# Patient Record
Sex: Female | Born: 1940
Health system: Southern US, Community
[De-identification: ages and names within clinical notes are randomized; demographics above are authoritative.]

## PROBLEM LIST (undated history)

## (undated) DIAGNOSIS — M7989 Other specified soft tissue disorders: Secondary | ICD-10-CM

## (undated) DIAGNOSIS — IMO0001 Reserved for inherently not codable concepts without codable children: Secondary | ICD-10-CM

## (undated) DIAGNOSIS — I10 Essential (primary) hypertension: Secondary | ICD-10-CM

## (undated) DIAGNOSIS — N289 Disorder of kidney and ureter, unspecified: Secondary | ICD-10-CM

## (undated) DIAGNOSIS — M1990 Unspecified osteoarthritis, unspecified site: Secondary | ICD-10-CM

## (undated) DIAGNOSIS — F329 Major depressive disorder, single episode, unspecified: Secondary | ICD-10-CM

## (undated) DIAGNOSIS — F32A Depression, unspecified: Secondary | ICD-10-CM

## (undated) DIAGNOSIS — K469 Unspecified abdominal hernia without obstruction or gangrene: Secondary | ICD-10-CM

## (undated) DIAGNOSIS — Z8719 Personal history of other diseases of the digestive system: Secondary | ICD-10-CM

## (undated) DIAGNOSIS — D509 Iron deficiency anemia, unspecified: Secondary | ICD-10-CM

## (undated) DIAGNOSIS — E611 Iron deficiency: Secondary | ICD-10-CM

## (undated) DIAGNOSIS — N189 Chronic kidney disease, unspecified: Secondary | ICD-10-CM

## (undated) DIAGNOSIS — E785 Hyperlipidemia, unspecified: Secondary | ICD-10-CM

## (undated) DIAGNOSIS — I739 Peripheral vascular disease, unspecified: Secondary | ICD-10-CM

## (undated) DIAGNOSIS — L659 Nonscarring hair loss, unspecified: Secondary | ICD-10-CM

## (undated) DIAGNOSIS — J189 Pneumonia, unspecified organism: Secondary | ICD-10-CM

## (undated) DIAGNOSIS — I509 Heart failure, unspecified: Secondary | ICD-10-CM

## (undated) DIAGNOSIS — D631 Anemia in chronic kidney disease: Secondary | ICD-10-CM

## (undated) HISTORY — DX: Depression, unspecified: F32.A

## (undated) HISTORY — PX: HERNIA REPAIR: SHX51

## (undated) HISTORY — DX: Personal history of other diseases of the digestive system: Z87.19

## (undated) HISTORY — DX: Other specified soft tissue disorders: M79.89

## (undated) HISTORY — DX: Major depressive disorder, single episode, unspecified: F32.9

## (undated) HISTORY — DX: Nonscarring hair loss, unspecified: L65.9

## (undated) HISTORY — PX: ABDOMINAL HYSTERECTOMY: SHX81

## (undated) HISTORY — DX: Essential (primary) hypertension: I10

## (undated) HISTORY — DX: Hyperlipidemia, unspecified: E78.5

## (undated) HISTORY — DX: Chronic kidney disease, unspecified: N18.9

## (undated) HISTORY — DX: Anemia in chronic kidney disease: D63.1

---

## 1997-08-13 ENCOUNTER — Encounter: Admission: RE | Admit: 1997-08-13 | Discharge: 1997-11-11 | Payer: Self-pay | Admitting: *Deleted

## 1997-09-04 ENCOUNTER — Encounter: Admission: RE | Admit: 1997-09-04 | Discharge: 1997-09-04 | Payer: Self-pay | Admitting: Family Medicine

## 1997-11-14 ENCOUNTER — Encounter: Admission: RE | Admit: 1997-11-14 | Discharge: 1997-11-14 | Payer: Self-pay | Admitting: Family Medicine

## 1998-03-03 ENCOUNTER — Ambulatory Visit (HOSPITAL_COMMUNITY): Admission: RE | Admit: 1998-03-03 | Discharge: 1998-03-03 | Payer: Self-pay | Admitting: *Deleted

## 1998-04-17 ENCOUNTER — Encounter: Admission: RE | Admit: 1998-04-17 | Discharge: 1998-04-17 | Payer: Self-pay | Admitting: Family Medicine

## 1998-05-18 ENCOUNTER — Emergency Department (HOSPITAL_COMMUNITY): Admission: EM | Admit: 1998-05-18 | Discharge: 1998-05-18 | Payer: Self-pay | Admitting: Emergency Medicine

## 1998-05-22 ENCOUNTER — Encounter: Admission: RE | Admit: 1998-05-22 | Discharge: 1998-05-22 | Payer: Self-pay | Admitting: Family Medicine

## 1998-05-29 ENCOUNTER — Encounter: Admission: RE | Admit: 1998-05-29 | Discharge: 1998-05-29 | Payer: Self-pay | Admitting: Family Medicine

## 1998-06-09 ENCOUNTER — Encounter: Admission: RE | Admit: 1998-06-09 | Discharge: 1998-06-09 | Payer: Self-pay | Admitting: Family Medicine

## 1998-06-10 ENCOUNTER — Encounter: Admission: RE | Admit: 1998-06-10 | Discharge: 1998-06-10 | Payer: Self-pay | Admitting: Sports Medicine

## 1998-06-12 ENCOUNTER — Ambulatory Visit (HOSPITAL_COMMUNITY): Admission: RE | Admit: 1998-06-12 | Discharge: 1998-06-12 | Payer: Self-pay | Admitting: *Deleted

## 1999-01-24 ENCOUNTER — Emergency Department (HOSPITAL_COMMUNITY): Admission: EM | Admit: 1999-01-24 | Discharge: 1999-01-24 | Payer: Self-pay | Admitting: Emergency Medicine

## 1999-01-24 ENCOUNTER — Encounter: Payer: Self-pay | Admitting: Emergency Medicine

## 1999-06-01 ENCOUNTER — Emergency Department (HOSPITAL_COMMUNITY): Admission: EM | Admit: 1999-06-01 | Discharge: 1999-06-01 | Payer: Self-pay | Admitting: Emergency Medicine

## 1999-06-01 ENCOUNTER — Encounter: Payer: Self-pay | Admitting: Emergency Medicine

## 1999-06-15 ENCOUNTER — Ambulatory Visit (HOSPITAL_COMMUNITY): Admission: RE | Admit: 1999-06-15 | Discharge: 1999-06-15 | Payer: Self-pay | Admitting: Family Medicine

## 1999-06-15 ENCOUNTER — Encounter: Payer: Self-pay | Admitting: Family Medicine

## 2000-02-10 ENCOUNTER — Encounter: Payer: Self-pay | Admitting: Urology

## 2000-02-10 ENCOUNTER — Encounter: Admission: RE | Admit: 2000-02-10 | Discharge: 2000-02-10 | Payer: Self-pay | Admitting: Urology

## 2000-02-17 ENCOUNTER — Other Ambulatory Visit: Admission: RE | Admit: 2000-02-17 | Discharge: 2000-02-17 | Payer: Self-pay | Admitting: Urology

## 2000-06-16 ENCOUNTER — Encounter: Payer: Self-pay | Admitting: Family Medicine

## 2000-06-16 ENCOUNTER — Ambulatory Visit (HOSPITAL_COMMUNITY): Admission: RE | Admit: 2000-06-16 | Discharge: 2000-06-16 | Payer: Self-pay | Admitting: Family Medicine

## 2001-03-09 ENCOUNTER — Other Ambulatory Visit: Admission: RE | Admit: 2001-03-09 | Discharge: 2001-03-09 | Payer: Self-pay | Admitting: Urology

## 2001-07-12 ENCOUNTER — Ambulatory Visit (HOSPITAL_COMMUNITY): Admission: RE | Admit: 2001-07-12 | Discharge: 2001-07-12 | Payer: Self-pay | Admitting: Family Medicine

## 2001-09-13 ENCOUNTER — Ambulatory Visit (HOSPITAL_COMMUNITY): Admission: RE | Admit: 2001-09-13 | Discharge: 2001-09-13 | Payer: Self-pay | Admitting: Internal Medicine

## 2002-03-09 ENCOUNTER — Emergency Department (HOSPITAL_COMMUNITY): Admission: EM | Admit: 2002-03-09 | Discharge: 2002-03-09 | Payer: Self-pay | Admitting: Emergency Medicine

## 2002-03-09 ENCOUNTER — Encounter: Payer: Self-pay | Admitting: Emergency Medicine

## 2002-07-16 ENCOUNTER — Ambulatory Visit (HOSPITAL_COMMUNITY): Admission: RE | Admit: 2002-07-16 | Discharge: 2002-07-16 | Payer: Self-pay | Admitting: Family Medicine

## 2002-10-18 ENCOUNTER — Ambulatory Visit (HOSPITAL_COMMUNITY): Admission: RE | Admit: 2002-10-18 | Discharge: 2002-10-18 | Payer: Self-pay | Admitting: Internal Medicine

## 2002-10-31 ENCOUNTER — Emergency Department (HOSPITAL_COMMUNITY): Admission: EM | Admit: 2002-10-31 | Discharge: 2002-10-31 | Payer: Self-pay | Admitting: Emergency Medicine

## 2002-10-31 ENCOUNTER — Encounter: Payer: Self-pay | Admitting: Emergency Medicine

## 2002-11-14 ENCOUNTER — Emergency Department (HOSPITAL_COMMUNITY): Admission: EM | Admit: 2002-11-14 | Discharge: 2002-11-15 | Payer: Self-pay

## 2003-08-26 ENCOUNTER — Ambulatory Visit (HOSPITAL_COMMUNITY): Admission: RE | Admit: 2003-08-26 | Discharge: 2003-08-26 | Payer: Self-pay | Admitting: Nurse Practitioner

## 2004-01-17 ENCOUNTER — Ambulatory Visit: Payer: Self-pay | Admitting: *Deleted

## 2004-02-20 ENCOUNTER — Ambulatory Visit: Payer: Self-pay | Admitting: Nurse Practitioner

## 2004-06-03 ENCOUNTER — Ambulatory Visit: Payer: Self-pay | Admitting: Nurse Practitioner

## 2004-07-22 ENCOUNTER — Emergency Department (HOSPITAL_COMMUNITY): Admission: EM | Admit: 2004-07-22 | Discharge: 2004-07-22 | Payer: Self-pay | Admitting: Family Medicine

## 2004-07-29 ENCOUNTER — Emergency Department (HOSPITAL_COMMUNITY): Admission: EM | Admit: 2004-07-29 | Discharge: 2004-07-29 | Payer: Self-pay | Admitting: Family Medicine

## 2004-08-16 ENCOUNTER — Emergency Department (HOSPITAL_COMMUNITY): Admission: EM | Admit: 2004-08-16 | Discharge: 2004-08-16 | Payer: Self-pay | Admitting: Family Medicine

## 2004-08-17 ENCOUNTER — Ambulatory Visit: Payer: Self-pay | Admitting: Nurse Practitioner

## 2004-08-20 ENCOUNTER — Ambulatory Visit: Payer: Self-pay | Admitting: Nurse Practitioner

## 2004-08-21 ENCOUNTER — Ambulatory Visit (HOSPITAL_COMMUNITY): Admission: RE | Admit: 2004-08-21 | Discharge: 2004-08-21 | Payer: Self-pay | Admitting: Nurse Practitioner

## 2004-08-21 ENCOUNTER — Ambulatory Visit (HOSPITAL_COMMUNITY): Admission: RE | Admit: 2004-08-21 | Discharge: 2004-08-21 | Payer: Self-pay | Admitting: Internal Medicine

## 2004-08-27 ENCOUNTER — Ambulatory Visit (HOSPITAL_COMMUNITY): Admission: RE | Admit: 2004-08-27 | Discharge: 2004-08-27 | Payer: Self-pay | Admitting: Internal Medicine

## 2004-08-28 ENCOUNTER — Ambulatory Visit: Payer: Self-pay | Admitting: Family Medicine

## 2004-09-08 ENCOUNTER — Encounter: Admission: RE | Admit: 2004-09-08 | Discharge: 2004-09-08 | Payer: Self-pay | Admitting: Family Medicine

## 2004-09-09 ENCOUNTER — Encounter: Payer: Self-pay | Admitting: Cardiology

## 2004-09-09 ENCOUNTER — Ambulatory Visit (HOSPITAL_COMMUNITY): Admission: RE | Admit: 2004-09-09 | Discharge: 2004-09-09 | Payer: Self-pay | Admitting: Sports Medicine

## 2004-09-09 ENCOUNTER — Ambulatory Visit: Payer: Self-pay | Admitting: Cardiology

## 2004-09-25 ENCOUNTER — Ambulatory Visit: Payer: Self-pay | Admitting: Family Medicine

## 2004-10-30 ENCOUNTER — Ambulatory Visit: Payer: Self-pay | Admitting: Family Medicine

## 2004-12-02 ENCOUNTER — Encounter: Admission: RE | Admit: 2004-12-02 | Discharge: 2004-12-02 | Payer: Self-pay | Admitting: Family Medicine

## 2004-12-02 ENCOUNTER — Ambulatory Visit: Payer: Self-pay | Admitting: Family Medicine

## 2004-12-02 IMAGING — CR DG KNEE 1-2V*R*
2 series · 2 of 2 positions shown · non-contrast
Comparison: none

CLINICAL DATA: Lateral knee pain. 
 RIGHT KNEE ? 2 VIEW:
 Two views of the right knee show considerable loss of joint space laterally with some loss at the patellofemoral articulation as well.  There is sclerosis and spur formation primarily laterally.  No effusion is seen.

[t knee ap right]
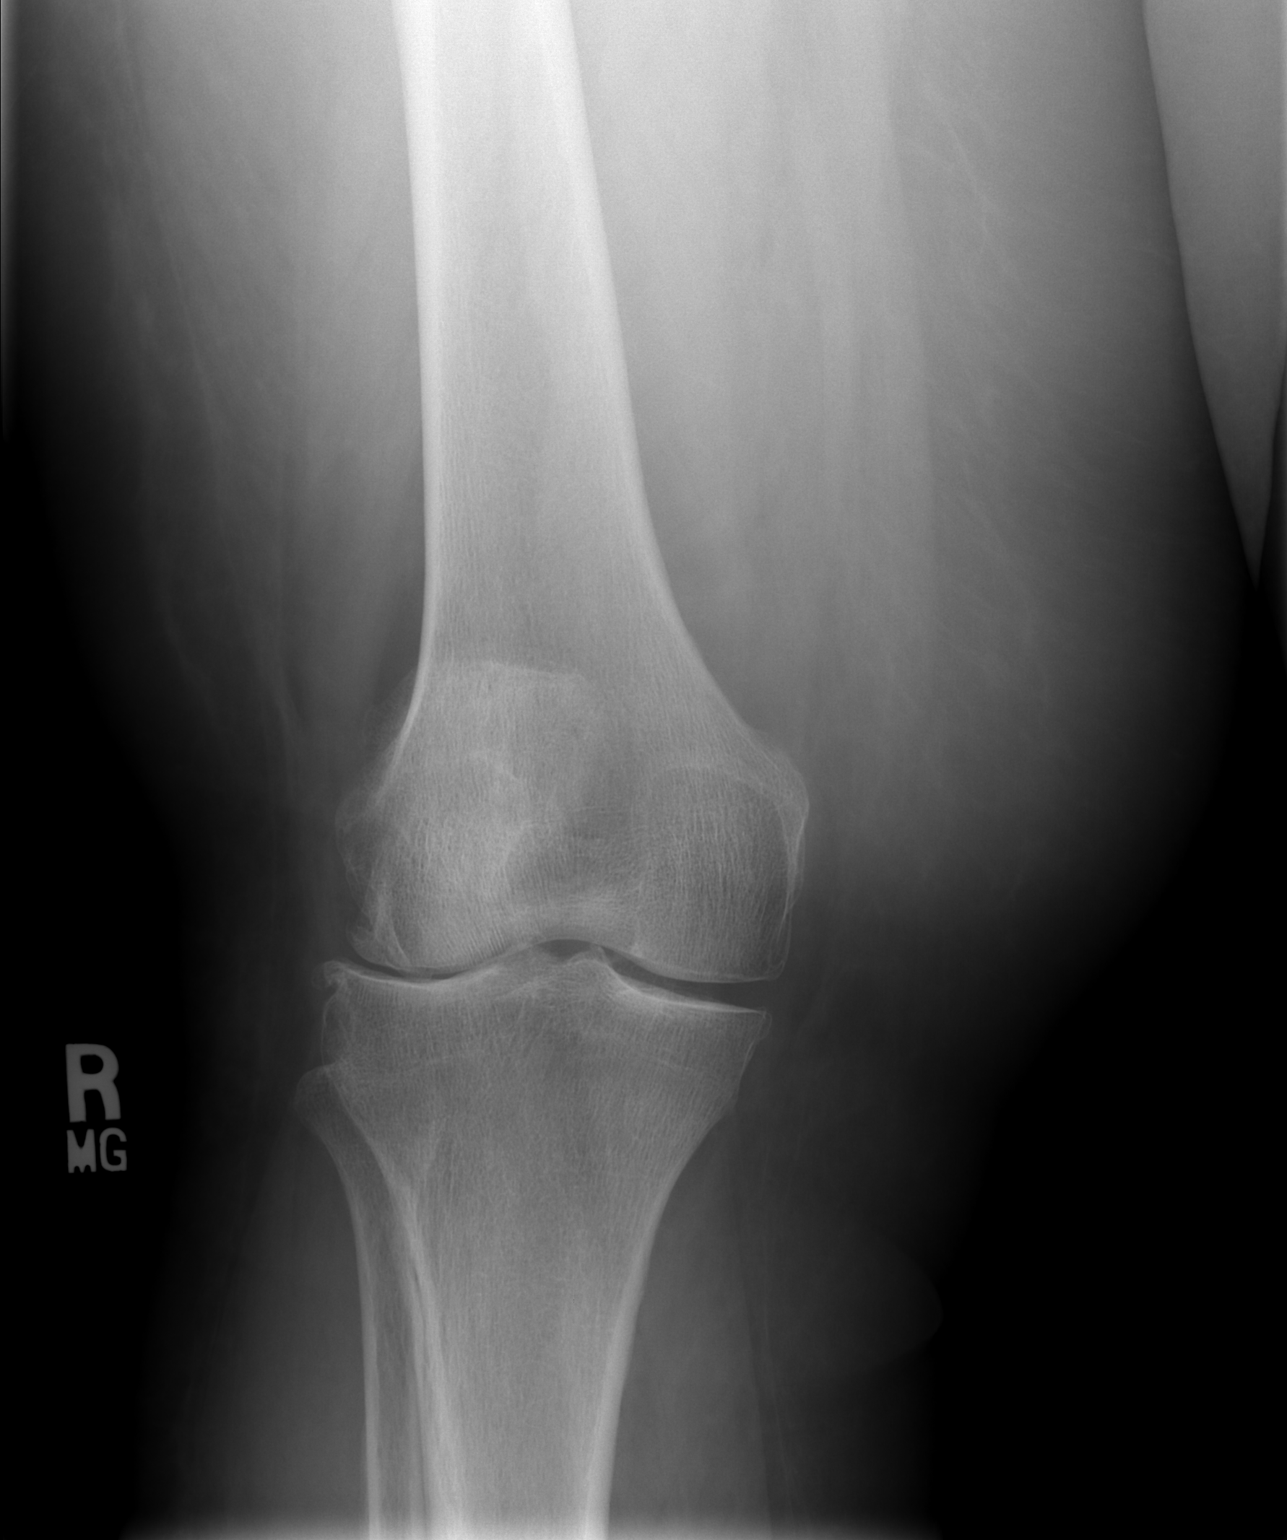

[t knee lat right]
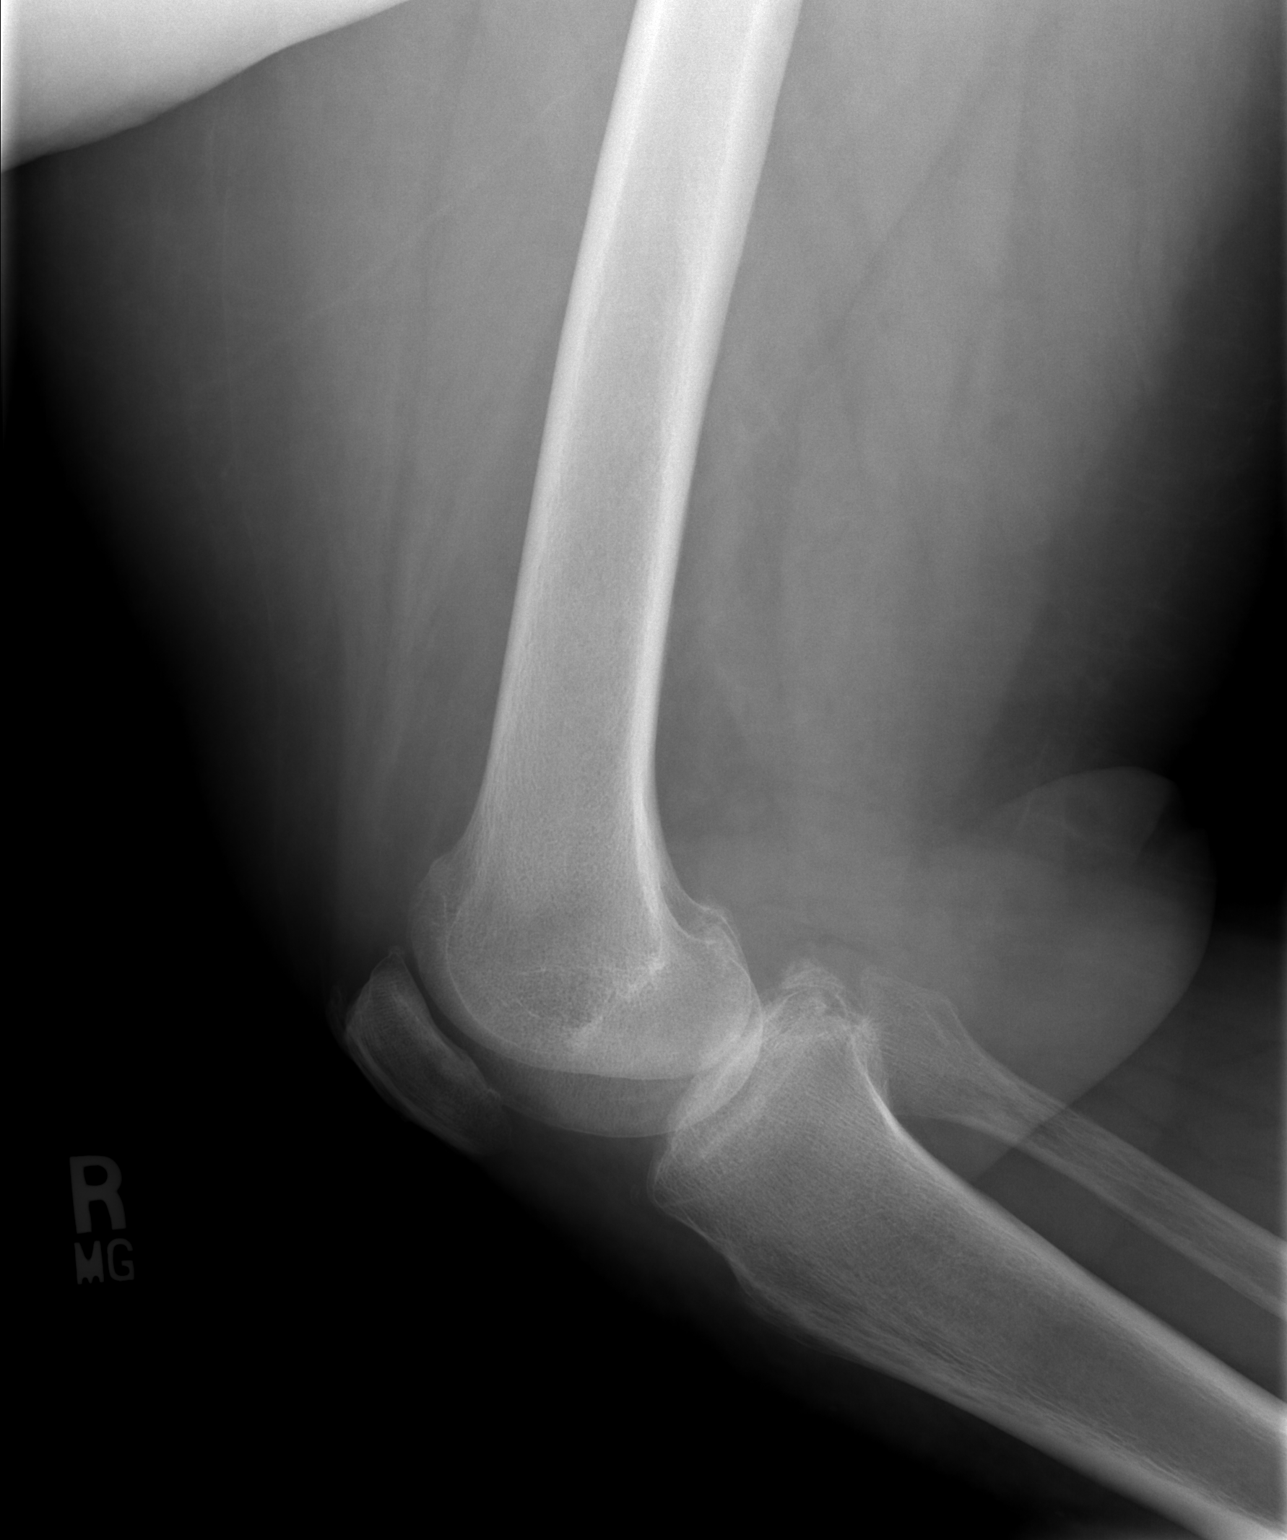

[2 of 2 positions shown; findings below may reference images not displayed]

IMPRESSION: Degenerative changes particularly laterally.  No acute abnormality.

## 2004-12-24 ENCOUNTER — Ambulatory Visit: Payer: Self-pay | Admitting: Family Medicine

## 2005-01-13 ENCOUNTER — Ambulatory Visit: Payer: Self-pay | Admitting: Sports Medicine

## 2005-01-26 ENCOUNTER — Ambulatory Visit: Payer: Self-pay | Admitting: Sports Medicine

## 2005-02-15 ENCOUNTER — Ambulatory Visit: Payer: Self-pay | Admitting: Sports Medicine

## 2005-03-17 ENCOUNTER — Ambulatory Visit: Payer: Self-pay | Admitting: Family Medicine

## 2005-04-28 ENCOUNTER — Ambulatory Visit: Payer: Self-pay | Admitting: Family Medicine

## 2005-05-12 ENCOUNTER — Ambulatory Visit: Payer: Self-pay | Admitting: Family Medicine

## 2005-05-28 ENCOUNTER — Ambulatory Visit: Payer: Self-pay | Admitting: Family Medicine

## 2005-06-23 ENCOUNTER — Ambulatory Visit: Payer: Self-pay | Admitting: Family Medicine

## 2005-07-02 ENCOUNTER — Ambulatory Visit: Payer: Self-pay | Admitting: Family Medicine

## 2005-07-07 ENCOUNTER — Ambulatory Visit: Payer: Self-pay | Admitting: Family Medicine

## 2005-07-08 ENCOUNTER — Ambulatory Visit: Payer: Self-pay | Admitting: Family Medicine

## 2005-07-26 ENCOUNTER — Ambulatory Visit: Payer: Self-pay | Admitting: Family Medicine

## 2005-08-02 ENCOUNTER — Ambulatory Visit: Payer: Self-pay | Admitting: Family Medicine

## 2005-08-13 ENCOUNTER — Ambulatory Visit: Payer: Self-pay | Admitting: Family Medicine

## 2005-09-15 ENCOUNTER — Ambulatory Visit: Payer: Self-pay | Admitting: Family Medicine

## 2005-09-23 ENCOUNTER — Emergency Department (HOSPITAL_COMMUNITY): Admission: EM | Admit: 2005-09-23 | Discharge: 2005-09-23 | Payer: Self-pay | Admitting: Emergency Medicine

## 2005-09-27 ENCOUNTER — Ambulatory Visit: Payer: Self-pay | Admitting: Sports Medicine

## 2005-09-29 ENCOUNTER — Ambulatory Visit (HOSPITAL_COMMUNITY): Admission: RE | Admit: 2005-09-29 | Discharge: 2005-09-29 | Payer: Self-pay | Admitting: Family Medicine

## 2005-09-30 ENCOUNTER — Ambulatory Visit: Payer: Self-pay | Admitting: Family Medicine

## 2005-11-10 ENCOUNTER — Ambulatory Visit: Payer: Self-pay | Admitting: Sports Medicine

## 2005-11-17 ENCOUNTER — Ambulatory Visit: Payer: Self-pay | Admitting: Family Medicine

## 2005-11-18 ENCOUNTER — Encounter: Admission: RE | Admit: 2005-11-18 | Discharge: 2005-11-18 | Payer: Self-pay | Admitting: Sports Medicine

## 2005-11-25 ENCOUNTER — Ambulatory Visit: Payer: Self-pay | Admitting: Family Medicine

## 2005-11-25 ENCOUNTER — Ambulatory Visit (HOSPITAL_COMMUNITY): Admission: RE | Admit: 2005-11-25 | Discharge: 2005-11-25 | Payer: Self-pay | Admitting: Family Medicine

## 2005-12-09 ENCOUNTER — Encounter: Admission: RE | Admit: 2005-12-09 | Discharge: 2005-12-09 | Payer: Self-pay | Admitting: Pediatrics

## 2005-12-15 ENCOUNTER — Ambulatory Visit: Payer: Self-pay | Admitting: Family Medicine

## 2006-01-06 ENCOUNTER — Encounter: Admission: RE | Admit: 2006-01-06 | Discharge: 2006-01-06 | Payer: Self-pay | Admitting: Sports Medicine

## 2006-02-11 ENCOUNTER — Ambulatory Visit: Payer: Self-pay | Admitting: Family Medicine

## 2006-03-03 ENCOUNTER — Ambulatory Visit: Payer: Self-pay | Admitting: Sports Medicine

## 2006-03-29 ENCOUNTER — Ambulatory Visit: Payer: Self-pay | Admitting: Sports Medicine

## 2006-04-13 ENCOUNTER — Ambulatory Visit: Payer: Self-pay | Admitting: Family Medicine

## 2006-04-25 ENCOUNTER — Encounter: Admission: RE | Admit: 2006-04-25 | Discharge: 2006-04-25 | Payer: Self-pay | Admitting: Nephrology

## 2006-04-28 ENCOUNTER — Ambulatory Visit: Payer: Self-pay | Admitting: Family Medicine

## 2006-05-19 ENCOUNTER — Ambulatory Visit: Payer: Self-pay | Admitting: Family Medicine

## 2006-06-09 ENCOUNTER — Ambulatory Visit: Payer: Self-pay | Admitting: Family Medicine

## 2006-06-16 ENCOUNTER — Encounter (INDEPENDENT_AMBULATORY_CARE_PROVIDER_SITE_OTHER): Payer: Self-pay | Admitting: Family Medicine

## 2006-06-16 ENCOUNTER — Ambulatory Visit: Payer: Self-pay | Admitting: Family Medicine

## 2006-06-16 DIAGNOSIS — I871 Compression of vein: Secondary | ICD-10-CM

## 2006-06-16 DIAGNOSIS — N189 Chronic kidney disease, unspecified: Secondary | ICD-10-CM

## 2006-06-16 DIAGNOSIS — D631 Anemia in chronic kidney disease: Secondary | ICD-10-CM

## 2006-06-16 DIAGNOSIS — I10 Essential (primary) hypertension: Secondary | ICD-10-CM

## 2006-06-16 DIAGNOSIS — N19 Unspecified kidney failure: Secondary | ICD-10-CM | POA: Insufficient documentation

## 2006-06-16 DIAGNOSIS — E118 Type 2 diabetes mellitus with unspecified complications: Secondary | ICD-10-CM

## 2006-06-16 DIAGNOSIS — E785 Hyperlipidemia, unspecified: Secondary | ICD-10-CM

## 2006-06-16 HISTORY — DX: Anemia in chronic kidney disease: D63.1

## 2006-06-16 HISTORY — DX: Chronic kidney disease, unspecified: N18.9

## 2006-06-16 LAB — CONVERTED CEMR LAB
HDL: 61 mg/dL (ref >39)
LDL Cholesterol: 74 mg/dL (ref 0–99)
Total CHOL/HDL Ratio: 2.5
Triglycerides: 87 mg/dL (ref <150)

## 2006-07-11 ENCOUNTER — Telehealth (INDEPENDENT_AMBULATORY_CARE_PROVIDER_SITE_OTHER): Payer: Self-pay | Admitting: Family Medicine

## 2006-07-14 ENCOUNTER — Ambulatory Visit: Payer: Self-pay | Admitting: Sports Medicine

## 2006-07-14 LAB — CONVERTED CEMR LAB: Cholesterol, target level: 200 mg/dL

## 2006-08-17 ENCOUNTER — Ambulatory Visit: Payer: Self-pay | Admitting: Family Medicine

## 2006-08-17 ENCOUNTER — Encounter (INDEPENDENT_AMBULATORY_CARE_PROVIDER_SITE_OTHER): Payer: Self-pay | Admitting: Family Medicine

## 2006-08-17 DIAGNOSIS — G5600 Carpal tunnel syndrome, unspecified upper limb: Secondary | ICD-10-CM | POA: Insufficient documentation

## 2006-08-17 DIAGNOSIS — H409 Unspecified glaucoma: Secondary | ICD-10-CM | POA: Insufficient documentation

## 2006-08-17 DIAGNOSIS — L659 Nonscarring hair loss, unspecified: Secondary | ICD-10-CM | POA: Insufficient documentation

## 2006-08-17 LAB — CONVERTED CEMR LAB
BUN: 21 mg/dL (ref 6–23)
Calcium: 9.1 mg/dL (ref 8.4–10.5)
HCT: 32.9 %
Hemoglobin: 11.1 g/dL
MCV: 89 fL
Potassium: 4.4 meq/L (ref 3.5–5.3)
Sodium: 140 meq/L (ref 135–145)
TSH: 3.066 microintl units/mL (ref 0.350–5.50)
Vitamin B-12: 418 pg/mL (ref 211–911)

## 2006-08-18 ENCOUNTER — Encounter (INDEPENDENT_AMBULATORY_CARE_PROVIDER_SITE_OTHER): Payer: Self-pay | Admitting: Family Medicine

## 2006-08-25 ENCOUNTER — Ambulatory Visit: Payer: Self-pay | Admitting: Family Medicine

## 2006-08-29 ENCOUNTER — Ambulatory Visit: Payer: Self-pay | Admitting: Family Medicine

## 2006-09-05 ENCOUNTER — Encounter (INDEPENDENT_AMBULATORY_CARE_PROVIDER_SITE_OTHER): Payer: Self-pay | Admitting: Hospitalist

## 2006-09-05 ENCOUNTER — Telehealth: Payer: Self-pay | Admitting: *Deleted

## 2006-09-16 ENCOUNTER — Telehealth (INDEPENDENT_AMBULATORY_CARE_PROVIDER_SITE_OTHER): Payer: Self-pay | Admitting: Family Medicine

## 2006-09-19 ENCOUNTER — Ambulatory Visit: Payer: Self-pay | Admitting: Family Medicine

## 2006-09-19 ENCOUNTER — Telehealth (INDEPENDENT_AMBULATORY_CARE_PROVIDER_SITE_OTHER): Payer: Self-pay | Admitting: *Deleted

## 2006-09-21 ENCOUNTER — Encounter: Admission: RE | Admit: 2006-09-21 | Discharge: 2006-09-21 | Payer: Self-pay | Admitting: Sports Medicine

## 2006-09-29 ENCOUNTER — Ambulatory Visit: Payer: Self-pay | Admitting: Sports Medicine

## 2006-10-03 ENCOUNTER — Encounter (INDEPENDENT_AMBULATORY_CARE_PROVIDER_SITE_OTHER): Payer: Self-pay | Admitting: Family Medicine

## 2006-10-05 ENCOUNTER — Encounter (INDEPENDENT_AMBULATORY_CARE_PROVIDER_SITE_OTHER): Payer: Self-pay | Admitting: Family Medicine

## 2006-10-05 ENCOUNTER — Ambulatory Visit (HOSPITAL_COMMUNITY): Admission: RE | Admit: 2006-10-05 | Discharge: 2006-10-05 | Payer: Self-pay | Admitting: Sports Medicine

## 2006-10-13 ENCOUNTER — Encounter (INDEPENDENT_AMBULATORY_CARE_PROVIDER_SITE_OTHER): Payer: Self-pay | Admitting: Family Medicine

## 2006-10-13 ENCOUNTER — Emergency Department (HOSPITAL_COMMUNITY): Admission: EM | Admit: 2006-10-13 | Discharge: 2006-10-13 | Payer: Self-pay | Admitting: Emergency Medicine

## 2006-10-14 ENCOUNTER — Telehealth (INDEPENDENT_AMBULATORY_CARE_PROVIDER_SITE_OTHER): Payer: Self-pay | Admitting: Family Medicine

## 2006-10-14 ENCOUNTER — Encounter (INDEPENDENT_AMBULATORY_CARE_PROVIDER_SITE_OTHER): Payer: Self-pay | Admitting: Family Medicine

## 2006-10-19 ENCOUNTER — Ambulatory Visit: Payer: Self-pay | Admitting: Family Medicine

## 2006-10-27 ENCOUNTER — Encounter (INDEPENDENT_AMBULATORY_CARE_PROVIDER_SITE_OTHER): Payer: Self-pay | Admitting: Family Medicine

## 2006-11-21 ENCOUNTER — Encounter (INDEPENDENT_AMBULATORY_CARE_PROVIDER_SITE_OTHER): Payer: Self-pay | Admitting: Family Medicine

## 2006-11-24 ENCOUNTER — Ambulatory Visit: Payer: Self-pay | Admitting: Family Medicine

## 2006-12-01 ENCOUNTER — Ambulatory Visit: Payer: Self-pay | Admitting: Family Medicine

## 2006-12-08 ENCOUNTER — Ambulatory Visit: Payer: Self-pay | Admitting: Family Medicine

## 2006-12-08 DIAGNOSIS — M1990 Unspecified osteoarthritis, unspecified site: Secondary | ICD-10-CM | POA: Insufficient documentation

## 2006-12-08 LAB — CONVERTED CEMR LAB: Hgb A1c MFr Bld: 7.5 %

## 2006-12-28 ENCOUNTER — Encounter (INDEPENDENT_AMBULATORY_CARE_PROVIDER_SITE_OTHER): Payer: Self-pay | Admitting: Family Medicine

## 2007-01-05 ENCOUNTER — Ambulatory Visit: Payer: Self-pay | Admitting: Family Medicine

## 2007-01-05 ENCOUNTER — Encounter (INDEPENDENT_AMBULATORY_CARE_PROVIDER_SITE_OTHER): Payer: Self-pay | Admitting: Family Medicine

## 2007-01-09 ENCOUNTER — Encounter (INDEPENDENT_AMBULATORY_CARE_PROVIDER_SITE_OTHER): Payer: Self-pay | Admitting: Family Medicine

## 2007-01-16 ENCOUNTER — Ambulatory Visit: Payer: Self-pay | Admitting: Family Medicine

## 2007-01-16 LAB — CONVERTED CEMR LAB

## 2007-01-23 ENCOUNTER — Emergency Department (HOSPITAL_COMMUNITY): Admission: EM | Admit: 2007-01-23 | Discharge: 2007-01-23 | Payer: Self-pay | Admitting: Emergency Medicine

## 2007-01-30 ENCOUNTER — Inpatient Hospital Stay (HOSPITAL_COMMUNITY): Admission: EM | Admit: 2007-01-30 | Discharge: 2007-02-13 | Payer: Self-pay | Admitting: Emergency Medicine

## 2007-01-30 ENCOUNTER — Encounter (INDEPENDENT_AMBULATORY_CARE_PROVIDER_SITE_OTHER): Payer: Self-pay | Admitting: Family Medicine

## 2007-02-14 ENCOUNTER — Emergency Department (HOSPITAL_COMMUNITY): Admission: EM | Admit: 2007-02-14 | Discharge: 2007-02-15 | Payer: Self-pay | Admitting: Emergency Medicine

## 2007-02-15 ENCOUNTER — Inpatient Hospital Stay (HOSPITAL_COMMUNITY): Admission: EM | Admit: 2007-02-15 | Discharge: 2007-02-21 | Payer: Self-pay | Admitting: Emergency Medicine

## 2007-02-15 ENCOUNTER — Encounter: Payer: Self-pay | Admitting: Family Medicine

## 2007-02-15 DIAGNOSIS — Z8719 Personal history of other diseases of the digestive system: Secondary | ICD-10-CM

## 2007-02-15 HISTORY — DX: Personal history of other diseases of the digestive system: Z87.19

## 2007-02-22 ENCOUNTER — Encounter (INDEPENDENT_AMBULATORY_CARE_PROVIDER_SITE_OTHER): Payer: Self-pay | Admitting: *Deleted

## 2007-03-01 ENCOUNTER — Encounter: Payer: Self-pay | Admitting: Family Medicine

## 2007-03-01 LAB — CONVERTED CEMR LAB
Time of Second Serial CBG: 1700
Value of Second Serial CBG: 239

## 2007-03-08 ENCOUNTER — Ambulatory Visit: Payer: Self-pay | Admitting: Family Medicine

## 2007-03-08 ENCOUNTER — Encounter: Payer: Self-pay | Admitting: Family Medicine

## 2007-03-24 ENCOUNTER — Telehealth: Payer: Self-pay | Admitting: *Deleted

## 2007-03-30 ENCOUNTER — Encounter (INDEPENDENT_AMBULATORY_CARE_PROVIDER_SITE_OTHER): Payer: Self-pay | Admitting: Family Medicine

## 2007-03-30 ENCOUNTER — Ambulatory Visit: Payer: Self-pay | Admitting: Family Medicine

## 2007-03-30 LAB — CONVERTED CEMR LAB
BUN: 32 mg/dL — ABNORMAL HIGH (ref 6–23)
CO2: 26 meq/L (ref 19–32)
Glucose, Bld: 126 mg/dL — ABNORMAL HIGH (ref 70–99)
Hemoglobin: 10.6 g/dL — ABNORMAL LOW (ref 12.0–15.0)
MCHC: 31 g/dL (ref 30.0–36.0)
Potassium: 4.7 meq/L (ref 3.5–5.3)
RBC: 3.65 M/uL — ABNORMAL LOW (ref 3.87–5.11)
Sodium: 141 meq/L (ref 135–145)
WBC: 7.1 10*3/uL (ref 4.0–10.5)

## 2007-04-05 ENCOUNTER — Encounter (INDEPENDENT_AMBULATORY_CARE_PROVIDER_SITE_OTHER): Payer: Self-pay | Admitting: Family Medicine

## 2007-05-01 ENCOUNTER — Encounter (INDEPENDENT_AMBULATORY_CARE_PROVIDER_SITE_OTHER): Payer: Self-pay | Admitting: *Deleted

## 2007-05-16 ENCOUNTER — Encounter: Admission: RE | Admit: 2007-05-16 | Discharge: 2007-05-16 | Payer: Self-pay | Admitting: Endocrinology

## 2007-06-27 ENCOUNTER — Encounter (INDEPENDENT_AMBULATORY_CARE_PROVIDER_SITE_OTHER): Payer: Self-pay | Admitting: Family Medicine

## 2007-07-10 ENCOUNTER — Ambulatory Visit: Payer: Self-pay | Admitting: Sports Medicine

## 2007-07-10 ENCOUNTER — Encounter (INDEPENDENT_AMBULATORY_CARE_PROVIDER_SITE_OTHER): Payer: Self-pay | Admitting: Family Medicine

## 2007-07-10 DIAGNOSIS — K458 Other specified abdominal hernia without obstruction or gangrene: Secondary | ICD-10-CM

## 2007-07-10 LAB — CONVERTED CEMR LAB
HCT: 33.7 % — ABNORMAL LOW (ref 36.0–46.0)
Hemoglobin: 10.9 g/dL — ABNORMAL LOW (ref 12.0–15.0)
Hgb A1c MFr Bld: 9.2 %
MCHC: 32.3 g/dL (ref 30.0–36.0)
MCV: 91.6 fL (ref 78.0–100.0)
Platelets: 280 10*3/uL (ref 150–400)
RDW: 13.8 % (ref 11.5–15.5)

## 2007-07-12 ENCOUNTER — Ambulatory Visit: Admission: RE | Admit: 2007-07-12 | Discharge: 2007-07-12 | Payer: Self-pay | Admitting: Family Medicine

## 2007-07-12 ENCOUNTER — Ambulatory Visit: Payer: Self-pay | Admitting: Vascular Surgery

## 2007-07-13 ENCOUNTER — Ambulatory Visit: Payer: Self-pay | Admitting: Family Medicine

## 2007-08-04 ENCOUNTER — Ambulatory Visit: Payer: Self-pay | Admitting: Family Medicine

## 2007-08-17 ENCOUNTER — Encounter (INDEPENDENT_AMBULATORY_CARE_PROVIDER_SITE_OTHER): Payer: Self-pay | Admitting: Family Medicine

## 2007-09-07 ENCOUNTER — Encounter: Payer: Self-pay | Admitting: Family Medicine

## 2007-09-07 ENCOUNTER — Ambulatory Visit: Payer: Self-pay | Admitting: Family Medicine

## 2007-10-06 ENCOUNTER — Ambulatory Visit: Payer: Self-pay | Admitting: Family Medicine

## 2007-10-18 ENCOUNTER — Encounter: Payer: Self-pay | Admitting: Family Medicine

## 2007-10-19 ENCOUNTER — Encounter: Payer: Self-pay | Admitting: Family Medicine

## 2007-10-19 ENCOUNTER — Ambulatory Visit (HOSPITAL_COMMUNITY): Admission: RE | Admit: 2007-10-19 | Discharge: 2007-10-19 | Payer: Self-pay | Admitting: Family Medicine

## 2007-10-24 ENCOUNTER — Ambulatory Visit: Payer: Self-pay | Admitting: Family Medicine

## 2007-12-15 ENCOUNTER — Encounter: Admission: RE | Admit: 2007-12-15 | Discharge: 2007-12-15 | Payer: Self-pay | Admitting: General Surgery

## 2007-12-15 ENCOUNTER — Encounter: Payer: Self-pay | Admitting: *Deleted

## 2008-01-23 ENCOUNTER — Encounter: Payer: Self-pay | Admitting: Family Medicine

## 2008-01-24 ENCOUNTER — Telehealth: Payer: Self-pay | Admitting: *Deleted

## 2008-01-29 ENCOUNTER — Inpatient Hospital Stay (HOSPITAL_COMMUNITY): Admission: AD | Admit: 2008-01-29 | Discharge: 2008-02-04 | Payer: Self-pay | Admitting: General Surgery

## 2008-01-29 ENCOUNTER — Encounter (HOSPITAL_BASED_OUTPATIENT_CLINIC_OR_DEPARTMENT_OTHER): Payer: Self-pay | Admitting: General Surgery

## 2008-01-30 ENCOUNTER — Encounter: Payer: Self-pay | Admitting: Family Medicine

## 2008-01-30 LAB — CONVERTED CEMR LAB: Hgb A1c MFr Bld: 9.5 %

## 2008-02-12 ENCOUNTER — Telehealth: Payer: Self-pay | Admitting: Family Medicine

## 2008-02-23 ENCOUNTER — Ambulatory Visit: Payer: Self-pay | Admitting: Family Medicine

## 2008-02-23 ENCOUNTER — Telehealth: Payer: Self-pay | Admitting: *Deleted

## 2008-03-06 ENCOUNTER — Encounter: Payer: Self-pay | Admitting: Family Medicine

## 2008-04-16 ENCOUNTER — Encounter: Payer: Self-pay | Admitting: Family Medicine

## 2008-04-22 ENCOUNTER — Encounter: Payer: Self-pay | Admitting: Family Medicine

## 2008-04-22 LAB — CONVERTED CEMR LAB
Albumin: 4 g/dL
Cholesterol: 149 mg/dL
Ferritin: 30 ng/mL
HDL: 58 mg/dL
Hgb A1c MFr Bld: 8.5 %
Iron: 56 ug/dL
PTH: 52 pg/mL
Saturation Ratios: 21 %
Triglyceride fasting, serum: 188 mg/dL

## 2008-04-30 ENCOUNTER — Telehealth: Payer: Self-pay | Admitting: *Deleted

## 2008-04-30 ENCOUNTER — Encounter: Payer: Self-pay | Admitting: Family Medicine

## 2008-05-01 ENCOUNTER — Ambulatory Visit: Payer: Self-pay | Admitting: Family Medicine

## 2008-05-10 ENCOUNTER — Encounter: Payer: Self-pay | Admitting: Family Medicine

## 2008-06-05 ENCOUNTER — Encounter: Payer: Self-pay | Admitting: Family Medicine

## 2008-06-21 ENCOUNTER — Telehealth: Payer: Self-pay | Admitting: Family Medicine

## 2008-06-21 ENCOUNTER — Telehealth: Payer: Self-pay | Admitting: *Deleted

## 2008-07-24 ENCOUNTER — Telehealth: Payer: Self-pay | Admitting: Family Medicine

## 2008-07-24 ENCOUNTER — Ambulatory Visit: Payer: Self-pay | Admitting: Family Medicine

## 2008-07-24 LAB — CONVERTED CEMR LAB: Hgb A1c MFr Bld: 11.1 %

## 2008-07-29 ENCOUNTER — Encounter: Payer: Self-pay | Admitting: Family Medicine

## 2008-08-14 ENCOUNTER — Ambulatory Visit: Payer: Self-pay | Admitting: Family Medicine

## 2008-08-26 ENCOUNTER — Encounter: Payer: Self-pay | Admitting: Family Medicine

## 2008-08-26 LAB — CONVERTED CEMR LAB
Chloride, Serum: 100 mmol/L
Creatinine, Ser: 1.51 mg/dL
Hemoglobin: 10.5 g/dL
PTH: 73 pg/mL
Phosphorus: 3.6 mg/dL
Potassium, serum: 4.5 mmol/L

## 2008-08-29 ENCOUNTER — Encounter: Payer: Self-pay | Admitting: Family Medicine

## 2008-10-04 ENCOUNTER — Telehealth: Payer: Self-pay | Admitting: *Deleted

## 2008-10-10 ENCOUNTER — Encounter: Admission: RE | Admit: 2008-10-10 | Discharge: 2008-10-10 | Payer: Self-pay | Admitting: Surgery

## 2008-10-30 ENCOUNTER — Ambulatory Visit (HOSPITAL_COMMUNITY): Admission: RE | Admit: 2008-10-30 | Discharge: 2008-10-30 | Payer: Self-pay | Admitting: Family Medicine

## 2008-10-30 ENCOUNTER — Encounter: Payer: Self-pay | Admitting: Family Medicine

## 2008-12-03 ENCOUNTER — Encounter: Payer: Self-pay | Admitting: Family Medicine

## 2008-12-03 ENCOUNTER — Ambulatory Visit: Payer: Self-pay | Admitting: Family Medicine

## 2008-12-03 DIAGNOSIS — R609 Edema, unspecified: Secondary | ICD-10-CM

## 2008-12-03 HISTORY — DX: Edema, unspecified: R60.9

## 2008-12-04 LAB — CONVERTED CEMR LAB
ALT: 11 units/L (ref 0–35)
Albumin: 3.7 g/dL (ref 3.5–5.2)
Alkaline Phosphatase: 141 units/L — ABNORMAL HIGH (ref 39–117)
CO2: 23 meq/L (ref 19–32)
MCHC: 31 g/dL (ref 30.0–36.0)
MCV: 91.6 fL (ref 78.0–100.0)
Platelets: 338 10*3/uL (ref 150–400)
Potassium: 4.6 meq/L (ref 3.5–5.3)
Sodium: 135 meq/L (ref 135–145)
Total Bilirubin: 0.3 mg/dL (ref 0.3–1.2)
Total Protein: 6.9 g/dL (ref 6.0–8.3)
WBC: 7 10*3/uL (ref 4.0–10.5)

## 2008-12-10 ENCOUNTER — Telehealth: Payer: Self-pay | Admitting: *Deleted

## 2008-12-11 ENCOUNTER — Ambulatory Visit: Payer: Self-pay | Admitting: Family Medicine

## 2008-12-11 ENCOUNTER — Encounter: Payer: Self-pay | Admitting: Family Medicine

## 2008-12-11 DIAGNOSIS — M79609 Pain in unspecified limb: Secondary | ICD-10-CM

## 2008-12-11 LAB — CONVERTED CEMR LAB
RBC / HPF: NEGATIVE
Specific Gravity, Urine: 1.025
Urobilinogen, UA: 0.2

## 2008-12-13 LAB — CONVERTED CEMR LAB
Basophils Relative: 0 % (ref 0–1)
CO2: 25 meq/L (ref 19–32)
Calcium: 9.1 mg/dL (ref 8.4–10.5)
Lymphocytes Relative: 28 % (ref 12–46)
Lymphs Abs: 2.3 10*3/uL (ref 0.7–4.0)
MCHC: 32.2 g/dL (ref 30.0–36.0)
Monocytes Relative: 9 % (ref 3–12)
Neutro Abs: 5 10*3/uL (ref 1.7–7.7)
Neutrophils Relative %: 62 % (ref 43–77)
Potassium: 4.1 meq/L (ref 3.5–5.3)
RBC: 3.79 M/uL — ABNORMAL LOW (ref 3.87–5.11)
Sodium: 137 meq/L (ref 135–145)
WBC: 8.1 10*3/uL (ref 4.0–10.5)

## 2008-12-26 ENCOUNTER — Ambulatory Visit: Payer: Self-pay | Admitting: Family Medicine

## 2008-12-26 ENCOUNTER — Encounter: Payer: Self-pay | Admitting: Family Medicine

## 2008-12-27 LAB — CONVERTED CEMR LAB
Glucose, Bld: 286 mg/dL — ABNORMAL HIGH (ref 70–99)
Potassium: 4.6 meq/L (ref 3.5–5.3)
Sodium: 139 meq/L (ref 135–145)

## 2009-01-13 ENCOUNTER — Telehealth: Payer: Self-pay | Admitting: Family Medicine

## 2009-02-24 ENCOUNTER — Encounter (INDEPENDENT_AMBULATORY_CARE_PROVIDER_SITE_OTHER): Payer: Self-pay | Admitting: Family Medicine

## 2009-04-23 ENCOUNTER — Encounter: Payer: Self-pay | Admitting: Family Medicine

## 2009-04-23 ENCOUNTER — Ambulatory Visit: Payer: Self-pay | Admitting: Family Medicine

## 2009-04-23 LAB — CONVERTED CEMR LAB
BUN: 24 mg/dL — ABNORMAL HIGH (ref 6–23)
CO2: 23 meq/L (ref 19–32)
Calcium: 7.9 mg/dL — ABNORMAL LOW (ref 8.4–10.5)
Chloride: 109 meq/L (ref 96–112)
Cholesterol: 146 mg/dL (ref 0–200)
Creatinine, Ser: 1.93 mg/dL — ABNORMAL HIGH (ref 0.40–1.20)
HCT: 31.5 % — ABNORMAL LOW (ref 36.0–46.0)
HDL: 68 mg/dL (ref >39)
Hemoglobin: 10.2 g/dL — ABNORMAL LOW (ref 12.0–15.0)
Hgb A1c MFr Bld: 9 %
RDW: 14.3 % (ref 11.5–15.5)
Total CHOL/HDL Ratio: 2.1
Triglycerides: 92 mg/dL (ref <150)
WBC: 7.2 10*3/uL (ref 4.0–10.5)

## 2009-06-14 ENCOUNTER — Emergency Department (HOSPITAL_COMMUNITY): Admission: EM | Admit: 2009-06-14 | Discharge: 2009-06-14 | Payer: Self-pay | Admitting: Family Medicine

## 2009-06-14 ENCOUNTER — Emergency Department (HOSPITAL_COMMUNITY): Admission: EM | Admit: 2009-06-14 | Discharge: 2009-06-14 | Payer: Self-pay | Admitting: Emergency Medicine

## 2009-06-23 ENCOUNTER — Encounter: Payer: Self-pay | Admitting: Family Medicine

## 2009-07-05 ENCOUNTER — Emergency Department (HOSPITAL_COMMUNITY): Admission: EM | Admit: 2009-07-05 | Discharge: 2009-07-05 | Payer: Self-pay | Admitting: Family Medicine

## 2009-08-05 ENCOUNTER — Telehealth: Payer: Self-pay | Admitting: Family Medicine

## 2009-08-05 ENCOUNTER — Emergency Department (HOSPITAL_COMMUNITY): Admission: EM | Admit: 2009-08-05 | Discharge: 2009-08-05 | Payer: Self-pay | Admitting: Family Medicine

## 2009-08-15 ENCOUNTER — Encounter: Payer: Self-pay | Admitting: Family Medicine

## 2009-08-15 ENCOUNTER — Ambulatory Visit: Payer: Self-pay | Admitting: Family Medicine

## 2009-08-17 LAB — CONVERTED CEMR LAB
CO2: 24 meq/L (ref 19–32)
Calcium: 8.4 mg/dL (ref 8.4–10.5)
Creatinine, Ser: 2.13 mg/dL — ABNORMAL HIGH (ref 0.40–1.20)
Glucose, Bld: 127 mg/dL — ABNORMAL HIGH (ref 70–99)

## 2009-08-28 ENCOUNTER — Telehealth: Payer: Self-pay | Admitting: Family Medicine

## 2009-09-04 ENCOUNTER — Telehealth: Payer: Self-pay | Admitting: Family Medicine

## 2009-09-24 ENCOUNTER — Encounter: Payer: Self-pay | Admitting: Family Medicine

## 2009-09-26 ENCOUNTER — Encounter: Payer: Self-pay | Admitting: Family Medicine

## 2009-09-26 ENCOUNTER — Ambulatory Visit: Payer: Self-pay | Admitting: Family Medicine

## 2009-09-26 LAB — CONVERTED CEMR LAB
ALT: 15 units/L (ref 0–35)
AST: 13 units/L (ref 0–37)
Alkaline Phosphatase: 110 units/L (ref 39–117)
Calcium, Total (PTH): 8.5 mg/dL (ref 8.4–10.5)
Calcium: 8.5 mg/dL (ref 8.4–10.5)
Chloride: 102 meq/L (ref 96–112)
Creatinine, Ser: 2.35 mg/dL — ABNORMAL HIGH (ref 0.40–1.20)
Platelets: 350 10*3/uL (ref 150–400)
Potassium: 4.8 meq/L (ref 3.5–5.3)
RDW: 13.3 % (ref 11.5–15.5)

## 2009-10-31 ENCOUNTER — Ambulatory Visit (HOSPITAL_COMMUNITY): Admission: RE | Admit: 2009-10-31 | Discharge: 2009-10-31 | Payer: Self-pay | Admitting: Family Medicine

## 2009-11-03 ENCOUNTER — Ambulatory Visit: Payer: Self-pay | Admitting: Family Medicine

## 2009-11-03 ENCOUNTER — Telehealth: Payer: Self-pay | Admitting: Family Medicine

## 2009-11-03 DIAGNOSIS — M109 Gout, unspecified: Secondary | ICD-10-CM

## 2009-11-19 ENCOUNTER — Telehealth: Payer: Self-pay | Admitting: Family Medicine

## 2009-12-02 ENCOUNTER — Telehealth: Payer: Self-pay | Admitting: Family Medicine

## 2009-12-10 ENCOUNTER — Encounter: Payer: Self-pay | Admitting: Family Medicine

## 2009-12-17 ENCOUNTER — Ambulatory Visit: Payer: Self-pay | Admitting: Family Medicine

## 2009-12-24 ENCOUNTER — Telehealth: Payer: Self-pay | Admitting: Family Medicine

## 2010-04-06 ENCOUNTER — Telehealth: Payer: Self-pay | Admitting: Family Medicine

## 2010-04-09 ENCOUNTER — Encounter: Payer: Self-pay | Admitting: Family Medicine

## 2010-04-17 ENCOUNTER — Telehealth: Payer: Self-pay | Admitting: Family Medicine

## 2010-04-29 ENCOUNTER — Ambulatory Visit: Admission: RE | Admit: 2010-04-29 | Discharge: 2010-04-29 | Payer: Self-pay | Source: Home / Self Care

## 2010-04-29 LAB — CONVERTED CEMR LAB: Hgb A1c MFr Bld: 8.5 %

## 2010-05-01 ENCOUNTER — Ambulatory Visit: Admission: RE | Admit: 2010-05-01 | Discharge: 2010-05-01 | Payer: Self-pay | Source: Home / Self Care

## 2010-05-01 ENCOUNTER — Encounter: Payer: Self-pay | Admitting: Family Medicine

## 2010-05-01 LAB — CONVERTED CEMR LAB
ALT: 15 units/L (ref 0–35)
AST: 11 units/L (ref 0–37)
Albumin: 4 g/dL (ref 3.5–5.2)
Calcium: 8.6 mg/dL (ref 8.4–10.5)
Chloride: 104 meq/L (ref 96–112)
Creatinine, Ser: 2.63 mg/dL — ABNORMAL HIGH (ref 0.40–1.20)
Potassium: 4.5 meq/L (ref 3.5–5.3)
Total CHOL/HDL Ratio: 2.6

## 2010-05-10 ENCOUNTER — Encounter: Payer: Self-pay | Admitting: Gastroenterology

## 2010-05-13 ENCOUNTER — Encounter (INDEPENDENT_AMBULATORY_CARE_PROVIDER_SITE_OTHER): Payer: Self-pay | Admitting: *Deleted

## 2010-05-19 NOTE — Progress Notes (Signed)
Summary: refill  Phone Note Refill Request Call back at Home Phone [phone] Message from:  Patient  Refills Requested: Medication #1:  LIPITOR 40 MG TABS by mouth at bedtime for cholesterol  Medication #2:  NORVASC 10 MG TABS 1 by mouth once daily for blood pressure Initial call taken by: Audie Clear,  Aug 28, 2009 2:47 PM    Prescriptions: NORVASC 10 MG TABS (AMLODIPINE BESYLATE) 1 by mouth once daily for blood pressure  #30 x 6   Entered and Authorized by:   Patria Mane  MD   Signed by:   Patria Mane  MD on 08/29/2009   Method used:   Electronically to        [facility] [phone]* (retail)       [place], [place]  [address]       Ph: [phone]       Fax: [phone]   RxIDEX:[id number] LIPITOR 40 MG TABS (ATORVASTATIN CALCIUM) by mouth at bedtime for cholesterol  #90 x 1   Entered and Authorized by:   Patria Mane  MD   Signed by:   Patria Mane  MD on 08/29/2009   Method used:   Electronically to        [facility] [phone]* (retail)       [address]       [place], [place]  [address]       Ph: [phone]       Fax: [phone]   RxIDYS:[id number]

## 2010-05-19 NOTE — Consult Note (Signed)
Summary: [facility]   Imported By: Audie Clear 12/19/2009 12:18:45  _____________________________________________________________________  External Attachment:    Type:   Image     Comment:   External Document  Appended Document: [facility] Reviewed

## 2010-05-19 NOTE — Progress Notes (Signed)
Summary: triage  Phone Note Call from Patient Call back at Home Phone [phone]   Caller: Patient Summary of Call: Pt has some fluid pills that she got from another doctor and since she does not have the money to get the ones Dr. Carlena Sax perscribed she was wondering if she could take the ones she has. Initial call taken by: Raymond Gurney,  Sep 04, 2009 12:02 PM  Follow-up for Phone Call        states she has lasix. wants to know if she can take that to save money. does not want to buy torosemide unless she has to. told her I will ask md & call her with response Follow-up by: Elige Radon RN,  Sep 04, 2009 12:17 PM  Additional Follow-up for Phone Call Additional follow up Details #1::        what dose of lasix was she given? likely we can do lasix but i need to know the dose.  Additional Follow-up by: Patria Mane  MD,  Sep 05, 2009 8:33 AM    Additional Follow-up for Phone Call Additional follow up Details #2::    LM Follow-up by: Elige Radon RN,  Sep 05, 2009 8:37 AM  Additional Follow-up for Phone Call Additional follow up Details #3:: Details for Additional Follow-up Action Taken: she will call us back with the dose Additional Follow-up by: Elige Radon RN,  Sep 05, 2009 2:26 PM  HER LASIX IS 80MG  PER PILL. TAKE ONE two times a day. to pcp to see if she can take this instead of buying the torosemide...Marland KitchenMarland KitchenElige Radon RN  Sep 05, 2009 2:35 PM  okay for now.  please follow up with me in 2 wks to check labs and reassess. Patria Mane  MD  Sep 05, 2009 4:05 PM  LM.Marland KitchenElige Radon RN  Sep 08, 2009 9:13 AM   gave her the above instructions & made appt 6/10 with pcp to see how she is doing with this med.Elige Radon RN  Sep 08, 2009 9:43 AM

## 2010-05-19 NOTE — Assessment & Plan Note (Signed)
Summary: f/u,df   Vital Signs:  Patient profile:   70 year old female Height:      63 inches Weight:      271.1 pounds BMI:     48.20 Temp:     97.5 degrees F oral Pulse rate:   85 / minute BP sitting:   150 / 78  (left arm) Cuff size:   large  Vitals Entered By: Levert Feinstein LPN (April 29, 624THL 624THL AM)  Primary Care Provider:  Patria Mane  MD  CC:  f/u dm, leg swelling, and HTN.  History of Present Illness: DM: reports taking her lantus 40 units at bedtime most days but recently has had low AM cbgs on most days with symptoms of hypoglycemia - namely sweating.  will eat something and it will bring sugar up.  for this reason sometimes she only takes 20 units in the evenings.  she has noticed some how her sugar changes depending on what she eats - she reports one episode when she ate fried fish that her sugars were higher.  otherwise doing okay.  reports she is checking her feet.     HTN: reports taking her meds as prescribed except for her torsemide which she reports taking as needed.  she doesn't feel it works as well as her furosemide did in the past but she again reports she hasn't been taking it.  hasn't been checking BP at home as suggested in the past.  leg swelling: still hasn't gotten the compression stockings as prescribed.  has noticed changes on her legs with the swelling that she has been "trying to scrub off"  without success.  has noticed that her legs are less swollen in the AM.    Current Medications (verified): 1)  Bayer Childrens Aspirin 81 Mg Chew (Aspirin) .... Take 1 Tablet By Mouth Once A Day 2)  Accupril 40 Mg Tabs (Quinapril Hcl) .Marland Kitchen.. 1 By Mouth Bid For Blood Pressure 3)  Lipitor 40 Mg Tabs (Atorvastatin Calcium) .... By Mouth At Bedtime For Cholesterol 4)  Lantus Solostar 100 Unit/ml  Soln (Insulin Glargine) .... As Directed  30 Units Each Evening.  Disp 90 Day Supply. 5)  Torsemide 10 Mg Tabs (Torsemide) .Marland Kitchen.. 1 By Mouth Once Daily.  Replaces Lasix  (Furosemide) 6)  Colace 100 Mg  Caps (Docusate Sodium) .Marland Kitchen.. 1 Tab By Mouth Bid 7)  Glipizide 10 Mg Tabs (Glipizide) .Marland Kitchen.. 1 By Mouth Two Times A Day 8)  Ferrous Sulfate 325 (65 Fe) Mg  Tbec (Ferrous Sulfate) .Marland Kitchen.. 1 Tab By Mouth Daily 9)  One Daily Womens  Tabs (Multiple Vitamins-Minerals) .Marland Kitchen.. 1 Daily 10)  Carvedilol 6.25 Mg Tabs (Carvedilol) .Marland Kitchen.. 1 By Mouth Two Times A Day 11)  Norvasc 10 Mg Tabs (Amlodipine Besylate) .Marland Kitchen.. 1 By Mouth Once Daily For Blood Pressure 12)  1st Choice Pen Needles 31g X 6 Mm Misc (Insulin Pen Needle) .... Disp Qs For Once Daily Insulin With Lantus Solostar Pen. 13)  Cetirizine Hcl 10 Mg Tabs (Cetirizine Hcl) .... Once Daily As Needed For Allergies  Allergies (verified): No Known Drug Allergies  Past History:  Past medical, surgical, family and social histories (including risk factors) reviewed for relevance to current acute and chronic problems.  Past Medical History: Reviewed history from 02/22/2007 and no changes required. Obesity HTN DM Carpal tunnel syndrome OA Hyperlipidemia Fe Def anemia 24 HR Urine protein 1500 mg/day baseline creat 1.9 followed [facility]  Past Surgical History: Reviewed history from 02/22/2007  and no changes required. ECHO 08/2004 EF 55-65% LV wall thickness - 02/11/2006 partial hysterectomy 1980 - 09/25/2004 SBO with ventral hernia repair, lysis of adhesions in 10/08 with subsequent dehiscince  Family History: Reviewed history from 07/24/2008 and no changes required. noncontributory  Social History: Reviewed history from 07/24/2008 and no changes required. lives at home. formerly in nursing home for some time no tobacco, no alcohol, no drugs  Review of Systems       per HPI.  still very focused on her abdominal wall hernia and the fact that no one will fix it right now.  denies abdominal pain however or problems with her bowels.   Physical Exam  General:  alert, well-developed,  obese, and well-hydrated.   vitals reviewed Lungs:  Normal respiratory effort, chest expands symmetrically. Lungs are clear to auscultation, no crackles or wheezes. Heart:  Normal rate and regular rhythm. S1 and S2 normal without gallop, murmur, click, rub or other extra sounds. Abdomen:  obese.  large left abdominal wall hernia.  soft when lying flat.  enlarges with valsalva maneuver.  nontender.  midline scar from previous surgeries is well healed.   Extremities:  bilateral tense edema.  good distal pulses and skin warmth.  pitting.  Skin:  changes consistent with chronic venous stasis  Diabetes Management Exam:    Foot Exam (with socks and/or shoes not present):       Sensory-Pinprick/Light touch:          Left medial foot (L-4): normal          Left dorsal foot (L-5): normal          Left lateral foot (S-1): normal          Right medial foot (L-4): normal          Right dorsal foot (L-5): normal          Right lateral foot (S-1): normal       Sensory-Monofilament:          Left foot: normal          Right foot: normal       Inspection:          Left foot: normal          Right foot: normal       Nails:          Left foot: thickened          Right foot: thickened    Eye Exam:       Eye Exam done elsewhere          Date: 07/18/2009          Results: unknown          Done by: [facility]   Impression & Recommendations:  Problem # 1:  DIABETES MELLITUS II, UNCOMPLICATED ([id number]) Assessment Improved  improved slightly.  given hypoglycemia will decrease her lantus in the AM.  monitor sugars.  she is to call in 6 wks to let me know how her sugars are doing since she reports she cannot afford many copayments so we can perhaps make adjustments over the phone.  she is also to watch what she eats.  she plans to see our diabetic specialist dr Valentina Lucks at next visit for adjustments.  i have asked her to bring her meter to this visit. Her updated medication list for this problem  includes:    Bayer Childrens Aspirin 81 Mg Chew (Aspirin) .Marland Kitchen... Take 1 tablet by mouth once  a day    Accupril 40 Mg Tabs (Quinapril hcl) .Marland Kitchen... 1 by mouth bid for blood pressure    Lantus Solostar 100 Unit/ml Soln (Insulin glargine) .Marland Kitchen... As directed  30 units each evening.  disp 90 day supply.    Glipizide 10 Mg Tabs (Glipizide) .Marland Kitchen... 1 by mouth two times a day  Orders: A1C-[facility] [id number]) [facility]- Est  Level 4 [id number])  Labs Reviewed: Creat: 1.93 (04/23/2009)   Microalbumin: 2+ (01/16/2007) Reviewed HgBA1c results: 8.8 (08/15/2009)  9.0 (04/23/2009)  Problem # 2:  HYPERTENSION, BENIGN SYSTEMIC (ICD-401.1) Assessment: Deteriorated  unsure why worsened today.  monitor at home.  no med changes just yet.  to call with report of home BP in 6 wks for adjustment if necessary Her updated medication list for this problem includes:    Accupril 40 Mg Tabs (Quinapril hcl) .Marland Kitchen... 1 by mouth bid for blood pressure    Torsemide 10 Mg Tabs (Torsemide) .Marland Kitchen... 1 by mouth once daily.  replaces lasix (furosemide)    Carvedilol 6.25 Mg Tabs (Carvedilol) .Marland Kitchen... 1 by mouth two times a day    Norvasc 10 Mg Tabs (Amlodipine besylate) .Marland Kitchen... 1 by mouth once daily for blood pressure  Orders: [facility]- Est  Level 4 ([address])  BP today: 150/78 Prior BP: 130/78 (04/23/2009)  Prior 10 Yr Risk Heart Disease: 13 % (07/14/2006)  Labs Reviewed: K+: 4.4 (04/23/2009) Creat: : 1.93 (04/23/2009)   Chol: 146 (04/23/2009)   HDL: 68 (04/23/2009)   LDL: 60 (04/23/2009)   TG: 92 (04/23/2009)  Problem # 3:  LEG EDEMA, BILATERAL (ICD-782.3) again reiterated to her that she needs to get compression stockings as prescribed and to take her torsemide daily as prescribed.  Her updated medication list for this problem includes:    Torsemide 10 Mg Tabs (Torsemide) .Marland Kitchen... 1 by mouth once daily.  replaces lasix (furosemide)  Orders: [facility]- Est  Level 4 [id number])  Complete Medication List: 1)  Bayer Childrens Aspirin 81 Mg Chew (Aspirin) .... Take 1  tablet by mouth once a day 2)  Accupril 40 Mg Tabs (Quinapril hcl) .Marland Kitchen.. 1 by mouth bid for blood pressure 3)  Lipitor 40 Mg Tabs (Atorvastatin calcium) .... By mouth at bedtime for cholesterol 4)  Lantus Solostar 100 Unit/ml Soln (Insulin glargine) .... As directed  30 units each evening.  disp 90 day supply. 5)  Torsemide 10 Mg Tabs (Torsemide) .Marland Kitchen.. 1 by mouth once daily.  replaces lasix (furosemide) 6)  Colace 100 Mg Caps (Docusate sodium) .Marland Kitchen.. 1 tab by mouth bid 7)  Glipizide 10 Mg Tabs (Glipizide) .Marland Kitchen.. 1 by mouth two times a day 8)  Ferrous Sulfate 325 (65 Fe) Mg Tbec (Ferrous sulfate) .Marland Kitchen.. 1 tab by mouth daily 9)  One Daily Womens Tabs (Multiple vitamins-minerals) .Marland Kitchen.. 1 daily 10)  Carvedilol 6.25 Mg Tabs (Carvedilol) .Marland Kitchen.. 1 by mouth two times a day 11)  Norvasc 10 Mg Tabs (Amlodipine besylate) .Marland Kitchen.. 1 by mouth once daily for blood pressure 12)  1st Choice Pen Needles 31g X 6 Mm Misc (Insulin pen needle) .... Disp qs for once daily insulin with lantus solostar pen. 13)  Cetirizine Hcl 10 Mg Tabs (Cetirizine hcl) .... Once daily as needed for allergies  Other Orders: [facility] [phone])  Patient Instructions: 1)  Please follow up in 3 months with Dr Valentina Lucks for f/o Diabetes for adjustments and blood pressure. 2)  Write down your blood pressures when you go to stores and call me in about 6 weeks with some of the  numbers.  Do the same with your sugars and call me to let me know those numbers as well. 3)  Decrease your lantus to 30 units each evening since you are having the low numbers in the morning 4)  Be sure to do your hemacult cards given today. 5)  Be sure to get your stockings for your legs.  Prescriptions: CETIRIZINE HCL 10 MG TABS (CETIRIZINE HCL) once daily as needed for allergies  #30 x 3   Entered and Authorized by:   Patria Mane  MD   Signed by:   Patria Mane  MD on 08/15/2009   Method used:   Electronically to        [facility] [phone]* (retail)        [place], [place]  [address]       Ph: [phone]       Fax: [phone]   RxID:   [id number] ACCUPRIL 40 MG TABS (QUINAPRIL HCL) 1 by mouth BID for blood pressure  #180 x 1   Entered and Authorized by:   Patria Mane  MD   Signed by:   Patria Mane  MD on 08/15/2009   Method used:   Electronically to        [facility] [phone]* (retail)       [place], [place]  [address]       Ph: [phone]       Fax: [phone]   RxIDGL:[id number] TORSEMIDE 10 MG TABS (TORSEMIDE) 1 by mouth once daily.  replaces lasix (furosemide)  #30 x 3   Entered and Authorized by:   Patria Mane  MD   Signed by:   Patria Mane  MD on 08/15/2009   Method used:   Electronically to        [facility] [phone]* (retail)       [place], [place]  [address]       Ph: [phone]       Fax: [phone]   RxID:   [id number] GLIPIZIDE 10 MG TABS (GLIPIZIDE) 1 by mouth two times a day  #180 x 1   Entered and Authorized by:   Patria Mane  MD   Signed by:   Patria Mane  MD on 08/15/2009   Method used:   Electronically to        [facility] [phone]* (retail)       [address]       [place], [place]  [address]       Ph: [phone]       Fax: [phone]   RxIDSV:[id number]   [phone]   Prevention & Chronic Care Immunizations   Influenza vaccine: Not documented   Influenza vaccine due: Refused  (07/24/2008)    Tetanus booster: 01/22/2005: Done.   Tetanus booster due: 01/23/2015    Pneumococcal vaccine: Not documented   Pneumococcal vaccine due: Refused  (07/24/2008)    H. zoster vaccine: Not documented  Colorectal Screening   Hemoccult: Done.  (04/25/2005)   Hemoccult due: 04/25/2006    Colonoscopy: Not documented  Other Screening   Pap smear: Not documented   Pap smear due: Not Indicated    Mammogram: Normal  (11/01/2008)   Mammogram due: 11/2009    DXA bone density scan: Not documented   Smoking  status: never  (12/26/2008)  Diabetes  Mellitus   HgbA1C: 8.8  (08/15/2009)   Hemoglobin A1C due: 10/23/2008    Eye exam: unknown  (07/18/2009)   Eye exam due: 07/2010    Foot exam: yes  (08/15/2009)   High risk foot: Not documented   Foot care education: Not documented   Foot exam due: 09/06/2008    Urine microalbumin/creatinine ratio: Not documented   Urine microalbumin/cr due: Not Indicated    Diabetes flowsheet reviewed?: Yes   Progress toward A1C goal: Improved  Lipids   Total Cholesterol: 146  (04/23/2009)   LDL: 60  (04/23/2009)   LDL Direct: Not documented   HDL: 68  (04/23/2009)   Triglycerides: 92  (04/23/2009)    SGOT (AST): 15  (04/23/2009)   SGPT (ALT): 23  (04/23/2009)   Alkaline phosphatase: 180  (04/23/2009)   Total bilirubin: 0.3  (04/23/2009)    Lipid flowsheet reviewed?: Yes   Progress toward LDL goal: At goal  Hypertension   Last Blood Pressure: 150 / 78  (08/15/2009)   Serum creatinine: 1.93  (04/23/2009)   Serum potassium 4.4  (04/23/2009)    Hypertension flowsheet reviewed?: Yes   Progress toward BP goal: Deteriorated  Self-Management Support :   Personal Goals (by the next clinic visit) :     Personal A1C goal: 8  (12/03/2008)     Personal blood pressure goal: 130/80  (12/03/2008)     Personal LDL goal: 100  (12/03/2008)    Diabetes self-management support: Written self-care plan, Education handout  (08/15/2009)   Diabetes care plan printed   Diabetes education handout printed    Diabetes self-management support not done because: Good outcomes  (04/23/2009)    Hypertension self-management support: BP self-monitoring log, Written self-care plan, Education handout  (08/15/2009)   Hypertension self-care plan printed.   Hypertension education handout printed    Hypertension self-management support not done because: Good outcomes  (04/23/2009)    Lipid self-management support: Written self-care plan  (08/15/2009)   Lipid self-care plan  printed.    Lipid self-management support not done because: Good outcomes  (04/23/2009) CC: f/u dm, leg swelling, HTN Is Patient Diabetic? Yes Pain Assessment Patient in pain? no        Laboratory Results   Blood Tests   Date/Time Received: August 15, 2009 11:36 AM  Date/Time Reported: August 15, 2009 12:00 PM   HGBA1C: 8.8%   (Normal Range: Non-Diabetic - 3-6%   Control Diabetic - 6-8%)  Comments: ...............test performed by......Marland KitchenBonnie A. Martinique, MLS (ASCP)cm

## 2010-05-19 NOTE — Progress Notes (Signed)
Summary: refill  Phone Note Refill Request Call back at Home Phone [phone] Message from:  Patient  Refills Requested: Medication #1:  ACCUPRIL 40 MG TABS 1 by mouth BID for blood pressure Initial call taken by: Audie Clear,  August 05, 2009 1:51 PM    Prescriptions: ACCUPRIL 40 MG TABS (QUINAPRIL HCL) 1 by mouth BID for blood pressure  #180 x 1   Entered and Authorized by:   Patria Mane  MD   Signed by:   Patria Mane  MD on 08/05/2009   Method used:   Electronically to        [facility] [phone]* (retail)       [address]       [place], [place]  [address]       Ph: [phone]       Fax: [phone]   RxID:   [id number]

## 2010-05-19 NOTE — Progress Notes (Signed)
Summary: Rx Req  Phone Note Refill Request Call back at Home Phone [phone] Message from:  Patient  Refills Requested: Medication #1:  CARVEDILOL 6.25 MG TABS 1 by mouth two times a day [facility] [address]  Initial call taken by: Raymond Gurney,  December 02, 2009 2:38 PM    Prescriptions: CARVEDILOL 6.25 MG TABS (CARVEDILOL) 1 by mouth two times a day  #60 x 3   Entered and Authorized by:   Mariana Arn  MD   Signed by:   Mariana Arn  MD on 12/03/2009   Method used:   Electronically to        [facility] [phone]* (retail)       [address]       [place], [place]  [address]       Ph: [phone]       Fax: [phone]   RxIDUT:[id number]

## 2010-05-19 NOTE — Letter (Signed)
Summary: Lipid Letter  [facility]  [address]   [place], [place] [address]   Phone: [phone]  Fax: [phone]    06/23/2009  [place], [place]  [address]  Dear Ms. Ronnald Ramp:  We have carefully reviewed your last lipid profile from 04/23/2009 and the results are noted below with a summary of recommendations for lipid management.    Cholesterol:       146     Goal: <200   HDL "good" Cholesterol:   68     Goal: >40   LDL "bad" Cholesterol:   60     Goal: <100   Triglycerides:       92     Goal: <150    Your lipid goals have been met.  Continue with your current TLC diet and regimen (see below)    TLC Diet (Therapeutic Lifestyle Change): Saturated Fats & Transfatty acids should be kept < 7% of total calories ***Reduce Saturated Fats Polyunstaurated Fat can be up to 10% of total calories Monounsaturated Fat Fat can be up to 20% of total calories Total Fat should be no greater than 25-35% of total calories Carbohydrates should be 50-60% of total calories Protein should be approximately 15% of total calories Fiber should be at least 20-30 grams a day ***Increased fiber may help lower LDL Total Cholesterol should be < 200mg /day Consider adding plant stanol/sterols to diet (example: Benacol spread) ***A higher intake of unsaturated fat may reduce Triglycerides and Increase HDL    Adjunctive Measures (may lower LIPIDS and reduce risk of Heart Attack) include: Aerobic Exercise (20-30 minutes 3-4 times a week) Limit Alcohol Consumption Weight Reduction Aspirin 75-81 mg a day by mouth (if not allergic or contraindicated) Dietary Fiber 20-30 grams a day by mouth     Current Medications: 1)    Bayer Childrens Aspirin 81 Mg Chew (Aspirin) .... Take 1 tablet by mouth once a day 2)    Accupril 40 Mg Tabs (Quinapril hcl) .Marland Kitchen.. 1 by mouth bid for blood pressure 3)    Simvastatin 80 Mg Tabs (Simvastatin) .Marland Kitchen.. 1 by mouth at bedtime for  cholesterol 4)    Lantus Solostar 100 Unit/ml  Soln (Insulin glargine) .... As directed  40 units each morning.  disp 90 day supply. 5)    Torsemide 10 Mg Tabs (Torsemide) .Marland Kitchen.. 1 by mouth once daily.  replaces lasix (furosemide) 6)    Colace 100 Mg  Caps (Docusate sodium) .Marland Kitchen.. 1 tab by mouth bid 7)    Glipizide 10 Mg Tabs (Glipizide) .Marland Kitchen.. 1 by mouth two times a day 8)    Ferrous Sulfate 325 (65 Fe) Mg  Tbec (Ferrous sulfate) .Marland Kitchen.. 1 tab by mouth daily 9)    One Daily Womens  Tabs (Multiple vitamins-minerals) .Marland Kitchen.. 1 daily 10)    Carvedilol 6.25 Mg Tabs (Carvedilol) .Marland Kitchen.. 1 by mouth two times a day 11)    Norvasc 10 Mg Tabs (Amlodipine besylate) .Marland Kitchen.. 1 by mouth once daily for blood pressure 12)    Clotrimazole 1 % Crea (Clotrimazole) .... Apply to rash under breast two times a day until healed.  disp 30g tube 13)    1st Choice Pen Needles 31g X 6 Mm Misc (Insulin pen needle) .... Disp qs for once daily insulin with lantus solostar pen.  If you have any questions, please call. We appreciate being able to work with you.   Sincerely,    [place]  MD  Appended Document: Lipid Letter mailed.

## 2010-05-19 NOTE — Assessment & Plan Note (Signed)
Summary: f/up,tcb   Vital Signs:  Patient profile:   70 year old female Weight:      271.9 pounds Temp:     97.5 degrees F oral Pulse rate:   72 / minute Pulse rhythm:   regular BP sitting:   130 / 78  (left arm) Cuff size:   large  Vitals Entered By: Audelia Hives CMA (April 23, 2009 9:27 AM) CC: f/u DM, legs, HTN   Primary Care Provider:  Patria Mane  MD  CC:  f/u DM, legs, and HTN.  History of Present Illness: DM: rarely checking sugars.  typically when she does check her numbers are in the 200s.  lowest she has see is 101, highest 300s.  denies any hypoglycemic episodes or feeling poorly.  reports taking her medicines and insulin as prescribed except if she doens't have an appetite she won't take her lantus.   legs: continue to swell but discomfort is gone.  least swelling in AM.  never got stockings as recommended at last visit.  script given today.  HTN: taking meds as prescribed.  needs refill on norvasc.  denies any dizziness, chest pains, SOB.   Current Medications (verified): 1)  Bayer Childrens Aspirin 81 Mg Chew (Aspirin) .... Take 1 Tablet By Mouth Once A Day 2)  Accupril 40 Mg Tabs (Quinapril Hcl) .Marland Kitchen.. 1 By Mouth Bid For Blood Pressure 3)  Simvastatin 80 Mg Tabs (Simvastatin) .Marland Kitchen.. 1 By Mouth At Bedtime For Cholesterol 4)  Lantus Solostar 100 Unit/ml  Soln (Insulin Glargine) .... As Directed  40 Units Each Morning.  Disp 90 Day Supply. 5)  Torsemide 10 Mg Tabs (Torsemide) .Marland Kitchen.. 1 By Mouth Once Daily.  Replaces Lasix (Furosemide) 6)  Colace 100 Mg  Caps (Docusate Sodium) .Marland Kitchen.. 1 Tab By Mouth Bid 7)  Glipizide 10 Mg Tabs (Glipizide) .Marland Kitchen.. 1 By Mouth Two Times A Day 8)  Ferrous Sulfate 325 (65 Fe) Mg  Tbec (Ferrous Sulfate) .Marland Kitchen.. 1 Tab By Mouth Daily 9)  One Daily Womens  Tabs (Multiple Vitamins-Minerals) .Marland Kitchen.. 1 Daily 10)  Carvedilol 6.25 Mg Tabs (Carvedilol) .Marland Kitchen.. 1 By Mouth Two Times A Day 11)  Norvasc 10 Mg Tabs (Amlodipine Besylate) .Marland Kitchen.. 1 By Mouth Once Daily For  Blood Pressure 12)  Clotrimazole 1 % Crea (Clotrimazole) .... Apply To Rash Under Breast Two Times A Day Until Healed.  Disp 30g Tube 13)  1st Choice Pen Needles 31g X 6 Mm Misc (Insulin Pen Needle) .... Disp Qs For Once Daily Insulin With Lantus Solostar Pen.  Allergies (verified): No Known Drug Allergies  Past History:  Past medical, surgical, family and social histories (including risk factors) reviewed for relevance to current acute and chronic problems.  Past Medical History: Reviewed history from 02/22/2007 and no changes required. Obesity HTN DM Carpal tunnel syndrome OA Hyperlipidemia Fe Def anemia 24 HR Urine protein 1500 mg/day baseline creat 1.9 followed [place]  Past Surgical History: Reviewed history from 02/22/2007 and no changes required. ECHO 08/2004 EF 55-65% LV wall thickness - 02/11/2006 partial hysterectomy 1980 - 09/25/2004 SBO with ventral hernia repair, lysis of adhesions in 10/08 with subsequent dehiscince  Family History: Reviewed history from 07/24/2008 and no changes required. noncontributory  Social History: Reviewed history from 07/24/2008 and no changes required. lives at home. 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  denies abdominal pain.  denies recent cold/URI symptoms  Physical Exam  General:  alert, well-developed, obese, and well-hydrated.   vitals reviewed Lungs:  Normal respiratory effort, chest expands symmetrically. Lungs are clear to auscultation, no crackles or wheezes. Heart:  Normal rate and regular rhythm. S1 and S2 normal without gallop, murmur, click, rub or other extra sounds. Extremities:  bilateral tense edema.  good distal pulses and skin warmth.  pitting.    Impression & Recommendations:  Problem # 1:  DIABETES MELLITUS II, UNCOMPLICATED ([id number]) Assessment Improved encouraged insulin use  regularly.  A1C despite being 9 is improved.  contiue to monitor.  given glucose log today.  Her updated medication list for this problem includes:    Bayer Childrens Aspirin 81 Mg Chew (Aspirin) .Marland Kitchen... Take 1 tablet by mouth once a day    Accupril 40 Mg Tabs (Quinapril hcl) .Marland Kitchen... 1 by mouth bid for blood pressure    Lantus Solostar 100 Unit/ml Soln (Insulin glargine) .Marland Kitchen... As directed  40 units each morning.  disp 90 day supply.    Glipizide 10 Mg Tabs (Glipizide) .Marland Kitchen... 1 by mouth two times a day  Orders: A1C-[facility] [id number]) [place]- Est  Level 4 [id number])  Problem # 2:  LEG EDEMA, BILATERAL (ICD-782.3) Assessment: Unchanged  continue torsemide.  given script for compression stockings.   Her updated medication list for this problem includes:    Torsemide 10 Mg Tabs (Torsemide) .Marland Kitchen... 1 by mouth once daily.  replaces lasix (furosemide)  Orders: [place]- Est  Level 4 [id number])  Problem # 3:  HYPERTENSION, BENIGN SYSTEMIC (ICD-401.1) Assessment: Unchanged no changes.  at goal.   Her updated medication list for this problem includes:    Accupril 40 Mg Tabs (Quinapril hcl) .Marland Kitchen... 1 by mouth bid for blood pressure    Torsemide 10 Mg Tabs (Torsemide) .Marland Kitchen... 1 by mouth once daily.  replaces lasix (furosemide)    Carvedilol 6.25 Mg Tabs (Carvedilol) .Marland Kitchen... 1 by mouth two times a day    Norvasc 10 Mg Tabs (Amlodipine besylate) .Marland Kitchen... 1 by mouth once daily for blood pressure  Orders: [place]- Est  Level 4 ([id number])  Problem # 4:  HYPERLIPIDEMIA (ICD-272.4) Assessment: Unchanged due for lipid check.    Her updated medication list for this problem includes:    Simvastatin 80 Mg Tabs (Simvastatin) .Marland Kitchen... 1 by mouth at bedtime for cholesterol  Orders: Comp Met-[facility] [id number]) Lipid-[facility] [id number]) [facility]- Est  Level 4 [id number])  Complete Medication List: 1)  Bayer Childrens Aspirin 81 Mg Chew (Aspirin) .... Take 1 tablet by mouth once a day 2)  Accupril 40 Mg Tabs (Quinapril hcl) .Marland Kitchen.. 1 by mouth bid for blood  pressure 3)  Simvastatin 80 Mg Tabs (Simvastatin) .Marland Kitchen.. 1 by mouth at bedtime for cholesterol 4)  Lantus Solostar 100 Unit/ml Soln (Insulin glargine) .... As directed  40 units each morning.  disp 90 day supply. 5)  Torsemide 10 Mg Tabs (Torsemide) .Marland Kitchen.. 1 by mouth once daily.  replaces lasix (furosemide) 6)  Colace 100 Mg Caps (Docusate sodium) .Marland Kitchen.. 1 tab by mouth bid 7)  Glipizide 10 Mg Tabs (Glipizide) .Marland Kitchen.. 1 by mouth two times a day 8)  Ferrous Sulfate 325 (65 Fe) Mg Tbec (Ferrous sulfate) .Marland Kitchen.. 1 tab by mouth daily 9)  One Daily Womens Tabs (Multiple vitamins-minerals) .Marland Kitchen.. 1 daily 10)  Carvedilol 6.25 Mg Tabs (Carvedilol) .Marland Kitchen.. 1 by mouth two times a day 11)  Norvasc 10 Mg Tabs (Amlodipine besylate) .Marland Kitchen.. 1 by mouth once daily for blood pressure 12)  Clotrimazole  1 % Crea (Clotrimazole) .... Apply to rash under breast two times a day until healed.  disp 30g tube 13)  1st Choice Pen Needles 31g X 6 Mm Misc (Insulin pen needle) .... Disp qs for once daily insulin with lantus solostar pen.  Other Orders: CBC-[facility] [id number])  Patient Instructions: 1)  Please follow up in 3 months or of course sooner if needed. 2)  check your sugars once daily. 3)  I will let you know about your lab results. Prescriptions: NORVASC 10 MG TABS (AMLODIPINE BESYLATE) 1 by mouth once daily for blood pressure  #30 x 6   Entered and Authorized by:   Patria Mane  MD   Signed by:   Patria Mane  MD on 04/23/2009   Method used:   Print then Give to Patient   RxID:   [id number] 1ST CHOICE PEN NEEDLES 31G X 6 MM MISC (INSULIN PEN NEEDLE) disp QS for once daily insulin with lantus solostar pen.  #31 x 6   Entered and Authorized by:   Patria Mane  MD   Signed by:   Patria Mane  MD on 04/23/2009   Method used:   Print then Give to Patient   RxID:   [id number] CLOTRIMAZOLE 1 % CREA (CLOTRIMAZOLE) apply to rash under breast two times a day until healed.  Disp 30g tube  #30 x 1   Entered and Authorized by:    Patria Mane  MD   Signed by:   Patria Mane  MD on 04/23/2009   Method used:   Print then Give to Patient   RxID:   [id number]   Laboratory Results   Blood Tests   Date/Time Received: April 23, 2009 9:57 AM  Date/Time Reported: April 23, 2009 10:31 AM   HGBA1C: 9.0%   (Normal Range: Non-Diabetic - 3-6%   Control Diabetic - 6-8%)  Comments: ...........test performed by...........Marland KitchenHedy Camara, CMA     Prevention & Chronic Care Immunizations   Influenza vaccine: Not documented   Influenza vaccine due: Refused  (07/24/2008)    Tetanus booster: 01/22/2005: Done.   Tetanus booster due: 01/23/2015    Pneumococcal vaccine: Not documented   Pneumococcal vaccine due: Refused  (07/24/2008)    H. zoster vaccine: Not documented  Colorectal Screening   Hemoccult: Done.  (04/25/2005)   Hemoccult due: 04/25/2006    Colonoscopy: Not documented  Other Screening   Pap smear: Not documented   Pap smear due: Not Indicated    Mammogram: Normal  (11/01/2008)   Mammogram due: 11/2009    DXA bone density scan: Not documented   Smoking status: never  (12/26/2008)  Diabetes Mellitus   HgbA1C: 9.0  (04/23/2009)   Hemoglobin A1C due: 10/23/2008    Eye exam: Not documented    Foot exam: yes  (09/07/2007)   High risk foot: Not documented   Foot care education: Not documented   Foot exam due: 09/06/2008    Urine microalbumin/creatinine ratio: Not documented   Urine microalbumin/cr due: Not Indicated    Diabetes flowsheet reviewed?: Yes   Progress toward A1C goal: Improved  Lipids   Total Cholesterol: 149  (04/22/2008)   LDL: 53  (04/22/2008)   LDL Direct: Not documented   HDL: 58  (04/22/2008)   Triglycerides: 87  (06/16/2006)    SGOT (AST): 8  (12/03/2008)   SGPT (ALT): 11  (12/03/2008) CMP ordered    Alkaline phosphatase: 141  (12/03/2008)   Total bilirubin: 0.3  (12/03/2008)    Lipid  flowsheet reviewed?: Yes   Progress toward LDL goal: At  goal  Hypertension   Last Blood Pressure: 130 / 78  (04/23/2009)   Serum creatinine: 1.76  (12/26/2008)   Serum potassium 4.6  (12/26/2008) CMP ordered     Hypertension flowsheet reviewed?: Yes   Progress toward BP goal: At goal  Self-Management Support :   Personal Goals (by the next clinic visit) :     Personal A1C goal: 8  (12/03/2008)     Personal blood pressure goal: 130/80  (12/03/2008)     Personal LDL goal: 100  (12/03/2008)    Diabetes self-management support: Copy of home glucose meter record, Referred for DM self-management training  (12/03/2008)    Diabetes self-management support not done because: Good outcomes  (04/23/2009)    Hypertension self-management support: BP self-monitoring log  (12/03/2008)    Hypertension self-management support not done because: Good outcomes  (04/23/2009)    Lipid self-management support: Not documented     Lipid self-management support not done because: Good outcomes  (04/23/2009)

## 2010-05-19 NOTE — Assessment & Plan Note (Signed)
Summary: check labs//   Vital Signs:  Patient profile:   70 year old female Weight:      265.7 pounds Temp:     98.4 degrees F oral Pulse rate:   78 / minute BP sitting:   149 / 79  (left arm) Cuff size:   large  Vitals Entered By: Audelia Hives CMA (September 26, 2009 11:29 AM) CC: f/u peripheral edema, kidney fxn Is Patient Diabetic? Yes Did you bring your meter with you today? No   Primary Care Provider:  Patria Mane  MD  CC:  f/u peripheral edema and kidney fxn.  History of Present Illness: edema: couldn't afford torsemide so wanted to swtich back to furosemide.  has switched back to furosemide 80 two times a day.  states it is helping her edema more and is making her urinate.  she denis dizziness. she also denies worsening of foot pain with change. she reports she has been wearing stockings regularly (except for today) as advised at previous appts.   kidney fxn: needs labs checked with change of medicines and is due for PTH and hemaglobin as well.  we will send these results to her nephrologist  Habits & Providers  Alcohol-Tobacco-Diet     Tobacco Status: never  Current Medications (verified): 1)  Bayer Childrens Aspirin 81 Mg Chew (Aspirin) .... Take 1 Tablet By Mouth Once A Day 2)  Accupril 40 Mg Tabs (Quinapril Hcl) .Marland Kitchen.. 1 By Mouth Bid For Blood Pressure 3)  Lipitor 40 Mg Tabs (Atorvastatin Calcium) .... By Mouth At Bedtime For Cholesterol 4)  Lantus Solostar 100 Unit/ml  Soln (Insulin Glargine) .... As Directed  30 Units Each Evening.  Disp 90 Day Supply. 5)  Furosemide 80 Mg Tabs (Furosemide) .Marland Kitchen.. 1 By Mouth Two Times A Day For Fluid/blood Pressure 6)  Colace 100 Mg  Caps (Docusate Sodium) .Marland Kitchen.. 1 Tab By Mouth Bid 7)  Glipizide 10 Mg Tabs (Glipizide) .Marland Kitchen.. 1 By Mouth Two Times A Day 8)  Ferrous Sulfate 325 (65 Fe) Mg  Tbec (Ferrous Sulfate) .Marland Kitchen.. 1 Tab By Mouth Daily 9)  One Daily Womens  Tabs (Multiple Vitamins-Minerals) .Marland Kitchen.. 1 Daily 10)  Carvedilol 6.25 Mg Tabs  (Carvedilol) .Marland Kitchen.. 1 By Mouth Two Times A Day 11)  Norvasc 10 Mg Tabs (Amlodipine Besylate) .Marland Kitchen.. 1 By Mouth Once Daily For Blood Pressure 12)  1st Choice Pen Needles 31g X 6 Mm Misc (Insulin Pen Needle) .... Disp Qs For Once Daily Insulin With Lantus Solostar Pen. 13)  Cetirizine Hcl 10 Mg Tabs (Cetirizine Hcl) .... Once Daily As Needed For Allergies  Allergies (verified): No Known Drug Allergies  Past History:  Past medical, surgical, family and social histories (including risk factors) reviewed for relevance to current acute and chronic problems.  Past Medical History: Obesity HTN DM Carpal tunnel syndrome OA Hyperlipidemia Fe Def anemia 24 HR Urine protein 1500 mg/day baseline creat 1.9 followed [place] ? 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  currently non operable per [facility] due to weight, comorbidities SMALL BOWEL OBSTRUCTION, HX OF (ICD-V12.79) GLAUCOMA (ICD-365.9)  Past Surgical History: Reviewed history from 02/22/2007 and no changes required. ECHO 08/2004 EF 55-65% LV wall thickness - 02/11/2006 partial hysterectomy 1980 - 09/25/2004 SBO with ventral hernia repair, lysis of adhesions in 10/08 with subsequent dehiscince  Family History: Reviewed history from 07/24/2008 and no changes required. noncontributory  Social History: Reviewed history from 07/24/2008 and no changes required. lives at home. formerly in [place] for some time no tobacco, no alcohol, no drugs  Review of Systems       per HPI  Physical Exam  General:  alert, well-developed, obese, and well-hydrated.   vitals reviewed Lungs:  Normal respiratory effort, chest expands symmetrically. Lungs are clear to auscultation, no crackles or wheezes. Heart:  Normal rate and regular rhythm. S1 and S2 normal without  gallop, murmur, click, rub or other extra sounds. Extremities:  bilateral 2+ edema.  good distal pulses and skin warmth.  pitting. skin changes from edema noted.    Impression & Recommendations:  Problem # 1:  LEG EDEMA, BILATERAL (ICD-782.3) Assessment Unchanged  okay with change to furosemide with [place] checking labs, monitoring for gout flare.   Her updated medication list for this problem includes:    Furosemide 80 Mg Tabs (Furosemide) .Marland Kitchen... 1 by mouth two times a day for fluid/blood pressure  Orders: [facility]- Est Level  3 [id number])  Problem # 2:  RENAL INSUFFICIENCY, CHRONIC (ICD-586) Assessment: Unchanged check labs as noted above.   Orders: Comp Met-[facility] [phone]) Magnesium-[facility] [phone]) Phosphorus-[facility] [phone]) CBC-[facility] [id number]) Miscellaneous Lab Charge-[facility] [phone]) [place]- Est Level  3 [id number])  Complete Medication List: 1)  Bayer Childrens Aspirin 81 Mg Chew (Aspirin) .... Take 1 tablet by mouth once a day 2)  Accupril 40 Mg Tabs (Quinapril hcl) .Marland Kitchen.. 1 by mouth bid for blood pressure 3)  Lipitor 40 Mg Tabs (Atorvastatin calcium) .... By mouth at bedtime for cholesterol 4)  Lantus Solostar 100 Unit/ml Soln (Insulin glargine) .... As directed  30 units each evening.  disp 90 day supply. 5)  Furosemide 80 Mg Tabs (Furosemide) .Marland Kitchen.. 1 by mouth two times a day for fluid/blood pressure 6)  Colace 100 Mg Caps (Docusate sodium) .Marland Kitchen.. 1 tab by mouth bid 7)  Glipizide 10 Mg Tabs (Glipizide) .Marland Kitchen.. 1 by mouth two times a day 8)  Ferrous Sulfate 325 (65 Fe) Mg Tbec (Ferrous sulfate) .Marland Kitchen.. 1 tab by mouth daily 9)  One Daily Womens Tabs (Multiple vitamins-minerals) .Marland Kitchen.. 1 daily 10)  Carvedilol 6.25 Mg Tabs (Carvedilol) .Marland Kitchen.. 1 by mouth two times a day 11)  Norvasc 10 Mg Tabs (Amlodipine besylate) .Marland Kitchen.. 1 by mouth once daily for blood pressure 12)  1st Choice Pen Needles 31g X 6 Mm Misc (Insulin pen needle) .... Disp qs for once daily insulin with lantus solostar pen. 13)  Cetirizine Hcl 10  Mg Tabs (Cetirizine hcl) .... Once daily as needed for allergies  Patient Instructions: 1)  Be sure to take your medicines every day.  2)  Wear the stocking as much as possible on your legs.  3)  Please follow up at end of July for your next appointment for your diabetes.  4)  If you start getting back foot pains please let me know.  Prescriptions: FUROSEMIDE 80 MG TABS (FUROSEMIDE) 1 by mouth two times a day for fluid/blood pressure  #60 x 3   Entered and Authorized by:   Patria Mane  MD   Signed by:   Patria Mane  MD on 09/26/2009   Method used:   Electronically to        [facility] [phone]* (retail)       [address]       [place], [place]  [address]       Ph: [phone]       Fax: [phone]  [id number]

## 2010-05-19 NOTE — Consult Note (Signed)
Summary: [facility]   Imported By: Raymond Gurney 10/01/2009 12:07:28  _____________________________________________________________________  External Attachment:    Type:   Image     Comment:   External Document

## 2010-05-19 NOTE — Progress Notes (Signed)
Summary: refill  Phone Note Refill Request Call back at Home Phone [phone] Call back at [phone] Message from:  Patient  Refills Requested: Medication #1:  LANTUS SOLOSTAR 100 UNIT/ML  SOLN as directed  30 units each evening.  Disp 90 day supply. pt is out  Initial call taken by: Audie Clear,  November 19, 2009 10:52 AM    Prescriptions: LANTUS SOLOSTAR 100 UNIT/ML  SOLN (INSULIN GLARGINE) as directed  30 units each evening.  Disp 90 day supply.  #1 x 1   Entered and Authorized by:   Mariana Arn  MD   Signed by:   Mariana Arn  MD on 11/19/2009   Method used:   Electronically to        [facility] [phone]* (retail)       [address]       [place], [place]  [address]       Ph: [phone]       Fax: [phone]   RxID:   [id number]

## 2010-05-19 NOTE — Assessment & Plan Note (Signed)
Summary: f/up,tcb   Vital Signs:  Patient profile:   70 year old female Height:      63 inches Weight:      265 pounds BMI:     47.11 Temp:     98.7 degrees F oral Pulse rate:   69 / minute BP sitting:   131 / 71  (left arm) Cuff size:   large  Vitals Entered By: Levert Feinstein LPN (August 31, 624THL 2:35 PM) CC: f/u dm Is Patient Diabetic? Yes Did you bring your meter with you today? No Pain Assessment Patient in pain? no        Primary Care Provider:  Mariana Arn  MD  CC:  f/u dm.  History of Present Illness: 1) DM2: Last A1C 8.8 in April 2011. A1C 10.2 today. Reports that she has eliminated fried foods, sweets, but review of meal history reveals otherwise. Takes 40 units of Lantus each evening, but reports that she does not take it on days that she does not eat enough. Does not check blood sugars - ran out of strips and did not call for refill. Walks 15 minues 1 x a week. Also on Glipizide as below. Checks feet every day. Last ophtho visit was this year as below. Understands symptoms of hypoglycemia.   2) HTN: Taking all antihypertensives without side effects. Does not check blood pressure or monitor salt intake   ROS: Denies chest pain, dyspnea, polyuria, polydipsia, vision change, LE edema or loss of sensation.   Habits & Providers  Alcohol-Tobacco-Diet     Tobacco Status: never  Current Medications (verified): 1)  Bayer Childrens Aspirin 81 Mg Chew (Aspirin) .... Take 1 Tablet By Mouth Once A Day 2)  Accupril 40 Mg Tabs (Quinapril Hcl) .Marland Kitchen.. 1 By Mouth Bid For Blood Pressure 3)  Lipitor 40 Mg Tabs (Atorvastatin Calcium) .... By Mouth At Bedtime For Cholesterol 4)  Lantus Solostar 100 Unit/ml  Soln (Insulin Glargine) .... As Directed  40 Units Each Evening.  Disp 90 Day Supply. 5)  Furosemide 80 Mg Tabs (Furosemide) .Marland Kitchen.. 1 By Mouth Two Times A Day For Fluid/blood Pressure 6)  Colace 100 Mg  Caps (Docusate Sodium) .Marland Kitchen.. 1 Tab By Mouth Bid 7)  Glipizide 10 Mg Tabs  (Glipizide) .Marland Kitchen.. 1 By Mouth Two Times A Day 8)  Ferrous Sulfate 325 (65 Fe) Mg  Tbec (Ferrous Sulfate) .Marland Kitchen.. 1 Tab By Mouth Daily 9)  One Daily Womens  Tabs (Multiple Vitamins-Minerals) .Marland Kitchen.. 1 Daily 10)  Carvedilol 6.25 Mg Tabs (Carvedilol) .Marland Kitchen.. 1 By Mouth Two Times A Day 11)  Norvasc 10 Mg Tabs (Amlodipine Besylate) .Marland Kitchen.. 1 By Mouth Once Daily For Blood Pressure 12)  1st Choice Pen Needles 31g X 6 Mm Misc (Insulin Pen Needle) .... Disp Qs For Once Daily Insulin With Lantus Solostar Pen. 13)  Cetirizine Hcl 10 Mg Tabs (Cetirizine Hcl) .... Once Daily As Needed For Allergies 14)  Tramadol Hcl 50 Mg Tabs (Tramadol Hcl) .Marland Kitchen.. 1-2 Every 6 Hours As Needed Pain  Allergies (verified): No Known Drug Allergies  Physical Exam  General:  obese, NAD, pleasant  Eyes:  pupils equal, round and reactive to light , extraoccular movements intact, unable to visualize fundi   Mouth:  moist membranes . Neck:  no JVD  Lungs:  Normal respiratory effort, chest expands symmetrically. Lungs are clear to auscultation, no crackles or wheezes. Heart:  Normal rate and regular rhythm. S1 and S2 normal without gallop, murmur, click, rub or other extra sounds. Abdomen:  obese.  large left abdominal wall hernia.  soft when lying flat.  enlarges with valsalva maneuver.  nontender.  midline scar from previous surgeries is well healed.   Neurologic:  alert & oriented X3.    Diabetes Management Exam:    Foot Exam (with socks and/or shoes not present):       Sensory-Pinprick/Light touch:          Left medial foot (L-4): normal          Left dorsal foot (L-5): normal          Left lateral foot (S-1): normal          Right medial foot (L-4): normal          Right dorsal foot (L-5): normal          Right lateral foot (S-1): normal       Sensory-Monofilament:          Left foot: normal          Right foot: normal       Inspection:          Left foot: normal          Right foot: normal       Nails:          Left foot:  normal          Right foot: normal   Impression & Recommendations:  Problem # 1:  HYPERTENSION, BENIGN SYSTEMIC (ICD-401.1)  Near goal. Continue medications. Reviewed DASH diet, exercise. Follow up in three months.  Her updated medication list for this problem includes:    Accupril 40 Mg Tabs (Quinapril hcl) .Marland Kitchen... 1 by mouth bid for blood pressure    Furosemide 80 Mg Tabs (Furosemide) .Marland Kitchen... 1 by mouth two times a day for fluid/blood pressure    Carvedilol 6.25 Mg Tabs (Carvedilol) .Marland Kitchen... 1 by mouth two times a day    Norvasc 10 Mg Tabs (Amlodipine besylate) .Marland Kitchen... 1 by mouth once daily for blood pressure  BP today: 131/71 Prior BP: 148/78 (11/03/2009)  Prior 10 Yr Risk Heart Disease: 13 % (07/14/2006)  Labs Reviewed: K+: 4.8 (09/26/2009) Creat: : 2.35 (09/26/2009)   Chol: 146 (04/23/2009)   HDL: 68 (04/23/2009)   LDL: 60 (04/23/2009)   TG: 92 (04/23/2009)  Orders: [place]- Est  Level 4 [id number])  Problem # 2:  DIABETES MELLITUS II, UNCOMPLICATED ([id number]) Assessment: Deteriorated Deteriorated. Secondary to poor adherence, meal portion sizes and choices. Reviewed portion control, DASH diet for HTN, dosing of insulin, hypolglycemic symptoms.  Follow up three months.   Her updated medication list for this problem includes:    Bayer Childrens Aspirin 81 Mg Chew (Aspirin) .Marland Kitchen... Take 1 tablet by mouth once a day    Accupril 40 Mg Tabs (Quinapril hcl) .Marland Kitchen... 1 by mouth bid for blood pressure    Lantus Solostar 100 Unit/ml Soln (Insulin glargine) .Marland Kitchen... As directed  40 units each evening.  disp 90 day supply.    Glipizide 10 Mg Tabs (Glipizide) .Marland Kitchen... 1 by mouth two times a day  Orders: [facility] [id number]) [facility]- Est  Level 4 [id number])  Labs Reviewed: Creat: 2.35 (09/26/2009)   Microalbumin: 2+ (01/16/2007)  Last Eye Exam: unknown (07/18/2009) Reviewed HgBA1c results: 10.2 (12/17/2009)  8.8 (08/15/2009)  Complete Medication List: 1)  Bayer Childrens Aspirin 81 Mg Chew (Aspirin) .... Take 1  tablet by mouth once a day 2)  Accupril 40 Mg Tabs (Quinapril hcl) .Marland Kitchen.. 1 by mouth bid for blood pressure  3)  Lipitor 40 Mg Tabs (Atorvastatin calcium) .... By mouth at bedtime for cholesterol 4)  Lantus Solostar 100 Unit/ml Soln (Insulin glargine) .... As directed  40 units each evening.  disp 90 day supply. 5)  Furosemide 80 Mg Tabs (Furosemide) .Marland Kitchen.. 1 by mouth two times a day for fluid/blood pressure 6)  Colace 100 Mg Caps (Docusate sodium) .Marland Kitchen.. 1 tab by mouth bid 7)  Glipizide 10 Mg Tabs (Glipizide) .Marland Kitchen.. 1 by mouth two times a day 8)  Ferrous Sulfate 325 (65 Fe) Mg Tbec (Ferrous sulfate) .Marland Kitchen.. 1 tab by mouth daily 9)  One Daily Womens Tabs (Multiple vitamins-minerals) .Marland Kitchen.. 1 daily 10)  Carvedilol 6.25 Mg Tabs (Carvedilol) .Marland Kitchen.. 1 by mouth two times a day 11)  Norvasc 10 Mg Tabs (Amlodipine besylate) .Marland Kitchen.. 1 by mouth once daily for blood pressure 12)  1st Choice Pen Needles 31g X 6 Mm Misc (Insulin pen needle) .... Disp qs for once daily insulin with lantus solostar pen. 13)  Cetirizine Hcl 10 Mg Tabs (Cetirizine hcl) .... Once daily as needed for allergies 14)  Tramadol Hcl 50 Mg Tabs (Tramadol hcl) .Marland Kitchen.. 1-2 every 6 hours as needed pain  Patient Instructions: 1)  Follow up in three months  2)  Take your Lantus as prescribed 40 units each evening 3)  Call me to let me know what meter you are using so I can refill the strips for you.  4)  Check you blood sugars every morning (before meal) and record. 5)  Walk 30-45 minutes per day 5 times a week.  6)  Follow up in three months. 7)  Use the portion control guide to figure out how muc hto eat.   Laboratory Results   Blood Tests   Date/Time Received: December 17, 2009 2:31 PM  Date/Time Reported: December 17, 2009 2:43 PM   HGBA1C: 10.2%   (Normal Range: Non-Diabetic - 3-6%   Control Diabetic - 6-8%)  Comments: ...............test performed by......Marland KitchenBonnie A. Martinique, MLS (ASCP)cm       Prevention & Chronic Care Immunizations    Influenza vaccine: Not documented   Influenza vaccine due: Refused  (07/24/2008)    Tetanus booster: 01/22/2005: Done.   Tetanus booster due: 01/23/2015    Pneumococcal vaccine: Not documented   Pneumococcal vaccine due: Refused  (07/24/2008)    H. zoster vaccine: Not documented  Colorectal Screening   Hemoccult: Done.  (04/25/2005)   Hemoccult due: 04/25/2006    Colonoscopy: Not documented  Other Screening   Pap smear: Not documented   Pap smear due: Not Indicated    Mammogram: Normal  (11/10/2009)   Mammogram due: 11/2010    DXA bone density scan: Not documented   Smoking status: never  (12/17/2009)  Diabetes Mellitus   HgbA1C: 10.2  (12/17/2009)   Hemoglobin A1C due: 10/23/2008    Eye exam: unknown  (07/18/2009)   Eye exam due: 07/2010    Foot exam: yes  (12/17/2009)   High risk foot: Not documented   Foot care education: Not documented   Foot exam due: 09/06/2008    Urine microalbumin/creatinine ratio: Not documented   Urine microalbumin/cr due: Not Indicated    Diabetes flowsheet reviewed?: Yes   Progress toward A1C goal: Deteriorated  Lipids   Total Cholesterol: 146  (04/23/2009)   LDL: 60  (04/23/2009)   LDL Direct: Not documented   HDL: 68  (04/23/2009)   Triglycerides: 92  (04/23/2009)    SGOT (AST): 13  (09/26/2009)   SGPT (ALT): 15  (  09/26/2009)   Alkaline phosphatase: 110  (09/26/2009)   Total bilirubin: 0.3  (09/26/2009)    Lipid flowsheet reviewed?: Yes   Progress toward LDL goal: At goal  Hypertension   Last Blood Pressure: 131 / 71  (12/17/2009)   Serum creatinine: 2.35  (09/26/2009)   Serum potassium 4.8  (09/26/2009)    Hypertension flowsheet reviewed?: Yes   Progress toward BP goal: Improved  Self-Management Support :   Personal Goals (by the next clinic visit) :     Personal A1C goal: 8  (12/03/2008)     Personal blood pressure goal: 130/80  (12/03/2008)     Personal LDL goal: 100  (12/03/2008)    Patient will work on the  following items until the next clinic visit to reach self-care goals:     Medications and monitoring: take my medicines every day, check my blood sugar, check my blood pressure, bring all of my medications to every visit, weigh myself weekly, examine my feet every day  (12/17/2009)     Eating: drink diet soda or water instead of juice or soda, eat more vegetables, use fresh or frozen vegetables, eat foods that are low in salt, eat baked foods instead of fried foods  (12/17/2009)     Activity: take a 30 minute walk every day  (12/17/2009)    Diabetes self-management support: CBG self-monitoring log, Written self-care plan, Pre-printed educational material  (12/17/2009)   Diabetes care plan printed    Diabetes self-management support not done because: Good outcomes  (04/23/2009)    Hypertension self-management support: BP self-monitoring log, Written self-care plan, Education handout  (08/15/2009)    Hypertension self-management support not done because: Good outcomes  (12/17/2009)    Lipid self-management support: Written self-care plan  (08/15/2009)     Lipid self-management support not done because: Good outcomes  (12/17/2009)

## 2010-05-19 NOTE — Assessment & Plan Note (Signed)
Summary: gout per pt/[place]/carew   Vital Signs:  Patient profile:   70 year old female Height:      63 inches Weight:      271 pounds BMI:     48.18 BSA:     2.20 Temp:     97.9 degrees F Pulse rate:   78 / minute BP sitting:   148 / 1  Vitals Entered By: Christen Bame CMA (November 03, 2009 3:41 PM) CC: gout left foot Is Patient Diabetic? Yes Did you bring your meter with you today? No Pain Assessment [name]     Location: left foot Intensity: 4   Primary Care Provider:  Patria Mane  MD  CC:  gout left foot.  History of Present Illness: Pain in Left Foot started 4 days ago with pain and soft tissue swelling in base of L great toe.  No trauma or fever or red streaks or other painful joints.  Similiar to gout attack last fall.  Not taking any medicines for it.  Has appointment with her nephrogist in August.  ROS - as above PMH - Medications reviewed and updated in medication list.  Smoking Status noted in VS form    Habits & Providers  Alcohol-Tobacco-Diet     Tobacco Status: never  Allergies: No Known Drug Allergies  Physical Exam  General:  Well-developed,well-nourished,in no acute distress; alert,appropriate and cooperative throughout examination Extremities:  Left foot - soft tissue swelling and redness at base of great toe.  Moderately painful but with good range of motion.  No other joints involved.  Chronic edema in both lower extremities    Impression & Recommendations:  Problem # 1:  GOUT, ACUTE (ICD-274.01)  consistent with gout attack.  Delayed presenting due to transportation issues.  Last UA was > 10.  Will use Hobbs low dose since is already improving nd patient has diabetes and renal failure.  Suggest discuss with her nephrologist starting allopurinol for prevention  Orders: [facility]- Est Level  3 [id number])  Complete Medication List: 1)  Bayer Childrens Aspirin 81 Mg Chew (Aspirin) .... Take 1 tablet by mouth once a day 2)   Accupril 40 Mg Tabs (Quinapril hcl) .Marland Kitchen.. 1 by mouth bid for blood pressure 3)  Lipitor 40 Mg Tabs (Atorvastatin calcium) .... By mouth at bedtime for cholesterol 4)  Lantus Solostar 100 Unit/ml Soln (Insulin glargine) .... As directed  30 units each evening.  disp 90 day supply. 5)  Furosemide 80 Mg Tabs (Furosemide) .Marland Kitchen.. 1 by mouth two times a day for fluid/blood pressure 6)  Colace 100 Mg Caps (Docusate sodium) .Marland Kitchen.. 1 tab by mouth bid 7)  Glipizide 10 Mg Tabs (Glipizide) .Marland Kitchen.. 1 by mouth two times a day 8)  Ferrous Sulfate 325 (65 Fe) Mg Tbec (Ferrous sulfate) .Marland Kitchen.. 1 tab by mouth daily 9)  One Daily Womens Tabs (Multiple vitamins-minerals) .Marland Kitchen.. 1 daily 10)  Carvedilol 6.25 Mg Tabs (Carvedilol) .Marland Kitchen.. 1 by mouth two times a day 11)  Norvasc 10 Mg Tabs (Amlodipine besylate) .Marland Kitchen.. 1 by mouth once daily for blood pressure 12)  1st Choice Pen Needles 31g X 6 Mm Misc (Insulin pen needle) .... Disp qs for once daily insulin with lantus solostar pen. 13)  Cetirizine Hcl 10 Mg Tabs (Cetirizine hcl) .... Once daily as needed for allergies 14)  Hobbs 20 Mg Tabs (Hobbs) .Marland Kitchen.. 1 by mouth daily for next 4 days 15)  Tramadol Hcl 50 Mg Tabs (Tramadol hcl) .Marland Kitchen.. 1-2 every 6 hours  as needed pain  Patient Instructions: 1)  See Dr Sherilyn Cooter in 2 months 2)  Rachel Hobbs to help with the swelling and take the pain medication as need 3)  Tell your kidney Dr that you have had 2 episodes of gout and that we thought you should be on Allopurinol to prevent the attacks 4)  If you get fever or other joints are swollen or if you are not better in 4 days the call us Prescriptions: TRAMADOL HCL 50 MG TABS (TRAMADOL HCL) 1-2 every 6 hours as needed pain  #20 x 1   Entered and Authorized by:   Talbert Cage MD   Signed by:   Talbert Cage MD on 11/03/2009   Method used:   Electronically to        [facility] [phone]* (retail)       [place], [place]  [address]       Ph:  [phone]       Fax: [phone]   RxID:   [id number] Hobbs 20 MG TABS (Hobbs) 1 by mouth daily for next 4 days  #4 x 0   Entered and Authorized by:   Talbert Cage MD   Signed by:   Talbert Cage MD on 11/03/2009   Method used:   Electronically to        [facility] [phone]* (retail)       [address]       [place], [place]  [address]       Ph: [phone]       Fax: [phone]   RxID:   [id number]

## 2010-05-19 NOTE — Progress Notes (Signed)
Summary: Rx Req  Phone Note Call from Patient Call back at Home Phone [phone]   Caller: Patient Summary of Call: Contour Meter is what she has.  She needs to get some strips to check her sugar.  [facility] [address] Initial call taken by: Raymond Gurney,  December 24, 2009 2:58 PM  Follow-up for Phone Call        Pt notifid rx at pharmacy. Follow-up by: Raymond Gurney,  December 26, 2009 10:40 AM    New/Updated Medications: BAYER CONTOUR TEST  STRP (GLUCOSE BLOOD) Check blood sugars first thing in the morning (fasting) and two hours after your biggest meal. Disp 1 month supply Prescriptions: BAYER CONTOUR TEST  STRP (GLUCOSE BLOOD) Check blood sugars first thing in the morning (fasting) and two hours after your biggest meal. Disp 1 month supply  #1 x 3   Entered and Authorized by:   Mariana Arn  MD   Signed by:   Mariana Arn  MD on 12/25/2009   Method used:   Electronically to        [facility] [phone]* (retail)       [address]       [place], [place]  [address]       Ph: [phone]       Fax: [phone]   RxIDDR:[id number]

## 2010-05-19 NOTE — Progress Notes (Signed)
Summary: triage  Phone Note Call from Patient Call back at Home Phone [phone]   Caller: Patient Summary of Call: Thinks she has gout in her toe. Initial call taken by: Raymond Gurney,  November 03, 2009 2:12 PM  Follow-up for Phone Call        states she has a hx of gout. has no meds & has not taken any OTC. she will be here by 3 to see a doctor Follow-up by: Elige Radon RN,  November 03, 2009 2:14 PM

## 2010-05-21 NOTE — Letter (Signed)
Summary: Generic Letter  [facility]  [address]   [place], [place] [address]   Phone: [phone]  Fax: [phone]    05/13/2010  [place], [place]  [address]  Dear Ms. Ronnald Ramp,  We are happy to let you know that since you are covered under Medicare you are able to have a FREE visit at the [facility] to discuss your HEALTH. This is a new benefit for Medicare.  There will be no co-payment.  At this visit you will meet with Lamont Dowdy an expert in wellness and the health coach at our clinic.  At this visit we will discuss ways to keep you healthy and feeling well.  This visit will not replace your regular doctor visit and we cannot refill medications.     You will need to plan to be here at least one hour to talk about your medical history, your current status, review all of your medications, and discuss your future plans for your health.  This information will be entered into your record for your doctor to have and review.  If you are interested in staying healthy, this type of visit can help.  Please call the office at: [phone], to schedule a "Medicare Wellness Visit".  The day of the visit you should bring in all of your medications, including any vitamins, herbs, over the counter products you take.  Make a list of all the other doctors that you see, so we know who they are. If you have any other health documents please bring them.  We look forward to helping you stay healthy.  Sincerely,   Suzanne Lineberry Grainger

## 2010-05-21 NOTE — Progress Notes (Signed)
Summary: refill  Phone Note Refill Request Call back at Home Phone [phone] Message from:  Patient  Refills Requested: Medication #1:  CARVEDILOL 6.25 MG TABS 1 by mouth two times a day Initial call taken by: Audie Clear,  April 06, 2010 10:10 AM    Prescriptions: CARVEDILOL 6.25 MG TABS (CARVEDILOL) 1 by mouth two times a day  #60 x 3   Entered and Authorized by:   Mariana Arn  MD   Signed by:   Mariana Arn  MD on 04/06/2010   Method used:   Electronically to        [facility] [phone]* (retail)       [address]       [place], [place]  [address]       Ph: [phone]       Fax: [phone]   RxID:   [id number]

## 2010-05-21 NOTE — Assessment & Plan Note (Signed)
Summary: f/u eo   Vital Signs:  Patient profile:   70 year old female Height:      63 inches Weight:      263.4 pounds BMI:     46.83 Temp:     98.7 degrees F oral Pulse rate:   78 / minute BP sitting:   152 / 78  (left arm) Cuff size:   large  Vitals Entered By: Levert Feinstein LPN (January 11, [id number] 11:14 AM) CC: f/u dm, bp Is Patient Diabetic? Yes Did you bring your meter with you today? No Pain Assessment Patient in pain? no        Primary Care Provider:  Mariana Arn  MD  CC:  f/u dm and bp.  History of Present Illness: 1) DM2: Last A1C 10.2 in August 2011. A1C 8.5 today. Has not been following diabetic diet over the holidays. Takes 40 units of Lantus each evening, but reports that she does not take it on days that she does not eat enough. CBGs 80-120 fasting (high of 190's, low of 60's twice - hypoglycemic symptoms "jittery" when she is in the 60's = drinks juice. Walks 15 minutes 3 times a week. Also on Glipizide as below. Checks feet every day. Last ophtho visit was 2011. Understands symptoms of hypoglycemia.   2) HTN: Not at goal today. Taking all antihypertensives without side effects. Does not check blood pressure or monitor salt intake. Has not been following DASH diet over the holidays.   ROS: Denies chest pain, dyspnea, polyuria, polydipsia, vision change, LE edema or loss of sensation.   Habits & Providers  Alcohol-Tobacco-Diet     Tobacco Status: never  Current Medications (verified): 1)  Bayer Childrens Aspirin 81 Mg Chew (Aspirin) .... Take 1 Tablet By Mouth Once A Day 2)  Accupril 40 Mg Tabs (Quinapril Hcl) .Marland Kitchen.. 1 By Mouth Bid For Blood Pressure 3)  Lipitor 40 Mg Tabs (Atorvastatin Calcium) .... By Mouth At Bedtime For Cholesterol 4)  Lantus Solostar 100 Unit/ml  Soln (Insulin Glargine) .... As Directed  40 Units Each Evening.  Disp 90 Day Supply. 5)  Furosemide 80 Mg Tabs (Furosemide) .Marland Kitchen.. 1 By Mouth Two Times A Day For Fluid/blood Pressure 6)  Colace 100  Mg  Caps (Docusate Sodium) .Marland Kitchen.. 1 Tab By Mouth Bid 7)  Glipizide 10 Mg Tabs (Glipizide) .Marland Kitchen.. 1 By Mouth Two Times A Day 8)  Ferrous Sulfate 325 (65 Fe) Mg  Tbec (Ferrous Sulfate) .Marland Kitchen.. 1 Tab By Mouth Daily 9)  One Daily Womens  Tabs (Multiple Vitamins-Minerals) .Marland Kitchen.. 1 Daily 10)  Carvedilol 6.25 Mg Tabs (Carvedilol) .Marland Kitchen.. 1 By Mouth Two Times A Day 11)  Norvasc 10 Mg Tabs (Amlodipine Besylate) .Marland Kitchen.. 1 By Mouth Once Daily For Blood Pressure 12)  1st Choice Pen Needles 31g X 6 Mm Misc (Insulin Pen Needle) .... Disp Qs For Once Daily Insulin With Lantus Solostar Pen. 13)  Cetirizine Hcl 10 Mg Tabs (Cetirizine Hcl) .... Once Daily As Needed For Allergies 14)  Tramadol Hcl 50 Mg Tabs (Tramadol Hcl) .Marland Kitchen.. 1-2 Every 6 Hours As Needed Pain 15)  Bayer Contour Test  Strp (Glucose Blood) .... Check Blood Sugars First Thing in The Morning (Fasting) and Two Hours After Your Biggest Meal. Disp 1 Month Supply  Allergies (verified): No Known Drug Allergies  Physical Exam  General:  obese, NAD, pleasant  Lungs:  Normal respiratory effort, chest expands symmetrically. Lungs are clear to auscultation, no crackles or wheezes. Heart:  Normal rate and regular  rhythm. S1 and S2 normal without gallop, murmur, click, rub or other extra sounds.   Impression & Recommendations:  Problem # 1:  DIABETES MELLITUS II, UNCOMPLICATED ([id number]) Assessment Unchanged Improved control today, though not at goal. Advised regarding adherence to medication regimen and to diabetic diet. Patient has increased her exercise since her last visit and maintained her weight in spite of dietary non-adherence - advised to try to get back on track.   Her updated medication list for this problem includes:    Bayer Childrens Aspirin 81 Mg Chew (Aspirin) .Marland Kitchen... Take 1 tablet by mouth once a day    Accupril 40 Mg Tabs (Quinapril hcl) .Marland Kitchen... 1 by mouth bid for blood pressure    Lantus Solostar 100 Unit/ml Soln (Insulin glargine) .Marland Kitchen... As directed   40 units each evening.  disp 90 day supply.    Glipizide 10 Mg Tabs (Glipizide) .Marland Kitchen... 1 by mouth two times a day  Orders: A1C-[facility] [id number]) [facility]- Est  Level 4 [id number])  Labs Reviewed: Creat: 2.35 (09/26/2009)   Microalbumin: 2+ (01/16/2007)  Last Eye Exam: unknown (07/18/2009) Reviewed HgBA1c results: 8.5 (04/29/2010)  10.2 (12/17/2009)  Problem # 2:  HYPERTENSION, BENIGN SYSTEMIC (ICD-401.1) Assessment: Deteriorated  Not at goal. Did not take medications today. Has not been adhering to dietary or exercise recommendations. Continue current medications. Reviewed DASH diet, exercise. Follow up in three months.   Her updated medication list for this problem includes:    Accupril 40 Mg Tabs (Quinapril hcl) .Marland Kitchen... 1 by mouth bid for blood pressure    Furosemide 80 Mg Tabs (Furosemide) .Marland Kitchen... 1 by mouth two times a day for fluid/blood pressure    Carvedilol 6.25 Mg Tabs (Carvedilol) .Marland Kitchen... 1 by mouth two times a day    Norvasc 10 Mg Tabs (Amlodipine besylate) .Marland Kitchen... 1 by mouth once daily for blood pressure  BP today: 131/71 Prior BP: 148/78 (11/03/2009)  Prior 10 Yr Risk Heart Disease: 13 % (07/14/2006)  Labs Reviewed: K+: 4.8 (09/26/2009) Creat: : 2.35 (09/26/2009)   Chol: 146 (04/23/2009)   HDL: 68 (04/23/2009)   LDL: 60 (04/23/2009)   TG: 92 (04/23/2009)  Orders: [place]- Est  Level 4 [id number])  Complete Medication List: 1)  Bayer Childrens Aspirin 81 Mg Chew (Aspirin) .... Take 1 tablet by mouth once a day 2)  Accupril 40 Mg Tabs (Quinapril hcl) .Marland Kitchen.. 1 by mouth bid for blood pressure 3)  Lipitor 40 Mg Tabs (Atorvastatin calcium) .... By mouth at bedtime for cholesterol 4)  Lantus Solostar 100 Unit/ml Soln (Insulin glargine) .... As directed  40 units each evening.  disp 90 day supply. 5)  Furosemide 80 Mg Tabs (Furosemide) .Marland Kitchen.. 1 by mouth two times a day for fluid/blood pressure 6)  Colace 100 Mg Caps (Docusate sodium) .Marland Kitchen.. 1 tab by mouth bid 7)  Glipizide 10 Mg Tabs (Glipizide) .Marland Kitchen.. 1 by  mouth two times a day 8)  Ferrous Sulfate 325 (65 Fe) Mg Tbec (Ferrous sulfate) .Marland Kitchen.. 1 tab by mouth daily 9)  One Daily Womens Tabs (Multiple vitamins-minerals) .Marland Kitchen.. 1 daily 10)  Carvedilol 6.25 Mg Tabs (Carvedilol) .Marland Kitchen.. 1 by mouth two times a day 11)  Norvasc 10 Mg Tabs (Amlodipine besylate) .Marland Kitchen.. 1 by mouth once daily for blood pressure 12)  1st Choice Pen Needles 31g X 6 Mm Misc (Insulin pen needle) .... Disp qs for once daily insulin with lantus solostar pen. 13)  Cetirizine Hcl 10 Mg Tabs (Cetirizine hcl) .... Once daily as needed for allergies 14)  Tramadol  Hcl 50 Mg Tabs (Tramadol hcl) .Marland Kitchen.. 1-2 every 6 hours as needed pain 15)  Bayer Contour Test Strp (Glucose blood) .... Check blood sugars first thing in the morning (fasting) and two hours after your biggest meal. disp 1 month supply   Orders Added: 1)  A1C-[facility] [83036] 2)  [facility]- Est  Level 4 [id number]    Laboratory Results   Blood Tests   Date/Time Received: April 29, 2010 11:08 AM  Date/Time Reported: April 29, 2010 11:21 AM   HGBA1C: 8.5%   (Normal Range: Non-Diabetic - 3-6%   Control Diabetic - 6-8%)  Comments: ...............test performed by.................Marland KitchenLevert Feinstein, LPN .............entered by...........Marland KitchenBonnie A. Martinique, MLS (ASCP)cm

## 2010-05-21 NOTE — Progress Notes (Signed)
Summary: Rx  Phone Note Refill Request Call back at Home Phone [phone]   Refills Requested: Medication #1:  LANTUS SOLOSTAR 100 UNIT/ML  SOLN as directed  40 units each evening.  Disp 90 day supply. Initial call taken by: Samara Snide,  April 17, 2010 12:15 PM    Prescriptions: LANTUS SOLOSTAR 100 UNIT/ML  SOLN (INSULIN GLARGINE) as directed  40 units each evening.  Disp 90 day supply.  #1 x 1   Entered and Authorized by:   Mylinda Latina MD   Signed by:   Mylinda Latina MD on 04/17/2010   Method used:   Electronically to        [facility] [phone] (retail)       [address]       [place],   [address]       Ph: [phone]       Fax: [phone]   RxID:   [id number]

## 2010-06-04 NOTE — Consult Note (Signed)
Summary: [facility]   Imported By: Audie Clear 05/22/2010 12:37:04  _____________________________________________________________________  External Attachment:    Type:   Image     Comment:   External Document

## 2010-07-10 LAB — POCT I-STAT, CHEM 8
Calcium, Ion: 0.99 mmol/L — ABNORMAL LOW (ref 1.12–1.32)
Glucose, Bld: 245 mg/dL — ABNORMAL HIGH (ref 70–99)
HCT: 33 % — ABNORMAL LOW (ref 36.0–46.0)
Hemoglobin: 11.2 g/dL — ABNORMAL LOW (ref 12.0–15.0)
TCO2: 24 mmol/L (ref 0–100)

## 2010-07-10 LAB — GLUCOSE, CAPILLARY
Glucose-Capillary: 237 mg/dL — ABNORMAL HIGH (ref 70–99)
Glucose-Capillary: 238 mg/dL — ABNORMAL HIGH (ref 70–99)

## 2010-07-10 LAB — DIFFERENTIAL
Basophils Absolute: 0 10*3/uL (ref 0.0–0.1)
Eosinophils Relative: 1 % (ref 0–5)
Lymphocytes Relative: 12 % (ref 12–46)
Lymphs Abs: 0.7 10*3/uL (ref 0.7–4.0)
Monocytes Absolute: 0.5 10*3/uL (ref 0.1–1.0)

## 2010-07-10 LAB — CBC
HCT: 34.2 % — ABNORMAL LOW (ref 36.0–46.0)
Hemoglobin: 11.3 g/dL — ABNORMAL LOW (ref 12.0–15.0)
WBC: 5.6 10*3/uL (ref 4.0–10.5)

## 2010-07-10 LAB — BRAIN NATRIURETIC PEPTIDE: Pro B Natriuretic peptide (BNP): 83 pg/mL (ref 0.0–100.0)

## 2010-07-10 LAB — POCT CARDIAC MARKERS: Troponin i, poc: <0.05 ng/mL (ref 0.00–0.09)

## 2010-08-06 ENCOUNTER — Other Ambulatory Visit: Payer: Self-pay | Admitting: Family Medicine

## 2010-08-06 NOTE — Telephone Encounter (Signed)
Refill request

## 2010-09-01 NOTE — Discharge Summary (Signed)
NAME:  Rachel Hobbs, Rachel [name]:  [id number]   MEDICAL RECORD NO.:  [id number]          PATIENT TYPE:  INP   LOCATION:  71                         FACILITY:  [facility]   PHYSICIAN:  Edythe Lynn, M.D.       DATE OF BIRTH:  1940/10/24   DATE OF ADMISSION:  01/30/2007  DATE OF DISCHARGE:                               DISCHARGE SUMMARY   PRIMARY CARE PHYSICIAN:  With the [facility].   DISCHARGE DIAGNOSES:  1. Small-bowel obstruction secondary to incarcerated hernia, status      post hernia repair and lysis of adhesions  2. Hypertension, stage III.  3. Diabetes mellitus, type 2.  4. Morbid obesity.  5. Hyperlipidemia.  6. Chronic kidney disease, stage III.  7. Anemia of chronic disease.  8. Postoperative ileus -- resolved  9. Postoperative acute renal failure -- resolved.  10.Weakness and deconditioning.   DISCHARGE MEDICATIONS:  1. Zocor 80 mg daily.  2. Lisinopril 20 mg daily.  3. Aspirin 81 mg daily.  4. Norvasc 10 mg daily.  5. Lasix 60 mg daily.  6. Coreg 25 mg twice a day.  7. Protonix 40 mg daily.  8. Lantus 15 units at bedtime.  9. NovoLog sliding scale 3 times a day with each meal.   CONDITION ON DISCHARGE:  Rachel Hobbs will be transferred to a skilled  nursing facility for short-term rehabilitation.  Please make sure the  patient has a followup basic metabolic profile.  The patient will have  to be transported for a followup appointment with Dr. Bubba Camp from  [facility].   CONSULTATION DURING THIS ADMISSION:  Dr. Bubba Camp from [facility]  Surgery.   PROCEDURE DURING THIS ADMISSION:  1. On January 31, 2007, the patient underwent computed tomography scan      of abdomen and pelvis with findings of incarcerated hernia and      small bowel obstruction.  2. On February 01, 2007, exploratory laparotomy, lysis of adhesions and      repair of the hernia by Dr. Bubba Camp.  3. PICC line placement on February 02, 2007 by  Interventional      Radiology.  4. From October 16 until February 08, 2007, the patient underwent total      parenteral nutrition.   HISTORY AND PHYSICAL:  For admission history and physical, refer to  dictated H&P done by Dr. Anastasio Champion on January 30, 2007.   HOSPITAL COURSE:  PROBLEM #1 - ABDOMINAL PAIN, NAUSEA AND VOMITING:  Ms.  Hobbs was admitted to [facility] with what appeared to be a  small-bowel obstruction.  She had a palpable ventral hernia.  The  patient was placed on intravenous fluids, antibiotics and n.p.o.  Her  status did not follow a course of improvement and for this reason, on  January 31, 2007, the patient had a CT scan of the abdomen and a  surgical consultation which was doen by Dr. Bubba Camp.  The surgical team took the patient on February 01, 2007 for an urgent  laparotomy, lysis of adhesions and hernia release.  Postoperatively, Ms.  Hobbs has had a slow recovery with the requirement for parenteral  nutrition, nasogastric suction, and opiate medications.  The patient's  diabetes was managed with doses of subcutaneous insulin and the  antihypertensives were held postoperatively.  As the patient made slow  recovery, her diet was advanced to a full liquid diet by February 08, 2007 and then eventually to a regular diet by February 10, 2007.  The  patient's TNA was discontinued and slowly but surely the  antihypertensives were resumed.  By February 11, 2007, the patient has  had a bowel movement, she is passing flatus, she is in no pain and she  has resumed all of her previous medications.  The patient has been seen  by the physical therapist and occupational therapist and it was felt  that she is weak and deconditioned and she could benefit from short-term  skilled nursing home placement.      Edythe Lynn, M.D.  Electronically Signed     SL/MEDQ  D:  02/12/2007  T:  02/13/2007  Job:  [id number]   cc:   Kathrin Penner, M.D.  [id number]  [place] [address]

## 2010-09-01 NOTE — Op Note (Signed)
NAME:  Rachel, [name]:  [id number]   MEDICAL RECORD NO.:  [id number]          PATIENT TYPE:  AMB   LOCATION:  DAY                          FACILITY:  [facility]   PHYSICIAN:  Kathrin Penner, M.D.   DATE OF BIRTH:  07-08-1940   DATE OF PROCEDURE:  01/29/2008  DATE OF DISCHARGE:                               OPERATIVE REPORT   PREOPERATIVE DIAGNOSIS:  Recurrent incarcerated ventral hernia.   POSTOPERATIVE DIAGNOSIS:  Recurrent incarcerated ventral hernia.   PROCEDURE:  Repair of recurrent incarcerated ventral hernia with MTS  mesh, extensive adhesiolysis (2 hours), small bowel resection.   SURGEON:  Kathrin Penner, M.D.   ASSISTANT:  Orson Ape. Rise Patience, M.D.   ANESTHESIA:  General.  Bowel incarceration with loss of abdominal  domain.   SPECIMENS TO LAB:  A segment of small intestine.   ESTIMATED BLOOD LOSS:  100 mL.   COMPLICATIONS:  None apparent.  The patient returned to the PACU in  excellent condition.   NOTE:  [name] is a 70 year old obese diabetic female.  She was  first operated on for an incarcerated abdominal wall hernia with micro  perforation of the intestine.  She underwent primary repair and closure  of her micro perforations.  The hernia has since recurred and the  patient comes to the operating room now for definitive repair.   The risks and potential benefits of surgery have been fully discussed  with the patient.  She understands and she gives her consent to surgery.   PROCEDURE IN DETAIL:  The patient is positioned supinely and following  the induction of satisfactory general anesthesia a Foley catheter was  placed in the urinary bladder.  The abdomen was prepped and draped to be  included in a sterile operative field.  Preoperative checkout identifies  the patient is Rachel Hobbs and the procedure to be done is repair of  recurrent incarcerated ventral hernia.  Preoperative antibiotics had  been given and the operating room  team has been identified and present.   Long midline incision with an elliptical incision removing the old scar  cicatrix is made.  The abdomen is entered and dissection carried over  towards the left portion of the abdomen where the large hernia sac is  located.  The hernia sac is opened and an extensive adhesiolysis to  remove both the transverse colon and much of the small intestine  incarcerated within the hernia down.  This adhesiolysis took  approximately 2 hours.  Towards the distal ileum portions of the hernia  sac had to be taken down with the intestine.  This portion of intestine  was resected and a GIA stapled anastomosis was carried out.  The  mesentery was secured with 3-0 silk sutures.  The resulting anastomosis  was noted to be widely patent.  The very large hernia sac was then  repaired by a 16 x 20 inch piece of MTS biologic graft sewn in with #1  Novofil sutures placed in an underlay fashion and U stitches.  The  fascial defect could not be closed above the hernia,  above the MTS mesh.  The large redundant panniculus was then resected and discarded.  Sponge,  instrument and sharp counts were verified.  The subcutaneous tissues  were closed with 2-0 running Vicryl sutures after a 19 [place] Blake  drain was placed in the large dead space for additional drainage.  The  skin incision was then closed with staples and a sterile dressing then  applied.  The anesthetic reversed and the patient removed from the  operating room to the recovery room in stable condition.  She tolerated  the procedure well.      Kathrin Penner, M.D.  Electronically Signed     PB/MEDQ  D:  01/29/2008  T:  01/29/2008  Job:  [id number]

## 2010-09-01 NOTE — H&P (Signed)
NAME:  Rachel Hobbs, Rachel [name]:  [id number]   MEDICAL RECORD NO.:  [id number]          PATIENT TYPE:  INP   LOCATION:  [id number]                         FACILITY:  [facility]   PHYSICIAN:  Kathrin Penner, M.D.   DATE OF BIRTH:  1940-10-20   DATE OF ADMISSION:  02/15/2007  DATE OF DISCHARGE:                              HISTORY & PHYSICAL   HISTORY:  [name] is a 70 year old African-American female  discharged from [facility] on Monday, February 13, 2007,  following repair of an incarcerated ventral hernia with large bowel  entrapment.  Her procedure was performed on February 01, 2007.  She was  discharged to a skilled nursing facility for reconditioning.  While  undergoing reconditioning exercises, her abdominal wound dehisced with  significant discharging fluids.  The patient was then seen in the  emergency room and was advised to follow up with the [facility]  Surgery office within 24 hours which is today.  The patient requires  admission and treatment.   The patient has no complaints of fever or rigors.  She is not having any  significant pain.  Cultures done in the ER show rare Gram-negative rods  and Gram-negative cocci.  Her white blood cell count is 11.6, hemoglobin  11.0.  Her complete metabolic panel is within normal limits except for a  glucose of 161.  Her serum creatinine is 1.44 reflective of her chronic  kidney disease.   PAST MEDICAL HISTORY:  1. Ventral hernia repair for incarcerated transverse colon.  2. Total abdominohysterectomy, remote.  3. Hypertension.  4. Type 2 diabetes mellitus.  5. Morbid obesity.  6. Hyperlipidemia.  7. Chronic kidney disease.  8. Deconditioning.   ALLERGIES:  No known drug allergies.   MEDICATIONS:  1. Zocor 80 mg daily.  2. Lisinopril 20 mg daily.  3. Aspirin 81 mg daily.  4. Norvasc 10 mg daily.  5. Lasix 60 mg daily.  6. Coreg 25 mg b.i.d.  7. Protonix 40 mg daily.  8. Lantus 15 units q.h.s.  9. NovoLog a.c. t.i.d. sliding scale.   SOCIAL HISTORY:  Patient is transferred from skilled nursing facility.  She is here with her daughter.  She has no history of tobacco or alcohol  use.   REVIEW OF SYSTEMS:  Negative except as detailed above.   FAMILY HISTORY:  Noncontributory.   PHYSICAL EXAMINATION:  GENERAL:  Alert, oriented, no acute distress.  VITAL SIGNS:  Pulse 77, blood pressure 161/76, temperature 98.8.  HEENT:  Oropharynx benign.  No thyromegaly.  No cervical nodes.  LUNGS:  Clear to auscultation.  HEART:  Regular rate and rhythm.  No murmurs.  ABDOMEN:  Open wound with no exudate and no odor, no drainage, some  small pocketing, good granulation and fascia is intact.  EXTREMITIES:  No clubbing, cyanosis or edema.  Decreased range of motion  secondary to osteoarthritis and obesity.  NEUROLOGIC EXAMINATION:  Moves all four limbs without any lateralizing  deficits.   ASSESSMENT:  1. Wound dehiscence with intact fascia.  2. Diabetes mellitus.  3. Hypertension.  4. Chronic kidney  disease.   PLAN:  Admission, IV antibiotics, probably will be placed on a wound VAC  and will start on daily dressing changes.      Kathrin Penner, M.D.  Electronically Signed     PB/MEDQ  D:  02/15/2007  T:  02/16/2007  Job:  [id number]

## 2010-09-01 NOTE — Op Note (Signed)
NAMEMarland Kitchen  [name], [name]:  [id number]   MEDICAL RECORD NO.:  [id number]          PATIENT TYPE:  INP   LOCATION:  22                         [facility]ACILITY:  [facility]   PHYSICIAN:  Kathrin Penner, M.D.   DATE OF BIRTH:  29-Sep-1940   DATE OF PROCEDURE:  02/01/2007  DATE OF DISCHARGE:                               OPERATIVE REPORT   PREOPERATIVE DIAGNOSIS:  Large bowel obstruction with incarcerated  ventral hernia.   POSTOPERATIVE DIAGNOSIS:  Large bowel obstruction with incarcerated  ventral hernia.   PROCEDURE:  Exploratory laparotomy, adhesiolysis, with reduction of  incarcerated ventral hernia and ventral hernia repair with component  separation.   SURGEON:  Kathrin Penner, M.D.   ASSISTANT:  Haywood Lasso, M.D.   ANESTHESIA:  General.   INDICATIONS FOR PROCEDURE:  The patient is a 70 year old female  presenting with nausea, vomiting, and abdominal distention with a  palpable hernia in the paraumbilical region.  KUB showed an area of  partial bowel obstruction.  CT scan confirmed that area of bowel  obstruction was at the hernia which had a portion of the transverse  colon in incarcerated within it.  The patient is brought to the  operating room now after risks and potential benefits of surgery have  been fully discussed, questions answered, and consent obtained for same.   PROCEDURE:  With the patient positioned supinely and following the  induction of satisfactory general anesthesia, a midline incision was  carried down through an old scar cicatrix from her previous total  abdominal hysterectomy, deepened through the skin and subcutaneous  tissue down to the linea alba.  The abdomen was carefully opened to  reveal a very large incarcerated ventral hernia at the paramedian area.  The hernia was opened so as to release the large intestine.  Multiple  adhesions around the hernia and down into the pelvis were released.  The  portion of incarceration also  showed an area of very large wide-mouth  colonic diverticulum which showed no evidence of ischemia or  perforation.  This was left alone.  Following adhesiolysis, the colon  was reintroduced into the abdomen.  Upon reviewing the hernia, the area  of defect was too large to be closed primarily, so we did a component  separation extending from the costal margin down to the iliac crest on  both sides.  Following this, the components of the abdominal wall could  be brought together, and this was closed with a running suture of #1  Novofil.  I opted not to place any onlay mesh on this area because of  all of the inflammation involving this large hernia.  The flaps were  then irrigated with multiple aliquots of normal saline.  All areas of  dissection were checked for hemostasis and noted to be dry.  A 19-French  Blake drain was placed within the wounds to the drain the flaps.  The  subcutaneous tissues  were then closed with a running 2-0 Vicryl suture.  The skin was closed  with staples.  A sterile dressing was applied, the anesthetic reversed,  and  the patient removed from the operating room to the recovery room in  stable condition.  She tolerated the procedure well.      Kathrin Penner, M.D.  Electronically Signed     PB/MEDQ  D:  02/01/2007  T:  02/02/2007  Job:  [id number]

## 2010-09-01 NOTE — Discharge Summary (Signed)
NAME:  Rachel Hobbs, Rachel [name]:  [id number]   MEDICAL RECORD NO.:  [id number]          PATIENT TYPE:  INP   LOCATION:  1523                         FACILITY:  [facility]   PHYSICIAN:  Kathrin Penner, M.D.   DATE OF BIRTH:  Jul 05, 1940   DATE OF ADMISSION:  02/15/2007  DATE OF DISCHARGE:  02/21/2007                               DISCHARGE SUMMARY   ADMISSION DIAGNOSES:  1. Status post ventral hernia repair for incarcerated transverse colon      with wound dehiscence.  2. Status post total abdominal hysterectomy.  3. Hypertension.  4. Type 2 diabetes.  5. Morbid obesity.  6. Hyperlipidemia.  7. Chronic kidney disease.  8. Deconditioning.   DISCHARGE DIAGNOSES:  1. Status post ventral hernia repair for incarcerated transverse colon      with wound dehiscence.  2. Status post total abdominal hysterectomy.  3. Hypertension.  4. Type 2 diabetes.  5. Morbid obesity.  6. Hyperlipidemia.  7. Chronic kidney disease.  8. Deconditioning.   PROCEDURES IN HOSPITAL:  1. Wound packing.  2. Wound vac placement.   COMPLICATIONS:  None.   CONDITION ON DISCHARGE:  Improved.   DISCHARGE MEDICATIONS:  1. Zocor 80 mg daily.  2. Lisinopril 20 mg daily.  3. Aspirin 81 mg daily.  4. Norvasc 10 mg daily.  5. Lasix 60 mg daily.  6. Coreg 25 mg b.i.d.  7. Protonix 40 mg daily.  8. Lantus 15 units at bedtime.  9. NovoLog AC t.i.d. on a sliding scale.  10.Cipro 500 mg p.o. b.i.d.   HISTORY AND HOSPITAL COURSE:  [name] is a 70 year old morbidly  obese African-American female who underwent repair of incarcerated  ventral hernia on February 01, 2007.  She did well postoperatively and  was discharged from the hospital on February 13, 2007.  She was  discharged to a nursing care facility for rehabilitation and during the  course of her rehabilitation exercises her wound dehisced and she was  then brought back to the hospital where she was admitted for care.  Cultures of the  dehisced wound showed a Citrobacter species sensitive to  ciprofloxacin.  The patient was treated initially and empirically with  Unasyn 1.5 grams q.6h.  Following return of cultures, she was switched  over to p.o. ciprofloxacin.  At the time of discharge her vac is  working, she is tolerating her usual diet.  Her vital signs show her to  be afebrile with temperature of 98.7, her pulse is 80, respirations 18,  blood pressure 141/72.  The O2 sats are 95.  Her most recent CBG is  running at 217.  This is being covered by her insulin sliding scale.   LABORATORY EVALUATION:  Shows her BMET to be normal with a sodium of  134, potassium 4.3, chloride 100, CO2 26, glucose of 224, BUN 15 and  creatinine 1.4.  CBC close to the time of discharge shows a white count  of 9.3 with a hemoglobin of 9.6 and a hematocrit of 27.9, platelet count  427,000.   The patient is being discharged to the  nursing care center where she  will continue wound changes and vac changes on an every 3 day basis.  She will be followed up in the office at [facility] in  approximately 2 weeks.      Kathrin Penner, M.D.  Electronically Signed     PB/MEDQ  D:  02/21/2007  T:  02/21/2007  Job:  [id number]

## 2010-09-01 NOTE — H&P (Signed)
NAME:  Rachel Hobbs, Rachel Hobbs                 ACCOUNT NO.:  [id number]   MEDICAL RECORD NO.:  [id number]          PATIENT TYPE:  INP   LOCATION:  1502                         FACILITY:  [facility]   PHYSICIAN:  Doree Albee, M.D.DATE OF BIRTH:  December 30, 1940   DATE OF ADMISSION:  01/30/2007  DATE OF DISCHARGE:                              HISTORY & PHYSICAL   HISTORY:  This is a 70 year old 26 lady who gives a two to  three-day history of cramping abdominal pain associated with a one-day  history of nausea and vomiting.  She says she has opened her bowels  today.  Her abdomen has become distended.   PAST MEDICAL HISTORY:  1. Type 2 insulin-dependent diabetes mellitus.  2. Hypertension.  3. Hyperlipidemia.  4. Chronic kidney disease for which she sees a nephrologist, Dr.      Louis Meckel.  Previous BUN 26, creatinine 1.61      documented earlier this year.   CURRENT MEDICATIONS:  1. Norvasc 10 mg daily.  2. Glipizide 10 mg daily.  3. Aspirin 81 mg daily.  4. Lisinopril 20 mg daily.  5. Simvastatin 80 mg daily.  6. Lasix 40 mg daily.  7. Lantus insulin 25 units daily.  8. Januvia, dose unclear.  9. Metoprolol, dose unclear.   ALLERGIES:  No known drug allergies.   FAMILY HISTORY:  Noncontributory.   PAST SURGICAL HISTORY:  1. Partial hysterectomy in the 1970's.  2. Previous repair of umbilical hernia.   PHYSICAL EXAMINATION:  VITAL SIGNS:  Temperature 98.4 degrees, blood  pressure 109/58, pulse 82, respirations 12, saturation 97%.  GENERAL:  She does not look significantly clinically dehydrated.  She  does not look toxic.  CARDIOVASCULAR:  Heart sounds present and normal.  LUNGS:  Lung fields are clear.  ABDOMEN:  Distended and there is an umbilical hernia which appears to be  reducible.  Bowel sounds are present.  There is no clear tenderness in  the whole of the abdomen.  There is no obvious hepatosplenomegaly.  NEUROLOGIC:  She is alert and oriented,  with no focal neurologic signs.   INVESTIGATIONS:  An abdominal x-ray suggestive of a small bowel  obstruction.   Hemoglobin 11.4, white blood cell count 8.2, platelets 402.  Sodium 139,  potassium 4.4, chloride 105, bicarbonate 23, glucose 184, BUN 48,  creatinine 2.6, lipase 18.   IMPRESSION:  1. Small bowel obstruction.  2. Type 2 insulin-dependent diabetes mellitus.  3. Hypertension.  4. Hyperlipidemia.  5. Chronic kidney disease.   PLAN:  1. Admit.  2. To be n.p.o., intravenous fluids, NG tube.  3. Control diabetes and hypertension.  4. Monitor renal function.  5. Consider surgical consultation if she does not improve.  6. Follow with abdominal x-rays.  7. Further recommendations will depend upon the patient's hospital      progress.      Doree Albee, M.D.  Electronically Signed     NCG/MEDQ  D:  01/30/2007  T:  01/31/2007  Job:  [id number]

## 2010-09-01 NOTE — Consult Note (Signed)
NAMEMarland Hobbs  MALI, WALP NO.:  [id number]   MEDICAL RECORD NO.:  [id number]          PATIENT TYPE:  INP   LOCATION:  1502                         FACILITY:  [facility]   PHYSICIAN:  Kathrin Penner, M.D.   DATE OF BIRTH:  [birth year]   DATE OF CONSULTATION:  DATE OF DISCHARGE:                                 CONSULTATION   Problem: small bowel obstruction.   HISTORY:  The patient is an African-American female  with a history of  abdominal pain, distention, and vomiting.  She has not had any flatus in  the past 2 days Admission abdominal film shows small bowel obstruction,  she had a small amount of gas in her colon.  The patient is status post total abdominal hysterectomy   PAST MEDICAL HISTORY:  Chronic renal insufficiency showing a creatinine  elevation above 3 which has been previously 1.6.  Her BUN is about 55  with an estimated glomerular filtration rate of 19.  Liver function  studies are all within normal limits.   Insulin-dependent diabetes mellitus and hypertension.   CURRENT MEDICATIONS:  Include Norvasc, glipizide, aspirin, lisinopril,  simvastatin, Lasix, insulin, Januvia and metoprolol.   Allergies NKDA   PHYSICAL EXAMINATION:  This is a non-toxic-appearing obese female.  She  is afebrile.  Her pulse is 80, respirations 18, blood pressure 124/69,  and her O2 saturation is 97 on room air.  HEENT:  No thyromegaly or adenopathy.  CHEST:  Clear to auscultation anteriorly.  HEART:  Regular rate and rhythm without murmurs heard.  ABDOMEN:  Soft, slightly distended.  There is a nontender ventral hernia  presenting to the right of the lower midline incision which is only  partially reducible but certainly nontender.  Her bowel sounds however  are somewhat high-pitched with tinkles.  EXTREMITY EXAM:  Shows no calf tenderness, no lymphedema.   ASSESSMENT:  Partial to high-grade small bowel obstruction.  The patient  had a CT scan today which is not currently  available through the  computer system.  It would be interesting to see what it shows.  It is  most likely that the patient will need exploratory laparotomy for relief  of her small bowel obstruction.  She is however quite reluctant to  commit to that at this time.  I will continue to follow her with you.     Kathrin Penner, M.D.  Electronically Signed    PB/MEDQ  D:  01/31/2007  T:  02/01/2007  Job:  [id number]

## 2010-09-04 NOTE — Discharge Summary (Signed)
NAMEMarland Kitchen  Rachel Hobbs, Rachel [name]:  [id number]   MEDICAL RECORD NO.:  [id number]          PATIENT TYPE:  INP   LOCATION:  1540                         FACILITY:  [facility]   PHYSICIAN:  Kathrin Penner, M.D.   DATE OF BIRTH:  Jan 17, 1941   DATE OF ADMISSION:  01/29/2008  DATE OF DISCHARGE:  02/04/2008                               DISCHARGE SUMMARY   ADMISSION DIAGNOSES:  1. Incarcerated ventral hernia.  2. Diabetes mellitus type 2.  3. Hypertension.  4. Chronic renal insufficiency.  5. Morbid obesity.   DISCHARGE MEDICATIONS:  1. Vicodin 5/500 one-two every 4 hours p.r.n.  2. The patient is instructed to continue her home prescriptions.   PROCEDURES/HOSPITAL COURSE:  On the day of admission, the patient was  taken to the operating room where she underwent exploratory laparotomy  with extensive adhesiolysis and repair of a recurrent incarcerated  ventral hernia.  This was performed with MTS mesh and component  separation. During the course of the operation, the patient underwent a  small bowel resection with primary anastomosis.  The patient's  postoperative course was  benign with resumption of bowel activity and  ambulation.  Her diabetes was brought under control by sliding-scale  insulin doses.  Her hypertension was not a problem during her  hospitalization.  She was not given any hypertensive medications while  in the hospital.   At the time of discharge, incision is healing well.  The patient has 2  Blake drains within her wound.  She is given instructions as to how to  care for these drains.  She will be followed by a visiting nurse service  for monitoring of diabetes, hypertension and wound care.  She is to  follow up with [place] in office in 1 week.      Kathrin Penner, M.D.  Electronically Signed     PB/MEDQ  D:  03/06/2008  T:  03/06/2008  Job:  [id number]   cc:   Kathrin Penner, M.D.  [id number] [place]  [place] [id number]

## 2010-09-25 ENCOUNTER — Ambulatory Visit (INDEPENDENT_AMBULATORY_CARE_PROVIDER_SITE_OTHER): Payer: Medicare Other | Admitting: Family Medicine

## 2010-09-25 VITALS — BP 148/73 | HR 61 | Temp 97.7°F | Ht 63.0 in | Wt 273.5 lb

## 2010-09-25 DIAGNOSIS — N19 Unspecified kidney failure: Secondary | ICD-10-CM

## 2010-09-25 DIAGNOSIS — E119 Type 2 diabetes mellitus without complications: Secondary | ICD-10-CM

## 2010-09-25 DIAGNOSIS — Z20828 Contact with and (suspected) exposure to other viral communicable diseases: Secondary | ICD-10-CM

## 2010-09-25 DIAGNOSIS — I10 Essential (primary) hypertension: Secondary | ICD-10-CM

## 2010-09-25 DIAGNOSIS — R609 Edema, unspecified: Secondary | ICD-10-CM

## 2010-09-25 LAB — BASIC METABOLIC PANEL
BUN: 43 mg/dL — ABNORMAL HIGH (ref 6–23)
CO2: 24 mEq/L (ref 19–32)
Chloride: 105 mEq/L (ref 96–112)
Creat: 2.8 mg/dL — ABNORMAL HIGH (ref 0.50–1.10)
Potassium: 5.3 mEq/L (ref 3.5–5.3)

## 2010-09-25 MED ORDER — QUINAPRIL HCL 40 MG PO TABS: 40.0000 mg | ORAL_TABLET | Freq: Two times a day (BID) | ORAL | Status: DC

## 2010-09-25 MED ORDER — GLIPIZIDE 10 MG PO TABS: 10.0000 mg | ORAL_TABLET | Freq: Two times a day (BID) | ORAL | Status: DC

## 2010-09-25 NOTE — Patient Instructions (Signed)
Follow up in one month. Take you fluid pill twice a day. Continue to check your blood sugars. Have a great day!  - Dr. Sherilyn Cooter

## 2010-09-26 NOTE — Progress Notes (Signed)
  Subjective:    Patient ID: Rachel Hobbs, female    DOB: [birth year], 70 y.o.   MRN: [id number]  HPI  1) DM2:  A1C 10.2 in August 2011. A1C 8.23 April 2010.  Takes 40 units of Lantus each evening, but reports that she does not take it on days that she does not eat enough. CBGs 80-120 fasting (high of 190's, low of 60's twice - hypoglycemic symptoms "jittery" when she is in the 60's = drinks juice. Walks 15 minutes 3 times a week. Also on Glipizide but is out of this medication x 1 week. Checks feet every day. Last ophtho visit was 2012 - no diabetic retinopathy (but bilateral cataracts). Understands symptoms of hypoglycemia.   2) HTN: Not at goal today. Taking all antihypertensives as below, however has only been taking her Lasix once a day due to increased urination. Does not check blood pressure or monitor salt intake. Reports worsening LE edema since she has only been taking her Lasix once. Tries to follow DASH diet. History of chronic kidney disease followed by Dr. Moshe Cipro (last Cr = 2.63 in January 2012 - has been steadily rising over past year from 1.93 in January 2011). Last seen by nephrologist in April 2012. Denies chest pain, dyspnea.   Pertinent past history reviewed   Review of Systems ROS: Denies chest pain, dyspnea, polyuria, polydipsia, vision change, LE edema or loss of sensation.     Objective:   Physical Exam  Constitutional: She is oriented to person, place, and time. She appears well-developed and well-nourished. No distress.  Neck: Normal range of motion. Neck supple. No JVD present.  Cardiovascular: Normal rate, regular rhythm, normal heart sounds and intact distal pulses.   No murmur heard. Pulmonary/Chest: Effort normal and breath sounds normal. No respiratory distress. She has no wheezes. She has no rales. She exhibits no tenderness.  Abdominal: Soft. Bowel sounds are normal.  Neurological: She is alert and oriented to person, place, and time.  Skin: Skin is warm  and dry. She is not diaphoretic.  Extremities: bilateral 2+ LE edema to knees         Assessment & Plan:

## 2010-09-26 NOTE — Assessment & Plan Note (Signed)
Not at goal. Advised restart Lasix. Follow up in three months. Will send notes to nephrologist. CMET today.

## 2010-09-26 NOTE — Assessment & Plan Note (Addendum)
Will check creatinine today. Concern that volume overload is contributing. Will restart Lasix twice daily. Will send record to nephrologist. Follow up in three months.

## 2010-09-26 NOTE — Assessment & Plan Note (Signed)
Deteriorated. Advised to restart Lasix. Follow up in three months.

## 2010-09-26 NOTE — Assessment & Plan Note (Signed)
A1C today. Advised regarding need for continued dietary modification and exercise. Follow up in three months.

## 2010-10-07 ENCOUNTER — Other Ambulatory Visit: Payer: Self-pay | Admitting: Family Medicine

## 2010-10-07 DIAGNOSIS — Z1231 Encounter for screening mammogram for malignant neoplasm of breast: Secondary | ICD-10-CM

## 2010-10-15 ENCOUNTER — Ambulatory Visit (INDEPENDENT_AMBULATORY_CARE_PROVIDER_SITE_OTHER): Payer: Medicare Other | Admitting: Home Health Services

## 2010-10-15 ENCOUNTER — Encounter: Payer: Self-pay | Admitting: Home Health Services

## 2010-10-15 VITALS — BP 167/52 | HR 83 | Temp 98.1°F | Ht 62.0 in | Wt 262.0 lb

## 2010-10-15 DIAGNOSIS — Z0000 Encounter for general adult medical examination without abnormal findings: Secondary | ICD-10-CM

## 2010-10-15 NOTE — Progress Notes (Signed)
Patient here for annual wellness visit, patient reports: Risk Factors/Conditions needing evaluation or treatment: Pt does not have any risk factors that need evaluation. Home Safety: Pt lives with daughter in 1 story home.  Pt reports having smoke detectors and does not have adaptive equipment in bathroom. Other Information: Corrective lens: Pt does not have corrective lens and visits eye doctor every 6 months for cataract monitoring. Dentures: Pt has full dentures. Memory: Pt denies memory problems.  Patient's Mini Mental Score (recorded in doc. flowsheet): 29  Balance/Gait:  Balance Abnormal Patient value  Sitting balance    Sit to stand    Attempts to arise    Immediate standing balance    Standing balance    Nudge    Eyes closed- Romberg    Tandem stance x Unable to do  Back lean    Neck Rotation    360 degree turn    Sitting down     Gait Abnormal Patient value  Initiation of gait    Step length-left    Step length-right    Step height-left    Step height-right    Step symmetry    Step continuity    Path deviation    Trunk movement x stiff  Walking stance        Annual Wellness Visit Requirements Recorded Today In  Medical, family, social history Past Medical, Family, Social History Section  Current providers Care team  Current medications Medications  Wt, BP, Ht, BMI Vital signs  Hearing assessment (welcome visit) Hearing/vision  Tobacco, alcohol, illicit drug use History  ADL Nurse Assessment  Depression Screening Nurse Assessment  Cognitive impairment Nurse Assessment  Mini Mental Status Document Flowsheet  Fall Risk Nurse Assessment  Home Safety Progress Note  End of Life Planning (welcome visit) Social Documentation  Medicare preventative services Progress Note  Risk factors/conditions needing evaluation/treatment Progress Note  Personalized health advice Patient Instructions, goals, letter  Diet & Exercise Social Documentation  Emergency Contact  Social Documentation  Seat Belts Social Documentation  Sun exposure/protection Social Documentation    Prevention Plan: Recommended pt contact pharmacy for shingles vaccine.   Recommended Medicare Prevention Screenings Women over 26 Test For Frequency Date of Last- BOLD if needed  Breast Cancer 1-2 yrs 7/11  Cervical Cancer 1-3 yrs hysterectomy  Colorectal Cancer 1-10 yrs Hemoccult card 2077  Osteoporosis once   Cholesterol 5 yrs 1/12  Diabetes yearly 6/12  HIV yearly declined  Influenza Shot yearly declined  Pneumonia Shot once declined  Zostavax Shot once recommended

## 2010-10-15 NOTE — Patient Instructions (Signed)
1. Focus on eating 3-4 vegetables a day. 2. Continue to work out at [employer] but also start going to [employer] 1 time a week. 3. Continue to take your blood sugar every morning before eating. 4. Continue working on losing weight. 5. Contact pharmacy for shingles vaccine.

## 2010-10-20 NOTE — Progress Notes (Signed)
I have reviewed this visit and discussed with Suzanne Lineberry and agree with her documentation  

## 2010-11-04 ENCOUNTER — Encounter: Payer: Self-pay | Admitting: Family Medicine

## 2010-11-04 ENCOUNTER — Ambulatory Visit (INDEPENDENT_AMBULATORY_CARE_PROVIDER_SITE_OTHER): Payer: Medicare Other | Admitting: Family Medicine

## 2010-11-04 VITALS — BP 146/74 | HR 73 | Temp 98.2°F | Wt 263.0 lb

## 2010-11-04 DIAGNOSIS — R609 Edema, unspecified: Secondary | ICD-10-CM

## 2010-11-04 DIAGNOSIS — I871 Compression of vein: Secondary | ICD-10-CM

## 2010-11-04 LAB — BASIC METABOLIC PANEL
BUN: 39 mg/dL — ABNORMAL HIGH (ref 6–23)
Creat: 2.92 mg/dL — ABNORMAL HIGH (ref 0.50–1.10)
Glucose, Bld: 72 mg/dL (ref 70–99)
Potassium: 4.9 mEq/L (ref 3.5–5.3)

## 2010-11-04 NOTE — Patient Instructions (Signed)
The main thing you can do for your leg swelling is to lose weight.  Aim for losing 2 lbs a week Cut back on fried foods  Compression stockings will help.  Put them on before you get out of bed  Elevate your legs when ever you are sitting  See Dr Clover Mealy as soon as you can - Call for an appointment TODAY  I will call you or send you a letter about your blood tests

## 2010-11-04 NOTE — Progress Notes (Signed)
  Subjective:    Patient ID: Rachel Hobbs, female    DOB: [birth year], 70 y.o.   MRN: [id number]  HPI  Edema Her legs have been swelling more over the last few weeks - months.  Better when first awake and then worsen during the day.  No skin breakdown.  No fever or shortness of breath or PND or chest pain No recent change in her medications.   She had her lasix increased by Dr Moshe Cipro a few months ago which helped at first but since has regressed.   She is supposed to see Dr Moshe Cipro but has not made an appt.  She did not take any medicines this AM  Review of Symptoms - see HPI  PMH - Smoking status noted.  She has CRF with last creatinine  Lab Results  Component Value Date   CREATININE 2.80* 09/25/2010      She has tried stockings in the past but not recently.       Review of Systems     Objective:   Physical Exam NAD Lungs:  Normal respiratory effort, chest expands symmetrically. Lungs are clear to auscultation, no crackles or wheezes. Heart - Regular rate and rhythm.  No murmurs, gallops or rubs.   Distant HS due to habitus Extremities - brawny dark firm swelling of the legs below the knees with small amount of pitting edema.  No skin breaks.  Mildly tender to palpation         Assessment & Plan:

## 2010-11-04 NOTE — Assessment & Plan Note (Signed)
Worsened.  Multifactorial - weight, CRF most likely causes.  Will check BMET and urged her to FU with Renal.   No signs of CHF or DVT or obstruction.   Will not change her medications as are mostly managed by Renal.  More lasix is not the answer.  May need to come of Amlopdipine but the most effective treatment would be weight loss

## 2010-11-05 ENCOUNTER — Telehealth: Payer: Self-pay | Admitting: Family Medicine

## 2010-11-05 NOTE — Telephone Encounter (Signed)
Spoke with her regarding labs.  Said renal function is a little worse and that she definitely needs to fu with her kidney doctor She agreed

## 2010-11-09 ENCOUNTER — Other Ambulatory Visit: Payer: Self-pay | Admitting: Family Medicine

## 2010-11-09 ENCOUNTER — Ambulatory Visit (HOSPITAL_COMMUNITY)
Admission: RE | Admit: 2010-11-09 | Discharge: 2010-11-09 | Disposition: A | Discharge status: Home / Self Care | Payer: Medicare Other | Source: Ambulatory Visit | Attending: Family Medicine | Admitting: Family Medicine

## 2010-11-09 DIAGNOSIS — E119 Type 2 diabetes mellitus without complications: Secondary | ICD-10-CM

## 2010-11-09 DIAGNOSIS — Z1231 Encounter for screening mammogram for malignant neoplasm of breast: Secondary | ICD-10-CM | POA: Insufficient documentation

## 2010-11-09 MED ORDER — GLUCOSE BLOOD VI STRP: ORAL_STRIP | Status: DC

## 2010-11-10 ENCOUNTER — Other Ambulatory Visit: Payer: Self-pay | Admitting: Family Medicine

## 2010-11-10 DIAGNOSIS — R928 Other abnormal and inconclusive findings on diagnostic imaging of breast: Secondary | ICD-10-CM

## 2010-11-10 MED ORDER — FUROSEMIDE 80 MG PO TABS: 80.0000 mg | ORAL_TABLET | Freq: Every day | ORAL | Status: DC

## 2010-11-10 NOTE — Telephone Encounter (Signed)
I don't see where furosemide was ever sent in, will forward to PCP.

## 2010-11-10 NOTE — Telephone Encounter (Signed)
Pt last seen by Dr. Erin Hearing- per his note "More lasix is not the answer."  Unsure if he meant to refill current dose or discontinue medication, will forward to him.  Either way, pt needs to see nephrology.

## 2010-11-10 NOTE — Telephone Encounter (Signed)
[facility] [address] did not receive the Rx for the fluid pill - Furosemide and test strip.  [facility] said they had not received those Rx.  Please call when this is complete.

## 2010-11-10 NOTE — Telephone Encounter (Signed)
Meant not to increase the dose.   If you refill it only give a small amount as she really needs to see renal Thanks [place]

## 2010-11-18 ENCOUNTER — Ambulatory Visit
Admission: RE | Admit: 2010-11-18 | Discharge: 2010-11-18 | Disposition: A | Discharge status: Home / Self Care | Payer: Medicare Other | Source: Ambulatory Visit | Attending: Family Medicine | Admitting: Family Medicine

## 2010-11-18 ENCOUNTER — Other Ambulatory Visit: Payer: Self-pay | Admitting: Family Medicine

## 2010-11-18 DIAGNOSIS — R928 Other abnormal and inconclusive findings on diagnostic imaging of breast: Secondary | ICD-10-CM

## 2010-11-24 ENCOUNTER — Ambulatory Visit
Admission: RE | Admit: 2010-11-24 | Discharge: 2010-11-24 | Disposition: A | Discharge status: Home / Self Care | Payer: Medicare Other | Source: Ambulatory Visit | Attending: Family Medicine | Admitting: Family Medicine

## 2010-12-24 ENCOUNTER — Encounter (HOSPITAL_COMMUNITY): Payer: Medicare Other | Attending: Nephrology

## 2010-12-24 DIAGNOSIS — D509 Iron deficiency anemia, unspecified: Secondary | ICD-10-CM | POA: Insufficient documentation

## 2010-12-30 ENCOUNTER — Other Ambulatory Visit: Payer: Self-pay | Admitting: Family Medicine

## 2010-12-30 MED ORDER — ATORVASTATIN CALCIUM 40 MG PO TABS: 40.0000 mg | ORAL_TABLET | Freq: Every day | ORAL | Status: DC

## 2010-12-31 ENCOUNTER — Encounter (HOSPITAL_COMMUNITY): Payer: Medicare Other

## 2011-01-18 LAB — GLUCOSE, CAPILLARY
Glucose-Capillary: 108 — ABNORMAL HIGH
Glucose-Capillary: 115 — ABNORMAL HIGH
Glucose-Capillary: 116 — ABNORMAL HIGH
Glucose-Capillary: 127 — ABNORMAL HIGH
Glucose-Capillary: 127 — ABNORMAL HIGH
Glucose-Capillary: 128 — ABNORMAL HIGH
Glucose-Capillary: 137 — ABNORMAL HIGH
Glucose-Capillary: 152 — ABNORMAL HIGH
Glucose-Capillary: 156 — ABNORMAL HIGH
Glucose-Capillary: 157 — ABNORMAL HIGH
Glucose-Capillary: 192 — ABNORMAL HIGH
Glucose-Capillary: 197 — ABNORMAL HIGH
Glucose-Capillary: 258 — ABNORMAL HIGH
Glucose-Capillary: 92
Glucose-Capillary: 94
Glucose-Capillary: 97

## 2011-01-18 LAB — COMPREHENSIVE METABOLIC PANEL
ALT: 15
ALT: 21
AST: 18
AST: 18
Albumin: 3.4 — ABNORMAL LOW
CO2: 28
Calcium: 8.1 — ABNORMAL LOW
Calcium: 9.3
Chloride: 100
Chloride: 102
Creatinine, Ser: 1.71 — ABNORMAL HIGH
GFR calc Af Amer: 24 — ABNORMAL LOW
GFR calc Af Amer: 36 — ABNORMAL LOW
GFR calc non Af Amer: 20 — ABNORMAL LOW
Sodium: 138
Sodium: 140
Total Bilirubin: 0.7

## 2011-01-18 LAB — DIFFERENTIAL
Basophils Relative: 0
Eosinophils Absolute: 0
Eosinophils Absolute: 0.1
Eosinophils Relative: 2
Lymphocytes Relative: 35
Lymphs Abs: 2
Monocytes Absolute: 0.6
Monocytes Absolute: 0.7
Monocytes Relative: 10
Monocytes Relative: 5

## 2011-01-18 LAB — BASIC METABOLIC PANEL
CO2: 24
CO2: 27
Calcium: 8 — ABNORMAL LOW
Chloride: 106
Chloride: 99
Creatinine, Ser: 1.55 — ABNORMAL HIGH
Creatinine, Ser: 1.8 — ABNORMAL HIGH
Creatinine, Ser: 2.35 — ABNORMAL HIGH
GFR calc Af Amer: 25 — ABNORMAL LOW
GFR calc Af Amer: 34 — ABNORMAL LOW
GFR calc Af Amer: 40 — ABNORMAL LOW
Glucose, Bld: 240 — ABNORMAL HIGH
Sodium: 139

## 2011-01-18 LAB — CBC
Hemoglobin: 12.3
MCHC: 32.8
MCHC: 33.2
MCV: 94
MCV: 95.9
Platelets: 270
Platelets: 300
RBC: 2.79 — ABNORMAL LOW
RBC: 3.47 — ABNORMAL LOW
RBC: 3.71 — ABNORMAL LOW
RBC: 3.9
WBC: 13 — ABNORMAL HIGH
WBC: 5.8
WBC: 6.9

## 2011-01-18 LAB — URINALYSIS, ROUTINE W REFLEX MICROSCOPIC
Glucose, UA: NEGATIVE
Leukocytes, UA: NEGATIVE
Protein, ur: >300 — AB
Specific Gravity, Urine: 1.019
pH: 5.5

## 2011-01-18 LAB — URINE MICROSCOPIC-ADD ON

## 2011-01-18 LAB — TYPE AND SCREEN: Antibody Screen: NEGATIVE

## 2011-01-22 ENCOUNTER — Telehealth: Payer: Self-pay | Admitting: Family Medicine

## 2011-01-22 NOTE — Telephone Encounter (Signed)
Rachel Hobbs has called to schedule an appt, but also, she says that [facility] on [address] has sent a request for her fluid pills, but they haven't heard back yet.  She would like to speak to someone about what she needs to do to get her medication.

## 2011-01-22 NOTE — Telephone Encounter (Signed)
Juliann Pulse,  Do you recall any refill request on this?  I am not sure how to find this out. Quantavia Frith, Salome Spotted

## 2011-01-24 NOTE — Telephone Encounter (Signed)
Her renal doctor should be refilling her Lasix and not [place]  (see notes from her last OV back in July).  Can you call her and tell her this?  We did get a faxed refill request but I shredded it.

## 2011-01-27 LAB — CBC
HCT: 23.4 — ABNORMAL LOW
HCT: 24.7 — ABNORMAL LOW
HCT: 25.2 — ABNORMAL LOW
HCT: 26.1 — ABNORMAL LOW
HCT: 27.9 — ABNORMAL LOW
HCT: 28.7 — ABNORMAL LOW
HCT: 29.8 — ABNORMAL LOW
HCT: 29.9 — ABNORMAL LOW
HCT: 32.5 — ABNORMAL LOW
Hemoglobin: 10.1 — ABNORMAL LOW
Hemoglobin: 11 — ABNORMAL LOW
Hemoglobin: 7.9 — CL
Hemoglobin: 8.3 — ABNORMAL LOW
Hemoglobin: 8.7 — ABNORMAL LOW
Hemoglobin: 8.8 — ABNORMAL LOW
Hemoglobin: 9.9 — ABNORMAL LOW
MCHC: 33.5
MCHC: 33.5
MCHC: 33.6
MCHC: 33.7
MCHC: 33.7
MCHC: 34
MCHC: 34.5
MCHC: 34.5
MCV: 88.3
MCV: 88.4
MCV: 88.7
MCV: 88.9
MCV: 89.2
MCV: 89.3
MCV: 89.4
MCV: 89.7
MCV: 90.1
MCV: 90.2
MCV: 90.7
MCV: 90.7
Platelets: 288
Platelets: 299
Platelets: 300
Platelets: 302
Platelets: 307
Platelets: 310
Platelets: 345
Platelets: 394
Platelets: 436 — ABNORMAL HIGH
Platelets: 533 — ABNORMAL HIGH
RBC: 2.62 — ABNORMAL LOW
RBC: 2.75 — ABNORMAL LOW
RBC: 2.79 — ABNORMAL LOW
RBC: 2.82 — ABNORMAL LOW
RBC: 3.15 — ABNORMAL LOW
RBC: 3.23 — ABNORMAL LOW
RBC: 3.24 — ABNORMAL LOW
RBC: 3.37 — ABNORMAL LOW
RBC: 3.65 — ABNORMAL LOW
RDW: 13.9
RDW: 14.2 — ABNORMAL HIGH
RDW: 14.6 — ABNORMAL HIGH
RDW: 14.6 — ABNORMAL HIGH
RDW: 14.7 — ABNORMAL HIGH
RDW: 14.9 — ABNORMAL HIGH
RDW: 15 — ABNORMAL HIGH
RDW: 15.3 — ABNORMAL HIGH
WBC: 10.6 — ABNORMAL HIGH
WBC: 11.6 — ABNORMAL HIGH
WBC: 3.5 — ABNORMAL LOW
WBC: 4.7
WBC: 5.4
WBC: 5.8
WBC: 7.6
WBC: 7.9
WBC: 9.1
WBC: 9.3

## 2011-01-27 LAB — CROSSMATCH
ABO/RH(D): O NEG
ABO/RH(D): O NEG
Antibody Screen: NEGATIVE
Antibody Screen: NEGATIVE

## 2011-01-27 LAB — COMPREHENSIVE METABOLIC PANEL
ALT: 75 — ABNORMAL HIGH
AST: 12
AST: 16
AST: 29
Albumin: 1.8 — ABNORMAL LOW
Albumin: 2 — ABNORMAL LOW
Albumin: 2.4 — ABNORMAL LOW
Albumin: 2.5 — ABNORMAL LOW
Alkaline Phosphatase: 47
BUN: 20
BUN: 24 — ABNORMAL HIGH
CO2: 22
Calcium: 7.2 — ABNORMAL LOW
Calcium: 7.7 — ABNORMAL LOW
Chloride: 110
Chloride: 114 — ABNORMAL HIGH
Chloride: 118 — ABNORMAL HIGH
Creatinine, Ser: 1.19
Creatinine, Ser: 1.31 — ABNORMAL HIGH
Creatinine, Ser: 1.89 — ABNORMAL HIGH
GFR calc Af Amer: 32 — ABNORMAL LOW
GFR calc Af Amer: 44 — ABNORMAL LOW
GFR calc Af Amer: 55 — ABNORMAL LOW
GFR calc non Af Amer: 37 — ABNORMAL LOW
GFR calc non Af Amer: 41 — ABNORMAL LOW
Glucose, Bld: 134 — ABNORMAL HIGH
Potassium: 3.5
Potassium: 3.8
Sodium: 134 — ABNORMAL LOW
Total Bilirubin: 0.4
Total Protein: 4.6 — ABNORMAL LOW
Total Protein: 5.4 — ABNORMAL LOW

## 2011-01-27 LAB — COMPREHENSIVE METABOLIC PANEL WITH GFR
ALT: 29
AST: 27
Albumin: 2 — ABNORMAL LOW
Alkaline Phosphatase: 138 — ABNORMAL HIGH
BUN: 23
CO2: 21
Calcium: 7.6 — ABNORMAL LOW
Chloride: 105
Creatinine, Ser: 1.28 — ABNORMAL HIGH
GFR calc Af Amer: 51 — ABNORMAL LOW
GFR calc non Af Amer: 42 — ABNORMAL LOW
Glucose, Bld: 162 — ABNORMAL HIGH
Potassium: 3.8
Sodium: 134 — ABNORMAL LOW
Total Bilirubin: 0.8
Total Protein: 5.3 — ABNORMAL LOW

## 2011-01-27 LAB — BASIC METABOLIC PANEL WITH GFR
BUN: 15
BUN: 22
BUN: 23
BUN: 24 — ABNORMAL HIGH
BUN: 31 — ABNORMAL HIGH
BUN: 55 — ABNORMAL HIGH
CO2: 21
CO2: 22
CO2: 23
CO2: 23
CO2: 24
CO2: 26
Calcium: 7.6 — ABNORMAL LOW
Calcium: 7.7 — ABNORMAL LOW
Calcium: 7.7 — ABNORMAL LOW
Calcium: 7.8 — ABNORMAL LOW
Calcium: 8 — ABNORMAL LOW
Calcium: 8.1 — ABNORMAL LOW
Chloride: 100
Chloride: 106
Chloride: 109
Chloride: 111
Chloride: 115 — ABNORMAL HIGH
Chloride: 119 — ABNORMAL HIGH
Creatinine, Ser: 1.13
Creatinine, Ser: 1.29 — ABNORMAL HIGH
Creatinine, Ser: 1.29 — ABNORMAL HIGH
Creatinine, Ser: 1.42 — ABNORMAL HIGH
Creatinine, Ser: 1.59 — ABNORMAL HIGH
Creatinine, Ser: 2.71 — ABNORMAL HIGH
GFR calc Af Amer: 21 — ABNORMAL LOW
GFR calc Af Amer: 39 — ABNORMAL LOW
GFR calc Af Amer: 45 — ABNORMAL LOW
GFR calc Af Amer: 50 — ABNORMAL LOW
GFR calc Af Amer: 50 — ABNORMAL LOW
GFR calc Af Amer: 58 — ABNORMAL LOW
GFR calc non Af Amer: 18 — ABNORMAL LOW
GFR calc non Af Amer: 33 — ABNORMAL LOW
GFR calc non Af Amer: 37 — ABNORMAL LOW
GFR calc non Af Amer: 41 — ABNORMAL LOW
GFR calc non Af Amer: 41 — ABNORMAL LOW
GFR calc non Af Amer: 48 — ABNORMAL LOW
Glucose, Bld: 100 — ABNORMAL HIGH
Glucose, Bld: 101 — ABNORMAL HIGH
Glucose, Bld: 113 — ABNORMAL HIGH
Glucose, Bld: 140 — ABNORMAL HIGH
Glucose, Bld: 209 — ABNORMAL HIGH
Glucose, Bld: 224 — ABNORMAL HIGH
Potassium: 3.5
Potassium: 3.5
Potassium: 3.7
Potassium: 3.9
Potassium: 4.3
Potassium: 4.4
Sodium: 133 — ABNORMAL LOW
Sodium: 135
Sodium: 135
Sodium: 140
Sodium: 144
Sodium: 147 — ABNORMAL HIGH

## 2011-01-27 LAB — WOUND CULTURE: Gram Stain: NONE SEEN

## 2011-01-27 LAB — DIFFERENTIAL
Basophils Absolute: 0
Basophils Relative: 0
Eosinophils Absolute: 0.1
Eosinophils Absolute: 0.2
Eosinophils Relative: 1
Eosinophils Relative: 2
Lymphocytes Relative: 19
Lymphs Abs: 1.5
Lymphs Abs: 1.7
Monocytes Absolute: 0.8 — ABNORMAL HIGH
Monocytes Absolute: 0.9 — ABNORMAL HIGH
Monocytes Relative: 10
Monocytes Relative: 8
Neutro Abs: 5.5
Neutrophils Relative %: 69

## 2011-01-27 LAB — VITAMIN B12: Vitamin B-12: 467 (ref 211–911)

## 2011-01-27 LAB — MAGNESIUM
Magnesium: 2
Magnesium: 2.1
Magnesium: 2.3

## 2011-01-27 LAB — IRON AND TIBC
Iron: 17 — ABNORMAL LOW
Saturation Ratios: 11 — ABNORMAL LOW
TIBC: 150 — ABNORMAL LOW
UIBC: 133

## 2011-01-27 LAB — PHOSPHORUS
Phosphorus: 1.2 — ABNORMAL LOW
Phosphorus: 2.5
Phosphorus: 2.9
Phosphorus: 2.9

## 2011-01-27 LAB — TRIGLYCERIDES: Triglycerides: 168 — ABNORMAL HIGH

## 2011-01-27 LAB — BASIC METABOLIC PANEL
Chloride: 107
GFR calc Af Amer: 44 — ABNORMAL LOW
Potassium: 4.7

## 2011-01-27 LAB — FERRITIN: Ferritin: 99 (ref 10–291)

## 2011-01-27 LAB — CHOLESTEROL, TOTAL: Cholesterol: 139

## 2011-01-27 LAB — RETICULOCYTES
RBC.: 2.79 — ABNORMAL LOW
Retic Count, Absolute: 50.2
Retic Ct Pct: 1.8

## 2011-01-27 LAB — ABO/RH: ABO/RH(D): O NEG

## 2011-01-27 LAB — FOLATE: Folate: 7.7

## 2011-01-28 LAB — CBC
Hemoglobin: 11.4 — ABNORMAL LOW
MCHC: 34.2
MCHC: 34.5
MCV: 88.8
Platelets: 365
RBC: 3.73 — ABNORMAL LOW
RDW: 14.6 — ABNORMAL HIGH
WBC: 8.2

## 2011-01-28 LAB — COMPREHENSIVE METABOLIC PANEL
ALT: 13
ALT: 16
AST: 19
AST: 19
CO2: 23
Calcium: 9.2
Calcium: 9.6
Chloride: 105
GFR calc Af Amer: 19 — ABNORMAL LOW
GFR calc Af Amer: 22 — ABNORMAL LOW
GFR calc non Af Amer: 18 — ABNORMAL LOW
Glucose, Bld: 184 — ABNORMAL HIGH
Sodium: 139
Sodium: 141
Total Bilirubin: 0.8
Total Protein: 6.9

## 2011-01-28 LAB — URINALYSIS, ROUTINE W REFLEX MICROSCOPIC
Glucose, UA: NEGATIVE
Leukocytes, UA: NEGATIVE
Specific Gravity, Urine: 1.028
pH: 5

## 2011-01-28 LAB — DIFFERENTIAL
Basophils Absolute: 0.1
Basophils Relative: 1
Eosinophils Relative: 1
Monocytes Absolute: 0.5
Neutro Abs: 6.3

## 2011-02-03 LAB — POCT CARDIAC MARKERS
CKMB, poc: 1.9
Myoglobin, poc: 147
Troponin i, poc: <0.05

## 2011-02-03 LAB — COMPREHENSIVE METABOLIC PANEL
ALT: 13
AST: 23
Alkaline Phosphatase: 68
CO2: 28
Chloride: 104
Creatinine, Ser: 1.61 — ABNORMAL HIGH
GFR calc Af Amer: 39 — ABNORMAL LOW
GFR calc non Af Amer: 32 — ABNORMAL LOW
Potassium: 4.3
Total Bilirubin: 1.2

## 2011-02-03 LAB — URINALYSIS, ROUTINE W REFLEX MICROSCOPIC
Bilirubin Urine: NEGATIVE
Glucose, UA: NEGATIVE
Ketones, ur: NEGATIVE
pH: 6

## 2011-02-03 LAB — CBC
HCT: 36.2
MCV: 87.3
RBC: 4.14
WBC: 8.1

## 2011-02-03 LAB — URINE MICROSCOPIC-ADD ON

## 2011-02-03 LAB — DIFFERENTIAL
Eosinophils Relative: 1
Lymphocytes Relative: 17
Monocytes Absolute: 0.4

## 2011-02-15 ENCOUNTER — Ambulatory Visit (INDEPENDENT_AMBULATORY_CARE_PROVIDER_SITE_OTHER): Payer: Medicare Other | Admitting: Family Medicine

## 2011-02-15 ENCOUNTER — Encounter: Payer: Self-pay | Admitting: Family Medicine

## 2011-02-15 VITALS — BP 146/77 | HR 80 | Temp 98.0°F | Ht 63.0 in | Wt 257.4 lb

## 2011-02-15 DIAGNOSIS — I10 Essential (primary) hypertension: Secondary | ICD-10-CM

## 2011-02-15 DIAGNOSIS — N19 Unspecified kidney failure: Secondary | ICD-10-CM

## 2011-02-15 DIAGNOSIS — E119 Type 2 diabetes mellitus without complications: Secondary | ICD-10-CM

## 2011-02-15 DIAGNOSIS — R609 Edema, unspecified: Secondary | ICD-10-CM

## 2011-02-15 LAB — POCT GLYCOSYLATED HEMOGLOBIN (HGB A1C): Hemoglobin A1C: 8.4

## 2011-02-15 MED ORDER — CARVEDILOL 12.5 MG PO TABS: 12.5000 mg | ORAL_TABLET | Freq: Two times a day (BID) | ORAL | Status: DC

## 2011-02-15 NOTE — Patient Instructions (Signed)
It was good to see you today.  Your Hemoglobin A1C is  Lab Results  Component Value Date   HGBA1C 8.4 02/15/2011  .  Remember your goal for A1C is less than 7.  Your goal for fasting morning blood sugar is 80-120.   I want you to try to eat three meals a day.   When you eat three meals a day, take 45 units of Lantus at bedtime.  If you do not think you have eaten very well, take 35-40 units of lantus at bedtime. If you have not eat at all, take 25 units of lantus at bedtime.  Your blood pressure today was BP: 146/77 mmHg.  Remember your goal blood pressure is about 120/80.  Please be sure to take your medication every day.   I have increased your Coreg (carvedilol) to 12.5 mg by mouth twice a day for your blood pressure.  Try wearing some TED hose on your legs for the swelling.  Remember what we talked about- Lots of veggies, and try to walk more for exercise.    Please keep your appointment with your nephrologist, and come back and see me in about 3  Months.

## 2011-02-19 NOTE — Assessment & Plan Note (Signed)
Cr worsened by last labs, now Stage III.  Patient is to see nephrology soon.  Will avoid nephrotoxic drugs.

## 2011-02-19 NOTE — Progress Notes (Signed)
  Subjective:    Patient ID: Rachel Hobbs, female    DOB: May 25, 1940, 70 y.o.   MRN: [id number]  HPI  Rachel Hobbs comes in for follow up of her chronic problems.   DM- she reports she is taking her medications but sometimes she does not eat much.  She says when she does not eat anything, she takes 20 units of Lantus ,when she only eats a little during the day, she takes 30 units.  When she eats regularly, she takes 40 units.  She reports some elevated fasting blood sugars.  She denies hypoglycemic episodes, polyuria, polydipsia.m    HTN- patient is taking her blood pressure medications without side effects.  Denies chest pain, palpitations, dyspnea, headaches or vision changes.   Patient continues to complain of her lower extremity edema.  She says Lasix used to help but does not anymore.  She says it is worse after she stands for a long time. She is not wearing any compression hose.   Patient understands her kidney function has been up.  She understands my concerns about her being on high doses of lasix.  She is seeing her nephrologist next month and agrees to discuss the lasix with them.   Review of Systems Noted in HPI.     Objective:   Physical Exam  Vitals reviewed. Constitutional: She is oriented to person, place, and time. She appears well-developed and well-nourished.  HENT:  Head: Normocephalic.  Eyes: Pupils are equal, round, and reactive to light.  Neck: Neck supple. No JVD present. No thyromegaly present.  Cardiovascular: Normal rate, regular rhythm and normal heart sounds.   Pulmonary/Chest: Effort normal and breath sounds normal.  Abdominal: Soft. Bowel sounds are normal.  Musculoskeletal: She exhibits no edema.  Neurological: She is alert and oriented to person, place, and time.  Skin: Skin is warm and dry.          Assessment & Plan:

## 2011-02-19 NOTE — Assessment & Plan Note (Signed)
Poorly controlled.  Will increase coreg.  Patient to follow up for nurse visit for BP check.

## 2011-02-19 NOTE — Assessment & Plan Note (Signed)
Advised trying compression hose, discussed dangers with Lasix.  Will consider transitioning off Norvasc when BP is better controlled.

## 2011-02-19 NOTE — Assessment & Plan Note (Signed)
Continues to be poorly controlled.  Will increase Lantus, see patient instructions.  Discussed importance of balanced eating.

## 2011-03-16 ENCOUNTER — Telehealth: Payer: Self-pay | Admitting: Family Medicine

## 2011-03-16 NOTE — Telephone Encounter (Signed)
Will forward message to Dr. Valentina Lucks .  Advised patient that I will call her back when Dr. Valentina Lucks has responded.

## 2011-03-16 NOTE — Telephone Encounter (Signed)
Pt wants to know if lipitor has been recalled? Heard something on Tv

## 2011-03-19 ENCOUNTER — Other Ambulatory Visit: Payer: Self-pay | Admitting: Family Medicine

## 2011-03-19 DIAGNOSIS — I10 Essential (primary) hypertension: Secondary | ICD-10-CM

## 2011-03-19 MED ORDER — QUINAPRIL HCL 40 MG PO TABS
40.0000 mg | ORAL_TABLET | Freq: Two times a day (BID) | ORAL | Status: DC
Start: 2011-03-19 — End: 2011-03-30

## 2011-03-19 NOTE — Telephone Encounter (Signed)
Consulted with Dr. Valentina Lucks and he states that the recall was for a specific brand of generic lipitor. Marland Kitchen  Pharmacy would have contacted her if the brand she is on was involved in recall. I left this message on voicemail .

## 2011-03-21 ENCOUNTER — Other Ambulatory Visit: Payer: Self-pay | Admitting: Family Medicine

## 2011-03-21 NOTE — Telephone Encounter (Signed)
Refill request

## 2011-03-22 ENCOUNTER — Telehealth: Payer: Self-pay | Admitting: Family Medicine

## 2011-03-22 NOTE — Telephone Encounter (Signed)
Took last dose of insulin last night and pharmacy states that they haven't heard from [place] yet.  Needs asap

## 2011-03-22 NOTE — Telephone Encounter (Signed)
Refill completed.

## 2011-03-30 ENCOUNTER — Other Ambulatory Visit: Payer: Self-pay | Admitting: Family Medicine

## 2011-03-30 DIAGNOSIS — I10 Essential (primary) hypertension: Secondary | ICD-10-CM

## 2011-03-30 MED ORDER — QUINAPRIL HCL 40 MG PO TABS: 40.0000 mg | ORAL_TABLET | Freq: Two times a day (BID) | ORAL | Status: DC

## 2011-05-11 ENCOUNTER — Encounter: Payer: Self-pay | Admitting: Family Medicine

## 2011-05-11 ENCOUNTER — Ambulatory Visit (INDEPENDENT_AMBULATORY_CARE_PROVIDER_SITE_OTHER): Payer: Medicare Other | Admitting: Family Medicine

## 2011-05-11 DIAGNOSIS — R0602 Shortness of breath: Secondary | ICD-10-CM | POA: Insufficient documentation

## 2011-05-11 DIAGNOSIS — I871 Compression of vein: Secondary | ICD-10-CM

## 2011-05-11 DIAGNOSIS — N19 Unspecified kidney failure: Secondary | ICD-10-CM

## 2011-05-11 DIAGNOSIS — R0609 Other forms of dyspnea: Secondary | ICD-10-CM

## 2011-05-11 DIAGNOSIS — R0600 Dyspnea, unspecified: Secondary | ICD-10-CM | POA: Insufficient documentation

## 2011-05-11 NOTE — Patient Instructions (Signed)
You need to call you kidney doctor tomorrow and be seen this week- I am concerned you are continuing to gain fluid and your kidneys are not able to do their job  Elevated legs when sitting  Use compression stockings to keep fluid off  Go to ER if worsening trouble breathing or unable to catch breath after resting

## 2011-05-11 NOTE — Assessment & Plan Note (Signed)
Gave rx for compression stockings.  Advised to elevate legs, watch sodium.

## 2011-05-11 NOTE — Progress Notes (Signed)
  Subjective:    Patient ID: Rachel Hobbs, female    DOB: March 06, 1941, 71 y.o.   MRN: [id number]  HPI  Here for work in appt to discuss lower extremity edema. Hx of venous stasis, CKD- approaching dialysis.  Chronic, worsening in past several weeks.  Gets SOB when walking in [employer], otherwise comfortable at rest.    No redness, or pain in legs.  No chest pain, PND.  Notes 25 pund weight gain in past 3 months.  Not wearing compression stockings.  She feels she follows a low salt diet  Diuretics managed by renal.  Is on Lasix 120 mg BID.  Also on amlodipine, carvedilol, and quinapril for blood pressure.    Review of Systems See hpi    Objective:   Physical Exam GEN: Alert & Oriented, No acute distress.  CV:  Regular Rate & Rhythm, no murmur Respiratory:  Normal work of breathing at rest,  Gets winded when walking- pulse ox normal immediately upon sitting down. CTAB, no crackles at bases Ext: marked 2+ LE edema in lower legs, no pain or erythema.  Chronic venous stasis skin thickening and hyperpigmentation noted.       Assessment & Plan:

## 2011-05-11 NOTE — Assessment & Plan Note (Signed)
Likely multifactorial, related to increased fluid, weight gain, deconditioning.  No evidence of pulmonary edema today.  Already on high doses of lasix managed by nephrologist.  Will tx LE edema with compression hose, asked her to follow-up with kidney doctor for appt.  Given red flags for signs of pulmonary edema for seeking urgent medical care

## 2011-05-11 NOTE — Assessment & Plan Note (Signed)
Likely worsening given large amount of weight gain and worsening LE edema.  Advised quick follow-up with nephrology, see plan for edema.

## 2011-06-01 ENCOUNTER — Encounter: Payer: Self-pay | Admitting: *Deleted

## 2011-06-03 ENCOUNTER — Telehealth: Payer: Self-pay | Admitting: *Deleted

## 2011-06-03 NOTE — Telephone Encounter (Signed)
Patient calling office at 4:15pm requesting appt for chills, congestion, and "ears stopped up."  No appts available and patient informed she can be evaluated at urgent care.  Patient verbalized understanding.  Nolene Ebbs, RN

## 2011-06-11 ENCOUNTER — Encounter: Payer: Self-pay | Admitting: Surgery

## 2011-06-14 ENCOUNTER — Ambulatory Visit (INDEPENDENT_AMBULATORY_CARE_PROVIDER_SITE_OTHER): Payer: Medicare Other | Admitting: Surgery

## 2011-06-14 ENCOUNTER — Encounter (INDEPENDENT_AMBULATORY_CARE_PROVIDER_SITE_OTHER): Payer: Medicare Other | Admitting: *Deleted

## 2011-06-14 ENCOUNTER — Encounter: Payer: Self-pay | Admitting: Surgery

## 2011-06-14 VITALS — BP 173/62 | HR 71 | Resp 16 | Ht 62.0 in | Wt 275.0 lb

## 2011-06-14 DIAGNOSIS — N184 Chronic kidney disease, stage 4 (severe): Secondary | ICD-10-CM

## 2011-06-14 DIAGNOSIS — Z01810 Encounter for preprocedural cardiovascular examination: Secondary | ICD-10-CM

## 2011-06-14 DIAGNOSIS — N186 End stage renal disease: Secondary | ICD-10-CM | POA: Insufficient documentation

## 2011-06-14 NOTE — Progress Notes (Signed)
Today became A. information discussion regarding dialysis. The patient was referred for access planning. She refused a ultrasound today because she wanted to speak with me prior to getting any testing. I spent an excess of 30 minutes discussing with the patient the current clinical situation she is in. We talked about the pain being need for dialysis and the indications and reasons for proceeding with access placement prior to requiring dialysis. I believe I entered the majority of her questions today. Unfortunately we do not have the ability to obtain the ultrasound at this time since she canceled her initial appointment. She is scheduled to see Dr.Goldsborough within the next month. I will plan on seeing her after that visit we will get an ultrasound at that time and discuss access management

## 2011-06-25 ENCOUNTER — Encounter (HOSPITAL_COMMUNITY): Payer: Self-pay | Admitting: *Deleted

## 2011-06-25 ENCOUNTER — Inpatient Hospital Stay (HOSPITAL_COMMUNITY)
Admission: EM | Admit: 2011-06-25 | Discharge: 2011-06-28 | DRG: 683 | Disposition: A | Discharge status: Home / Self Care | Payer: Medicare Other | Attending: Family Medicine | Admitting: Family Medicine

## 2011-06-25 ENCOUNTER — Other Ambulatory Visit: Payer: Self-pay

## 2011-06-25 ENCOUNTER — Emergency Department (HOSPITAL_COMMUNITY): Payer: Medicare Other

## 2011-06-25 DIAGNOSIS — Z8249 Family history of ischemic heart disease and other diseases of the circulatory system: Secondary | ICD-10-CM

## 2011-06-25 DIAGNOSIS — E118 Type 2 diabetes mellitus with unspecified complications: Secondary | ICD-10-CM | POA: Diagnosis present

## 2011-06-25 DIAGNOSIS — N189 Chronic kidney disease, unspecified: Secondary | ICD-10-CM | POA: Diagnosis present

## 2011-06-25 DIAGNOSIS — I10 Essential (primary) hypertension: Secondary | ICD-10-CM | POA: Diagnosis present

## 2011-06-25 DIAGNOSIS — E1142 Type 2 diabetes mellitus with diabetic polyneuropathy: Secondary | ICD-10-CM | POA: Diagnosis present

## 2011-06-25 DIAGNOSIS — R609 Edema, unspecified: Secondary | ICD-10-CM | POA: Diagnosis present

## 2011-06-25 DIAGNOSIS — R0600 Dyspnea, unspecified: Secondary | ICD-10-CM

## 2011-06-25 DIAGNOSIS — I129 Hypertensive chronic kidney disease with stage 1 through stage 4 chronic kidney disease, or unspecified chronic kidney disease: Secondary | ICD-10-CM | POA: Diagnosis present

## 2011-06-25 DIAGNOSIS — N179 Acute kidney failure, unspecified: Principal | ICD-10-CM | POA: Diagnosis present

## 2011-06-25 DIAGNOSIS — N184 Chronic kidney disease, stage 4 (severe): Secondary | ICD-10-CM | POA: Diagnosis present

## 2011-06-25 DIAGNOSIS — K458 Other specified abdominal hernia without obstruction or gangrene: Secondary | ICD-10-CM | POA: Diagnosis present

## 2011-06-25 DIAGNOSIS — D631 Anemia in chronic kidney disease: Secondary | ICD-10-CM | POA: Diagnosis present

## 2011-06-25 DIAGNOSIS — N19 Unspecified kidney failure: Secondary | ICD-10-CM | POA: Diagnosis present

## 2011-06-25 DIAGNOSIS — E669 Obesity, unspecified: Secondary | ICD-10-CM | POA: Diagnosis present

## 2011-06-25 DIAGNOSIS — D649 Anemia, unspecified: Secondary | ICD-10-CM | POA: Diagnosis present

## 2011-06-25 DIAGNOSIS — Z7982 Long term (current) use of aspirin: Secondary | ICD-10-CM

## 2011-06-25 DIAGNOSIS — I871 Compression of vein: Secondary | ICD-10-CM | POA: Insufficient documentation

## 2011-06-25 DIAGNOSIS — N186 End stage renal disease: Secondary | ICD-10-CM | POA: Diagnosis present

## 2011-06-25 DIAGNOSIS — E785 Hyperlipidemia, unspecified: Secondary | ICD-10-CM | POA: Diagnosis present

## 2011-06-25 DIAGNOSIS — E875 Hyperkalemia: Secondary | ICD-10-CM | POA: Diagnosis present

## 2011-06-25 DIAGNOSIS — I5030 Unspecified diastolic (congestive) heart failure: Secondary | ICD-10-CM | POA: Diagnosis present

## 2011-06-25 DIAGNOSIS — K439 Ventral hernia without obstruction or gangrene: Secondary | ICD-10-CM | POA: Diagnosis present

## 2011-06-25 DIAGNOSIS — D638 Anemia in other chronic diseases classified elsewhere: Secondary | ICD-10-CM | POA: Diagnosis present

## 2011-06-25 DIAGNOSIS — E119 Type 2 diabetes mellitus without complications: Secondary | ICD-10-CM

## 2011-06-25 DIAGNOSIS — E1149 Type 2 diabetes mellitus with other diabetic neurological complication: Secondary | ICD-10-CM | POA: Diagnosis present

## 2011-06-25 DIAGNOSIS — Z6841 Body Mass Index (BMI) 40.0 and over, adult: Secondary | ICD-10-CM

## 2011-06-25 LAB — DIFFERENTIAL
Basophils Absolute: 0 10*3/uL (ref 0.0–0.1)
Eosinophils Relative: 1 % (ref 0–5)
Lymphocytes Relative: 13 % (ref 12–46)
Neutro Abs: 6.7 10*3/uL (ref 1.7–7.7)
Neutrophils Relative %: 80 % — ABNORMAL HIGH (ref 43–77)

## 2011-06-25 LAB — CBC
Platelets: 272 10*3/uL (ref 150–400)
RDW: 13.4 % (ref 11.5–15.5)
WBC: 8.3 10*3/uL (ref 4.0–10.5)

## 2011-06-25 LAB — BASIC METABOLIC PANEL
CO2: 23 mEq/L (ref 19–32)
Calcium: 8.6 mg/dL (ref 8.4–10.5)
Chloride: 103 mEq/L (ref 96–112)
Sodium: 137 mEq/L (ref 135–145)

## 2011-06-25 LAB — URINE MICROSCOPIC-ADD ON

## 2011-06-25 LAB — PRO B NATRIURETIC PEPTIDE: Pro B Natriuretic peptide (BNP): 731.5 pg/mL — ABNORMAL HIGH (ref 0–125)

## 2011-06-25 LAB — URINALYSIS, ROUTINE W REFLEX MICROSCOPIC
Glucose, UA: 100 mg/dL — AB
Leukocytes, UA: NEGATIVE
Protein, ur: >300 mg/dL — AB

## 2011-06-25 LAB — GLUCOSE, CAPILLARY: Glucose-Capillary: 170 mg/dL — ABNORMAL HIGH (ref 70–99)

## 2011-06-25 MED ORDER — HYDRALAZINE HCL 50 MG PO TABS
50.0000 mg | ORAL_TABLET | Freq: Two times a day (BID) | ORAL | Status: DC
  Administered 2011-06-26 – 2011-06-28 (×5): 50 mg via ORAL
  Filled 2011-06-25 (×6): qty 1

## 2011-06-25 MED ORDER — SODIUM CHLORIDE 0.9 % IV SOLN
Freq: Once | INTRAVENOUS | Status: AC
  Administered 2011-06-25: 08:00:00 via INTRAVENOUS

## 2011-06-25 MED ORDER — LORATADINE 10 MG PO TABS
10.0000 mg | ORAL_TABLET | Freq: Every day | ORAL | Status: DC
  Administered 2011-06-26 – 2011-06-28 (×3): 10 mg via ORAL
  Filled 2011-06-25 (×3): qty 1

## 2011-06-25 MED ORDER — ACETAMINOPHEN 650 MG RE SUPP: 650.0000 mg | Freq: Four times a day (QID) | RECTAL | Status: DC | PRN

## 2011-06-25 MED ORDER — CARVEDILOL 12.5 MG PO TABS
12.5000 mg | ORAL_TABLET | Freq: Two times a day (BID) | ORAL | Status: DC
  Administered 2011-06-26 – 2011-06-28 (×5): 12.5 mg via ORAL
  Filled 2011-06-25 (×8): qty 1

## 2011-06-25 MED ORDER — DOCUSATE SODIUM 100 MG PO CAPS
100.0000 mg | ORAL_CAPSULE | Freq: Two times a day (BID) | ORAL | Status: DC
  Administered 2011-06-26 – 2011-06-28 (×5): 100 mg via ORAL
  Filled 2011-06-25 (×6): qty 1

## 2011-06-25 MED ORDER — ATORVASTATIN CALCIUM 40 MG PO TABS
40.0000 mg | ORAL_TABLET | Freq: Every day | ORAL | Status: DC
  Administered 2011-06-26 – 2011-06-27 (×2): 40 mg via ORAL
  Filled 2011-06-25 (×3): qty 1

## 2011-06-25 MED ORDER — ASPIRIN 81 MG PO CHEW
81.0000 mg | CHEWABLE_TABLET | Freq: Every day | ORAL | Status: DC
  Administered 2011-06-26 – 2011-06-28 (×3): 81 mg via ORAL
  Filled 2011-06-25 (×3): qty 1

## 2011-06-25 MED ORDER — ACETAMINOPHEN 325 MG PO TABS
650.0000 mg | ORAL_TABLET | Freq: Four times a day (QID) | ORAL | Status: DC | PRN
  Administered 2011-06-26 – 2011-06-27 (×3): 650 mg via ORAL
  Filled 2011-06-25 (×3): qty 2

## 2011-06-25 MED ORDER — SODIUM CHLORIDE 0.9 % IJ SOLN
3.0000 mL | Freq: Two times a day (BID) | INTRAMUSCULAR | Status: DC
  Administered 2011-06-26 – 2011-06-28 (×6): 3 mL via INTRAVENOUS

## 2011-06-25 MED ORDER — ALBUTEROL SULFATE (5 MG/ML) 0.5% IN NEBU: 2.5000 mg | INHALATION_SOLUTION | RESPIRATORY_TRACT | Status: AC | PRN

## 2011-06-25 MED ORDER — KETOTIFEN FUMARATE 0.025 % OP SOLN: 1.0000 [drp] | Freq: Two times a day (BID) | OPHTHALMIC | Status: DC

## 2011-06-25 MED ORDER — INSULIN GLARGINE 100 UNIT/ML ~~LOC~~ SOLN
40.0000 [IU] | Freq: Every day | SUBCUTANEOUS | Status: DC
  Administered 2011-06-26 – 2011-06-27 (×2): 40 [IU] via SUBCUTANEOUS
  Filled 2011-06-25: qty 3

## 2011-06-25 MED ORDER — ENOXAPARIN SODIUM 30 MG/0.3ML ~~LOC~~ SOLN
30.0000 mg | SUBCUTANEOUS | Status: DC
  Administered 2011-06-26 – 2011-06-28 (×3): 30 mg via SUBCUTANEOUS
  Filled 2011-06-25 (×3): qty 0.3

## 2011-06-25 MED ORDER — FUROSEMIDE 10 MG/ML IJ SOLN
40.0000 mg | Freq: Once | INTRAMUSCULAR | Status: AC
  Administered 2011-06-26: 40 mg via INTRAVENOUS
  Filled 2011-06-25 (×2): qty 4

## 2011-06-25 MED ORDER — ASPIRIN 81 MG PO TABS: 81.0000 mg | ORAL_TABLET | Freq: Every day | ORAL | Status: DC

## 2011-06-25 MED ORDER — FUROSEMIDE 10 MG/ML IJ SOLN
40.0000 mg | Freq: Once | INTRAMUSCULAR | Status: AC
  Administered 2011-06-25: 40 mg via INTRAVENOUS
  Filled 2011-06-25: qty 4

## 2011-06-25 MED ORDER — INSULIN ASPART 100 UNIT/ML ~~LOC~~ SOLN
0.0000 [IU] | Freq: Three times a day (TID) | SUBCUTANEOUS | Status: DC
  Administered 2011-06-26 (×3): 3 [IU] via SUBCUTANEOUS
  Administered 2011-06-27 (×2): 2 [IU] via SUBCUTANEOUS
  Filled 2011-06-25 (×2): qty 3

## 2011-06-25 MED ORDER — IPRATROPIUM BROMIDE 0.02 % IN SOLN: 0.5000 mg | RESPIRATORY_TRACT | Status: AC | PRN

## 2011-06-25 NOTE — ED Notes (Signed)
Patient transported to X-ray 

## 2011-06-25 NOTE — Discharge Instructions (Signed)
Please follow up with Dr. Barbra Sarks at [facility] on Monday at 9:15am.  Return to ER if your symptoms worsen or if you have other concerns.    Getting Ready For Dialysis When kidneys do not work well it is called kidney failure. This is sometimes called Chronic Kidney Disease (CKD). Kidney failure may happen gradually or suddenly. Kidneys stop working well for many different reasons. The most common causes are:  Diabetes.   High blood pressure.   Kidney infection.   Cysts in the kidneys.  TREATMENT OF KIDNEY FAILURE  Kidney transplant.   Hemodialysis.   Peritoneal dialysis.  KIDNEY TRANSPLANT: A kidney from another person is put into your body. This kidney takes the place of your kidney.  Kidneys can be transplanted from:   A family member.   A friend.   A "brain-dead" person.   There are not enough kidneys for all the people who need them. Only a few people with kidney failure will get transplants.   To find out about kidney transplant, ask your doctor. Your doctor will tell you how to apply to get a kidney.  DIALYSIS: There are 2 types of dialysis:  Hemodialysis.   Peritoneal dialysis.  You and your doctor will talk about which is best for you. Before you can start dialysis, you need surgery to make the "access" (a place on your body through which dialysis can be done). The access is meant to be permanent. About Hemodialysis: There are 2 ways to access your blood.  The best type of access is an arteriovenous fistula. This makes a good connection place for dialysis. Let this new access heal for 4-6 weeks.   If veins are small or have scars and cannot be used to make a fistula, then an arteriovenous graft can be made. It should be allowed to heal for 2-4 weeks.  Access allows your blood to flow through special tubes to a machine. There your blood is cleaned by a filter. You do not feel the blood moving. Only 1 cup of blood is out of your body at one time.    PERITONEAL DIALYSIS ACCESS: How peritoneal dialysis works: The peritoneal access is made by putting a tube into your belly. Peritoneal dialysis works inside Veterinary surgeon. It gets rid of waste, poisons and excess water. Your body's own membranes in the belly are used. This body part is like a thin, soft sheet which covers the organs inside your belly. This part works as the filter. During access surgery, the catheter is put directly through the wall of your abdomen.  BEFORE YOU HAVE ANY TYPE OF ACCESS SURGERY Things you can do to help yourself before surgery:  Be as healthy as you can before surgery. Stay healthy after surgery. Talk to your doctor about ways to:   Lose weight if you are too heavy.   Stop smoking.   Exercise and keep active.   Lessen stress.   Take your high blood pressure or diabetes medicine. Keep your blood pressure or blood sugar at a healthy level.   Find out all you can about kidney disease and the treatment and surgery for it. Understand what your lab tests mean. Ask questions until you understand.   If you will be getting a fistula or graft in your arm, exercise your arm muscles by squeezing and letting go of a rubber ball in your hand. Do this 4 times a day (breakfast, lunch, dinner, bedtime). Each time, squeeze the ball 125 times.  Most often the surgery is done in the Day Surgery Unit at the hospital.   For fistula or graft surgery, you will be given a shot in your under-arm so that you will have no feeling in the arm for awhile. You will also be given medicine to help you relax and maybe even sleep.   For all access surgeries, do not eat or drink for at least 8 hours before your surgery.   For peritoneal access surgery, it will be important to clean out your bowels very well. Ask your doctor which laxative to use.  TAKING CARE OF YOUR HEMODIALYSIS ACCESS AFTER SURGERY:  For 2 days keep your arm raised on pillows. Keep your arm above your heart when resting or  sleeping. Do not put a heating pad on your arm.   You may use an ice pack for the first 6 - 12 hours after surgery. A bag of frozen peas wrapped in a thin cloth makes a good cold pack. Put it outside the area of the gauze bandage. Put it on the sides and back of your arm. Never put the ice pack directly on top of your fistula or graft.   Every day, look closely at the surgery area. Watch for signs of infection. Some mild swelling and discomfort is normal for about 2 weeks.   You may also have some bruises around your fistula or graft. This is normal. A tiny bit of bleeding is also normal.   At least twice a day you should check your fistula or graft to see if it is "working". Do this with three fingertips laid lightly above the surgery area. If you have a fistula, you should feel a "buzz" or "thrill". This means blood is flowing well. If you have a graft, you should feel a strong pulse. If you do not feel anything, call your kidney doctor right away.   If you hurt, take the pain medicine your doctor prescribed. It is best to take pain medicine for only 1 or 2 days after surgery.   Usually, there are no stitches that need to be taken out. The tape strips may stay on for 7 - 10 days and fall off by themselves. A loose bandage may be put over your arm. This will cover the place where the surgery was done. Do not wrap or tape tight bandages all the way around your arm.   To keep the blood in your fistula or graft from clotting:   Do not sleep on your arm.   Do not keep your elbow bent for long periods of time. Never use a sling.   Do not wear tight sleeves or anything binding on your arm.   Do not let anyone except dialysis doctors and nurses stick needles into your fistula or graft.   Do not let the arm with the fistula or graft be used to check your blood pressure.   Do not carry heavy things with the fistula or graft arm.   Ask your doctor when you can get your arm wet. Dry your fistula or  graft by patting it gently with a clean towel. Do not rub it dry.   After 5 days, begin mild exercise of your arm. Squeeze and let go of a rubber ball (or a rolled-up sock) in your hand. Do this 4 times a day. Each time, squeeze 125 times.   Every time you go to the center for dialysis wash your access arm well. Wash it for  2 minutes with soap and water before your treatment. Pat it dry gently.   Remind your dialysis nurse to stick your access in a different place each time.   If you have any questions or problems, call the Vascular Access Nurse. Be sure you have check-up appointments at 1 - 2 weeks and 6 weeks after surgery.  CALL YOUR DOCTOR RIGHT AWAY IF:  You have no feeling, tingling or pain in your hand.   You are not able to move your hand.   Your arm is red, swollen or has pain.   Your surgery area keeps bleeding.   You have drainage of any kind from your wound.   You have a fever over 100 F (37.7 C).  POSSIBLE PROBLEMS WITH YOUR HEMODIALYSIS ACCESS Sometimes an access may have a problem such as:  Clotting.   Weak, balloon-like places on your fistula.   Not working well.  If a second surgery is needed to remove blood clots or to fix other problems, your access can still be used for dialysis soon afterward. If not your doctor will let you know.  If you have already had a second, corrective surgery, re-read Taking Care of Your Hemodialysis Access After Surgery above. TAKING CARE OF YOUR PERITONEAL DIALYSIS ACCESS AFTER SURGERY:  After surgery you will have some pain. This is normal. Take the pain medicine that your doctor prescribed for you.   It is best to take pain medicine for only 1 or 2 days after surgery. If you need to take it longer than this, ask your doctor about a mild laxative. Pain medicine can cause constipation.   For the next 2 weeks, do not lift anything that weighs more than 5 pounds. A half-gallon of milk weighs about 4 pounds. Do not strain or tighten  the muscles of your belly.   There are no stitches that need to be taken out. Leave the bandage on until you go to the dialysis unit for training.   If you have any questions or problems, call the Vascular Access Nurse. Be sure you have check up appointments at 1 - 2 weeks and 6 weeks after surgery.   Make sure you also have an appointment at your dialysis center to be trained to do your peritoneal dialysis. Call your dialysis center to start your training.  GET HELP RIGHT AWAY IF:   The place around your tube is red, swollen or has pain.   There are bumps.   Your skin feels hot.   Your surgery area keeps on bleeding.   You have drainage of any kind from your wound.   You have a fever over 100 F (37.7 C).  Document Released: 03/24/2009 Document Revised: 03/25/2011 Document Reviewed: 03/24/2009

## 2011-06-25 NOTE — ED Provider Notes (Signed)
History     CSN: [id number]  Arrival date & time 06/25/11  0530   First MD Initiated Contact with Patient 06/25/11 [phone]      Chief Complaint  Patient presents with  . Shortness of Breath    (Consider location/radiation/quality/duration/timing/severity/associated sxs/prior treatment) The history is provided by the patient. No language interpreter was used.    71 year old female with history of chronic kidney disease heading towards dialysis, is presenting today with chief complaints of shortness of breath. She states her chronic disease has been progressing. She had a recent conversation with her primary care doctor in regards to starting dialysis. The patient states she is not ready to start dialysis yet. However for the past several days she has been having increased shortness of breath and increased pedal edema. Last night she woke up feeling out of breath. She usually sleep with 2 pillows but last night she was sleeping on her loveseat to improve breathing.  She does not use oxygen at home.  Patient denies fever, headache, chest pain, back pain, abdominal pain, nausea, vomiting, diarrhea, dysuria, numbness or weakness. She denies any medication changes.  Past Medical History  Diagnosis Date  . Depression   . Hyperlipidemia   . Hypertension   . Diabetes mellitus     Past Surgical History  Procedure Date  . Hernia repair   . Abdominal hysterectomy     Family History  Problem Relation Age of Onset  . Kidney disease Mother   . COPD Father     smoke  . Cancer Father     Lung  . Hypertension Father   . Cancer Brother     prostate  . Kidney disease Sister     History  Substance Use Topics  . Smoking status: Never Smoker   . Smokeless tobacco: Never Used  . Alcohol Use: No    OB History    Grav Para Term Preterm Abortions TAB SAB Ect Mult Living                  Review of Systems  All other systems reviewed and are negative.    Allergies  Review of  patient's allergies indicates no known allergies.  Home Medications   Current Outpatient Rx  Name Route Sig Dispense Refill  . AMLODIPINE BESYLATE 10 MG PO TABS Oral Take 10 mg by mouth daily.      . ASPIRIN 81 MG PO TABS Oral Take 81 mg by mouth daily.      . ATORVASTATIN CALCIUM 40 MG PO TABS Oral Take 1 tablet (40 mg total) by mouth daily. For cholesterol. 90 tablet 2  . CARVEDILOL 12.5 MG PO TABS Oral Take 1 tablet (12.5 mg total) by mouth 2 (two) times daily with a meal. 60 tablet 5  . CETIRIZINE HCL 10 MG PO TABS Oral Take 10 mg by mouth daily. For allergies.     . DOCUSATE SODIUM 100 MG PO CAPS Oral Take 100 mg by mouth 2 (two) times daily.      . FUROSEMIDE 80 MG PO TABS Oral Take 1 tablet (80 mg total) by mouth daily. 30 tablet 0    Patient needs to see nephrologist before next refi ...  . GLIPIZIDE 10 MG PO TABS Oral Take 1 tablet (10 mg total) by mouth 2 (two) times daily before a meal. 60 tablet 3  . HYDRALAZINE HCL 50 MG PO TABS Oral Take 50 mg by mouth 2 (two) times daily.    Marland Kitchen  KETOTIFEN FUMARATE 0.025 % OP SOLN Both Eyes Place 1 drop into both eyes 2 (two) times daily.     Marland Kitchen LANTUS SOLOSTAR 100 UNIT/ML [place] SOLN  INJECT 40 UNITS SUBCUTANEOSLY EACH EVENING 45 mL 0  . ONE DAILY WOMENS PO TABS Oral Take by mouth daily.        BP 155/55  Pulse 80  Temp(Src) 98.3 F (36.8 C) (Oral)  Resp 20  SpO2 100%  Physical Exam  Nursing note and vitals reviewed. Constitutional: She appears well-developed and well-nourished. No distress.       Awake, alert, nontoxic appearance. Morbidly obese  HENT:  Head: Atraumatic.  Mouth/Throat: Oropharynx is clear and moist. No oropharyngeal exudate.  Eyes: Conjunctivae are normal. Right eye exhibits no discharge. Left eye exhibits no discharge.  Neck: Neck supple.  Cardiovascular: Normal rate, regular rhythm and intact distal pulses.   Pulmonary/Chest: Effort normal. No respiratory distress. She exhibits no tenderness.       Decreased  bilateral lower lung sounds with no obvious rales  Abdominal: Soft. There is no tenderness. There is no rebound.  Musculoskeletal: She exhibits no tenderness.       ROM appears intact, no obvious focal weakness. 1+ pitting edema bilaterally  Lymphadenopathy:    She has no cervical adenopathy.  Neurological:       Mental status and motor strength appears intact  Skin: No rash noted.  Psychiatric: She has a normal mood and affect.    ED Course  Procedures (including critical care time)  Labs Reviewed - No data to display No results found.   No diagnosis found.   Date: 06/25/2011  Rate: 74  Rhythm: normal sinus rhythm  QRS Axis: normal  Intervals: normal  ST/T Wave abnormalities: normal  Conduction Disutrbances:none  Narrative Interpretation:   Old EKG Reviewed: unchanged  Results for orders placed during the hospital encounter of 06/25/11  CBC      Component Value Range   WBC 8.3  4.0 - 10.5 (K/uL)   RBC 2.99 (*) 3.87 - 5.11 (MIL/uL)   Hemoglobin 9.3 (*) 12.0 - 15.0 (g/dL)   HCT 29.5 (*) 36.0 - 46.0 (%)   MCV 98.7  78.0 - 100.0 (fL)   MCH 31.1  26.0 - 34.0 (pg)   MCHC 31.5  30.0 - 36.0 (g/dL)   RDW 13.4  11.5 - 15.5 (%)   Platelets 272  150 - 400 (K/uL)  DIFFERENTIAL      Component Value Range   Neutrophils Relative 80 (*) 43 - 77 (%)   Neutro Abs 6.7  1.7 - 7.7 (K/uL)   Lymphocytes Relative 13  12 - 46 (%)   Lymphs Abs 1.0  0.7 - 4.0 (K/uL)   Monocytes Relative 6  3 - 12 (%)   Monocytes Absolute 0.5  0.1 - 1.0 (K/uL)   Eosinophils Relative 1  0 - 5 (%)   Eosinophils Absolute 0.1  0.0 - 0.7 (K/uL)   Basophils Relative 0  0 - 1 (%)   Basophils Absolute 0.0  0.0 - 0.1 (K/uL)  BASIC METABOLIC PANEL      Component Value Range   Sodium 137  135 - 145 (mEq/L)   Potassium 5.1  3.5 - 5.1 (mEq/L)   Chloride 103  96 - 112 (mEq/L)   CO2 23  19 - 32 (mEq/L)   Glucose, Bld 203 (*) 70 - 99 (mg/dL)   BUN 58 (*) 6 - 23 (mg/dL)   Creatinine, Ser 3.13 (*) 0.50 -  1.10  (mg/dL)   Calcium 8.6  8.4 - 10.5 (mg/dL)   GFR calc non Af Amer 14 (*) >90 (mL/min)   GFR calc Af Amer 16 (*) >90 (mL/min)  URINALYSIS, ROUTINE W REFLEX MICROSCOPIC      Component Value Range   Color, Urine YELLOW  YELLOW    APPearance CLEAR  CLEAR    Specific Gravity, Urine 1.015  1.005 - 1.030    pH 6.0  5.0 - 8.0    Glucose, UA 100 (*) NEGATIVE (mg/dL)   Hgb urine dipstick NEGATIVE  NEGATIVE    Bilirubin Urine NEGATIVE  NEGATIVE    Ketones, ur NEGATIVE  NEGATIVE (mg/dL)   Protein, ur >300 (*) NEGATIVE (mg/dL)   Urobilinogen, UA 0.2  0.0 - 1.0 (mg/dL)   Nitrite NEGATIVE  NEGATIVE    Leukocytes, UA NEGATIVE  NEGATIVE   PRO B NATRIURETIC PEPTIDE      Component Value Range   Pro B Natriuretic peptide (BNP) 731.5 (*) 0 - 125 (pg/mL)  GLUCOSE, CAPILLARY      Component Value Range   Glucose-Capillary 170 (*) 70 - 99 (mg/dL)  URINE MICROSCOPIC-ADD ON      Component Value Range   Squamous Epithelial / LPF FEW (*) RARE    WBC, UA 3-6  <3 (WBC/hpf)   RBC / HPF 0-2  <3 (RBC/hpf)   Bacteria, UA FEW (*) RARE   POCT I-STAT TROPONIN I      Component Value Range   Troponin i, poc 0.01  0.00 - 0.08 (ng/mL)   Comment 3            Dg Chest 2 View  06/25/2011  *RADIOLOGY REPORT*  Clinical Data: Shortness of breath.  Cough.  CHEST - 2 VIEW  Comparison: Two-view chest 06/14/2009.  Findings: Mild cardiac enlargement is present.  Mild interstitial edema is evident.  There are small bilateral pleural effusions. Minimal bibasilar airspace disease likely reflects atelectasis.  IMPRESSION:  1.  Mild cardiomegaly, edema, and small effusions are compatible with congestive heart failure.  This appears slightly worse than on the prior exam. 2.  Minimal bibasilar airspace disease likely reflects atelectasis.  Original Report Authenticated By: Resa Miner. MATTERN, M.D.      MDM  SOB in a CKD pt.  Currently in no acute respiratory distress.  CXR shows edema and small effusion compatible with CHF.  Lasix  given, pt is being weighted.  Discussed care with my attending.     12:58 PM Pt currently in NAD,  Able to diurese, satting well on 2L oxygen. Evidence of CHF, decrease renal function.  Discussed with [place] resident Jillene Bucks), who suggest pt to f/u outpatient on Monday.  Pt voice understanding and agrees with plan.  Schedule for appointment with Dr. Barbra Sarks @ 9:15am on Monday.    2:14 PM Patient desats to 89/91% on room air with ambulating. She has increased shortness of breath. We'll consult with family practice medicine again for admission.  2:31 PM [facility] agrees to admit patient.  Pt to be admit to a Telemetry bed, under the care of Dr. Mallie Mussel.  Pt is stable to be transfer.     Domenic Moras, PA-C 06/25/11 [place], PA-C 06/25/11 1432

## 2011-06-25 NOTE — ED Notes (Signed)
Onset SOB approx 03:30 this AM. Exp wheezing. Crackles in bases bilat. +3 pedal edema bilat.

## 2011-06-25 NOTE — ED Notes (Signed)
[id number] Expected date:06/25/11<BR> Expected time: 4:51 AM<BR> Means of arrival:Ambulance<BR> Comments:<BR> Difficulty breathing, extremity edema

## 2011-06-25 NOTE — ED Notes (Signed)
Pt alert and oriented x4. Respirations even and unlabored, bilateral symmetrical rise and fall of chest. Skin warm and dry. In no acute distress. Denies needs.   

## 2011-06-25 NOTE — ED Notes (Signed)
Pt requesting meal. EDMD notified and orders given.

## 2011-06-25 NOTE — ED Notes (Signed)
Daughter viola on the phone with pt. Pt gave rn verbal consent for rn to discuss pts medical treatment with daughter.

## 2011-06-25 NOTE — ED Notes (Signed)
Now that pt is in bed and without excertion, SOB had diminished somewhat.

## 2011-06-25 NOTE — ED Notes (Signed)
Before discharging pt. rn stopped oxygen and will assess pts respiratory status and o2 stats. rn will then have pt ambulate while on pulse ox.

## 2011-06-26 ENCOUNTER — Inpatient Hospital Stay (HOSPITAL_COMMUNITY): Payer: Medicare Other

## 2011-06-26 DIAGNOSIS — N186 End stage renal disease: Secondary | ICD-10-CM

## 2011-06-26 DIAGNOSIS — E118 Type 2 diabetes mellitus with unspecified complications: Secondary | ICD-10-CM

## 2011-06-26 DIAGNOSIS — R0602 Shortness of breath: Secondary | ICD-10-CM

## 2011-06-26 LAB — GLUCOSE, CAPILLARY
Glucose-Capillary: 160 mg/dL — ABNORMAL HIGH (ref 70–99)
Glucose-Capillary: 176 mg/dL — ABNORMAL HIGH (ref 70–99)

## 2011-06-26 LAB — CBC
HCT: 28 % — ABNORMAL LOW (ref 36.0–46.0)
MCHC: 32.1 g/dL (ref 30.0–36.0)
MCV: 97.9 fL (ref 78.0–100.0)
RDW: 13.4 % (ref 11.5–15.5)
WBC: 7.2 10*3/uL (ref 4.0–10.5)

## 2011-06-26 LAB — COMPREHENSIVE METABOLIC PANEL
ALT: 47 U/L — ABNORMAL HIGH (ref 0–35)
Albumin: 3 g/dL — ABNORMAL LOW (ref 3.5–5.2)
Alkaline Phosphatase: 243 U/L — ABNORMAL HIGH (ref 39–117)
Potassium: 5.2 mEq/L — ABNORMAL HIGH (ref 3.5–5.1)
Sodium: 141 mEq/L (ref 135–145)
Total Protein: 6.4 g/dL (ref 6.0–8.3)

## 2011-06-26 LAB — CARDIAC PANEL(CRET KIN+CKTOT+MB+TROPI)
Relative Index: 1.6 (ref 0.0–2.5)
Troponin I: <0.3 ng/mL (ref <0.30)
Troponin I: <0.3 ng/mL (ref <0.30)
Troponin I: <0.3 ng/mL (ref <0.30)

## 2011-06-26 LAB — PROTEIN / CREATININE RATIO, URINE: Protein Creatinine Ratio: 2.6 — ABNORMAL HIGH (ref 0.00–0.15)

## 2011-06-26 LAB — HEMOGLOBIN A1C: Mean Plasma Glucose: 169 mg/dL — ABNORMAL HIGH (ref <117)

## 2011-06-26 MED ORDER — OLOPATADINE HCL 0.1 % OP SOLN
1.0000 [drp] | Freq: Two times a day (BID) | OPHTHALMIC | Status: DC
  Administered 2011-06-26 – 2011-06-28 (×6): 1 [drp] via OPHTHALMIC
  Filled 2011-06-26: qty 5

## 2011-06-26 NOTE — Progress Notes (Signed)
  Echocardiogram 2D Echocardiogram has been performed.  Rachel Hobbs L 06/26/2011, 11:36 AM

## 2011-06-26 NOTE — Discharge Summary (Signed)
Family Medicine Teaching Service DISCHARGE SUMMARY   Patient name: Rachel Hobbs record number: [id number] Date of birth: 11/13/1940 Age: 71 y.o. Gender: female  Attending Physician: No att. providers found Primary Care Provider: Cletus Gash, MD, MD Consultants: nephrology  Dates of Hospitalization:  06/25/2011 to 07/08/2011 Length of Stay: 3 days  Admission Diagnoses:  Dyspnea  Discharge Diagnoses:  Active Problems:  DIABETES MELLITUS II, UNCOMPLICATED  HYPERLIPIDEMIA  ANEMIA, IRON DEFICIENCY, UNSPEC.  HYPERTENSION, BENIGN SYSTEMIC  EDEMA-LEGS,DUE TO VENOUS OBSTRUCT.  ABDOMINAL INCISIONAL HERNIA  RENAL INSUFFICIENCY, CHRONIC  LEG EDEMA, BILATERAL  End stage renal disease  Diastolic CHF  Patient Active Problem List  Diagnoses Date Noted  . Diastolic CHF [id number]  . End stage renal disease 06/14/2011  . Dyspnea 05/11/2011  . GOUT, ACUTE 11/03/2009  . FOOT PAIN, RIGHT 12/11/2008  . LEG EDEMA, BILATERAL 12/03/2008  . ABDOMINAL INCISIONAL HERNIA 07/10/2007  . SMALL BOWEL OBSTRUCTION, HX OF 02/15/2007  . OSTEOARTHROSIS, LOCAL NOS, OTHER [facility] SITE 12/08/2006  . Carpal tunnel syndrome 08/17/2006  . GLAUCOMA 08/17/2006  . HAIR LOSS 08/17/2006  . DIABETES MELLITUS II, UNCOMPLICATED [id number]  . HYPERLIPIDEMIA 06/16/2006  . OBESITY, NOS 06/16/2006  . ANEMIA, IRON DEFICIENCY, UNSPEC. 06/16/2006  . HYPERTENSION, BENIGN SYSTEMIC 06/16/2006  . EDEMA-LEGS,DUE TO VENOUS OBSTRUCT. 06/16/2006  . RENAL INSUFFICIENCY, CHRONIC 06/16/2006    Brief Hospital Course   Rachel Hobbs is a 72 y.o. year old female who presented with 1 to 2 weeks of worsening dyspnea and lower extremity swelling.  She had recently been evaluated for fistula placement due to her CKD and was found to have a Cr of 3.1 with a BUN/Cr ratio of 18 at the time of admission.   She was treated for cardiac versus pulmonary causes of dyspnea and had improved prior to her discharge.  The remainder of her  hospital course is as follows:  1. RESP (Dyspnea, Hypoxia)  *DuoNebs prn  - Dyspnea, slightly subjectively improved, no hypoxia.  - Will wean oxygen and obtain a ambulatory pulse ox  -    2. RENAL (Acute on Chronic Kidney Disease[diabetic nephropathy], Hyperkalemia) *Lasix 80 mg by mouth twice a day starting today  - Renal suggesting followup as outpatient, as well as followup with vascular surgery for placement of AV fistula.  - Question utility of ACE inhibitor at this time-  - AM labs pending    3. Abdominal Wall Hernia - Long standing - no plans for intervention   4. CV (HTN) -pt underwent diuresis with IV lasix -ProBNP 700, question if this is potential cardiac special in setting of profound lower extremity edema. - 2D ECHO 55-60% EF with Grade II diastolic -Amlodipine held for lower severe edema   5. HEME (Normocytic Anemia) - Renal to further work this up as outpatient, likely anemia of chronic disease   7. ENDO/MET (DM2, HLD) -Pt was continued on Lantus & SSI -Pt continued on Lipitor  Significant Diagnostics:   LABS:  Basename 07/01/11 1101 06/27/11 1000 06/26/11 0825 06/25/11 0800  WBC 7.1 6.2 7.2 8.3  HGB 9.9* 8.7* 9.0* 9.3*  HCT 31.8* 27.3* 28.0* 29.5*  PLT 303 229 258 272    Basename 07/01/11 1101 06/28/11 0950 06/27/11 1000 06/26/11 0825 06/25/11 0800  NA 143 138 138 141 137  K 4.8 4.9 5.0 5.2* 5.1  CL 103 102 104 106 103  CO2 26 24 23 24 23   BUN 63* 65* 61* 59* 58*  CREATININE 3.34* 3.24* 3.34* 3.39* 3.13*  CALCIUM  8.7 8.7 8.2* 8.5 8.6  GLUCOSE 157* 113* 141* [id number]* 123456*   Metabolic:  Basename [id number]  MG --  PHOS 5.0*    Basename 06/27/11 1000 06/26/11 0825  ALT 46* 47*  AST 24 21  ALKPHOS 249* 243*  BILITOT 0.3 0.3  BILIDIR -- --    Basename 06/28/11 0950 06/27/11 1000 06/26/11 0825  PROT -- 6.1 6.4  ALBUMIN 3.0* 2.9* 3.0*  PREALBUMIN -- -- --    Basename 06/28/11 1506 06/28/11 1157 06/28/11 0938 06/28/11 0827 06/28/11  0752 06/27/11 2251 06/27/11 1657 06/27/11 1139 06/27/11 0802 06/26/11 2205  GLUCAP 118* 75 134* 62* 48* 104* 145* 120* 122* 176*    Basename 06/26/11 0825 02/15/11 1015 09/25/10 1101  HGBA1C 7.5* 8.4 8.3    Basename 06/28/11 1558  IRON 30*  TIBC 244*  FERRITIN 175     Hematology:  Basename 06/25/11 0800  LYMPHSABS 1.0  MONOABS 0.5  EOSABS 0.1  BASOSABS 0.0    Basename 07/01/11 1101 06/27/11 1000 06/26/11 0825  RDW 13.6 13.3 13.4  MCV 99.4 98.6 97.9  MCHC 31.1 31.9 32.1  MRET -- -- --    Other Labs:  Basename 06/26/11 1312 06/26/11 0825 06/26/11 0136  CKTOTAL 254* 273* 291*  TROPONINI <0.30 <0.30 <0.30  TROPONINT -- -- --  CKMBINDEX -- -- --   MICRO: Urinalysis  Basename 06/25/11 0757  COLORURINE YELLOW  APPEARANCEUR CLEAR  LABSPEC 1.015  PHURINE 6.0  GLUCOSEU 100*  HGBUR NEGATIVE  BILIRUBINUR NEGATIVE  KETONESUR NEGATIVE  PROTEINUR >300*  UROBILINOGEN 0.2  NITRITE NEGATIVE  LEUKOCYTESUR NEGATIVE    IMAGING: CHEST - 2 VIEW 06/25/11 Findings: Mild cardiac enlargement is present. Mild interstitial  edema is evident. There are small bilateral pleural effusions.  Minimal bibasilar airspace disease likely reflects atelectasis.  IMPRESSION:  1. Mild cardiomegaly, edema, and small effusions are compatible  with congestive heart failure. This appears slightly worse than on  the prior exam.  2. Minimal bibasilar airspace disease likely reflects atelectasis.  Procedures:   2D ECHO - EF 55-60% with Grade 2 diastolic dysfunction;   Discharge Vitals and Exam:  Vitals: Patient Vitals for the past 24 hrs:  BP  Temp  Temp src  Pulse  Resp  SpO2  Weight   06/28/11 1400  161/59 mmHg  97.5 F (36.4 C)  Oral  74  20  100 %  -   06/28/11 1000  176/71 mmHg  98.7 F (37.1 C)  Oral  76  18  94 %  -   06/28/11 0605  108/80 mmHg  98.8 F (37.1 C)  Oral  72  18  99 %  -   06/27/11 2255  166/64 mmHg  98.2 F (36.8 C)  Oral  65  18  99 %  278 lb (126.1 kg)     06/27/11 1710  -  -  -  -  -  100 %  -   06/27/11 1709  146/64 mmHg  98 F (36.7 C)  Oral  88  18  100 %  -     Wt Readings from Last 3 Encounters:  07/01/11 266 lb (120.657 kg)  06/27/11 278 lb (126.1 kg)  06/14/11 275 lb (124.739 kg)   No intake or output data in the 24 hours ending 07/08/11 [id number]  PE: GENERAL: An elderly, obese, African American female, examined him and [place] [id number]. Alert and appropriate. In no acute discomfort, no respiratory distress. No conversational dyspnea  HNEENT: AT/[place], MMM,  no scleral icterus, EOMi  THORAX: T: Distant heart sounds due to body habitus. no murmur appreciated  LUNGS: HEAR CTA B, no wheezes, faint crackles in B bases; resolves with coughing.  ABDOMEN: +BS, soft, non-tender, no rigidity, no guarding, large ventral hernia noted  EXTREMITIES: 4+ bilateral pitting edema chronic venous stasis changes, one out of 4 distal pulses.    Discharge Information   Disposition: To Home Discharge Diet: Resume diet Discharge Condition:  Improved Discharge Activity: Ad lib Medication List  As of 07/08/2011  6:38 AM   TAKE these medications         amLODipine 10 MG tablet   Commonly known as: NORVASC   Take 1 tablet (10 mg total) by mouth daily.      aspirin 81 MG tablet   Take 81 mg by mouth daily.      atorvastatin 40 MG tablet   Commonly known as: LIPITOR   Take 1 tablet (40 mg total) by mouth daily. For cholesterol.      carvedilol 12.5 MG tablet   Commonly known as: COREG   Take 1 tablet (12.5 mg total) by mouth 2 (two) times daily with a meal.      cetirizine 10 MG tablet   Commonly known as: ZYRTEC   Take 10 mg by mouth daily. For allergies.      docusate sodium 100 MG capsule   Commonly known as: COLACE   Take 100 mg by mouth 2 (two) times daily.      furosemide 80 MG tablet   Commonly known as: LASIX   Take 160 mg (2 tablets) in the morning and 80 mg (1 tablet) in the afternoon.      glipiZIDE 10 MG tablet   Commonly known as:  GLUCOTROL   Take 1 tablet (10 mg total) by mouth 2 (two) times daily before a meal.      hydrALAZINE 50 MG tablet   Commonly known as: APRESOLINE   Take 50 mg by mouth 2 (two) times daily.      ketotifen 0.025 % ophthalmic solution   Commonly known as: ZADITOR   Place 1 drop into both eyes 2 (two) times daily.      LANTUS SOLOSTAR 100 UNIT/ML injection   Generic drug: insulin glargine   INJECT 40 UNITS SUBCUTANEOSLY EACH EVENING      One Daily Womens Tabs   Take by mouth daily.            Follow Up Issues and Recommendations:    Discharge Orders    Future Appointments: Provider: Department: Dept Phone: Center:   07/12/2011 3:00 PM [facility] [id number] [facility]   07/12/2011 3:45 PM [name], MD [facility] [phone] [facility]     Follow-up Information    Follow up with [facility], MD in 3 days. (Appointment at 9:15am)    Contact information:   [place] [phone]       Please follow up. (Keep appointment with Vascular Surgery on 03/25)       Follow up with Louis Meckel, MD on 07/07/2011. (@ 03:15 pm)    Contact information:   [place] [phone]         Pending Results: Fe+, TIBC, Ferritin  Issues and Medications to address: Chronic Venous Stasis & Grade II Diastolic CHF - Started on Lasix 120mg  po qam + 80mg  po   Follow up Lasix Dosing  Follow up BMET on  3/14 and trend  Anemia - Started aranesp 154mcg q 2 weeks  Follow up CBC on 3/14 and trend  Gerda Diss, DO [facility] Resident - PGY-1 07/08/2011 6:38 AM

## 2011-06-26 NOTE — H&P (Signed)
[facility] Attending Admission Note: Bralen Wiltgen MD [phone] pager office [phone] I  have seen and examined this patient, reviewed their chart. I have discussed this patient with the resident. I agree with the resident's findings, assessment and care plan. 

## 2011-06-26 NOTE — Progress Notes (Signed)
Subjective: Patient feels much better this morning. She states her breathing is closer but not quite yet to baseline. Her main questions concerning her abdominal hernia and its potential repair. She slept well last night. Eating well. No nausea or vomiting.  Objective: Vital signs in last 24 hours: Temp:  [97.5 F (36.4 C)-99.1 F (37.3 C)] 98.2 F (36.8 C) (03/09 0619) Pulse Rate:  [65-90] 65  (03/09 0619) Resp:  [15-24] 15  (03/09 0619) BP: (140-168)/(54-76) 168/70 mmHg (03/09 0619) SpO2:  [94 %-100 %] 96 % (03/09 0619) Weight:  [276 lb 14.4 oz (125.6 kg)] 276 lb 14.4 oz (125.6 kg) (03/08 0758) Weight change:  Last BM Date: 06/24/11  Intake/Output from previous day: 03/08 0701 - 03/09 0700 In: 580 [P.O.:480] Out: -  Intake/Output this shift:    General appearance: alert, cooperative, appears stated age and no distress Resp: Some mild bibasilar wheezes. Minimal crackles at bases. Distant breath sounds secondary to body habitus. Cardio: Regular rate and rhythm. Questionable S4 exam. No murmur. Distant heart sounds secondary to body habitus. GI: Soft and nondistended. Large left lateral ventral hernia noted. Tympanic and nontender. Extremities: Chronic venous stasis changes noted as well as +4 bilateral pitting edema to mid thighs. Skin:  Chronic venous stasis changes bilateral lower extremities.  Lab Results:  Basename 06/25/11 0800  WBC 8.3  HGB 9.3*  HCT 29.5*  PLT 272   BMET  Basename 06/25/11 0800  NA 137  K 5.1  CL 103  CO2 23  GLUCOSE 203*  BUN 58*  CREATININE 3.13*  CALCIUM 8.6    Studies/Results: Dg Chest 2 View  06/25/2011  *RADIOLOGY REPORT*  Clinical Data: Shortness of breath.  Cough.  CHEST - 2 VIEW  Comparison: Two-view chest 06/14/2009.  Findings: Mild cardiac enlargement is present.  Mild interstitial edema is evident.  There are small bilateral pleural effusions. Minimal bibasilar airspace disease likely reflects atelectasis.  IMPRESSION:  1.  Mild  cardiomegaly, edema, and small effusions are compatible with congestive heart failure.  This appears slightly worse than on the prior exam. 2.  Minimal bibasilar airspace disease likely reflects atelectasis.  Original Report Authenticated By: Resa Miner. MATTERN, M.D.   Abd 1 View (kub)  06/26/2011  *RADIOLOGY REPORT*  Clinical Data: Left-sided abdominal protrusion.  ABDOMEN - 1 VIEW  Comparison: 10/10/2008 CT  Findings: Left lateral abdominal wall laxity and/or hernia with bowel contents predominately located lateral to the left hemiabdomen. No evidence for bowel obstruction.  Organ outlines normal where seen.  No acute osseous abnormality.  IMPRESSION: Large left lateral abdominal wall laxity / hernia, without evidence for bowel obstruction.  Original Report Authenticated By: Suanne Marker, M.D.    Medications: I have reviewed the patient's current medications.  Assessment/Plan: 71 year old female admitted for worsening dyspnea. Questionable cardiac versus pulmonary component versus simply obesity hypoventilation: #1. Dyspnea: Subjectively improved today. She still has some mild wheezing on exam. She uses currently on nebulized treatments on when necessary basis, however she slept the night and did not require any nebulizer treatment. She does have mild crackles on exam. Her chest x-ray from yesterday did show some mild pleural effusions. Question whether this is from cardiac process. Last echo was in 2006 showed normal EF. Pro BNP was 700 which is still within normal range for her age. Other possibilities fluid overload secondary to acute on chronic kidney disease  #2. Acute on chronic kidney disease: Patient has had steadily worsening renal function the past year or so. She is  currently making urine. We have written for strict ins and outs. Her lab values have not returned back this morning. Plan is to consult renal service for further evaluation and recommendations. Her spot protein  creatinine ratio was greater than 2.  Will also need to evaluate her potassium once it returns for today's labs.  Likely worsening secondary to long-standing diabetes and hypertension.  3. Abdominal mass: KUB did indeed show likely abdominal wall laxity versus return of ventral hernia. No signs of bowel obstruction. This seems to be the patient's main concern. I discussed with her that we need to further elucidate the etiology of her acute renal failure before she would be cleared for his surgery. Patient seemed okay with this.  4. HTN: Continue home Coreg, hydralazine. Hold Amlodipine for LE . Recommend possible ACE pending further renal rec's.   5. DM2: Continue home Lasix at 40 units. Hold glipizide to prevent hypoglycemia.  6. Normocytic anemia:  Likely anemia of chronic disease in this patient with longstanding renal failure.   7. LE edema: Likely combination of CHF, renal failure, and venous stasis. She does not tolerate compression stockings well.   8. PPX: Lovenox 30 due to creatinine clearance   9. FEN/GI: CMET in AM to assess for albumin and liver function   10. Dispo: Pending further workup and evaluations by Renal Service      LOS: 1 day   Neleh Muldoon,JEFF 06/26/2011, 7:21 AM

## 2011-06-26 NOTE — H&P (Signed)
Rachel Hobbs is an 71 y.o. female.   Chief Complaint: shortness of breath and swelling in the legs HPI:  71 yo female with h/o DM, HTN, CKD stage 4 who presented with 1-2 weeks of worsening shortness of breath and lower extremity swelling. She reports shortness of breath with activity at baseline but had some worsening shortness of breath at rest over last weekend which had improved over the week but became acutely worst this morning at which point she came to the ED. She has 2 pillow orthopnea at baseline but has recently had to sleep in the reclining chair. She also mentions that her lower extremity edema has gotten mildly worst in the last couple of months. She doesn't wear support stockings due to discomfort. She denies having ever felt like this in the past. She denies any history of asthma or COPD. She has been having some chills but no fever or sick contacts.   She also mentions an enlarging area on the left side of her abdomen where she had hernia repair in the past (2010). She feels like it has become more swollen than usual in the last month or so. She denies any abdominal pain, any nausea, vomiting. She has been having small bowel movements and has been passing gas.   At the [facility] ED, she was given lasix IV 40mg  x1 and put on 2L of oxygen. She was going to be set up for outpatient follow up but was found to have oxygen desaturations around 89-90% while ambulating on room air with increased dyspnea at which point she was admitted to the [facility].   Past Medical History  Diagnosis Date  . Depression   . Hyperlipidemia   . Hypertension    Chronic Kidney Disease stage 4- seen by Dr. Moshe Cipro    Obesity   . Diabetes mellitus     Past Surgical History  Procedure Date  . Hernia repair: incarcerated ventral hernia 02/01/2007  . Abdominal hysterectomy     Family History  Problem Relation Age of Onset  . Kidney disease Mother   . COPD Father     smoke  . Cancer Father     Lung   . Hypertension Father   . Cancer Brother     prostate  . Kidney disease Sister    Social History:  reports that she has never smoked. She has never used smokeless tobacco. She reports that she does not drink alcohol or use illicit drugs.  Allergies: No Known Allergies  Medications Prior to Admission  Medication Sig Dispense Refill  . amLODipine (NORVASC) 10 MG tablet Take 10 mg by mouth daily.        Marland Kitchen aspirin 81 MG tablet Take 81 mg by mouth daily.        Marland Kitchen atorvastatin (LIPITOR) 40 MG tablet Take 1 tablet (40 mg total) by mouth daily. For cholesterol.  90 tablet  2  . carvedilol (COREG) 12.5 MG tablet Take 1 tablet (12.5 mg total) by mouth 2 (two) times daily with a meal.  60 tablet  5  . cetirizine (ZYRTEC) 10 MG tablet Take 10 mg by mouth daily. For allergies.       Marland Kitchen docusate sodium (COLACE) 100 MG capsule Take 100 mg by mouth 2 (two) times daily.        . furosemide (LASIX) 80 MG tablet Take 1 tablet (80 mg total) by mouth daily.  30 tablet  0  . glipiZIDE (GLUCOTROL) 10 MG tablet Take 1  tablet (10 mg total) by mouth 2 (two) times daily before a meal.  60 tablet  3  . ketotifen (ZADITOR) 0.025 % ophthalmic solution Place 1 drop into both eyes 2 (two) times daily.       Marland Kitchen LANTUS SOLOSTAR 100 UNIT/ML injection INJECT 40 UNITS SUBCUTANEOSLY EACH EVENING  45 mL  0  . Multiple Vitamins-Minerals (ONE DAILY WOMENS) TABS Take by mouth daily.          Results for orders placed during the hospital encounter of 06/25/11 (from the past 48 hour(s))  GLUCOSE, CAPILLARY     Status: Abnormal   Collection Time   06/25/11  7:45 AM      Component Value Range Comment   Glucose-Capillary 170 (*) 70 - 99 (mg/dL)   URINALYSIS, ROUTINE W REFLEX MICROSCOPIC     Status: Abnormal   Collection Time   06/25/11  7:57 AM      Component Value Range Comment   Color, Urine YELLOW  YELLOW     APPearance CLEAR  CLEAR     Specific Gravity, Urine 1.015  1.005 - 1.030     pH 6.0  5.0 - 8.0     Glucose, UA 100 (*)  NEGATIVE (mg/dL)    Hgb urine dipstick NEGATIVE  NEGATIVE     Bilirubin Urine NEGATIVE  NEGATIVE     Ketones, ur NEGATIVE  NEGATIVE (mg/dL)    Protein, ur >300 (*) NEGATIVE (mg/dL)    Urobilinogen, UA 0.2  0.0 - 1.0 (mg/dL)    Nitrite NEGATIVE  NEGATIVE     Leukocytes, UA NEGATIVE  NEGATIVE    URINE MICROSCOPIC-ADD ON     Status: Abnormal   Collection Time   06/25/11  7:57 AM      Component Value Range Comment   Squamous Epithelial / LPF FEW (*) RARE     WBC, UA 3-6  <3 (WBC/hpf)    RBC / HPF 0-2  <3 (RBC/hpf)    Bacteria, UA FEW (*) RARE    CBC     Status: Abnormal   Collection Time   06/25/11  8:00 AM      Component Value Range Comment   WBC 8.3  4.0 - 10.5 (K/uL)    RBC 2.99 (*) 3.87 - 5.11 (MIL/uL)    Hemoglobin 9.3 (*) 12.0 - 15.0 (g/dL)    HCT 29.5 (*) 36.0 - 46.0 (%)    MCV 98.7  78.0 - 100.0 (fL)    MCH 31.1  26.0 - 34.0 (pg)    MCHC 31.5  30.0 - 36.0 (g/dL)    RDW 13.4  11.5 - 15.5 (%)    Platelets 272  150 - 400 (K/uL)   DIFFERENTIAL     Status: Abnormal   Collection Time   06/25/11  8:00 AM      Component Value Range Comment   Neutrophils Relative 80 (*) 43 - 77 (%)    Neutro Abs 6.7  1.7 - 7.7 (K/uL)    Lymphocytes Relative 13  12 - 46 (%)    Lymphs Abs 1.0  0.7 - 4.0 (K/uL)    Monocytes Relative 6  3 - 12 (%)    Monocytes Absolute 0.5  0.1 - 1.0 (K/uL)    Eosinophils Relative 1  0 - 5 (%)    Eosinophils Absolute 0.1  0.0 - 0.7 (K/uL)    Basophils Relative 0  0 - 1 (%)    Basophils Absolute 0.0  0.0 - 0.1 (K/uL)  BASIC METABOLIC PANEL     Status: Abnormal   Collection Time   06/25/11  8:00 AM      Component Value Range Comment   Sodium 137  135 - 145 (mEq/L)    Potassium 5.1  3.5 - 5.1 (mEq/L)    Chloride 103  96 - 112 (mEq/L)    CO2 23  19 - 32 (mEq/L)    Glucose, Bld 203 (*) 70 - 99 (mg/dL)    BUN 58 (*) 6 - 23 (mg/dL)    Creatinine, Ser 3.13 (*) 0.50 - 1.10 (mg/dL)    Calcium 8.6  8.4 - 10.5 (mg/dL)    GFR calc non Af Amer 14 (*) >90 (mL/min)    GFR  calc Af Amer 16 (*) >90 (mL/min)   PRO B NATRIURETIC PEPTIDE     Status: Abnormal   Collection Time   06/25/11  8:00 AM      Component Value Range Comment   Pro B Natriuretic peptide (BNP) 731.5 (*) 0 - 125 (pg/mL)   POCT I-STAT TROPONIN I     Status: Normal   Collection Time   06/25/11  8:10 AM      Component Value Range Comment   Troponin i, poc 0.01  0.00 - 0.08 (ng/mL)    Comment 3            GLUCOSE, CAPILLARY     Status: Abnormal   Collection Time   06/25/11  8:08 PM      Component Value Range Comment   Glucose-Capillary 205 (*) 70 - 99 (mg/dL)   GLUCOSE, CAPILLARY     Status: Abnormal   Collection Time   06/25/11 10:30 PM      Component Value Range Comment   Glucose-Capillary 178 (*) 70 - 99 (mg/dL)    Dg Chest 2 View  06/25/2011  *RADIOLOGY REPORT*  Clinical Data: Shortness of breath.  Cough.  CHEST - 2 VIEW  Comparison: Two-view chest 06/14/2009.  Findings: Mild cardiac enlargement is present.  Mild interstitial edema is evident.  There are small bilateral pleural effusions. Minimal bibasilar airspace disease likely reflects atelectasis.  IMPRESSION:  1.  Mild cardiomegaly, edema, and small effusions are compatible with congestive heart failure.  This appears slightly worse than on the prior exam. 2.  Minimal bibasilar airspace disease likely reflects atelectasis.  Original Report Authenticated By: Resa Miner. MATTERN, M.D.   EKG: normal sinus rhythm HR in the 70's.   ROS Negative except per HPI Blood pressure 168/76, pulse 90, temperature 99.1 F (37.3 C), temperature source Oral, resp. rate 17, weight 276 lb 14.4 oz (125.6 kg), SpO2 97.00%. Physical Exam  Physical Examination: General appearance - obese female sitting in bed, in no acute distress, comfortable appearing with Nasal canula in place, although has some dyspnea with conversation Mental status - alert, oriented to person, place, and time Eyes - pupils equal and reactive, extraocular eye movements intact Mouth -  mucous membranes moist, pharynx normal without lesions, upper and lower dentures in place  Neck - supple, no significant adenopathy Chest - wheezing heard more so in left lung, no significant crackles heard, decreased lung sounds in lower bases Heart - s1s2, rrr, no murmurs appreciated Abdomen - present bowel sounds, enlarged left side of abdomen compared to the right, non tender, soft, with increased tympany to percussion compared to right side. Vertical scar at abdomen midline.  Extremities - 4+ pitting edema in both extremities up to above the knee, venous stasis present bilaterally, +  2 dorsalis pedis pulse b/l, no tenderness to palpation.   Assessment/Plan 71 yo female with h/o HTN, DM, CKD4 who presented with acute on chronic dyspnea that appears to be secondary to fluid overload but could also be due to some underlying asthma, given presence of wheezes on exam. Given a normal white count and no evidence of pneumonia on cxr, pneumonia is an unlikely explanation for her dyspnea. Differential for her fluid overload includes CHF vs renal failure. Her last echo was in 2006 which showed an EF of 55-65% with mild increase in left ventricular wall thickness as well as an atrial septal aneurysm. Her pro-BNP was not significantly increased for her age although CXR did show evidence of edema, cardiomegaly and small effusions compatible with heart failure. Renal failure could also be a contributing factor in her fluid status as her renal function has been progressively declining in the last year.  1. Dyspnea:  - will obtain echo to evaluate for CHF - will give lasix 40mg  IV x1 tonight and reassess fluid and respiratory status tomorrow morning - strict I's and O's and daily weights - obtain 2 sets of cardiac enzymes and repeat EKG in the morning although ACS seems unlikely.  - start duonebs q4/prn in case there is a component of asthma/copd  - oxygen for O2 sats < 90%  2. Chronic Kidney disease: patient  has been seen for placement of a fistula for eventual dialysis but is still hesitant about the prospect of dialysis. Baseline creatine in 2012 was between 2.6 and 2.9. She is now at 3.1. BUN/Cr ratio is 18, making it difficult to say if this is pre-renal. Given that patient received lasix, will not obtain FeNa at this time. Will hold in getting FeUrea for now and consult with renal. - obtain spot prot/creatinine ratio - UA obtained that showed >300 protein  3. Diabetes: last A1C: 8.4 in October 2012 - repeat A1C - continue home lantus 40u since patient is eating - will hold glypizide to avoid any hypoglycemia - start Insulin sliding scale  4. Abdominal hernia: no evidence at this time of incarceration or of bowel obstruction given that patient is eating without nausea or vomiting and is having bowel movements and passing gas.  - will obtain KUB to look for any evidence of small bowel obstruction nontheless.  - obtain abdominal CT if symptoms worsen or if KUB needs follow up imaging 5. HTN: - continue home hydralazine 50mg  bid and coreg 12.5mg  bid. Will hold amlodipine for now given lower extremity edema  6. Normocytic Anemia: last CBC showed hemoglobin of 11 in 2011 making it difficult to see a recent trend. This is most likely due to anemia of chronic disease in the setting of renal failure. There is no evidence of active bleeding. Will repeat CBC in am.  7. Hyperlipidemia: continue lipitor 40mg  daily 8. Fen/Gi: - carb modified diet - CMP and CBC in morning 9. Prophylaxis:  - lovenox 30mg  daily 10. Dispo: admit patient to the tele floor   LOSQ, STEPHANIE 06/26/2011, 12:05 AM    FPTS PGY-3 Addendum  71 yo F with PMH significant for DM 2, HTN, CKD Stage 4 who is presenting with acute on chronic dyspnea for past 1-2 weeks.  Worsened acutely today and therefore she presented to ED.  She states that she has chronic dyspnea on exertion but is generally comfortable at rest until past few  weeks.  Acutely worsened this past weekend but improved by Monday, then has worsened  since then until today.  She denies any history of asthma or having been told she has heart failure.  She has had no chest pain or palpitations during this time.  She does endorse positional orthopnea, which is usual for her for years.  She does have long-standing bilateral edema which she feels has also been worsening for past several months.  Presented to ED today and improved with Lasix IV dose and O2 via nasal cannula.  Plan was to discharge home until she had desaturation episode to 89% when walking, and therefore FPTS was called for admission.  Also of note, she has noted increasing abdominal girth for past several months.  She is moving her bowels daily (although she reports only small amount of stool without melena or hematemesis) and passing flatus.  She does endorse some chills for past several days but denies any fever, nausea, vomiting, or abdominal pain.    Also of note, she is a known CKD patient whose baseline creatinine seems to about 2.65 but has been steadily increasing for the past several months.    PE:   Gen:  Alert, cooperative patient who appears stated age in no acute distress.  Vital signs reviewed. HEENT:  [place]/AT.  EOMI, PERRL.  MMM, tonsils non-erythematous, non-edematous.  External ears WNL, Bilateral TM's normal without retraction, redness or bulging.  Neck:  No thyromegaly.   Heart:  RRR without murmur.  I believe I can appreciate quiet S4  Lungs:  Diffuse moderate wheezing throughout.  Minimal bibasilar crackles Abdomen: Soft/nontender.  Left lateral aspect of abdominal wall distended and tympanic due to what appears to be large ventral wall hernia.  Previous surgical scar noted.  No tenderness with palpation.  Bowel sounds good Ext:  Chronic venous stasis changes present BL LE's.  +4 pitting edema to mid-thighs Neuro:  Neuro:  Alert and oriented to person, place, and date.  CN II-XII intact.   Sensation intact to light touch, pain, and vibration bilateral upper and lower extremities equally.  Motor function equal and strength 5/5 bilateral upper and lower extremities.  Normal gait.  Finger to nose cerebellar testing within normal limits.   Psych:  Thought process linear, not depressed or anxious appearing   A/P:   71 yo  presenting with worsening dyspnea of 1 weeks duration, although acutely worsened today, who is presenting to care with rising creatinine and GFR of 16: 1.  Dyspnea:  Patient is currently mildy dyspneic at rest but comfortable.  Subjectively much better with O2 in place.  CXR reveals mild BL effusions and pulmonary edema, and physical exam reveals the same along with some wheezing.  She does not have diagnosis of asthma or COPD, though she does have wheezing on exam today.  I'm not sure she has enough pulmonary edema to qualify as cardiac asthma.  Of note, her last Echo was in 2006 and revealed 55 - 65% EF, atrial septal aneurysm, and no evidence of aortic stenosis.  Plan will be to attempt gentle diuresis tonight, recognizing that Lasix has decreased efficacy in face of decreased GFR, and consult renal service for further recommendations in AM.  Also plan trial of Duonebs for resolution of wheezing.  Continue supplemental O2.  Plan to cycle cardiac enzymes as well, although ACS much less likely.  Repeat Echo.   2. Acute on chronic kidney failure:  Steady decline in GFR for past year or so.   She did receive 40 mg IV Lasix in ED.  She is producing  urine.  She already has U/A which showed >300 protein, we will obtain spot protein/creatinine ratio.  She has had Lasix so hold on FENa for now.  No nephrotoxic agents per history.  BUN/Cr ration is 18, which is indeterminate for pre-renal versus intrinsic renal damage.  Hold on renal ultrasound.  Daily weights and strict I/Os.   3. Abdominal mass:  Likely recurrent abdominal ventral hernia.  Tympanic to percussion.  Nontender.  She is  passing stool and flatus and not nauseous, so much less likely SBO.  We are obtaining KUB for dilated loops of bowel, possible CT scan to further evaluate her hernia in AM depending on what her KUB shows.  Much less likely anasarca as present more on Left than right side.  4. HTN:  Continue home Coreg, hydralazine.  Hold Amlodipine for LE .  Recommend possible ACE pending further renal rec's.   5. DM2:  Continue home Lasix at 40 units.  Hold glipizide to prevent hypotension.  She is not on Metformin, unclear why.   6. Normocytic anemia:  No acute blood loss.  Likely anemia of chronic disease in this patient with longstanding renal failure. 7. LE edema:  Likely combination of CHF, renal failure, and venous stasis.  She does not tolerate compression stockings well.   8. PPX:  Lovenox 30 due to creatinine clearance 9. FEN/GI:  CMET in AM to assess for albumin and liver function 10. Dispo:  Pending further workup  Annabell Sabal

## 2011-06-26 NOTE — Consult Note (Signed)
Referring Provider: No ref. provider found Primary Care Physician:  Cletus Gash, MD, MD Primary Nephrologist:  Dr. Moshe Cipro  Reason for Consultation:  Worsening renal insufficiency HPI: CKD Stage 4 with progressive renal failure followed by Dr Moshe Cipro. Increasing lower extremity. 2 D Echo with EF of 55-65% with mild increase in left ventricular wall thickness as well as an atrial septal aneurysm.  Patient has been seen for placement of a fistula for eventual dialysis. Baseline creatine in 2012 was between 2.6 and 2.9. She is now at 3.1. Secondary to diabetic nephropathy     Past Medical History  Diagnosis Date  . Depression   . Hyperlipidemia   . Hypertension   . Diabetes mellitus     Past Surgical History  Procedure Date  . Hernia repair   . Abdominal hysterectomy     Prior to Admission medications   Medication Sig Start Date End Date Taking? Authorizing Provider  amLODipine (NORVASC) 10 MG tablet Take 10 mg by mouth daily.     Yes Historical Provider, MD  aspirin 81 MG tablet Take 81 mg by mouth daily.     Yes Historical Provider, MD  atorvastatin (LIPITOR) 40 MG tablet Take 1 tablet (40 mg total) by mouth daily. For cholesterol. 12/30/10  Yes Cletus Gash, MD  carvedilol (COREG) 12.5 MG tablet Take 1 tablet (12.5 mg total) by mouth 2 (two) times daily with a meal. 02/15/11  Yes Cletus Gash, MD  cetirizine (ZYRTEC) 10 MG tablet Take 10 mg by mouth daily. For allergies.    Yes Historical Provider, MD  docusate sodium (COLACE) 100 MG capsule Take 100 mg by mouth 2 (two) times daily.     Yes Historical Provider, MD  furosemide (LASIX) 80 MG tablet Take 1 tablet (80 mg total) by mouth daily. 11/10/10  Yes Cletus Gash, MD  glipiZIDE (GLUCOTROL) 10 MG tablet Take 1 tablet (10 mg total) by mouth 2 (two) times daily before a meal. 09/25/10  Yes Mariana Arn, MD  hydrALAZINE (APRESOLINE) 50 MG tablet Take 50 mg by mouth 2 (two) times daily.   Yes Historical  Provider, MD  ketotifen (ZADITOR) 0.025 % ophthalmic solution Place 1 drop into both eyes 2 (two) times daily.    Yes Historical Provider, MD  LANTUS SOLOSTAR 100 UNIT/ML injection INJECT [place] 03/21/11  Yes Cletus Gash, MD  Multiple Vitamins-Minerals (ONE DAILY WOMENS) TABS Take by mouth daily.     Yes Historical Provider, MD    Current Facility-Administered Medications  Medication Dose Route Frequency Provider Last Rate Last Dose  . acetaminophen (TYLENOL) tablet 650 mg  650 mg Oral Q6H PRN Kandis Nab, MD   650 mg at 06/26/11 0846   Or  . acetaminophen (TYLENOL) suppository 650 mg  650 mg Rectal Q6H PRN Kandis Nab, MD      . albuterol (PROVENTIL) (5 MG/ML) 0.5% nebulizer solution 2.5 mg  2.5 mg Nebulization Q4H PRN Kandis Nab, MD      . aspirin chewable tablet 81 mg  81 mg Oral Daily Kandis Nab, MD      . atorvastatin (LIPITOR) tablet 40 mg  40 mg Oral q1800 Kandis Nab, MD      . carvedilol (COREG) tablet 12.5 mg  12.5 mg Oral BID WC Kandis Nab, MD   12.5 mg at 06/26/11 0936  . docusate sodium (COLACE) capsule 100 mg  100 mg Oral BID Kandis Nab, MD      . enoxaparin (  LOVENOX) injection 30 mg  30 mg Subcutaneous Q24H Kandis Nab, MD      . furosemide (LASIX) injection 40 mg  40 mg Intravenous Once Kandis Nab, MD   40 mg at 06/26/11 0056  . hydrALAZINE (APRESOLINE) tablet 50 mg  50 mg Oral BID Kandis Nab, MD      . insulin aspart (novoLOG) injection 0-15 Units  0-15 Units Subcutaneous TID WC Kandis Nab, MD   3 Units at 06/26/11 0902  . insulin glargine (LANTUS) injection 40 Units  40 Units Subcutaneous QHS Kandis Nab, MD      . ipratropium (ATROVENT) nebulizer solution 0.5 mg  0.5 mg Nebulization Q4H PRN Kandis Nab, MD      . loratadine (CLARITIN) tablet 10 mg  10 mg Oral Daily Kandis Nab, MD      . olopatadine (PATANOL) 0.1 % ophthalmic solution 1 drop  1 drop Both Eyes BID  Dickie La, MD   1 drop at 06/26/11 0056  . sodium chloride 0.9 % injection 3 mL  3 mL Intravenous Q12H Kandis Nab, MD   3 mL at 06/26/11 0055  . DISCONTD: aspirin tablet 81 mg  81 mg Oral Daily Kandis Nab, MD      . DISCONTD: ketotifen (ZADITOR) 0.025 % ophthalmic solution 1 drop  1 drop Both Eyes BID Kandis Nab, MD        Allergies as of 06/25/2011  . (No Known Allergies)    Family History  Problem Relation Age of Onset  . Kidney disease Mother   . COPD Father     smoke  . Cancer Father     Lung  . Hypertension Father   . Cancer Brother     prostate  . Kidney disease Sister     History   Social History  . Marital Status: Single    Spouse Name: N/A    Number of Children: 1  . Years of Education: some colle   Occupational History  . Retired- [employer]     Social History Main Topics  . Smoking status: Never Smoker   . Smokeless tobacco: Never Used  . Alcohol Use: No  . Drug Use: No  . Sexually Active: Not on file   Other Topics Concern  . Not on file   Social History Narrative   Health Care POA: Emergency Contact: daugher, [name], [phone] of Life Plan: Who lives with you: Lives with daughter- 1 story homeAny pets: noneDiet: Pt eats a variety of food but is not currently trying to limit calories or diet. Exercise: Pt does not have any regular exercise routine. Seatbelts: Pt reports wearing seatbelt when in vehicle. Nancy Fetter Exposure/Protection: Hobbies: shopping, senior center, singing    Review of Systems: Gen: Denies any fever, chills, sweats, anorexia, fatigue, weakness, malaise, weight loss, and sleep disorder HEENT: No visual complaints, No history of Retinopathy. Normal external appearance No Epistaxis or Sore throat. No sinusitis.   CV: Denies chest pain, angina, palpitations, syncope, orthopnea, PND, peripheral edema, and claudication.4+ pitting edema in both extremities up to above the knee, venous stasis present bilaterally, Resp: Denies  dyspnea at rest, dyspnea with exercise, cough, sputum, wheezing, coughing up blood, and pleurisy. GI: Denies vomiting blood, jaundice, and fecal incontinence.   Denies dysphagia or odynophagia. GU : Denies urinary burning, blood in urine, urinary frequency, urinary hesitancy, nocturnal urination, and urinary incontinence.  No renal calculi. MS: Denies joint pain, limitation of movement, and  swelling, stiffness, low back pain, extremity pain. Denies muscle weakness, cramps, atrophy.  No use of non steroidal antiinflammatory drugs. Derm: Denies rash, itching, dry skin, hives, moles, warts, or unhealing ulcers.  Psych: Denies depression, anxiety, memory loss, suicidal ideation, hallucinations, paranoia, and confusion. Heme: Denies bruising, bleeding, and enlarged lymph nodes. Neuro: No headache.  No diplopia. No dysarthria.  No dysphasia.  No history of CVA.  No Seizures. No paresthesias.  No weakness. Endocrine No DM.  No Thyroid disease.  No Adrenal disease.  Physical Exam: Vital signs in last 24 hours: Temp:  [97.5 F (36.4 C)-99.1 F (37.3 C)] 98.3 F (36.8 C) (03/09 0953) Pulse Rate:  [65-90] 72  (03/09 0953) Resp:  [15-24] 19  (03/09 0953) BP: (140-184)/(54-76) 184/72 mmHg (03/09 0953) SpO2:  [94 %-100 %] 97 % (03/09 0953) Last BM Date: 06/25/11 General:   Alert,  Well-developed, well-nourished, pleasant and cooperative in NAD Head:  Normocephalic and atraumatic. Eyes:  Sclera clear, no icterus.   Conjunctiva pink. Ears:  Normal auditory acuity. Nose:  No deformity, discharge,  or lesions. Mouth:  No deformity or lesions, dentition normal. Neck:  Supple; no masses or thyromegaly. JVP not elevated Lungs:  Clear throughout to auscultation.   No wheezes, crackles, or rhonchi. No acute distress. Heart:  Regular rate and rhythm; no murmurs, clicks, rubs,  or gallops. Abdomen:  Soft, nontender and nondistended. No masses, hepatosplenomegaly or hernias noted. Normal bowel sounds, without  guarding, and without rebound.   Msk:  Symmetrical without gross deformities. Normal posture. Pulses:  No carotid, renal, femoral bruits. DP and PT symmetrical and equal Extremities:  Without clubbing 4 + Edema Neurologic:  Alert and  oriented x4;  grossly normal neurologically. Skin:  Venous stasis  Psych:  Alert and cooperative. Normal mood and affect.  Intake/Output from previous day: 03/08 0701 - 03/09 0700 In: 580 [P.O.:480] Out: -  Intake/Output this shift: Total I/O In: 240 [P.O.:240] Out: 600 [Urine:600]  Lab Results:  Basename 06/26/11 0825 06/25/11 0800  WBC 7.2 8.3  HGB 9.0* 9.3*  HCT 28.0* 29.5*  PLT 258 272   BMET  Basename 06/26/11 0825 06/25/11 0800  NA 141 137  K 5.2* 5.1  CL 106 103  CO2 24 23  GLUCOSE 166* 203*  BUN 59* 58*  CREATININE 3.39* 3.13*  CALCIUM 8.5 8.6  PHOS -- --   LFT  Basename 06/26/11 0825  PROT 6.4  ALBUMIN 3.0*  AST 21  ALT 47*  ALKPHOS 243*  BILITOT 0.3  BILIDIR --  IBILI --   PT/INR No results found for this basename: LABPROT:2,INR:2 in the last 72 hours Hepatitis Panel No results found for this basename: HEPBSAG,HCVAB,HEPAIGM,HEPBIGM in the last 72 hours  Studies/Results: Dg Chest 2 View  06/25/2011  *RADIOLOGY REPORT*  Clinical Data: Shortness of breath.  Cough.  CHEST - 2 VIEW  Comparison: Two-view chest 06/14/2009.  Findings: Mild cardiac enlargement is present.  Mild interstitial edema is evident.  There are small bilateral pleural effusions. Minimal bibasilar airspace disease likely reflects atelectasis.  IMPRESSION:  1.  Mild cardiomegaly, edema, and small effusions are compatible with congestive heart failure.  This appears slightly worse than on the prior exam. 2.  Minimal bibasilar airspace disease likely reflects atelectasis.  Original Report Authenticated By: Resa Miner. MATTERN, M.D.   Abd 1 View (kub)  06/26/2011  *RADIOLOGY REPORT*  Clinical Data: Left-sided abdominal protrusion.  ABDOMEN - 1 VIEW   Comparison: 10/10/2008 CT  Findings: Left lateral abdominal wall  laxity and/or hernia with bowel contents predominately located lateral to the left hemiabdomen. No evidence for bowel obstruction.  Organ outlines normal where seen.  No acute osseous abnormality.  IMPRESSION: Large left lateral abdominal wall laxity / hernia, without evidence for bowel obstruction.  Original Report Authenticated By: Suanne Marker, M.D.    Assessment/Plan: 1. Chronic Kidney disease: Patient with diabetic Nephropathy and progressive renal failure Stage 4 2.Anemia Hb 9 may need ESA as outpatient 3.HTN Would increase lasix to 80 mg BID 4.Patient will need follow up with dr Moshe Cipro as an outpatient will need to set up appointment for labs in 1-2 weeks and VVS appointment for AVF    LOS: 1 Kayah Hecker W @TODAY @9 :57 AM

## 2011-06-26 NOTE — Progress Notes (Signed)
FMTS Attending Daily Note: Dorcas Mcmurray MD [phone] pager office [phone] I  have seen and examined this patient, reviewed their chart. I have discussed this patient with the resident. I agree with the resident's findings, assessment and care plan. Agree with renal consult and appreciate hteir care.

## 2011-06-27 LAB — COMPREHENSIVE METABOLIC PANEL
BUN: 61 mg/dL — ABNORMAL HIGH (ref 6–23)
CO2: 23 mEq/L (ref 19–32)
Calcium: 8.2 mg/dL — ABNORMAL LOW (ref 8.4–10.5)
Chloride: 104 mEq/L (ref 96–112)
Creatinine, Ser: 3.34 mg/dL — ABNORMAL HIGH (ref 0.50–1.10)
GFR calc Af Amer: 15 mL/min — ABNORMAL LOW (ref >90)
GFR calc non Af Amer: 13 mL/min — ABNORMAL LOW (ref >90)
Glucose, Bld: 141 mg/dL — ABNORMAL HIGH (ref 70–99)
Total Bilirubin: 0.3 mg/dL (ref 0.3–1.2)

## 2011-06-27 LAB — CBC
Hemoglobin: 8.7 g/dL — ABNORMAL LOW (ref 12.0–15.0)
Platelets: 229 10*3/uL (ref 150–400)
RBC: 2.77 MIL/uL — ABNORMAL LOW (ref 3.87–5.11)
WBC: 6.2 10*3/uL (ref 4.0–10.5)

## 2011-06-27 LAB — GLUCOSE, CAPILLARY
Glucose-Capillary: 122 mg/dL — ABNORMAL HIGH (ref 70–99)
Glucose-Capillary: 120 mg/dL — ABNORMAL HIGH (ref 70–99)
Glucose-Capillary: 145 mg/dL — ABNORMAL HIGH (ref 70–99)

## 2011-06-27 MED ORDER — FUROSEMIDE 80 MG PO TABS
80.0000 mg | ORAL_TABLET | Freq: Two times a day (BID) | ORAL | Status: DC
  Administered 2011-06-27 – 2011-06-28 (×3): 80 mg via ORAL
  Filled 2011-06-27 (×5): qty 1

## 2011-06-27 MED ORDER — FUROSEMIDE 80 MG PO TABS
80.0000 mg | ORAL_TABLET | Freq: Two times a day (BID) | ORAL | Status: DC
  Filled 2011-06-27 (×2): qty 1

## 2011-06-27 NOTE — Progress Notes (Signed)
FMTS Attending Daily Note: Dorcas Mcmurray MD [phone] pager office [phone] I  have seen and examined this patient, reviewed their chart. I have discussed this patient with the resident. I agree with the resident's findings, assessment and care plan. Improved---still not certain why she is \ becoming so hypoxic with minimal exertion. ECHO has been done and results pending.

## 2011-06-27 NOTE — Progress Notes (Signed)
Subjective: Interval History: feels about the same. Still dyspneic  Objective: Vital signs in last 24 hours:  Temp:  [98.1 F (36.7 C)-98.5 F (36.9 C)] 98.5 F (36.9 C) (03/10 0948) Pulse Rate:  [64-101] 70  (03/10 0948) Resp:  [18-20] 19  (03/10 0948) BP: (140-162)/(50-65) 146/50 mmHg (03/10 0948) SpO2:  [93 %-100 %] 100 % (03/10 0948) Weight:  [125.8 kg (277 lb 5.4 oz)] 125.8 kg (277 lb 5.4 oz) (03/09 2208)  Weight change: 0.2 kg (7.1 oz)  Intake/Output: I/O last 3 completed shifts: In: [id number] [P.O.:1440; Other:100] Out: 1050 [Urine:1050]   Intake/Output this shift:  Total I/O In: 240 [P.O.:240] Out: -   General: Alert, Well-developed, well-nourished, pleasant and cooperative in NAD  Lungs: Clear throughout to auscultation.  Heart: Regular rate and rhythm; no murmurs  Abdomen: Soft, nontender and nondistended.obese Extremities: Without clubbing 2+ edema Neurologic: Alert and oriented x4; grossly normal neurologically.  Skin: Venous stasis  Psych: Alert and cooperative. Normal mood and affect.   Lab Results:  Basename 06/26/11 0825 06/25/11 0800  WBC 7.2 8.3  HGB 9.0* 9.3*  HCT 28.0* 29.5*  PLT 258 272   BMET  Basename 06/26/11 0825 06/25/11 0800  NA 141 137  K 5.2* 5.1  CL 106 103  CO2 24 23  GLUCOSE 166* 203*  BUN 59* 58*  CREATININE 3.39* 3.13*  CALCIUM 8.5 8.6  PHOS -- --   LFT  Basename 06/26/11 0825  PROT 6.4  ALBUMIN 3.0*  AST 21  ALT 47*  ALKPHOS 243*  BILITOT 0.3  BILIDIR --  IBILI --   PT/INR No results found for this basename: LABPROT:2,INR:2 in the last 72 hours Hepatitis Panel No results found for this basename: HEPBSAG,HCVAB,HEPAIGM,HEPBIGM in the last 72 hours  Studies/Results: Abd 1 View (kub)  06/26/2011  *RADIOLOGY REPORT*  Clinical Data: Left-sided abdominal protrusion.  ABDOMEN - 1 VIEW  Comparison: 10/10/2008 CT  Findings: Left lateral abdominal wall laxity and/or hernia with bowel contents predominately located lateral  to the left hemiabdomen. No evidence for bowel obstruction.  Organ outlines normal where seen.  No acute osseous abnormality.  IMPRESSION: Large left lateral abdominal wall laxity / hernia, without evidence for bowel obstruction.  Original Report Authenticated By: Suanne Marker, M.D.    I have reviewed the patient's current medications.  Assessment/Plan: 1. Chronic Kidney disease: Patient with diabetic Nephropathy and progressive renal failure Stage 4  2.Anemia Hb 9 may need ESA as outpatient  3.HTN Would increase lasix to 80 mg BID  4.Patient will need follow up with dr Moshe Cipro as an outpatient will need to set up appointment for labs in 1-2 weeks and [facility]VS appointment for AVF  Difficult situation as dyspnea not all attributable to pulmonary edema. Chest Xray suggests edema will need to be back on Lasix 80mg  BID   LOS: 2 Aundra Pung W @TODAY @10 :12 AM

## 2011-06-27 NOTE — Progress Notes (Signed)
Subjective: Pt reports dyspnea slightly improved.  Still present with minimal exertion.    Objective: Vital signs in last 24 hours: Temp:  [98.1 F (36.7 C)-98.5 F (36.9 C)] 98.5 F (36.9 C) (03/10 0948) Pulse Rate:  [64-101] 70  (03/10 0948) Resp:  [18-20] 19  (03/10 0948) BP: (140-162)/(50-65) 146/50 mmHg (03/10 0948) SpO2:  [93 %-100 %] 100 % (03/10 0948) Weight:  [277 lb 5.4 oz (125.8 kg)] 277 lb 5.4 oz (125.8 kg) (03/09 2208) Weight change: 7.1 oz (0.2 kg) Last BM Date: 06/26/11  Intake/Output from previous day: 03/09 0701 - 03/10 0700 In: 52 [P.O.:960] Out: 1050 [Urine:1050] Intake/Output this shift: Total I/O In: 240 [P.O.:240] Out: -   PE: GENERAL:  An elderly, obese, African American female, examined him and CH 6700.  Alert and appropriate.  In no acute discomfort, no respiratory distress.  No conversational dyspnea HNEENT: AT/, MMM, no scleral icterus, EOMi THORAX: HEART: Distant heart sounds due to body habitus. no murmur appreciated LUNGS: CTA B, no wheezes, faint crackles in B bases; resolves with coughing. ABDOMEN: +BS, soft, non-tender, no rigidity, no guarding, large ventral hernia noted EXTREMITIES: 4+ bilateral pitting edema chronic venous stasis changes, one out of 4 distal pulses.  Lab Results:  Basename 06/27/11 1000 06/26/11 0825  WBC 6.2 7.2  HGB 8.7* 9.0*  HCT 27.3* 28.0*  PLT 229 258   BMET  Basename 06/26/11 0825 06/25/11 0800  NA 141 137  K 5.2* 5.1  CL 106 103  CO2 24 23  GLUCOSE 166* 203*  BUN 59* 58*  CREATININE 3.39* 3.13*  CALCIUM 8.5 8.6    Studies/Results: Abd 1 View (kub)  06/26/2011  *RADIOLOGY REPORT*  Clinical Data: Left-sided abdominal protrusion.  ABDOMEN - 1 VIEW  Comparison: 10/10/2008 CT  Findings: Left lateral abdominal wall laxity and/or hernia with bowel contents predominately located lateral to the left hemiabdomen. No evidence for bowel obstruction.  Organ outlines normal where seen.  No acute osseous  abnormality.  IMPRESSION: Large left lateral abdominal wall laxity / hernia, without evidence for bowel obstruction.  Original Report Authenticated By: Suanne Marker, M.D.    Medications: I have reviewed the patient's current medications.  Assessment/Plan: 71 year old female admitted for worsening dyspnea. Questionable cardiac versus pulmonary component versus simply obesity hypoventilation:  1. RESP (Dyspnea, Hypoxia) *DuoNebs prn - Dyspnea, slightly subjectively improved, no hypoxia. - Will wean oxygen and obtain a ambulatory pulse ox  - ProBNP 700, question if this is potential cardiac special in setting of profound lower extremity edema.  2-D Echo has been completed however, is currently pending read; likely contribute factor from renal disease.  2. RENAL (Acute on Chronic Kidney Disease[diabetic nephropathy], Hyperkalemia) *Lasix 80 mg by mouth twice a day starting today - Renal suggesting followup as outpatient, as well as followup with vascular surgery for placement of AV fistula. - Question utility of ACE inhibitor at this time-  - AM labs pending  3. MSK/Extremities (Abdominal Wall Hernia, LE Edema) No intervention planned at this time, see above for further plan  4. CV (HTN) *Lasix as above *Carvedilol *ASA - 2-D echo pending -Amlodipine held for lower severe edema  5. HEME (Normocytic Anemia) - Renal to further work this up as outpatient, likely anemia of chronic disease - A.m. labs pending  6. IMMUNE (seasonal allergies) *Loratadine  7. ENDO/MET (DM2, HLD) *Lantus 40 units subcutaneous each bedtime *Sliding scale insulin *Lipitor 40  8. PPX: Lovenox 30 due to creatinine clearance   9. FEN/GI: CMET  pending  Dispo: If improving respiratory, status, we'll consider discharge later today.  Continue Lasix for volume overload.   LOS: 2 days   Rachel Diss, DO [facility] Resident - PGY-1 06/27/2011 11:05 AM

## 2011-06-27 NOTE — Progress Notes (Addendum)
Patient was ambulated to nursing station and back to her room. Oxygen sats were monitored and patient sats ranged from 94-100% on room air. Patient also instructed on incentive spirometer and demonstrated how to use accurately today.

## 2011-06-28 ENCOUNTER — Ambulatory Visit: Payer: Medicare Other | Admitting: Family Medicine

## 2011-06-28 ENCOUNTER — Other Ambulatory Visit: Payer: Self-pay | Admitting: Family Medicine

## 2011-06-28 ENCOUNTER — Other Ambulatory Visit: Payer: Self-pay

## 2011-06-28 DIAGNOSIS — I5030 Unspecified diastolic (congestive) heart failure: Secondary | ICD-10-CM | POA: Diagnosis present

## 2011-06-28 LAB — GLUCOSE, CAPILLARY
Glucose-Capillary: 48 mg/dL — ABNORMAL LOW (ref 70–99)
Glucose-Capillary: 134 mg/dL — ABNORMAL HIGH (ref 70–99)
Glucose-Capillary: 118 mg/dL — ABNORMAL HIGH (ref 70–99)

## 2011-06-28 LAB — RENAL FUNCTION PANEL
Albumin: 3 g/dL — ABNORMAL LOW (ref 3.5–5.2)
BUN: 65 mg/dL — ABNORMAL HIGH (ref 6–23)
CO2: 24 mEq/L (ref 19–32)
Chloride: 102 mEq/L (ref 96–112)
Creatinine, Ser: 3.24 mg/dL — ABNORMAL HIGH (ref 0.50–1.10)
GFR calc non Af Amer: 13 mL/min — ABNORMAL LOW (ref >90)
Potassium: 4.9 mEq/L (ref 3.5–5.1)

## 2011-06-28 LAB — IRON AND TIBC
Saturation Ratios: 12 % — ABNORMAL LOW (ref 20–55)
UIBC: 214 ug/dL (ref 125–400)

## 2011-06-28 MED ORDER — FUROSEMIDE 80 MG PO TABS: ORAL_TABLET | ORAL | Status: DC

## 2011-06-28 MED ORDER — AMLODIPINE BESYLATE 10 MG PO TABS: 10.0000 mg | ORAL_TABLET | Freq: Every day | ORAL | Status: DC

## 2011-06-28 MED ORDER — GLIPIZIDE 10 MG PO TABS: 10.0000 mg | ORAL_TABLET | Freq: Two times a day (BID) | ORAL | Status: DC

## 2011-06-28 MED ORDER — DARBEPOETIN ALFA-POLYSORBATE 100 MCG/0.5ML IJ SOLN
100.0000 ug | Freq: Once | INTRAMUSCULAR | Status: AC
  Administered 2011-06-28: 100 ug via SUBCUTANEOUS
  Filled 2011-06-28: qty 0.5

## 2011-06-28 NOTE — Progress Notes (Signed)
Pt converted from NSR to 1st degree heart block on telemetry. Rachel Hobbs. notified, who ordered a 12 lead EKG. EKG results will be placed in chart upon completion

## 2011-06-28 NOTE — Clinical Documentation Improvement (Signed)
CHF DOCUMENTATION CLARIFICATION QUERY  THIS DOCUMENT IS NOT A PERMANENT PART OF THE MEDICAL RECORD  TO RESPOND TO THE THIS QUERY, FOLLOW THE INSTRUCTIONS BELOW:  1. If needed, update documentation for the patient's encounter via the notes activity.  2. Access this query again and click edit on the In [employer].  3. After updating, or not, click F2 to complete all highlighted (required) fields concerning your review. Select "additional documentation in the medical record" OR "no additional documentation provided".  4. Click Sign note button.  5. The deficiency will fall out of your In Basket *Please let us know if you are not able to complete this workflow by phone or e-mail (listed below).  Please update your documentation within the medical record to reflect your response to this query.                                                                                    06/28/11  Dear Dr. Nori Riis / Associates,  In a better effort to capture your patient's severity of illness, reflect appropriate length of stay and utilization of resources, a review of the patient medical record has revealed the following indicators the diagnosis of Heart Failure.    Based on your clinical judgment, please clarify and document in a progress note and/or discharge summary the clinical condition associated with the following supporting information:  In responding to this query please exercise your independent judgment.  The fact that a query is asked, does not imply that any particular answer is desired or expected.  Possible Clinical Conditions?  Acute Systolic Congestive Heart Failure Acute Diastolic Congestive Heart Failure Acute Systolic & Diastolic Congestive Heart Failure Acute on Chronic Systolic Congestive Heart Failure Acute on Chronic Diastolic Congestive Heart Failure Acute on Chronic Systolic & Diastolic  Congestive Heart Failure Chronic Systolic Congestive Heart Failure Chronic Diastolic  Congestive Heart Failure Chronic Systolic & Diastolic Congestive Heart Failure Other Condition________________________________________ Cannot Clinically Determine   Supporting Information:   Risk Factors:(As per notes) "ckd- stage 4" Signs & Symptoms: (As per notes) "Differential for her fluid overload includes CHF vs renal failure."  "She does have mild crackles on exam"  "LE edema: Likely combination of CHF, renal failure, and venous stasis. She does not tolerate compression stockings well."  Diagnostics: LABS: 10-25-11 Pro B Natriuretic peptide (BNP)  731.5 (H)  ECHO: Study Conclusions Left ventricle: The cavity size was normal. Wall thickness was increased in a pattern of mild LVH. There was mild concentric hypertrophy. Systolic function was normal. The estimated ejection fraction was in the range of 55% to 60%. Features are consistent with a pseudonormal left ventricular filling pattern, with concomitant abnormal relaxation and increased filling pressure (grade 2 diastolic dysfunction). Doppler parameters are consistent with both elevated ventricular end-diastolic filling pressure and elevated left atrial filling pressure.    Transthoracic echocardiography.  M-mode, complete 2D, spectral Doppler, and color Doppler.  Height:  Height: 157.5cm. Height: 62in. Weight:  Weight: 125kg. Weight: 275lb.  Body mass index: BMI: 50.4kg/m^2.  Body surface area:    BSA: 2.72m^2.  Blood pressure:     184/72.  Patient status:  Inpatient. Location:  Bedside  Treatment: (  As per notes)  LASIX 80 MG PO BID  Reviewed: additional documentation in the medical record  Thank Dennis Bast  Wallie Char Delk RN,BSN  Clinical Documentation Specialist: [phone] Pager [place]

## 2011-06-28 NOTE — Progress Notes (Signed)
Discussed discharge instructions with pt. Pt showed no barriers to learning. IV and telemetry removed. Assessment unchanged from morning. Pt discharged to home with friend.

## 2011-06-28 NOTE — ED Provider Notes (Signed)
Medical screening examination/treatment/procedure(s) were performed by non-physician practitioner and as supervising physician I was immediately available for consultation/collaboration.  Jasper Riling. Alvino Chapel, MD 06/28/11 2018

## 2011-06-28 NOTE — Progress Notes (Signed)
CBG: 48  Treatment: 15 GM carbohydrate snack  Symptoms: None  Follow-up CBG: Time: 8:27 CBG Result: 62 (after drinking two apple juice drinks) Follow-up CBG x 2: Time: 9:38      CBG Result: 134 (after eating breakfast)  Possible Reasons for Event: Medication regimen: too much long lasting insulin  Comments/MD notified: Rachel Hobbs.O. notified   SCHAUER, Rachel Hobbs

## 2011-06-28 NOTE — Progress Notes (Signed)
S:breathing better  Appetite good O:BP 176/71  Pulse 76  Temp(Src) 98.7 F (37.1 C) (Oral)  Resp 18  Ht 5\' 2"  (1.575 m)  Wt 126.1 kg (278 lb)  BMI 50.85 kg/m2  SpO2 94%  Intake/Output Summary (Last 24 hours) at 06/28/11 1458 Last data filed at 06/28/11 1000  Gross per 24 hour  Intake    240 ml  Output   1750 ml  Net  -1510 ml   Weight change: 0.3 kg (10.6 oz) [id number] and alert CVS:RRR Resp:Clear Abd:+BS NTND [id number] edema NEURO:CNI OX3 no asterixis      . aspirin  81 mg Oral Daily  . atorvastatin  40 mg Oral q1800  . carvedilol  12.5 mg Oral BID WC  . docusate sodium  100 mg Oral BID  . enoxaparin  30 mg Subcutaneous Q24H  . furosemide  80 mg Oral BID  . hydrALAZINE  50 mg Oral BID  . insulin aspart  0-15 Units Subcutaneous TID WC  . insulin glargine  40 Units Subcutaneous QHS  . loratadine  10 mg Oral Daily  . olopatadine  1 drop Both Eyes BID  . sodium chloride  3 mL Intravenous Q12H   No results found. BMET    Component Value Date/Time   NA 138 06/28/2011 0950   K 4.9 06/28/2011 0950   CL 102 06/28/2011 0950   CO2 24 06/28/2011 0950   GLUCOSE 113* 06/28/2011 0950   BUN 65* 06/28/2011 0950   CREATININE 3.24* 06/28/2011 0950   CREATININE 2.92* 11/04/2010 0926   CALCIUM 8.7 06/28/2011 0950   CALCIUM 8.5 09/26/2009 2251   GFRNONAA 13* 06/28/2011 0950   GFRAA 16* 06/28/2011 0950   CBC    Component Value Date/Time   WBC 6.2 06/27/2011 1000   RBC 2.77* 06/27/2011 1000   HGB 8.7* 06/27/2011 1000   HCT 27.3* 06/27/2011 1000   PLT 229 06/27/2011 1000   MCV 98.6 06/27/2011 1000   MCH 31.4 06/27/2011 1000   MCHC 31.9 06/27/2011 1000   RDW 13.3 06/27/2011 1000   LYMPHSABS 1.0 06/25/2011 0800   MONOABS 0.5 06/25/2011 0800   EOSABS 0.1 06/25/2011 0800   BASOSABS 0.0 06/25/2011 0800     Assessment:  1. CKD 4 stable 2. SOB improved 3. Anemia    Plan: 1. Lasix 160mg  am 80mg  pm 2. Start aranesp 17mcg q 2 weeks 3. Note plans for dc.  She should fu with Dr  Hinton Dyer T

## 2011-07-01 ENCOUNTER — Ambulatory Visit: Payer: Medicare Other

## 2011-07-01 ENCOUNTER — Ambulatory Visit (INDEPENDENT_AMBULATORY_CARE_PROVIDER_SITE_OTHER): Payer: Medicare Other | Admitting: Family Medicine

## 2011-07-01 VITALS — BP 140/62 | HR 72 | Temp 98.2°F | Ht 62.0 in | Wt 266.0 lb

## 2011-07-01 DIAGNOSIS — I10 Essential (primary) hypertension: Secondary | ICD-10-CM

## 2011-07-01 DIAGNOSIS — I5030 Unspecified diastolic (congestive) heart failure: Secondary | ICD-10-CM

## 2011-07-01 DIAGNOSIS — D509 Iron deficiency anemia, unspecified: Secondary | ICD-10-CM

## 2011-07-01 DIAGNOSIS — N186 End stage renal disease: Secondary | ICD-10-CM

## 2011-07-01 LAB — CBC
HCT: 31.8 % — ABNORMAL LOW (ref 36.0–46.0)
Hemoglobin: 9.9 g/dL — ABNORMAL LOW (ref 12.0–15.0)
MCH: 30.9 pg (ref 26.0–34.0)
MCHC: 31.1 g/dL (ref 30.0–36.0)
MCV: 99.4 fL (ref 78.0–100.0)

## 2011-07-01 LAB — BASIC METABOLIC PANEL
BUN: 63 mg/dL — ABNORMAL HIGH (ref 6–23)
Calcium: 8.7 mg/dL (ref 8.4–10.5)
Glucose, Bld: 157 mg/dL — ABNORMAL HIGH (ref 70–99)
Potassium: 4.8 mEq/L (ref 3.5–5.3)

## 2011-07-01 NOTE — Assessment & Plan Note (Signed)
Pt to follow with Nephrology to further discuss HD preparation.  Ace inhibitor discontinued.  Will check BMET today as she is on high dose Lasix.

## 2011-07-01 NOTE — Assessment & Plan Note (Signed)
Improving with diuresis.  Will continue current dosage of Lasix for now as she still has some dyspnea and LE edema.  Discussed monitoring her weight, she voices understanding.

## 2011-07-01 NOTE — Progress Notes (Signed)
  Subjective:    Patient ID: Rachel Hobbs, female    DOB: 10-11-1940, 71 y.o.   MRN: [id number]  HPI  Rachel Hobbs comes in for hospital follow up.  She was hospitalized with congestive heart failure exacerbation, acute on chronic renal failure, and also has anemia of CKD.    CHF- taking Lasix 160 po in am, 80 po in pm.  She says she is feeling much better, but still feels short of breath from time to time when she is exerting herself.  She denies chest pain.  She says the swelling in her legs has gone down a lot, but now her skin is very dry and scaly, no open sores.   HTN- pt had poor control in hospital.  Acei d/c'd due to CKD IV.  Taking Norvasc and Coreg.  No dizziness, headaches, palpitations.    DM II- A1c 7.5 in hospital, fasting blood sugars 90's since discharge.   Anemia- aranesp started during hospitalization.    CKD IV- patient following with Rachel Hobbs 3/20.  She understands she will likely need dialysis eventually.   Review of Systems Pertinent items in HPI.     Objective:   Physical Exam BP 140/62  Pulse 72  Temp(Src) 98.2 F (36.8 C) (Oral)  Ht 5\' 2"  (1.575 m)  Wt 266 lb (120.657 kg)  BMI 48.65 kg/m2 General appearance: alert, cooperative and no distress Head: Normocephalic, without obvious abnormality, atraumatic Neck: no adenopathy, no carotid bruit, no JVD, supple, symmetrical, trachea midline and thyroid not enlarged, symmetric, no tenderness/mass/nodules Lungs: clear to auscultation bilaterally Heart: regular rate and rhythm, S1, S2 normal, no murmur, click, rub or gallop Abdomen: soft, non-tender; bowel sounds normal; no masses,  no organomegaly Extremities: trace LE edema, chronic venous stasis changed of LE.  Pulses: 2+ and symmetric Neurologic: Grossly normal       Assessment & Plan:

## 2011-07-01 NOTE — Assessment & Plan Note (Signed)
Will check CBC today, Aranesp per Nephrology.

## 2011-07-01 NOTE — Patient Instructions (Signed)
It was good to see you.  I am glad you are doing better.  Your weight today is 266 lbs.  Please keep a close eye on your weight- if you start gaining too much weight or your shortness of breath is getting worse, please call the office for an appointment.   Your blood pressure today was BP: 140/62 mmHg.  Remember your goal blood pressure is about 120/80.  Please be sure to take your medication every day.  Your medications for your blood pressure right now are Amlodipine, or Norvasc, and Carvedilol, or Coreg.  Please speak with Dr. Clover Mealy about your blood pressure.  I think you might benefit from a third blood pressure medication- probably HCTZ, if she is [place] with that medicine.  During your hospitalization Accupril was discontinued.    I am going to check some labs today to monitor your kidney function, potassium, and blood counts.  I will send you a letter with the results.

## 2011-07-01 NOTE — Assessment & Plan Note (Addendum)
Not at goal, Ace inhibitor d/c'd due to end stage renal disease.  Discussed with patient possibility of adding HCTZ, she would like to discuss this with Dr. Moshe Cipro before making any changes. I think this is reasonable, and I would also like to see what her potassium is on Lasix before adding HCTZ.

## 2011-07-06 ENCOUNTER — Telehealth: Payer: Self-pay | Admitting: Family Medicine

## 2011-07-06 ENCOUNTER — Encounter: Payer: Self-pay | Admitting: Family Medicine

## 2011-07-06 NOTE — Telephone Encounter (Signed)
Patient is calling for a note for [employer], she says she thinks it is next month.  Please give her a call when it is ready.

## 2011-07-06 NOTE — Telephone Encounter (Signed)
I need the dates and her juror number.  Thanks.

## 2011-07-07 NOTE — Telephone Encounter (Signed)
Date for Madaline Savage duty is - 4/23 Jury # [phone]

## 2011-07-07 NOTE — Telephone Encounter (Signed)
Letter written, Blue team notified, I will be there tomorrow if it needs to be signed.

## 2011-07-08 ENCOUNTER — Other Ambulatory Visit: Payer: Self-pay | Admitting: Family Medicine

## 2011-07-08 ENCOUNTER — Telehealth: Payer: Self-pay | Admitting: Family Medicine

## 2011-07-08 DIAGNOSIS — E119 Type 2 diabetes mellitus without complications: Secondary | ICD-10-CM

## 2011-07-08 MED ORDER — GLIPIZIDE 10 MG PO TABS: 10.0000 mg | ORAL_TABLET | Freq: Two times a day (BID) | ORAL | Status: DC

## 2011-07-08 NOTE — Telephone Encounter (Signed)
Pt advised letter ready, pt requests it to be mailed. Done.Rachel Hobbs

## 2011-07-08 NOTE — Discharge Summary (Signed)
I interviewed and examined this patient and discussed the care plan with Dr. Paulla Fore and the [facility] team and agree with assessment and plan as documented in the discharge note for today.    Butler A. Walker Kehr, [place] [facility] Attending  07/08/2011 6:32 PM

## 2011-07-09 ENCOUNTER — Encounter: Payer: Self-pay | Admitting: Surgery

## 2011-07-12 ENCOUNTER — Encounter: Payer: Self-pay | Admitting: Family Medicine

## 2011-07-12 ENCOUNTER — Encounter: Payer: Self-pay | Admitting: Surgery

## 2011-07-12 ENCOUNTER — Ambulatory Visit (INDEPENDENT_AMBULATORY_CARE_PROVIDER_SITE_OTHER): Payer: Medicare Other | Admitting: Surgery

## 2011-07-12 ENCOUNTER — Ambulatory Visit (INDEPENDENT_AMBULATORY_CARE_PROVIDER_SITE_OTHER): Payer: Medicare Other | Admitting: *Deleted

## 2011-07-12 VITALS — BP 177/62 | HR 69 | Resp 16 | Ht 62.0 in | Wt 268.0 lb

## 2011-07-12 DIAGNOSIS — N184 Chronic kidney disease, stage 4 (severe): Secondary | ICD-10-CM

## 2011-07-12 DIAGNOSIS — Z01810 Encounter for preprocedural cardiovascular examination: Secondary | ICD-10-CM

## 2011-07-12 DIAGNOSIS — N186 End stage renal disease: Secondary | ICD-10-CM

## 2011-07-12 NOTE — Progress Notes (Signed)
Vascular and Vein Specialist of [facility]   Patient name: Rachel Hobbs MRN: [id number] DOB: 03/10/1941 Sex: female     Chief Complaint  Patient presents with  . ESRD    Vein mapping and Dr. visit    HISTORY OF PRESENT ILLNESS: The patient is back today for further discussions of dialysis. The last time she was here she was ready to make a decision to proceed with access. In the interval since I last saw her she is been admitted the hospital for shortness of breath which did resolve. She is right-handed. She also got vein mapping today. She is medically managed for diabetes hypertension and hyper cholesterol. She is morbidly obese.  Past Medical History  Diagnosis Date  . Depression   . Hyperlipidemia   . Hypertension   . Diabetes mellitus     Past Surgical History  Procedure Date  . Hernia repair   . Abdominal hysterectomy     History   Social History  . Marital Status: Single    Spouse Name: N/A    Number of Children: 1  . Years of Education: some colle   Occupational History  . Retired- [employer]     Social History Main Topics  . Smoking status: Never Smoker   . Smokeless tobacco: Never Used  . Alcohol Use: No  . Drug Use: No  . Sexually Active: Not on file   Other Topics Concern  . Not on file   Social History Narrative   Health Care POA: Emergency Contact: daugher, [name], [phone] of Life Plan: Who lives with you: Lives with daughter- 1 story homeAny pets: noneDiet: Pt eats a variety of food but is not currently trying to limit calories or diet. Exercise: Pt does not have any regular exercise routine. Seatbelts: Pt reports wearing seatbelt when in vehicle. Nancy Fetter Exposure/Protection: Hobbies: shopping, senior center, singing    Family History  Problem Relation Age of Onset  . Kidney disease Mother   . COPD Father     smoke  . Cancer Father     Lung  . Hypertension Father   . Cancer Brother     prostate  . Kidney disease Sister     Allergies as of  07/12/2011  . (No Known Allergies)    Current Outpatient Prescriptions on File Prior to Visit  Medication Sig Dispense Refill  . amLODipine (NORVASC) 10 MG tablet Take 1 tablet (10 mg total) by mouth daily.  30 tablet  5  . aspirin 81 MG tablet Take 81 mg by mouth daily.        Marland Kitchen atorvastatin (LIPITOR) 40 MG tablet Take 1 tablet (40 mg total) by mouth daily. For cholesterol.  90 tablet  2  . carvedilol (COREG) 12.5 MG tablet Take 1 tablet (12.5 mg total) by mouth 2 (two) times daily with a meal.  60 tablet  5  . cetirizine (ZYRTEC) 10 MG tablet Take 10 mg by mouth daily. For allergies.       Marland Kitchen docusate sodium (COLACE) 100 MG capsule Take 100 mg by mouth 2 (two) times daily.        . furosemide (LASIX) 80 MG tablet Take 160 mg (2 tablets) in the morning and 80 mg (1 tablet) in the afternoon.  90 tablet  0  . glipiZIDE (GLUCOTROL) 10 MG tablet Take 1 tablet (10 mg total) by mouth 2 (two) times daily before a meal.  120 tablet  3  . hydrALAZINE (APRESOLINE) 50 MG tablet Take 50  mg by mouth 2 (two) times daily.      Marland Kitchen ketotifen (ZADITOR) 0.025 % ophthalmic solution Place 1 drop into both eyes 2 (two) times daily.       Marland Kitchen LANTUS SOLOSTAR 100 UNIT/ML injection INJECT 40 UNITS SUBCUTANEOSLY EACH EVENING  45 mL  0  . Multiple Vitamins-Minerals (ONE DAILY WOMENS) TABS Take by mouth daily.           REVIEW OF SYSTEMS: Review of systems all negative except O. Shortness of breath.  PHYSICAL EXAMINATION:   Vital signs are BP 177/62  Pulse 69  Resp 16  Ht 5\' 2"  (1.575 m)  Wt 268 lb (121.564 kg)  BMI 49.02 kg/m2  SpO2 100% General: The patient appears their stated age. HEENT:  No gross abnormalities Pulmonary:  Non labored breathing Musculoskeletal: There are no major deformities. Neurologic: No focal weakness or paresthesias are detected, Skin: There are no ulcer or rashes noted. Psychiatric: The patient has normal affect. Cardiovascular: Regular rate and rhythm. Palpable left brachial and  radial pulse   Diagnostic Studies Vein mapping today shows very small caliber cephalic and basilic vein bilaterally  Assessment: Chronic renal insufficiency, stage IV Plan: I feel the most appropriate access based on her vein mapping is going to be a left forearm graft. I did discuss the risks and benefits of this with the patient including the risk of infection the risk of future interventions. My plan would be to proceed with left forearm graft in left at the time of her operation ultrasound reveals her cephalic or basilic vein to be adequate. I will discuss with Dr. Hillery Hunter the timing with which we need to proceed with a graft placement. I do not know the patient's expected time to start dialysis and therefore would like to wait as long as possible before proceeding with a graft.  Eldridge Abrahams, M.D. [facility] [place] Office: [phone] Pager:  [phone]

## 2011-07-13 ENCOUNTER — Encounter: Payer: Self-pay | Admitting: Family Medicine

## 2011-07-13 ENCOUNTER — Telehealth: Payer: Self-pay | Admitting: *Deleted

## 2011-07-13 NOTE — Telephone Encounter (Signed)
Rachel Hobbs, Dr Joyce Copa RN, said it was okay to wait to do insertion of arteriovenous graft per Dr Moshe Cipro after reading Dr Stephens Shire note

## 2011-07-15 ENCOUNTER — Encounter (HOSPITAL_COMMUNITY)
Admission: RE | Admit: 2011-07-15 | Discharge: 2011-07-15 | Disposition: A | Discharge status: Home / Self Care | Payer: Medicare Other | Source: Ambulatory Visit | Attending: Nephrology | Admitting: Nephrology

## 2011-07-15 DIAGNOSIS — D509 Iron deficiency anemia, unspecified: Secondary | ICD-10-CM | POA: Insufficient documentation

## 2011-07-15 MED ORDER — SODIUM CHLORIDE 0.9 % IV SOLN
INTRAVENOUS | Status: DC
  Administered 2011-07-15: 13:00:00 via INTRAVENOUS

## 2011-07-15 MED ORDER — FERUMOXYTOL INJECTION 510 MG/17 ML: 510.0000 mg | INTRAVENOUS | Status: DC

## 2011-07-15 MED ORDER — FERUMOXYTOL INJECTION 510 MG/17 ML
INTRAVENOUS | Status: AC
  Administered 2011-07-15: 510 mg via INTRAVENOUS
  Filled 2011-07-15: qty 17

## 2011-07-22 ENCOUNTER — Encounter (HOSPITAL_COMMUNITY)
Admission: RE | Admit: 2011-07-22 | Discharge: 2011-07-22 | Disposition: A | Discharge status: Home / Self Care | Payer: Medicare Other | Source: Ambulatory Visit | Attending: Nephrology | Admitting: Nephrology

## 2011-07-22 DIAGNOSIS — D509 Iron deficiency anemia, unspecified: Secondary | ICD-10-CM | POA: Insufficient documentation

## 2011-07-22 MED ORDER — SODIUM CHLORIDE 0.9 % IV SOLN
INTRAVENOUS | Status: DC
  Administered 2011-07-22: 11:00:00 via INTRAVENOUS

## 2011-07-22 MED ORDER — FERUMOXYTOL INJECTION 510 MG/17 ML
510.0000 mg | INTRAVENOUS | Status: AC
  Administered 2011-07-22: 510 mg via INTRAVENOUS
  Filled 2011-07-22: qty 17

## 2011-07-22 NOTE — Progress Notes (Signed)
Attempted 3 PIV's by two nurses without success. Paged IV team

## 2011-07-26 NOTE — Procedures (Unsigned)
CEPHALIC VEIN MAPPING  INDICATION:  Preop vein mapping for dialysis access.  HISTORY: Chronic kidney disease stage 4.  EXAM:  The right cephalic vein is compressible.  Diameter measurements range from 0.28 to 0.14 cm.  The right basilic vein is compressible.  Diameter measurements range from 0.38 to 0.19 cm.  The left cephalic vein is compressible.  Diameter measurements range from 0.29 to 0.13 cm.  The left basilic vein is compressible.  Diameter measurements range from 0.34 to 0.2 cm.  See attached worksheet for all measurements.  IMPRESSION: 1. Patent bilateral cephalic and basilic veins with diameter     measurements as described above. 2. Of note, the right brachial artery has a high bifurcation in the     upper arm.  ___________________________________________ V. Leia Alf, MD  LT/MEDQ  D:  07/12/2011  T:  07/12/2011  Job:  [id number]

## 2011-07-29 ENCOUNTER — Other Ambulatory Visit: Payer: Self-pay | Admitting: Family Medicine

## 2011-07-29 MED ORDER — INSULIN GLARGINE 100 UNIT/ML ~~LOC~~ SOLN: SUBCUTANEOUS | Status: DC

## 2011-09-02 ENCOUNTER — Other Ambulatory Visit: Payer: Self-pay | Admitting: Family Medicine

## 2011-09-02 NOTE — Telephone Encounter (Signed)
Patient is calling for a refill on her Carvedilol to be sent to [facility] on [address].  Patient said that she called [facility] first, and that they have said that the refill request was sent.

## 2011-09-03 MED ORDER — CARVEDILOL 12.5 MG PO TABS: 12.5000 mg | ORAL_TABLET | Freq: Two times a day (BID) | ORAL | Status: DC

## 2011-09-03 NOTE — Telephone Encounter (Signed)
Patient is calling back again about her refill on her Carvedilol to go to [facility] on [address].

## 2011-09-06 ENCOUNTER — Other Ambulatory Visit: Payer: Self-pay | Admitting: Family Medicine

## 2011-09-06 MED ORDER — CARVEDILOL 12.5 MG PO TABS: 12.5000 mg | ORAL_TABLET | Freq: Two times a day (BID) | ORAL | Status: DC

## 2011-10-15 ENCOUNTER — Encounter: Payer: Self-pay | Admitting: Home Health Services

## 2011-10-19 ENCOUNTER — Other Ambulatory Visit: Payer: Self-pay | Admitting: *Deleted

## 2011-10-19 DIAGNOSIS — I10 Essential (primary) hypertension: Secondary | ICD-10-CM

## 2011-10-19 MED ORDER — AMLODIPINE BESYLATE 10 MG PO TABS: 10.0000 mg | ORAL_TABLET | Freq: Every day | ORAL | Status: DC

## 2011-11-19 ENCOUNTER — Other Ambulatory Visit: Payer: Self-pay | Admitting: Family Medicine

## 2011-11-19 DIAGNOSIS — Z1231 Encounter for screening mammogram for malignant neoplasm of breast: Secondary | ICD-10-CM

## 2011-12-10 ENCOUNTER — Encounter (HOSPITAL_COMMUNITY)
Admission: RE | Admit: 2011-12-10 | Discharge: 2011-12-10 | Disposition: A | Discharge status: Home / Self Care | Payer: Medicare Other | Source: Ambulatory Visit | Attending: Nephrology | Admitting: Nephrology

## 2011-12-10 DIAGNOSIS — N184 Chronic kidney disease, stage 4 (severe): Secondary | ICD-10-CM | POA: Insufficient documentation

## 2011-12-10 DIAGNOSIS — D638 Anemia in other chronic diseases classified elsewhere: Secondary | ICD-10-CM | POA: Insufficient documentation

## 2011-12-10 LAB — POCT HEMOGLOBIN-HEMACUE: Hemoglobin: 10 g/dL — ABNORMAL LOW (ref 12.0–15.0)

## 2011-12-10 MED ORDER — EPOETIN ALFA 20000 UNIT/ML IJ SOLN: 20000.0000 [IU] | INTRAMUSCULAR | Status: DC

## 2011-12-10 MED ORDER — EPOETIN ALFA 20000 UNIT/ML IJ SOLN
INTRAMUSCULAR | Status: AC
  Administered 2011-12-10: 20000 [IU] via SUBCUTANEOUS
  Filled 2011-12-10: qty 1

## 2011-12-13 ENCOUNTER — Ambulatory Visit (HOSPITAL_COMMUNITY)
Admission: RE | Admit: 2011-12-13 | Discharge: 2011-12-13 | Disposition: A | Discharge status: Home / Self Care | Payer: Medicare Other | Source: Ambulatory Visit | Attending: Family Medicine | Admitting: Family Medicine

## 2011-12-13 DIAGNOSIS — Z1231 Encounter for screening mammogram for malignant neoplasm of breast: Secondary | ICD-10-CM | POA: Insufficient documentation

## 2011-12-22 ENCOUNTER — Ambulatory Visit (INDEPENDENT_AMBULATORY_CARE_PROVIDER_SITE_OTHER): Payer: Medicare Other | Admitting: Family Medicine

## 2011-12-22 ENCOUNTER — Encounter: Payer: Self-pay | Admitting: Family Medicine

## 2011-12-22 VITALS — BP 176/77 | HR 76 | Temp 97.8°F | Ht 62.0 in | Wt 270.0 lb

## 2011-12-22 DIAGNOSIS — E119 Type 2 diabetes mellitus without complications: Secondary | ICD-10-CM

## 2011-12-22 DIAGNOSIS — N186 End stage renal disease: Secondary | ICD-10-CM

## 2011-12-22 DIAGNOSIS — I10 Essential (primary) hypertension: Secondary | ICD-10-CM

## 2011-12-22 DIAGNOSIS — E785 Hyperlipidemia, unspecified: Secondary | ICD-10-CM

## 2011-12-22 MED ORDER — INSULIN GLARGINE 100 UNIT/ML ~~LOC~~ SOLN: SUBCUTANEOUS | Status: DC

## 2011-12-22 MED ORDER — HYDRALAZINE HCL 50 MG PO TABS: 50.0000 mg | ORAL_TABLET | Freq: Three times a day (TID) | ORAL | Status: DC

## 2011-12-22 MED ORDER — FUROSEMIDE 80 MG PO TABS: ORAL_TABLET | ORAL | Status: DC

## 2011-12-22 MED ORDER — GLIPIZIDE 10 MG PO TABS: 10.0000 mg | ORAL_TABLET | Freq: Two times a day (BID) | ORAL | Status: DC

## 2011-12-22 MED ORDER — ATORVASTATIN CALCIUM 40 MG PO TABS: 40.0000 mg | ORAL_TABLET | Freq: Every day | ORAL | Status: DC

## 2011-12-22 MED ORDER — CARVEDILOL 12.5 MG PO TABS: 12.5000 mg | ORAL_TABLET | Freq: Two times a day (BID) | ORAL | Status: DC

## 2011-12-22 MED ORDER — AMLODIPINE BESYLATE 10 MG PO TABS: 10.0000 mg | ORAL_TABLET | Freq: Every day | ORAL | Status: DC

## 2011-12-22 NOTE — Assessment & Plan Note (Signed)
Elevated, but second reading improved.  I have increased hydralazine from BID to TID, and have asked her to keep a log of her blood pressures and call for an appointment if it continues to be elevated.

## 2011-12-22 NOTE — Assessment & Plan Note (Signed)
Will continue monitoring, she has had vein mapping done and no place for fistula was found, she will likely need graft when she gets closer to needing HD.

## 2011-12-22 NOTE — Assessment & Plan Note (Signed)
Will check lipid panel today as she has not eaten in 5 hours and sometimes does not follow through with getting labs done. F/U labs to ensure she is on the correct dose of statin.

## 2011-12-22 NOTE — Assessment & Plan Note (Signed)
A1C pending, but patient reports relatively good control at home.  Stressed importance of diabetes control for kidney disease.

## 2011-12-22 NOTE — Progress Notes (Signed)
Patient ID: Rachel Hobbs, female   DOB: 29-Jul-1940, 71 y.o.   MRN: [id number]  Subjective:   HPI:   Patient comes in for follow up of chronic medical problems  DM: Patient is taking Lantus, 45 units daily, and glucotrol.  Patient is checking blood sugars.  Fasting sugars range from 100 to 200.  No hyper or hypoglycemic episodes, no polyuria or polydypisa  HTN: Taking amlodipine, coreg, lasix, and hydralazine without difficulty.  Denies chest pain, dizziness, palpitations, LE edema.  Patient has not been checking blood pressures at home, but does have a cuff at home.   CKD Stage3-4 not yet on HD: follows with Dr. Moshe Cipro, understands she will likely need HD one day, but kidney function has stabilized, creatinine around 3.5.  She had vein mapping done of her upper extremities, but no place for a fistula was found, so they are holding off on any access procedures.    HLD- has not had lipids checked in >18 months, ate about 5 hours ago.  She is taking Lipitor as directed, no side effects.   Past Medical History  Diagnosis Date  . Depression   . Hyperlipidemia   . Hypertension   . Diabetes mellitus    Family History  Problem Relation Age of Onset  . Kidney disease Mother   . COPD Father     smoke  . Cancer Father     Lung  . Hypertension Father   . Cancer Brother     prostate  . Kidney disease Sister    History  Substance Use Topics  . Smoking status: Never Smoker   . Smokeless tobacco: Never Used  . Alcohol Use: No    ROS:  Negative except stated in HPI    Objective:  Physical Exam:  BP 176/77  Pulse 76  Temp 97.8 F (36.6 C) (Oral)  Ht 5\' 2"  (1.575 m)  Wt 270 lb (122.471 kg)  BMI 49.38 kg/m2  SpO2 95% Re Check BP: 154/70 General appearance: alert, cooperative and no distress Neck: no adenopathy, no JVD, supple, symmetrical, trachea midline and thyroid not enlarged, symmetric, no tenderness/mass/nodules Lungs: clear to auscultation bilaterally Heart: regular  rate and rhythm, S1, S2 normal, no murmur, click, rub or gallop Extremities: extremities normal, atraumatic, no cyanosis or edema Pulses: 2+ and symmetric       Assessment & Plan:

## 2011-12-22 NOTE — Patient Instructions (Signed)
Your blood pressure today was 154/77  Remember your goal blood pressure is about 130/80.  Please be sure to take your medication every day.  Please also check your blood pressure daily, and if it is consistently above 140/90, please call the office for an appointment.   I will send you a letter with your lab results.

## 2011-12-23 ENCOUNTER — Other Ambulatory Visit (HOSPITAL_COMMUNITY): Payer: Self-pay | Admitting: *Deleted

## 2011-12-23 LAB — LIPID PANEL
HDL: 57 mg/dL (ref >39)
LDL Cholesterol: 55 mg/dL (ref 0–99)

## 2011-12-24 ENCOUNTER — Encounter (HOSPITAL_COMMUNITY)
Admission: RE | Admit: 2011-12-24 | Discharge: 2011-12-24 | Disposition: A | Discharge status: Home / Self Care | Payer: Medicare Other | Source: Ambulatory Visit | Attending: Nephrology | Admitting: Nephrology

## 2011-12-24 DIAGNOSIS — N184 Chronic kidney disease, stage 4 (severe): Secondary | ICD-10-CM | POA: Insufficient documentation

## 2011-12-24 DIAGNOSIS — D638 Anemia in other chronic diseases classified elsewhere: Secondary | ICD-10-CM | POA: Insufficient documentation

## 2011-12-24 LAB — IRON AND TIBC
Iron: 55 ug/dL (ref 42–135)
TIBC: 215 ug/dL — ABNORMAL LOW (ref 250–470)
UIBC: 160 ug/dL (ref 125–400)

## 2011-12-24 LAB — FERRITIN: Ferritin: 256 ng/mL (ref 10–291)

## 2011-12-24 LAB — RENAL FUNCTION PANEL
Albumin: 3.4 g/dL — ABNORMAL LOW (ref 3.5–5.2)
BUN: 57 mg/dL — ABNORMAL HIGH (ref 6–23)
Creatinine, Ser: 3.4 mg/dL — ABNORMAL HIGH (ref 0.50–1.10)
Glucose, Bld: 336 mg/dL — ABNORMAL HIGH (ref 70–99)
Phosphorus: 4.4 mg/dL (ref 2.3–4.6)

## 2011-12-24 MED ORDER — EPOETIN ALFA 20000 UNIT/ML IJ SOLN
20000.0000 [IU] | INTRAMUSCULAR | Status: DC
  Administered 2011-12-24: 20000 [IU] via SUBCUTANEOUS
  Filled 2011-12-24: qty 1

## 2011-12-27 ENCOUNTER — Encounter (HOSPITAL_COMMUNITY): Payer: Medicare Other

## 2011-12-27 LAB — PTH, INTACT AND CALCIUM: PTH: 242.3 pg/mL — ABNORMAL HIGH (ref 14.0–72.0)

## 2011-12-29 ENCOUNTER — Encounter: Payer: Self-pay | Admitting: Family Medicine

## 2012-01-03 ENCOUNTER — Emergency Department (HOSPITAL_COMMUNITY): Payer: Medicare Other

## 2012-01-03 ENCOUNTER — Emergency Department (HOSPITAL_COMMUNITY)
Admission: EM | Admit: 2012-01-03 | Discharge: 2012-01-04 | Disposition: A | Discharge status: Home / Self Care | Payer: Medicare Other | Attending: Emergency Medicine | Admitting: Emergency Medicine

## 2012-01-03 ENCOUNTER — Encounter (HOSPITAL_COMMUNITY): Payer: Self-pay | Admitting: Emergency Medicine

## 2012-01-03 DIAGNOSIS — Z7982 Long term (current) use of aspirin: Secondary | ICD-10-CM | POA: Insufficient documentation

## 2012-01-03 DIAGNOSIS — Z794 Long term (current) use of insulin: Secondary | ICD-10-CM | POA: Insufficient documentation

## 2012-01-03 DIAGNOSIS — Z79899 Other long term (current) drug therapy: Secondary | ICD-10-CM | POA: Insufficient documentation

## 2012-01-03 DIAGNOSIS — E119 Type 2 diabetes mellitus without complications: Secondary | ICD-10-CM | POA: Insufficient documentation

## 2012-01-03 DIAGNOSIS — B349 Viral infection, unspecified: Secondary | ICD-10-CM

## 2012-01-03 DIAGNOSIS — I10 Essential (primary) hypertension: Secondary | ICD-10-CM | POA: Insufficient documentation

## 2012-01-03 DIAGNOSIS — H109 Unspecified conjunctivitis: Secondary | ICD-10-CM | POA: Insufficient documentation

## 2012-01-03 DIAGNOSIS — J069 Acute upper respiratory infection, unspecified: Secondary | ICD-10-CM | POA: Insufficient documentation

## 2012-01-03 LAB — BASIC METABOLIC PANEL
Calcium: 8.5 mg/dL (ref 8.4–10.5)
Creatinine, Ser: 3.66 mg/dL — ABNORMAL HIGH (ref 0.50–1.10)
GFR calc Af Amer: 13 mL/min — ABNORMAL LOW (ref >90)
GFR calc non Af Amer: 12 mL/min — ABNORMAL LOW (ref >90)
Sodium: 137 mEq/L (ref 135–145)

## 2012-01-03 LAB — CBC WITH DIFFERENTIAL/PLATELET
Basophils Absolute: 0 10*3/uL (ref 0.0–0.1)
Basophils Relative: 0 % (ref 0–1)
Eosinophils Absolute: 0.2 10*3/uL (ref 0.0–0.7)
Eosinophils Relative: 3 % (ref 0–5)
HCT: 31.9 % — ABNORMAL LOW (ref 36.0–46.0)
Lymphocytes Relative: 21 % (ref 12–46)
MCHC: 33.5 g/dL (ref 30.0–36.0)
MCV: 95.5 fL (ref 78.0–100.0)
Monocytes Absolute: 0.7 10*3/uL (ref 0.1–1.0)
Platelets: 264 10*3/uL (ref 150–400)
RDW: 12.6 % (ref 11.5–15.5)
WBC: 5.4 10*3/uL (ref 4.0–10.5)

## 2012-01-03 LAB — GLUCOSE, CAPILLARY: Glucose-Capillary: 212 mg/dL — ABNORMAL HIGH (ref 70–99)

## 2012-01-03 MED ORDER — FUROSEMIDE 80 MG PO TABS
80.0000 mg | ORAL_TABLET | Freq: Once | ORAL | Status: DC
  Filled 2012-01-03: qty 1

## 2012-01-03 MED ORDER — ACETAMINOPHEN 325 MG PO TABS
650.0000 mg | ORAL_TABLET | Freq: Once | ORAL | Status: AC
  Administered 2012-01-03: 650 mg via ORAL
  Filled 2012-01-03: qty 2

## 2012-01-03 MED ORDER — OXYMETAZOLINE HCL 0.05 % NA SOLN
2.0000 | Freq: Once | NASAL | Status: AC
  Administered 2012-01-03: 2 via NASAL
  Filled 2012-01-03: qty 15

## 2012-01-03 MED ORDER — POLYMYXIN B-TRIMETHOPRIM 10000-0.1 UNIT/ML-% OP SOLN
2.0000 [drp] | Freq: Four times a day (QID) | OPHTHALMIC | Status: DC
  Administered 2012-01-03: 2 [drp] via OPHTHALMIC
  Filled 2012-01-03: qty 10

## 2012-01-03 NOTE — ED Notes (Signed)
Patient is not showing any signs of distress at this time

## 2012-01-03 NOTE — ED Notes (Signed)
EMS called home.  Found patient sitting on couch.  Alert and oriented x3.  She was complaining of shortness of breath Patient was 99% ORN EMS placed patient on 3L after Shortness of breath during ambulation Patient denies any pain

## 2012-01-03 NOTE — ED Notes (Signed)
Patient still states that she is having pressure but the afrin did help some

## 2012-01-04 NOTE — ED Provider Notes (Signed)
History     CSN: [id number]  Arrival date & time 01/03/12  [id number]   First MD Initiated Contact with Patient 01/03/12 2120      Chief Complaint  Patient presents with  . Shortness of Breath  . Fatigue  . Weakness    (Consider location/radiation/quality/duration/timing/severity/associated sxs/prior treatment) HPI Patient is a 71 yo female who called EMS this evening for SOB.  Patient has been satting well since their arrival and denies any chest pain or shortness of breath here.  Instead she reports several days of cough, nasal congestion, and bilateral eye drainage.  She has not taken anything for this.  She denies sick contacts.  She denies fever, nausea, vomiting, leg swelling, palpitations, and describes 4/10 discomfort from her symptoms.  She has no radiation of pain.  Pain is not made worse by anything and patient has not tried anything to make it better.  There are no other associated or modifying factors.  Past Medical History  Diagnosis Date  . Depression   . Hyperlipidemia   . Hypertension   . Diabetes mellitus     Past Surgical History  Procedure Date  . Hernia repair   . Abdominal hysterectomy     Family History  Problem Relation Age of Onset  . Kidney disease Mother   . COPD Father     smoke  . Cancer Father     Lung  . Hypertension Father   . Cancer Brother     prostate  . Kidney disease Sister     History  Substance Use Topics  . Smoking status: Never Smoker   . Smokeless tobacco: Never Used  . Alcohol Use: No    OB History    Grav Para Term Preterm Abortions TAB SAB Ect Mult Living                  Review of Systems  Constitutional: Positive for fatigue.  HENT: Positive for congestion and rhinorrhea.   Eyes: Positive for discharge.  Respiratory: Positive for cough.   Cardiovascular: Negative.   Gastrointestinal: Negative.   Genitourinary: Negative.   Musculoskeletal: Negative.   Skin: Negative.   Neurological: Negative.     Hematological: Negative.   Psychiatric/Behavioral: Negative.   All other systems reviewed and are negative.    Allergies  Review of patient's allergies indicates no known allergies.  Home Medications   Current Outpatient Rx  Name Route Sig Dispense Refill  . AMLODIPINE BESYLATE 10 MG PO TABS Oral Take 1 tablet (10 mg total) by mouth daily. 90 tablet 3  . ASPIRIN 81 MG PO TABS Oral Take 81 mg by mouth daily.      . ATORVASTATIN CALCIUM 40 MG PO TABS Oral Take 1 tablet (40 mg total) by mouth daily. For cholesterol. 90 tablet 2  . CALCITRIOL 0.25 MCG PO CAPS Oral Take 0.25 mcg by mouth daily.     Marland Kitchen CARVEDILOL 12.5 MG PO TABS Oral Take 12.5 mg by mouth 2 (two) times daily with a meal.    . DOCUSATE SODIUM 100 MG PO CAPS Oral Take 100 mg by mouth at bedtime.     . FUROSEMIDE 80 MG PO TABS Oral Take 80 mg by mouth 2 (two) times daily.    Marland Kitchen GLIPIZIDE 10 MG PO TABS Oral Take 1 tablet (10 mg total) by mouth 2 (two) times daily before a meal. 180 tablet 3  . HYDRALAZINE HCL 50 MG PO TABS Oral Take 1 tablet (50 mg total) by  mouth 3 (three) times daily. 270 tablet 3  . INSULIN GLARGINE 100 UNIT/ML Sabin SOLN Subcutaneous Inject 35-40 Units into the skin at bedtime.     Marland Kitchen KETOTIFEN FUMARATE 0.025 % OP SOLN Both Eyes Place 1 drop into both eyes 2 (two) times daily.     . ONE DAILY WOMENS PO TABS Oral Take by mouth daily.        BP 160/55  Pulse 76  Temp 99.1 F (37.3 C)  Resp 20  SpO2 100%  Physical Exam  Nursing note and vitals reviewed. GEN: Well-developed, well-nourished female in no distress HEENT: Atraumatic, normocephalic. BL mucosal edema in the nares, TM clear BL, Oropharynx clear without erythema EYES: PERRLA BL, no scleral icterus. Injection BL, slight yellowish conjunctival d/c BL NECK: Trachea midline, no meningismus CV: regular rate and rhythm. No murmurs, rubs, or gallops PULM: No respiratory distress.  No crackles, wheezes, or rales. GI: soft, non-tender. No guarding,  rebound, or tenderness. + bowel sounds  GU: deferred Neuro: cranial nerves grossly 2-12 intact, no abnormalities of strength or sensation, A and O x 3 MSK: Patient moves all 4 extremities symmetrically, no deformity, edema, or injury noted Skin: No rashes petechiae, purpura, or jaundice Psych: no abnormality of mood   ED Course  Procedures (including critical care time)  Indication: shortness of breath Please note this EKG was reviewed extemporaneously by myself.   Date: 01/03/2012  Rate: 76  Rhythm: normal sinus rhythm  QRS Axis: normal  Intervals: normal  ST/T Wave abnormalities: nonspecific T wave changes  Conduction Disutrbances:none  Narrative Interpretation:   Old EKG Reviewed: unchanged      Labs Reviewed  CBC WITH DIFFERENTIAL - Abnormal; Notable for the following:    RBC 3.34 (*)     Hemoglobin 10.7 (*)     HCT 31.9 (*)     Monocytes Relative 13 (*)     All other components within normal limits  BASIC METABOLIC PANEL - Abnormal; Notable for the following:    Glucose, Bld 199 (*)     BUN 49 (*)     Creatinine, Ser 3.66 (*)     GFR calc non Af Amer 12 (*)     GFR calc Af Amer 13 (*)     All other components within normal limits  GLUCOSE, CAPILLARY - Abnormal; Notable for the following:    Glucose-Capillary 212 (*)     All other components within normal limits   Dg Chest 2 View  01/03/2012  *RADIOLOGY REPORT*  Clinical Data: Shortness of breath, weakness and fatigue.  CHEST - 2 VIEW  Comparison: PA and lateral chest 06/25/2011.  Findings: There is cardiomegaly without edema.  Pulmonary vascular congestion is noted.  No focal airspace disease, pneumothorax or pleural fluid.  IMPRESSION: Cardiomegaly and pulmonary vascular congestion.   Original Report Authenticated By: Arvid Right. D'ALESSIO, M.D.      1. URI (upper respiratory infection)   2. Viral syndrome   3. Conjunctivitis       MDM  Patient was evaluated by myself.  She was in no distress and denied  CP or shortness of breath to me despite that this is why she called the ambulance.  ECG was unremarkable.  The patient had baseline anemia and renal insufficiency without hyperkalemia.  Patient was given afrin, tylenol, and bactrim gtts for her eye symptoms.  Patient had unremarkable CXR and was able to ambulate throughout the department with normal O2 sat.She can continue her Afrin for 3 days  and was told to take tylenol for symptoms,  7 days s of eye drops provided.  Patient can follow-up with her PCP at [facility].        Chauncy Passy, MD 01/04/12 1242

## 2012-01-04 NOTE — ED Notes (Signed)
Patient is alert and oriented x3.  She was given DC instructions and follow up visit instructions.  Patient gave verbal understanding. She was DC via wheelchair to lobby to wait on her ride.  V/S stable.  He was not showing any signs of distress on DC

## 2012-01-07 ENCOUNTER — Encounter (HOSPITAL_COMMUNITY)
Admission: RE | Admit: 2012-01-07 | Discharge: 2012-01-07 | Disposition: A | Discharge status: Home / Self Care | Payer: Medicare Other | Source: Ambulatory Visit | Attending: Nephrology | Admitting: Nephrology

## 2012-01-07 MED ORDER — EPOETIN ALFA 20000 UNIT/ML IJ SOLN
20000.0000 [IU] | INTRAMUSCULAR | Status: DC
  Administered 2012-01-07: 20000 [IU] via SUBCUTANEOUS

## 2012-01-07 MED ORDER — EPOETIN ALFA 20000 UNIT/ML IJ SOLN
INTRAMUSCULAR | Status: AC
  Filled 2012-01-07: qty 1

## 2012-01-21 ENCOUNTER — Encounter (HOSPITAL_COMMUNITY)
Admission: RE | Admit: 2012-01-21 | Discharge: 2012-01-21 | Disposition: A | Discharge status: Home / Self Care | Payer: Medicare Other | Source: Ambulatory Visit | Attending: Nephrology | Admitting: Nephrology

## 2012-01-21 DIAGNOSIS — N184 Chronic kidney disease, stage 4 (severe): Secondary | ICD-10-CM | POA: Insufficient documentation

## 2012-01-21 DIAGNOSIS — D638 Anemia in other chronic diseases classified elsewhere: Secondary | ICD-10-CM | POA: Insufficient documentation

## 2012-01-21 LAB — RENAL FUNCTION PANEL
Albumin: 3.4 g/dL — ABNORMAL LOW (ref 3.5–5.2)
Chloride: 100 mEq/L (ref 96–112)
GFR calc non Af Amer: 11 mL/min — ABNORMAL LOW (ref >90)
Phosphorus: 4.6 mg/dL (ref 2.3–4.6)
Potassium: 4.4 mEq/L (ref 3.5–5.1)

## 2012-01-21 LAB — IRON AND TIBC
Iron: 49 ug/dL (ref 42–135)
TIBC: 216 ug/dL — ABNORMAL LOW (ref 250–470)

## 2012-01-21 LAB — POCT HEMOGLOBIN-HEMACUE: Hemoglobin: 10.2 g/dL — ABNORMAL LOW (ref 12.0–15.0)

## 2012-01-21 MED ORDER — EPOETIN ALFA 20000 UNIT/ML IJ SOLN
INTRAMUSCULAR | Status: AC
  Filled 2012-01-21: qty 1

## 2012-01-21 MED ORDER — EPOETIN ALFA 20000 UNIT/ML IJ SOLN
20000.0000 [IU] | INTRAMUSCULAR | Status: DC
  Administered 2012-01-21: 20000 [IU] via SUBCUTANEOUS

## 2012-01-24 LAB — PTH, INTACT AND CALCIUM: PTH: 430.8 pg/mL — ABNORMAL HIGH (ref 14.0–72.0)

## 2012-02-03 ENCOUNTER — Other Ambulatory Visit (HOSPITAL_COMMUNITY): Payer: Self-pay | Admitting: *Deleted

## 2012-02-04 ENCOUNTER — Encounter (HOSPITAL_COMMUNITY)
Admission: RE | Admit: 2012-02-04 | Discharge: 2012-02-04 | Disposition: A | Discharge status: Home / Self Care | Payer: Medicare Other | Source: Ambulatory Visit | Attending: Nephrology | Admitting: Nephrology

## 2012-02-04 LAB — POCT HEMOGLOBIN-HEMACUE: Hemoglobin: 10 g/dL — ABNORMAL LOW (ref 12.0–15.0)

## 2012-02-04 MED ORDER — EPOETIN ALFA 20000 UNIT/ML IJ SOLN
20000.0000 [IU] | INTRAMUSCULAR | Status: DC
  Administered 2012-02-04: 20000 [IU] via SUBCUTANEOUS

## 2012-02-04 MED ORDER — EPOETIN ALFA 20000 UNIT/ML IJ SOLN
INTRAMUSCULAR | Status: AC
  Administered 2012-02-04: 20000 [IU] via SUBCUTANEOUS
  Filled 2012-02-04: qty 1

## 2012-02-04 MED ORDER — SODIUM CHLORIDE 0.9 % IV SOLN
INTRAVENOUS | Status: DC
  Administered 2012-02-04: 15:00:00 via INTRAVENOUS

## 2012-02-04 MED ORDER — FERUMOXYTOL INJECTION 510 MG/17 ML
INTRAVENOUS | Status: AC
  Administered 2012-02-04: 510 mg via INTRAVENOUS
  Filled 2012-02-04: qty 17

## 2012-02-04 MED ORDER — FERUMOXYTOL INJECTION 510 MG/17 ML
510.0000 mg | INTRAVENOUS | Status: DC
  Administered 2012-02-04: 510 mg via INTRAVENOUS

## 2012-02-11 ENCOUNTER — Encounter (HOSPITAL_COMMUNITY)
Admission: RE | Admit: 2012-02-11 | Discharge: 2012-02-11 | Disposition: A | Discharge status: Home / Self Care | Payer: Medicare Other | Source: Ambulatory Visit | Attending: Nephrology | Admitting: Nephrology

## 2012-02-11 MED ORDER — FERUMOXYTOL INJECTION 510 MG/17 ML
510.0000 mg | INTRAVENOUS | Status: AC
  Administered 2012-02-11: 510 mg via INTRAVENOUS

## 2012-02-11 MED ORDER — SODIUM CHLORIDE 0.9 % IV SOLN
INTRAVENOUS | Status: DC
  Administered 2012-02-11: 250 mL via INTRAVENOUS

## 2012-02-11 MED ORDER — FERUMOXYTOL INJECTION 510 MG/17 ML
INTRAVENOUS | Status: AC
  Administered 2012-02-11: 510 mg via INTRAVENOUS
  Filled 2012-02-11: qty 17

## 2012-02-11 MED ORDER — FERUMOXYTOL INJECTION 510 MG/17 ML
INTRAVENOUS | Status: AC
  Filled 2012-02-11: qty 17

## 2012-02-18 ENCOUNTER — Encounter (HOSPITAL_COMMUNITY)
Admission: RE | Admit: 2012-02-18 | Discharge: 2012-02-18 | Disposition: A | Discharge status: Home / Self Care | Payer: Medicare Other | Source: Ambulatory Visit | Attending: Nephrology | Admitting: Nephrology

## 2012-02-18 DIAGNOSIS — N184 Chronic kidney disease, stage 4 (severe): Secondary | ICD-10-CM | POA: Insufficient documentation

## 2012-02-18 DIAGNOSIS — D638 Anemia in other chronic diseases classified elsewhere: Secondary | ICD-10-CM | POA: Insufficient documentation

## 2012-02-18 LAB — RENAL FUNCTION PANEL
Albumin: 3.6 g/dL (ref 3.5–5.2)
Calcium: 8.7 mg/dL (ref 8.4–10.5)
Creatinine, Ser: 3.54 mg/dL — ABNORMAL HIGH (ref 0.50–1.10)
GFR calc Af Amer: 14 mL/min — ABNORMAL LOW (ref >90)
GFR calc non Af Amer: 12 mL/min — ABNORMAL LOW (ref >90)
Sodium: 140 mEq/L (ref 135–145)

## 2012-02-18 LAB — POCT HEMOGLOBIN-HEMACUE: Hemoglobin: 11.8 g/dL — ABNORMAL LOW (ref 12.0–15.0)

## 2012-02-18 MED ORDER — EPOETIN ALFA 20000 UNIT/ML IJ SOLN
20000.0000 [IU] | INTRAMUSCULAR | Status: DC
  Administered 2012-02-18: 20000 [IU] via SUBCUTANEOUS

## 2012-02-18 MED ORDER — EPOETIN ALFA 20000 UNIT/ML IJ SOLN
INTRAMUSCULAR | Status: AC
  Administered 2012-02-18: 20000 [IU] via SUBCUTANEOUS
  Filled 2012-02-18: qty 1

## 2012-02-21 LAB — PTH, INTACT AND CALCIUM
Calcium, Total (PTH): 8.8 mg/dL (ref 8.4–10.5)
PTH: 255.8 pg/mL — ABNORMAL HIGH (ref 14.0–72.0)

## 2012-03-03 ENCOUNTER — Encounter (HOSPITAL_COMMUNITY): Payer: Medicare Other

## 2012-03-09 ENCOUNTER — Other Ambulatory Visit (HOSPITAL_COMMUNITY): Payer: Self-pay | Admitting: *Deleted

## 2012-03-10 ENCOUNTER — Encounter (HOSPITAL_COMMUNITY)
Admission: RE | Admit: 2012-03-10 | Discharge: 2012-03-10 | Disposition: A | Discharge status: Home / Self Care | Payer: Medicare Other | Source: Ambulatory Visit | Attending: Nephrology | Admitting: Nephrology

## 2012-03-10 LAB — POCT HEMOGLOBIN-HEMACUE: Hemoglobin: 11.6 g/dL — ABNORMAL LOW (ref 12.0–15.0)

## 2012-03-10 MED ORDER — EPOETIN ALFA 20000 UNIT/ML IJ SOLN
INTRAMUSCULAR | Status: AC
  Administered 2012-03-10: 20000 [IU] via SUBCUTANEOUS
  Filled 2012-03-10: qty 1

## 2012-03-10 MED ORDER — EPOETIN ALFA 20000 UNIT/ML IJ SOLN: 20000.0000 [IU] | INTRAMUSCULAR | Status: DC

## 2012-03-31 ENCOUNTER — Encounter: Payer: Self-pay | Admitting: Family Medicine

## 2012-03-31 ENCOUNTER — Ambulatory Visit (INDEPENDENT_AMBULATORY_CARE_PROVIDER_SITE_OTHER): Payer: Medicare Other | Admitting: Family Medicine

## 2012-03-31 ENCOUNTER — Encounter (HOSPITAL_COMMUNITY)
Admission: RE | Admit: 2012-03-31 | Discharge: 2012-03-31 | Disposition: A | Discharge status: Home / Self Care | Payer: Medicare Other | Source: Ambulatory Visit | Attending: Nephrology | Admitting: Nephrology

## 2012-03-31 VITALS — Temp 97.9°F | Ht 62.0 in | Wt 278.0 lb

## 2012-03-31 DIAGNOSIS — N184 Chronic kidney disease, stage 4 (severe): Secondary | ICD-10-CM | POA: Insufficient documentation

## 2012-03-31 DIAGNOSIS — D638 Anemia in other chronic diseases classified elsewhere: Secondary | ICD-10-CM | POA: Insufficient documentation

## 2012-03-31 DIAGNOSIS — M109 Gout, unspecified: Secondary | ICD-10-CM

## 2012-03-31 LAB — RENAL FUNCTION PANEL
Albumin: 3.4 g/dL — ABNORMAL LOW (ref 3.5–5.2)
Calcium: 8.7 mg/dL (ref 8.4–10.5)
GFR calc Af Amer: 15 mL/min — ABNORMAL LOW (ref >90)
GFR calc non Af Amer: 13 mL/min — ABNORMAL LOW (ref >90)
Phosphorus: 4.2 mg/dL (ref 2.3–4.6)
Sodium: 140 mEq/L (ref 135–145)

## 2012-03-31 LAB — IRON AND TIBC: UIBC: 149 ug/dL (ref 125–400)

## 2012-03-31 MED ORDER — HYDROCODONE-ACETAMINOPHEN 5-500 MG PO TABS: 1.0000 | ORAL_TABLET | Freq: Three times a day (TID) | ORAL | Status: DC | PRN

## 2012-03-31 MED ORDER — EPOETIN ALFA 20000 UNIT/ML IJ SOLN
INTRAMUSCULAR | Status: AC
  Administered 2012-03-31: 20000 [IU] via SUBCUTANEOUS
  Filled 2012-03-31: qty 1

## 2012-03-31 MED ORDER — PREDNISONE 20 MG PO TABS: 40.0000 mg | ORAL_TABLET | Freq: Every day | ORAL | Status: DC

## 2012-03-31 MED ORDER — EPOETIN ALFA 20000 UNIT/ML IJ SOLN
20000.0000 [IU] | INTRAMUSCULAR | Status: DC
  Administered 2012-03-31: 20000 [IU] via SUBCUTANEOUS

## 2012-03-31 NOTE — Progress Notes (Signed)
  Subjective:    Patient ID: Rachel Hobbs, female    DOB: 1940-10-31, 71 y.o.   MRN: [id number]  HPI Pt. Is here with acute gouty flare.  Had injection for anemia earlier today.  Has ESRD but not on dialysis.  She has had flares in the past.  Cannot tolerate NSAIDS or colchicine secondary to renal insuff.  Responded previously to steroids.  Has to be careful for Bs after steroid.  She asks for pain meds as well.  No recent red meat and no alcohol.   Review of Systems  Constitutional: Positive for fatigue. Negative for fever and chills.  Respiratory: Negative for shortness of breath.   Gastrointestinal: Negative for abdominal pain.       Objective:   Physical Exam  Vitals reviewed. Constitutional: She appears well-developed and well-nourished.  HENT:  Head: Normocephalic and atraumatic.  Eyes: No scleral icterus.  Neck: Neck supple.  Cardiovascular: Normal rate.   Pulmonary/Chest: Effort normal.  Abdominal: Soft.  Musculoskeletal: She exhibits edema and tenderness.       Erythema over MCT joint on left great toe.          Assessment & Plan:

## 2012-03-31 NOTE — Patient Instructions (Signed)
Gout Gout is an inflammatory condition (arthritis) caused by a buildup of uric acid crystals in the joints. Uric acid is a chemical that is normally present in the blood. Under some circumstances, uric acid can form into crystals in your joints. This causes joint redness, soreness, and swelling (inflammation). Repeat attacks are common. Over time, uric acid crystals can form into masses (tophi) near a joint, causing disfigurement. Gout is treatable and often preventable. CAUSES  The disease begins with elevated levels of uric acid in the blood. Uric acid is produced by your body when it breaks down a naturally found substance called purines. This also happens when you eat certain foods such as meats and fish. Causes of an elevated uric acid level include:  Being passed down from parent to child (heredity).  Diseases that cause increased uric acid production (obesity, psoriasis, some cancers).  Excessive alcohol use.  Diet, especially diets rich in meat and seafood.  Medicines, including certain cancer-fighting drugs (chemotherapy), diuretics, and aspirin.  Chronic kidney disease. The kidneys are no longer able to remove uric acid well.  Problems with metabolism. Conditions strongly associated with gout include:  Obesity.  High blood pressure.  High cholesterol.  Diabetes. Not everyone with elevated uric acid levels gets gout. It is not understood why some people get gout and others do not. Surgery, joint injury, and eating too much of certain foods are some of the factors that can lead to gout. SYMPTOMS   An attack of gout comes on quickly. It causes intense pain with redness, swelling, and warmth in a joint.  Fever can occur.  Often, only one joint is involved. Certain joints are more commonly involved:  Base of the big toe.  Knee.  Ankle.  Wrist.  Finger. Without treatment, an attack usually goes away in a few days to weeks. Between attacks, you usually will not have  symptoms, which is different from many other forms of arthritis. DIAGNOSIS  Your caregiver will suspect gout based on your symptoms and exam. Removal of fluid from the joint (arthrocentesis) is done to check for uric acid crystals. Your caregiver will give you a medicine that numbs the area (local anesthetic) and use a needle to remove joint fluid for exam. Gout is confirmed when uric acid crystals are seen in joint fluid, using a special microscope. Sometimes, blood, urine, and X-ray tests are also used. TREATMENT  There are 2 phases to gout treatment: treating the sudden onset (acute) attack and preventing attacks (prophylaxis). Treatment of an Acute Attack  Medicines are used. These include anti-inflammatory medicines or steroid medicines.  An injection of steroid medicine into the affected joint is sometimes necessary.  The painful joint is rested. Movement can worsen the arthritis.  You may use warm or cold treatments on painful joints, depending which works best for you.  Discuss the use of coffee, vitamin C, or cherries with your caregiver. These may be helpful treatment options. Treatment to Prevent Attacks After the acute attack subsides, your caregiver may advise prophylactic medicine. These medicines either help your kidneys eliminate uric acid from your body or decrease your uric acid production. You may need to stay on these medicines for a very long time. The early phase of treatment with prophylactic medicine can be associated with an increase in acute gout attacks. For this reason, during the first few months of treatment, your caregiver may also advise you to take medicines usually used for acute gout treatment. Be sure you understand your caregiver's directions.   You should also discuss dietary treatment with your caregiver. Certain foods such as meats and fish can increase uric acid levels. Other foods such as dairy can decrease levels. Your caregiver can give you a list of foods  to avoid. HOME CARE INSTRUCTIONS   Do not take aspirin to relieve pain. This raises uric acid levels.  Only take over-the-counter or prescription medicines for pain, discomfort, or fever as directed by your caregiver.  Rest the joint as much as possible. When in bed, keep sheets and blankets off painful areas.  Keep the affected joint raised (elevated).  Use crutches if the painful joint is in your leg.  Drink enough water and fluids to keep your urine clear or pale yellow. This helps your body get rid of uric acid. Do not drink alcoholic beverages. They slow the passage of uric acid.  Follow your caregiver's dietary instructions. Pay careful attention to the amount of protein you eat. Your daily diet should emphasize fruits, vegetables, whole grains, and fat-free or low-fat milk products.  Maintain a healthy body weight. SEEK MEDICAL CARE IF:   You have an oral temperature above 102 F (38.9 C).  You develop diarrhea, vomiting, or any side effects from medicines.  You do not feel better in 24 hours, or you are getting worse. SEEK IMMEDIATE MEDICAL CARE IF:   Your joint becomes suddenly more tender and you have:  Chills.  An oral temperature above 102 F (38.9 C), not controlled by medicine. MAKE SURE YOU:   Understand these instructions.  Will watch your condition.  Will get help right away if you are not doing well or get worse. Document Released: 04/02/2000 Document Revised: 06/28/2011 Document Reviewed: 07/14/2009 [employer] Patient Information 2013 [employer].    

## 2012-03-31 NOTE — Assessment & Plan Note (Signed)
Prednisone 40 mg daily x 3-4 days.  Stop after symptoms resolve.  Pain meds.

## 2012-04-03 LAB — POCT HEMOGLOBIN-HEMACUE: Hemoglobin: 11.6 g/dL — ABNORMAL LOW (ref 12.0–15.0)

## 2012-04-03 LAB — PTH, INTACT AND CALCIUM: Calcium, Total (PTH): 8.5 mg/dL (ref 8.4–10.5)

## 2012-04-21 ENCOUNTER — Inpatient Hospital Stay (HOSPITAL_COMMUNITY)
Admission: EM | Admit: 2012-04-21 | Discharge: 2012-04-25 | DRG: 292 | Disposition: A | Discharge status: Home Health | Payer: Medicare Other | Attending: Family Medicine | Admitting: Family Medicine

## 2012-04-21 ENCOUNTER — Emergency Department (HOSPITAL_COMMUNITY): Payer: Medicare Other

## 2012-04-21 ENCOUNTER — Encounter (HOSPITAL_COMMUNITY): Payer: Medicare Other

## 2012-04-21 ENCOUNTER — Encounter (HOSPITAL_COMMUNITY): Payer: Self-pay | Admitting: Emergency Medicine

## 2012-04-21 DIAGNOSIS — Z841 Family history of disorders of kidney and ureter: Secondary | ICD-10-CM

## 2012-04-21 DIAGNOSIS — N184 Chronic kidney disease, stage 4 (severe): Secondary | ICD-10-CM | POA: Diagnosis present

## 2012-04-21 DIAGNOSIS — F3289 Other specified depressive episodes: Secondary | ICD-10-CM | POA: Diagnosis present

## 2012-04-21 DIAGNOSIS — R609 Edema, unspecified: Secondary | ICD-10-CM | POA: Diagnosis present

## 2012-04-21 DIAGNOSIS — Z836 Family history of other diseases of the respiratory system: Secondary | ICD-10-CM

## 2012-04-21 DIAGNOSIS — E118 Type 2 diabetes mellitus with unspecified complications: Secondary | ICD-10-CM | POA: Diagnosis present

## 2012-04-21 DIAGNOSIS — Z801 Family history of malignant neoplasm of trachea, bronchus and lung: Secondary | ICD-10-CM

## 2012-04-21 DIAGNOSIS — E785 Hyperlipidemia, unspecified: Secondary | ICD-10-CM | POA: Diagnosis present

## 2012-04-21 DIAGNOSIS — Z8042 Family history of malignant neoplasm of prostate: Secondary | ICD-10-CM

## 2012-04-21 DIAGNOSIS — I509 Heart failure, unspecified: Secondary | ICD-10-CM | POA: Diagnosis present

## 2012-04-21 DIAGNOSIS — K458 Other specified abdominal hernia without obstruction or gangrene: Secondary | ICD-10-CM | POA: Diagnosis present

## 2012-04-21 DIAGNOSIS — N19 Unspecified kidney failure: Secondary | ICD-10-CM | POA: Diagnosis present

## 2012-04-21 DIAGNOSIS — I129 Hypertensive chronic kidney disease with stage 1 through stage 4 chronic kidney disease, or unspecified chronic kidney disease: Secondary | ICD-10-CM | POA: Diagnosis present

## 2012-04-21 DIAGNOSIS — Z91199 Patient's noncompliance with other medical treatment and regimen due to unspecified reason: Secondary | ICD-10-CM

## 2012-04-21 DIAGNOSIS — I10 Essential (primary) hypertension: Secondary | ICD-10-CM

## 2012-04-21 DIAGNOSIS — I5033 Acute on chronic diastolic (congestive) heart failure: Principal | ICD-10-CM | POA: Diagnosis present

## 2012-04-21 DIAGNOSIS — I5030 Unspecified diastolic (congestive) heart failure: Secondary | ICD-10-CM | POA: Diagnosis present

## 2012-04-21 DIAGNOSIS — Z9119 Patient's noncompliance with other medical treatment and regimen: Secondary | ICD-10-CM

## 2012-04-21 DIAGNOSIS — E119 Type 2 diabetes mellitus without complications: Secondary | ICD-10-CM | POA: Diagnosis present

## 2012-04-21 DIAGNOSIS — Z8249 Family history of ischemic heart disease and other diseases of the circulatory system: Secondary | ICD-10-CM

## 2012-04-21 DIAGNOSIS — R0602 Shortness of breath: Secondary | ICD-10-CM | POA: Diagnosis present

## 2012-04-21 DIAGNOSIS — F329 Major depressive disorder, single episode, unspecified: Secondary | ICD-10-CM | POA: Diagnosis present

## 2012-04-21 DIAGNOSIS — R0600 Dyspnea, unspecified: Secondary | ICD-10-CM | POA: Diagnosis present

## 2012-04-21 HISTORY — DX: Unspecified abdominal hernia without obstruction or gangrene: K46.9

## 2012-04-21 LAB — CBC
Hemoglobin: 10.3 g/dL — ABNORMAL LOW (ref 12.0–15.0)
MCH: 31.2 pg (ref 26.0–34.0)
MCHC: 32 g/dL (ref 30.0–36.0)
MCV: 97.6 fL (ref 78.0–100.0)

## 2012-04-21 LAB — POCT I-STAT, CHEM 8
BUN: 55 mg/dL — ABNORMAL HIGH (ref 6–23)
Chloride: 107 mEq/L (ref 96–112)
Creatinine, Ser: 3.3 mg/dL — ABNORMAL HIGH (ref 0.50–1.10)
Glucose, Bld: 232 mg/dL — ABNORMAL HIGH (ref 70–99)
Potassium: 4.9 mEq/L (ref 3.5–5.1)
Sodium: 140 mEq/L (ref 135–145)

## 2012-04-21 LAB — CREATININE, SERUM: Creatinine, Ser: 3.62 mg/dL — ABNORMAL HIGH (ref 0.50–1.10)

## 2012-04-21 LAB — CBC WITH DIFFERENTIAL/PLATELET
Hemoglobin: 10.7 g/dL — ABNORMAL LOW (ref 12.0–15.0)
Lymphocytes Relative: 13 % (ref 12–46)
Lymphs Abs: 0.8 10*3/uL (ref 0.7–4.0)
MCH: 30.4 pg (ref 26.0–34.0)
Monocytes Relative: 8 % (ref 3–12)
Neutro Abs: 4.6 10*3/uL (ref 1.7–7.7)
Neutrophils Relative %: 76 % (ref 43–77)
Platelets: 226 10*3/uL (ref 150–400)
RBC: 3.52 MIL/uL — ABNORMAL LOW (ref 3.87–5.11)
WBC: 6.1 10*3/uL (ref 4.0–10.5)

## 2012-04-21 LAB — GLUCOSE, CAPILLARY: Glucose-Capillary: 122 mg/dL — ABNORMAL HIGH (ref 70–99)

## 2012-04-21 MED ORDER — DOCUSATE SODIUM 100 MG PO CAPS
100.0000 mg | ORAL_CAPSULE | Freq: Every day | ORAL | Status: DC
  Administered 2012-04-21 – 2012-04-24 (×4): 100 mg via ORAL
  Filled 2012-04-21 (×5): qty 1

## 2012-04-21 MED ORDER — AMLODIPINE BESYLATE 10 MG PO TABS
10.0000 mg | ORAL_TABLET | Freq: Every day | ORAL | Status: DC
  Administered 2012-04-21 – 2012-04-25 (×5): 10 mg via ORAL
  Filled 2012-04-21 (×5): qty 1

## 2012-04-21 MED ORDER — SODIUM CHLORIDE 0.9 % IV SOLN
Freq: Once | INTRAVENOUS | Status: AC
  Administered 2012-04-21: 05:00:00 via INTRAVENOUS

## 2012-04-21 MED ORDER — SODIUM CHLORIDE 0.9 % IJ SOLN: 3.0000 mL | INTRAMUSCULAR | Status: DC | PRN

## 2012-04-21 MED ORDER — SODIUM CHLORIDE 0.9 % IJ SOLN
3.0000 mL | Freq: Two times a day (BID) | INTRAMUSCULAR | Status: DC
  Administered 2012-04-21 – 2012-04-22 (×2): via INTRAVENOUS
  Administered 2012-04-22 – 2012-04-25 (×6): 3 mL via INTRAVENOUS

## 2012-04-21 MED ORDER — DEXTROSE 5 % IV SOLN
100.0000 mg | Freq: Two times a day (BID) | INTRAVENOUS | Status: DC
  Filled 2012-04-21 (×2): qty 10

## 2012-04-21 MED ORDER — ASPIRIN 81 MG PO TABS: 81.0000 mg | ORAL_TABLET | Freq: Every day | ORAL | Status: DC

## 2012-04-21 MED ORDER — INSULIN ASPART 100 UNIT/ML ~~LOC~~ SOLN
0.0000 [IU] | Freq: Three times a day (TID) | SUBCUTANEOUS | Status: DC
  Administered 2012-04-22: 3 [IU] via SUBCUTANEOUS
  Administered 2012-04-22 – 2012-04-23 (×2): 2 [IU] via SUBCUTANEOUS
  Administered 2012-04-23: 3 [IU] via SUBCUTANEOUS
  Administered 2012-04-23: 07:00:00 via SUBCUTANEOUS
  Administered 2012-04-24 (×2): 2 [IU] via SUBCUTANEOUS
  Administered 2012-04-25: 3 [IU] via SUBCUTANEOUS

## 2012-04-21 MED ORDER — CARVEDILOL 12.5 MG PO TABS
12.5000 mg | ORAL_TABLET | Freq: Two times a day (BID) | ORAL | Status: DC
  Administered 2012-04-22 – 2012-04-25 (×8): 12.5 mg via ORAL
  Filled 2012-04-21 (×9): qty 1

## 2012-04-21 MED ORDER — HEPARIN SODIUM (PORCINE) 5000 UNIT/ML IJ SOLN
5000.0000 [IU] | Freq: Three times a day (TID) | INTRAMUSCULAR | Status: DC
  Administered 2012-04-21 – 2012-04-25 (×12): 5000 [IU] via SUBCUTANEOUS
  Filled 2012-04-21 (×14): qty 1

## 2012-04-21 MED ORDER — FUROSEMIDE 10 MG/ML IJ SOLN
80.0000 mg | Freq: Three times a day (TID) | INTRAMUSCULAR | Status: AC
  Administered 2012-04-21 – 2012-04-22 (×3): 80 mg via INTRAVENOUS
  Filled 2012-04-21 (×3): qty 8

## 2012-04-21 MED ORDER — HYDRALAZINE HCL 50 MG PO TABS
50.0000 mg | ORAL_TABLET | Freq: Three times a day (TID) | ORAL | Status: DC
  Administered 2012-04-21 – 2012-04-25 (×13): 50 mg via ORAL
  Filled 2012-04-21 (×14): qty 1

## 2012-04-21 MED ORDER — INSULIN GLARGINE 100 UNIT/ML ~~LOC~~ SOLN
35.0000 [IU] | Freq: Every day | SUBCUTANEOUS | Status: DC
  Administered 2012-04-21 – 2012-04-24 (×4): 35 [IU] via SUBCUTANEOUS

## 2012-04-21 MED ORDER — FUROSEMIDE 10 MG/ML IJ SOLN: 80.0000 mg | Freq: Two times a day (BID) | INTRAMUSCULAR | Status: DC

## 2012-04-21 MED ORDER — ATORVASTATIN CALCIUM 40 MG PO TABS
40.0000 mg | ORAL_TABLET | Freq: Every day | ORAL | Status: DC
  Administered 2012-04-21 – 2012-04-25 (×5): 40 mg via ORAL
  Filled 2012-04-21 (×5): qty 1

## 2012-04-21 MED ORDER — SODIUM CHLORIDE 0.9 % IV SOLN: 250.0000 mL | INTRAVENOUS | Status: DC | PRN

## 2012-04-21 MED ORDER — CALCITRIOL 0.25 MCG PO CAPS
0.2500 ug | ORAL_CAPSULE | Freq: Every day | ORAL | Status: DC
  Administered 2012-04-21 – 2012-04-25 (×5): 0.25 ug via ORAL
  Filled 2012-04-21 (×5): qty 1

## 2012-04-21 MED ORDER — ASPIRIN EC 81 MG PO TBEC
81.0000 mg | DELAYED_RELEASE_TABLET | Freq: Every day | ORAL | Status: DC
  Administered 2012-04-21 – 2012-04-25 (×5): 81 mg via ORAL
  Filled 2012-04-21 (×5): qty 1

## 2012-04-21 MED ORDER — GLIPIZIDE 10 MG PO TABS
10.0000 mg | ORAL_TABLET | Freq: Two times a day (BID) | ORAL | Status: DC
  Administered 2012-04-22 – 2012-04-25 (×8): 10 mg via ORAL
  Filled 2012-04-21 (×9): qty 1

## 2012-04-21 NOTE — H&P (Signed)
Rachel Hobbs is an 71 y.o. female.    Chief Complaint: shortness-of-breath  Assessment/Plan This is a 41 YOF with a history of hypertension, diabetes, and large abdominal wall hernia who presents with worsening shortness-of-breath over the past few days.   RESP/CV/RENAL # Dyspnea. BNP 721. CXR showing vascular congestion and mild cardiomegaly. We suspect she has CHF exacerbation, may be due to her missing her evening doses of Lasix.  # History of diastolic heart failure--ECHO 12/2011 EF 0000000, grade 2 diastolic dysfunction. She weighed 270 12/2011. Weight on admission 289.  # HTN # Stage 4 CKD not on dialysis--followed by Dr. Clover Mealy. Baseline Cr 3.4-3.8. -Continue home Coreg 12.5 bid, Norvasc 10, hydralazine tid. She is not on ACEi/ARB due to ESRD.  -Home Lasix is 160 in the AM and 80 in the PM>>>Increase to Lasix IV 100 bid -Daily weights, strict I/O (she would like to try bedpan not Foley at this time)  ENDO # History of diabetes--HgbA1c 7.5 (06/2011) -Continue home Lantus 35, ASA 81, atorvastatin 40, glipizide 10 bid -SSI  FEN/GI # Diet: carb-modified # IVF: SL  CODE: FULL  DISPO: Pending clinical improvement and ability to maintain oxygen saturations with ambulation without supplemental oxygen.  -Will check oxygen saturations in the AM with ambulation    HPI: This is a 61 YOF who presents with worsening shortness-of-breath over the past couple of days. She has a history of heart failure and is supposed to take Lasix twice a day. She has only been taking it in the morning over the past few days because she complains that it makes her urinate too frequently. She also endorses worsening leg swelling.  She denies chest pain, sore throat, muscle aches, cough. She endorses abdominal pressure without nausea, constipation, or decreased appetite. She is concerned that her breathing are due to her hernia. It has been growing for the past few years, ever since she had it repaired. She  denies significant abdominal pain. She presented to the [facility] ED yesterday afternoon around 2:30 pm. She feels better now than she did yesterday. She was transferred for admission because of her inability to maintain her oxygen saturations above 90%. She is not on oxygen at home.  She did not receive the flu shot this season.   When she is well, she is able to walk maybe a block before she gets significantly short-of-breath. Sometimes she uses a cane or walker.   Past Medical History  Diagnosis Date  . Depression   . Hyperlipidemia   . Hypertension   . Diabetes mellitus   . Hernia     Past Surgical History  Procedure Date  . Hernia repair   . Abdominal hysterectomy     Family History  Problem Relation Age of Onset  . Kidney disease Mother   . COPD Father     smoke  . Cancer Father     Lung  . Hypertension Father   . Cancer Brother     prostate  . Kidney disease Sister    Social History:  reports that she has never smoked. She has never used smokeless tobacco. She reports that she does not drink alcohol or use illicit drugs. Health Care POA:  Emergency Contact: daugher, [name], [phone] Who lives with you: Lives with daughter- 1 story home  Allergies: No Known Allergies  Medications Prior to Admission  Medication Sig Dispense Refill  . amLODipine (NORVASC) 10 MG tablet Take 1 tablet (10 mg total) by mouth daily.  90 tablet  3  . aspirin 81 MG tablet Take 81 mg by mouth daily.        Marland Kitchen atorvastatin (LIPITOR) 40 MG tablet Take 1 tablet (40 mg total) by mouth daily. For cholesterol.  90 tablet  2  . calcitRIOL (ROCALTROL) 0.25 MCG capsule Take 0.25 mcg by mouth daily.       . carvedilol (COREG) 12.5 MG tablet Take 12.5 mg by mouth 2 (two) times daily with a meal.      . docusate sodium (COLACE) 100 MG capsule Take 100 mg by mouth at bedtime.       . furosemide (LASIX) 80 MG tablet Take 80-160 mg by mouth 2 (two) times daily. 2 in the am 1 in the afternoon      . glipiZIDE  (GLUCOTROL) 10 MG tablet Take 1 tablet (10 mg total) by mouth 2 (two) times daily before a meal.  180 tablet  3  . hydrALAZINE (APRESOLINE) 50 MG tablet Take 1 tablet (50 mg total) by mouth 3 (three) times daily.  270 tablet  3  . insulin glargine (LANTUS) 100 UNIT/ML injection Inject 35-40 Units into the skin at bedtime.       . Multiple Vitamins-Minerals (ONE DAILY WOMENS) TABS Take by mouth daily.        Marland Kitchen trimethoprim-polymyxin b (POLYTRIM) ophthalmic solution Place 1 drop into both eyes every 4 (four) hours as needed. For runny eyes        Results for orders placed during the hospital encounter of 04/21/12 (from the past 48 hour(s))  CBC WITH DIFFERENTIAL     Status: Abnormal   Collection Time   04/21/12  4:15 AM      Component Value Range Comment   WBC 6.1  4.0 - 10.5 K/uL    RBC 3.52 (*) 3.87 - 5.11 MIL/uL    Hemoglobin 10.7 (*) 12.0 - 15.0 g/dL    HCT 34.3 (*) 36.0 - 46.0 %    MCV 97.4  78.0 - 100.0 fL    MCH 30.4  26.0 - 34.0 pg    MCHC 31.2  30.0 - 36.0 g/dL    RDW 15.8 (*) 11.5 - 15.5 %    Platelets 226  150 - 400 K/uL    Neutrophils Relative 76  43 - 77 %    Neutro Abs 4.6  1.7 - 7.7 K/uL    Lymphocytes Relative 13  12 - 46 %    Lymphs Abs 0.8  0.7 - 4.0 K/uL    Monocytes Relative 8  3 - 12 %    Monocytes Absolute 0.5  0.1 - 1.0 K/uL    Eosinophils Relative 2  0 - 5 %    Eosinophils Absolute 0.1  0.0 - 0.7 K/uL    Basophils Relative 0  0 - 1 %    Basophils Absolute 0.0  0.0 - 0.1 K/uL   POCT I-STAT, CHEM 8     Status: Abnormal   Collection Time   04/21/12  4:31 AM      Component Value Range Comment   Sodium 140  135 - 145 mEq/L    Potassium 4.9  3.5 - 5.1 mEq/L    Chloride 107  96 - 112 mEq/L    BUN 55 (*) 6 - 23 mg/dL    Creatinine, Ser 3.30 (*) 0.50 - 1.10 mg/dL    Glucose, Bld 232 (*) 70 - 99 mg/dL    Calcium, Ion 1.05 (*) 1.13 - 1.30 mmol/L  TCO2 24  0 - 100 mmol/L    Hemoglobin 11.2 (*) 12.0 - 15.0 g/dL    HCT 33.0 (*) 36.0 - 46.0 %   PRO B NATRIURETIC  PEPTIDE     Status: Abnormal   Collection Time   04/21/12  8:00 AM      Component Value Range Comment   Pro B Natriuretic peptide (BNP) 720.8 (*) 0 - 125 pg/mL   GLUCOSE, CAPILLARY     Status: Abnormal   Collection Time   04/21/12  2:59 PM      Component Value Range Comment   Glucose-Capillary 122 (*) 70 - 99 mg/dL    Ct Abdomen Pelvis Wo Contrast  04/21/2012  *RADIOLOGY REPORT*  Clinical Data: Left-sided abdominal swelling with known hernia.  CT ABDOMEN AND PELVIS WITHOUT CONTRAST  Technique:  Multidetector CT imaging of the abdomen and pelvis was performed following the standard protocol without intravenous contrast.  Comparison: 10/10/2008  Findings: There is a stable large left-sided abdominal wall hernia containing much of the stomach, several small bowel loops and a long segment of the transverse and descending colon.  No associated bowel obstruction, perforation or abscess is identified.  There is no free intraperitoneal fluid.  Unenhanced appearance of solid organs in the abdomen and pelvis is unremarkable.  Visualized lung bases show bibasilar atelectasis and small bilateral pleural effusions.  Some progression of thoracolumbar spondylosis is present since the prior CT in 2010.  IMPRESSION: Large left-sided abdominal wall hernia obtaining multiple bowel loops without evidence of complication.   Original Report Authenticated By: Aletta Edouard, M.D.    Dg Chest 2 View  04/21/2012  *RADIOLOGY REPORT*  Clinical Data: Shortness of breath.  CHEST - 2 VIEW  Comparison: Chest radiograph performed 01/03/2012  Findings: The lungs are well-aerated.  Vascular congestion is noted, with slightly increased interstitial markings, possibly reflecting minimal interstitial edema.  No pleural effusion or pneumothorax is seen.  The heart is mildly enlarged.  No acute osseous abnormalities are seen.  IMPRESSION: Vascular congestion and mild cardiomegaly, with slightly increased interstitial markings, possibly  reflecting minimal interstitial edema.   Original Report Authenticated By: Santa Lighter, M.D.     ROS Per HPI with inclusion of following.  Denies dysuria/urgency/frequency  Blood pressure 154/74, pulse 67, temperature 97.5 F (36.4 C), temperature source Oral, resp. rate 20, height 5\' 2"  (1.575 m), weight 289 lb 0.4 oz (131.1 kg), SpO2 100.00%. Physical Exam  Gen: NAD; well-appearing; morbidly obese PSYCH: engaged and normally conversant, appropriate to questions, alert and oriented HEENT:   Head: [place]/AT   Eyes: normal conjunctiva without injection or tearing   Nose: no rhinorrhea, normal turbinates   Mouth: MMM NECK: no LAD CV: RRR, normal S1/S2, no m/r/g PULM: NI WOB; wearing [place]; crackles heard half-way up right side of back; no crackles on left; good air movement; no wheezes or rales ABD: NABS, soft, NT; large soft hernia extending over large portion of left side of abdomen EXT: non-pitting edema; chronic venous stasis changes with thickened, darkened skin; no calf pain; 1+ pedal pulses bilaterally SKIN: warm, dry, no rash  NEURO: grossly intact, able to move all extremities   [place] PARK, Gianno Volner 04/21/2012, 4:23 PM

## 2012-04-21 NOTE — ED Notes (Signed)
[id number] Expected date:04/21/12<BR> Expected time: 2:26 AM<BR> Means of arrival:Ambulance<BR> Comments:<BR> Shortness of breath

## 2012-04-21 NOTE — ED Notes (Signed)
Brought in by EMS from home with c/o shortness of breath.  Per EMS, pt reported that she has been having SOB d/t her "hernia" pushing up her diaphragm.  Pt's O2 sat on room air was 97% upon EMS arrival at her home--- was placed on O2 at 2LPM via [place] d/t c/o SOB.

## 2012-04-21 NOTE — ED Notes (Addendum)
While ambulating pt pox 92-94 doe and sob rr 28-30 hr 70s. Pt had to take frequent breaks while walking

## 2012-04-21 NOTE — ED Provider Notes (Signed)
Report received from Rachel Neptune, NP at end of shift.  Pt presents c/o sob and leg swelling x 1 week.  Onset gradual, persistent, moderate in severity, worsening with exertion. No fever, cough, hemoptysis,cp, n/v/d, abd pain, leg pain, numbness or weakness.  Pt has left abdominal hernia for the past 3-4 years, and it has been getting progressively larger.  However she denies any new abd pain.  Her primary concern is SOB.  Does not use O2 at home.  On exam, pt is morbidly obese but appears to be in no acute resp distress, sats at 100% on 2L [place].  No JVD.  Heart S1S2, no M/R/G, lung with decreased breath sound without wheezes or rales. Abdomen with large left ventral hernia, nontender, not reducible.  Mid abdomen surgical scar.  Lower extremities with 1+ pitting edema bilaterally without palpable cords, erythema, or calves tenderness.    Patient with increased work of breath on ambulating. Her sats hovering around 91-92% on room air. She has to stop repeatedly to take extra breath.  Will obtain ECG and BNP.  Will considering having pt admitted by [facility] for further management.  Pt likely deconditioned from her recent viral infection.     8:07 AM Abd/pelvic CT shows large left-sided abdominal wall hernia without evidence of complication.  Abdomen nontender on exam.  Her ECG shows no ischemic changes.  ProBNP 720, appears unchanged.  CXR shows vascular congestion and minimal interstitial edema.  BUN 55, Cr 3.3 which are at her baseline.  Pt did have some uri sxs earlier in the week.   9:15 AM I have consulted with [facility] Dr. Jimmye Norman who agrees to admit pt to med surg, Under the care of attending Dr. Nori Riis for further management.  Will transfer pt to [place] currently stable for transfer.     Date: 04/21/2012  Rate: 77  Rhythm: normal sinus rhythm  QRS Axis: normal  Intervals: normal  ST/T Wave abnormalities: normal  Conduction Disutrbances:none  Narrative Interpretation:   Old EKG  Reviewed: unchanged  BP 152/65  Pulse 70  Temp 98.1 F (36.7 C) (Oral)  Resp 25  SpO2 98%  I have reviewed nursing notes and vital signs. I personally reviewed the imaging tests through PACS system  I reviewed available ER/hospitalization records thought the EMR  Results for orders placed during the hospital encounter of 04/21/12  CBC WITH DIFFERENTIAL      Component Value Range   WBC 6.1  4.0 - 10.5 K/uL   RBC 3.52 (*) 3.87 - 5.11 MIL/uL   Hemoglobin 10.7 (*) 12.0 - 15.0 g/dL   HCT 34.3 (*) 36.0 - 46.0 %   MCV 97.4  78.0 - 100.0 fL   MCH 30.4  26.0 - 34.0 pg   MCHC 31.2  30.0 - 36.0 g/dL   RDW 15.8 (*) 11.5 - 15.5 %   Platelets 226  150 - 400 K/uL   Neutrophils Relative 76  43 - 77 %   Neutro Abs 4.6  1.7 - 7.7 K/uL   Lymphocytes Relative 13  12 - 46 %   Lymphs Abs 0.8  0.7 - 4.0 K/uL   Monocytes Relative 8  3 - 12 %   Monocytes Absolute 0.5  0.1 - 1.0 K/uL   Eosinophils Relative 2  0 - 5 %   Eosinophils Absolute 0.1  0.0 - 0.7 K/uL   Basophils Relative 0  0 - 1 %   Basophils Absolute 0.0  0.0 - 0.1  K/uL  POCT I-STAT, CHEM 8      Component Value Range   Sodium 140  135 - 145 mEq/L   Potassium 4.9  3.5 - 5.1 mEq/L   Chloride 107  96 - 112 mEq/L   BUN 55 (*) 6 - 23 mg/dL   Creatinine, Ser 3.30 (*) 0.50 - 1.10 mg/dL   Glucose, Bld 232 (*) 70 - 99 mg/dL   Calcium, Ion 1.05 (*) 1.13 - 1.30 mmol/L   TCO2 24  0 - 100 mmol/L   Hemoglobin 11.2 (*) 12.0 - 15.0 g/dL   HCT 33.0 (*) 36.0 - 46.0 %  PRO B NATRIURETIC PEPTIDE      Component Value Range   Pro B Natriuretic peptide (BNP) 720.8 (*) 0 - 125 pg/mL   Ct Abdomen Pelvis Wo Contrast  04/21/2012  *RADIOLOGY REPORT*  Clinical Data: Left-sided abdominal swelling with known hernia.  CT ABDOMEN AND PELVIS WITHOUT CONTRAST  Technique:  Multidetector CT imaging of the abdomen and pelvis was performed following the standard protocol without intravenous contrast.  Comparison: 10/10/2008  Findings: There is a stable large left-sided  abdominal wall hernia containing much of the stomach, several small bowel loops and a long segment of the transverse and descending colon.  No associated bowel obstruction, perforation or abscess is identified.  There is no free intraperitoneal fluid.  Unenhanced appearance of solid organs in the abdomen and pelvis is unremarkable.  Visualized lung bases show bibasilar atelectasis and small bilateral pleural effusions.  Some progression of thoracolumbar spondylosis is present since the prior CT in 2010.  IMPRESSION: Large left-sided abdominal wall hernia obtaining multiple bowel loops without evidence of complication.   Original Report Authenticated By: Aletta Edouard, M.D.    Dg Chest 2 View  04/21/2012  *RADIOLOGY REPORT*  Clinical Data: Shortness of breath.  CHEST - 2 VIEW  Comparison: Chest radiograph performed 01/03/2012  Findings: The lungs are well-aerated.  Vascular congestion is noted, with slightly increased interstitial markings, possibly reflecting minimal interstitial edema.  No pleural effusion or pneumothorax is seen.  The heart is mildly enlarged.  No acute osseous abnormalities are seen.  IMPRESSION: Vascular congestion and mild cardiomegaly, with slightly increased interstitial markings, possibly reflecting minimal interstitial edema.   Original Report Authenticated By: Santa Lighter, M.D.       Domenic Moras, PA-C 04/21/12 [phone]

## 2012-04-21 NOTE — ED Provider Notes (Signed)
History     CSN: [id number]  Arrival date & time 04/21/12  [id number]   First MD Initiated Contact with Patient 04/21/12 [phone]      Chief Complaint  Patient presents with  . Shortness of Breath    (Consider location/radiation/quality/duration/timing/severity/associated sxs/prior treatment) HPI Comments: Patient with large abdominal mass L upper abdomen has had progressive SOB over the past several days also has edema in legs to knees   Patient is a 72 y.o. female presenting with shortness of breath. The history is provided by the patient.  Shortness of Breath  The current episode started 5 to 7 days ago. The problem has been gradually worsening. The problem is severe. Associated symptoms include shortness of breath. Pertinent negatives include no fever and no wheezing.    Past Medical History  Diagnosis Date  . Depression   . Hyperlipidemia   . Hypertension   . Diabetes mellitus   . Hernia     Past Surgical History  Procedure Date  . Hernia repair   . Abdominal hysterectomy     Family History  Problem Relation Age of Onset  . Kidney disease Mother   . COPD Father     smoke  . Cancer Father     Lung  . Hypertension Father   . Cancer Brother     prostate  . Kidney disease Sister     History  Substance Use Topics  . Smoking status: Never Smoker   . Smokeless tobacco: Never Used  . Alcohol Use: No    OB History    Grav Para Term Preterm Abortions TAB SAB Ect Mult Living                  Review of Systems  Constitutional: Negative for fever and chills.  Respiratory: Positive for shortness of breath. Negative for wheezing.   Cardiovascular: Positive for leg swelling.  Gastrointestinal: Positive for nausea and abdominal pain. Negative for vomiting.  Genitourinary: Negative for dysuria.  Musculoskeletal: Negative for back pain.  Skin: Negative for pallor and wound.  Neurological: Negative for weakness.    Allergies  Review of patient's allergies indicates no  known allergies.  Home Medications   No current outpatient prescriptions on file.  BP 161/68  Pulse 61  Temp 98.1 F (36.7 C) (Oral)  Resp 20  Ht 5\' 2"  (1.575 m)  Wt 289 lb 0.4 oz (131.1 kg)  BMI 52.86 kg/m2  SpO2 100%  Physical Exam  Constitutional: She is oriented to person, place, and time. She appears well-developed and well-nourished. She appears distressed.  HENT:  Head: Normocephalic.  Eyes: Pupils are equal, round, and reactive to light.  Neck: Normal range of motion.  Cardiovascular: Normal rate.   Pulmonary/Chest: Effort normal and breath sounds normal. No respiratory distress. She exhibits no tenderness.  Abdominal: She exhibits distension. Bowel sounds are decreased. There is tenderness.         mass  Musculoskeletal: She exhibits edema and tenderness.       Legs: Neurological: She is alert and oriented to person, place, and time.  Skin: Skin is warm. No rash noted. No erythema.    ED Course  Procedures (including critical care time)  Labs Reviewed  CBC WITH DIFFERENTIAL - Abnormal; Notable for the following:    RBC 3.52 (*)     Hemoglobin 10.7 (*)     HCT 34.3 (*)     RDW 15.8 (*)     All other components within normal limits  POCT I-STAT, CHEM 8 - Abnormal; Notable for the following:    BUN 55 (*)     Creatinine, Ser 3.30 (*)     Glucose, Bld 232 (*)     Calcium, Ion 1.05 (*)     Hemoglobin 11.2 (*)     HCT 33.0 (*)     All other components within normal limits  PRO B NATRIURETIC PEPTIDE - Abnormal; Notable for the following:    Pro B Natriuretic peptide (BNP) 720.8 (*)     All other components within normal limits  GLUCOSE, CAPILLARY - Abnormal; Notable for the following:    Glucose-Capillary 122 (*)     All other components within normal limits  GLUCOSE, CAPILLARY - Abnormal; Notable for the following:    Glucose-Capillary 176 (*)     All other components within normal limits  CBC - Abnormal; Notable for the following:    RBC 3.30 (*)      Hemoglobin 10.3 (*)     HCT 32.2 (*)     RDW 15.6 (*)     All other components within normal limits  CREATININE, SERUM - Abnormal; Notable for the following:    Creatinine, Ser 3.62 (*)     GFR calc non Af Amer 12 (*)     GFR calc Af Amer 14 (*)     All other components within normal limits  GLUCOSE, CAPILLARY - Abnormal; Notable for the following:    Glucose-Capillary 147 (*)     All other components within normal limits  LAB REPORT - SCANNED  BASIC METABOLIC PANEL   Ct Abdomen Pelvis Wo Contrast  04/21/2012  *RADIOLOGY REPORT*  Clinical Data: Left-sided abdominal swelling with known hernia.  CT ABDOMEN AND PELVIS WITHOUT CONTRAST  Technique:  Multidetector CT imaging of the abdomen and pelvis was performed following the standard protocol without intravenous contrast.  Comparison: 10/10/2008  Findings: There is a stable large left-sided abdominal wall hernia containing much of the stomach, several small bowel loops and a long segment of the transverse and descending colon.  No associated bowel obstruction, perforation or abscess is identified.  There is no free intraperitoneal fluid.  Unenhanced appearance of solid organs in the abdomen and pelvis is unremarkable.  Visualized lung bases show bibasilar atelectasis and small bilateral pleural effusions.  Some progression of thoracolumbar spondylosis is present since the prior CT in 2010.  IMPRESSION: Large left-sided abdominal wall hernia obtaining multiple bowel loops without evidence of complication.   Original Report Authenticated By: Aletta Edouard, M.D.    Dg Chest 2 View  04/21/2012  *RADIOLOGY REPORT*  Clinical Data: Shortness of breath.  CHEST - 2 VIEW  Comparison: Chest radiograph performed 01/03/2012  Findings: The lungs are well-aerated.  Vascular congestion is noted, with slightly increased interstitial markings, possibly reflecting minimal interstitial edema.  No pleural effusion or pneumothorax is seen.  The heart is mildly enlarged.   No acute osseous abnormalities are seen.  IMPRESSION: Vascular congestion and mild cardiomegaly, with slightly increased interstitial markings, possibly reflecting minimal interstitial edema.   Original Report Authenticated By: Santa Lighter, M.D.      1. Shortness of breath   2. Hypertension   3. Type II or unspecified type diabetes mellitus without mention of complication, not stated as uncontrolled       MDM          Garald Balding, NP 04/22/12 [phone]

## 2012-04-22 DIAGNOSIS — E118 Type 2 diabetes mellitus with unspecified complications: Secondary | ICD-10-CM

## 2012-04-22 DIAGNOSIS — I10 Essential (primary) hypertension: Secondary | ICD-10-CM

## 2012-04-22 DIAGNOSIS — I5023 Acute on chronic systolic (congestive) heart failure: Secondary | ICD-10-CM

## 2012-04-22 LAB — BASIC METABOLIC PANEL
BUN: 55 mg/dL — ABNORMAL HIGH (ref 6–23)
CO2: 25 mEq/L (ref 19–32)
Chloride: 105 mEq/L (ref 96–112)
GFR calc non Af Amer: 12 mL/min — ABNORMAL LOW (ref >90)
Glucose, Bld: 112 mg/dL — ABNORMAL HIGH (ref 70–99)
Potassium: 4.1 mEq/L (ref 3.5–5.1)

## 2012-04-22 LAB — GLUCOSE, CAPILLARY
Glucose-Capillary: 122 mg/dL — ABNORMAL HIGH (ref 70–99)
Glucose-Capillary: 108 mg/dL — ABNORMAL HIGH (ref 70–99)

## 2012-04-22 MED ORDER — DEXTROSE 50 % IV SOLN
INTRAVENOUS | Status: AC
  Administered 2012-04-22: 50 mL
  Filled 2012-04-22: qty 50

## 2012-04-22 NOTE — Progress Notes (Signed)
[facility] Progress Note  Patient name: Rachel Hobbs record number: [id number] Date of birth: May 31, 1940 Age: 72 y.o. Gender: female    LOS: 1 day   Primary Care Provider: Cletus Gash, MD  Overnight Events:  Peeing w/ lasix. Tolerating PO. No BM yet. Still SOB and feels like she still needs O2. LE swelling at baseline per pt.   Objective: Vital signs in last 24 hours: Temp:  [97.5 F (36.4 C)-98.7 F (37.1 C)] 98.7 F (37.1 C) (01/04 0607) Pulse Rate:  [61-67] 64  (01/04 0607) Resp:  [20-22] 22  (01/04 0607) BP: (130-161)/(58-74) 130/58 mmHg (01/04 0607) SpO2:  [96 %-100 %] 100 % (01/04 0607) Weight:  [289 lb 0.4 oz (131.1 kg)] 289 lb 0.4 oz (131.1 kg) (01/03 1340)  Wt Readings from Last 3 Encounters:  04/21/12 289 lb 0.4 oz (131.1 kg)  03/31/12 278 lb (126.1 kg)  12/22/11 270 lb (122.471 kg)     Current Facility-Administered Medications  Medication Dose Route Frequency Provider Last Rate Last Dose  . 0.9 %  sodium chloride infusion  250 mL Intravenous PRN Merlene Laughter Park, MD      . amLODipine (NORVASC) tablet 10 mg  10 mg Oral Daily Carolin Guernsey, MD   10 mg at 04/21/12 1903  . aspirin EC tablet 81 mg  81 mg Oral Daily Carolin Guernsey, MD   81 mg at 04/21/12 1904  . atorvastatin (LIPITOR) tablet 40 mg  40 mg Oral Daily Carolin Guernsey, MD   40 mg at 04/21/12 1903  . calcitRIOL (ROCALTROL) capsule 0.25 mcg  0.25 mcg Oral Daily [place], MD   0.25 mcg at 04/21/12 1904  . carvedilol (COREG) tablet 12.5 mg  12.5 mg Oral BID WC Carolin Guernsey, MD   12.5 mg at 04/22/12 [id number]  . docusate sodium (COLACE) capsule 100 mg  100 mg Oral QHS Merlene Laughter Park, MD   100 mg at 04/21/12 2230  . furosemide (LASIX) injection 80 mg  80 mg Intravenous [place], MD   80 mg at 04/22/12 [id number]  . glipiZIDE (GLUCOTROL) tablet 10 mg  10 mg Oral BID AC Merlene Laughter Park, MD      . heparin injection 5,000 Units  5,000 Units  Subcutaneous [place], MD   5,000 Units at 04/22/12 [id number]  . hydrALAZINE (APRESOLINE) tablet 50 mg  50 mg Oral TID Carolin Guernsey, MD   50 mg at 04/21/12 2230  . insulin aspart (novoLOG) injection 0-15 Units  0-15 Units Subcutaneous TID WC Merlene Laughter Park, MD      . insulin glargine (LANTUS) injection 35 Units  35 Units Subcutaneous QHS Carolin Guernsey, MD   35 Units at 04/21/12 2230  . sodium chloride 0.9 % injection 3 mL  3 mL Intravenous Q12H Merlene Laughter Park, MD      . sodium chloride 0.9 % injection 3 mL  3 mL Intravenous PRN Carolin Guernsey, MD         PE: BP 130/58  Pulse 64  Temp 98.7 F (37.1 C) (Oral)  Resp 22  Ht 5\' 2"  (1.575 m)  Wt 289 lb 0.4 oz (131.1 kg)  BMI 52.86 kg/m2  SpO2 100%  Gen: Mild distress, On O2 HEENT: mmm CV: RRR, no m/r/g Res: CTAB, on 2L on [place] Abd: NABS, Large soft hernia  extending over large portion of left side of abdomen Ext/Musc: 1+ pitting edema,  Neuro: CN grossly intact.   Labs/Studies:   Lab 04/22/12 0600 04/21/12 1751 04/21/12 0431  NA 142 -- 140  K 4.1 -- 4.9  CL 105 -- 107  CO2 25 -- --  GLUCOSE 112* -- 232*  BUN 55* -- 55*  CREATININE 3.61* 3.62* 3.30*  CALCIUM 8.5 -- --  MG -- -- --  PHOS -- -- --    Lab 04/21/12 1751 04/21/12 0431 04/21/12 0415  HGB 10.3* 11.2* 10.7*  HCT 32.2* 33.0* 34.3*  WBC 5.3 -- 6.1  PLT 216 -- 226    Lab 04/21/12 0800  PROBNP 720.8*      Assessment/Plan: This is a 63 YOF with a history of hypertension, diabetes, and large abdominal wall hernia who presents with worsening shortness-of-breath over the past few days.   CV: Likely w/ CHF exacerbation though mild as BNP 721. Not taking home QHS lasix on admission. CXR showing vascular congestion and mild cardiomegaly. History of diastolic heart failure--ECHO 12/2011 EF 0000000, grade 2 diastolic dysfunction. She weighed 270 12/2011. Weight on admission 289. Fluid status is up today so not acurately recording - No wt for today -  Strict I/O - Continue diuresis w/ Lasix 80 IV BID - Continue home Coreg 12.5 bid, Norvasc 10, hydralazine tid. She is not on ACEi/ARB due to ESRD.  - Consult Hrt Failure team today as pt w/ complication of Stage 4 Kidney disease - PT/OT  Res: likely fluid overloaded secondary to CHF. No home O2 requriement - Wean O2 as tolerated  Renal: Stage 4 CKD not on dialysis--followed by Dr. Clover Mealy. Baseline Cr 3.4-3.8, and at baseline on admission.  - Trend Cr - monitor UOP. - consult Renal if worsens  Endo: History of diabetes--HgbA1c 7.5 (06/2011). CBG elevated since admission -Continue home Lantus 35, ASA 81, atorvastatin 40, glipizide 10 bid  -SSI   FEN/GI: Tolerating PO - continue carb-modified diet - SLIV   CODE: FULL  DISPO: Pending clinical improvement and ability to maintain oxygen saturations with ambulation without supplemental oxygen.    LOS 1  Signed: Linna Darner, MD Family Medicine Resident PGY-2 [phone] 04/22/2012 8:02 AM

## 2012-04-22 NOTE — Evaluation (Signed)
Physical Therapy Evaluation Patient Details Name: Rachel Hobbs MRN: [id number] DOB: Jan 21, 1941 Today's Date: 04/22/2012 Time: [id number] PT Time Calculation (min): 32 min  PT Assessment / Plan / Recommendation Clinical Impression  Patient is a 72 yo female admitted with CHF exacerbation.  Patient with general weakness, DOE, and bil. LE edema impacting mobility.  Patient will benefit from acute PT to maximize independence prior to return home with daughter.    PT Assessment  Patient needs continued PT services    Follow Up Recommendations  Home health PT;Supervision - Intermittent    Does the patient have the potential to tolerate intense rehabilitation      Barriers to Discharge Decreased caregiver support      Equipment Recommendations  Rolling walker with 5" wheels    Recommendations for Other Services     Frequency Min 4X/week    Precautions / Restrictions Precautions Precautions: Fall Restrictions Weight Bearing Restrictions: No   Pertinent Vitals/Pain Dyspnea limiting mobility O2 sat at 92% with ambulation on room air.      Mobility  Bed Mobility Bed Mobility: Rolling Left;Supine to Sit;Sitting - Scoot to [employer] of Bed;Sit to Supine Rolling Left: 2: Max assist;With rail Supine to Sit: 2: Max assist;With rails;HOB elevated Sitting - Scoot to [employer] of Bed: 4: Min assist;With rail Sit to Supine: 3: Mod assist;HOB elevated Details for Bed Mobility Assistance: Verbal cues for technique.  Difficulty rolling due to size of bed and patient's size.  Unable to get to left side.  Use of rail to get to sitting.  Required assist to bring LE's onto bed to return to supine. Transfers Transfers: Sit to Stand;Stand to Sit Sit to Stand: 4: Min assist;With upper extremity assist;From bed Stand to Sit: 4: Min assist;With upper extremity assist;To bed Details for Transfer Assistance: Verbal cues for hand placement and safety. Ambulation/Gait Ambulation/Gait Assistance: 4: Min  assist Ambulation Distance (Feet): 32 Feet Assistive device: Rolling walker Ambulation/Gait Assistance Details: Verbal cues to stand upright.  Assist to maneuver RW during turns - instructed patient to take small steps during turns for safety. Gait Pattern: Step-through pattern;Decreased stride length;Trunk flexed;Shuffle Gait velocity: Slow gait speed. General Gait Details: Ambulation on room air - O2 sats at 92%.  Dyspnea 3/4           PT Diagnosis: Difficulty walking;Abnormality of gait;Generalized weakness  PT Problem List: Decreased strength;Decreased activity tolerance;Decreased balance;Decreased mobility;Decreased knowledge of use of DME;Cardiopulmonary status limiting activity;Obesity PT Treatment Interventions: DME instruction;Gait training;Stair training;Functional mobility training;Patient/family education   PT Goals Acute Rehab PT Goals PT Goal Formulation: With patient Time For Goal Achievement: 04/29/12 Potential to Achieve Goals: Good Pt will go Supine/Side to Sit: Independently PT Goal: Supine/Side to Sit - Progress: Goal set today Pt will go Sit to Supine/Side: Independently PT Goal: Sit to Supine/Side - Progress: Goal set today Pt will go Sit to Stand: with modified independence;with upper extremity assist PT Goal: Sit to Stand - Progress: Goal set today Pt will Ambulate: 51 - 150 feet;with modified independence;with rolling walker PT Goal: Ambulate - Progress: Goal set today Pt will Go Up / Down Stairs: 3-5 stairs;with min assist;with rail(s);with least restrictive assistive device PT Goal: Up/Down Stairs - Progress: Goal set today  Visit Information  Last PT Received On: 04/22/12 Assistance Needed: +1    Subjective Data  Subjective: "I just get so out of breath" Patient Stated Goal: To be able to return home   Prior Functioning  Home Living Lives With: Daughter Available Help  at Discharge: Family;Available PRN/intermittently (Daughter drives school bus  several hours 2x/day.) Type of Home: House Home Access: Stairs to enter Technical brewer of Steps: 3 Entrance Stairs-Rails: Right;Left Home Layout: One level Bathroom Shower/Tub: Chiropodist: Handicapped height Bathroom Accessibility: Yes Home Adaptive Equipment: Straight cane Prior Function Level of Independence: Independent (Occasionally uses cane) Able to Take Stairs?: Yes Driving: Yes Vocation: Retired Corporate investment banker: No difficulties    Cognition  Overall Cognitive Status: Appears within functional limits for tasks assessed/performed Arousal/Alertness: Awake/alert Orientation Level: Appears intact for tasks assessed Behavior During Session: [facility] for tasks performed    Extremity/Trunk Assessment Right Upper Extremity Assessment RUE ROM/Strength/Tone: WFL for tasks assessed RUE Sensation: WFL - Light Touch Left Upper Extremity Assessment LUE ROM/Strength/Tone: WFL for tasks assessed LUE Sensation: WFL - Light Touch Right Lower Extremity Assessment RLE ROM/Strength/Tone: Deficits RLE ROM/Strength/Tone Deficits: Significant edema throughout leg; Strength 4-/5 RLE Sensation: WFL - Light Touch Left Lower Extremity Assessment LLE ROM/Strength/Tone: Deficits LLE ROM/Strength/Tone Deficits: Significant edema throughout leg; Strength 4-/5 LLE Sensation: WFL - Light Touch   Balance    End of Session PT - End of Session Equipment Utilized During Treatment: Gait belt Activity Tolerance: Patient limited by fatigue (Dyspnea 3/4) Patient left: in bed;with call bell/phone within reach;with family/visitor present Nurse Communication: Mobility status (O2 sats remained at 92% with ambulation on room air)  GP     Despina Pole 04/22/2012, 4:10 PM Carita Pian. Sanjuana Kava, [place] Pager [phone]

## 2012-04-22 NOTE — Progress Notes (Signed)
I examined this patient and discussed the care plan with Dr Barbaraann Faster and the [facility] team and agree with assessment and plan as documented in the progress note above. She hasn't had a flu shot, so we should offer that to her

## 2012-04-22 NOTE — ED Provider Notes (Signed)
Medical screening examination/treatment/procedure(s) were performed by non-physician practitioner and as supervising physician I was immediately available for consultation/collaboration.   Wynetta Fines, MD 04/22/12 2009

## 2012-04-22 NOTE — Progress Notes (Signed)
Patient states that she does not wear CPAP at home.

## 2012-04-23 LAB — RENAL FUNCTION PANEL
Albumin: 2.9 g/dL — ABNORMAL LOW (ref 3.5–5.2)
BUN: 59 mg/dL — ABNORMAL HIGH (ref 6–23)
Chloride: 101 mEq/L (ref 96–112)
Creatinine, Ser: 3.87 mg/dL — ABNORMAL HIGH (ref 0.50–1.10)
GFR calc non Af Amer: 11 mL/min — ABNORMAL LOW (ref >90)
Phosphorus: 5.3 mg/dL — ABNORMAL HIGH (ref 2.3–4.6)

## 2012-04-23 LAB — GLUCOSE, CAPILLARY
Glucose-Capillary: 166 mg/dL — ABNORMAL HIGH (ref 70–99)
Glucose-Capillary: 120 mg/dL — ABNORMAL HIGH (ref 70–99)

## 2012-04-23 MED ORDER — FUROSEMIDE 10 MG/ML IJ SOLN
80.0000 mg | Freq: Three times a day (TID) | INTRAMUSCULAR | Status: DC
  Administered 2012-04-23: 80 mg via INTRAVENOUS
  Filled 2012-04-23: qty 8

## 2012-04-23 MED ORDER — INFLUENZA VIRUS VACC SPLIT PF IM SUSP
0.5000 mL | INTRAMUSCULAR | Status: AC
  Administered 2012-04-25: 0.5 mL via INTRAMUSCULAR
  Filled 2012-04-23: qty 0.5

## 2012-04-23 MED ORDER — FUROSEMIDE 10 MG/ML IJ SOLN: 80.0000 mg | Freq: Three times a day (TID) | INTRAMUSCULAR | Status: DC

## 2012-04-23 MED ORDER — LORATADINE 10 MG PO TABS
10.0000 mg | ORAL_TABLET | Freq: Every day | ORAL | Status: DC
  Administered 2012-04-23 – 2012-04-25 (×3): 10 mg via ORAL
  Filled 2012-04-23 (×3): qty 1

## 2012-04-23 MED ORDER — FUROSEMIDE 10 MG/ML IJ SOLN
80.0000 mg | Freq: Three times a day (TID) | INTRAMUSCULAR | Status: DC
  Administered 2012-04-23 (×2): 80 mg via INTRAVENOUS
  Filled 2012-04-23 (×5): qty 8

## 2012-04-23 NOTE — Progress Notes (Signed)
I examined this patient and discussed the care plan with Dr Venetia Maxon and the [facility] team and agree with assessment and plan as documented in the progress note above. Since she hasn't had the flu shot we should give it before she returns home.

## 2012-04-23 NOTE — Progress Notes (Signed)
[name] record number: [id number] Date of birth: 11-01-1940 Age: 72 y.o. Gender: female    LOS: 2 days   Primary Care Provider: Cletus Gash, MD  Subjective: Pt seen at bedside. States she feels "okay" this morning, still with some SOB but improved, doing okay without O2 but "feels better with it, sometimes." States her leg swelling is "a little better," as well. Otherwise few complaints. No fever, occasional chills that she gets "when she has trouble breathing."  Objective: Vital signs in last 24 hours: Temp:  [97.6 F (36.4 C)-98.1 F (36.7 C)] 98.1 F (36.7 C) (01/05 0413) Pulse Rate:  [62-69] 69  (01/05 0413) Resp:  [20] 20  (01/04 2020) BP: (154-174)/(57-66) 154/57 mmHg (01/05 0413) SpO2:  [92 %-100 %] 100 % (01/05 0413) Weight:  [289 lb (131.09 kg)] 289 lb (131.09 kg) (01/05 0413)  Wt Readings from Last 3 Encounters:  04/23/12 289 lb (131.09 kg)  03/31/12 278 lb (126.1 kg)  12/22/11 270 lb (122.471 kg)     Current Facility-Administered Medications  Medication Dose Route Frequency Provider Last Rate Last Dose  . 0.9 %  sodium chloride infusion  250 mL Intravenous PRN Merlene Laughter Park, MD      . amLODipine (NORVASC) tablet 10 mg  10 mg Oral Daily Carolin Guernsey, MD   10 mg at 04/22/12 1040  . aspirin EC tablet 81 mg  81 mg Oral Daily Carolin Guernsey, MD   81 mg at 04/22/12 1040  . atorvastatin (LIPITOR) tablet 40 mg  40 mg Oral Daily Carolin Guernsey, MD   40 mg at 04/22/12 1041  . calcitRIOL (ROCALTROL) capsule 0.25 mcg  0.25 mcg Oral Daily Merlene Laughter Park, MD   0.25 mcg at 04/22/12 1040  . carvedilol (COREG) tablet 12.5 mg  12.5 mg Oral BID WC Merlene Laughter Park, MD   12.5 mg at 04/23/12 0645  . docusate sodium (COLACE) capsule 100 mg  100 mg Oral QHS Merlene Laughter Park, MD   100 mg at 04/22/12 2218  . furosemide (LASIX) injection 80 mg  80 mg Intravenous TID [place], MD    80 mg at 04/23/12 0932  . glipiZIDE (GLUCOTROL) tablet 10 mg  10 mg Oral BID AC Merlene Laughter Park, MD   10 mg at 04/23/12 0645  . heparin injection 5,000 Units  5,000 Units Subcutaneous [place], MD   5,000 Units at 04/23/12 0645  . hydrALAZINE (APRESOLINE) tablet 50 mg  50 mg Oral TID Carolin Guernsey, MD   50 mg at 04/22/12 2218  . insulin aspart (novoLOG) injection 0-15 Units  0-15 Units Subcutaneous TID WC Merlene Laughter Park, MD      . insulin glargine (LANTUS) injection 35 Units  35 Units Subcutaneous QHS Carolin Guernsey, MD   35 Units at 04/22/12 2218  . sodium chloride 0.9 % injection 3 mL  3 mL Intravenous Q12H Merlene Laughter Park, MD      . sodium chloride 0.9 % injection 3 mL  3 mL Intravenous PRN Carolin Guernsey, MD         PE: BP 154/57  Pulse 69  Temp 98.1 F (36.7 C) (Oral)  Resp 20  Ht 5\' 2"  (1.575 m)  Wt 289 lb (131.09 kg)  BMI 52.86 kg/m2  SpO2 100% Gen: adult female, sitting  up at bedside in NAD, initially on O2 but comfortable throughout interview after removal of [place] HEENT: MMM, EOMI CV: RRR, no m/r/g Res: good air movement bilaterally with soft basilar crackles slightly worse on right than left; no wheezes Abd: BS+, soft and nontender; large soft hernia, left side of abdomen Ext/Musc: 1+ pitting edema, chronic skin changes with thickened skin, scaling bilaterally Neuro: CN grossly intact, no focal deficits  Labs/Studies:   Lab 04/23/12 0858 04/22/12 0600 04/21/12 1751 04/21/12 0431  NA 139 142 -- 140  K 4.9 4.1 -- --  CL 101 105 -- 107  CO2 27 25 -- --  GLUCOSE 160* 112* -- 232*  BUN 59* 55* -- 55*  CREATININE 3.87* 3.61* 3.62* 3.30*  CALCIUM 8.5 8.5 -- --  MG -- -- -- --  PHOS 5.3* -- -- --    Lab 04/21/12 1751 04/21/12 0431 04/21/12 0415  HGB 10.3* 11.2* 10.7*  HCT 32.2* 33.0* 34.3*  WBC 5.3 -- 6.1  PLT 216 -- 226    Lab 04/21/12 0800  PROBNP 720.8*    Assessment/Plan: This is a 72 YOF with a history of hypertension, diabetes, and  large abdominal wall hernia who presents with worsening shortness-of-breath over the past few days prior to admission. Admitted 1/3.  CV:  -CHF exacerbation though mild as BNP 721. Not taking home QHS lasix on admission. CXR showing vascular congestion and mild cardiomegaly. History of diastolic heart failure--ECHO 12/2011 EF 0000000, grade 2 diastolic dysfunction. She weighed 270 12/2011. Weight on admission 289. - Still with subjective complaints 1/5, HF complicated by CKD  -will consult HF team to see pt tomorrow (consult message left this AM, will follow up)  -will consider repeat echo and addition of Imdur, pending HF recommendations  -other management below - Latest weight still 289; continue daily weights - UOP net negative only 330 mL  -last Lasix dose 1500 1/4; UOP 900 mL last 24 hours  -clarified Lasix order: to continue 80 mg IV TID, for now   -Continue Strict I/O - Hx of HTN: Intermittent elevations.  -Continue home Coreg 12.5 bid, Norvasc 10, hydralazine TID  -Pt is not on ACEi/ARB due to ESRD.  - PT/OT recommending home health PT; will also consider home health RN for heart failure  Res: likely fluid overloaded secondary to CHF. No home O2 requriement - Wean O2 as tolerated; pt with good O2 sats even while ambulating, but "feels better" with [place] - Will offer flu shot prior to discharge  Renal: Stage 4 CKD not on dialysis--followed by Dr. Clover Mealy. Baseline Cr 3.4-3.8, and at baseline on admission.  -Cr this admission around baseline; last Cr 3.87 this morning - will check renal function daily and monitor UOP, as above - Will consider renal consult, given relatively aggressive diuresis  Endo: History of diabetes--HgbA1c 7.5 (06/2011). CBG elevated since admission -Continue home Lantus 35, ASA 81, atorvastatin 40, glipizide 10 bid  -SSI  FEN/GI: Tolerating PO - continue carb-modified diet - SLIV   CODE: FULL  DISPO: Pending clinical improvement and management above;  likely discharge home with home health PT/OT and possibly heart failure RN, possibly as early as tomorrow  LOS [place], MD PGY-1, [place] [facility] pager: [phone]

## 2012-04-24 LAB — GLUCOSE, CAPILLARY
Glucose-Capillary: 139 mg/dL — ABNORMAL HIGH (ref 70–99)
Glucose-Capillary: 80 mg/dL (ref 70–99)

## 2012-04-24 LAB — RENAL FUNCTION PANEL
BUN: 62 mg/dL — ABNORMAL HIGH (ref 6–23)
CO2: 24 mEq/L (ref 19–32)
Chloride: 95 mEq/L — ABNORMAL LOW (ref 96–112)
GFR calc Af Amer: 13 mL/min — ABNORMAL LOW (ref >90)
Glucose, Bld: 167 mg/dL — ABNORMAL HIGH (ref 70–99)
Potassium: 4.3 mEq/L (ref 3.5–5.1)
Sodium: 133 mEq/L — ABNORMAL LOW (ref 135–145)

## 2012-04-24 MED ORDER — FUROSEMIDE 80 MG PO TABS
160.0000 mg | ORAL_TABLET | Freq: Every day | ORAL | Status: DC
  Administered 2012-04-24 – 2012-04-25 (×2): 160 mg via ORAL
  Filled 2012-04-24 (×3): qty 2

## 2012-04-24 MED ORDER — ISOSORBIDE MONONITRATE ER 30 MG PO TB24
30.0000 mg | ORAL_TABLET | Freq: Every day | ORAL | Status: DC
  Administered 2012-04-24 – 2012-04-25 (×2): 30 mg via ORAL
  Filled 2012-04-24 (×2): qty 1

## 2012-04-24 MED ORDER — FUROSEMIDE 80 MG PO TABS
80.0000 mg | ORAL_TABLET | Freq: Every day | ORAL | Status: DC
  Administered 2012-04-24 – 2012-04-25 (×2): 80 mg via ORAL
  Filled 2012-04-24 (×2): qty 1

## 2012-04-24 NOTE — Progress Notes (Signed)
Occupational Therapy Evaluation Patient Details Name: Rachel Hobbs MRN: [id number] DOB: Aug 27, 1940 Today's Date: 04/24/2012 Time: 1410-1435 OT Time Calculation (min): 25 min  OT Assessment / Plan / Recommendation Clinical Impression  72yo with CHF. Completed ADL with pt with O2 SATs remianing >93 RA. Dyspnea 2/4. Pt states this is the amount of walking that she would have to do at home given her home set up. Educated pt on available AE to increase independence with LB ADL and also educated pt on E conservation techniques. Pt will benefit from skilled OT services to max independence with ADL and functional moiblity for ADL to facilitate D/C home with daughter and [place] services. Daughter can provide intermittent S, which is appropriate for D/C plan.    OT Assessment  Patient needs continued OT Services    Follow Up Recommendations  Home health OT    Barriers to Discharge None    Equipment Recommendations  Tub/shower seat    Recommendations for Other Services  none  Frequency  Min 2X/week    Precautions / Restrictions Precautions Precautions: None Restrictions Weight Bearing Restrictions: No   Pertinent Vitals/Pain No c/o pain    ADL  Eating/Feeding: Independent Where Assessed - Eating/Feeding: Chair Grooming: Modified independent Where Assessed - Grooming: Unsupported standing Upper Body Bathing: Set up Where Assessed - Upper Body Bathing: Supported sitting Lower Body Bathing: Minimal assistance Where Assessed - Lower Body Bathing: Supported sit to stand Upper Body Dressing: Set up Where Assessed - Upper Body Dressing: Unsupported sitting Lower Body Dressing: Minimal assistance Where Assessed - Lower Body Dressing: Supported sit to Lobbyist: Minimal assistance Toilet Transfer Method: Sit to Loss adjuster, chartered: Regular height toilet Toileting - Clothing Manipulation and Hygiene: Modified independent Where Assessed - Camera operator Manipulation  and Hygiene: Sit to stand from 3-in-1 or toilet Equipment Used: Gait belt;Rolling walker;Reacher;Sock aid;Long-handled sponge Transfers/Ambulation Related to ADLs: min A from lower surface ADL Comments: Educated pt on availability of AE to increase independence with LB ADL. Discussed E conservation. Handout given.    OT Diagnosis: Generalized weakness  OT Problem List: Decreased strength;Decreased activity tolerance;Decreased knowledge of use of DME or AE;Obesity;Increased edema OT Treatment Interventions: Self-care/ADL training;Therapeutic exercise;Energy conservation;DME and/or AE instruction;Therapeutic activities;Patient/family education   OT Goals Acute Rehab OT Goals OT Goal Formulation: With patient Time For Goal Achievement: 05/08/12 Potential to Achieve Goals: Good ADL Goals Pt Will Perform Lower Body Bathing: with set-up;Unsupported;with adaptive equipment;with cueing (comment type and amount) ADL Goal: Lower Body Bathing - Progress: Goal set today Pt Will Perform Lower Body Dressing: with set-up;with adaptive equipment;with cueing (comment type and amount);Unsupported ADL Goal: Lower Body Dressing - Progress: Goal set today Pt Will Transfer to Toilet: with supervision;Ambulation;with DME ADL Goal: Toilet Transfer - Progress: Goal set today Additional ADL Goal #1: Pt will verbalize 3 E conservation techniques for ADL.  ADL Goal: Additional Goal #1 - Progress: Goal set today  Visit Information  Last OT Received On: 04/25/11 Assistance Needed: +1    Subjective Data      Prior Functioning     Home Living Lives With: Daughter Available Help at Discharge: Family;Available PRN/intermittently (Daughter drives school bus several hours 2x/day.) Type of Home: House Home Access: Stairs to enter Technical brewer of Steps: 3 Entrance Stairs-Rails: Right;Left Home Layout: One level Bathroom Shower/Tub: Chiropodist: Handicapped height Bathroom  Accessibility: Yes Home Adaptive Equipment: Straight cane Prior Function Level of Independence: Independent (Occasionally uses cane) Able to Take Stairs?: Yes Driving: Yes  Vocation: Retired Corporate investment banker: No difficulties Dominant Hand: Right         Vision/Perception  WFL   Cognition  Overall Cognitive Status: Appears within functional limits for tasks assessed/performed Arousal/Alertness: Awake/alert Orientation Level: Appears intact for tasks assessed Behavior During Session: [facility] for tasks performed    Extremity/Trunk Assessment Right Upper Extremity Assessment RUE ROM/Strength/Tone: WFL for tasks assessed RUE Sensation: WFL - Light Touch;WFL - Proprioception RUE Coordination: WFL - gross/fine motor Left Upper Extremity Assessment LUE ROM/Strength/Tone: WFL for tasks assessed LUE Sensation: WFL - Light Touch;WFL - Proprioception LUE Coordination: WFL - gross/fine motor Right Lower Extremity Assessment RLE ROM/Strength/Tone: WFL for tasks assessed (edematous BLE) Left Lower Extremity Assessment LLE ROM/Strength/Tone: WFL for tasks assessed     Mobility Transfers Transfers: Sit to Stand;Stand to Sit Sit to Stand: 4: Min assist;With upper extremity assist;From chair/3-in-1 Stand to Sit: 4: Min assist;With upper extremity assist;To toilet               Balance  WFL   End of Session OT - End of Session Equipment Utilized During Treatment: Gait belt Activity Tolerance: Patient tolerated treatment well Patient left: in chair;with call bell/phone within reach Nurse Communication: Mobility status  GO     Tyishia Aune,HILLARY 04/24/2012, 2:44 PM [facility], OTR/L  [phone] 04/24/2012

## 2012-04-24 NOTE — Progress Notes (Signed)
Physical Therapy Treatment Patient Details Name: Rachel Hobbs MRN: [id number] DOB: 1941-04-10 Today's Date: 04/24/2012 Time: 0925-0950 PT Time Calculation (min): 25 min  PT Assessment / Plan / Recommendation Comments on Treatment Session  Patient may be candidate for home oxygen as she desaturates ambulating today on room air.  Demonstrated improved O2 sats during second ambulation attempt with O2 at 2 LPM.  Still needs RW for home use.    Follow Up Recommendations  Home health PT;Supervision - Intermittent                 Equipment Recommendations  Rolling walker with 5" wheels;Other (comment) (question need for home O2 (see note for O2 saturations))             Plan Discharge plan remains appropriate    Precautions / Restrictions Precautions Precautions: Fall Precaution Comments: oxygen dependent for activity (desaturated today to 86% ambulating on room air)   Pertinent Vitals/Pain Denies pain; see note for O2 sat qualifications for vitals during treatment today    Mobility  Bed Mobility Supine to Sit: 5: Supervision;HOB elevated Sitting - Scoot to Edge of Bed: 5: Supervision Transfers Sit to Stand: 4: Min assist;From chair/3-in-1;From toilet (supervision from bed, assist for low chair and toilet) Stand to Sit: 4: Min assist;To toilet;5: Supervision Ambulation/Gait Ambulation/Gait Assistance: 5: Supervision;4: Min guard Ambulation Distance (Feet): 85 Feet (second walk 70') Assistive device: Rolling walker Ambulation/Gait Assistance Details: stopped to rest briefly during first walk propping on forearms halfway through ambulation distance Gait Pattern: Shuffle;Step-through pattern;Decreased stride length    Exercises         PT Goals Acute Rehab PT Goals Pt will go Supine/Side to Sit: Independently PT Goal: Supine/Side to Sit - Progress: Progressing toward goal Pt will go Sit to Stand: with modified independence PT Goal: Sit to Stand - Progress: Progressing toward  goal Pt will Ambulate: 51 - 150 feet;with modified independence;with rolling walker PT Goal: Ambulate - Progress: Progressing toward goal  Visit Information  Last PT Received On: 04/24/12    Subjective Data  Subjective: Feel still little more short of breath than normal   Cognition  Overall Cognitive Status: Appears within functional limits for tasks assessed/performed Arousal/Alertness: Awake/alert Behavior During Session: [facility] for tasks performed         End of Session PT - End of Session Equipment Utilized During Treatment: Gait belt;Oxygen Activity Tolerance: Patient limited by fatigue Patient left: in chair;with call bell/phone within reach   GP     [facility] 04/24/2012, 10:24 AM Magda Kiel, [place] 04/24/2012

## 2012-04-24 NOTE — Progress Notes (Signed)
FMTS Attending Note Patient seen and examined by me today, discussed with Dr Venetia Maxon and rest of resident team and I agree with assessment/plan for today.  Patient endorses mild improvement from previous; plan to continue diuresis and monitoring of renal function.  CSW to address patient's home status and care, especially in light of concerns about possibility of lack of adherence to medical therapy that may have contributed to her decline and subsequent admission.  JB

## 2012-04-24 NOTE — Progress Notes (Deleted)
[facility]  Patient Status: Active (receiving services up to time of hospitalization)  [facility] is providing the following services: RN  Please note per home care nurse: Pt noncompliant with adm iv meds and removed PICC line herself without notifying md office or [facility].  Home situation questionable as pt reports her brother comes in strung out on crack and brings in strangers at all hours of the night.  If patient discharges after hours, please call [phone].   Pearletha Forge 04/24/2012, 6:11 PM

## 2012-04-24 NOTE — H&P (Signed)
FMTS Attending Admission Note: Xylina Rhoads MD [phone] pager office [phone] I  have seen and examined this patient, reviewed their chart. I have discussed this patient with the resident. I agree with the resident's findings, assessment and care plan. 

## 2012-04-24 NOTE — Progress Notes (Signed)
Physical Therapy Note  SATURATION QUALIFICATIONS: (This note is used to comply with regulatory documentation for home oxygen)  Patient Saturations on Room Air at Rest = 92%  Patient Saturations on Room Air while Ambulating = 86%  Patient Saturations on 2 Liters of oxygen while Ambulating = 95%  Please briefly explain why patient needs home oxygen: Patient with dyspnea with exertion and unable to ambulate household distances (85') on room air without hypoxemia.  [place], [place] 04/24/2012

## 2012-04-24 NOTE — Care Management Note (Addendum)
    Page 1 of 2   04/25/2012     3:02:19 PM   CARE MANAGEMENT NOTE 04/25/2012  Patient:  SORIAH, DENHOLM   Account Number:  [id number]  Date Initiated:  04/24/2012  Documentation initiated by:  Elissa Hefty  Subjective/Objective Assessment:   adm w shortness of breath     Action/Plan:   lives w da, pcp dr Cletus Gash   Anticipated DC Date:  04/24/2012   Anticipated DC Plan:  [place]  CM consult      [place]   Choice offered to / List presented to:  C-1 Patient   DME arranged  [facility]      DME agency  [place] arranged  HH-1 RN  [place].   Status of service:   Medicare Important Message given?   (If response is "NO", the following Medicare IM given date fields will be blank) Date Medicare IM given:   Date Additional Medicare IM given:    Discharge Disposition:  [place]  Per UR Regulation:    If discussed at Long Length of Stay Meetings, dates discussed:    Comments:  1/7 1500 debbie Allison Silva rn,bsn dr street had told nse veronica that he does want home o2. [facility] w justin at [facility] and he will [place] to room.  1/6 1638 debbie Maceo Hernan rn,bsn spoke w pt, went over list of hhc agencies. no pref. ref to [facility] for rn and sw.

## 2012-04-24 NOTE — Progress Notes (Signed)
[facility] Progress Note  Patient name: Rachel Hobbs record number: [id number] Date of birth: [birth year] Age: 72 y.o. Gender: female    LOS: 3 days   Primary Care Provider: Cletus Gash, MD  Subjective: Pt seen at bedside. States she feels "okay" this morning. Still with some SOB and desats while ambulating with PT. Pt states swelling her legs is "maybe a little better," but does complain of some burning pain in her legs. Otherwise feels okay and states that her breathing is "better but not normal." Denies pain, N/V, fever.  Objective: Vital signs in last 24 hours: Temp:  [98.6 F (37 C)-99.2 F (37.3 C)] 98.6 F (37 C) (01/06 0557) Pulse Rate:  [68-70] 69  (01/06 0557) Resp:  [18-20] 20  (01/06 0557) BP: (140-163)/(60-69) 140/69 mmHg (01/06 0557) SpO2:  [93 %-96 %] 96 % (01/06 0557) Weight:  [288 lb 5.8 oz (130.8 kg)] 288 lb 5.8 oz (130.8 kg) (01/06 0557)  Wt Readings from Last 3 Encounters:  04/24/12 288 lb 5.8 oz (130.8 kg)  03/31/12 278 lb (126.1 kg)  12/22/11 270 lb (122.471 kg)     Current Facility-Administered Medications  Medication Dose Route Frequency Provider Last Rate Last Dose  . 0.9 %  sodium chloride infusion  250 mL Intravenous PRN Merlene Laughter [place], MD      . amLODipine (NORVASC) tablet 10 mg  10 mg Oral Daily Carolin Guernsey, MD   10 mg at 04/23/12 1041  . aspirin EC tablet 81 mg  81 mg Oral Daily Carolin Guernsey, MD   81 mg at 04/23/12 1041  . atorvastatin (LIPITOR) tablet 40 mg  40 mg Oral Daily Carolin Guernsey, MD   40 mg at 04/23/12 1041  . calcitRIOL (ROCALTROL) capsule 0.25 mcg  0.25 mcg Oral Daily Grandfather, MD   0.25 mcg at 04/23/12 1041  . carvedilol (COREG) tablet 12.5 mg  12.5 mg Oral BID WC Carolin Guernsey, MD   12.5 mg at 04/24/12 0641  . docusate sodium (COLACE) capsule 100 mg  100 mg Oral QHS Merlene Laughter [place], MD   100 mg at 04/23/12 2144  . furosemide (LASIX) injection 80 mg  80 mg  Intravenous TID [place], MD   80 mg at 04/23/12 2028  . glipiZIDE (GLUCOTROL) tablet 10 mg  10 mg Oral BID AC Merlene Laughter [place], MD   10 mg at 04/24/12 0641  . heparin injection 5,000 Units  5,000 Units Subcutaneous Q8H Carolin Guernsey, MD   5,000 Units at 04/24/12 [phone]  . hydrALAZINE (APRESOLINE) tablet 50 mg  50 mg Oral TID Carolin Guernsey, MD   50 mg at 04/23/12 2144  . influenza  inactive virus vaccine (FLUZONE/FLUARIX) injection 0.5 mL  0.5 mL Intramuscular Tomorrow-1000 Schuyler Amor, MD      . insulin aspart (novoLOG) injection 0-15 Units  0-15 Units Subcutaneous TID WC Carolin Guernsey, MD   2 Units at 04/24/12 [phone]  . insulin glargine (LANTUS) injection 35 Units  35 Units Subcutaneous QHS Carolin Guernsey, MD   35 Units at 04/23/12 2144  . loratadine (CLARITIN) tablet 10 mg  10 mg Oral Daily Piedra Aguza, MD   10 mg at 04/23/12 1601  . sodium chloride 0.9 % injection 3 mL  3 mL Intravenous Q12H Carolin Guernsey, MD   3 mL at 04/23/12 2145  .  sodium chloride 0.9 % injection 3 mL  3 mL Intravenous PRN Carolin Guernsey, MD         PE: BP 140/69  Pulse 69  Temp 98.6 F (37 C) (Oral)  Resp 20  Ht 5\' 2"  (1.575 m)  Wt 288 lb 5.8 oz (130.8 kg)  BMI 52.74 kg/m2  SpO2 96% Gen: adult female, sitting up at bedside in NAD, initially on O2 but comfortable throughout interview after removal of [place] HEENT: MMM, EOMI CV: RRR, no murmur appreciated Res: good air movement bilaterally with bibasilar crackles, little changed Abd: BS+, soft and nontender; large soft hernia, left side of abdomen Ext/Musc: 1+ pitting edema, chronic skin changes with thickened skin, scaling bilaterally; little change from previous days Neuro: CN grossly intact, no focal deficits  Labs/Studies:   Lab 04/24/12 0426 04/23/12 0858 04/22/12 0600 04/21/12 1751 04/21/12 0431  NA 133* 139 142 -- 140  K 4.3 4.9 -- -- --  CL 95* 101 105 -- 107  CO2 24 27 25  -- --  GLUCOSE 167* 160* 112* -- 232*    BUN 62* 59* 55* -- 55*  CREATININE 3.86* 3.87* 3.61* 3.62* 3.30*  CALCIUM 8.9 8.5 8.5 -- --  MG -- -- -- -- --  PHOS 5.0* 5.3* -- -- --    Lab 04/21/12 1751 04/21/12 0431 04/21/12 0415  HGB 10.3* 11.2* 10.7*  HCT 32.2* 33.0* 34.3*  WBC 5.3 -- 6.1  PLT 216 -- 226    Lab 04/21/12 0800  PROBNP 720.8*    Assessment/Plan: This is a 72 YOF with a history of hypertension, diabetes, and large abdominal wall hernia who presents with worsening shortness-of-breath over the past few days prior to admission. Admitted 1/3.  CV:  -CHF exacerbation though mild as BNP 721. Not taking home QHS lasix on admission. CXR showing vascular congestion and mild cardiomegaly. History of diastolic heart failure--ECHO 12/2011 EF 0000000, grade 2 diastolic dysfunction. She weighed 270 12/2011. Weight on admission 289. - Still with subjective complaints 1/6 and objective desats, HF complicated by CKD  -discussed management with HF team PA [phone]); will consider formal consult   -will start Imdur 30 mg daily; may consider metolazone 2.5 mg per day for synergy with Lasix   -will consider repeat echo    -unlikely new cardiac event or worsening function given med noncompliance    -troponin negative x1  -other management below - Latest weight still 289; continue daily weights - UOP net negative only 330 mL  -last Lasix dose 1500 1/4; UOP 900 mL last 24 hours  -clarified Lasix order: to continue 80 mg IV TID, for now   -Continue Strict I/O - Hx of HTN: Intermittent elevations.  -Continue home Coreg 12.5 bid, Norvasc 10, hydralazine TID  -Pt is not on ACEi/ARB due to ESRD.  - PT/OT recommending home health PT; will also consider home health RN for heart failure  Res: likely fluid overloaded secondary to CHF. No home O2 requriement - Some desats today with ambulation; will monitor and may order HH oxygen - Wean O2 as tolerated; pt with good O2 sats even while ambulating, but "feels better" with [place] - Will  offer flu shot prior to discharge  Renal: Stage 4 CKD not on dialysis--followed by Dr. Clover Mealy. Baseline Cr 3.4-3.8, and at baseline on admission.  -Cr this admission around baseline; Cr stable around upper range of pt's baseline - will check renal function daily and monitor UOP, as above - Will consider renal consult,  given relatively aggressive diuresis, especially if pt's renal function declines  Endo: History of diabetes--HgbA1c 7.5 in March, 8.7 in September. CBG elevated since admission -under better control with current management; will recheck A1c -Continue home Lantus 35, SSI; also continue ASA 81, atorvastatin 40, glipizide 10 bid  FEN/GI: Tolerating PO - continue carb-modified diet - SLIV   CODE: FULL  DISPO: Pending clinical improvement and management above.  Home health being arranged; will consider [facility] RN for med management and/or heart failure management  Given med non-compliance contributing to this admission, as well as uncertain home situation/support system, will consult social work  Assistance with management and home needs greatly appreciated!  [place], MD PGY-1, [place] Intern pager: [phone]

## 2012-04-25 LAB — RENAL FUNCTION PANEL
Albumin: 3.1 g/dL — ABNORMAL LOW (ref 3.5–5.2)
Calcium: 9 mg/dL (ref 8.4–10.5)
Chloride: 97 mEq/L (ref 96–112)
Creatinine, Ser: 4.03 mg/dL — ABNORMAL HIGH (ref 0.50–1.10)
GFR calc non Af Amer: 10 mL/min — ABNORMAL LOW (ref >90)
Sodium: 137 mEq/L (ref 135–145)

## 2012-04-25 LAB — GLUCOSE, CAPILLARY
Glucose-Capillary: 109 mg/dL — ABNORMAL HIGH (ref 70–99)
Glucose-Capillary: 189 mg/dL — ABNORMAL HIGH (ref 70–99)

## 2012-04-25 MED ORDER — ISOSORBIDE MONONITRATE ER 30 MG PO TB24: 30.0000 mg | ORAL_TABLET | Freq: Every day | ORAL | Status: DC

## 2012-04-25 MED ORDER — FUROSEMIDE 80 MG PO TABS: 80.0000 mg | ORAL_TABLET | Freq: Two times a day (BID) | ORAL | Status: DC

## 2012-04-25 NOTE — Progress Notes (Signed)
Physical Therapy Treatment Patient Details Name: Rachel Hobbs MRN: [id number] DOB: 1941-03-29 Today's Date: 04/25/2012 Time: [id number] PT Time Calculation (min): 26 min  PT Assessment / Plan / Recommendation Comments on Treatment Session  Pt ambulated in hallway with SaO2 92% on room air.  Pt performed exercises in recliner and then ambulated once more in hallway with SaO2 96% room air upon returning to recliner.  Pt required short rest breaks due to SOB/fatigue after ambulating and between exercises.    Follow Up Recommendations  Home health PT;Supervision - Intermittent     Does the patient have the potential to tolerate intense rehabilitation     Barriers to Discharge        Equipment Recommendations  Rolling walker with 5" wheels (wide)    Recommendations for Other Services    Frequency     Plan Discharge plan remains appropriate;Frequency remains appropriate    Precautions / Restrictions Precautions Precautions: None   Pertinent Vitals/Pain No Pain, see ambulation section for SaO2     Mobility  Bed Mobility Bed Mobility: Not assessed Transfers Transfers: Stand to Sit;Sit to Stand Sit to Stand: 4: Min assist;With upper extremity assist;With armrests;From chair/3-in-1 Stand to Sit: To chair/3-in-1;4: Min guard Details for Transfer Assistance: verbal cues for safety, initially min A from recliner however min/guard from [facility] Ambulation/Gait Ambulation/Gait Assistance: 5: Supervision Ambulation Distance (Feet): 140 Feet (total) Assistive device: Rolling walker Ambulation/Gait Assistance Details: pt ambulated 60 feet with rest break halfway and SaO2 92% room air at lowest during ambulation, pt ambulated again after exercises and SaO2 96% room air upon returning to recliner Gait Pattern: Trunk flexed;Decreased stride length;Step-through pattern Gait velocity: decreased General Gait Details: dyspnea 2/4    Exercises General Exercises - Lower Extremity Ankle Circles/Pumps:  AROM;Both;15 reps;Seated Long Arc Quad: AROM;Strengthening;Both;15 reps;Seated Heel Slides: AROM;Strengthening;Both;15 reps;Supine Hip ABduction/ADduction: AROM;Strengthening;Both;10 reps;Supine Straight Leg Raises: AROM;Strengthening;Both;AAROM;10 reps;Supine;Other (comment) (L AAROM due to abdominal hernia) Hip Flexion/Marching: AROM;Strengthening;Both;15 reps;Seated   PT Diagnosis:    PT Problem List:   PT Treatment Interventions:     PT Goals Acute Rehab PT Goals PT Goal: Sit to Stand - Progress: Progressing toward goal PT Goal: Ambulate - Progress: Progressing toward goal  Visit Information  Last PT Received On: 04/25/12 Assistance Needed: +1    Subjective Data  Subjective: How does it look? (sats with ambulation)   Cognition  Overall Cognitive Status: Appears within functional limits for tasks assessed/performed Arousal/Alertness: Awake/alert Orientation Level: Appears intact for tasks assessed Behavior During Session: [facility] for tasks performed    Balance     End of Session PT - End of Session Activity Tolerance: Patient limited by fatigue Patient left: in chair;with call bell/phone within reach Nurse Communication: Other (comment) (sats with ambulation)   GP     Maecy Podgurski,KATHrine E 04/25/2012, 2:39 PM Pager: [phone]

## 2012-04-25 NOTE — Progress Notes (Signed)
[facility] Progress Note  Patient name: Rachel Hobbs record number: [id number] Date of birth: 1941/01/19 Age: 72 y.o. Gender: female    LOS: 4 days   Primary Care Provider: Cletus Gash, MD  Subjective: Pt seen at bedside. Better subjectively from a SOB and LE swelling standpoint. More comfortable with going home today. Some concerns about home health arrangements, but receptive to having home health. No chest pain, abdominal pain, N/V, or fevers.  Objective: Vital signs in last 24 hours: Temp:  [98.4 F (36.9 C)-99.1 F (37.3 C)] 98.7 F (37.1 C) (01/07 0554) Pulse Rate:  [73-75] 74  (01/07 0809) Resp:  [18-20] 18  (01/07 0554) BP: (153-166)/(46-58) 166/58 mmHg (01/07 1056) SpO2:  [96 %-100 %] 98 % (01/07 0554) Weight:  [284 lb 9.8 oz (129.1 kg)] 284 lb 9.8 oz (129.1 kg) (01/07 0622)  Wt Readings from Last 3 Encounters:  04/25/12 284 lb 9.8 oz (129.1 kg)  03/31/12 278 lb (126.1 kg)  12/22/11 270 lb (122.471 kg)     Current Facility-Administered Medications  Medication Dose Route Frequency Provider Last Rate Last Dose  . 0.9 %  sodium chloride infusion  250 mL Intravenous PRN Merlene Laughter [place], MD      . amLODipine (NORVASC) tablet 10 mg  10 mg Oral Daily Merlene Laughter [place], MD   10 mg at 04/25/12 1056  . aspirin EC tablet 81 mg  81 mg Oral Daily Carolin Guernsey, MD   81 mg at 04/25/12 1056  . atorvastatin (LIPITOR) tablet 40 mg  40 mg Oral Daily Carolin Guernsey, MD   40 mg at 04/25/12 1056  . calcitRIOL (ROCALTROL) capsule 0.25 mcg  0.25 mcg Oral Daily Merlene Laughter [place], MD   0.25 mcg at 04/25/12 1056  . carvedilol (COREG) tablet 12.5 mg  12.5 mg Oral BID WC Merlene Laughter [place], MD   12.5 mg at 04/25/12 0809  . docusate sodium (COLACE) capsule 100 mg  100 mg Oral QHS Merlene Laughter [place], MD   100 mg at 04/24/12 2232  . furosemide (LASIX) tablet 160 mg  160 mg Oral Daily [address], MD   160 mg at 04/25/12 0809  . furosemide (LASIX)  tablet 80 mg  80 mg Oral Daily [place], MD   80 mg at 04/24/12 1642  . glipiZIDE (GLUCOTROL) tablet 10 mg  10 mg Oral BID AC Merlene Laughter [place], MD   10 mg at 04/25/12 0636  . heparin injection 5,000 Units  5,000 Units Subcutaneous Q8H Carolin Guernsey, MD   5,000 Units at 04/25/12 [phone]  . hydrALAZINE (APRESOLINE) tablet 50 mg  50 mg Oral TID Carolin Guernsey, MD   50 mg at 04/25/12 1056  . insulin aspart (novoLOG) injection 0-15 Units  0-15 Units Subcutaneous TID WC Carolin Guernsey, MD   2 Units at 04/24/12 1643  . insulin glargine (LANTUS) injection 35 Units  35 Units Subcutaneous QHS Carolin Guernsey, MD   35 Units at 04/24/12 2233  . isosorbide mononitrate (IMDUR) 24 hr tablet 30 mg  30 mg Oral Daily [place], MD   30 mg at 04/25/12 1056  . loratadine (CLARITIN) tablet 10 mg  10 mg Oral Daily [place], MD   10 mg at 04/25/12 1057  . sodium chloride 0.9 % injection 3 mL  3 mL Intravenous Q12H Carolin Guernsey, MD  3 mL at 04/25/12 1056  . sodium chloride 0.9 % injection 3 mL  3 mL Intravenous PRN Carolin Guernsey, MD         PE: BP 166/58  Pulse 74  Temp 98.7 F (37.1 C) (Oral)  Resp 18  Ht 5\' 2"  (1.575 m)  Wt 284 lb 9.8 oz (129.1 kg)  BMI 52.06 kg/m2  SpO2 98% Gen: adult female, lying in bedside in NAD on room air, moves without assistance HEENT: MMM, EOMI CV: RRR, no murmur appreciated Res: good air movement bilaterally with bibasilar crackles, little changed Abd: BS+, soft and nontender; large soft hernia, left side of abdomen Ext/Musc: trace-1+ pitting edema, chronic skin changes with thickened skin, scaling bilaterally; slightly improved Neuro: CN grossly intact, no focal deficits  Labs/Studies:   Lab 04/25/12 0515 04/24/12 0426 04/23/12 0858 04/22/12 0600 04/21/12 1751 04/21/12 0431  NA 137 133* 139 142 -- 140  K 4.5 4.3 -- -- -- --  CL 97 95* 101 105 -- 107  CO2 26 24 27 25  -- --  GLUCOSE 127* 167* 160* 112* -- 232*  BUN 66* 62*  59* 55* -- 55*  CREATININE 4.03* 3.86* 3.87* 3.61* 3.62* --  CALCIUM 9.0 8.9 8.5 8.5 -- --  MG -- -- -- -- -- --  PHOS 4.6 5.0* 5.3* -- -- --    Lab 04/21/12 1751 04/21/12 0431 04/21/12 0415  HGB 10.3* 11.2* 10.7*  HCT 32.2* 33.0* 34.3*  WBC 5.3 -- 6.1  PLT 216 -- 226    Lab 04/21/12 0800  PROBNP 720.8*    Assessment/Plan: This is a 72 YOF with a history of hypertension, diabetes, and large abdominal wall hernia who presents with worsening shortness-of-breath over the past few days prior to admission. Admitted 1/3.  CV:  -CHF exacerbation though mild as BNP 721. Not taking home QHS lasix on admission. CXR showing vascular congestion and mild cardiomegaly. History of diastolic heart failure--ECHO 12/2011 EF 0000000, grade 2 diastolic dysfunction. She weighed 270 12/2011. Weight on admission 289. -Still with subjective complaints 1/6 and objective desats, improved 1/7 -HF complicated by CKD  -discussed management with HF team PA [phone]); will consider formal consult   -started Imdur 30 mg daily; may consider metolazone 2.5 mg per day for synergy with Lasix   -considered repeat echo    -unlikely new cardiac event or worsening function given med noncompliance    -troponin negative x1  -pt to be referred to HF clinic through home RN; other management below - Latest weight still 284, down 5 pounds from admission; continue daily weights - UOP net negative 2.3L  -Lasix to PO 1/6; adjusted times to 8 AM (160 mg) and 2 PM (80 mg) for better compliance   -Continue Strict I/O - Hx of HTN: Intermittent elevations.  -Continue home Coreg 12.5 bid, Norvasc 10, hydralazine TID  -Pt is not on ACEi/ARB due to ESRD.  - PT/OT recommending home health PT; also ordered for home health RN for heart failure  Res: likely fluid overloaded secondary to CHF. No home O2 requriement - Some desats 1/6 with ambulation; ordered for HH oxygen - Wean O2 as tolerated; pt with good O2 sats even while  ambulating, but "feels better" with [place] - Will offer flu shot prior to discharge  Renal: Stage 4 CKD not on dialysis--followed by Dr. Clover Mealy. Baseline Cr 3.4-3.8, and at baseline on admission.  -Cr this admission around baseline; Cr with slight increase to 4.03, likely attributable to aggressive  diuresis - Considered inpt renal consult but given pt with good response to PO diuretics and improved status, as well as known stage IV CKD, doubt any large change in management; will set up close outpt follow up with Dr. Clover Mealy  Endo: History of diabetes--HgbA1c 7.5 in March, 8.7 in September. CBG elevated since admission -under better control with current management; recheck A1c 7.8 -Continue home Lantus 35, SSI; also continue ASA 81, atorvastatin 40, glipizide 10 bid  FEN/GI: Tolerating PO - continue carb-modified diet - SLIV   CODE: FULL  DISPO: Pending clinical improvement and management above. Probable discharge home today  Home health being arranged; also ordered [facility] RN for med management and heart failure management  Social work consulted, given med non-compliance contributing to this admission, as well as uncertain home situation/support system; will touch base with them prior to discharge (inpt eval vs HH CSW)  Assistance with management and home needs greatly appreciated!  LOS [place], MD PGY-1, [place] Intern pager: [phone]

## 2012-04-25 NOTE — Discharge Summary (Signed)
Hospital Discharge Summary  Patient name: Rachel Hobbs Medical record number: [id number] Date of birth: 06-Dec-1940 Age: 72 y.o. Gender: female Date of Admission: 04/21/2012  Date of Discharge: 04/25/2012 Admitting Physician: Dickie La, MD  Primary Care Provider: Cletus Gash, MD  Indication for Hospitalization: acutely worsening dyspnea Discharge Diagnoses:  Acute on chronic diastolic heart failure Diabetes mellitus Chronic renal insufficiency Dyspnea HTN Abdominal incisional hernia Bilateral leg edema  Consultations: none (informal heart failure team consultation)  Significant Labs and Imaging:  Admit CBC: WBC 6.1, Hb 10.7, Plt 226 Pro-BNP on admission 720.8 Cr trend: 3.3 on admission > 3.62 > 3.61 > 3.87 > 3.86 > 4.03  Hemoglobin A1c, 1/6: 7.8  Imaging: CXR, 1/3 @0317  Findings: The lungs are well-aerated. Vascular congestion is  noted, with slightly increased interstitial markings, possibly  reflecting minimal interstitial edema. No pleural effusion or  pneumothorax is seen.  The heart is mildly enlarged. No acute osseous abnormalities are  seen.  IMPRESSION:  Vascular congestion and mild cardiomegaly, with slightly increased  interstitial markings, possibly reflecting minimal interstitial  edema.  CT, Abd/Pelvis 1/3 @0711  IMPRESSION:  Large left-sided abdominal wall hernia obtaining multiple bowel  loops without evidence of complication.  Procedures: none  Brief Hospital Course: This is a 3 YOF with a history of hypertension, diabetes, and large abdominal wall hernia who presents with worsening shortness-of-breath over the past few days prior to admission; exacerbation of symptoms likely related to poor compliance with Lasix. Admitted 1/3. Pt had improvement of subjective complaints with Lasix therapy and was weaned off continuous oxygen; she did have occasional desats while ambulating. See below for details by problem  list.  CARDIOVASCULAR:  -CHF exacerbation: mild as BNP only 721. Was not taking home PM Lasix on admission. CXR showing vascular congestion and mild cardiomegaly. History of diastolic heart failure--ECHO 12/2011 EF 0000000, grade 2 diastolic dysfunction. She weighed 270 12/2011. Weight on admission 289, down to 284 at time of discharge.   -Still with subjective complaints through 1/6 and objective desats, improved 1/7  -Lasix to PO 1/6; adjusted times to 8 AM (160 mg) and 2 PM (80 mg) for better compliance    -HF complicated by CKD    -discussed management with HF team PA [phone])   -started Imdur 30 mg daily; considered metolazone 2.5 mg per day for synergy with Lasix    -considered repeat echo, but pt had good improvement with Lasix    -unlikely new cardiac event or worsening function given med noncompliance     -troponin negative x1   -pt to be referred to HF clinic through home RN; other management below   - UOP net negative 2.3L    - Hx of HTN: Intermittent elevations.   - Continue home Coreg 12.5 bid, Norvasc 10, hydralazine TID   - Pt is not on ACEi/ARB due to ESRD.   - PT/OT recommended home health PT, set up prior to d/c; also ordered for home health RN for heart failure   RESPIRATORY: SOB: likely fluid overloaded secondary to CHF. No home O2 requriement   - Some desats 1/6 with ambulation; ordered for HH oxygen   - ambulated better 1/7 without desat, but still required frequent breaks  - flu shot administered at discharge  RENAL:  Stage 4 CKD not on dialysis: followed by Dr. Clover Mealy. Baseline Cr 3.4-3.8, and at baseline on admission.  -Cr this admission around baseline; Cr with slight increase to 4.03, likely attributable to aggressive diuresis -  Considered inpt renal consult but given pt with good response to PO diuretics and improved subjective status, as well as known stage IV CKD, doubt any large change in management -set up for close outpt follow up with Dr.  Moshe Cipro  Endo: History of diabetes--HgbA1c 7.5 in March, 8.7 in September. CBG elevated since admission   -under better control with current management; recheck A1c 7.8   -Continue home Lantus 35, SSI; also continue ASA 81, atorvastatin 40, glipizide 10 bid  Discharge Medications:    Medication List     As of 04/27/2012 12:50 PM    TAKE these medications         amLODipine 10 MG tablet   Commonly known as: NORVASC   Take 1 tablet (10 mg total) by mouth daily.      aspirin 81 MG tablet   Take 81 mg by mouth daily.      atorvastatin 40 MG tablet   Commonly known as: LIPITOR   Take 1 tablet (40 mg total) by mouth daily. For cholesterol.      calcitRIOL 0.25 MCG capsule   Commonly known as: ROCALTROL   Take 0.25 mcg by mouth daily.      carvedilol 12.5 MG tablet   Commonly known as: COREG   Take 12.5 mg by mouth 2 (two) times daily with a meal.      docusate sodium 100 MG capsule   Commonly known as: COLACE   Take 100 mg by mouth at bedtime.      furosemide 80 MG tablet   Commonly known as: LASIX   Take 1-2 tablets (80-160 mg total) by mouth 2 (two) times daily. Take two tablets at 8 AM and one tablet at 1-2 PM.      glipiZIDE 10 MG tablet   Commonly known as: GLUCOTROL   Take 1 tablet (10 mg total) by mouth 2 (two) times daily before a meal.      hydrALAZINE 50 MG tablet   Commonly known as: APRESOLINE   Take 1 tablet (50 mg total) by mouth 3 (three) times daily.      insulin glargine 100 UNIT/ML injection   Commonly known as: LANTUS   Inject 35-40 Units into the skin at bedtime.      isosorbide mononitrate 30 MG 24 hr tablet   Commonly known as: IMDUR   Take 1 tablet (30 mg total) by mouth daily.      One Daily Womens Tabs   Take by mouth daily.      trimethoprim-polymyxin b ophthalmic solution   Commonly known as: POLYTRIM   Place 1 drop into both eyes every 4 (four) hours as needed. For runny eyes       Disposition: discharge home  Issues for  Follow Up:  1. Heart failure - Evaluate for improvement of dyspnea. Assure continued compliance with Lasix. Follow up on any home health heart failure RN issues; clarify if there needs to be a formal referral to HF clinic. 2. Kidney function - Some increase in kidney function last day of admission, likely attributable to aggressive diuresis. Pt is set up for follow-up with Dr. Moshe Cipro. Consider recheck of BMP at follow-up. 3. Hx of abdominal hernia - Pt attributed some SOB to hernia and is interested in being evaluated by surgery for possible revision. Evaluate/refer as appropriate. 4. Home Health needs - Pt with some ambivalence about having home health PT/OT/RN (stated "it's not a great time." Address any further pt concerns.  Outstanding Results:  none  Discharge Instructions: Please refer to Patient Instructions section of EMR for full details.  Patient was counseled important signs and symptoms that should prompt return to medical care, changes in medications, dietary instructions, activity restrictions, and follow up appointments.       Follow-up Information    Follow up with [facility], MD. On 05/02/2012. (Appt time at 9:00 AM)    Contact information:   [place] [address] [phone]       Follow up with Louis Meckel, MD. On 05/12/2012. (Arrive no later than 11 AM.)    Contact information:   [place] [address] [phone] - Ext [phone]         Discharge Condition: stable  Tifini Reeder, [place], MD 04/27/2012, 12:50 PM

## 2012-04-25 NOTE — Progress Notes (Signed)
CHF teaching provided along with CHF d/c packet/heart failure zones. Patient verbalized understanding with teach back regarding daily weights, medications, and diet. Patient received home O2 and walker from [place]. Awaiting family for d/c home. Cindee Salt

## 2012-04-25 NOTE — Progress Notes (Signed)
Spoke to Dr. Venetia Maxon regarding home O2. Desat to 92% on room air while ambulating with PT. Continue with home O2 for PRN supplemental reasons. Rachel Hobbs

## 2012-04-25 NOTE — Progress Notes (Signed)
FMTS Attending Note Patient seen and examined by me today, discussed with resident team and I agree with Dr Ebony Hail assessment and plan.  Possible discharge today, with close outpatient follow up of renal function, which we believe is mildly elevated due to recent diuresis for treatment of HF.  Patient is taking adequate oral intake at this time; we are returning her to her usual diuretic regimen and will plan for close outpatient follow up in [facility] and with her nephrologist. Dalbert Mayotte, MD

## 2012-04-25 NOTE — Progress Notes (Signed)
Discharge instructions along with med list provided. Patient transported via wheelchair to lobby for d/c home. Belongings, home portable O2 and walker with patient. Cindee Salt

## 2012-04-27 DIAGNOSIS — I5033 Acute on chronic diastolic (congestive) heart failure: Principal | ICD-10-CM | POA: Diagnosis present

## 2012-04-27 NOTE — Discharge Summary (Signed)
FMTS Attending Note  Patient seen and examined by me on the day of discharge, I discussed with resident team and I agree with plan for discharge as documented by Dr Venetia Maxon. Dalbert Mayotte, MD

## 2012-05-02 ENCOUNTER — Ambulatory Visit (INDEPENDENT_AMBULATORY_CARE_PROVIDER_SITE_OTHER): Payer: Medicare Other | Admitting: Family Medicine

## 2012-05-02 ENCOUNTER — Encounter: Payer: Self-pay | Admitting: Family Medicine

## 2012-05-02 VITALS — BP 153/82 | HR 75 | Temp 97.9°F | Ht 62.0 in | Wt 274.0 lb

## 2012-05-02 DIAGNOSIS — N186 End stage renal disease: Secondary | ICD-10-CM

## 2012-05-02 DIAGNOSIS — I509 Heart failure, unspecified: Secondary | ICD-10-CM

## 2012-05-02 DIAGNOSIS — I10 Essential (primary) hypertension: Secondary | ICD-10-CM

## 2012-05-02 DIAGNOSIS — I5030 Unspecified diastolic (congestive) heart failure: Secondary | ICD-10-CM

## 2012-05-02 LAB — RENAL FUNCTION PANEL
Albumin: 3.7 g/dL (ref 3.5–5.2)
BUN: 62 mg/dL — ABNORMAL HIGH (ref 6–23)
Calcium: 8.8 mg/dL (ref 8.4–10.5)
Glucose, Bld: 122 mg/dL — ABNORMAL HIGH (ref 70–99)

## 2012-05-02 NOTE — Patient Instructions (Signed)
It was good to see you.  Please be sure to take your furosemide- two pills at 8 am, and one pill at 2pm every day.  You can take your middle dose of hydralazine at 2 pm too so it is not too hard to remember.    I will send you a letter with your lab results.   The office will call you with your appointment to see the Heart Failure Clinic.    Please come back and see me in about 3 months for a check up.

## 2012-05-02 NOTE — Progress Notes (Signed)
  Subjective:    Patient ID: Rachel Hobbs, female    DOB: 1940/11/22, 72 y.o.   MRN: [id number]  HPI  Rachel Hobbs comes in for hospital follow up. She was admitted last week with a congestive heart failure exacerbation that was complicated by her CKD (near end-stage not yet on HD).    Resp- feeling much better, did not end up needing oxygen at home.    CHF medications- taking Imdur (new), coreg.  She takes lasix 160 in the morning, but does not always take the evening dose because it makes her have to go to the bathroom so much.  Breathing and LE edema are much improved.   CKD- seeing Nephrology at the end of the month.  She has not yet had access placed, says they told her her veins were too small.   BP- taking coreg, lasix(not always second dose), amlodipine, hydralazine (sometimes misses noon dose).  Denies chest pain, palpitations, headaches, vision changes.   I have reviewed the patient's medical history in detail and updated the computerized patient record.  Review of Systems See HPI    Objective:   Physical Exam BP 153/82  Pulse 75  Temp 97.9 F (36.6 C) (Oral)  Ht 5\' 2"  (1.575 m)  Wt 274 lb (124.286 kg)  BMI 50.12 kg/m2  SpO2 97% General appearance: alert, cooperative and no distress Neck: no adenopathy, no JVD and supple, symmetrical, trachea midline Lungs: clear to auscultation bilaterally Heart: regular rate and rhythm, S1, S2 normal, no murmur, click, rub or gallop Extremities: 1+ pitting edema, chronic venous stasis changes.  Pulses: 2+ and symmetric       Assessment & Plan:

## 2012-05-02 NOTE — Assessment & Plan Note (Signed)
Acceptable control, continue medications at current dose.

## 2012-05-02 NOTE — Assessment & Plan Note (Signed)
Discussed that she should talk with nephrologist about when she needs to have access placed.  Will check renal function panel and she will see Nephrology in a few weeks.

## 2012-05-02 NOTE — Assessment & Plan Note (Signed)
Improved from exacerbation, appears stable right now.  Unclear if she still only has diastolic CHF. Due to complexity of multiple medical problems, I will refer her to see the Heart Failure Clinic to see if they can offer further management suggestions.  For now, stressed importance of taking lasix twice a day, taking coreg imdur, and all BP medications.

## 2012-05-03 ENCOUNTER — Encounter: Payer: Self-pay | Admitting: Family Medicine

## 2012-05-18 ENCOUNTER — Ambulatory Visit (HOSPITAL_COMMUNITY): Payer: Medicare Other

## 2012-05-25 ENCOUNTER — Other Ambulatory Visit (HOSPITAL_COMMUNITY): Payer: Self-pay | Admitting: *Deleted

## 2012-05-26 ENCOUNTER — Telehealth (HOSPITAL_COMMUNITY): Payer: Self-pay | Admitting: Adult Health

## 2012-05-26 ENCOUNTER — Ambulatory Visit (HOSPITAL_COMMUNITY)
Admission: RE | Admit: 2012-05-26 | Discharge: 2012-05-26 | Disposition: A | Discharge status: Home / Self Care | Payer: Medicare Other | Source: Ambulatory Visit | Attending: Internal Medicine | Admitting: Internal Medicine

## 2012-05-26 ENCOUNTER — Encounter (HOSPITAL_COMMUNITY)
Admission: RE | Admit: 2012-05-26 | Discharge: 2012-05-26 | Disposition: A | Discharge status: Home / Self Care | Payer: Medicare Other | Source: Ambulatory Visit | Attending: Nephrology | Admitting: Nephrology

## 2012-05-26 ENCOUNTER — Encounter (HOSPITAL_COMMUNITY): Payer: Self-pay

## 2012-05-26 VITALS — BP 144/70 | HR 95 | Wt 265.0 lb

## 2012-05-26 DIAGNOSIS — N184 Chronic kidney disease, stage 4 (severe): Secondary | ICD-10-CM | POA: Insufficient documentation

## 2012-05-26 DIAGNOSIS — I509 Heart failure, unspecified: Secondary | ICD-10-CM

## 2012-05-26 DIAGNOSIS — D638 Anemia in other chronic diseases classified elsewhere: Secondary | ICD-10-CM | POA: Insufficient documentation

## 2012-05-26 DIAGNOSIS — I5030 Unspecified diastolic (congestive) heart failure: Secondary | ICD-10-CM

## 2012-05-26 LAB — FERRITIN: Ferritin: 544 ng/mL — ABNORMAL HIGH (ref 10–291)

## 2012-05-26 LAB — RENAL FUNCTION PANEL
Albumin: 3.8 g/dL (ref 3.5–5.2)
BUN: 72 mg/dL — ABNORMAL HIGH (ref 6–23)
CO2: 25 mEq/L (ref 19–32)
Calcium: 9.6 mg/dL (ref 8.4–10.5)
Chloride: 96 mEq/L (ref 96–112)
Creatinine, Ser: 3.93 mg/dL — ABNORMAL HIGH (ref 0.50–1.10)
GFR calc non Af Amer: 11 mL/min — ABNORMAL LOW (ref >90)

## 2012-05-26 LAB — BASIC METABOLIC PANEL
BUN: 72 mg/dL — ABNORMAL HIGH (ref 6–23)
Creatinine, Ser: 3.96 mg/dL — ABNORMAL HIGH (ref 0.50–1.10)
GFR calc Af Amer: 12 mL/min — ABNORMAL LOW (ref >90)
GFR calc non Af Amer: 10 mL/min — ABNORMAL LOW (ref >90)

## 2012-05-26 LAB — IRON AND TIBC
Saturation Ratios: 31 % (ref 20–55)
TIBC: 243 ug/dL — ABNORMAL LOW (ref 250–470)

## 2012-05-26 MED ORDER — EPOETIN ALFA 20000 UNIT/ML IJ SOLN
INTRAMUSCULAR | Status: AC
  Filled 2012-05-26: qty 1

## 2012-05-26 MED ORDER — EPOETIN ALFA 20000 UNIT/ML IJ SOLN
20000.0000 [IU] | INTRAMUSCULAR | Status: DC
  Administered 2012-05-26: 20000 [IU] via SUBCUTANEOUS

## 2012-05-26 MED ORDER — EPOETIN ALFA 10000 UNIT/ML IJ SOLN
INTRAMUSCULAR | Status: AC
  Filled 2012-05-26: qty 2

## 2012-05-26 NOTE — Patient Instructions (Addendum)
Follow up in 3 months   Do the following things EVERYDAY: 1) Weigh yourself in the morning before breakfast. Write it down and keep it in a log. 2) Take your medicines as prescribed 3) Eat low salt foods-Limit salt (sodium) to 2000 mg per day.  4) Stay as active as you can everyday 5) Limit all fluids for the day to less than 2 liters  

## 2012-05-26 NOTE — Telephone Encounter (Signed)
Error

## 2012-05-26 NOTE — Assessment & Plan Note (Addendum)
Explained the purpose of HF clinic. Volume status appears stable. Continue current diuretic regimen. Take an extra lasix if weight is up 8-10 pounds. May consider Metolazone if weight continues to trend up.  Check BMET today. Reinforced daily weights, limiting fluid intake, and low salt food choices. Follow up in 3 months  Patient seen and examined with Darrick Grinder, NP. We discussed all aspects of the encounter. I agree with the assessment and plan as stated above.  Main issue appears to be her declining renal function and cardiorenal syndrome. In this setting our ability to manage her will be limited. I explained to her that she will likely need HD in the near future to help regulate her volume status. For now, reinforced need for daily weights and reviewed use of sliding scale diuretics. She has appt with Renal this month.

## 2012-05-26 NOTE — Progress Notes (Signed)
Patient ID: Rachel Hobbs, female   DOB: [birth year], 72 y.o.   MRN: [id number] Nephrologist: Dr Moshe Cipro PCP: [facility]  HPI: She is referred to HF clinic by Dr Beckie Salts ([place]) for HF management.   Rachel Hobbs is a 72 year old with a PMH of morbid obesity, chronic diastolic heart failure, DM, CKD stage IV (baseline 3.4-3.8), HTN, and abdominal hernia (CT abdomen 04/2011). She is not on Ace/Arb due to CKD.   ECHO 06/2011 EF [id number] Grade II diastolic heart failure. RV normal   Admitted to [facility] 04/21/2012 due to dyspnea. Pro BNP 720.8 Diuresed with IV lasix and transitioned to Lasix 160 mg in am and 80 mg at night. Discharged weight 284 pounds. D/C Creatinine 4.03  05/02/2012 Creatinine 3.66 (steadily increasing)  She endorses exertional dyspnea with just mild activity. Denies PND. + Orthopnea. Sleeps in chair. Now weighing daily. Weight at home 264-266 pounds. Does not use lasix sliding scale. Complains of lower extremity edema. Says she has cut back on fluid intake. Lives with her daughter. Compliant with medications. She has not had medications this morning.    Review of Systems:     Cardiac Review of Systems: {Y] = yes [ ]  = no  Chest Pain [    ]  Resting SOB [   ] Exertional SOB  [Y  ]  Orthopnea [ Y ]   Pedal Edema [   ]    Palpitations [  ] Syncope  [  ]   Presyncope [   ]  General Review of Systems: [Y] = yes [  ]=no Constitional: recent weight change [ Y ]; anorexia [  ]; fatigue [ Y ]; nausea [  ]; night sweats [  ]; fever [  ]; or chills [  ];                                                                                                                                          Dental: poor dentition[  ]; Last Dentist visit:   Eye : blurred vision [  ]; diplopia [   ]; vision changes [  ];  Amaurosis fugax[  ]; Resp: cough [  ];  wheezing[  ];  hemoptysis[  ]; shortness of breath[ Y ]; paroxysmal nocturnal dyspnea[  ]; dyspnea on exertion[  ]; or orthopnea[  ];  GI:   gallstones[  ], vomiting[  ];  dysphagia[  ]; melena[  ];  hematochezia [  ]; heartburn[  ];   Hx of  Colonoscopy[  ]; GU: kidney stones [  ]; hematuria[  ];   dysuria [  ];  nocturia[  ];  history of     obstruction [  ];                 Skin: rash, swelling[ Y ];, hair loss[  ];  peripheral edema[  ];  or itching[  ]; Musculosketetal: myalgias[  ];  joint swelling[  ];  joint erythema[  ];  joint pain[ Y ];  back pain[  ];  Heme/Lymph: bruising[  ];  bleeding[  ];  anemia[  ];  Neuro: TIA[  ];  headaches[  ];  stroke[  ];  vertigo[  ];  seizures[  ];   paresthesias[  ];  difficulty walking[  ];  Psych:depression[  ]; anxiety[  ];  Endocrine: diabetes[ Y ];  thyroid dysfunction[  ];  Immunizations: Flu [  ]; Pneumococcal[  ];  Other:    Past Medical History  Diagnosis Date  . Depression   . Hyperlipidemia   . Hypertension   . Diabetes mellitus   . Hernia     Current Outpatient Prescriptions  Medication Sig Dispense Refill  . amLODipine (NORVASC) 10 MG tablet Take 1 tablet (10 mg total) by mouth daily.  90 tablet  3  . aspirin 81 MG tablet Take 81 mg by mouth daily.        Marland Kitchen atorvastatin (LIPITOR) 40 MG tablet Take 1 tablet (40 mg total) by mouth daily. For cholesterol.  90 tablet  2  . calcitRIOL (ROCALTROL) 0.25 MCG capsule Take 0.25 mcg by mouth daily.       . carvedilol (COREG) 12.5 MG tablet Take 12.5 mg by mouth 2 (two) times daily with a meal.      . docusate sodium (COLACE) 100 MG capsule Take 100 mg by mouth at bedtime.       . furosemide (LASIX) 80 MG tablet Take 1-2 tablets (80-160 mg total) by mouth 2 (two) times daily. Take two tablets at 8 AM and one tablet at 1-2 PM.      . glipiZIDE (GLUCOTROL) 10 MG tablet Take 1 tablet (10 mg total) by mouth 2 (two) times daily before a meal.  180 tablet  3  . hydrALAZINE (APRESOLINE) 50 MG tablet Take 1 tablet (50 mg total) by mouth 3 (three) times daily.  270 tablet  3  . insulin glargine (LANTUS) 100 UNIT/ML injection Inject  35-40 Units into the skin at bedtime.       . isosorbide mononitrate (IMDUR) 30 MG 24 hr tablet Take 1 tablet (30 mg total) by mouth daily.  30 tablet  1  . Multiple Vitamins-Minerals (ONE DAILY WOMENS) TABS Take by mouth daily.        Marland Kitchen trimethoprim-polymyxin b (POLYTRIM) ophthalmic solution Place 1 drop into both eyes every 4 (four) hours as needed. For runny eyes         No Known Allergies  History   Social History  . Marital Status: Single    Spouse Name: N/A    Number of Children: 1  . Years of Education: some colle   Occupational History  . Retired- [employer]     Social History Main Topics  . Smoking status: Never Smoker   . Smokeless tobacco: Never Used  . Alcohol Use: No  . Drug Use: No  . Sexually Active: No   Other Topics Concern  . Not on file   Social History Narrative   Health Care POA: Emergency Contact: daugher, [name], [phone] of Life Plan: Who lives with you: Lives with daughter- 1 story homeAny pets: noneDiet: Pt eats a variety of food but is not currently trying to limit calories or diet. Exercise: Pt does not have any regular exercise routine. Seatbelts: Pt reports wearing seatbelt when in vehicle. Nancy Fetter Exposure/Protection: Hobbies: shopping, senior  center, singing    Family History  Problem Relation Age of Onset  . Kidney disease Mother   . COPD Father     smoke  . Cancer Father     Lung  . Hypertension Father   . Cancer Brother     prostate  . Kidney disease Sister     PHYSICAL EXAM: Filed Vitals:   05/26/12 0956  BP: 144/70  Pulse: 95   General:  Well appearing. No respiratory difficulty HEENT: normal Neck: supple. Difficult to tell due to body habitus but does not appear elevated Carotids 2+ bilat; no bruits. No lymphadenopathy or thryomegaly appreciated. Cor: PMI nondisplaced. Regular rate & rhythm. No rubs, gallops or murmurs. Lungs: clear Abdomen: obese, soft, nontender, nondistended. No hepatosplenomegaly. No bruits or masses.  Good bowel sounds. Hernia Extremities: no cyanosis, clubbing, rash, edema Neuro: alert & oriented x 3, cranial nerves grossly intact. moves all 4 extremities w/o difficulty. Affect pleasant.   ASSESSMENT & PLAN:

## 2012-05-29 LAB — PTH, INTACT AND CALCIUM
Calcium, Total (PTH): 9.3 mg/dL (ref 8.4–10.5)
PTH: 268.7 pg/mL — ABNORMAL HIGH (ref 14.0–72.0)

## 2012-06-16 ENCOUNTER — Encounter (HOSPITAL_COMMUNITY)
Admission: RE | Admit: 2012-06-16 | Discharge: 2012-06-16 | Disposition: A | Discharge status: Home / Self Care | Payer: Medicare Other | Source: Ambulatory Visit | Attending: Nephrology | Admitting: Nephrology

## 2012-06-16 MED ORDER — EPOETIN ALFA 20000 UNIT/ML IJ SOLN
INTRAMUSCULAR | Status: AC
  Filled 2012-06-16: qty 1

## 2012-06-16 MED ORDER — EPOETIN ALFA 20000 UNIT/ML IJ SOLN
20000.0000 [IU] | INTRAMUSCULAR | Status: DC
  Administered 2012-06-16: 20000 [IU] via SUBCUTANEOUS

## 2012-06-22 ENCOUNTER — Ambulatory Visit (INDEPENDENT_AMBULATORY_CARE_PROVIDER_SITE_OTHER): Payer: Medicare Other | Admitting: Family Medicine

## 2012-06-22 ENCOUNTER — Encounter: Payer: Self-pay | Admitting: Family Medicine

## 2012-06-22 VITALS — BP 156/84 | HR 72 | Temp 98.0°F | Ht 62.0 in | Wt 268.6 lb

## 2012-06-22 DIAGNOSIS — M7989 Other specified soft tissue disorders: Secondary | ICD-10-CM

## 2012-06-22 DIAGNOSIS — R0981 Nasal congestion: Secondary | ICD-10-CM | POA: Insufficient documentation

## 2012-06-22 HISTORY — DX: Other specified soft tissue disorders: M79.89

## 2012-06-22 MED ORDER — CETIRIZINE HCL 10 MG PO TABS: 10.0000 mg | ORAL_TABLET | Freq: Every day | ORAL | Status: DC

## 2012-06-22 NOTE — Assessment & Plan Note (Signed)
From trauma several years ago. Patient is concerned it may be enlarging. It does not appear worrisome at this time for infection or neoplasm. We will monitor for now; re-check in a month.

## 2012-06-22 NOTE — Progress Notes (Signed)
  Subjective:    Patient ID: Rachel Hobbs, female    DOB: 02/10/1941, 72 y.o.   MRN: [id number]  HPI SDA  # Knot on head She feels like this is getting better. It started a few years after she hit her head.  She is not sure why it is getting bigger. She denies new trauma, tenderness/pain, drainage.  # Eyes watering, mucous tinged with blood when she blows her nose, mild sore throat that has since resolved, occasional chills Her symptoms started several days ago. She denies fevers, difficulty breathing, nausea/vomiting, rash  Review of Systems Per HPI  Allergies, medication, past medical history reviewed.  Smoking status noted.  T2DM on insulin  HTN Renal insufficiency chronic Alopecia Diastolic heart failure      Objective:   Physical Exam GEN: NAD; obese HEENT:   Head: [place]/AT; 2.5 diameter circular nodule without drainage, non-fluctuant, non-tender   Eyes: no conjunctival erythema with scant clear drainage   Nose: congestion, scant clear nasal discharge   Mouth: MMM; no tonsillar adenopathy; no oropharyngeal erythema  NECK: no LAD CV: RRR PULM: NI WOB; CTAB without rales NEURO: alert and oriented; pleasant     Assessment & Plan:

## 2012-06-22 NOTE — Assessment & Plan Note (Signed)
Allergic rhinitis versus URI. Try anti-histamine. Follow-up if symptoms worsen or do not improve.

## 2012-06-22 NOTE — Patient Instructions (Signed)
Let's watch the knot on your head for now It does not look worrisome at this time Follow-up in 1 month to re-check or sooner if you have any changes   You may have allergies or a cold Try the allergy medication (cetirizine) one time a day for your symptoms Follow-up in 1 week if you do not feel better or start to feel worse (fevers, nausea/vomiting, worsening eye drainage)

## 2012-06-27 ENCOUNTER — Other Ambulatory Visit (HOSPITAL_COMMUNITY): Payer: Self-pay | Admitting: Family Medicine

## 2012-07-07 ENCOUNTER — Encounter (HOSPITAL_COMMUNITY)
Admission: RE | Admit: 2012-07-07 | Discharge: 2012-07-07 | Disposition: A | Discharge status: Home / Self Care | Payer: Medicare Other | Source: Ambulatory Visit | Attending: Nephrology | Admitting: Nephrology

## 2012-07-07 DIAGNOSIS — N184 Chronic kidney disease, stage 4 (severe): Secondary | ICD-10-CM | POA: Insufficient documentation

## 2012-07-07 DIAGNOSIS — D638 Anemia in other chronic diseases classified elsewhere: Secondary | ICD-10-CM | POA: Insufficient documentation

## 2012-07-07 LAB — RENAL FUNCTION PANEL
CO2: 26 mEq/L (ref 19–32)
Calcium: 9.2 mg/dL (ref 8.4–10.5)
Creatinine, Ser: 3.63 mg/dL — ABNORMAL HIGH (ref 0.50–1.10)
Glucose, Bld: 134 mg/dL — ABNORMAL HIGH (ref 70–99)
Phosphorus: 4.1 mg/dL (ref 2.3–4.6)
Sodium: 139 mEq/L (ref 135–145)

## 2012-07-07 MED ORDER — EPOETIN ALFA 20000 UNIT/ML IJ SOLN
INTRAMUSCULAR | Status: AC
  Administered 2012-07-07: 20000 [IU] via SUBCUTANEOUS
  Filled 2012-07-07: qty 1

## 2012-07-07 MED ORDER — EPOETIN ALFA 20000 UNIT/ML IJ SOLN: 20000.0000 [IU] | INTRAMUSCULAR | Status: DC

## 2012-07-08 LAB — FERRITIN: Ferritin: 464 ng/mL — ABNORMAL HIGH (ref 10–291)

## 2012-07-08 LAB — IRON AND TIBC
Saturation Ratios: 34 % (ref 20–55)
UIBC: 148 ug/dL (ref 125–400)

## 2012-07-10 LAB — PTH, INTACT AND CALCIUM
Calcium, Total (PTH): 8.8 mg/dL (ref 8.4–10.5)
PTH: 179 pg/mL — ABNORMAL HIGH (ref 14.0–72.0)

## 2012-07-28 ENCOUNTER — Encounter (HOSPITAL_COMMUNITY)
Admission: RE | Admit: 2012-07-28 | Discharge: 2012-07-28 | Disposition: A | Discharge status: Home / Self Care | Payer: Medicare Other | Source: Ambulatory Visit | Attending: Nephrology | Admitting: Nephrology

## 2012-07-28 DIAGNOSIS — D638 Anemia in other chronic diseases classified elsewhere: Secondary | ICD-10-CM | POA: Insufficient documentation

## 2012-07-28 DIAGNOSIS — N184 Chronic kidney disease, stage 4 (severe): Secondary | ICD-10-CM | POA: Insufficient documentation

## 2012-07-28 LAB — POCT HEMOGLOBIN-HEMACUE: Hemoglobin: 10.4 g/dL — ABNORMAL LOW (ref 12.0–15.0)

## 2012-07-28 MED ORDER — EPOETIN ALFA 20000 UNIT/ML IJ SOLN
INTRAMUSCULAR | Status: AC
  Administered 2012-07-28: 20000 [IU] via SUBCUTANEOUS
  Filled 2012-07-28: qty 1

## 2012-07-28 MED ORDER — EPOETIN ALFA 20000 UNIT/ML IJ SOLN: 20000.0000 [IU] | INTRAMUSCULAR | Status: DC

## 2012-08-18 ENCOUNTER — Encounter (HOSPITAL_COMMUNITY)
Admission: RE | Admit: 2012-08-18 | Discharge: 2012-08-18 | Disposition: A | Discharge status: Home / Self Care | Payer: Medicare Other | Source: Ambulatory Visit | Attending: Nephrology | Admitting: Nephrology

## 2012-08-18 DIAGNOSIS — D638 Anemia in other chronic diseases classified elsewhere: Secondary | ICD-10-CM | POA: Insufficient documentation

## 2012-08-18 DIAGNOSIS — N184 Chronic kidney disease, stage 4 (severe): Secondary | ICD-10-CM | POA: Insufficient documentation

## 2012-08-18 LAB — RENAL FUNCTION PANEL
Albumin: 3.5 g/dL (ref 3.5–5.2)
CO2: 25 mEq/L (ref 19–32)
Calcium: 8.9 mg/dL (ref 8.4–10.5)
GFR calc Af Amer: 13 mL/min — ABNORMAL LOW (ref >90)
GFR calc non Af Amer: 11 mL/min — ABNORMAL LOW (ref >90)
Phosphorus: 4.7 mg/dL — ABNORMAL HIGH (ref 2.3–4.6)
Sodium: 139 mEq/L (ref 135–145)

## 2012-08-18 LAB — POCT HEMOGLOBIN-HEMACUE: Hemoglobin: 10.5 g/dL — ABNORMAL LOW (ref 12.0–15.0)

## 2012-08-18 MED ORDER — EPOETIN ALFA 20000 UNIT/ML IJ SOLN
20000.0000 [IU] | INTRAMUSCULAR | Status: DC
  Administered 2012-08-18: 20000 [IU] via SUBCUTANEOUS

## 2012-08-18 MED ORDER — EPOETIN ALFA 20000 UNIT/ML IJ SOLN
INTRAMUSCULAR | Status: AC
  Filled 2012-08-18: qty 1

## 2012-08-21 LAB — PTH, INTACT AND CALCIUM: Calcium, Total (PTH): 8.9 mg/dL (ref 8.4–10.5)

## 2012-09-07 ENCOUNTER — Encounter (HOSPITAL_COMMUNITY): Payer: Self-pay

## 2012-09-07 ENCOUNTER — Ambulatory Visit (HOSPITAL_COMMUNITY)
Admission: RE | Admit: 2012-09-07 | Discharge: 2012-09-07 | Disposition: A | Discharge status: Home / Self Care | Payer: Medicare Other | Source: Ambulatory Visit | Attending: Internal Medicine | Admitting: Internal Medicine

## 2012-09-07 ENCOUNTER — Other Ambulatory Visit (HOSPITAL_COMMUNITY): Payer: Self-pay | Admitting: *Deleted

## 2012-09-07 VITALS — BP 148/60 | HR 69 | Wt 270.1 lb

## 2012-09-07 DIAGNOSIS — N19 Unspecified kidney failure: Secondary | ICD-10-CM

## 2012-09-07 DIAGNOSIS — N184 Chronic kidney disease, stage 4 (severe): Secondary | ICD-10-CM | POA: Insufficient documentation

## 2012-09-07 DIAGNOSIS — I509 Heart failure, unspecified: Secondary | ICD-10-CM | POA: Insufficient documentation

## 2012-09-07 DIAGNOSIS — I10 Essential (primary) hypertension: Secondary | ICD-10-CM

## 2012-09-07 DIAGNOSIS — I5032 Chronic diastolic (congestive) heart failure: Secondary | ICD-10-CM | POA: Insufficient documentation

## 2012-09-07 DIAGNOSIS — I5030 Unspecified diastolic (congestive) heart failure: Secondary | ICD-10-CM

## 2012-09-07 DIAGNOSIS — I129 Hypertensive chronic kidney disease with stage 1 through stage 4 chronic kidney disease, or unspecified chronic kidney disease: Secondary | ICD-10-CM | POA: Insufficient documentation

## 2012-09-07 MED ORDER — HYDRALAZINE HCL 50 MG PO TABS: 100.0000 mg | ORAL_TABLET | Freq: Three times a day (TID) | ORAL | Status: DC

## 2012-09-07 NOTE — Progress Notes (Signed)
Nephrologist: Dr. Moshe Cipro  HPI:  She is referred to HF clinic by Dr Beckie Salts ([place]) for HF management.   Rachel Hobbs is a 72 year old with a PMH of morbid obesity, chronic diastolic heart failure, DM, CKD stage IV (baseline 3.4-3.8), HTN, and abdominal hernia (CT abdomen 04/2011). She is not on Ace/Arb due to CKD.   ECHO 06/2011 EF [id number] Grade II diastolic heart failure. RV normal   Admitted to [facility] 04/21/2012 due to dyspnea. Pro BNP 720.8 Diuresed with IV lasix and transitioned to Lasix 160 mg in am and 80 mg at night. Discharged weight 284 pounds. D/C Creatinine 4.03   She returns for follow up today.  She feels pretty good but is fatigued during the day.  She denies increased dyspnea.  She is currently not weighing due to scale being broken.  She has not taken extra fluid pills.  She has chronic orthopnea.  No PND.  No edema.  She continues to follow up with Dr. Moshe Cipro and gets epogen injections every 3 weeks.  Cr 2 weeks ago stable at 3.82.   ROS: All systems negative except as listed in HPI, PMH and Problem List.  Past Medical History  Diagnosis Date  . Depression   . Hyperlipidemia   . Hypertension   . Diabetes mellitus   . Hernia     Current Outpatient Prescriptions  Medication Sig Dispense Refill  . amLODipine (NORVASC) 10 MG tablet Take 1 tablet (10 mg total) by mouth daily.  90 tablet  3  . aspirin 81 MG tablet Take 81 mg by mouth daily.        Marland Kitchen atorvastatin (LIPITOR) 40 MG tablet Take 1 tablet (40 mg total) by mouth daily. For cholesterol.  90 tablet  2  . calcitRIOL (ROCALTROL) 0.25 MCG capsule Take 0.25 mcg by mouth daily.       . carvedilol (COREG) 12.5 MG tablet Take 12.5 mg by mouth 2 (two) times daily with a meal.      . cetirizine (ZYRTEC) 10 MG tablet Take 1 tablet (10 mg total) by mouth daily.  30 tablet  0  . docusate sodium (COLACE) 100 MG capsule Take 100 mg by mouth at bedtime.       . furosemide (LASIX) 80 MG tablet Take 1-2 tablets (80-160 mg total)  by mouth 2 (two) times daily. Take two tablets at 8 AM and one tablet at 1-2 PM.      . glipiZIDE (GLUCOTROL) 10 MG tablet Take 1 tablet (10 mg total) by mouth 2 (two) times daily before a meal.  180 tablet  3  . hydrALAZINE (APRESOLINE) 50 MG tablet Take 1 tablet (50 mg total) by mouth 3 (three) times daily.  270 tablet  3  . insulin glargine (LANTUS) 100 UNIT/ML injection Inject 35-40 Units into the skin at bedtime.       . isosorbide mononitrate (IMDUR) 30 MG 24 hr tablet TAKE 1 TABLET BY MOUTH DAILY  30 tablet  5  . Multiple Vitamins-Minerals (ONE DAILY WOMENS) TABS Take by mouth daily.        Marland Kitchen trimethoprim-polymyxin b (POLYTRIM) ophthalmic solution Place 1 drop into both eyes every 4 (four) hours as needed. For runny eyes       No current facility-administered medications for this encounter.     PHYSICAL EXAM: Filed Vitals:   09/07/12 1230  BP: 148/60  Pulse: 69  Weight: 270 lb 1.9 oz (122.526 kg)  SpO2: 100%    General:  Well appearing. No resp difficulty HEENT: normal Neck: supple. JVP appears flat but difficult to assess due to body habitus. Carotids 2+ bilaterally; no bruits. No lymphadenopathy or thryomegaly appreciated. Cor: PMI normal. Regular rate & rhythm. No rubs, gallops or murmurs. Lungs: clear Abdomen: soft, nontender, nondistended. No hepatosplenomegaly. No bruits or masses. Good bowel sounds. Extremities: no cyanosis, clubbing, rash, tr edema Neuro: alert & orientedx3, cranial nerves grossly intact. Moves all 4 extremities w/o difficulty. Affect pleasant.    ASSESSMENT & PLAN:

## 2012-09-07 NOTE — Assessment & Plan Note (Signed)
Volume status appears stable.  Will continue lasix 160/80 mg.  Have provided her with a scale so she can start weighing daily.  Have discussed use of sliding scale.  Will need to work on better blood pressure control, increase hydralazine 100 mg TID.

## 2012-09-07 NOTE — Assessment & Plan Note (Signed)
Renal function stable.  She will most likely need HD in the future.  Continue follow up with Dr. Moshe Cipro.

## 2012-09-07 NOTE — Assessment & Plan Note (Signed)
Uncontrolled, as above will increase hydralazine 100 mg TID.

## 2012-09-07 NOTE — Patient Instructions (Addendum)
Follow up in 3 months.  Increase hydralazine 2 tabs (100 mg) three times daily.  Then when you pick up the new prescription take 1 tab three times daily.  Do the following things EVERYDAY: 1) Weigh yourself in the morning before breakfast. Write it down and keep it in a log. 2) Take your medicines as prescribed 3) Eat low salt foods-Limit salt (sodium) to 2000 mg per day.  4) Stay as active as you can everyday 5) Limit all fluids for the day to less than 2 liters

## 2012-09-08 ENCOUNTER — Encounter (HOSPITAL_COMMUNITY)
Admission: RE | Admit: 2012-09-08 | Discharge: 2012-09-08 | Disposition: A | Discharge status: Home / Self Care | Payer: Medicare Other | Source: Ambulatory Visit | Attending: Nephrology | Admitting: Nephrology

## 2012-09-08 LAB — POCT HEMOGLOBIN-HEMACUE: Hemoglobin: 10.7 g/dL — ABNORMAL LOW (ref 12.0–15.0)

## 2012-09-08 MED ORDER — EPOETIN ALFA 20000 UNIT/ML IJ SOLN: 20000.0000 [IU] | INTRAMUSCULAR | Status: DC

## 2012-09-08 MED ORDER — EPOETIN ALFA 20000 UNIT/ML IJ SOLN
INTRAMUSCULAR | Status: AC
  Administered 2012-09-08: 20000 [IU] via SUBCUTANEOUS
  Filled 2012-09-08: qty 1

## 2012-09-29 ENCOUNTER — Encounter (HOSPITAL_COMMUNITY)
Admission: RE | Admit: 2012-09-29 | Discharge: 2012-09-29 | Disposition: A | Discharge status: Home / Self Care | Payer: Medicare Other | Source: Ambulatory Visit | Attending: Nephrology | Admitting: Nephrology

## 2012-09-29 DIAGNOSIS — N184 Chronic kidney disease, stage 4 (severe): Secondary | ICD-10-CM | POA: Insufficient documentation

## 2012-09-29 DIAGNOSIS — D638 Anemia in other chronic diseases classified elsewhere: Secondary | ICD-10-CM | POA: Insufficient documentation

## 2012-09-29 LAB — IRON AND TIBC
Iron: 63 ug/dL (ref 42–135)
UIBC: 156 ug/dL (ref 125–400)

## 2012-09-29 LAB — RENAL FUNCTION PANEL
Calcium: 8.8 mg/dL (ref 8.4–10.5)
GFR calc Af Amer: 12 mL/min — ABNORMAL LOW (ref >90)
GFR calc non Af Amer: 11 mL/min — ABNORMAL LOW (ref >90)
Glucose, Bld: 193 mg/dL — ABNORMAL HIGH (ref 70–99)
Phosphorus: 4.3 mg/dL (ref 2.3–4.6)
Potassium: 4.3 mEq/L (ref 3.5–5.1)
Sodium: 139 mEq/L (ref 135–145)

## 2012-09-29 LAB — POCT HEMOGLOBIN-HEMACUE: Hemoglobin: 10.3 g/dL — ABNORMAL LOW (ref 12.0–15.0)

## 2012-09-29 MED ORDER — EPOETIN ALFA 20000 UNIT/ML IJ SOLN
20000.0000 [IU] | INTRAMUSCULAR | Status: DC
  Administered 2012-09-29: 20000 [IU] via SUBCUTANEOUS

## 2012-09-29 MED ORDER — EPOETIN ALFA 20000 UNIT/ML IJ SOLN
INTRAMUSCULAR | Status: AC
  Filled 2012-09-29: qty 1

## 2012-10-02 LAB — PTH, INTACT AND CALCIUM: Calcium, Total (PTH): 8.5 mg/dL (ref 8.4–10.5)

## 2012-10-19 ENCOUNTER — Encounter (HOSPITAL_COMMUNITY)
Admission: RE | Admit: 2012-10-19 | Discharge: 2012-10-19 | Disposition: A | Discharge status: Home / Self Care | Payer: Medicare Other | Source: Ambulatory Visit | Attending: Nephrology | Admitting: Nephrology

## 2012-10-19 DIAGNOSIS — N184 Chronic kidney disease, stage 4 (severe): Secondary | ICD-10-CM | POA: Insufficient documentation

## 2012-10-19 DIAGNOSIS — D638 Anemia in other chronic diseases classified elsewhere: Secondary | ICD-10-CM | POA: Insufficient documentation

## 2012-10-19 LAB — POCT HEMOGLOBIN-HEMACUE: Hemoglobin: 10.1 g/dL — ABNORMAL LOW (ref 12.0–15.0)

## 2012-10-19 MED ORDER — EPOETIN ALFA 20000 UNIT/ML IJ SOLN
INTRAMUSCULAR | Status: AC
  Filled 2012-10-19: qty 1

## 2012-10-19 MED ORDER — EPOETIN ALFA 20000 UNIT/ML IJ SOLN
20000.0000 [IU] | INTRAMUSCULAR | Status: DC
  Administered 2012-10-19: 20000 [IU] via SUBCUTANEOUS

## 2012-10-24 ENCOUNTER — Emergency Department (HOSPITAL_COMMUNITY): Payer: Medicare Other

## 2012-10-24 ENCOUNTER — Encounter (HOSPITAL_COMMUNITY): Payer: Self-pay

## 2012-10-24 ENCOUNTER — Emergency Department (HOSPITAL_COMMUNITY)
Admission: EM | Admit: 2012-10-24 | Discharge: 2012-10-24 | Disposition: A | Discharge status: Home / Self Care | Payer: Medicare Other | Attending: Emergency Medicine | Admitting: Emergency Medicine

## 2012-10-24 DIAGNOSIS — N189 Chronic kidney disease, unspecified: Secondary | ICD-10-CM

## 2012-10-24 DIAGNOSIS — Z79899 Other long term (current) drug therapy: Secondary | ICD-10-CM | POA: Insufficient documentation

## 2012-10-24 DIAGNOSIS — F3289 Other specified depressive episodes: Secondary | ICD-10-CM | POA: Insufficient documentation

## 2012-10-24 DIAGNOSIS — R21 Rash and other nonspecific skin eruption: Secondary | ICD-10-CM | POA: Insufficient documentation

## 2012-10-24 DIAGNOSIS — R609 Edema, unspecified: Secondary | ICD-10-CM | POA: Insufficient documentation

## 2012-10-24 DIAGNOSIS — Z7982 Long term (current) use of aspirin: Secondary | ICD-10-CM | POA: Insufficient documentation

## 2012-10-24 DIAGNOSIS — K439 Ventral hernia without obstruction or gangrene: Secondary | ICD-10-CM | POA: Insufficient documentation

## 2012-10-24 DIAGNOSIS — Z794 Long term (current) use of insulin: Secondary | ICD-10-CM | POA: Insufficient documentation

## 2012-10-24 DIAGNOSIS — E669 Obesity, unspecified: Secondary | ICD-10-CM | POA: Insufficient documentation

## 2012-10-24 DIAGNOSIS — I10 Essential (primary) hypertension: Secondary | ICD-10-CM | POA: Insufficient documentation

## 2012-10-24 DIAGNOSIS — R0602 Shortness of breath: Secondary | ICD-10-CM | POA: Insufficient documentation

## 2012-10-24 DIAGNOSIS — E119 Type 2 diabetes mellitus without complications: Secondary | ICD-10-CM | POA: Insufficient documentation

## 2012-10-24 DIAGNOSIS — E785 Hyperlipidemia, unspecified: Secondary | ICD-10-CM | POA: Insufficient documentation

## 2012-10-24 DIAGNOSIS — I5033 Acute on chronic diastolic (congestive) heart failure: Secondary | ICD-10-CM

## 2012-10-24 DIAGNOSIS — F329 Major depressive disorder, single episode, unspecified: Secondary | ICD-10-CM | POA: Insufficient documentation

## 2012-10-24 DIAGNOSIS — I129 Hypertensive chronic kidney disease with stage 1 through stage 4 chronic kidney disease, or unspecified chronic kidney disease: Secondary | ICD-10-CM | POA: Insufficient documentation

## 2012-10-24 LAB — CBC
HCT: 31.9 % — ABNORMAL LOW (ref 36.0–46.0)
Hemoglobin: 10.5 g/dL — ABNORMAL LOW (ref 12.0–15.0)
MCH: 31.2 pg (ref 26.0–34.0)
MCHC: 32.9 g/dL (ref 30.0–36.0)
MCV: 94.7 fL (ref 78.0–100.0)
Platelets: 257 10*3/uL (ref 150–400)
RBC: 3.37 MIL/uL — ABNORMAL LOW (ref 3.87–5.11)
RDW: 14.8 % (ref 11.5–15.5)
WBC: 8 10*3/uL (ref 4.0–10.5)

## 2012-10-24 LAB — BASIC METABOLIC PANEL
CO2: 24 mEq/L (ref 19–32)
Chloride: 99 mEq/L (ref 96–112)
Glucose, Bld: 139 mg/dL — ABNORMAL HIGH (ref 70–99)
Potassium: 4.7 mEq/L (ref 3.5–5.1)
Sodium: 137 mEq/L (ref 135–145)

## 2012-10-24 LAB — BASIC METABOLIC PANEL WITH GFR
BUN: 64 mg/dL — ABNORMAL HIGH (ref 6–23)
Calcium: 9.5 mg/dL (ref 8.4–10.5)
Creatinine, Ser: 4.13 mg/dL — ABNORMAL HIGH (ref 0.50–1.10)
GFR calc Af Amer: 12 mL/min — ABNORMAL LOW (ref >90)
GFR calc non Af Amer: 10 mL/min — ABNORMAL LOW (ref >90)

## 2012-10-24 LAB — PRO B NATRIURETIC PEPTIDE: Pro B Natriuretic peptide (BNP): 833.4 pg/mL — ABNORMAL HIGH (ref 0–125)

## 2012-10-24 LAB — POCT I-STAT TROPONIN I: Troponin i, poc: 0 ng/mL (ref 0.00–0.08)

## 2012-10-24 MED ORDER — NITROGLYCERIN 0.4 MG SL SUBL
0.4000 mg | SUBLINGUAL_TABLET | Freq: Once | SUBLINGUAL | Status: AC
  Administered 2012-10-24: 0.4 mg via SUBLINGUAL
  Filled 2012-10-24: qty 25

## 2012-10-24 MED ORDER — IPRATROPIUM BROMIDE 0.02 % IN SOLN
0.5000 mg | Freq: Once | RESPIRATORY_TRACT | Status: AC
  Administered 2012-10-24: 0.5 mg via RESPIRATORY_TRACT
  Filled 2012-10-24: qty 2.5

## 2012-10-24 MED ORDER — DIPHENHYDRAMINE HCL 25 MG PO CAPS
50.0000 mg | ORAL_CAPSULE | Freq: Once | ORAL | Status: AC
  Administered 2012-10-24: 50 mg via ORAL
  Filled 2012-10-24: qty 2

## 2012-10-24 MED ORDER — HYDRALAZINE HCL 50 MG PO TABS
50.0000 mg | ORAL_TABLET | ORAL | Status: AC
  Administered 2012-10-24: 50 mg via ORAL
  Filled 2012-10-24: qty 1

## 2012-10-24 MED ORDER — ALBUTEROL SULFATE (5 MG/ML) 0.5% IN NEBU
5.0000 mg | INHALATION_SOLUTION | Freq: Once | RESPIRATORY_TRACT | Status: AC
  Administered 2012-10-24: 5 mg via RESPIRATORY_TRACT
  Filled 2012-10-24: qty 1

## 2012-10-24 MED ORDER — FUROSEMIDE 10 MG/ML IJ SOLN
80.0000 mg | Freq: Once | INTRAMUSCULAR | Status: AC
  Administered 2012-10-24: 80 mg via INTRAMUSCULAR
  Filled 2012-10-24: qty 8

## 2012-10-24 NOTE — ED Provider Notes (Signed)
History    CSN: [id number] Arrival date & time 10/24/12  1438  First MD Initiated Contact with Patient 10/24/12 1614     Chief Complaint  Patient presents with  . Shortness of Breath   (Consider location/radiation/quality/duration/timing/severity/associated sxs/prior Treatment) HPI Comments: Patient is a 72 year old female with history of diastolic heart failure, CKD, diabetes, hypertension, hyperlipidemia, and abdominal hernia who presents today with shortness of breath for the past 2 hours. She also complains of her legs swelling bilaterally. She has not taken any medications for her shortness of breath. She is on 80mg  of Lasix daily which she has been compliant with. Her legs have been pruritic for the past 3 days. She has been scratching them to the point of blistering. She reports that she has not been outside, changed detergent, or used new soap. She has not tried any medications to relief the itching. No cough, fever, chills, nausea, vomiting, abdominal pain. Last BM was 3 days ago. She has a problem with constipation due to her large umbilical hernia.   The history is provided by the patient. No language interpreter was used.   Past Medical History  Diagnosis Date  . Depression   . Hyperlipidemia   . Hypertension   . Diabetes mellitus   . Hernia    Past Surgical History  Procedure Laterality Date  . Hernia repair    . Abdominal hysterectomy     Family History  Problem Relation Age of Onset  . Kidney disease Mother   . COPD Father     smoke  . Cancer Father     Lung  . Hypertension Father   . Cancer Brother     prostate  . Kidney disease Sister    History  Substance Use Topics  . Smoking status: Never Smoker   . Smokeless tobacco: Never Used  . Alcohol Use: No   OB History   Grav Para Term Preterm Abortions TAB SAB Ect Mult Living                 Review of Systems  Constitutional: Negative for fever and chills.  Respiratory: Positive for shortness of  breath.   Cardiovascular: Positive for leg swelling. Negative for chest pain.  Gastrointestinal: Negative for nausea, vomiting and abdominal pain.  Skin: Positive for rash.  All other systems reviewed and are negative.    Allergies  Review of patient's allergies indicates no known allergies.  Home Medications   Current Outpatient Rx  Name  Route  Sig  Dispense  Refill  . amLODipine (NORVASC) 10 MG tablet   Oral   Take 1 tablet (10 mg total) by mouth daily.   90 tablet   3   . aspirin 81 MG tablet   Oral   Take 81 mg by mouth daily.           Marland Kitchen atorvastatin (LIPITOR) 40 MG tablet   Oral   Take 1 tablet (40 mg total) by mouth daily. For cholesterol.   90 tablet   2   . calcitRIOL (ROCALTROL) 0.25 MCG capsule   Oral   Take 0.25 mcg by mouth daily.          . carvedilol (COREG) 12.5 MG tablet   Oral   Take 12.5 mg by mouth 2 (two) times daily with a meal.         . cetirizine (ZYRTEC) 10 MG tablet   Oral   Take 1 tablet (10 mg total) by mouth  daily.   30 tablet   0   . docusate sodium (COLACE) 100 MG capsule   Oral   Take 100 mg by mouth at bedtime.          . furosemide (LASIX) 80 MG tablet   Oral   Take 1-2 tablets (80-160 mg total) by mouth 2 (two) times daily. Take two tablets at 8 AM and one tablet at 1-2 PM.         . glipiZIDE (GLUCOTROL) 10 MG tablet   Oral   Take 1 tablet (10 mg total) by mouth 2 (two) times daily before a meal.   180 tablet   3   . hydrALAZINE (APRESOLINE) 50 MG tablet   Oral   Take 2 tablets (100 mg total) by mouth 3 (three) times daily.   270 tablet   3   . insulin glargine (LANTUS) 100 UNIT/ML injection   Subcutaneous   Inject 35-40 Units into the skin at bedtime.          . isosorbide mononitrate (IMDUR) 30 MG 24 hr tablet      TAKE 1 TABLET BY MOUTH DAILY   30 tablet   5   . Multiple Vitamins-Minerals (ONE DAILY WOMENS) TABS   Oral   Take by mouth daily.           Marland Kitchen trimethoprim-polymyxin b  (POLYTRIM) ophthalmic solution   Both Eyes   Place 1 drop into both eyes every 4 (four) hours as needed. For runny eyes          BP 151/55  Pulse 65  Temp(Src) 98.4 F (36.9 C) (Oral)  Resp 18  Ht 5\' 2"  (1.575 m)  Wt 262 lb (118.842 kg)  BMI 47.91 kg/m2  SpO2 94% Physical Exam  Nursing note and vitals reviewed. Constitutional: She is oriented to person, place, and time. She appears well-developed and well-nourished. No distress.  obese  HENT:  Head: Normocephalic and atraumatic.  Right Ear: External ear normal.  Left Ear: External ear normal.  Nose: Nose normal.  Mouth/Throat: Oropharynx is clear and moist.  Eyes: Conjunctivae are normal.  Neck: Normal range of motion.  Cardiovascular: Normal rate, regular rhythm and normal heart sounds.   Pulmonary/Chest: Effort normal and breath sounds normal. No stridor. No respiratory distress. She has no wheezes. She has no rales.  Abdominal: Soft. She exhibits no distension. There is no tenderness. A hernia is present. Hernia confirmed positive in the ventral area.  Large ventral hernia   Musculoskeletal: Normal range of motion.  Neurological: She is alert and oriented to person, place, and time. She has normal strength.  Skin: Skin is warm and dry. She is not diaphoretic. No erythema.  Thicken skin on lower legs bilaterally; blisters on left leg  Psychiatric: She has a normal mood and affect. Her behavior is normal.    ED Course  Procedures (including critical care time) Labs Reviewed  CBC - Abnormal; Notable for the following:    RBC 3.37 (*)    Hemoglobin 10.5 (*)    HCT 31.9 (*)    All other components within normal limits  BASIC METABOLIC PANEL - Abnormal; Notable for the following:    Glucose, Bld 139 (*)    BUN 64 (*)    Creatinine, Ser 4.13 (*)    GFR calc non Af Amer 10 (*)    GFR calc Af Amer 12 (*)    All other components within normal limits  PRO B NATRIURETIC PEPTIDE -  Abnormal; Notable for the following:     Pro B Natriuretic peptide (BNP) 833.4 (*)    All other components within normal limits  POCT I-STAT TROPONIN I   Dg Chest 2 View  10/24/2012   *RADIOLOGY REPORT*  Clinical Data: Short of breath, lightheaded today  CHEST - 2 VIEW  Comparison: Prior chest x-ray and CT abdomen/pelvis 04/21/2012  Findings: Stable cardiomegaly and pulmonary vascular congestion without overt edema.  There may be mild bibasilar atelectasis.  No focal airspace consolidation, pleural effusion or pneumothorax. Trace atherosclerotic calcifications in the transverse aorta.  No acute osseous abnormality.  IMPRESSION:  No acute cardiopulmonary disease.  Stable cardiomegaly and pulmonary vascular congestion without overt edema.   Original Report Authenticated By: Jacqulynn Cadet, M.D.   1. Shortness of breath   2. Diastolic heart failure, acute on chronic   3. CKD (chronic kidney disease), unspecified stage     MDM  Patient presents with shortness of breath with hx of diastolic heart failure and CKD. Suspect worsening of heart failure. Follow up at heart failure clinic tomorrow. Given additional lasix in ED. Patient signed out to Junius Creamer, NP at shift change. If feeling better after lasix d/c home. Dr. Dorna Mai evaluated patient and agrees with plan. Patient / Family / Caregiver informed of clinical course, understand medical decision-making process, and agree with plan.   Elwyn Lade, PA-C 10/25/12 2045

## 2012-10-24 NOTE — ED Notes (Signed)
Stressed the importance of weighing herself daily and recording those weights to report to the heart failure clinic if there is a 3-5lb increase over a 2-3 day period she must call her heart failure doctor. Patient verbalized understanding and stated that she would begin weighing every day

## 2012-10-24 NOTE — ED Notes (Signed)
Patient ambulated in the hall again she tolerated the walk without getting short of breath like she did previously.  SPO2 remained 94-95% while ambulating.

## 2012-10-24 NOTE — ED Notes (Signed)
Gave pt 2 liters of oxygen (nasal cannula) due to complaining of shortness of breath

## 2012-10-24 NOTE — ED Notes (Signed)
Urinary output of 225cc

## 2012-10-24 NOTE — ED Notes (Signed)
Pt c/o feeling light headed and SOB x2 hours. Pt also reports rash and itching to BLE x2 days

## 2012-10-24 NOTE — ED Notes (Signed)
Respiratory therapy at bedside.

## 2012-10-24 NOTE — ED Provider Notes (Signed)
Patient was given IV Lasix.  Since that, time.  She's been able to ambulate without having to stop to catch her breath.  Her O2 sats has stayed between 9495%.  She's been encouraged to weigh herself on a daily basis.  She does have a scale as instructed to do this before  Garald Balding, NP 10/24/12 2155

## 2012-10-24 NOTE — ED Notes (Signed)
Ambulated pt around the unit pts O2 saturations remained at 95% (room air) while ambulating on the unit reapplied Oxygen (nasal cannula) upon returning to room.

## 2012-10-24 NOTE — ED Provider Notes (Addendum)
Medical screening examination/treatment/procedure(s) were conducted as a shared visit with non-physician practitioner(s) and myself.  I personally evaluated the patient during the encounter  Pt with h/o diastolic CHF, reports worse SOB today, no sig fever, cough, CP.  She has h/o renal insuff, obesity, takes lasix 160 mg in AM, 80 mg in evening.  Has not been seen in CHF clinic in a few months according to her.  Also followed by nephrology.  Pt's troponin here is neg.  Initial BP is mildly elevated, but has gotten worse while in the ED.  CXR shows no infiltrates, however BNP is slightly more elevated, renal insuff is slightly worse.  Will diurese some in the ED with 80 mg of IV lasix, I expect some reduction in BP and diuresis.  Pt has already ambulated in the ED without drop in O2 sats, I don't feel pt requires admission and pt doesn't desire admission anyway.  Will give SL NTG and give home dose of hydralazine as well.  Anticipate discharge to home and pt instructed to make an appt and follow up with CHF clinic this week.    ECG at time 20:18 shows NSR at rate 75, borderline low QRS voltages frontal leads, no ST or T wave abns, normal intervals otherwise.  No sig change from priors.  CXR today shows no change in usual cardiomegaly.    Saddie Benders. Dorna Mai, MD 10/24/12 [place] Dorna Mai, MD 10/24/12 2024

## 2012-10-24 NOTE — ED Notes (Signed)
Patient ambulated on room air SPO2 93-96% patient became short of breath after ambulating 131ft which she recovered after resting for a few minutes and continued to ambulate until returning back to the room

## 2012-10-27 ENCOUNTER — Ambulatory Visit (HOSPITAL_COMMUNITY)
Admission: RE | Admit: 2012-10-27 | Discharge: 2012-10-27 | Disposition: A | Discharge status: Home / Self Care | Payer: Medicare Other | Source: Ambulatory Visit | Attending: Internal Medicine | Admitting: Internal Medicine

## 2012-10-27 ENCOUNTER — Encounter (HOSPITAL_COMMUNITY): Payer: Self-pay

## 2012-10-27 VITALS — BP 120/62 | HR 66 | Wt 269.8 lb

## 2012-10-27 DIAGNOSIS — I5032 Chronic diastolic (congestive) heart failure: Secondary | ICD-10-CM

## 2012-10-27 DIAGNOSIS — I129 Hypertensive chronic kidney disease with stage 1 through stage 4 chronic kidney disease, or unspecified chronic kidney disease: Secondary | ICD-10-CM | POA: Insufficient documentation

## 2012-10-27 DIAGNOSIS — E119 Type 2 diabetes mellitus without complications: Secondary | ICD-10-CM | POA: Insufficient documentation

## 2012-10-27 DIAGNOSIS — Z794 Long term (current) use of insulin: Secondary | ICD-10-CM | POA: Insufficient documentation

## 2012-10-27 DIAGNOSIS — Z79899 Other long term (current) drug therapy: Secondary | ICD-10-CM | POA: Insufficient documentation

## 2012-10-27 DIAGNOSIS — I5022 Chronic systolic (congestive) heart failure: Secondary | ICD-10-CM | POA: Insufficient documentation

## 2012-10-27 DIAGNOSIS — N184 Chronic kidney disease, stage 4 (severe): Secondary | ICD-10-CM | POA: Insufficient documentation

## 2012-10-27 MED ORDER — METOLAZONE 2.5 MG PO TABS: 2.5000 mg | ORAL_TABLET | ORAL | Status: DC

## 2012-10-27 NOTE — Addendum Note (Signed)
Encounter addended by: Scarlette Calico, RN on: 10/27/2012 12:06 PM<BR>     Documentation filed: Patient Instructions Section, Orders

## 2012-10-27 NOTE — Assessment & Plan Note (Signed)
She is doing fairly well. I think she still has some fluid on board. Will add metolazone 2.5 mg 1x/week to help with fluid removal. She can also take an extra dose occasionally if weight going up. I suspect she may be getting close to the point where she could need HD but is not there yet. She will continue to follow with Dr. Moshe Cipro. May benefit from a sleep study at some point.

## 2012-10-27 NOTE — Progress Notes (Signed)
Patient ID: Rachel Hobbs, female   DOB: 1940/05/27, 72 y.o.   MRN: [id number]  Nephrologist: Dr. Moshe Cipro PCP: Dr. Ferd Glassing ([facility])  HPI:  She is referred to HF clinic by Dr Beckie Salts ([place]) for HF management.   Rachel Hobbs is a 72 year old with a PMH of morbid obesity, chronic diastolic heart failure, DM, CKD stage IV (baseline 3.4-3.8), HTN, and abdominal hernia (CT abdomen 04/2011). She is not on Ace/Arb due to CKD.   ECHO 06/2011 EF [id number] Grade II diastolic heart failure. RV normal   Admitted to [facility] 04/21/2012 due to dyspnea. Pro BNP 720.8 Diuresed with IV lasix and transitioned to Lasix 160 mg in am and 80 mg at night. Discharged weight 284 pounds. D/C Creatinine 4.03   She returns for follow up today.  On July 8 was seen in the ER with increasing SOB and swelling. BNP slightly up from baseline (720-> 833). Was given 80mg  IV lasix and had about 500cc out. Felt some better and discharged home. Cr up to 4.1. BUN 64. No uremic sx.  She feels pretty good. If she walks too fast gets SOB and has to stop. Weighing every day. Weight stable at 261-262.  She has not taken extra fluid pills.  She has chronic orthopnea.  No PND.  No edema.  Sees Dr. Moshe Cipro next month.  Cr 2 weeks ago stable at 3.82. Denies known OSA  ROS: All systems negative except as listed in HPI, PMH and Problem List.  Past Medical History  Diagnosis Date  . Depression   . Hyperlipidemia   . Hypertension   . Diabetes mellitus   . Hernia     Current Outpatient Prescriptions  Medication Sig Dispense Refill  . acetaminophen (TYLENOL) 500 MG tablet Take 1,000 mg by mouth every 6 (six) hours as needed for pain.      Marland Kitchen amLODipine (NORVASC) 10 MG tablet Take 1 tablet (10 mg total) by mouth daily.  90 tablet  3  . atorvastatin (LIPITOR) 40 MG tablet Take 1 tablet (40 mg total) by mouth daily. For cholesterol.  90 tablet  2  . calcitRIOL (ROCALTROL) 0.25 MCG capsule Take 0.25 mcg by mouth daily.       . carvedilol  (COREG) 12.5 MG tablet Take 12.5 mg by mouth 2 (two) times daily with a meal.      . cetirizine (ZYRTEC) 10 MG tablet Take 1 tablet (10 mg total) by mouth daily.  30 tablet  0  . docusate sodium (COLACE) 100 MG capsule Take 100 mg by mouth at bedtime.       . furosemide (LASIX) 80 MG tablet Take 1-2 tablets (80-160 mg total) by mouth 2 (two) times daily. Take two tablets at 8 AM and one tablet at 1-2 PM.      . glipiZIDE (GLUCOTROL) 10 MG tablet Take 1 tablet (10 mg total) by mouth 2 (two) times daily before a meal.  180 tablet  3  . hydrALAZINE (APRESOLINE) 50 MG tablet Take 2 tablets (100 mg total) by mouth 3 (three) times daily.  270 tablet  3  . insulin glargine (LANTUS) 100 UNIT/ML injection Inject 35-40 Units into the skin at bedtime.       . isosorbide mononitrate (IMDUR) 30 MG 24 hr tablet TAKE 1 TABLET BY MOUTH DAILY  30 tablet  5  . Multiple Vitamins-Minerals (ONE DAILY WOMENS) TABS Take by mouth daily.        Marland Kitchen trimethoprim-polymyxin b (POLYTRIM) ophthalmic solution Place  1 drop into both eyes every 4 (four) hours as needed. For runny eyes      . aspirin 81 MG tablet Take 81 mg by mouth daily.         No current facility-administered medications for this encounter.     PHYSICAL EXAM: Filed Vitals:   10/27/12 1129  BP: 120/62  Pulse: 66  Weight: 269 lb 12.8 oz (122.38 kg)  SpO2: 99%    General:  Looks young her than stated age. Sitting in [place]. No resp difficulty HEENT: normal Neck: supple. Thick  JVP appears up to 9-10 but difficult to assess due to body habitus. Carotids 2+ bilaterally; no bruits. No lymphadenopathy or thryomegaly appreciated. Cor: PMI nonpalpabe Regular rate & rhythm. No rubs, gallops or murmurs. Lungs: clear Abdomen: soft, nontender, nondistended. No hepatosplenomegaly. No bruits or masses. Good bowel sounds. Extremities: no cyanosis, clubbing, rash, Chronic venous stasis changes in ankles.  Neuro: alert & orientedx3, cranial nerves grossly intact. Moves  all 4 extremities w/o difficulty. Affect pleasant.    ASSESSMENT & PLAN:

## 2012-10-27 NOTE — Patient Instructions (Addendum)
Start Metolazone 2.5 mg once a week and as needed  We will contact you in 3 months to schedule your next appointment.

## 2012-11-02 ENCOUNTER — Other Ambulatory Visit: Payer: Self-pay | Admitting: Family Medicine

## 2012-11-02 DIAGNOSIS — Z1231 Encounter for screening mammogram for malignant neoplasm of breast: Secondary | ICD-10-CM

## 2012-11-10 ENCOUNTER — Encounter (HOSPITAL_COMMUNITY)
Admission: RE | Admit: 2012-11-10 | Discharge: 2012-11-10 | Disposition: A | Discharge status: Home / Self Care | Payer: Medicare Other | Source: Ambulatory Visit | Attending: Nephrology | Admitting: Nephrology

## 2012-11-10 LAB — IRON AND TIBC
Iron: 78 ug/dL (ref 42–135)
Saturation Ratios: 36 % (ref 20–55)

## 2012-11-10 LAB — RENAL FUNCTION PANEL
CO2: 25 mEq/L (ref 19–32)
Calcium: 9.4 mg/dL (ref 8.4–10.5)
Chloride: 99 mEq/L (ref 96–112)
GFR calc Af Amer: 10 mL/min — ABNORMAL LOW (ref >90)
GFR calc non Af Amer: 8 mL/min — ABNORMAL LOW (ref >90)
Potassium: 4.3 mEq/L (ref 3.5–5.1)
Sodium: 138 mEq/L (ref 135–145)

## 2012-11-10 MED ORDER — EPOETIN ALFA 20000 UNIT/ML IJ SOLN: 20000.0000 [IU] | INTRAMUSCULAR | Status: DC

## 2012-11-10 MED ORDER — EPOETIN ALFA 20000 UNIT/ML IJ SOLN
INTRAMUSCULAR | Status: AC
  Administered 2012-11-10: 20000 [IU] via SUBCUTANEOUS
  Filled 2012-11-10: qty 1

## 2012-11-23 ENCOUNTER — Other Ambulatory Visit (HOSPITAL_COMMUNITY): Payer: Self-pay

## 2012-11-24 ENCOUNTER — Encounter (HOSPITAL_COMMUNITY)
Admission: RE | Admit: 2012-11-24 | Discharge: 2012-11-24 | Disposition: A | Discharge status: Home / Self Care | Payer: Medicare Other | Source: Ambulatory Visit | Attending: Nephrology | Admitting: Nephrology

## 2012-11-24 DIAGNOSIS — D638 Anemia in other chronic diseases classified elsewhere: Secondary | ICD-10-CM | POA: Insufficient documentation

## 2012-11-24 DIAGNOSIS — N184 Chronic kidney disease, stage 4 (severe): Secondary | ICD-10-CM | POA: Insufficient documentation

## 2012-11-24 MED ORDER — EPOETIN ALFA 20000 UNIT/ML IJ SOLN: 20000.0000 [IU] | INTRAMUSCULAR | Status: DC

## 2012-11-24 MED ORDER — EPOETIN ALFA 20000 UNIT/ML IJ SOLN
INTRAMUSCULAR | Status: AC
  Administered 2012-11-24: 20000 [IU] via SUBCUTANEOUS
  Filled 2012-11-24: qty 1

## 2012-12-01 ENCOUNTER — Encounter (HOSPITAL_COMMUNITY): Payer: Medicare Other

## 2012-12-08 ENCOUNTER — Encounter (HOSPITAL_COMMUNITY)
Admission: RE | Admit: 2012-12-08 | Discharge: 2012-12-08 | Disposition: A | Discharge status: Home / Self Care | Payer: Medicare Other | Source: Ambulatory Visit | Attending: Nephrology | Admitting: Nephrology

## 2012-12-08 LAB — IRON AND TIBC
Iron: 58 ug/dL (ref 42–135)
Saturation Ratios: 26 % (ref 20–55)
TIBC: 219 ug/dL — ABNORMAL LOW (ref 250–470)

## 2012-12-08 LAB — RENAL FUNCTION PANEL
Albumin: 3.5 g/dL (ref 3.5–5.2)
BUN: 71 mg/dL — ABNORMAL HIGH (ref 6–23)
CO2: 25 mEq/L (ref 19–32)
Chloride: 99 mEq/L (ref 96–112)
Potassium: 3.9 mEq/L (ref 3.5–5.1)

## 2012-12-08 LAB — FERRITIN: Ferritin: 269 ng/mL (ref 10–291)

## 2012-12-08 MED ORDER — EPOETIN ALFA 20000 UNIT/ML IJ SOLN
INTRAMUSCULAR | Status: AC
  Administered 2012-12-08: 20000 [IU] via SUBCUTANEOUS
  Filled 2012-12-08: qty 1

## 2012-12-08 MED ORDER — EPOETIN ALFA 20000 UNIT/ML IJ SOLN: 20000.0000 [IU] | INTRAMUSCULAR | Status: DC

## 2012-12-11 ENCOUNTER — Encounter (HOSPITAL_COMMUNITY): Payer: Medicare Other

## 2012-12-13 ENCOUNTER — Other Ambulatory Visit: Payer: Self-pay | Admitting: Family Medicine

## 2012-12-13 ENCOUNTER — Ambulatory Visit (HOSPITAL_COMMUNITY)
Admission: RE | Admit: 2012-12-13 | Discharge: 2012-12-13 | Disposition: A | Discharge status: Home / Self Care | Payer: Medicare Other | Source: Ambulatory Visit | Attending: Family Medicine | Admitting: Family Medicine

## 2012-12-13 DIAGNOSIS — Z1231 Encounter for screening mammogram for malignant neoplasm of breast: Secondary | ICD-10-CM | POA: Insufficient documentation

## 2012-12-19 ENCOUNTER — Encounter: Payer: Self-pay | Admitting: Sports Medicine

## 2012-12-19 ENCOUNTER — Ambulatory Visit (INDEPENDENT_AMBULATORY_CARE_PROVIDER_SITE_OTHER): Payer: Medicare Other | Admitting: Sports Medicine

## 2012-12-19 VITALS — BP 174/56 | HR 75 | Temp 99.3°F | Ht 62.0 in | Wt 262.0 lb

## 2012-12-19 DIAGNOSIS — I872 Venous insufficiency (chronic) (peripheral): Secondary | ICD-10-CM

## 2012-12-19 DIAGNOSIS — I10 Essential (primary) hypertension: Secondary | ICD-10-CM

## 2012-12-19 DIAGNOSIS — I5030 Unspecified diastolic (congestive) heart failure: Secondary | ICD-10-CM

## 2012-12-19 DIAGNOSIS — I509 Heart failure, unspecified: Secondary | ICD-10-CM

## 2012-12-19 NOTE — Patient Instructions (Addendum)
It was nice to meet you today.  Please go get new compression socks at [facility].  You should put these on each morning before your feet hit the floor.  I also would recommend you use a moisturize her such as Eucerin cream or Vaseline ointment.  Uses at least twice per day over your entire lower extremities.  This will help with the itching.  Please followup with Dr. Berkley Harvey as soon as possible to further discuss your ongoing medical care.  Please follow up with Dr. Lyda Kalata and Dr. Missy Sabins as scheduled.

## 2012-12-19 NOTE — Progress Notes (Signed)
  [facility]  Patient name: Rachel Hobbs MRN [id number]  Date of birth: February 07, 1941  CC & HPI:  Rachel Hobbs is a 72 y.o. female presenting to clinic.  Chief Complaint  Patient presents with  . Edema    AND RASH/BOTH LEGS  Patient reports 2 week history of significant lower extremity itching.  She reports the seems to be has begun once her lower extremity swelling improved with medication adjustments per Dr. Haroldine Laws which included adding metolazone q once per week.  Pt denies chest pain, dyspnea at rest or exertion, PND, and significantly improved larger medium.  This itching is accompanied by a darkening of her bilateral lower extremities.  She has not tried anything for it.  She denies any weeping, open wounds or significant pain. Pt denies any fevers, chills, or rigors.   ROS:  PER HPI  Pertinent History Reviewed:  Medical & Surgical Hx:  Reviewed: Significant for chronic diastolic heart failure followed by the heart failure team, CKD followed by Dr. Moshe Cipro with stable kidney function on last check. Medications: Reviewed & Updated - see associated section Social History: Reviewed -  reports that she has never smoked. She has never used smokeless tobacco.  Objective Findings:  Vitals: BP 174/56  Pulse 75  Temp(Src) 99.3 F (37.4 C) (Oral)  Ht 5\' 2"  (1.575 m)  Wt 262 lb (118.842 kg)  BMI 47.91 kg/m2 PE: GENERAL:  adult obese African American female. In no discomfort; no respiratory distress  PSYCH:  alert and appropriate, good insight   HNEENT:   no significant JVD noted.    CARDIO:  RRR, S1/S2 heard, no murmur  LUNGS:  CTA B, no wheezes, no crackles  ABDOMEN:    EXTREM:  chronic venous stasis changes at the ankles and medial distal lower extremities without evidence of weeping, open wounds, erythema or fluctuance.  Dorsalis pedis and posterior tibialis pulses are 2+/4, she has good capillary refill .  1+ low extremity edema     Assessment & Plan:   No diagnosis found. See problem associated charting

## 2012-12-21 DIAGNOSIS — I872 Venous insufficiency (chronic) (peripheral): Secondary | ICD-10-CM | POA: Insufficient documentation

## 2012-12-21 NOTE — Assessment & Plan Note (Addendum)
Continue followup with heart failure team, no evidence of volume overload today.

## 2012-12-21 NOTE — Assessment & Plan Note (Signed)
I suspect that her lower extremity itching is secondary to chronic venous stasis changes.  It appears that she has had a significant improvement in her lower extremity edema she is likely having symptoms of itching from improve peripheral blood flow due to the decreased interstitial pressure. - Start lower extremity compression socks.  I have low concerns for occlusive PAD and the patient will likely benefit from it. > Consider if not improved her itching may be secondary to her poor renal function given any focal symptoms favor this is due to venous insufficiency.

## 2012-12-22 ENCOUNTER — Encounter (HOSPITAL_COMMUNITY)
Admission: RE | Admit: 2012-12-22 | Discharge: 2012-12-22 | Disposition: A | Discharge status: Home / Self Care | Payer: Medicare Other | Source: Ambulatory Visit | Attending: Nephrology | Admitting: Nephrology

## 2012-12-22 DIAGNOSIS — D638 Anemia in other chronic diseases classified elsewhere: Secondary | ICD-10-CM | POA: Insufficient documentation

## 2012-12-22 DIAGNOSIS — N184 Chronic kidney disease, stage 4 (severe): Secondary | ICD-10-CM | POA: Insufficient documentation

## 2012-12-22 LAB — POCT HEMOGLOBIN-HEMACUE: Hemoglobin: 10.4 g/dL — ABNORMAL LOW (ref 12.0–15.0)

## 2012-12-22 MED ORDER — EPOETIN ALFA 20000 UNIT/ML IJ SOLN
INTRAMUSCULAR | Status: AC
  Administered 2012-12-22: 20000 [IU] via SUBCUTANEOUS
  Filled 2012-12-22: qty 1

## 2012-12-22 MED ORDER — EPOETIN ALFA 20000 UNIT/ML IJ SOLN: 20000.0000 [IU] | INTRAMUSCULAR | Status: DC

## 2013-01-01 ENCOUNTER — Other Ambulatory Visit: Payer: Self-pay | Admitting: Family Medicine

## 2013-01-02 ENCOUNTER — Telehealth: Payer: Self-pay | Admitting: Family Medicine

## 2013-01-02 NOTE — Telephone Encounter (Signed)
Pt called because the pharmacy is waiting for [place] to return a call so that she can get her medication. jw

## 2013-01-02 NOTE — Telephone Encounter (Signed)
Left message for pt to call back with the name of medication needing refilled.  Rachel Hobbs,CMA

## 2013-01-03 NOTE — Telephone Encounter (Signed)
Will forward to MD. Jazmin Hartsell,CMA  

## 2013-01-03 NOTE — Telephone Encounter (Signed)
Please call to advise patient to have all blood pressure and heart failure medications refilled by her cardiologist (Heart Failure Team)

## 2013-01-03 NOTE — Telephone Encounter (Signed)
She says its the medicine for her heart.

## 2013-01-04 ENCOUNTER — Telehealth: Payer: Self-pay | Admitting: Family Medicine

## 2013-01-04 NOTE — Telephone Encounter (Signed)
Pt has come in stating that the pharmacy have not received a response concerning her blood pressure and heart medicine.

## 2013-01-04 NOTE — Telephone Encounter (Signed)
Patient has been seen by Cardiology / Heart Failure team who has been adjusting her medications. She will need to contact them for refills. I'm not sure what and how much "heart and blood pressure" meds she was prescribed.  Thanks,  [employer]

## 2013-01-04 NOTE — Telephone Encounter (Signed)
Pt is unsure of the names of these meds, can the ones pertaining to this be filled?  Rachel Hobbs,CMA

## 2013-01-05 ENCOUNTER — Other Ambulatory Visit: Payer: Self-pay | Admitting: Family Medicine

## 2013-01-05 ENCOUNTER — Ambulatory Visit (INDEPENDENT_AMBULATORY_CARE_PROVIDER_SITE_OTHER): Payer: Medicare Other | Admitting: Family Medicine

## 2013-01-05 ENCOUNTER — Encounter (HOSPITAL_COMMUNITY): Payer: Self-pay

## 2013-01-05 ENCOUNTER — Emergency Department (HOSPITAL_COMMUNITY)
Admission: EM | Admit: 2013-01-05 | Discharge: 2013-01-05 | Disposition: A | Discharge status: Home / Self Care | Payer: Medicare Other | Attending: Emergency Medicine | Admitting: Emergency Medicine

## 2013-01-05 ENCOUNTER — Encounter (HOSPITAL_COMMUNITY)
Admission: RE | Admit: 2013-01-05 | Discharge: 2013-01-05 | Disposition: A | Discharge status: Home / Self Care | Payer: Medicare Other | Source: Ambulatory Visit | Attending: Nephrology | Admitting: Nephrology

## 2013-01-05 ENCOUNTER — Telehealth: Payer: Self-pay | Admitting: Family Medicine

## 2013-01-05 ENCOUNTER — Emergency Department (HOSPITAL_COMMUNITY): Payer: Medicare Other

## 2013-01-05 VITALS — BP 145/76 | HR 67 | Temp 98.6°F | Ht 62.0 in | Wt 261.2 lb

## 2013-01-05 DIAGNOSIS — Z8719 Personal history of other diseases of the digestive system: Secondary | ICD-10-CM | POA: Insufficient documentation

## 2013-01-05 DIAGNOSIS — I10 Essential (primary) hypertension: Secondary | ICD-10-CM | POA: Insufficient documentation

## 2013-01-05 DIAGNOSIS — Z79899 Other long term (current) drug therapy: Secondary | ICD-10-CM | POA: Insufficient documentation

## 2013-01-05 DIAGNOSIS — E162 Hypoglycemia, unspecified: Secondary | ICD-10-CM

## 2013-01-05 DIAGNOSIS — E119 Type 2 diabetes mellitus without complications: Secondary | ICD-10-CM

## 2013-01-05 DIAGNOSIS — F329 Major depressive disorder, single episode, unspecified: Secondary | ICD-10-CM | POA: Insufficient documentation

## 2013-01-05 DIAGNOSIS — F3289 Other specified depressive episodes: Secondary | ICD-10-CM | POA: Insufficient documentation

## 2013-01-05 DIAGNOSIS — Z7982 Long term (current) use of aspirin: Secondary | ICD-10-CM | POA: Insufficient documentation

## 2013-01-05 DIAGNOSIS — E785 Hyperlipidemia, unspecified: Secondary | ICD-10-CM | POA: Insufficient documentation

## 2013-01-05 DIAGNOSIS — E1169 Type 2 diabetes mellitus with other specified complication: Secondary | ICD-10-CM | POA: Insufficient documentation

## 2013-01-05 DIAGNOSIS — Z794 Long term (current) use of insulin: Secondary | ICD-10-CM | POA: Insufficient documentation

## 2013-01-05 LAB — POCT I-STAT, CHEM 8
BUN: 63 mg/dL — ABNORMAL HIGH (ref 6–23)
Calcium, Ion: 1.14 mmol/L (ref 1.13–1.30)
Chloride: 106 mEq/L (ref 96–112)
Creatinine, Ser: 4.7 mg/dL — ABNORMAL HIGH (ref 0.50–1.10)
Glucose, Bld: 144 mg/dL — ABNORMAL HIGH (ref 70–99)
HCT: 36 % (ref 36.0–46.0)
TCO2: 25 mmol/L (ref 0–100)

## 2013-01-05 LAB — COMPREHENSIVE METABOLIC PANEL
ALT: 16 U/L (ref 0–35)
AST: 17 U/L (ref 0–37)
Alkaline Phosphatase: 164 U/L — ABNORMAL HIGH (ref 39–117)
CO2: 27 mEq/L (ref 19–32)
Calcium: 9.5 mg/dL (ref 8.4–10.5)
Chloride: 104 mEq/L (ref 96–112)
GFR calc Af Amer: 10 mL/min — ABNORMAL LOW (ref >90)
GFR calc non Af Amer: 9 mL/min — ABNORMAL LOW (ref >90)
Glucose, Bld: 145 mg/dL — ABNORMAL HIGH (ref 70–99)
Potassium: 4 mEq/L (ref 3.5–5.1)
Sodium: 143 mEq/L (ref 135–145)
Total Bilirubin: 0.3 mg/dL (ref 0.3–1.2)

## 2013-01-05 LAB — CBC
Hemoglobin: 11.6 g/dL — ABNORMAL LOW (ref 12.0–15.0)
MCH: 31.5 pg (ref 26.0–34.0)
RBC: 3.68 MIL/uL — ABNORMAL LOW (ref 3.87–5.11)
WBC: 8.4 10*3/uL (ref 4.0–10.5)

## 2013-01-05 LAB — GLUCOSE, CAPILLARY: Glucose-Capillary: 299 mg/dL — ABNORMAL HIGH (ref 70–99)

## 2013-01-05 LAB — POCT GLYCOSYLATED HEMOGLOBIN (HGB A1C): Hemoglobin A1C: 6.3

## 2013-01-05 MED ORDER — CLONIDINE HCL 0.1 MG PO TABS: 0.1000 mg | ORAL_TABLET | Freq: Once | ORAL | Status: DC | PRN

## 2013-01-05 MED ORDER — CLONIDINE HCL 0.1 MG PO TABS
ORAL_TABLET | ORAL | Status: AC
  Filled 2013-01-05: qty 1

## 2013-01-05 MED ORDER — EPOETIN ALFA 20000 UNIT/ML IJ SOLN: 20000.0000 [IU] | INTRAMUSCULAR | Status: DC

## 2013-01-05 NOTE — ED Provider Notes (Signed)
CSN: [id number]     Arrival date & time 01/05/13  0436 History   None    Chief Complaint  Patient presents with  . Hypoglycemia   (Consider location/radiation/quality/duration/timing/severity/associated sxs/prior Treatment) HPI History provided by patient. Insulin-dependent diabetic - her blood sugar monitor ran out of batteries 2 weeks ago. Since that time she has been giving herself insulin 30 units after meals and also her nighttime Lantus. Over last 2 weeks, she has been unable to check her sugars. Tonight at home had dinner and gave herself 30 units around 9 PM and went to bed. Her daughter was unable to wake her up in the middle of night and became concerned and called EMS. Per EMS, Blood sugar was 50. Patient denies any symptoms prior to going to sleep. No chest pain or shortness of breath. No fevers or recent illness. She currently denies any complaints. She is concerned that her baseline insulin dosing is too high with relatively low blood sugars in the mornings. Past Medical History  Diagnosis Date  . Depression   . Hyperlipidemia   . Hypertension   . Diabetes mellitus   . Hernia    Past Surgical History  Procedure Laterality Date  . Hernia repair    . Abdominal hysterectomy     Family History  Problem Relation Age of Onset  . Kidney disease Mother   . COPD Father     smoke  . Cancer Father     Lung  . Hypertension Father   . Cancer Brother     prostate  . Kidney disease Sister    History  Substance Use Topics  . Smoking status: Never Smoker   . Smokeless tobacco: Never Used  . Alcohol Use: No   OB History   Grav Para Term Preterm Abortions TAB SAB Ect Mult Living                 Review of Systems  Constitutional: Negative for fever and chills.  HENT: Negative for neck pain and neck stiffness.   Eyes: Negative for pain.  Respiratory: Negative for shortness of breath.   Cardiovascular: Negative for chest pain.  Gastrointestinal: Negative for abdominal  pain.  Genitourinary: Negative for dysuria.  Musculoskeletal: Negative for back pain.  Skin: Negative for rash.  Neurological: Negative for headaches.  All other systems reviewed and are negative.    Allergies  Review of patient's allergies indicates no known allergies.  Home Medications   Current Outpatient Rx  Name  Route  Sig  Dispense  Refill  . acetaminophen (TYLENOL) 500 MG tablet   Oral   Take 1,000 mg by mouth every 6 (six) hours as needed for pain.         Marland Kitchen amLODipine (NORVASC) 10 MG tablet   Oral   Take 1 tablet (10 mg total) by mouth daily.   90 tablet   3   . aspirin 81 MG tablet   Oral   Take 81 mg by mouth daily.           Marland Kitchen atorvastatin (LIPITOR) 40 MG tablet   Oral   Take 1 tablet (40 mg total) by mouth daily. For cholesterol.   90 tablet   2   . calcitRIOL (ROCALTROL) 0.25 MCG capsule   Oral   Take 0.25 mcg by mouth daily.          . carvedilol (COREG) 12.5 MG tablet   Oral   Take 12.5 mg by mouth 2 (two) times  daily with a meal.         . cetirizine (ZYRTEC) 10 MG tablet   Oral   Take 1 tablet (10 mg total) by mouth daily.   30 tablet   0   . docusate sodium (COLACE) 100 MG capsule   Oral   Take 100 mg by mouth at bedtime.          . furosemide (LASIX) 80 MG tablet   Oral   Take 1-2 tablets (80-160 mg total) by mouth 2 (two) times daily. Take two tablets at 8 AM and one tablet at 1-2 PM.         . glipiZIDE (GLUCOTROL) 10 MG tablet   Oral   Take 1 tablet (10 mg total) by mouth 2 (two) times daily before a meal.   180 tablet   3   . hydrALAZINE (APRESOLINE) 50 MG tablet   Oral   Take 50 mg by mouth 3 (three) times daily.         . insulin glargine (LANTUS) 100 UNIT/ML injection   Subcutaneous   Inject 35-40 Units into the skin at bedtime.          . isosorbide mononitrate (IMDUR) 30 MG 24 hr tablet   Oral   Take 30 mg by mouth daily.         . metolazone (ZAROXOLYN) 2.5 MG tablet   Oral   Take 1 tablet  (2.5 mg total) by mouth once a week. And as needed   10 tablet   3   . Multiple Vitamins-Minerals (ONE DAILY WOMENS) TABS   Oral   Take by mouth daily.           Marland Kitchen oxymetazoline (AFRIN) 0.05 % nasal spray   Nasal   Place 2 sprays into the nose 2 (two) times daily.         Marland Kitchen trimethoprim-polymyxin b (POLYTRIM) ophthalmic solution   Both Eyes   Place 1 drop into both eyes every 4 (four) hours as needed. For runny eyes          BP 163/64  Resp 20  SpO2 100% Physical Exam  Constitutional: She is oriented to person, place, and time. She appears well-developed and well-nourished.  HENT:  Head: Normocephalic and atraumatic.  Eyes: EOM are normal. Pupils are equal, round, and reactive to light.  Neck: Neck supple.  Cardiovascular: Normal rate, regular rhythm and intact distal pulses.   Pulmonary/Chest: Effort normal and breath sounds normal. No respiratory distress.  Abdominal: Soft. She exhibits no distension. There is no tenderness.  Musculoskeletal: Normal range of motion. She exhibits no edema.  Neurological: She is alert and oriented to person, place, and time.  Skin: Skin is warm and dry.    ED Course  Procedures (including critical care time) Labs Review Labs Reviewed  CBC - Abnormal; Notable for the following:    RBC 3.68 (*)    Hemoglobin 11.6 (*)    HCT 34.5 (*)    All other components within normal limits  COMPREHENSIVE METABOLIC PANEL - Abnormal; Notable for the following:    Glucose, Bld 145 (*)    BUN 64 (*)    Creatinine, Ser 4.50 (*)    Alkaline Phosphatase 164 (*)    GFR calc non Af Amer 9 (*)    GFR calc Af Amer 10 (*)    All other components within normal limits  GLUCOSE, CAPILLARY - Abnormal; Notable for the following:    Glucose-Capillary 139 (*)  All other components within normal limits  GLUCOSE, CAPILLARY - Abnormal; Notable for the following:    Glucose-Capillary 299 (*)    All other components within normal limits  POCT I-STAT, CHEM 8  - Abnormal; Notable for the following:    BUN 63 (*)    Creatinine, Ser 4.70 (*)    Glucose, Bld 144 (*)    All other components within normal limits   Imaging Review Dg Chest Portable 1 View  01/05/2013   *RADIOLOGY REPORT*  Clinical Data: Hypoglycemia  PORTABLE CHEST - 1 VIEW  Comparison: Prior radiograph from 10/24/2012  Findings: Cardiomegaly is stable as compared to prior studies.  Lungs are normally inflated.  No pulmonary edema or pleural effusion.  Left basilar opacity likely reflects atelectasis.  No focal infiltrate identified.  No pneumothorax.  No acute osseous abnormality.  IMPRESSION: 1.  Stable cardiomegaly without pulmonary edema. 2.  Left basilar atelectasis.   Original Report Authenticated By: Jeannine Boga, M.D.      Date: 01/05/2013  Rate: 62  Rhythm: normal sinus rhythm  QRS Axis: normal  Intervals: normal  ST/T Wave abnormalities: nonspecific ST changes  Conduction Disutrbances:none  Narrative Interpretation:   Old EKG Reviewed: unchanged  5:47 AM d/w FP resident on call, patient can be seen today in clinic. She will call to be evaluated in a "same-day clinic"  After a meal and serial CBGs is doing well, no complaints, normal exam - PT agrees to call in am for f/u in clinic today MDM  Dx: hypoglycemia - resolved  ECG, labs, CXR Meal provided Serial evaluations  VS and nurses notes reviewed    Teressa Lower, MD 01/05/13 2306

## 2013-01-05 NOTE — ED Notes (Signed)
Pt. Gave RN phone numbers to call for her daughter.  Called [phone] and left a message.

## 2013-01-05 NOTE — Patient Instructions (Addendum)
Your A1c is 6.3. This shows that your sugar for the past 3 months has been controlled.  Since you have this episode of hypoglycemia I recommend you to decrease the dose of insulin to 15 units daily. Please check your blood sugars 4 times a day: breakfast, lunch, dinner and before bed and keep a log. If your sugars at any point are below 100 take 2 units of insulin from your next dose.  For example if you injected 15 units and  your blood sugars are below 100 your next dose will be 13 units. Make a followup appointment with your primary care doctor in one week to better adjust your diabetes regimen.

## 2013-01-05 NOTE — ED Notes (Signed)
Dinner tray ordered, Pt. Requested breakfast, Pt. Stated, "I am hungry"

## 2013-01-05 NOTE — Telephone Encounter (Signed)
Received call from ED stating that patient had hypoglycemic episode for which she was seen in ED. Now improved.  ED attending asking for same day appointment for patient to be evaluated at clinic. Could not make appointment due to over booked status on computer system, but recommended that patient call the clinic at 8:30am and ask to be seen for same day clinic today. Will forward message to admin team.   Liam Graham, PGY-3 Family Medicine Resident

## 2013-01-05 NOTE — Progress Notes (Signed)
Patient's bp is above her parameter for Procrit. Patient states that she was in ED this morning for problems with her blood sugar. She has an appt at family practice this afternoon. She reports that she did not take her Apresoline x 3 days because she was out of it.  I called [facility] and spoke with Alinda Sierras. Rescheduled patient per Alinda Sierras. Told patient to go directly to [facility], to pick up her bp meds today and take them as ordered per Alinda Sierras. Patient verbalizes understanding.

## 2013-01-05 NOTE — Telephone Encounter (Signed)
LMOVM requesting pt to call back. Brodie Correll, Salome Spotted

## 2013-01-05 NOTE — ED Notes (Signed)
Pt. Ate 100% of breakfast , no s/s of hypoglycemia.  Pt. States, "I feel good."

## 2013-01-05 NOTE — ED Notes (Signed)
Pt states that she is not able to urinate at this time but states that she will be able to soon.

## 2013-01-05 NOTE — ED Notes (Signed)
Pt from home where she lives with her daughter.  Pt states that her glucometer has had dead batteries for a couple of weeks and she has not been able to check her CBG.  Pt states she gave herself 30 units of Lantus last night around 2100.  Pt's daughter attempted to wake her up this morning and pt was difficult to arouse.  EMS found pt to have CBG of 50.  Pt ate PB and J sandwich.  CBG up to 153.

## 2013-01-05 NOTE — ED Notes (Signed)
Spoke with her daughter, she will be here to pick up pt as soon as she can

## 2013-01-05 NOTE — Assessment & Plan Note (Addendum)
Insulin dependent diabetes pt. Her regimen is reported to be 30 units of Lantus at nighttime. Has been noncompliant with her CBGs at home. Last A1c was in January 7.8 Today A1c was 6.3. Recommended to decrease dose of Lantus to 15 units a day (half or current dosis) and instructions of how to titrate insulin pending a CBG's Followup primary care doctor in one week.

## 2013-01-05 NOTE — ED Notes (Signed)
Pt. Eating breakfast

## 2013-01-05 NOTE — Progress Notes (Signed)
Family Medicine Office Visit Note   Subjective:   Patient ID: Rachel Hobbs, female  DOB: [birth year], 72 y.o.. MRN: [id number]   Pt that comes today  for same-day appointment after ED visit this morning. She was seen for an episode of hypoglycemia. Pt is insulin-dependent diabetic. She uses Lantus 30-45 units at bedtime. She has been noncompliant with the sugars checks. Denies symptoms of hyperglycemia or other episodes of hypoglycemia.  At the time of the visit patient is completely asymptomatic. Denies fever, nausea, vomiting, or diarrhea.  She reported is eating less and doing exercise intermittently.  Review of Systems:  Per HPI  Objective:   Physical Exam: Gen:  Obese, NAD HEENT: Moist mucous membranes  CV: Regular rate and rhythm, no murmurs rubs or gallops PULM: Clear to auscultation bilaterally. No wheezes/rales ABD: Soft, non tender, non distended, normal bowel sounds EXT: No edema Neuro: Alert and oriented x3. No focalization  Assessment & Plan:

## 2013-01-07 NOTE — Telephone Encounter (Signed)
Please call and ask her to request refills of all BP meds from heart failure doctor as they have been adjusting her mediations.  Thanks,  [employer]

## 2013-01-08 ENCOUNTER — Encounter (HOSPITAL_COMMUNITY)
Admission: RE | Admit: 2013-01-08 | Discharge: 2013-01-08 | Disposition: A | Discharge status: Home / Self Care | Payer: Medicare Other | Source: Ambulatory Visit | Attending: Nephrology | Admitting: Nephrology

## 2013-01-08 LAB — GLUCOSE, CAPILLARY: Glucose-Capillary: 136 mg/dL — ABNORMAL HIGH (ref 70–99)

## 2013-01-08 MED ORDER — CLONIDINE HCL 0.1 MG PO TABS
0.1000 mg | ORAL_TABLET | Freq: Once | ORAL | Status: AC | PRN
  Administered 2013-01-08: 15:00:00 0.1 mg via ORAL

## 2013-01-08 MED ORDER — CLONIDINE HCL 0.1 MG PO TABS
ORAL_TABLET | ORAL | Status: AC
  Filled 2013-01-08: qty 1

## 2013-01-08 NOTE — Progress Notes (Signed)
Called and spoke with Rachel Hobbs at [facility] and reported elevated blood pressure readings today despite giving patient clonidine. (see doc flowsheets)  Pt states that she is out of her norvasc and is waiting on family practice to get back with the pharmacy to refill it and she has been out of her norvasc since last week.  Rachel Hobbs spoke with Dr Moshe Cipro and they are calling in her norvasc to her pharmacy which was verified with the patient.  They want her to take it tonight and reschedule with [place] next week.   Also if possible they would like her to monitor her blood pressures at home.  I relayed all of these instructions to the patient and she stated she would leave here and pick up her medicine, and she would take her blood pressures at the pharmacy for the next week.

## 2013-01-15 ENCOUNTER — Other Ambulatory Visit: Payer: Self-pay | Admitting: Family Medicine

## 2013-01-16 ENCOUNTER — Other Ambulatory Visit (HOSPITAL_COMMUNITY): Payer: Self-pay | Admitting: Internal Medicine

## 2013-01-19 ENCOUNTER — Encounter (HOSPITAL_COMMUNITY)
Admission: RE | Admit: 2013-01-19 | Discharge: 2013-01-19 | Disposition: A | Discharge status: Home / Self Care | Payer: Medicare Other | Source: Ambulatory Visit | Attending: Nephrology | Admitting: Nephrology

## 2013-01-19 DIAGNOSIS — N184 Chronic kidney disease, stage 4 (severe): Secondary | ICD-10-CM | POA: Insufficient documentation

## 2013-01-19 DIAGNOSIS — D638 Anemia in other chronic diseases classified elsewhere: Secondary | ICD-10-CM | POA: Insufficient documentation

## 2013-01-19 LAB — RENAL FUNCTION PANEL
Albumin: 3.8 g/dL (ref 3.5–5.2)
CO2: 24 mEq/L (ref 19–32)
Calcium: 9.9 mg/dL (ref 8.4–10.5)
Creatinine, Ser: 5.37 mg/dL — ABNORMAL HIGH (ref 0.50–1.10)
GFR calc non Af Amer: 7 mL/min — ABNORMAL LOW (ref >90)
Phosphorus: 6.2 mg/dL — ABNORMAL HIGH (ref 2.3–4.6)

## 2013-01-19 LAB — IRON AND TIBC
Iron: 82 ug/dL (ref 42–135)
Saturation Ratios: 34 % (ref 20–55)
UIBC: 156 ug/dL (ref 125–400)

## 2013-01-19 LAB — POCT HEMOGLOBIN-HEMACUE: Hemoglobin: 11.3 g/dL — ABNORMAL LOW (ref 12.0–15.0)

## 2013-01-19 MED ORDER — EPOETIN ALFA 20000 UNIT/ML IJ SOLN: 20000.0000 [IU] | INTRAMUSCULAR | Status: DC

## 2013-01-19 MED ORDER — EPOETIN ALFA 20000 UNIT/ML IJ SOLN
INTRAMUSCULAR | Status: AC
  Administered 2013-01-19: 15:00:00 20000 [IU] via SUBCUTANEOUS
  Filled 2013-01-19: qty 1

## 2013-01-22 ENCOUNTER — Other Ambulatory Visit: Payer: Self-pay | Admitting: Family Medicine

## 2013-01-22 LAB — PTH, INTACT AND CALCIUM
Calcium, Total (PTH): 9.8 mg/dL (ref 8.4–10.5)
PTH: 82 pg/mL — ABNORMAL HIGH (ref 14.0–72.0)

## 2013-01-22 NOTE — Telephone Encounter (Signed)
Refill request for Cholesterol med, Vit D and test strips .

## 2013-01-23 ENCOUNTER — Telehealth: Payer: Self-pay | Admitting: Family Medicine

## 2013-01-23 NOTE — Telephone Encounter (Signed)
Will forward to MD. Arwen Haseley,CMA  

## 2013-01-23 NOTE — Telephone Encounter (Signed)
Pt called and would like refills on her test strips and vitamin D sent to her pharmacy. JW

## 2013-01-23 NOTE — Telephone Encounter (Signed)
Will forward to MD. Jerrica Thorman,CMA  

## 2013-01-24 NOTE — Telephone Encounter (Signed)
Unable to LMOVM, as her VM is full. Will await callback from patient.  Please ask:  1.  What is the name of her meter that she needs strips for  2.  The Vit D she is requesting.  Is it calcitRIOL (ROCALTROL) 0.25 MCG capsule ?  [place], Salome Spotted

## 2013-01-24 NOTE — Telephone Encounter (Signed)
Attempted to call the patient today and yesterday to determine which strips (type of meter) she needs, but have been unable to contact her.

## 2013-01-24 NOTE — Telephone Encounter (Signed)
Review of her medications, do not show that she is taking Vit D. Is she requesting refill for her multivitamin? Please call to verify this and find out what DM meter strips she has/needs.   Thanks,  [employer]

## 2013-01-25 ENCOUNTER — Telehealth: Payer: Self-pay | Admitting: Family Medicine

## 2013-01-25 MED ORDER — CALCITRIOL 0.25 MCG PO CAPS: 0.2500 ug | ORAL_CAPSULE | Freq: Every day | ORAL | Status: DC

## 2013-01-25 MED ORDER — GLUCOSE BLOOD VI STRP: ORAL_STRIP | Status: DC

## 2013-01-25 NOTE — Telephone Encounter (Signed)
Spoke with pt and the Vitamin D on list is the correct one she wants called in.  [facility]

## 2013-01-25 NOTE — Telephone Encounter (Signed)
Pt called back and stated that the meter she uses is [employer] contour.JW

## 2013-01-30 ENCOUNTER — Telehealth: Payer: Self-pay | Admitting: Family Medicine

## 2013-01-30 NOTE — Telephone Encounter (Signed)
Will forward to MD. Jazmin Hartsell,CMA  

## 2013-01-30 NOTE — Telephone Encounter (Signed)
Refill request for Lantus.

## 2013-01-31 MED ORDER — INSULIN GLARGINE 100 UNIT/ML ~~LOC~~ SOLN: 15.0000 [IU] | Freq: Every day | SUBCUTANEOUS | Status: DC

## 2013-01-31 NOTE — Telephone Encounter (Signed)
Please call to schedule apt with PCP prior to additional refills.  Thanks,  jimmy

## 2013-01-31 NOTE — Telephone Encounter (Signed)
Yes Please. She has complicated medical problems and was just seen by [place] to prevent Hospitalization. She has called in 3 times in last month for medication refills, and I would like to see her before refilling/managing multiple medications via phone.  Thanks,  [employer]

## 2013-01-31 NOTE — Telephone Encounter (Signed)
Pt was last seen in clinic 01-05-13 with [facility].  Do you still want her to come in and see you?  Jazmin Hartsell,CMA

## 2013-02-01 NOTE — Telephone Encounter (Signed)
Left message for pt to call back and schedule an appt. Jazmin Hartsell,CMA

## 2013-02-02 ENCOUNTER — Encounter (HOSPITAL_COMMUNITY)
Admission: RE | Admit: 2013-02-02 | Discharge: 2013-02-02 | Disposition: A | Discharge status: Home / Self Care | Payer: Medicare Other | Source: Ambulatory Visit | Attending: Nephrology | Admitting: Nephrology

## 2013-02-02 LAB — POCT HEMOGLOBIN-HEMACUE: Hemoglobin: 11 g/dL — ABNORMAL LOW (ref 12.0–15.0)

## 2013-02-02 MED ORDER — EPOETIN ALFA 20000 UNIT/ML IJ SOLN
20000.0000 [IU] | INTRAMUSCULAR | Status: DC
  Administered 2013-02-02: 20000 [IU] via SUBCUTANEOUS

## 2013-02-02 MED ORDER — EPOETIN ALFA 20000 UNIT/ML IJ SOLN
INTRAMUSCULAR | Status: AC
  Administered 2013-02-02: 20000 [IU] via SUBCUTANEOUS
  Filled 2013-02-02: qty 1

## 2013-02-14 ENCOUNTER — Other Ambulatory Visit (HOSPITAL_COMMUNITY): Payer: Self-pay | Admitting: *Deleted

## 2013-02-15 ENCOUNTER — Encounter (HOSPITAL_COMMUNITY): Payer: Medicare Other

## 2013-02-16 ENCOUNTER — Encounter (HOSPITAL_COMMUNITY)
Admission: RE | Admit: 2013-02-16 | Discharge: 2013-02-16 | Disposition: A | Discharge status: Home / Self Care | Payer: Medicare Other | Source: Ambulatory Visit | Attending: Nephrology | Admitting: Nephrology

## 2013-02-16 LAB — POCT HEMOGLOBIN-HEMACUE: Hemoglobin: 11.4 g/dL — ABNORMAL LOW (ref 12.0–15.0)

## 2013-02-16 MED ORDER — EPOETIN ALFA 20000 UNIT/ML IJ SOLN
INTRAMUSCULAR | Status: AC
  Administered 2013-02-16: 20000 [IU]
  Filled 2013-02-16: qty 1

## 2013-02-19 ENCOUNTER — Encounter (HOSPITAL_COMMUNITY): Payer: Self-pay

## 2013-02-19 ENCOUNTER — Ambulatory Visit (HOSPITAL_COMMUNITY)
Admission: RE | Admit: 2013-02-19 | Discharge: 2013-02-19 | Disposition: A | Discharge status: Home / Self Care | Payer: Medicare Other | Source: Ambulatory Visit | Attending: Internal Medicine | Admitting: Internal Medicine

## 2013-02-19 VITALS — BP 164/66 | HR 76 | Wt 258.1 lb

## 2013-02-19 DIAGNOSIS — N186 End stage renal disease: Secondary | ICD-10-CM

## 2013-02-19 DIAGNOSIS — I5032 Chronic diastolic (congestive) heart failure: Secondary | ICD-10-CM

## 2013-02-19 MED ORDER — HYDRALAZINE HCL 50 MG PO TABS: 75.0000 mg | ORAL_TABLET | Freq: Three times a day (TID) | ORAL | Status: DC

## 2013-02-19 NOTE — Progress Notes (Signed)
Patient ID: Rachel Hobbs, female   DOB: 1941/02/09, 72 y.o.   MRN: [id number]  Nephrologist: Dr. Moshe Cipro PCP: Dr. Ferd Glassing ([facility])  HPI:  She is referred to HF clinic by Dr Beckie Salts ([place]) for HF management.   Rachel Hobbs is a 72 year old with a PMH of morbid obesity, chronic diastolic heart failure, DM, CKD stage IV (baseline 3.4-3.8), HTN, and abdominal hernia (CT abdomen 04/2011). She is not on Ace/Arb due to CKD.   ECHO 06/2011 EF [id number] Grade II diastolic heart failure. RV normal   Admitted to [facility] 04/21/2012 due to dyspnea. Pro BNP 720.8 Diuresed with IV lasix and transitioned to Lasix 160 mg in am and 80 mg at night. Discharged weight 284 pounds. D/C Creatinine 4.03   On July 8 was seen in the ER with increasing SOB and swelling. BNP slightly up from baseline (720-> 833). Was given 80mg  IV lasix and had about 500cc out. Felt some better and discharged home. Cr up to 4.1. BUN 64. No uremic sx.  Recently in the hospital due to low blood sugars.   She feels pretty good. If she walks too fast gets SOB and has to stop. Weighing most days. Weight stable at 257-260.  She has not taken extra fluid pills.  She has chronic orthopnea.  No PND.  No edema.  Saw Dr. Moshe Cipro recently and Cr 5.37.  They are talking about placing access.   ROS: All systems negative except as listed in HPI, PMH and Problem List.  Past Medical History  Diagnosis Date  . Depression   . Hyperlipidemia   . Hypertension   . Diabetes mellitus   . Hernia     Current Outpatient Prescriptions  Medication Sig Dispense Refill  . acetaminophen (TYLENOL) 500 MG tablet Take 1,000 mg by mouth every 6 (six) hours as needed for pain.      Marland Kitchen amLODipine (NORVASC) 10 MG tablet Take 1 tablet (10 mg total) by mouth daily.  90 tablet  3  . aspirin 81 MG tablet Take 81 mg by mouth daily.        Marland Kitchen atorvastatin (LIPITOR) 40 MG tablet TAKE 1 TABLET BY MOUTH EVERY DAY FOR CHOLESTEROL  90 tablet  1  . calcitRIOL (ROCALTROL) 0.25  MCG capsule Take 1 capsule (0.25 mcg total) by mouth daily.  30 capsule  3  . carvedilol (COREG) 12.5 MG tablet Take 12.5 mg by mouth 2 (two) times daily with a meal.      . cetirizine (ZYRTEC) 10 MG tablet Take 1 tablet (10 mg total) by mouth daily.  30 tablet  0  . docusate sodium (COLACE) 100 MG capsule Take 100 mg by mouth at bedtime.       . furosemide (LASIX) 80 MG tablet TAKE 2 TABLETS BY MOUTH EVERY DAY IN THE MORNING AND ONE EVERY DAY IN AFTERNOON  270 tablet  1  . glipiZIDE (GLUCOTROL) 10 MG tablet Take 1 tablet (10 mg total) by mouth 2 (two) times daily before a meal.  180 tablet  3  . glucose blood (BAYER CONTOUR TEST) test strip Use as instructed  100 each  3  . hydrALAZINE (APRESOLINE) 50 MG tablet Take 50 mg by mouth 3 (three) times daily.      . insulin glargine (LANTUS) 100 UNIT/ML injection Inject 0.15 mLs (15 Units total) into the skin at bedtime.  10 mL  1  . isosorbide mononitrate (IMDUR) 30 MG 24 hr tablet Take 30 mg by mouth daily.      Marland Kitchen  metolazone (ZAROXOLYN) 2.5 MG tablet Take 1 tablet (2.5 mg total) by mouth once a week. And as needed  10 tablet  3  . Multiple Vitamins-Minerals (ONE DAILY WOMENS) TABS Take by mouth daily.         No current facility-administered medications for this encounter.     PHYSICAL EXAM: Filed Vitals:   02/19/13 1433  BP: 164/66  Pulse: 76  Weight: 258 lb 1.9 oz (117.082 kg)  SpO2: 99%    General:  Looks young her than stated age. Sitting in [place]. No resp difficulty HEENT: normal Neck: supple. Thick  JVP appears up to 8 but difficult to assess due to body habitus. Carotids 2+ bilaterally; no bruits. No lymphadenopathy or thryomegaly appreciated. Cor: PMI nonpalpabe Regular rate & rhythm. No rubs, gallops or murmurs. Lungs: clear Abdomen: soft, nontender, nondistended. No hepatosplenomegaly. No bruits or masses. Good bowel sounds. Extremities: no cyanosis, clubbing, rash, Chronic venous stasis changes in ankles.  Neuro: alert &  orientedx3, cranial nerves grossly intact. Moves all 4 extremities w/o difficulty. Affect pleasant.    ASSESSMENT & PLAN: 1. Chronic diastolic HF 2. Acute on chronic renal failure, stage V 3. Morbid obesity 4. HTN, uncontrolled  Overall volume status looks ok but renal function appears to be getting worse. I suspect she will need HD soon. We discussed this.  Reinforced need for daily weights and reviewed use of sliding scale diuretics. BR remains elevated. Will increase hydralazine 75 tid.   RTC in 3-4 months.   Rachel Chowning,MD 10:15 PM

## 2013-02-19 NOTE — Patient Instructions (Signed)
Increase Hydralazine to 75 mg (1 & 1/2 tabs) Three times a day   We will contact you in 6 months to schedule your next appointment.

## 2013-03-02 ENCOUNTER — Encounter (HOSPITAL_COMMUNITY)
Admission: RE | Admit: 2013-03-02 | Discharge: 2013-03-02 | Disposition: A | Discharge status: Home / Self Care | Payer: Medicare Other | Source: Ambulatory Visit | Attending: Nephrology | Admitting: Nephrology

## 2013-03-02 DIAGNOSIS — N184 Chronic kidney disease, stage 4 (severe): Secondary | ICD-10-CM | POA: Insufficient documentation

## 2013-03-02 DIAGNOSIS — D638 Anemia in other chronic diseases classified elsewhere: Secondary | ICD-10-CM | POA: Insufficient documentation

## 2013-03-02 LAB — IRON AND TIBC
Iron: 57 ug/dL (ref 42–135)
Saturation Ratios: 25 % (ref 20–55)
TIBC: 231 ug/dL — ABNORMAL LOW (ref 250–470)
UIBC: 174 ug/dL (ref 125–400)

## 2013-03-02 LAB — RENAL FUNCTION PANEL
Albumin: 3.7 g/dL (ref 3.5–5.2)
CO2: 26 mEq/L (ref 19–32)
Calcium: 9 mg/dL (ref 8.4–10.5)
Creatinine, Ser: 4.93 mg/dL — ABNORMAL HIGH (ref 0.50–1.10)
Glucose, Bld: 162 mg/dL — ABNORMAL HIGH (ref 70–99)
Phosphorus: 5.5 mg/dL — ABNORMAL HIGH (ref 2.3–4.6)
Potassium: 4 mEq/L (ref 3.5–5.1)

## 2013-03-02 LAB — POCT HEMOGLOBIN-HEMACUE: Hemoglobin: 11.7 g/dL — ABNORMAL LOW (ref 12.0–15.0)

## 2013-03-02 MED ORDER — EPOETIN ALFA 20000 UNIT/ML IJ SOLN
20000.0000 [IU] | INTRAMUSCULAR | Status: DC
  Administered 2013-03-02: 20000 [IU] via SUBCUTANEOUS

## 2013-03-02 MED ORDER — EPOETIN ALFA 20000 UNIT/ML IJ SOLN
INTRAMUSCULAR | Status: AC
  Administered 2013-03-02: 20000 [IU] via SUBCUTANEOUS
  Filled 2013-03-02: qty 1

## 2013-03-13 ENCOUNTER — Telehealth: Payer: Self-pay

## 2013-03-13 NOTE — Telephone Encounter (Signed)
Patient is seen by Cardiologist for heart failure and needs to call them for heart medications.  Thanks,  [employer]

## 2013-03-13 NOTE — Telephone Encounter (Signed)
Refill request for carvedilol.

## 2013-03-14 NOTE — Telephone Encounter (Signed)
LMOVM for pt to return call.  Please have her call Dr. Carter Kitten for carvedilol refill. Fleeger, Salome Spotted

## 2013-03-15 NOTE — Addendum Note (Signed)
Encounter addended by: Jolaine Artist, MD on: 03/15/2013 10:17 PM<BR>     Documentation filed: Follow-up Section, LOS Section, Visit Diagnoses, Notes Section

## 2013-03-19 ENCOUNTER — Encounter (HOSPITAL_COMMUNITY): Payer: Medicare Other

## 2013-03-23 ENCOUNTER — Ambulatory Visit (INDEPENDENT_AMBULATORY_CARE_PROVIDER_SITE_OTHER): Payer: Medicare Other | Admitting: Family Medicine

## 2013-03-23 ENCOUNTER — Encounter: Payer: Self-pay | Admitting: Family Medicine

## 2013-03-23 VITALS — BP 151/75 | HR 67 | Temp 98.4°F

## 2013-03-23 DIAGNOSIS — M109 Gout, unspecified: Secondary | ICD-10-CM

## 2013-03-23 MED ORDER — PREDNISONE 20 MG PO TABS: 40.0000 mg | ORAL_TABLET | Freq: Every day | ORAL | Status: DC

## 2013-03-23 MED ORDER — TRAMADOL HCL 50 MG PO TABS: 50.0000 mg | ORAL_TABLET | Freq: Two times a day (BID) | ORAL | Status: DC | PRN

## 2013-03-23 NOTE — Patient Instructions (Signed)
Your pain in your toe is likely gout. Take the following medications for this:  Prednisone 40mg  (2 tablets of 20mg ) a day for 4 days Tramadol 50mg  every 12 hours until the pain is gone Tylenol regular strength 650mg  for breakthrough pain in between the tramadol doses.  Come back in 2 weeks or sooner if symptoms do not get better. If you experience fevers, chills, or start to feel sick please call the clinic.

## 2013-03-23 NOTE — Progress Notes (Signed)
   Subjective:    Patient ID: Rachel Hobbs, female    DOB: [birth year], 72 y.o.   MRN: [id number]  HPI  # Left big toe pain - started yesterday, has been there constantly since it started all of a sudden.  - feels sore just on the side of her first joint of the great toe.  - feels similar to episode last year that was diagnosed as gout, no prior episodes to this. - unable to ambulate, any touch to the area is painful - no alcohol use. Recently had several meals with red meat.  Review of Systems No CP, no SOB. No fevers/chills, diarrhea/constipation, dysuria    Objective:   Physical Exam BP 151/75  Pulse 67  Temp(Src) 98.4 F (36.9 C) (Oral)  General: sitting in wheelchair, immediately says to avoid hitting her foot CV: RRR, normal heart sounds Ext: 2+ edema bilaterally, chronic venous stasis changes over shins bilaterally. Both feet are warm Left great toe/MCP joint: same temperature as rest of foot, no erythema. No sign of skin breakdown. Very tender to palpation over medial MCP joint, only mild swelling without any area of fluctuance.      Assessment & Plan:  See Problem List documentation.

## 2013-03-23 NOTE — Assessment & Plan Note (Addendum)
Similar episode as last year. Last serum uric acid in 2010 elevated to 10. Does not appear to be an infected joint, no signs of fever or systemic symptoms. Given ESRD (not on dialysis), avoid NSAIDs and caution with colchicine. Will treat with 4 day course of 40mg  prednisone, 50mg  tramadol, tylenol. If symptoms persist would try 0.6mg  colchicine once. Return precautions for fevers, worsening pain. F/u 2 weeks, consider testing serum uric acid and discussion with cardiologist about high dose of lasix possibly exacerbating this.

## 2013-03-26 ENCOUNTER — Telehealth: Payer: Self-pay | Admitting: Family Medicine

## 2013-03-26 NOTE — Telephone Encounter (Signed)
Patient states her CBGs have been elevated.  Yesterday was 500 and had Lantus 35 units last night.  CBG this morning--175.  Has one more dose of prednisone to take and will be complete.  Informed patient to continue with last dose and to call office if CBGs continue to be elevated after finishing med.  Patient agreeable and verbalized understanding.  Nolene Ebbs, RN

## 2013-03-26 NOTE — Telephone Encounter (Signed)
Pt called because she is taking prednisone and her BP is being out of control and doesn't know why and also if she should still take the prednisone.She would like someone to call her concerning this. jw

## 2013-03-27 NOTE — Telephone Encounter (Signed)
Pt called back and states that she is in the donut hole with medicare and her insurance is not covering her lantus.  Would like to know if she can take anything else for her sugar or if we have samples to give her. Please advise. Constanza Mincy,CMA

## 2013-03-29 NOTE — Telephone Encounter (Signed)
Left message for pt to call back.  Please give message when she does. Edison Wollschlager,CMA

## 2013-03-29 NOTE — Telephone Encounter (Signed)
Please call and inform her that due to her blood sugars being under good control and the face that she is stopping the prednisone she can stop taking her Lantus for the next week. For the next week she should check her blood sugar every morning before breakfast, 2 hours after her biggest meal, and at bedtime. She should record those blood sugars and call the clinic once she has 10 days of readings. We will then decided if she need insulin at that time.  Thanks,  [employer]

## 2013-03-30 ENCOUNTER — Other Ambulatory Visit (HOSPITAL_COMMUNITY): Payer: Self-pay | Admitting: *Deleted

## 2013-03-30 NOTE — Telephone Encounter (Signed)
Patient informed. 

## 2013-04-02 ENCOUNTER — Encounter (HOSPITAL_COMMUNITY)
Admission: RE | Admit: 2013-04-02 | Discharge: 2013-04-02 | Disposition: A | Discharge status: Home / Self Care | Payer: Medicare Other | Source: Ambulatory Visit | Attending: Nephrology | Admitting: Nephrology

## 2013-04-02 DIAGNOSIS — N184 Chronic kidney disease, stage 4 (severe): Secondary | ICD-10-CM | POA: Insufficient documentation

## 2013-04-02 DIAGNOSIS — D638 Anemia in other chronic diseases classified elsewhere: Secondary | ICD-10-CM | POA: Insufficient documentation

## 2013-04-02 LAB — POCT HEMOGLOBIN-HEMACUE: Hemoglobin: 10.6 g/dL — ABNORMAL LOW (ref 12.0–15.0)

## 2013-04-02 LAB — IRON AND TIBC
Iron: 72 ug/dL (ref 42–135)
Saturation Ratios: 32 % (ref 20–55)
TIBC: 225 ug/dL — ABNORMAL LOW (ref 250–470)

## 2013-04-02 LAB — RENAL FUNCTION PANEL
BUN: 102 mg/dL — ABNORMAL HIGH (ref 6–23)
CO2: 23 mEq/L (ref 19–32)
Chloride: 98 mEq/L (ref 96–112)
Creatinine, Ser: 5.26 mg/dL — ABNORMAL HIGH (ref 0.50–1.10)
GFR calc non Af Amer: 7 mL/min — ABNORMAL LOW (ref >90)
Potassium: 4 mEq/L (ref 3.5–5.1)

## 2013-04-02 LAB — FERRITIN: Ferritin: 289 ng/mL (ref 10–291)

## 2013-04-02 MED ORDER — EPOETIN ALFA 20000 UNIT/ML IJ SOLN
20000.0000 [IU] | INTRAMUSCULAR | Status: DC
  Administered 2013-04-02: 20000 [IU] via SUBCUTANEOUS

## 2013-04-02 MED ORDER — EPOETIN ALFA 20000 UNIT/ML IJ SOLN
INTRAMUSCULAR | Status: AC
  Filled 2013-04-02: qty 1

## 2013-04-03 LAB — PTH, INTACT AND CALCIUM: PTH: 289.7 pg/mL — ABNORMAL HIGH (ref 14.0–72.0)

## 2013-04-04 ENCOUNTER — Telehealth: Payer: Self-pay | Admitting: *Deleted

## 2013-04-04 NOTE — Telephone Encounter (Signed)
Patient calling concerned about blood sugar reading since being off Lantus. Last 4 morning readings have been 150, 167, 201, 193. Patient concerned and would like to restart Lantus but unable to afford right now since she is in the donut hole and wont be able to afford until January. Informed patient that I would notify PCP and if he decides to restart we could possibly provide some samples for her.  After speaking with PCP he is ok with patient getting samples of Lantus and restarting at her previous dose. Patient informed, she will be by to pick up samples later today.

## 2013-04-30 ENCOUNTER — Encounter (HOSPITAL_COMMUNITY)
Admission: RE | Admit: 2013-04-30 | Discharge: 2013-04-30 | Disposition: A | Discharge status: Home / Self Care | Payer: Medicare Other | Source: Ambulatory Visit | Attending: Nephrology | Admitting: Nephrology

## 2013-04-30 DIAGNOSIS — D638 Anemia in other chronic diseases classified elsewhere: Secondary | ICD-10-CM | POA: Insufficient documentation

## 2013-04-30 DIAGNOSIS — N184 Chronic kidney disease, stage 4 (severe): Secondary | ICD-10-CM | POA: Insufficient documentation

## 2013-04-30 LAB — IRON AND TIBC
Iron: 66 ug/dL (ref 42–135)
Saturation Ratios: 28 % (ref 20–55)
TIBC: 234 ug/dL — ABNORMAL LOW (ref 250–470)
UIBC: 168 ug/dL (ref 125–400)

## 2013-04-30 LAB — RENAL FUNCTION PANEL
ALBUMIN: 3.5 g/dL (ref 3.5–5.2)
BUN: 73 mg/dL — ABNORMAL HIGH (ref 6–23)
CO2: 26 mEq/L (ref 19–32)
CREATININE: 4.84 mg/dL — AB (ref 0.50–1.10)
Calcium: 9.1 mg/dL (ref 8.4–10.5)
Chloride: 94 mEq/L — ABNORMAL LOW (ref 96–112)
GFR calc Af Amer: 9 mL/min — ABNORMAL LOW (ref >90)
GFR calc non Af Amer: 8 mL/min — ABNORMAL LOW (ref >90)
Glucose, Bld: 255 mg/dL — ABNORMAL HIGH (ref 70–99)
Phosphorus: 5.5 mg/dL — ABNORMAL HIGH (ref 2.3–4.6)
Potassium: 4.1 mEq/L (ref 3.7–5.3)
Sodium: 138 mEq/L (ref 137–147)

## 2013-04-30 LAB — FERRITIN: Ferritin: 310 ng/mL — ABNORMAL HIGH (ref 10–291)

## 2013-04-30 LAB — POCT HEMOGLOBIN-HEMACUE: HEMOGLOBIN: 10.5 g/dL — AB (ref 12.0–15.0)

## 2013-04-30 MED ORDER — EPOETIN ALFA 20000 UNIT/ML IJ SOLN
20000.0000 [IU] | INTRAMUSCULAR | Status: DC
  Administered 2013-04-30: 20000 [IU] via SUBCUTANEOUS

## 2013-04-30 MED ORDER — EPOETIN ALFA 20000 UNIT/ML IJ SOLN
INTRAMUSCULAR | Status: DC
Start: 2013-04-30 — End: 2013-05-01
  Filled 2013-04-30: qty 1

## 2013-05-02 ENCOUNTER — Telehealth: Payer: Self-pay | Admitting: Family Medicine

## 2013-05-02 MED ORDER — INSULIN GLARGINE 100 UNIT/ML ~~LOC~~ SOLN: 15.0000 [IU] | Freq: Every day | SUBCUTANEOUS | Status: DC

## 2013-05-02 NOTE — Telephone Encounter (Signed)
Pt is out of insulin. She tried to get more from the drugstore but she has to pay a deductible before she can get it. She cannot afford it right now

## 2013-05-02 NOTE — Telephone Encounter (Signed)
Spoke with patient and her insulin is going to cost her $170 this month and she cant afford that.   Asked pt to come by tomorrow and I would give her samples.   Placed two solostar pens in refrigerator to pick up. Fleeger, Salome Spotted

## 2013-05-09 ENCOUNTER — Other Ambulatory Visit: Payer: Self-pay | Admitting: Family Medicine

## 2013-05-09 ENCOUNTER — Telehealth: Payer: Self-pay | Admitting: Family Medicine

## 2013-05-09 MED ORDER — GLUCOSE BLOOD VI STRP: ORAL_STRIP | Status: DC

## 2013-05-09 MED ORDER — BLOOD GLUCOSE METER KIT: PACK | Status: DC

## 2013-05-09 MED ORDER — ONETOUCH ULTRASOFT LANCETS MISC: Status: DC

## 2013-05-09 NOTE — Telephone Encounter (Signed)
Patient states her insurance company told her that her glucometer is "out of date" and that she will need a new RX for a new one. Please send to [facility] on [address]

## 2013-05-28 ENCOUNTER — Encounter (HOSPITAL_COMMUNITY)
Admission: RE | Admit: 2013-05-28 | Discharge: 2013-05-28 | Disposition: A | Discharge status: Home / Self Care | Payer: Medicare Other | Source: Ambulatory Visit | Attending: Nephrology | Admitting: Nephrology

## 2013-05-28 DIAGNOSIS — D638 Anemia in other chronic diseases classified elsewhere: Secondary | ICD-10-CM | POA: Insufficient documentation

## 2013-05-28 DIAGNOSIS — N184 Chronic kidney disease, stage 4 (severe): Secondary | ICD-10-CM | POA: Insufficient documentation

## 2013-05-28 LAB — RENAL FUNCTION PANEL
ALBUMIN: 3.4 g/dL — AB (ref 3.5–5.2)
BUN: 76 mg/dL — ABNORMAL HIGH (ref 6–23)
CO2: 23 mEq/L (ref 19–32)
Calcium: 8.7 mg/dL (ref 8.4–10.5)
Chloride: 99 mEq/L (ref 96–112)
Creatinine, Ser: 5.06 mg/dL — ABNORMAL HIGH (ref 0.50–1.10)
GFR calc Af Amer: 9 mL/min — ABNORMAL LOW (ref >90)
GFR calc non Af Amer: 8 mL/min — ABNORMAL LOW (ref >90)
GLUCOSE: 241 mg/dL — AB (ref 70–99)
POTASSIUM: 4.4 meq/L (ref 3.7–5.3)
Phosphorus: 4.9 mg/dL — ABNORMAL HIGH (ref 2.3–4.6)
Sodium: 140 mEq/L (ref 137–147)

## 2013-05-28 LAB — IRON AND TIBC
IRON: 81 ug/dL (ref 42–135)
Saturation Ratios: 39 % (ref 20–55)
TIBC: 210 ug/dL — AB (ref 250–470)
UIBC: 129 ug/dL (ref 125–400)

## 2013-05-28 LAB — FERRITIN: Ferritin: 324 ng/mL — ABNORMAL HIGH (ref 10–291)

## 2013-05-28 LAB — POCT HEMOGLOBIN-HEMACUE: Hemoglobin: 10.2 g/dL — ABNORMAL LOW (ref 12.0–15.0)

## 2013-05-28 MED ORDER — EPOETIN ALFA 20000 UNIT/ML IJ SOLN
20000.0000 [IU] | INTRAMUSCULAR | Status: DC
  Administered 2013-05-28: 20000 [IU] via SUBCUTANEOUS

## 2013-05-28 MED ORDER — EPOETIN ALFA 20000 UNIT/ML IJ SOLN
INTRAMUSCULAR | Status: AC
  Filled 2013-05-28: qty 1

## 2013-05-29 LAB — PTH, INTACT AND CALCIUM
Calcium, Total (PTH): 8.4 mg/dL (ref 8.4–10.5)
PTH: 268.4 pg/mL — ABNORMAL HIGH (ref 14.0–72.0)

## 2013-06-12 ENCOUNTER — Other Ambulatory Visit: Payer: Self-pay | Admitting: Family Medicine

## 2013-06-22 ENCOUNTER — Other Ambulatory Visit (HOSPITAL_COMMUNITY): Payer: Self-pay | Admitting: *Deleted

## 2013-06-25 ENCOUNTER — Encounter (HOSPITAL_COMMUNITY)
Admission: RE | Admit: 2013-06-25 | Discharge: 2013-06-25 | Disposition: A | Discharge status: Home / Self Care | Payer: Medicare Other | Source: Ambulatory Visit | Attending: Nephrology | Admitting: Nephrology

## 2013-06-25 DIAGNOSIS — D638 Anemia in other chronic diseases classified elsewhere: Secondary | ICD-10-CM | POA: Insufficient documentation

## 2013-06-25 DIAGNOSIS — N184 Chronic kidney disease, stage 4 (severe): Secondary | ICD-10-CM | POA: Insufficient documentation

## 2013-06-25 LAB — IRON AND TIBC
IRON: 66 ug/dL (ref 42–135)
SATURATION RATIOS: 30 % (ref 20–55)
TIBC: 218 ug/dL — ABNORMAL LOW (ref 250–470)
UIBC: 152 ug/dL (ref 125–400)

## 2013-06-25 LAB — RENAL FUNCTION PANEL
Albumin: 3.4 g/dL — ABNORMAL LOW (ref 3.5–5.2)
BUN: 78 mg/dL — AB (ref 6–23)
CALCIUM: 8.9 mg/dL (ref 8.4–10.5)
CO2: 25 meq/L (ref 19–32)
Chloride: 97 mEq/L (ref 96–112)
Creatinine, Ser: 4.96 mg/dL — ABNORMAL HIGH (ref 0.50–1.10)
GFR calc non Af Amer: 8 mL/min — ABNORMAL LOW (ref >90)
GFR, EST AFRICAN AMERICAN: 9 mL/min — AB (ref >90)
Glucose, Bld: 232 mg/dL — ABNORMAL HIGH (ref 70–99)
Phosphorus: 5.3 mg/dL — ABNORMAL HIGH (ref 2.3–4.6)
Potassium: 4.2 mEq/L (ref 3.7–5.3)
SODIUM: 140 meq/L (ref 137–147)

## 2013-06-25 LAB — FERRITIN: Ferritin: 239 ng/mL (ref 10–291)

## 2013-06-25 LAB — POCT HEMOGLOBIN-HEMACUE: Hemoglobin: 9.8 g/dL — ABNORMAL LOW (ref 12.0–15.0)

## 2013-06-25 MED ORDER — EPOETIN ALFA 20000 UNIT/ML IJ SOLN: 20000.0000 [IU] | INTRAMUSCULAR | Status: DC

## 2013-06-25 MED ORDER — EPOETIN ALFA 20000 UNIT/ML IJ SOLN
INTRAMUSCULAR | Status: AC
  Administered 2013-06-25: 20000 [IU] via SUBCUTANEOUS
  Filled 2013-06-25: qty 1

## 2013-06-26 LAB — PTH, INTACT AND CALCIUM
Calcium, Total (PTH): 8.5 mg/dL (ref 8.4–10.5)
PTH: 263.9 pg/mL — ABNORMAL HIGH (ref 14.0–72.0)

## 2013-06-27 ENCOUNTER — Encounter: Payer: Self-pay | Admitting: Home Health Services

## 2013-06-27 ENCOUNTER — Ambulatory Visit (INDEPENDENT_AMBULATORY_CARE_PROVIDER_SITE_OTHER): Payer: Medicare Other | Admitting: Home Health Services

## 2013-06-27 VITALS — BP 135/63 | HR 69 | Temp 97.4°F | Ht 62.0 in | Wt 251.5 lb

## 2013-06-27 DIAGNOSIS — E119 Type 2 diabetes mellitus without complications: Secondary | ICD-10-CM

## 2013-06-27 DIAGNOSIS — E785 Hyperlipidemia, unspecified: Secondary | ICD-10-CM

## 2013-06-27 DIAGNOSIS — Z0000 Encounter for general adult medical examination without abnormal findings: Secondary | ICD-10-CM

## 2013-06-27 LAB — POCT GLYCOSYLATED HEMOGLOBIN (HGB A1C): HEMOGLOBIN A1C: 7.6

## 2013-06-27 LAB — LIPID PANEL
CHOL/HDL RATIO: 2.8 ratio
Cholesterol: 119 mg/dL (ref 0–200)
HDL: 42 mg/dL (ref >39)
LDL CALC: 57 mg/dL (ref 0–99)
Triglycerides: 100 mg/dL (ref <150)
VLDL: 20 mg/dL (ref 0–40)

## 2013-06-27 NOTE — Progress Notes (Signed)
Patient here for annual wellness visit, patient reports: Risk Factors/Conditions needing evaluation or treatment: Pt does not have any new risk factors that need evaluation. Home Safety: Pt lives with daughter in 1 story home.  Pt reports having smoke detectors and does not have adaptive equipment. Other Information: Corrective lens: Pt wears corrective lens for reading.  Pt has annual eye exams. Dentures: Pt has full dentures. Memory: Pt denies memory problems.  Patient's Mini Mental Score (recorded in doc. flowsheet): 28 BMI/Exericse:  We discussed current BMI and strategies for weight loss including portion sizes and carb monitoring.  We discussed adding in regular exercise as well. Med Review: We discussed importance of taking medications as prescribed for DM, HTN, and cholesterol.  Pt reports missing medications occasionally. ADL/IADL:  Pt reports independence for all functions. Bladder control:  Pt reports no new problems with bladder control. Colonoscopy:  Pt declined.  Gave patient hemocult card with instructions. Foot exam: done and documented in short form    Balance/Gait: Pt reported no fall in the past year.  We discussed strategies for home safety and fall prevention.  Balance Abnormal Patient value  Sitting balance    Sit to stand    Attempts to arise    Immediate standing balance    Standing balance    Nudge    Eyes closed- Romberg    Tandem stance x unable  Back lean x unable  Neck Rotation    360 degree turn    Sitting down     Gait Abnormal Patient value  Initiation of gait    Step length-left    Step length-right    Step height-left    Step height-right    Step symmetry    Step continuity    Path deviation    Trunk movement x   Walking stance x       Annual Wellness Visit Requirements Recorded Today In  Medical, family, social history Past Medical, Family, Social History Section  Current providers Care team  Current medications Medications  Wt,  BP, Ht, BMI Vital signs  Tobacco, alcohol, illicit drug use History  ADL Nurse Assessment  Depression Screening Nurse Assessment  Cognitive impairment Nurse Assessment  Mini Mental Status Document Flowsheet  Fall Risk Fall/Depression  Home Safety Progress Note  End of Life Planning (welcome visit) Social Documentation  Medicare preventative services Progress Note  Risk factors/conditions needing evaluation/treatment Progress Note  Personalized health advice Patient Instructions, goals, letter  Diet & Exercise Social Documentation  Emergency Contact Social Documentation  Seat Belts Social Documentation  Sun exposure/protection Social Documentation

## 2013-07-13 ENCOUNTER — Ambulatory Visit (INDEPENDENT_AMBULATORY_CARE_PROVIDER_SITE_OTHER): Payer: Medicare Other

## 2013-07-13 VITALS — BP 105/62 | HR 83 | Resp 16

## 2013-07-13 DIAGNOSIS — E1142 Type 2 diabetes mellitus with diabetic polyneuropathy: Secondary | ICD-10-CM

## 2013-07-13 DIAGNOSIS — B351 Tinea unguium: Secondary | ICD-10-CM

## 2013-07-13 DIAGNOSIS — M79609 Pain in unspecified limb: Secondary | ICD-10-CM

## 2013-07-13 DIAGNOSIS — E1140 Type 2 diabetes mellitus with diabetic neuropathy, unspecified: Secondary | ICD-10-CM

## 2013-07-13 DIAGNOSIS — E1149 Type 2 diabetes mellitus with other diabetic neurological complication: Secondary | ICD-10-CM

## 2013-07-13 NOTE — Patient Instructions (Signed)
Diabetes and Foot Care Diabetes may cause you to have problems because of poor blood supply (circulation) to your feet and legs. This may cause the skin on your feet to become thinner, break easier, and heal more slowly. Your skin may become dry, and the skin may peel and crack. You may also have nerve damage in your legs and feet causing decreased feeling in them. You may not notice minor injuries to your feet that could lead to infections or more serious problems. Taking care of your feet is one of the most important things you can do for yourself.  HOME CARE INSTRUCTIONS  Wear shoes at all times, even in the house. Do not go barefoot. Bare feet are easily injured.  Check your feet daily for blisters, cuts, and redness. If you cannot see the bottom of your feet, use a mirror or ask someone for help.  Wash your feet with warm water (do not use hot water) and mild soap. Then pat your feet and the areas between your toes until they are completely dry. Do not soak your feet as this can dry your skin.  Apply a moisturizing lotion or petroleum jelly (that does not contain alcohol and is unscented) to the skin on your feet and to dry, brittle toenails. Do not apply lotion between your toes.  Trim your toenails straight across. Do not dig under them or around the cuticle. File the edges of your nails with an emery board or nail file.  Do not cut corns or calluses or try to remove them with medicine.  Wear clean socks or stockings every day. Make sure they are not too tight. Do not wear knee-high stockings since they may decrease blood flow to your legs.  Wear shoes that fit properly and have enough cushioning. To break in new shoes, wear them for just a few hours a day. This prevents you from injuring your feet. Always look in your shoes before you put them on to be sure there are no objects inside.  Do not cross your legs. This may decrease the blood flow to your feet.  If you find a minor scrape,  cut, or break in the skin on your feet, keep it and the skin around it clean and dry. These areas may be cleansed with mild soap and water. Do not cleanse the area with peroxide, alcohol, or iodine.  When you remove an adhesive bandage, be sure not to damage the skin around it.  If you have a wound, look at it several times a day to make sure it is healing.  Do not use heating pads or hot water bottles. They may burn your skin. If you have lost feeling in your feet or legs, you may not know it is happening until it is too late.  Make sure your health care provider performs a complete foot exam at least annually or more often if you have foot problems. Report any cuts, sores, or bruises to your health care provider immediately. SEEK MEDICAL CARE IF:   You have an injury that is not healing.  You have cuts or breaks in the skin.  You have an ingrown nail.  You notice redness on your legs or feet.  You feel burning or tingling in your legs or feet.  You have pain or cramps in your legs and feet.  Your legs or feet are numb.  Your feet always feel cold. SEEK IMMEDIATE MEDICAL CARE IF:   There is increasing redness,   swelling, or pain in or around a wound.  There is a red line that goes up your leg.  Pus is coming from a wound.  You develop a fever or as directed by your health care provider.  You notice a bad smell coming from an ulcer or wound. Document Released: 04/02/2000 Document Revised: 12/06/2012 Document Reviewed: 09/12/2012 [employer] Patient Information 2014 [employer].  

## 2013-07-13 NOTE — Progress Notes (Signed)
   Subjective:    Patient ID: Rachel Hobbs, female    DOB: July 06, 1940, 73 y.o.   MRN: [id number]  HPI Comments: "Cut my toenails"  Patient states she needs her toenails trimmed. The toenails are long, thick and discolored. She is unable to get down to trim them herself. She states that sometimes her toes in her left foot are numb.     Review of Systems  Constitutional: Positive for chills.  HENT: Positive for sinus pressure.   Eyes: Positive for visual disturbance.  Cardiovascular: Positive for leg swelling.  All other systems reviewed and are negative.       Objective:   Physical Exam Vascular status is intact as follows DP +2/4 bilateral PT one over 4 bilateral thready at best +1 edema noted bilateral neurologically epicritic and proprioceptive sensations intact although diminished on Semmes Weinstein testing to the digits and plantar forefoot arch. Patient also has some paresthesias at times. Dermatologically skin color pigment normal hair growth absent nails thick brittle criptotic incurvated and friable patient been doing her own self care if can no longer get nails there to thick and hard for her to come around the painful and symptomatic. There is normal plantar response DTRs not listed this time. Orthopedic exam reveals rectus foot type flexible digital contractures and mild HAV deformity right more so than left with some tenderness along the first MTP area wearing accommodative type shoes. Patient Myriam Jacobson last A1c was 7 previously was in the 6 range however has gone up little bit in her most recent testing otherwise indicates her health is stable there no open wounds ulcerations no secondary infection is noted on either foot does have some overall dry skin noted.       Assessment & Plan:  Assessment this time his diabetes with peripheral neuropathy and complications loss of sensation being noted this time thick brittle crumbly deformed criptotic nails 1 through 5 bilateral are  debrided and the presence of diabetes and complications return for future palliative care and as-needed basis suggest 3 month followup for nail care next  Harriet Masson DPM

## 2013-07-23 ENCOUNTER — Encounter (HOSPITAL_COMMUNITY)
Admission: RE | Admit: 2013-07-23 | Discharge: 2013-07-23 | Disposition: A | Discharge status: Home / Self Care | Payer: Medicare Other | Source: Ambulatory Visit | Attending: Nephrology | Admitting: Nephrology

## 2013-07-23 DIAGNOSIS — D638 Anemia in other chronic diseases classified elsewhere: Secondary | ICD-10-CM | POA: Insufficient documentation

## 2013-07-23 DIAGNOSIS — N184 Chronic kidney disease, stage 4 (severe): Secondary | ICD-10-CM | POA: Insufficient documentation

## 2013-07-23 LAB — RENAL FUNCTION PANEL
ALBUMIN: 3.6 g/dL (ref 3.5–5.2)
BUN: 82 mg/dL — ABNORMAL HIGH (ref 6–23)
CALCIUM: 9.6 mg/dL (ref 8.4–10.5)
CO2: 24 mEq/L (ref 19–32)
CREATININE: 5.42 mg/dL — AB (ref 0.50–1.10)
Chloride: 99 mEq/L (ref 96–112)
GFR calc Af Amer: 8 mL/min — ABNORMAL LOW (ref >90)
GFR, EST NON AFRICAN AMERICAN: 7 mL/min — AB (ref >90)
Glucose, Bld: 216 mg/dL — ABNORMAL HIGH (ref 70–99)
PHOSPHORUS: 6.6 mg/dL — AB (ref 2.3–4.6)
Potassium: 4 mEq/L (ref 3.7–5.3)
Sodium: 143 mEq/L (ref 137–147)

## 2013-07-23 LAB — IRON AND TIBC
Iron: 57 ug/dL (ref 42–135)
Saturation Ratios: 23 % (ref 20–55)
TIBC: 244 ug/dL — ABNORMAL LOW (ref 250–470)
UIBC: 187 ug/dL (ref 125–400)

## 2013-07-23 LAB — FERRITIN: Ferritin: 180 ng/mL (ref 10–291)

## 2013-07-23 MED ORDER — EPOETIN ALFA 20000 UNIT/ML IJ SOLN: 20000.0000 [IU] | INTRAMUSCULAR | Status: DC

## 2013-07-23 MED ORDER — EPOETIN ALFA 20000 UNIT/ML IJ SOLN
INTRAMUSCULAR | Status: AC
  Administered 2013-07-23: 20000 [IU] via SUBCUTANEOUS
  Filled 2013-07-23: qty 1

## 2013-07-24 LAB — POCT HEMOGLOBIN-HEMACUE: Hemoglobin: 9 g/dL — ABNORMAL LOW (ref 12.0–15.0)

## 2013-07-24 LAB — PTH, INTACT AND CALCIUM
Calcium, Total (PTH): 9.3 mg/dL (ref 8.4–10.5)
PTH: 137.7 pg/mL — ABNORMAL HIGH (ref 14.0–72.0)

## 2013-07-26 ENCOUNTER — Other Ambulatory Visit: Payer: Self-pay | Admitting: Family Medicine

## 2013-08-09 ENCOUNTER — Telehealth: Payer: Self-pay | Admitting: Family Medicine

## 2013-08-09 NOTE — Telephone Encounter (Signed)
Pt in parking lot c/o dizziness.  Need bp check

## 2013-08-09 NOTE — Telephone Encounter (Signed)
Meet pt at her car, pt stated she felt dizzy.  BP 162/70 manually.  Pt stated she only ate breakfast with a cup of coffee.  Blood sugar level was 108 per pt.  Pt advised to eat.  Pt complained of decrease appetite. Appt schedule for Monday 08/13/2013 at 4 PM per pt request.  Pt advised to call before Monday if still feeling dizzy and decrease appetite.  Pt verbalized understanding.  Derl Barrow, RN

## 2013-08-10 ENCOUNTER — Ambulatory Visit: Payer: Medicare Other | Admitting: Family Medicine

## 2013-08-13 ENCOUNTER — Encounter: Payer: Self-pay | Admitting: Family Medicine

## 2013-08-13 ENCOUNTER — Ambulatory Visit (INDEPENDENT_AMBULATORY_CARE_PROVIDER_SITE_OTHER): Payer: Medicare Other | Admitting: Family Medicine

## 2013-08-13 VITALS — BP 183/59 | HR 73 | Temp 97.8°F | Ht 62.0 in | Wt 249.0 lb

## 2013-08-13 DIAGNOSIS — K5900 Constipation, unspecified: Secondary | ICD-10-CM

## 2013-08-13 DIAGNOSIS — K5909 Other constipation: Secondary | ICD-10-CM | POA: Insufficient documentation

## 2013-08-13 MED ORDER — POLYETHYLENE GLYCOL 3350 17 GM/SCOOP PO POWD: 17.0000 g | Freq: Every day | ORAL | Status: DC

## 2013-08-13 NOTE — Assessment & Plan Note (Addendum)
No signs of ileus. Rx miralax daily prn to maintain regular BM's which should aid appetite.

## 2013-08-13 NOTE — Progress Notes (Signed)
Patient ID: Rachel Hobbs, female   DOB: 06/26/1940, 73 y.o.   MRN: [id number]   Subjective:  HPI:   Rachel Hobbs is a 73 y.o. female with a history of ESRD not on RRT here for poor appetite and constipation.  She was unable to get out of the car in the parking lot on Friday because of lightheadedness. CBG wnl at that time, but had eaten very little that day and was made an appointment for today. She reports decreased appetite and constipation. She eats 1 meal per day. In the last 24 hours she's had a hamburger patty, lettuce and tomato in a bun and 2 spoonfuls of baked beans with 1 cup of squash. She also ate half of a peanut butter sandwich. She denies nausea, vomiting, abdominal pain, polyuria, polydipsia. She has taken laxatives in the past for this and that has spurred her appetite but has not done this recently. She experiences no aversive symptoms to food but simply does not have an appetite. No weight loss, night sweats, fevers.   She complains of constipation, having BMs every 2-3 days. She uses dulcolax intermittently about once per day. No blood or pain with defecation. No recent surgeries.   She has been using the cane to ambulate, but still feels somewhat dizzy when she stands up.   Review of Systems: No CP, SOB, leg swelling.  Per HPI. All other systems reviewed and are negative.    Past Medical History: Patient Active Problem List   Diagnosis Date Noted  . Hypoglycemia 01/05/2013  . Chronic venous insufficiency 12/21/2012  . Chronic diastolic heart failure 40/98/1191  . Nodule of soft tissue 06/22/2012  . Nasal congestion 06/22/2012  . Acute on chronic diastolic heart failure 47/82/9562  . Diastolic CHF 13/11/6576  . End stage renal disease 06/14/2011  . Dyspnea 05/11/2011  . GOUT, ACUTE 11/03/2009  . FOOT PAIN, RIGHT 12/11/2008  . LEG EDEMA, BILATERAL 12/03/2008  . ABDOMINAL INCISIONAL HERNIA 07/10/2007  . SMALL BOWEL OBSTRUCTION, HX OF 02/15/2007  . OSTEOARTHROSIS,  LOCAL NOS, OTHER [facility] SITE 12/08/2006  . Carpal tunnel syndrome 08/17/2006  . GLAUCOMA 08/17/2006  . HAIR LOSS 08/17/2006  . DIABETES MELLITUS II, UNCOMPLICATED 46/96/2952  . HYPERLIPIDEMIA 06/16/2006  . Morbid obesity 06/16/2006  . ANEMIA, IRON DEFICIENCY, UNSPEC. 06/16/2006  . HYPERTENSION, BENIGN SYSTEMIC 06/16/2006  . EDEMA-LEGS,DUE TO VENOUS OBSTRUCT. 06/16/2006  . RENAL INSUFFICIENCY, CHRONIC 06/16/2006    Medications: reviewed and updated Current Outpatient Prescriptions  Medication Sig Dispense Refill  . acetaminophen (TYLENOL) 500 MG tablet Take 1,000 mg by mouth every 6 (six) hours as needed for pain.      Marland Kitchen amLODipine (NORVASC) 10 MG tablet Take 1 tablet (10 mg total) by mouth daily.  90 tablet  3  . aspirin 81 MG tablet Take 81 mg by mouth daily.        Marland Kitchen atorvastatin (LIPITOR) 40 MG tablet TAKE 1 TABLET BY MOUTH EVERY DAY FOR CHOLESTEROL  90 tablet  1  . Blood Glucose Monitoring Suppl (BLOOD GLUCOSE METER) kit Use as instructed  1 each  0  . calcitRIOL (ROCALTROL) 0.25 MCG capsule TAKE ONE CAPSULE BY MOUTH DAILY  30 capsule  0  . carvedilol (COREG) 12.5 MG tablet Take 12.5 mg by mouth 2 (two) times daily with a meal.      . cetirizine (ZYRTEC) 10 MG tablet Take 1 tablet (10 mg total) by mouth daily.  30 tablet  0  . docusate sodium (COLACE) 100 MG  capsule Take 100 mg by mouth at bedtime.       . furosemide (LASIX) 80 MG tablet TAKE 2 TABLETS BY MOUTH EVERY DAY IN THE MORNING AND ONE EVERY DAY IN AFTERNOON  270 tablet  1  . glipiZIDE (GLUCOTROL) 10 MG tablet Take 1 tablet (10 mg total) by mouth 2 (two) times daily before a meal.  180 tablet  3  . glucose blood (ONE TOUCH ULTRA TEST) test strip Once daily testing and prn for symptoms of hypoglycemia  100 each  11  . hydrALAZINE (APRESOLINE) 50 MG tablet Take 1.5 tablets (75 mg total) by mouth 3 (three) times daily.  135 tablet  6  . insulin glargine (LANTUS) 100 UNIT/ML injection Inject 0.15 mLs (15 Units total) into the skin  at bedtime.  10 mL  1  . isosorbide mononitrate (IMDUR) 30 MG 24 hr tablet Take 30 mg by mouth daily.      . Lancets (ONETOUCH ULTRASOFT) lancets Once daily testing plus prn for hypoglycemia  100 each  9  . metolazone (ZAROXOLYN) 2.5 MG tablet Take 1 tablet (2.5 mg total) by mouth once a week. And as needed  10 tablet  3  . Multiple Vitamins-Minerals (ONE DAILY WOMENS) TABS Take by mouth daily.        . predniSONE (DELTASONE) 20 MG tablet Take 2 tablets (40 mg total) by mouth daily with breakfast.  8 tablet  0  . traMADol (ULTRAM) 50 MG tablet Take 1 tablet (50 mg total) by mouth every 12 (twelve) hours as needed.  30 tablet  0   No current facility-administered medications for this visit.    Objective:  Physical Exam: BP 183/59  Pulse 73  Temp(Src) 97.8 F (36.6 C) (Oral)  Ht _0  (1.575 m)  Wt 249 lb (112.946 kg)  BMI 45.53 kg/m2  Gen: Obese, chronically ill-appearing 73 y.o. female in NAD HEENT: MMM, EOMI, PERRL, anicteric sclerae +arcus senilis CV: RRR, no MRG, JVD difficult to appreciate Resp: Non-labored, CTAB, no wheezes noted Abd: Soft, obese, nontender, nondistended, BS present, +large ventral hernia MSK: Trace LE pitting edema noted, No deformities Neuro: Alert and oriented, speech normal, shuffling gait    Assessment:     Rachel Hobbs is a 73 y.o. female here for chronic constipation and decreased appetite.     Plan:     See problem list for problem-specific plans.

## 2013-08-13 NOTE — Patient Instructions (Signed)
Thank you for coming in today!  I think if you can have a regular bowel movement you will have a better appetite and will eat better. This will help make you feel better.  - Take miralax once a day every day, and if you have not had a bowel movement in 2 days, increase it to 2 times a day for a day until you have a bowel movement.   Take care and seek immediate care sooner if you develop any concerns.  Please feel free to call with any questions or concerns at any time, at [phone]. - Dr. Bonner Puna

## 2013-08-20 ENCOUNTER — Encounter (HOSPITAL_COMMUNITY)
Admission: RE | Admit: 2013-08-20 | Discharge: 2013-08-20 | Disposition: A | Discharge status: Home / Self Care | Payer: Medicare Other | Source: Ambulatory Visit | Attending: Nephrology | Admitting: Nephrology

## 2013-08-20 DIAGNOSIS — N184 Chronic kidney disease, stage 4 (severe): Secondary | ICD-10-CM | POA: Insufficient documentation

## 2013-08-20 DIAGNOSIS — D638 Anemia in other chronic diseases classified elsewhere: Secondary | ICD-10-CM | POA: Insufficient documentation

## 2013-08-20 LAB — RENAL FUNCTION PANEL
Albumin: 3.4 g/dL — ABNORMAL LOW (ref 3.5–5.2)
BUN: 70 mg/dL — ABNORMAL HIGH (ref 6–23)
CHLORIDE: 96 meq/L (ref 96–112)
CO2: 26 mEq/L (ref 19–32)
Calcium: 10.2 mg/dL (ref 8.4–10.5)
Creatinine, Ser: 5.74 mg/dL — ABNORMAL HIGH (ref 0.50–1.10)
GFR calc non Af Amer: 7 mL/min — ABNORMAL LOW (ref >90)
GFR, EST AFRICAN AMERICAN: 8 mL/min — AB (ref >90)
GLUCOSE: 220 mg/dL — AB (ref 70–99)
Phosphorus: 5.5 mg/dL — ABNORMAL HIGH (ref 2.3–4.6)
Potassium: 4.2 mEq/L (ref 3.7–5.3)
SODIUM: 140 meq/L (ref 137–147)

## 2013-08-20 LAB — IRON AND TIBC
IRON: 56 ug/dL (ref 42–135)
SATURATION RATIOS: 24 % (ref 20–55)
TIBC: 229 ug/dL — ABNORMAL LOW (ref 250–470)
UIBC: 173 ug/dL (ref 125–400)

## 2013-08-20 LAB — POCT HEMOGLOBIN-HEMACUE: Hemoglobin: 9.2 g/dL — ABNORMAL LOW (ref 12.0–15.0)

## 2013-08-20 LAB — FERRITIN: Ferritin: 183 ng/mL (ref 10–291)

## 2013-08-20 MED ORDER — EPOETIN ALFA 20000 UNIT/ML IJ SOLN
INTRAMUSCULAR | Status: AC
  Filled 2013-08-20: qty 1

## 2013-08-20 MED ORDER — EPOETIN ALFA 20000 UNIT/ML IJ SOLN
20000.0000 [IU] | INTRAMUSCULAR | Status: DC
  Administered 2013-08-20: 20000 [IU] via SUBCUTANEOUS

## 2013-08-21 LAB — PTH, INTACT AND CALCIUM
Calcium, Total (PTH): 10.2 mg/dL (ref 8.4–10.5)
PTH: 35.8 pg/mL (ref 14.0–72.0)

## 2013-08-24 ENCOUNTER — Other Ambulatory Visit (HOSPITAL_COMMUNITY): Payer: Self-pay | Admitting: *Deleted

## 2013-08-27 ENCOUNTER — Encounter (HOSPITAL_COMMUNITY): Payer: Self-pay

## 2013-08-27 ENCOUNTER — Encounter (HOSPITAL_COMMUNITY)
Admission: RE | Admit: 2013-08-27 | Discharge: 2013-08-27 | Disposition: A | Discharge status: Home / Self Care | Payer: Medicare Other | Source: Ambulatory Visit | Attending: Nephrology | Admitting: Nephrology

## 2013-08-27 MED ORDER — FERUMOXYTOL INJECTION 510 MG/17 ML
1020.0000 mg | Freq: Once | INTRAVENOUS | Status: AC
  Administered 2013-08-27: 1020 mg via INTRAVENOUS
  Filled 2013-08-27: qty 34

## 2013-09-03 ENCOUNTER — Encounter: Payer: Self-pay | Admitting: Family Medicine

## 2013-09-14 ENCOUNTER — Other Ambulatory Visit (HOSPITAL_COMMUNITY): Payer: Self-pay | Admitting: Cardiology

## 2013-09-14 DIAGNOSIS — I509 Heart failure, unspecified: Secondary | ICD-10-CM

## 2013-09-17 ENCOUNTER — Encounter (HOSPITAL_COMMUNITY): Payer: Medicare Other

## 2013-09-19 ENCOUNTER — Encounter (HOSPITAL_COMMUNITY): Payer: Self-pay

## 2013-09-19 ENCOUNTER — Ambulatory Visit (HOSPITAL_COMMUNITY)
Admission: RE | Admit: 2013-09-19 | Discharge: 2013-09-19 | Disposition: A | Discharge status: Home / Self Care | Payer: Medicare Other | Source: Ambulatory Visit | Attending: Internal Medicine | Admitting: Internal Medicine

## 2013-09-19 ENCOUNTER — Ambulatory Visit (HOSPITAL_BASED_OUTPATIENT_CLINIC_OR_DEPARTMENT_OTHER)
Admission: RE | Admit: 2013-09-19 | Discharge: 2013-09-19 | Disposition: A | Discharge status: Home / Self Care | Payer: Medicare Other | Source: Ambulatory Visit | Attending: Nephrology | Admitting: Nephrology

## 2013-09-19 VITALS — BP 154/72 | HR 66 | Wt 253.0 lb

## 2013-09-19 DIAGNOSIS — I5032 Chronic diastolic (congestive) heart failure: Secondary | ICD-10-CM

## 2013-09-19 DIAGNOSIS — I509 Heart failure, unspecified: Secondary | ICD-10-CM

## 2013-09-19 DIAGNOSIS — N186 End stage renal disease: Secondary | ICD-10-CM

## 2013-09-19 DIAGNOSIS — I5030 Unspecified diastolic (congestive) heart failure: Secondary | ICD-10-CM

## 2013-09-19 DIAGNOSIS — I10 Essential (primary) hypertension: Secondary | ICD-10-CM

## 2013-09-19 DIAGNOSIS — I517 Cardiomegaly: Secondary | ICD-10-CM

## 2013-09-19 NOTE — Patient Instructions (Signed)
We will contact you in 4 months to schedule your next appointment.  

## 2013-09-19 NOTE — Addendum Note (Signed)
Encounter addended by: Scarlette Calico, RN on: 09/19/2013  2:44 PM<BR>     Documentation filed: Patient Instructions Section

## 2013-09-19 NOTE — Progress Notes (Signed)
*  PRELIMINARY RESULTS* Echocardiogram 2D Echocardiogram has been performed.  Elvia Collum 09/19/2013, 1:56 PM

## 2013-09-19 NOTE — Progress Notes (Signed)
Patient ID: [name], female   DOB: 1940-10-20, 73 y.o.   MRN: [id number]  Nephrologist: Dr. Moshe Cipro PCP: Dr. Ferd Glassing ([facility])  HPI:   Ms Cosper is a 73 year old with a PMH of morbid obesity, chronic diastolic heart failure, DM, CKD stage IV-V (most recent Cr 5.7/BUN 37 in 5/15), HTN, and abdominal hernia (CT abdomen 04/2011). She is not on Ace/Arb due to CKD.   ECHO 06/2011 EF 86-76% Grade II diastolic heart failure. RV normal   Ad[facility] 04/21/2012 due to dyspnea. Pro BNP 720.8 Diuresed with IV lasix and transitioned to Lasix 160 mg in am and 80 mg at night. Discharged weight 284 pounds. D/C Creatinine 4.03   She feels pretty good. If she walks too fast gets SOB and has to stop. Weighing most days. Weight down about 10 pounds. Weighted yesterday and was 247 (previously 720-947).  She has not taken extra fluid pills.  No PND.  No edema. Has not had HD access placed yet. Main complaint is left sided abdominal wall hernia. At last visit we increased hydralazine to 75 tid but BP dropped to low. Now taking 50 bid  ROS: All systems negative except as listed in HPI, PMH and Problem List.  Past Medical History  Diagnosis Date  . Depression   . Hyperlipidemia   . Hypertension   . Diabetes mellitus   . Hernia     Current Outpatient Prescriptions  Medication Sig Dispense Refill  . acetaminophen (TYLENOL) 500 MG tablet Take 1,000 mg by mouth every 6 (six) hours as needed for pain.      Marland Kitchen amLODipine (NORVASC) 10 MG tablet Take 1 tablet (10 mg total) by mouth daily.  90 tablet  3  . aspirin 81 MG tablet Take 81 mg by mouth daily.        Marland Kitchen atorvastatin (LIPITOR) 40 MG tablet TAKE 1 TABLET BY MOUTH EVERY DAY FOR CHOLESTEROL  90 tablet  1  . Blood Glucose Monitoring Suppl (BLOOD GLUCOSE METER) kit Use as instructed  1 each  0  . calcitRIOL (ROCALTROL) 0.25 MCG capsule TAKE ONE CAPSULE BY MOUTH DAILY  30 capsule  0  . carvedilol (COREG) 12.5 MG tablet Take 12.5 mg by mouth 2 (two) times  daily with a meal.      . cetirizine (ZYRTEC) 10 MG tablet Take 1 tablet (10 mg total) by mouth daily.  30 tablet  0  . docusate sodium (COLACE) 100 MG capsule Take 100 mg by mouth at bedtime.       . furosemide (LASIX) 80 MG tablet TAKE 2 TABLETS BY MOUTH EVERY DAY IN THE MORNING AND ONE EVERY DAY IN AFTERNOON  270 tablet  1  . glipiZIDE (GLUCOTROL) 10 MG tablet Take 1 tablet (10 mg total) by mouth 2 (two) times daily before a meal.  180 tablet  3  . glucose blood (ONE TOUCH ULTRA TEST) test strip Once daily testing and prn for symptoms of hypoglycemia  100 each  11  . hydrALAZINE (APRESOLINE) 50 MG tablet Take 1.5 tablets (75 mg total) by mouth 3 (three) times daily.  135 tablet  6  . insulin glargine (LANTUS) 100 UNIT/ML injection Inject 0.15 mLs (15 Units total) into the skin at bedtime.  10 mL  1  . isosorbide mononitrate (IMDUR) 30 MG 24 hr tablet Take 30 mg by mouth daily.      . Lancets (ONETOUCH ULTRASOFT) lancets Once daily testing plus prn for hypoglycemia  100 each  9  . metolazone (ZAROXOLYN) 2.5 MG tablet Take 1 tablet (2.5 mg total) by mouth once a week. And as needed  10 tablet  3  . Multiple Vitamins-Minerals (ONE DAILY WOMENS) TABS Take by mouth daily.        . polyethylene glycol powder (GLYCOLAX/MIRALAX) powder Take 17 g by mouth daily.  850 g  1  . predniSONE (DELTASONE) 20 MG tablet Take 2 tablets (40 mg total) by mouth daily with breakfast.  8 tablet  0  . traMADol (ULTRAM) 50 MG tablet Take 1 tablet (50 mg total) by mouth every 12 (twelve) hours as needed.  30 tablet  0   No current facility-administered medications for this encounter.     PHYSICAL EXAM: Filed Vitals:   09/19/13 1354  BP: 154/72  Pulse: 66  Weight: 253 lb (114.76 kg)  SpO2: 96%    General:  Looks young her than stated age. Sitting in [place]. No resp difficulty HEENT: normal Neck: supple. Thick  JVP appears up to 6 but difficult to assess due to body habitus. Carotids 2+ bilaterally; no bruits. No  lymphadenopathy or thryomegaly appreciated. Cor: PMI nonpalpable Regular rate & rhythm. No rubs, gallops or murmurs. Lungs: clear Abdomen: soft, nontender, nondistended. No hepatosplenomegaly. No bruits or masses. Good bowel sounds. Large left-sided ab wall hernia. Nontender Extremities: no cyanosis, clubbing, rash, Chronic venous stasis changes in ankles.  No edema Neuro: alert & orientedx3, cranial nerves grossly intact. Moves all 4 extremities w/o difficulty. Affect pleasant. ]  ASSESSMENT & PLAN: 1. Chronic diastolic HF 2. Acute on chronic renal failure, stage V 3. Morbid obesity 4. HTN, uncontrolled  Overall volume status looks ok but renal function appears to be getting worse. I suspect she will need HD soon or at least have access placed. We discussed this. She has appt with Dr. Moshe Cipro soon. Reinforced need for daily weights and reviewed use of sliding scale diuretics. BP back up. Will have her take hydralazine 50 tiid.   RTC in 4 months.   Shaune Pascal Bensimhon,MD 2:08 PM

## 2013-09-24 ENCOUNTER — Encounter (HOSPITAL_COMMUNITY)
Admission: RE | Admit: 2013-09-24 | Discharge: 2013-09-24 | Disposition: A | Discharge status: Home / Self Care | Payer: Medicare Other | Source: Ambulatory Visit | Attending: Nephrology | Admitting: Nephrology

## 2013-09-24 DIAGNOSIS — N184 Chronic kidney disease, stage 4 (severe): Secondary | ICD-10-CM | POA: Insufficient documentation

## 2013-09-24 DIAGNOSIS — D638 Anemia in other chronic diseases classified elsewhere: Secondary | ICD-10-CM | POA: Diagnosis not present

## 2013-09-24 LAB — FERRITIN: FERRITIN: 499 ng/mL — AB (ref 10–291)

## 2013-09-24 LAB — RENAL FUNCTION PANEL
ALBUMIN: 3.6 g/dL (ref 3.5–5.2)
BUN: 81 mg/dL — AB (ref 6–23)
CHLORIDE: 97 meq/L (ref 96–112)
CO2: 25 meq/L (ref 19–32)
CREATININE: 5.89 mg/dL — AB (ref 0.50–1.10)
Calcium: 10.8 mg/dL — ABNORMAL HIGH (ref 8.4–10.5)
GFR calc Af Amer: 7 mL/min — ABNORMAL LOW (ref >90)
GFR calc non Af Amer: 6 mL/min — ABNORMAL LOW (ref >90)
Glucose, Bld: 154 mg/dL — ABNORMAL HIGH (ref 70–99)
POTASSIUM: 4.5 meq/L (ref 3.7–5.3)
Phosphorus: 5.3 mg/dL — ABNORMAL HIGH (ref 2.3–4.6)
Sodium: 140 mEq/L (ref 137–147)

## 2013-09-24 LAB — POCT HEMOGLOBIN-HEMACUE: Hemoglobin: 9.3 g/dL — ABNORMAL LOW (ref 12.0–15.0)

## 2013-09-24 LAB — IRON AND TIBC
IRON: 75 ug/dL (ref 42–135)
SATURATION RATIOS: 35 % (ref 20–55)
TIBC: 212 ug/dL — AB (ref 250–470)
UIBC: 137 ug/dL (ref 125–400)

## 2013-09-24 MED ORDER — EPOETIN ALFA 20000 UNIT/ML IJ SOLN
INTRAMUSCULAR | Status: AC
  Administered 2013-09-24: 20000 [IU] via SUBCUTANEOUS
  Filled 2013-09-24: qty 1

## 2013-09-24 MED ORDER — EPOETIN ALFA 20000 UNIT/ML IJ SOLN: 20000.0000 [IU] | INTRAMUSCULAR | Status: DC

## 2013-09-25 ENCOUNTER — Other Ambulatory Visit (HOSPITAL_COMMUNITY): Payer: Self-pay | Admitting: Internal Medicine

## 2013-09-25 LAB — PTH, INTACT AND CALCIUM
Calcium, Total (PTH): 10.1 mg/dL (ref 8.4–10.5)
PTH: 44 pg/mL (ref 14.0–72.0)

## 2013-10-08 ENCOUNTER — Encounter: Payer: Self-pay | Admitting: Home Health Services

## 2013-10-08 NOTE — Progress Notes (Signed)
Patient ID: Rachel Hobbs, female   DOB: 10/23/1940, 73 y.o.   MRN: [id number] I have reviewed this visit and discussed with Lamont Dowdy and agree with her documentation.   Olam Idler, MD 12/06/2013, 6:06 PM PGY-2, [place]

## 2013-10-16 ENCOUNTER — Ambulatory Visit (INDEPENDENT_AMBULATORY_CARE_PROVIDER_SITE_OTHER): Payer: Medicare Other

## 2013-10-16 VITALS — BP 198/98 | HR 74 | Resp 16

## 2013-10-16 DIAGNOSIS — M79609 Pain in unspecified limb: Secondary | ICD-10-CM

## 2013-10-16 DIAGNOSIS — M19071 Primary osteoarthritis, right ankle and foot: Secondary | ICD-10-CM

## 2013-10-16 DIAGNOSIS — M79606 Pain in leg, unspecified: Secondary | ICD-10-CM

## 2013-10-16 DIAGNOSIS — E1142 Type 2 diabetes mellitus with diabetic polyneuropathy: Secondary | ICD-10-CM

## 2013-10-16 DIAGNOSIS — E1149 Type 2 diabetes mellitus with other diabetic neurological complication: Secondary | ICD-10-CM

## 2013-10-16 DIAGNOSIS — E1140 Type 2 diabetes mellitus with diabetic neuropathy, unspecified: Secondary | ICD-10-CM

## 2013-10-16 DIAGNOSIS — M19079 Primary osteoarthritis, unspecified ankle and foot: Secondary | ICD-10-CM

## 2013-10-16 DIAGNOSIS — B351 Tinea unguium: Secondary | ICD-10-CM

## 2013-10-16 NOTE — Patient Instructions (Signed)
Diabetes and Foot Care Diabetes may cause you to have problems because of poor blood supply (circulation) to your feet and legs. This may cause the skin on your feet to become thinner, break easier, and heal more slowly. Your skin may become dry, and the skin may peel and crack. You may also have nerve damage in your legs and feet causing decreased feeling in them. You may not notice minor injuries to your feet that could lead to infections or more serious problems. Taking care of your feet is one of the most important things you can do for yourself.  HOME CARE INSTRUCTIONS  Wear shoes at all times, even in the house. Do not go barefoot. Bare feet are easily injured.  Check your feet daily for blisters, cuts, and redness. If you cannot see the bottom of your feet, use a mirror or ask someone for help.  Wash your feet with warm water (do not use hot water) and mild soap. Then pat your feet and the areas between your toes until they are completely dry. Do not soak your feet as this can dry your skin.  Apply a moisturizing lotion or petroleum jelly (that does not contain alcohol and is unscented) to the skin on your feet and to dry, brittle toenails. Do not apply lotion between your toes.  Trim your toenails straight across. Do not dig under them or around the cuticle. File the edges of your nails with an emery board or nail file.  Do not cut corns or calluses or try to remove them with medicine.  Wear clean socks or stockings every day. Make sure they are not too tight. Do not wear knee-high stockings since they may decrease blood flow to your legs.  Wear shoes that fit properly and have enough cushioning. To break in new shoes, wear them for just a few hours a day. This prevents you from injuring your feet. Always look in your shoes before you put them on to be sure there are no objects inside.  Do not cross your legs. This may decrease the blood flow to your feet.  If you find a minor scrape,  cut, or break in the skin on your feet, keep it and the skin around it clean and dry. These areas may be cleansed with mild soap and water. Do not cleanse the area with peroxide, alcohol, or iodine.  When you remove an adhesive bandage, be sure not to damage the skin around it.  If you have a wound, look at it several times a day to make sure it is healing.  Do not use heating pads or hot water bottles. They may burn your skin. If you have lost feeling in your feet or legs, you may not know it is happening until it is too late.  Make sure your health care provider performs a complete foot exam at least annually or more often if you have foot problems. Report any cuts, sores, or bruises to your health care provider immediately. SEEK MEDICAL CARE IF:   You have an injury that is not healing.  You have cuts or breaks in the skin.  You have an ingrown nail.  You notice redness on your legs or feet.  You feel burning or tingling in your legs or feet.  You have pain or cramps in your legs and feet.  Your legs or feet are numb.  Your feet always feel cold. SEEK IMMEDIATE MEDICAL CARE IF:   There is increasing redness,   swelling, or pain in or around a wound.  There is a red line that goes up your leg.  Pus is coming from a wound.  You develop a fever or as directed by your health care provider.  You notice a bad smell coming from an ulcer or wound. Document Released: 04/02/2000 Document Revised: 12/06/2012 Document Reviewed: 09/12/2012 Exit[employer] Patient Information 2015 [employer]. This information is not intended to replace advice given to you by your health care provider. Make sure you discuss any questions you have with your health care provider.  

## 2013-10-16 NOTE — Progress Notes (Signed)
   Subjective:    Patient ID: Rachel Hobbs, female    DOB: 02-20-1941, 73 y.o.   MRN: [id number]  HPI Comments: "I need these toenails cut and I'm getting these nerve pains in my feet."  Patient states that her last A1C was 6 something     Review of Systems no new findings or systemic changes      Objective:   Physical Exam 73 year old [place] American female presents at this time well-developed well-nourished oriented x3 patient is here for diabetic foot and nail care and palliative debridement of nails however has a new issue indicates that her right foot in the forefoot areas been more painful tender symptomatically last couple of weeks pain on direct lateral compression the forefoot no history of injury or trauma is noted patient does have history peripheral neuropathy and diabetic neuropathy with peripheral edema plus one +2 pitting being noted next progress largely objective findings as follows vascular status is diminished DP +1 to +2/4 bilateral PT one over 4 bilateral mild to +1 edema noted bilateral epicritic sensations diminished on Semmes Weinstein testing to forefoot and plantar arch and digits neurologically skin color pigment normal hair growth absent nails brittle crumbly friable incurvated discolored and tender both on palpation and with enclosed shoe wear. The left foot is unremarkable on compression and palpation of the right foot has exquisite tenderness over the metatarsals possibly early neuroma type symptomology can't be ruled out cannot rule out possibility of a stress fracture or injury. There is digital contractures noted x-rays taken at this time reveal no open fractures or displacements mild promontory changes subluxation Lisfranc for 5 and cuboid consistent with early osteoarthropathy promontory changes of the foot patient may also have early neuroma symptomology most significant finding on radiograph of the right foot is calcified vessels in particular the dorsalis pedis  is significant calcified other smaller vessels also show calcification.       Assessment & Plan:  Assessment diabetes with peripheral neuropathy and angiopathy with calcified vessels been noted on x-ray patient does have possibly early neuroma symptomology are exacerbated neuritis with osteoarthropathy the right foot more so than left foot. Plan at this time maintain a compressive were stable and firm soled shoe no open wounds or ulcers are noted at this time however nails thick brittle crumbly friable mycotic nails 1 through 5 bilateral are debrided and the presence of pain in symptomology as well as her diabetes return for future palliative care for 3 months we'll obtain authorization for diabetic extra-depth shoes per patient request my recommendation should note patient does have digital contractures with findings of osteoarthropathy deformity of both feet M.D. abnormality would benefit from diabetic extra-depth shoes and molded insoles help stabilize the foot and prevent further deterioration in arthropathy in the midfoot we'll obtain authorization for shoes and followup appropriately once authorization obtained  Harriet Masson DPM

## 2013-10-19 ENCOUNTER — Encounter: Payer: Self-pay | Admitting: Home Health Services

## 2013-10-19 NOTE — Progress Notes (Signed)
This is the patient that epic says you have open

## 2013-10-22 ENCOUNTER — Encounter (HOSPITAL_COMMUNITY)
Admission: RE | Admit: 2013-10-22 | Discharge: 2013-10-22 | Disposition: A | Discharge status: Home / Self Care | Payer: Medicare Other | Source: Ambulatory Visit | Attending: Nephrology | Admitting: Nephrology

## 2013-10-22 DIAGNOSIS — D638 Anemia in other chronic diseases classified elsewhere: Secondary | ICD-10-CM | POA: Insufficient documentation

## 2013-10-22 DIAGNOSIS — N184 Chronic kidney disease, stage 4 (severe): Secondary | ICD-10-CM | POA: Insufficient documentation

## 2013-10-22 LAB — POCT HEMOGLOBIN-HEMACUE: Hemoglobin: 9.5 g/dL — ABNORMAL LOW (ref 12.0–15.0)

## 2013-10-22 LAB — RENAL FUNCTION PANEL
Albumin: 3.6 g/dL (ref 3.5–5.2)
Anion gap: 20 — ABNORMAL HIGH (ref 5–15)
BUN: 82 mg/dL — AB (ref 6–23)
CALCIUM: 9.6 mg/dL (ref 8.4–10.5)
CO2: 24 mEq/L (ref 19–32)
Chloride: 98 mEq/L (ref 96–112)
Creatinine, Ser: 6.12 mg/dL — ABNORMAL HIGH (ref 0.50–1.10)
GFR calc Af Amer: 7 mL/min — ABNORMAL LOW (ref >90)
GFR calc non Af Amer: 6 mL/min — ABNORMAL LOW (ref >90)
Glucose, Bld: 220 mg/dL — ABNORMAL HIGH (ref 70–99)
PHOSPHORUS: 5.9 mg/dL — AB (ref 2.3–4.6)
Potassium: 4.4 mEq/L (ref 3.7–5.3)
Sodium: 142 mEq/L (ref 137–147)

## 2013-10-22 LAB — IRON AND TIBC
Iron: 68 ug/dL (ref 42–135)
SATURATION RATIOS: 32 % (ref 20–55)
TIBC: 214 ug/dL — ABNORMAL LOW (ref 250–470)
UIBC: 146 ug/dL (ref 125–400)

## 2013-10-22 LAB — FERRITIN: Ferritin: 435 ng/mL — ABNORMAL HIGH (ref 10–291)

## 2013-10-22 MED ORDER — EPOETIN ALFA 20000 UNIT/ML IJ SOLN
20000.0000 [IU] | INTRAMUSCULAR | Status: DC
  Administered 2013-10-22: 20000 [IU] via SUBCUTANEOUS

## 2013-10-22 MED ORDER — EPOETIN ALFA 20000 UNIT/ML IJ SOLN
INTRAMUSCULAR | Status: AC
  Administered 2013-10-22: 20000 [IU] via SUBCUTANEOUS
  Filled 2013-10-22: qty 1

## 2013-10-23 LAB — PTH, INTACT AND CALCIUM
CALCIUM TOTAL (PTH): 8.9 mg/dL (ref 8.4–10.5)
PTH: 90.6 pg/mL — AB (ref 14.0–72.0)

## 2013-10-29 ENCOUNTER — Encounter: Payer: Self-pay | Admitting: Family Medicine

## 2013-10-29 ENCOUNTER — Ambulatory Visit (INDEPENDENT_AMBULATORY_CARE_PROVIDER_SITE_OTHER): Payer: Medicare Other | Admitting: Family Medicine

## 2013-10-29 VITALS — BP 163/70 | HR 71 | Temp 98.2°F | Wt 247.0 lb

## 2013-10-29 DIAGNOSIS — K5900 Constipation, unspecified: Secondary | ICD-10-CM

## 2013-10-29 DIAGNOSIS — M25551 Pain in right hip: Secondary | ICD-10-CM | POA: Insufficient documentation

## 2013-10-29 DIAGNOSIS — K5909 Other constipation: Secondary | ICD-10-CM

## 2013-10-29 DIAGNOSIS — R109 Unspecified abdominal pain: Secondary | ICD-10-CM

## 2013-10-29 MED ORDER — CYCLOBENZAPRINE HCL 5 MG PO TABS: 5.0000 mg | ORAL_TABLET | Freq: Three times a day (TID) | ORAL | Status: DC | PRN

## 2013-10-29 MED ORDER — SENNA 8.6 MG PO TABS: 1.0000 | ORAL_TABLET | Freq: Every day | ORAL | Status: DC | PRN

## 2013-10-29 MED ORDER — GLYCERIN (LAXATIVE) 2 G RE SUPP: 1.0000 | Freq: Once | RECTAL | Status: DC

## 2013-10-29 NOTE — Progress Notes (Signed)
Subjective:     Patient ID: Rachel Hobbs, female   DOB: 1940/09/21, 73 y.o.   MRN: [id number]  Patient presents for a same day appointment.  HPI  FLANK PAIN, RIGHT: - Reports "hurting on right side / hip area", stated that she woke up with it last week, denies any provoking incident, initially thought to be improving, now seems to be persistent with movements. Described as "sharp, intermittent catching, ache, 7/10" - Pain is worse with movements, trying to get up, twisting movement. Resting without moving helps. - Denies any injury, accident or fall - Hx left abdominal hernia repair, years ago unable to lay on left side of body, mostly lays on right side - Denies dysuria, hematuria, fever/chills, frequency. No abdominal pain, nausea / vomiting  CONSTIPATION: - Complains of constipation, takes Dulcolax oral. No longer taking Miralax - Last BM 2-3 days ago, large amount firm, admits to currently passing some gas - Tolerating regular diet PO - Denies any bloody stools, diarrhea  I have reviewed and updated the following as appropriate: allergies and current medications  Social Hx: Never smoker  Review of Systems  See above HPI    Objective:   Physical Exam  BP 163/70  Pulse 71  Temp(Src) 98.2 F (36.8 C) (Oral)  Wt 247 lb (112.038 kg)  Gen - obese, chronically ill, some discomfort on exam otherwise, NAD Heart - RRR, no murmurs heard Lungs - CTAB, no wheezing, crackles, or rhonchi. Normal work of breathing. Abd - obese, soft, non-tender, no rebound, no guarding, +significant left-sided firm distended ventral hernia, +active BS MSK - R-hip: +TTP above greater trochanter, R-hip FROM on passive range of motion without significant tenderness, mild discomfort with external rotation, lower back paraspinal non-tender, without CVAT  Ext - non-tender, no edema, peripheral pulses intact +2 b/l Neuro - awake, alert     Assessment:     See specific A&P problem list for details.       Plan:     See specific A&P problem list for details.

## 2013-10-29 NOTE — Patient Instructions (Addendum)
Dear Edison Nasuti, Thank you for coming in to clinic today.  Today we discussed your Right Hip Pain and Constipation. 1. For your Right Sided Pain it seems most consistent with a muscle strain / hip pain, especially with the description of "catching". 2. Sent new prescription for Flexeril, which is a muscle relaxant, it may make you sleep, so be careful when you first take it. You may take it up to 3 times a day as needed, try first dose at night to see how you tolerate it. 3. Also, I recommend taking regular Tylenol 500 to 1000mg  at a time (1 to 2 extra strength tabs) you can do this up to 3 times a day as needed. 4. Important to provide support on that side with extra cushions when you are laying down, and take it easy, try not to over do it with activity. Keep moving still. 5. For your constipation, I have sent in a new prescription for Senna, please take this starting with 1 tablet daily, and then you may go up to 2 tabs daily. Continue to take Dulcolax. If you go more than 3-4 days without a bowel movement, you can take one of the Glycerin Suppositories.  Please schedule a follow-up appointment with Dr. Berkley Harvey in 1 to 2 weeks to follow-up to see if your symptoms have improved.  If you have any other questions or concerns, please feel free to call the clinic to contact me. You may also schedule an earlier appointment if necessary.  However, if your symptoms get significantly worse, please go to the Emergency Department to seek immediate medical attention.  Nobie Putnam, [place]

## 2013-10-29 NOTE — Assessment & Plan Note (Signed)
Suspected MSK etiology, muscle strain vs possible trochanteric bursitis, in setting of obese deconditioned pt with stress on R-side body due to limited ability to lay on left side - Benign abdomen, except chronic L-abdominal incisional hernia - No evidence of urinary etiology  Plan: 1. Rx Flexeril 5mg  up to TID PRN (may try half, 2.5mg  if better tolerated) 2. Regularly scheduled Tylenol x 1-2 weeks. Unable to use NSAIDs with ESRD 3. Relative rest but remain active, cushion to avoid pressure on Right hip 4. RTC if worsening 2-4 weeks for re-eval

## 2013-10-29 NOTE — Assessment & Plan Note (Addendum)
Acute on chronic constipation - No evidence of obstruction, active BS, passing gas, recent BM  Plan: 1. Start trial on Senna 1-2 tabs daily x 1-2 weeks 2. Glycerin suppository #3 PRN if acutely worsening 3. Continue Colace 4. Recommend re-try Miralax if tolerated, inc fiber diet 5. RTC 1-2 weeks if no improvement

## 2013-11-05 ENCOUNTER — Other Ambulatory Visit: Payer: Self-pay | Admitting: Family Medicine

## 2013-11-05 DIAGNOSIS — Z1231 Encounter for screening mammogram for malignant neoplasm of breast: Secondary | ICD-10-CM

## 2013-11-06 ENCOUNTER — Encounter: Payer: Self-pay | Admitting: Family Medicine

## 2013-11-17 ENCOUNTER — Other Ambulatory Visit (HOSPITAL_COMMUNITY): Payer: Self-pay | Admitting: Internal Medicine

## 2013-11-19 ENCOUNTER — Encounter (HOSPITAL_COMMUNITY)
Admission: RE | Admit: 2013-11-19 | Discharge: 2013-11-19 | Disposition: A | Discharge status: Home / Self Care | Payer: Medicare Other | Source: Ambulatory Visit | Attending: Nephrology | Admitting: Nephrology

## 2013-11-19 DIAGNOSIS — N184 Chronic kidney disease, stage 4 (severe): Secondary | ICD-10-CM | POA: Diagnosis not present

## 2013-11-19 DIAGNOSIS — D638 Anemia in other chronic diseases classified elsewhere: Secondary | ICD-10-CM | POA: Insufficient documentation

## 2013-11-19 LAB — RENAL FUNCTION PANEL
Albumin: 3.5 g/dL (ref 3.5–5.2)
Anion gap: 18 — ABNORMAL HIGH (ref 5–15)
BUN: 75 mg/dL — AB (ref 6–23)
CO2: 27 mEq/L (ref 19–32)
CREATININE: 6.3 mg/dL — AB (ref 0.50–1.10)
Calcium: 10.3 mg/dL (ref 8.4–10.5)
Chloride: 96 mEq/L (ref 96–112)
GFR calc Af Amer: 7 mL/min — ABNORMAL LOW (ref >90)
GFR calc non Af Amer: 6 mL/min — ABNORMAL LOW (ref >90)
Glucose, Bld: 156 mg/dL — ABNORMAL HIGH (ref 70–99)
PHOSPHORUS: 6.2 mg/dL — AB (ref 2.3–4.6)
Potassium: 4.1 mEq/L (ref 3.7–5.3)
Sodium: 141 mEq/L (ref 137–147)

## 2013-11-19 LAB — IRON AND TIBC
IRON: 65 ug/dL (ref 42–135)
Saturation Ratios: 29 % (ref 20–55)
TIBC: 222 ug/dL — ABNORMAL LOW (ref 250–470)
UIBC: 157 ug/dL (ref 125–400)

## 2013-11-19 LAB — FERRITIN: FERRITIN: 435 ng/mL — AB (ref 10–291)

## 2013-11-19 LAB — POCT HEMOGLOBIN-HEMACUE: HEMOGLOBIN: 9.6 g/dL — AB (ref 12.0–15.0)

## 2013-11-19 MED ORDER — EPOETIN ALFA 20000 UNIT/ML IJ SOLN
20000.0000 [IU] | INTRAMUSCULAR | Status: DC
  Administered 2013-11-19: 20000 [IU] via SUBCUTANEOUS

## 2013-11-19 MED ORDER — EPOETIN ALFA 20000 UNIT/ML IJ SOLN
INTRAMUSCULAR | Status: AC
  Administered 2013-11-19: 20000 [IU] via SUBCUTANEOUS
  Filled 2013-11-19: qty 1

## 2013-11-20 LAB — PTH, INTACT AND CALCIUM
CALCIUM TOTAL (PTH): 10 mg/dL (ref 8.4–10.5)
PTH: 54.2 pg/mL (ref 14.0–72.0)

## 2013-11-21 ENCOUNTER — Encounter: Payer: Self-pay | Admitting: Family Medicine

## 2013-11-28 ENCOUNTER — Other Ambulatory Visit: Payer: Self-pay | Admitting: Family Medicine

## 2013-12-07 ENCOUNTER — Other Ambulatory Visit: Payer: Self-pay | Admitting: Family Medicine

## 2013-12-14 ENCOUNTER — Other Ambulatory Visit (HOSPITAL_COMMUNITY): Payer: Self-pay | Admitting: *Deleted

## 2013-12-17 ENCOUNTER — Encounter (HOSPITAL_COMMUNITY)
Admission: RE | Admit: 2013-12-17 | Discharge: 2013-12-17 | Disposition: A | Discharge status: Home / Self Care | Payer: Medicare Other | Source: Ambulatory Visit | Attending: Nephrology | Admitting: Nephrology

## 2013-12-17 DIAGNOSIS — D638 Anemia in other chronic diseases classified elsewhere: Secondary | ICD-10-CM | POA: Diagnosis not present

## 2013-12-17 LAB — IRON AND TIBC
Iron: 60 ug/dL (ref 42–135)
Saturation Ratios: 25 % (ref 20–55)
TIBC: 241 ug/dL — ABNORMAL LOW (ref 250–470)
UIBC: 181 ug/dL (ref 125–400)

## 2013-12-17 LAB — POCT HEMOGLOBIN-HEMACUE: Hemoglobin: 9.1 g/dL — ABNORMAL LOW (ref 12.0–15.0)

## 2013-12-17 LAB — RENAL FUNCTION PANEL
ANION GAP: 18 — AB (ref 5–15)
Albumin: 3.6 g/dL (ref 3.5–5.2)
BUN: 64 mg/dL — ABNORMAL HIGH (ref 6–23)
CO2: 24 mEq/L (ref 19–32)
CREATININE: 5.77 mg/dL — AB (ref 0.50–1.10)
Calcium: 10.9 mg/dL — ABNORMAL HIGH (ref 8.4–10.5)
Chloride: 97 mEq/L (ref 96–112)
GFR calc Af Amer: 8 mL/min — ABNORMAL LOW (ref >90)
GFR calc non Af Amer: 7 mL/min — ABNORMAL LOW (ref >90)
GLUCOSE: 177 mg/dL — AB (ref 70–99)
PHOSPHORUS: 5 mg/dL — AB (ref 2.3–4.6)
POTASSIUM: 4.2 meq/L (ref 3.7–5.3)
Sodium: 139 mEq/L (ref 137–147)

## 2013-12-17 LAB — FERRITIN: Ferritin: 368 ng/mL — ABNORMAL HIGH (ref 10–291)

## 2013-12-17 MED ORDER — EPOETIN ALFA 20000 UNIT/ML IJ SOLN
INTRAMUSCULAR | Status: AC
  Administered 2013-12-17: 20000 [IU] via SUBCUTANEOUS
  Filled 2013-12-17: qty 1

## 2013-12-17 MED ORDER — EPOETIN ALFA 20000 UNIT/ML IJ SOLN
20000.0000 [IU] | INTRAMUSCULAR | Status: DC
  Administered 2013-12-17: 20000 [IU] via SUBCUTANEOUS

## 2013-12-18 LAB — PTH, INTACT AND CALCIUM
Calcium, Total (PTH): 11.3 mg/dL — ABNORMAL HIGH (ref 8.4–10.5)
PTH: 22 pg/mL (ref 14–64)

## 2013-12-19 ENCOUNTER — Ambulatory Visit (HOSPITAL_COMMUNITY)
Admission: RE | Admit: 2013-12-19 | Discharge: 2013-12-19 | Disposition: A | Discharge status: Home / Self Care | Payer: Medicare Other | Source: Ambulatory Visit | Attending: Family Medicine | Admitting: Family Medicine

## 2013-12-19 DIAGNOSIS — Z1231 Encounter for screening mammogram for malignant neoplasm of breast: Secondary | ICD-10-CM | POA: Diagnosis not present

## 2013-12-25 ENCOUNTER — Encounter (HOSPITAL_COMMUNITY): Payer: Self-pay | Admitting: Emergency Medicine

## 2013-12-25 ENCOUNTER — Emergency Department (INDEPENDENT_AMBULATORY_CARE_PROVIDER_SITE_OTHER)
Admission: EM | Admit: 2013-12-25 | Discharge: 2013-12-25 | Disposition: A | Discharge status: Home / Self Care | Payer: Medicare Other | Source: Home / Self Care | Attending: Emergency Medicine | Admitting: Emergency Medicine

## 2013-12-25 DIAGNOSIS — T6391XA Toxic effect of contact with unspecified venomous animal, accidental (unintentional), initial encounter: Secondary | ICD-10-CM

## 2013-12-25 DIAGNOSIS — T63481A Toxic effect of venom of other arthropod, accidental (unintentional), initial encounter: Secondary | ICD-10-CM

## 2013-12-25 DIAGNOSIS — Y92009 Unspecified place in unspecified non-institutional (private) residence as the place of occurrence of the external cause: Secondary | ICD-10-CM

## 2013-12-25 HISTORY — DX: Disorder of kidney and ureter, unspecified: N28.9

## 2013-12-25 MED ORDER — TRIAMCINOLONE ACETONIDE 0.1 % EX CREA: 1.0000 "application " | TOPICAL_CREAM | Freq: Three times a day (TID) | CUTANEOUS | Status: DC

## 2013-12-25 MED ORDER — HYDROXYZINE HCL 25 MG PO TABS: 25.0000 mg | ORAL_TABLET | Freq: Three times a day (TID) | ORAL | Status: DC | PRN

## 2013-12-25 NOTE — ED Notes (Signed)
?   Yellow jacket sting to R ring finger while sitting on her porch 1 hr ago.  C/o pain and swelling to finger.  She removed her ring right away.  No SOB, rash or itching

## 2013-12-25 NOTE — Discharge Instructions (Signed)

## 2013-12-25 NOTE — ED Provider Notes (Signed)
  Chief Complaint    Chief Complaint  Patient presents with  . Insect Bite    History of Present Illness      Rachel Hobbs is a 73 year old female who was bitten by a yellow jacket on the right ring finger while at home this evening. Finger is swollen, somewhat painful, and mildly pruritic she able to move the finger normally and denies any numbness or tingling. She's had no difficulty breathing, wheezing, or coughing. No swelling of lips, tongue, or throat. No hives or generalized itching or rash.  Review of Systems   Other than as noted above, the patient denies any of the following symptoms: Systemic:  No fever or chills. ENT:  No nasal congestion, rhinorrhea, sore throat, swelling of lips, tongue or throat. Resp:  No cough, wheezing, or shortness of breath.  [place]    Past medical history, family history, social history, meds, and allergies were reviewed. She has no medication allergies. She's on a long list of meds including Norvasc, aspirin, Lipitor, carvedilol, Colace, Lasix, Glucotrol, a penicillin, Lantus, Imdur, Zaroxolyn, Calcitrol, Zyrtec, Flexeril, MiraLax, Senokot, and tramadol. Current medical problems include depression, hyperlipidemia, hypertension, diabetes, and chronic kidney disease.   Physical Exam     Vital signs:  BP 178/81  Pulse 71  Temp(Src) 98.1 F (36.7 C) (Oral)  Resp 20  SpO2 99% Gen:  Alert, oriented, in no distress. ENT:  Pharynx clear, no intraoral lesions, moist mucous membranes. Lungs:  Clear to auscultation. Skin:  The right ring finger is slightly swollen and minimally tender to palpation. All joints have full range of motion, sensation is intact to touch.     Assessment    The primary encounter diagnosis was Insect sting, accidental or unintentional, initial encounter. A diagnosis of Place of occurrence, home was also pertinent to this visit.  Plan     1.  Meds:  The following meds were prescribed:   New Prescriptions   HYDROXYZINE  (ATARAX/VISTARIL) 25 MG TABLET    Take 1 tablet (25 mg total) by mouth every 8 (eight) hours as needed for itching.   TRIAMCINOLONE CREAM (KENALOG) 0.1 %    Apply 1 application topically 3 (three) times daily.    2.  Patient Education/Counseling:  The patient was given appropriate handouts, self care instructions, and instructed in symptomatic relief.    3.  Follow up:  The patient was told to follow up here if no better in 3 to 4 days, or sooner if becoming worse in any way, and given some red flag symptoms such as worsening rash, fever, or difficulty breathing which would prompt immediate return.  Follow up here if necessary.      Harden Mo, MD 12/25/13 [phone]

## 2014-01-11 ENCOUNTER — Other Ambulatory Visit (HOSPITAL_COMMUNITY): Payer: Self-pay

## 2014-01-14 ENCOUNTER — Encounter (HOSPITAL_COMMUNITY)
Admission: RE | Admit: 2014-01-14 | Discharge: 2014-01-14 | Disposition: A | Discharge status: Home / Self Care | Payer: Medicare Other | Source: Ambulatory Visit | Attending: Nephrology | Admitting: Nephrology

## 2014-01-14 DIAGNOSIS — D638 Anemia in other chronic diseases classified elsewhere: Secondary | ICD-10-CM | POA: Diagnosis not present

## 2014-01-14 DIAGNOSIS — N184 Chronic kidney disease, stage 4 (severe): Secondary | ICD-10-CM | POA: Diagnosis not present

## 2014-01-14 LAB — RENAL FUNCTION PANEL
Albumin: 3.6 g/dL (ref 3.5–5.2)
Anion gap: 16 — ABNORMAL HIGH (ref 5–15)
BUN: 61 mg/dL — AB (ref 6–23)
CHLORIDE: 100 meq/L (ref 96–112)
CO2: 24 mEq/L (ref 19–32)
Calcium: 8.4 mg/dL (ref 8.4–10.5)
Creatinine, Ser: 5.51 mg/dL — ABNORMAL HIGH (ref 0.50–1.10)
GFR calc Af Amer: 8 mL/min — ABNORMAL LOW (ref >90)
GFR calc non Af Amer: 7 mL/min — ABNORMAL LOW (ref >90)
Glucose, Bld: 131 mg/dL — ABNORMAL HIGH (ref 70–99)
Phosphorus: 4.4 mg/dL (ref 2.3–4.6)
Potassium: 4.7 mEq/L (ref 3.7–5.3)
Sodium: 140 mEq/L (ref 137–147)

## 2014-01-14 LAB — IRON AND TIBC
IRON: 52 ug/dL (ref 42–135)
Saturation Ratios: 23 % (ref 20–55)
TIBC: 230 ug/dL — ABNORMAL LOW (ref 250–470)
UIBC: 178 ug/dL (ref 125–400)

## 2014-01-14 LAB — POCT HEMOGLOBIN-HEMACUE: Hemoglobin: 8.8 g/dL — ABNORMAL LOW (ref 12.0–15.0)

## 2014-01-14 LAB — FERRITIN: Ferritin: 294 ng/mL — ABNORMAL HIGH (ref 10–291)

## 2014-01-14 MED ORDER — SODIUM CHLORIDE 0.9 % IV SOLN
1020.0000 mg | Freq: Once | INTRAVENOUS | Status: AC
  Administered 2014-01-14: 1020 mg via INTRAVENOUS
  Filled 2014-01-14: qty 34

## 2014-01-14 MED ORDER — EPOETIN ALFA 20000 UNIT/ML IJ SOLN
INTRAMUSCULAR | Status: AC
  Filled 2014-01-14: qty 1

## 2014-01-14 MED ORDER — EPOETIN ALFA 20000 UNIT/ML IJ SOLN
20000.0000 [IU] | INTRAMUSCULAR | Status: DC
  Administered 2014-01-14: 20000 [IU] via SUBCUTANEOUS

## 2014-01-15 LAB — PTH, INTACT AND CALCIUM
Calcium, Total (PTH): 8.5 mg/dL (ref 8.4–10.5)
PTH: 186 pg/mL — ABNORMAL HIGH (ref 14–64)

## 2014-01-22 ENCOUNTER — Ambulatory Visit (INDEPENDENT_AMBULATORY_CARE_PROVIDER_SITE_OTHER): Payer: Medicare Other

## 2014-01-22 ENCOUNTER — Ambulatory Visit: Payer: Medicare Other

## 2014-01-22 DIAGNOSIS — M79606 Pain in leg, unspecified: Secondary | ICD-10-CM

## 2014-01-22 DIAGNOSIS — E1140 Type 2 diabetes mellitus with diabetic neuropathy, unspecified: Secondary | ICD-10-CM

## 2014-01-22 DIAGNOSIS — B351 Tinea unguium: Secondary | ICD-10-CM

## 2014-01-22 NOTE — Progress Notes (Signed)
   Subjective:    Patient ID: [name], female    DOB: 12-27-1940, 73 y.o.   MRN: [id number]  HPI  Pt presents for nail debridement  Review of Systems no new findings or systemic changes noted     Objective:   Physical Exam  Neurovascular status is intact pedal pulses are palpable DP +2/4 PT one over 4 mild plus one +2 edema noted bilateral skin temperature warm to cool turgor diminished no edema rubor pallor or varicosities-edema around the ankle but not to the toes nails thick brittle crumbly criptotic incurvated friable open wounds or ulcers no secondary infections. Mild digital contractures noted mild HAV deformity noted.      Assessment & Plan:  Assessment this time his diabetes history peripheral neuropathy and angiopathy. At this time dystrophic friable gratified thick mycotic nails 1 through 5 bilateral debrided return for future palliative care every 3 months as recommended contact us visiting changes or exacerbations in the interim  Harriet Masson DPM

## 2014-02-11 ENCOUNTER — Encounter (HOSPITAL_COMMUNITY): Payer: Medicare Other

## 2014-02-13 ENCOUNTER — Encounter (HOSPITAL_COMMUNITY)
Admission: RE | Admit: 2014-02-13 | Discharge: 2014-02-13 | Disposition: A | Discharge status: Home / Self Care | Payer: Medicare Other | Source: Ambulatory Visit | Attending: Nephrology | Admitting: Nephrology

## 2014-02-13 DIAGNOSIS — D631 Anemia in chronic kidney disease: Secondary | ICD-10-CM | POA: Diagnosis not present

## 2014-02-13 DIAGNOSIS — N184 Chronic kidney disease, stage 4 (severe): Secondary | ICD-10-CM | POA: Insufficient documentation

## 2014-02-13 LAB — IRON AND TIBC
IRON: 64 ug/dL (ref 42–135)
SATURATION RATIOS: 33 % (ref 20–55)
TIBC: 195 ug/dL — ABNORMAL LOW (ref 250–470)
UIBC: 131 ug/dL (ref 125–400)

## 2014-02-13 LAB — RENAL FUNCTION PANEL
Albumin: 3.6 g/dL (ref 3.5–5.2)
Anion gap: 18 — ABNORMAL HIGH (ref 5–15)
BUN: 66 mg/dL — AB (ref 6–23)
CO2: 24 mEq/L (ref 19–32)
CREATININE: 5.65 mg/dL — AB (ref 0.50–1.10)
Calcium: 8.8 mg/dL (ref 8.4–10.5)
Chloride: 100 mEq/L (ref 96–112)
GFR calc Af Amer: 8 mL/min — ABNORMAL LOW (ref >90)
GFR calc non Af Amer: 7 mL/min — ABNORMAL LOW (ref >90)
GLUCOSE: 169 mg/dL — AB (ref 70–99)
POTASSIUM: 4.3 meq/L (ref 3.7–5.3)
Phosphorus: 5 mg/dL — ABNORMAL HIGH (ref 2.3–4.6)
Sodium: 142 mEq/L (ref 137–147)

## 2014-02-13 LAB — FERRITIN: FERRITIN: 722 ng/mL — AB (ref 10–291)

## 2014-02-13 MED ORDER — EPOETIN ALFA 20000 UNIT/ML IJ SOLN
INTRAMUSCULAR | Status: AC
  Filled 2014-02-13: qty 1

## 2014-02-13 MED ORDER — EPOETIN ALFA 20000 UNIT/ML IJ SOLN
20000.0000 [IU] | INTRAMUSCULAR | Status: DC
  Administered 2014-02-13: 20000 [IU] via SUBCUTANEOUS

## 2014-02-14 LAB — PTH, INTACT AND CALCIUM
Calcium, Total (PTH): 9.1 mg/dL (ref 8.4–10.5)
PTH: 166 pg/mL — ABNORMAL HIGH (ref 14–64)

## 2014-02-14 LAB — POCT HEMOGLOBIN-HEMACUE: Hemoglobin: 8.9 g/dL — ABNORMAL LOW (ref 12.0–15.0)

## 2014-02-16 ENCOUNTER — Other Ambulatory Visit: Payer: Self-pay | Admitting: Family Medicine

## 2014-02-18 ENCOUNTER — Other Ambulatory Visit: Payer: Self-pay | Admitting: Family Medicine

## 2014-02-18 MED ORDER — INSULIN GLARGINE 100 UNIT/ML ~~LOC~~ SOLN: 15.0000 [IU] | Freq: Every day | SUBCUTANEOUS | Status: DC

## 2014-02-18 NOTE — Progress Notes (Signed)
Patient requested refill of lantus; however hasn't had DM visit for a two years. Unsure about current Lantus dose or compliance. Refilled Lantus at 15u qhs which is last prescribed amount and requested that she have PCP visit prior to additional refills.

## 2014-02-18 NOTE — Telephone Encounter (Signed)
Pt called to check the status if her request for her insulin has been sent to her pharmacy. jw

## 2014-02-19 NOTE — Telephone Encounter (Signed)
Appt made for 02/28/14 with pcp.Marland Kitchen jmh

## 2014-02-21 ENCOUNTER — Other Ambulatory Visit: Payer: Self-pay | Admitting: Family Medicine

## 2014-02-21 NOTE — Telephone Encounter (Signed)
appt made for 02/28/14.  Will forward to MD to see if he is willing to fill. Fleeger, Salome Spotted

## 2014-02-21 NOTE — Telephone Encounter (Signed)
Pt called because her Lantus is still not at the pharmacy. jw

## 2014-02-22 ENCOUNTER — Other Ambulatory Visit: Payer: Self-pay | Admitting: Family Medicine

## 2014-02-22 MED ORDER — INSULIN GLARGINE 100 UNIT/ML ~~LOC~~ SOLN: 30.0000 [IU] | Freq: Every day | SUBCUTANEOUS | Status: DC

## 2014-02-22 NOTE — Telephone Encounter (Signed)
Dr. Berkley Harvey called and stated he spoke with pt regarding Lantus refill.  Rx sent in to pharmacy.  Pt has an appt with Dr. Berkley Harvey on 02/28/14.  Derl Barrow, RN

## 2014-02-22 NOTE — Telephone Encounter (Signed)
Paged Provider via amion to check status of Lantus refill. Derl Barrow, RN

## 2014-02-22 NOTE — Telephone Encounter (Signed)
Pt called again and is wanting to know when the Lantus will be called in. jw

## 2014-02-28 ENCOUNTER — Encounter: Payer: Self-pay | Admitting: Family Medicine

## 2014-02-28 ENCOUNTER — Ambulatory Visit (INDEPENDENT_AMBULATORY_CARE_PROVIDER_SITE_OTHER): Payer: Medicare Other | Admitting: Family Medicine

## 2014-02-28 VITALS — BP 147/67 | HR 71 | Temp 98.6°F | Ht 62.0 in | Wt 254.0 lb

## 2014-02-28 DIAGNOSIS — K432 Incisional hernia without obstruction or gangrene: Secondary | ICD-10-CM

## 2014-02-28 DIAGNOSIS — K458 Other specified abdominal hernia without obstruction or gangrene: Secondary | ICD-10-CM

## 2014-02-28 DIAGNOSIS — E119 Type 2 diabetes mellitus without complications: Secondary | ICD-10-CM

## 2014-02-28 DIAGNOSIS — E118 Type 2 diabetes mellitus with unspecified complications: Secondary | ICD-10-CM

## 2014-02-28 DIAGNOSIS — R609 Edema, unspecified: Secondary | ICD-10-CM

## 2014-02-28 LAB — POCT GLYCOSYLATED HEMOGLOBIN (HGB A1C): Hemoglobin A1C: 7.3

## 2014-02-28 NOTE — Patient Instructions (Signed)
It was great seeing you today.   1. Start using compression stockings daily 2. Take Furosemide as directed to help with the swelling in your legs and blood pressure 3. I have referred you to surgery for your hernia 4. Continue Lantus 30units daily   Please bring all your medications to every doctors visit  Sign up for My Chart to have easy access to your labs results, and communication with your Primary care physician.  Next Appointment  Please make an appointment with Dr Berkley Harvey in 2 month   I look forward to talking with you again at our next visit. If you have any questions or concerns before then, please call the clinic at [phone].  Take Care,   Dr Phill Myron

## 2014-02-28 NOTE — Progress Notes (Signed)
Pt wants to wait till her next visit to get flu as she "is not feeling well today". Fleeger, Rachel Hobbs

## 2014-03-02 NOTE — Progress Notes (Signed)
  Patient name: LEETA BUTTERLY MRN [id number]  Date of birth: 12-18-1940  CC & HPI:  NOVALYNN OMORI is a 73 y.o. female presenting today for DM, Hernia, and LE swelling.   DIABETES   Symptoms of Hypoglycemia? no  Comorbid Symptoms: Denies Chest pain;  SOB;  Neuropathy:  Vision problems  Medication Compliance: yes   Medication Side Effects: no  Hernia - She reports having abdominal hernia for several years, and feels like it is getting bigger. She denies any pain or blood in stool. Has been seen by surgery before and would like to see them again to hear about her options  LE swelling - Hx of HF and CKD stage 5 - She reports only using compression sleeves occasionally due to being tight - she also only takes her fluid pills when she doesn't want to go out - denies any current ulcers or LE pain  - Per last cardiology note - "likely will need HD soon" has apt with [facility]  Medications & Allergies: Reviewed  Social History: Reviewed:   Objective Findings:  Vitals: BP 147/67 mmHg  Pulse 71  Temp(Src) 98.6 F (37 C) (Oral)  Ht 5\' 2"  (1.575 m)  Wt 254 lb (115.214 kg)  BMI 46.45 kg/m2  Gen: NAD; Obese CV: RRR w/o m/r/g, pulses +2 b/l Resp: CTAB w/ normal respiratory effort GI: Large left-side abdominal hernia; Nontender; BS + Lower Ext: 2+ edema to knees; Calves nontender   Assessment & Plan:   Please See Problem Focused Assessment & Plan

## 2014-03-02 NOTE — Assessment & Plan Note (Signed)
Likely due to HF and CKD stage 5 - admits to noncompliance with diuretic "when she goes out" and doesn't wear compression socks - Advised compliance and follow-up in 1 month

## 2014-03-02 NOTE — Assessment & Plan Note (Signed)
No signs of obstruction but she would like to follow-up with her Gen surgeon to discuss options - Referred to Gen surgery

## 2014-03-13 ENCOUNTER — Encounter (HOSPITAL_COMMUNITY)
Admission: RE | Admit: 2014-03-13 | Discharge: 2014-03-13 | Disposition: A | Discharge status: Home / Self Care | Payer: Medicare Other | Source: Ambulatory Visit | Attending: Nephrology | Admitting: Nephrology

## 2014-03-13 DIAGNOSIS — D631 Anemia in chronic kidney disease: Secondary | ICD-10-CM | POA: Insufficient documentation

## 2014-03-13 DIAGNOSIS — N184 Chronic kidney disease, stage 4 (severe): Secondary | ICD-10-CM | POA: Diagnosis not present

## 2014-03-13 LAB — FERRITIN: Ferritin: 403 ng/mL — ABNORMAL HIGH (ref 10–291)

## 2014-03-13 LAB — IRON AND TIBC
Iron: 58 ug/dL (ref 42–135)
SATURATION RATIOS: 28 % (ref 20–55)
TIBC: 204 ug/dL — ABNORMAL LOW (ref 250–470)
UIBC: 146 ug/dL (ref 125–400)

## 2014-03-13 LAB — RENAL FUNCTION PANEL
ALBUMIN: 3.6 g/dL (ref 3.5–5.2)
ANION GAP: 18 — AB (ref 5–15)
BUN: 73 mg/dL — AB (ref 6–23)
CO2: 23 meq/L (ref 19–32)
Calcium: 8.8 mg/dL (ref 8.4–10.5)
Chloride: 98 mEq/L (ref 96–112)
Creatinine, Ser: 5.44 mg/dL — ABNORMAL HIGH (ref 0.50–1.10)
GFR calc Af Amer: 8 mL/min — ABNORMAL LOW (ref >90)
GFR calc non Af Amer: 7 mL/min — ABNORMAL LOW (ref >90)
GLUCOSE: 157 mg/dL — AB (ref 70–99)
POTASSIUM: 4.4 meq/L (ref 3.7–5.3)
Phosphorus: 5.8 mg/dL — ABNORMAL HIGH (ref 2.3–4.6)
Sodium: 139 mEq/L (ref 137–147)

## 2014-03-13 MED ORDER — EPOETIN ALFA 20000 UNIT/ML IJ SOLN
INTRAMUSCULAR | Status: AC
  Administered 2014-03-13: 20000 [IU] via SUBCUTANEOUS
  Filled 2014-03-13: qty 1

## 2014-03-13 MED ORDER — EPOETIN ALFA 20000 UNIT/ML IJ SOLN: 20000.0000 [IU] | INTRAMUSCULAR | Status: DC

## 2014-03-16 LAB — POCT HEMOGLOBIN-HEMACUE: Hemoglobin: 8.8 g/dL — ABNORMAL LOW (ref 12.0–15.0)

## 2014-04-04 ENCOUNTER — Other Ambulatory Visit: Payer: Self-pay | Admitting: Family Medicine

## 2014-04-04 DIAGNOSIS — E118 Type 2 diabetes mellitus with unspecified complications: Secondary | ICD-10-CM

## 2014-04-04 NOTE — Telephone Encounter (Signed)
Needs needles to take her insulin Pleas advise

## 2014-04-05 MED ORDER — INSULIN GLARGINE 100 UNIT/ML ~~LOC~~ SOLN: 30.0000 [IU] | Freq: Every day | SUBCUTANEOUS | Status: DC

## 2014-04-09 ENCOUNTER — Other Ambulatory Visit (HOSPITAL_COMMUNITY): Payer: Self-pay | Admitting: *Deleted

## 2014-04-10 ENCOUNTER — Encounter (HOSPITAL_COMMUNITY)
Admission: RE | Admit: 2014-04-10 | Discharge: 2014-04-10 | Disposition: A | Discharge status: Home / Self Care | Payer: Medicare Other | Source: Ambulatory Visit | Attending: Nephrology | Admitting: Nephrology

## 2014-04-10 DIAGNOSIS — D631 Anemia in chronic kidney disease: Secondary | ICD-10-CM | POA: Diagnosis present

## 2014-04-10 DIAGNOSIS — N184 Chronic kidney disease, stage 4 (severe): Secondary | ICD-10-CM | POA: Diagnosis not present

## 2014-04-10 LAB — RENAL FUNCTION PANEL
ALBUMIN: 3.8 g/dL (ref 3.5–5.2)
Anion gap: 12 (ref 5–15)
BUN: 66 mg/dL — AB (ref 6–23)
CALCIUM: 8.9 mg/dL (ref 8.4–10.5)
CO2: 26 mmol/L (ref 19–32)
CREATININE: 5.66 mg/dL — AB (ref 0.50–1.10)
Chloride: 104 mEq/L (ref 96–112)
GFR calc Af Amer: 8 mL/min — ABNORMAL LOW (ref >90)
GFR calc non Af Amer: 7 mL/min — ABNORMAL LOW (ref >90)
Glucose, Bld: 200 mg/dL — ABNORMAL HIGH (ref 70–99)
PHOSPHORUS: 6 mg/dL — AB (ref 2.3–4.6)
Potassium: 4.4 mmol/L (ref 3.5–5.1)
Sodium: 142 mmol/L (ref 135–145)

## 2014-04-10 LAB — POCT HEMOGLOBIN-HEMACUE: HEMOGLOBIN: 9.5 g/dL — AB (ref 12.0–15.0)

## 2014-04-10 LAB — IRON AND TIBC
Iron: 63 ug/dL (ref 42–135)
Saturation Ratios: 31 % (ref 20–55)
TIBC: 206 ug/dL — ABNORMAL LOW (ref 250–470)
UIBC: 143 ug/dL (ref 125–400)

## 2014-04-10 MED ORDER — EPOETIN ALFA 20000 UNIT/ML IJ SOLN
20000.0000 [IU] | INTRAMUSCULAR | Status: DC
  Administered 2014-04-10: 20000 [IU] via SUBCUTANEOUS

## 2014-04-10 MED ORDER — EPOETIN ALFA 20000 UNIT/ML IJ SOLN
INTRAMUSCULAR | Status: AC
  Filled 2014-04-10: qty 1

## 2014-04-10 MED ORDER — SODIUM CHLORIDE 0.9 % IV SOLN
1020.0000 mg | Freq: Once | INTRAVENOUS | Status: AC
  Administered 2014-04-10: 1020 mg via INTRAVENOUS
  Filled 2014-04-10: qty 34

## 2014-04-11 LAB — PTH, INTACT AND CALCIUM
CALCIUM TOTAL (PTH): 8.7 mg/dL (ref 8.4–10.5)
PTH: 255 pg/mL — ABNORMAL HIGH (ref 14–64)

## 2014-04-11 LAB — FERRITIN: Ferritin: 490 ng/mL — ABNORMAL HIGH (ref 10–291)

## 2014-04-23 ENCOUNTER — Ambulatory Visit (INDEPENDENT_AMBULATORY_CARE_PROVIDER_SITE_OTHER): Payer: Medicare Other

## 2014-04-23 ENCOUNTER — Other Ambulatory Visit: Payer: Medicare Other

## 2014-04-23 DIAGNOSIS — B351 Tinea unguium: Secondary | ICD-10-CM

## 2014-04-23 DIAGNOSIS — M79606 Pain in leg, unspecified: Secondary | ICD-10-CM

## 2014-04-23 NOTE — Progress Notes (Signed)
   Subjective:    Patient ID: Rachel Hobbs, female    DOB: [birth year], 74 y.o.   MRN: [id number]  HPI Comments: "Cut my toenails"      Review of Systems no new findings or systemic changes noted     Objective:   Physical Exam Neurovascular status is intact pedal pulses DP +2 PT plus one over 4 Refill time 4 seconds all digits epicritic and proprioceptive sensations diminished on Semmes Weinstein the forefoot digits patient has some paresthesias having some pain lateral left midfoot does have some arthrosis and Lisfranc's and MTP joints recommended some Tylenol as needed for pain if she continues to have difficulty in swelling and pain may be candidate for x-ray evaluation I do not feel is a fracture on palpation or evaluation this time 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 Attaining good stable shoe which she has been for the most part however may have gone with soft shoes are slippers around the house take some arthropathy pain nails brittle [place] friable dystrophic are debrided 1 through 5 bilateral this time.       Assessment & Plan:  Assessment diabetes with history peripheral neuropathy and angiopathy debridement of dystrophic friable gratified mycotic nails 1 through 5 bilateral return for mycotic and diabetic foot nail care in 3 months as recommended however if she continues to have pain in the left foot may come in earlier for x-ray evaluation.  Harriet Masson DPM

## 2014-04-23 NOTE — Patient Instructions (Signed)
Diabetes and Foot Care Diabetes may cause you to have problems because of poor blood supply (circulation) to your feet and legs. This may cause the skin on your feet to become thinner, break easier, and heal more slowly. Your skin may become dry, and the skin may peel and crack. You may also have nerve damage in your legs and feet causing decreased feeling in them. You may not notice minor injuries to your feet that could lead to infections or more serious problems. Taking care of your feet is one of the most important things you can do for yourself.  HOME CARE INSTRUCTIONS  Wear shoes at all times, even in the house. Do not go barefoot. Bare feet are easily injured.  Check your feet daily for blisters, cuts, and redness. If you cannot see the bottom of your feet, use a mirror or ask someone for help.  Wash your feet with warm water (do not use hot water) and mild soap. Then pat your feet and the areas between your toes until they are completely dry. Do not soak your feet as this can dry your skin.  Apply a moisturizing lotion or petroleum jelly (that does not contain alcohol and is unscented) to the skin on your feet and to dry, brittle toenails. Do not apply lotion between your toes.  Trim your toenails straight across. Do not dig under them or around the cuticle. File the edges of your nails with an emery board or nail file.  Do not cut corns or calluses or try to remove them with medicine.  Wear clean socks or stockings every day. Make sure they are not too tight. Do not wear knee-high stockings since they may decrease blood flow to your legs.  Wear shoes that fit properly and have enough cushioning. To break in new shoes, wear them for just a few hours a day. This prevents you from injuring your feet. Always look in your shoes before you put them on to be sure there are no objects inside.  Do not cross your legs. This may decrease the blood flow to your feet.  If you find a minor scrape,  cut, or break in the skin on your feet, keep it and the skin around it clean and dry. These areas may be cleansed with mild soap and water. Do not cleanse the area with peroxide, alcohol, or iodine.  When you remove an adhesive bandage, be sure not to damage the skin around it.  If you have a wound, look at it several times a day to make sure it is healing.  Do not use heating pads or hot water bottles. They may burn your skin. If you have lost feeling in your feet or legs, you may not know it is happening until it is too late.  Make sure your health care provider performs a complete foot exam at least annually or more often if you have foot problems. Report any cuts, sores, or bruises to your health care provider immediately. SEEK MEDICAL CARE IF:   You have an injury that is not healing.  You have cuts or breaks in the skin.  You have an ingrown nail.  You notice redness on your legs or feet.  You feel burning or tingling in your legs or feet.  You have pain or cramps in your legs and feet.  Your legs or feet are numb.  Your feet always feel cold. SEEK IMMEDIATE MEDICAL CARE IF:   There is increasing redness,   swelling, or pain in or around a wound.  There is a red line that goes up your leg.  Pus is coming from a wound.  You develop a fever or as directed by your health care provider.  You notice a bad smell coming from an ulcer or wound. Document Released: 04/02/2000 Document Revised: 12/06/2012 Document Reviewed: 09/12/2012 Exit[employer] Patient Information 2015 [employer]. This information is not intended to replace advice given to you by your health care provider. Make sure you discuss any questions you have with your health care provider.  

## 2014-04-25 ENCOUNTER — Emergency Department (HOSPITAL_COMMUNITY): Payer: Medicare Other

## 2014-04-25 ENCOUNTER — Encounter (HOSPITAL_COMMUNITY): Payer: Self-pay | Admitting: *Deleted

## 2014-04-25 ENCOUNTER — Emergency Department (HOSPITAL_COMMUNITY)
Admission: EM | Admit: 2014-04-25 | Discharge: 2014-04-25 | Disposition: A | Discharge status: Home / Self Care | Payer: Medicare Other | Attending: Emergency Medicine | Admitting: Emergency Medicine

## 2014-04-25 DIAGNOSIS — Z87448 Personal history of other diseases of urinary system: Secondary | ICD-10-CM | POA: Insufficient documentation

## 2014-04-25 DIAGNOSIS — Z79899 Other long term (current) drug therapy: Secondary | ICD-10-CM | POA: Diagnosis not present

## 2014-04-25 DIAGNOSIS — M79609 Pain in unspecified limb: Secondary | ICD-10-CM

## 2014-04-25 DIAGNOSIS — F329 Major depressive disorder, single episode, unspecified: Secondary | ICD-10-CM | POA: Diagnosis not present

## 2014-04-25 DIAGNOSIS — I10 Essential (primary) hypertension: Secondary | ICD-10-CM | POA: Diagnosis not present

## 2014-04-25 DIAGNOSIS — E785 Hyperlipidemia, unspecified: Secondary | ICD-10-CM | POA: Insufficient documentation

## 2014-04-25 DIAGNOSIS — Z8719 Personal history of other diseases of the digestive system: Secondary | ICD-10-CM | POA: Diagnosis not present

## 2014-04-25 DIAGNOSIS — M79672 Pain in left foot: Secondary | ICD-10-CM | POA: Diagnosis not present

## 2014-04-25 DIAGNOSIS — Z7982 Long term (current) use of aspirin: Secondary | ICD-10-CM | POA: Insufficient documentation

## 2014-04-25 DIAGNOSIS — E119 Type 2 diabetes mellitus without complications: Secondary | ICD-10-CM | POA: Diagnosis not present

## 2014-04-25 DIAGNOSIS — Z794 Long term (current) use of insulin: Secondary | ICD-10-CM | POA: Insufficient documentation

## 2014-04-25 DIAGNOSIS — M1990 Unspecified osteoarthritis, unspecified site: Secondary | ICD-10-CM | POA: Diagnosis not present

## 2014-04-25 DIAGNOSIS — R609 Edema, unspecified: Secondary | ICD-10-CM

## 2014-04-25 MED ORDER — HYDROCODONE-ACETAMINOPHEN 5-325 MG PO TABS: 1.0000 | ORAL_TABLET | ORAL | Status: DC | PRN

## 2014-04-25 NOTE — ED Provider Notes (Signed)
CSN: [id number]     Arrival date & time 04/25/14  1630 History   First MD Initiated Contact with Patient 04/25/14 1820     This chart was scribed for non-physician practitioner, Clayton Bibles PA-C  working with Virgel Manifold, MD by Forrestine Him, ED Scribe. This patient was seen in room TR11C/TR11C and the patient's care was started at 6:31 PM.   Chief Complaint  Patient presents with  . Foot Pain   The history is provided by the patient. No language interpreter was used.    HPI Comments: Rachel Hobbs is a 74 y.o. female with a PMHx of hyperlipidemia, renal insufficiency, HTN, gout, and DM who presents to the Emergency Department complaining of constant, moderate L foot pain x couple of days after waking from sleep. Pain is described as "sore". No recent injury or trauma. Pt has tried "green alcohol" to the area without any improvement for symptoms. No recent fever, chills, nausea, vomiting, cough, sore throat, worsening SOB, leg swelling, CP, or abdominal pain. She denies any weakness, numbness, or paresthesia. No history of blood clots. No known allergies to medications. Denies recent immobilization.  Denies any injury or fall.  Past Medical History  Diagnosis Date  . Depression   . Hyperlipidemia   . Hypertension   . Diabetes mellitus   . Hernia   . Renal disorder     renal insufficiency   Past Surgical History  Procedure Laterality Date  . Abdominal hysterectomy      in the 70's  . Hernia repair      umbilical in the 39'Q   Family History  Problem Relation Age of Onset  . Kidney disease Mother   . Hypertension Mother   . COPD Father     smoke  . Cancer Father     Lung  . Hypertension Father   . Cancer Brother     prostate  . Kidney disease Sister    History  Substance Use Topics  . Smoking status: Never Smoker   . Smokeless tobacco: Never Used  . Alcohol Use: No   OB History    No data available     Review of Systems  Constitutional: Negative for fever and  chills.  HENT: Negative for congestion.   Respiratory: Negative for cough and shortness of breath.   Cardiovascular: Negative for chest pain.  Musculoskeletal: Positive for arthralgias.  Neurological: Negative for weakness and numbness.  All other systems reviewed and are negative.     Allergies  Review of patient's allergies indicates no known allergies.  Home Medications   Prior to Admission medications   Medication Sig Start Date End Date Taking? Authorizing Provider  acetaminophen (TYLENOL) 500 MG tablet Take 1,000 mg by mouth every 6 (six) hours as needed for pain.    Historical Provider, MD  amLODipine (NORVASC) 10 MG tablet Take 1 tablet (10 mg total) by mouth daily. 12/22/11   Cletus Gash, MD  aspirin 81 MG tablet Take 81 mg by mouth daily.      Historical Provider, MD  atorvastatin (LIPITOR) 40 MG tablet TAKE ONE TABLET BY MOUTH EVERY DAY FOR CHOLESTEROL 12/09/13   Olam Idler, MD  Blood Glucose Monitoring Suppl (BLOOD GLUCOSE METER) kit Use as instructed 05/09/13   Olam Idler, MD  calcitRIOL (ROCALTROL) 0.25 MCG capsule TAKE ONE CAPSULE BY MOUTH DAILY 06/12/13   Olam Idler, MD  carvedilol (COREG) 12.5 MG tablet Take 12.5 mg by mouth 2 (two) times daily with  a meal. 12/22/11   Cletus Gash, MD  cetirizine (ZYRTEC) 10 MG tablet Take 1 tablet (10 mg total) by mouth daily. 06/22/12   Carolin Guernsey, MD  cyclobenzaprine (FLEXERIL) 5 MG tablet Take 1 tablet (5 mg total) by mouth 3 (three) times daily as needed for muscle spasms. 10/29/13   Nobie Putnam, DO  docusate sodium (COLACE) 100 MG capsule Take 100 mg by mouth at bedtime.     Historical Provider, MD  furosemide (LASIX) 80 MG tablet TAKE 2 TABLETS BY MOUTH EVERY MORNING AND 1 TABLET EVERY AFTERNOON    Jolaine Artist, MD  glipiZIDE (GLUCOTROL) 10 MG tablet Take 1 tablet (10 mg total) by mouth 2 (two) times daily before a meal. 12/22/11   Cletus Gash, MD  glucose blood (ONE TOUCH ULTRA TEST)  test strip Once daily testing and prn for symptoms of hypoglycemia 05/09/13   Olam Idler, MD  glycerin adult (GLYCERIN ADULT) 2 G SUPP Place 1 suppository rectally once. 10/29/13   Nobie Putnam, DO  hydrALAZINE (APRESOLINE) 50 MG tablet Take 1.5 tablets (75 mg total) by mouth 3 (three) times daily. 02/19/13   Jolaine Artist, MD  hydrOXYzine (ATARAX/VISTARIL) 25 MG tablet Take 1 tablet (25 mg total) by mouth every 8 (eight) hours as needed for itching. 12/25/13   Harden Mo, MD  insulin glargine (LANTUS) 100 UNIT/ML injection Inject 0.3 mLs (30 Units total) into the skin at bedtime. 04/05/14   Olam Idler, MD  isosorbide mononitrate (IMDUR) 30 MG 24 hr tablet Take 30 mg by mouth daily.    Historical Provider, MD  Lancets Glory Rosebush ULTRASOFT) lancets Once daily testing plus prn for hypoglycemia 05/09/13   Olam Idler, MD  metolazone (ZAROXOLYN) 2.5 MG tablet TAKE 1 TABLET BY MOUTH ONCE A WEEK AND AS NEEDED    Jolaine Artist, MD  Multiple Vitamins-Minerals (ONE DAILY WOMENS) TABS Take by mouth daily.      Historical Provider, MD  polyethylene glycol powder (GLYCOLAX/MIRALAX) powder Take 17 g by mouth daily. 08/13/13   Patrecia Pour, MD  senna (SENOKOT) 8.6 MG TABS tablet Take 1-2 tablets (8.6-17.2 mg total) by mouth daily as needed for mild constipation. 10/29/13   Nobie Putnam, DO  traMADol (ULTRAM) 50 MG tablet Take 1 tablet (50 mg total) by mouth every 12 (twelve) hours as needed. 03/23/13   Leone Brand, MD  triamcinolone cream (KENALOG) 0.1 % Apply 1 application topically 3 (three) times daily. 12/25/13   Harden Mo, MD   Triage Vitals: BP 161/62 mmHg  Pulse 71  Temp(Src) 98.1 F (36.7 C) (Oral)  Resp 20  SpO2 99%  Physical Exam  Constitutional: She appears well-developed and well-nourished. No distress.  HENT:  Head: Normocephalic and atraumatic.  Neck: Neck supple.  Cardiovascular: Intact distal pulses.   Pulmonary/Chest: Effort normal.   Musculoskeletal: She exhibits edema (Lower extremity edema, L>R).  Diffusely tender over L foot.  She has increased swelling over her left latera ankle posteriorly.  Left calf is larger than right.  There are chronic skin changes to bilateral lower legs.  No erythema or warmth associated with any joints.  No noted wounds or breaks in the skin.  Distal sensation and pulses intact.   Neurological: She is alert.  Skin: She is not diaphoretic.  Nursing note and vitals reviewed.   ED Course  Procedures (including critical care time)  DIAGNOSTIC STUDIES: Oxygen Saturation is 99% on RA, Normal by my interpretation.  COORDINATION OF CARE: 6:31 PM-Discussed treatment plan with pt at bedside and pt agreed to plan.     Labs Review Labs Reviewed - No data to display  Imaging Review Dg Foot Complete Left  04/25/2014   CLINICAL DATA:  Left foot pain for 2 days.  No known injury.  EXAM: LEFT FOOT - COMPLETE 3+ VIEW  COMPARISON:  None.  FINDINGS: The bones are under mineralized. There is moderate hallux valgus with associated osteoarthritis of the first metatarsal phalangeal joint. Joint space narrowing and spurring in the midfoot. Plantar calcaneal spur and Achilles tendon enthesophyte. No osseous erosions. No periosteal reaction. There are dense vascular calcifications. Question mild diffuse soft tissue edema.  IMPRESSION: 1. Hallux valgus and osteoarthritis of the first metatarsal phalangeal joint. Mild midfoot osteoarthritis. 2. Plantar calcaneal spur and Achilles tendon enthesophyte. 3. No acute bony abnormality.   Electronically Signed   By: Jeb Levering M.D.   On: 04/25/2014 17:16     EKG Interpretation None       6:36 PM Discussed pt with Dr Wilson Singer who will also see the patient.   8:03 PM Pt is negative for DVT  MDM   Final diagnoses:  Left foot pain  Arthritis    Afebrile, nontoxic patient with left lateral foot and posterior heel pain without injury. There is no erythema or  warmth.  Doubt septic joint or cellulitis.  There is no injury.  Pt does have arthritis throughout foot on xray and this may have brought on her pain with the recent cold snap.  Pt also seen by Dr Wilson Singer who recommended treating her pain with PCP follow up.  DVT study is negative.   D/C home with ace wrap, norco, close PCP follow up.  Pt and family member advised to be cautious using narcotic pain medication as it may increase risk of falling.  Discussed result, findings, treatment, and follow up  with patient.  Pt given return precautions.  Pt verbalizes understanding and agrees with plan.       I personally performed the services described in this documentation, which was scribed in my presence. The recorded information has been reviewed and is accurate.    Clayton Bibles, PA-C 04/25/14 2121  Virgel Manifold, MD 04/27/14 [phone]

## 2014-04-25 NOTE — Discharge Instructions (Signed)

## 2014-04-25 NOTE — ED Notes (Signed)
Pt c/o left foot pain starting yesterday. Pt denies any recent falls or injury to left foot. Pt states she woke up with the pain this morning.

## 2014-04-25 NOTE — ED Notes (Signed)
Declined W/C at D/C and was escorted to lobby by RN. 

## 2014-04-25 NOTE — Progress Notes (Signed)
Left lower extremity venous duplex completed.  Left:  No evidence of DVT, superficial thrombosis, or Baker's cyst.  Right:  Negative for DVT in the common femoral vein.  

## 2014-05-02 ENCOUNTER — Telehealth: Payer: Self-pay | Admitting: *Deleted

## 2014-05-02 NOTE — Telephone Encounter (Signed)
Received message from [facility].  They received a Rx for Lantus vials.  The pt is requesting a Rx for Lantus SoloStar Pen.  If consider changing please send in new Rx for the SoloStar. Derl Barrow, RN

## 2014-05-03 ENCOUNTER — Telehealth: Payer: Self-pay | Admitting: Family Medicine

## 2014-05-03 DIAGNOSIS — E118 Type 2 diabetes mellitus with unspecified complications: Secondary | ICD-10-CM

## 2014-05-03 MED ORDER — "NEEDLE (DISP) 30G X 1"" MISC": 1.0000 | Freq: Every day | Status: DC

## 2014-05-03 MED ORDER — INSULIN GLARGINE 100 UNIT/ML SOLOSTAR PEN: 30.0000 [IU] | PEN_INJECTOR | SUBCUTANEOUS | Status: DC

## 2014-05-03 NOTE — Telephone Encounter (Signed)
Patient requested Lantus solostar pens. Sent in Rx for Pens and BD needles.

## 2014-05-03 NOTE — Telephone Encounter (Signed)
LM for patient "prescriptions were sent to pharmacy." [facility]

## 2014-05-09 ENCOUNTER — Encounter (HOSPITAL_COMMUNITY)
Admission: RE | Admit: 2014-05-09 | Discharge: 2014-05-09 | Disposition: A | Discharge status: Home / Self Care | Payer: Medicare Other | Source: Ambulatory Visit | Attending: Nephrology | Admitting: Nephrology

## 2014-05-09 DIAGNOSIS — D631 Anemia in chronic kidney disease: Secondary | ICD-10-CM | POA: Insufficient documentation

## 2014-05-09 DIAGNOSIS — N184 Chronic kidney disease, stage 4 (severe): Secondary | ICD-10-CM | POA: Diagnosis not present

## 2014-05-09 LAB — IRON AND TIBC
Iron: 81 ug/dL (ref 42–145)
SATURATION RATIOS: 42 % (ref 20–55)
TIBC: 194 ug/dL — ABNORMAL LOW (ref 250–470)
UIBC: 113 ug/dL — AB (ref 125–400)

## 2014-05-09 LAB — RENAL FUNCTION PANEL
Albumin: 3.5 g/dL (ref 3.5–5.2)
Anion gap: 13 (ref 5–15)
BUN: 65 mg/dL — ABNORMAL HIGH (ref 6–23)
CO2: 25 mmol/L (ref 19–32)
Calcium: 9.1 mg/dL (ref 8.4–10.5)
Chloride: 103 mEq/L (ref 96–112)
Creatinine, Ser: 5.67 mg/dL — ABNORMAL HIGH (ref 0.50–1.10)
GFR calc non Af Amer: 7 mL/min — ABNORMAL LOW (ref >90)
GFR, EST AFRICAN AMERICAN: 8 mL/min — AB (ref >90)
GLUCOSE: 153 mg/dL — AB (ref 70–99)
POTASSIUM: 4.4 mmol/L (ref 3.5–5.1)
Phosphorus: 5.5 mg/dL — ABNORMAL HIGH (ref 2.3–4.6)
Sodium: 141 mmol/L (ref 135–145)

## 2014-05-09 LAB — FERRITIN: FERRITIN: 601 ng/mL — AB (ref 10–291)

## 2014-05-09 MED ORDER — EPOETIN ALFA 20000 UNIT/ML IJ SOLN
20000.0000 [IU] | INTRAMUSCULAR | Status: DC
  Administered 2014-05-09: 20000 [IU] via SUBCUTANEOUS

## 2014-05-09 MED ORDER — EPOETIN ALFA 20000 UNIT/ML IJ SOLN
INTRAMUSCULAR | Status: AC
  Filled 2014-05-09: qty 1

## 2014-05-10 LAB — POCT HEMOGLOBIN-HEMACUE: Hemoglobin: 10.1 g/dL — ABNORMAL LOW (ref 12.0–15.0)

## 2014-05-10 LAB — PTH, INTACT AND CALCIUM
Calcium, Total (PTH): 9.1 mg/dL (ref 8.7–10.3)
PTH: 57 pg/mL (ref 15–65)

## 2014-05-22 ENCOUNTER — Encounter: Payer: Self-pay | Admitting: Family Medicine

## 2014-05-22 ENCOUNTER — Ambulatory Visit (INDEPENDENT_AMBULATORY_CARE_PROVIDER_SITE_OTHER): Payer: Medicare Other | Admitting: Family Medicine

## 2014-05-22 VITALS — BP 119/80 | HR 80 | Temp 98.1°F | Ht 62.0 in | Wt 250.8 lb

## 2014-05-22 DIAGNOSIS — E118 Type 2 diabetes mellitus with unspecified complications: Secondary | ICD-10-CM

## 2014-05-22 DIAGNOSIS — K458 Other specified abdominal hernia without obstruction or gangrene: Secondary | ICD-10-CM

## 2014-05-22 DIAGNOSIS — Z23 Encounter for immunization: Secondary | ICD-10-CM

## 2014-05-22 LAB — POCT GLYCOSYLATED HEMOGLOBIN (HGB A1C): Hemoglobin A1C: 6.5

## 2014-05-22 NOTE — Patient Instructions (Addendum)
It was great seeing you today.   1. I will check your a1c today and call you with the results. Continue taking Lantus 30units in the morning and Glipizide.  2. Call your cardiologist [phone] and schedule appointment as soon as possible.  3. Check with you kidney and heart doctors about the hernia surgery you are considering. Call you insurance company to see if they would pay for the surgery.    Please bring all your medications to every doctors visit  Sign up for My Chart to have easy access to your labs results, and communication with your Primary care physician.  Next Appointment  Please call to make an appointment with Dr Berkley Harvey in 1 month for wellness visit   I look forward to talking with you again at our next visit. If you have any questions or concerns before then, please call the clinic at [phone].  Take Care,   Dr Phill Myron

## 2014-05-23 ENCOUNTER — Encounter: Payer: Self-pay | Admitting: Family Medicine

## 2014-05-23 NOTE — Progress Notes (Signed)
  Patient name: Rachel Hobbs MRN [id number]  Date of birth: 1940/10/24  CC & HPI:  Rachel Hobbs is a 74 y.o. female presenting today for DM, CHF, and hernia.   DIABETES   Blood Sugar Ranges: 60 -200  Symptoms of Hypoglycemia? Yes 1-2 x month  Comorbid Symptoms: No Chest pain; SOB; Neuropathy; Vision problems  Medication Compliance: yes, Takes insulin every night but varies units 15-30 depending on how her BG is at that moment   Medication Side Effects: denies   Counseling  Diet pattern: Carb mod  Exercise: no  Smoking status: no    CHF - Report compliance with her Cardiac meds - has been back to cardiologist since 09/2013; note reports needing to f/u ~ 01/2014 - Denies CP or worsening SOB; Does endorse LE swelling and compliance with diuretics with good response - Not wearing compression stockings  Hernia - Met with Surgeon in [place] who told her "he couldn't do the surgery, she would have to go to [place] or [facility] [facility]" - She continue to denies abdominal pain - "It's just uncomfortable" - Denies N/V/D or blood in stool   ROS: See HPI   Medications & Allergies: Reviewed   Objective Findings:  Vitals: BP 119/80 mmHg  Pulse 80  Temp(Src) 98.1 F (36.7 C) (Oral)  Ht _0  (1.575 m)  Wt 250 lb 12.8 oz (113.762 kg)  BMI 45.86 kg/m2  Gen: NAD CV: RRR w/o m/r/g, pulses +2 b/l Resp: CTAB w/ normal respiratory effort GI: No skin changes; Large abdominal wall hernia w/o tenderness; BS + x 4 quads; No guarding or rebounding LE: 2+ edema b/l with wood legs  Assessment & Plan:   Please See Problem Focused Assessment & Plan

## 2014-05-23 NOTE — Assessment & Plan Note (Signed)
No signs of obstruction. [facility] unable/willing to operate - consider referral to [facility] or [place] - Asked her to discuss with her Nephrologist and Cardiologist before referral is made to discuss risk - Asked her to discuss with her insurance if they would cover an elective procedure

## 2014-05-23 NOTE — Assessment & Plan Note (Addendum)
A1c remains well controlled. Occasional hypoglycemia. Take Lantus 15 to 30 units at night depending on her sugars at that moment - Recommend consistent Lantus dose: 25 units; take in the morning with breakfast - Check fasting BG daily and f/u in 2-3 weeks to reassess hypoglycemia  - Continue glipizide

## 2014-05-23 NOTE — Assessment & Plan Note (Signed)
Compliant with medications but not wearing compression stockings. LE edema. No signs of acute exacerbation  - Is pass due for f/u in HF clinic; referred back to [facility] - Encourage compression stocking use and low salt diet

## 2014-06-03 ENCOUNTER — Other Ambulatory Visit: Payer: Self-pay | Admitting: Family Medicine

## 2014-06-03 DIAGNOSIS — E118 Type 2 diabetes mellitus with unspecified complications: Secondary | ICD-10-CM

## 2014-06-06 ENCOUNTER — Encounter (HOSPITAL_COMMUNITY)
Admission: RE | Admit: 2014-06-06 | Discharge: 2014-06-06 | Disposition: A | Discharge status: Home / Self Care | Payer: Medicare Other | Source: Ambulatory Visit | Attending: Nephrology | Admitting: Nephrology

## 2014-06-06 DIAGNOSIS — N184 Chronic kidney disease, stage 4 (severe): Secondary | ICD-10-CM | POA: Insufficient documentation

## 2014-06-06 DIAGNOSIS — D631 Anemia in chronic kidney disease: Secondary | ICD-10-CM | POA: Insufficient documentation

## 2014-06-06 LAB — RENAL FUNCTION PANEL
ALBUMIN: 3.5 g/dL (ref 3.5–5.2)
ANION GAP: 12 (ref 5–15)
BUN: 76 mg/dL — ABNORMAL HIGH (ref 6–23)
CO2: 24 mmol/L (ref 19–32)
Calcium: 8.9 mg/dL (ref 8.4–10.5)
Chloride: 102 mmol/L (ref 96–112)
Creatinine, Ser: 5.79 mg/dL — ABNORMAL HIGH (ref 0.50–1.10)
GFR calc non Af Amer: 7 mL/min — ABNORMAL LOW (ref >90)
GFR, EST AFRICAN AMERICAN: 8 mL/min — AB (ref >90)
Glucose, Bld: 124 mg/dL — ABNORMAL HIGH (ref 70–99)
PHOSPHORUS: 5.4 mg/dL — AB (ref 2.3–4.6)
Potassium: 4.4 mmol/L (ref 3.5–5.1)
Sodium: 138 mmol/L (ref 135–145)

## 2014-06-06 LAB — FERRITIN: FERRITIN: 695 ng/mL — AB (ref 10–291)

## 2014-06-06 LAB — IRON AND TIBC
IRON: 76 ug/dL (ref 42–145)
SATURATION RATIOS: 39 % (ref 20–55)
TIBC: 195 ug/dL — ABNORMAL LOW (ref 250–470)
UIBC: 119 ug/dL — ABNORMAL LOW (ref 125–400)

## 2014-06-06 LAB — POCT HEMOGLOBIN-HEMACUE: Hemoglobin: 10.4 g/dL — ABNORMAL LOW (ref 12.0–15.0)

## 2014-06-06 MED ORDER — EPOETIN ALFA 20000 UNIT/ML IJ SOLN
20000.0000 [IU] | INTRAMUSCULAR | Status: DC
  Administered 2014-06-06: 20000 [IU] via SUBCUTANEOUS

## 2014-06-07 LAB — PTH, INTACT AND CALCIUM
CALCIUM TOTAL (PTH): 8.9 mg/dL (ref 8.7–10.3)
PTH: 76 pg/mL — ABNORMAL HIGH (ref 15–65)

## 2014-06-07 MED ORDER — EPOETIN ALFA 20000 UNIT/ML IJ SOLN
INTRAMUSCULAR | Status: AC
  Filled 2014-06-07: qty 1

## 2014-07-01 ENCOUNTER — Encounter (HOSPITAL_COMMUNITY): Payer: Self-pay

## 2014-07-01 ENCOUNTER — Ambulatory Visit (HOSPITAL_COMMUNITY)
Admission: RE | Admit: 2014-07-01 | Discharge: 2014-07-01 | Disposition: A | Discharge status: Home / Self Care | Payer: Medicare Other | Source: Ambulatory Visit | Attending: Cardiology | Admitting: Cardiology

## 2014-07-01 VITALS — BP 152/64 | HR 73 | Wt 257.8 lb

## 2014-07-01 DIAGNOSIS — I5032 Chronic diastolic (congestive) heart failure: Secondary | ICD-10-CM | POA: Insufficient documentation

## 2014-07-01 DIAGNOSIS — Z794 Long term (current) use of insulin: Secondary | ICD-10-CM | POA: Insufficient documentation

## 2014-07-01 DIAGNOSIS — N185 Chronic kidney disease, stage 5: Secondary | ICD-10-CM | POA: Diagnosis not present

## 2014-07-01 DIAGNOSIS — Z7982 Long term (current) use of aspirin: Secondary | ICD-10-CM | POA: Insufficient documentation

## 2014-07-01 DIAGNOSIS — E785 Hyperlipidemia, unspecified: Secondary | ICD-10-CM | POA: Insufficient documentation

## 2014-07-01 DIAGNOSIS — I120 Hypertensive chronic kidney disease with stage 5 chronic kidney disease or end stage renal disease: Secondary | ICD-10-CM | POA: Insufficient documentation

## 2014-07-01 DIAGNOSIS — K469 Unspecified abdominal hernia without obstruction or gangrene: Secondary | ICD-10-CM | POA: Diagnosis not present

## 2014-07-01 DIAGNOSIS — F329 Major depressive disorder, single episode, unspecified: Secondary | ICD-10-CM | POA: Insufficient documentation

## 2014-07-01 DIAGNOSIS — I10 Essential (primary) hypertension: Secondary | ICD-10-CM | POA: Diagnosis not present

## 2014-07-01 DIAGNOSIS — E119 Type 2 diabetes mellitus without complications: Secondary | ICD-10-CM | POA: Insufficient documentation

## 2014-07-01 DIAGNOSIS — Z79899 Other long term (current) drug therapy: Secondary | ICD-10-CM | POA: Insufficient documentation

## 2014-07-01 MED ORDER — METOLAZONE 2.5 MG PO TABS: 2.5000 mg | ORAL_TABLET | ORAL | Status: DC

## 2014-07-01 NOTE — Progress Notes (Signed)
Patient ID: Rachel Hobbs, female   DOB: 27-May-1940, 74 y.o.   MRN: [id number]  Nephrologist: Dr. Moshe Cipro PCP: Rachel Hobbs  HPI:  Rachel Hobbs is a 74 year old with a PMH of morbid obesity, chronic diastolic heart failure, DM, CKD stage IV-V (most recent Cr 5.7/BUN 20 in 5/15), HTN, and abdominal hernia (CT abdomen 04/2011). She is not on Ace/Arb due to CKD.   Admitted to [facility] 04/21/2012 due to dyspnea. Pro BNP 720.8 Diuresed with IV lasix and transitioned to Lasix 160 mg in am and 80 mg at night. Discharged weight 284 pounds. D/C Creatinine 4.03   She returns for follow up. Mild dyspnea with exertion. Sleeps on 2 pillows chronically. Able to walk 100 feet. Weight at home 240s. Not weighing daily. Up 4 lbs on our scales.  She saw Dr Moshe Cipro last week with no plan for HD yet. Only had 80 mg of lasix today.   ECHO 06/2011 EF 98-92% Grade II diastolic heart failure. RV normal  Labs (2/16): K 4.4, creatinine 5.79  ROS: All systems negative except as listed in HPI, PMH and Problem List.  Past Medical History  Diagnosis Date  . Depression   . Hyperlipidemia   . Hypertension   . Diabetes mellitus   . Hernia   . Renal disorder     renal insufficiency    Current Outpatient Prescriptions  Medication Sig Dispense Refill  . acetaminophen (TYLENOL) 500 MG tablet Take 1,000 mg by mouth every 6 (six) hours as needed for pain.    Marland Kitchen amLODipine (NORVASC) 10 MG tablet Take 1 tablet (10 mg total) by mouth daily. 90 tablet 3  . aspirin 81 MG tablet Take 81 mg by mouth daily.      Marland Kitchen atorvastatin (LIPITOR) 40 MG tablet TAKE ONE TABLET BY MOUTH EVERY DAY FOR CHOLESTEROL 90 tablet 2  . Blood Glucose Monitoring Suppl (BLOOD GLUCOSE METER) kit Use as instructed 1 each 0  . calcitRIOL (ROCALTROL) 0.25 MCG capsule TAKE ONE CAPSULE BY MOUTH DAILY (Patient taking differently: one cap every Mon, WEd, Friday) 30 capsule 0  . carvedilol (COREG) 12.5 MG tablet Take 12.5 mg by mouth 2 (two) times daily with a meal.     . cetirizine (ZYRTEC) 10 MG tablet Take 1 tablet (10 mg total) by mouth daily. 30 tablet 0  . cyclobenzaprine (FLEXERIL) 5 MG tablet Take 1 tablet (5 mg total) by mouth 3 (three) times daily as needed for muscle spasms. 30 tablet 0  . docusate sodium (COLACE) 100 MG capsule Take 100 mg by mouth at bedtime.     . furosemide (LASIX) 80 MG tablet TAKE 2 TABLETS BY MOUTH EVERY MORNING AND 1 TABLET EVERY AFTERNOON 270 tablet 3  . glipiZIDE (GLUCOTROL) 10 MG tablet Take 1 tablet (10 mg total) by mouth 2 (two) times daily before a meal. 180 tablet 3  . glycerin adult (GLYCERIN ADULT) 2 G SUPP Place 1 suppository rectally once. 2 suppository 0  . hydrALAZINE (APRESOLINE) 50 MG tablet Take 1.5 tablets (75 mg total) by mouth 3 (three) times daily. 135 tablet 6  . HYDROcodone-acetaminophen (NORCO/VICODIN) 5-325 MG per tablet Take 1 tablet by mouth every 4 (four) hours as needed for moderate pain or severe pain. 15 tablet 0  . hydrOXYzine (ATARAX/VISTARIL) 25 MG tablet Take 1 tablet (25 mg total) by mouth every 8 (eight) hours as needed for itching. 15 tablet 0  . Insulin Glargine (LANTUS) 100 UNIT/ML Solostar Pen Inject 30 Units into the skin  every morning. 15 mL 2  . isosorbide mononitrate (IMDUR) 30 MG 24 hr tablet Take 30 mg by mouth daily.    . Lancets (ONETOUCH ULTRASOFT) lancets Once daily testing plus prn for hypoglycemia 100 each 9  . metolazone (ZAROXOLYN) 2.5 MG tablet TAKE 1 TABLET BY MOUTH ONCE A WEEK AND AS NEEDED 10 tablet 3  . Multiple Vitamins-Minerals (ONE DAILY WOMENS) TABS Take by mouth daily.      Marland Kitchen NEEDLE, DISP, 30 G (B-D DISP NEEDLE 30GX1") 30G X 1" MISC 1 each by Does not apply route daily. 100 each 2  . ONE TOUCH ULTRA TEST test strip TEST BLOOD SUGAR ONCE DAILY AND AS NEEDED FOR SYMPTOMS OF HYPOGLYCEMIA 100 each 11  . polyethylene glycol powder (GLYCOLAX/MIRALAX) powder Take 17 g by mouth daily. 850 g 1  . senna (SENOKOT) 8.6 MG TABS tablet Take 1-2 tablets (8.6-17.2 mg total) by  mouth daily as needed for mild constipation. 15 each 0  . traMADol (ULTRAM) 50 MG tablet Take 1 tablet (50 mg total) by mouth every 12 (twelve) hours as needed. 30 tablet 0  . triamcinolone cream (KENALOG) 0.1 % Apply 1 application topically 3 (three) times daily. 60 g 0   No current facility-administered medications for this encounter.     PHYSICAL EXAM: Filed Vitals:   07/01/14 1441  BP: 152/64  Pulse: 73  Weight: 257 lb 12.8 oz (116.937 kg)  SpO2: 98%    General:   Sitting in WC. No resp difficulty HEENT: normal Neck: supple. Thick, JVP 14.  Carotids 2+ bilaterally; no bruits. No lymphadenopathy or thryomegaly appreciated. Cor: PMI nonpalpable Regular rate & rhythm. No rubs, gallops or murmurs. Lungs: clear Abdomen: soft, nontender, nondistended. No hepatosplenomegaly. No bruits or masses. Good bowel sounds. Large left-sided ab wall hernia. Nontender Extremities: no cyanosis, clubbing, rash, compression stockings on.    R and LLE 2+ edema to knees.  Neuro: alert & orientedx3, cranial nerves grossly intact. Moves all 4 extremities w/o difficulty. Affect pleasant.   ASSESSMENT & PLAN: 1. Chronic diastolic HF. ECHO 22-44% Grade II DD  Volume status elevated but only had 80 mg lasix this am.  Instructed to take 160 mg lasix this afternoon.  Continue lasix 160 mg in am 80 mg in pm. Continue metolazone as needed.  2. CKD, stage V- Followed  By Dr Mahlon Gammon.  3. Morbid obesity- Needs to lose weight.  4. HTN, uncontrolled- Still a little high but she didn't have second dose of hydralazine. Continue current dose of carvedilol.   Follow up in clinic 6 months.   CLEGG,AMY NP-C  3:04 PM   Patient seen with NP, agree with the above note.  She is volume overloaded on exam today with CKD stage V.  Creatinine has been high but stable.   - Increase metolazone from once weekly to twice weekly (Tuesdays and Saturdays). Continue the same Lasix, make sure to take as ordered.  - BMET 2  wks.  - Followup 3 months.   Loralie Champagne 07/02/2014

## 2014-07-01 NOTE — Patient Instructions (Signed)
INCREASE metolazone take every Tuesday and Saturday.  Labs (bmet) in 2 weeks.   Follow up in 3 months.

## 2014-07-02 DIAGNOSIS — N185 Chronic kidney disease, stage 5: Secondary | ICD-10-CM | POA: Insufficient documentation

## 2014-07-04 ENCOUNTER — Encounter (HOSPITAL_COMMUNITY)
Admission: RE | Admit: 2014-07-04 | Discharge: 2014-07-04 | Disposition: A | Discharge status: Home / Self Care | Payer: Medicare Other | Source: Ambulatory Visit | Attending: Nephrology | Admitting: Nephrology

## 2014-07-04 DIAGNOSIS — Z79899 Other long term (current) drug therapy: Secondary | ICD-10-CM | POA: Insufficient documentation

## 2014-07-04 DIAGNOSIS — D631 Anemia in chronic kidney disease: Secondary | ICD-10-CM | POA: Diagnosis not present

## 2014-07-04 DIAGNOSIS — N184 Chronic kidney disease, stage 4 (severe): Secondary | ICD-10-CM | POA: Insufficient documentation

## 2014-07-04 DIAGNOSIS — Z5181 Encounter for therapeutic drug level monitoring: Secondary | ICD-10-CM | POA: Diagnosis not present

## 2014-07-04 LAB — RENAL FUNCTION PANEL
Albumin: 3.5 g/dL (ref 3.5–5.2)
Anion gap: 16 — ABNORMAL HIGH (ref 5–15)
BUN: 60 mg/dL — ABNORMAL HIGH (ref 6–23)
CALCIUM: 8.9 mg/dL (ref 8.4–10.5)
CO2: 23 mmol/L (ref 19–32)
CREATININE: 5.57 mg/dL — AB (ref 0.50–1.10)
Chloride: 99 mmol/L (ref 96–112)
GFR calc non Af Amer: 7 mL/min — ABNORMAL LOW (ref >90)
GFR, EST AFRICAN AMERICAN: 8 mL/min — AB (ref >90)
GLUCOSE: 148 mg/dL — AB (ref 70–99)
POTASSIUM: 4.3 mmol/L (ref 3.5–5.1)
Phosphorus: 5.8 mg/dL — ABNORMAL HIGH (ref 2.3–4.6)
SODIUM: 138 mmol/L (ref 135–145)

## 2014-07-04 LAB — POCT HEMOGLOBIN-HEMACUE: Hemoglobin: 10.9 g/dL — ABNORMAL LOW (ref 12.0–15.0)

## 2014-07-04 MED ORDER — EPOETIN ALFA 20000 UNIT/ML IJ SOLN
20000.0000 [IU] | INTRAMUSCULAR | Status: DC
  Administered 2014-07-04: 20000 [IU] via SUBCUTANEOUS

## 2014-07-05 LAB — PTH, INTACT AND CALCIUM
Calcium, Total (PTH): 9 mg/dL (ref 8.7–10.3)
PTH: 172 pg/mL — ABNORMAL HIGH (ref 15–65)

## 2014-07-05 LAB — FERRITIN: Ferritin: 550 ng/mL — ABNORMAL HIGH (ref 10–291)

## 2014-07-05 LAB — IRON AND TIBC
IRON: 87 ug/dL (ref 42–145)
Saturation Ratios: 43 % (ref 20–55)
TIBC: 201 ug/dL — ABNORMAL LOW (ref 250–470)
UIBC: 114 ug/dL — ABNORMAL LOW (ref 125–400)

## 2014-07-05 MED ORDER — EPOETIN ALFA 20000 UNIT/ML IJ SOLN
INTRAMUSCULAR | Status: AC
  Filled 2014-07-05: qty 1

## 2014-08-01 ENCOUNTER — Encounter (HOSPITAL_COMMUNITY)
Admission: RE | Admit: 2014-08-01 | Discharge: 2014-08-01 | Disposition: A | Discharge status: Home / Self Care | Payer: Medicare Other | Source: Ambulatory Visit | Attending: Nephrology | Admitting: Nephrology

## 2014-08-01 DIAGNOSIS — D631 Anemia in chronic kidney disease: Secondary | ICD-10-CM | POA: Diagnosis not present

## 2014-08-01 DIAGNOSIS — N184 Chronic kidney disease, stage 4 (severe): Secondary | ICD-10-CM | POA: Diagnosis not present

## 2014-08-01 LAB — RENAL FUNCTION PANEL
Albumin: 3.3 g/dL — ABNORMAL LOW (ref 3.5–5.2)
Anion gap: 14 (ref 5–15)
BUN: 66 mg/dL — ABNORMAL HIGH (ref 6–23)
CALCIUM: 8.6 mg/dL (ref 8.4–10.5)
CO2: 24 mmol/L (ref 19–32)
Chloride: 100 mmol/L (ref 96–112)
Creatinine, Ser: 5.57 mg/dL — ABNORMAL HIGH (ref 0.50–1.10)
GFR calc Af Amer: 8 mL/min — ABNORMAL LOW (ref >90)
GFR, EST NON AFRICAN AMERICAN: 7 mL/min — AB (ref >90)
Glucose, Bld: 143 mg/dL — ABNORMAL HIGH (ref 70–99)
PHOSPHORUS: 6.3 mg/dL — AB (ref 2.3–4.6)
Potassium: 4.5 mmol/L (ref 3.5–5.1)
SODIUM: 138 mmol/L (ref 135–145)

## 2014-08-01 LAB — POCT HEMOGLOBIN-HEMACUE: Hemoglobin: 10.4 g/dL — ABNORMAL LOW (ref 12.0–15.0)

## 2014-08-01 MED ORDER — EPOETIN ALFA 20000 UNIT/ML IJ SOLN
INTRAMUSCULAR | Status: AC
  Filled 2014-08-01: qty 1

## 2014-08-01 MED ORDER — EPOETIN ALFA 20000 UNIT/ML IJ SOLN
20000.0000 [IU] | INTRAMUSCULAR | Status: DC
  Administered 2014-08-01: 20000 [IU] via SUBCUTANEOUS

## 2014-08-02 LAB — PTH, INTACT AND CALCIUM
CALCIUM TOTAL (PTH): 8.5 mg/dL — AB (ref 8.7–10.3)
PTH: 215 pg/mL — AB (ref 15–65)

## 2014-08-02 LAB — IRON AND TIBC
Iron: 98 ug/dL (ref 42–145)
Saturation Ratios: 48 % (ref 20–55)
TIBC: 203 ug/dL — AB (ref 250–470)
UIBC: 105 ug/dL — ABNORMAL LOW (ref 125–400)

## 2014-08-02 LAB — FERRITIN: FERRITIN: 671 ng/mL — AB (ref 10–291)

## 2014-08-13 ENCOUNTER — Ambulatory Visit: Payer: Medicare Other

## 2014-08-15 ENCOUNTER — Ambulatory Visit (INDEPENDENT_AMBULATORY_CARE_PROVIDER_SITE_OTHER): Payer: Medicare Other

## 2014-08-15 DIAGNOSIS — B351 Tinea unguium: Secondary | ICD-10-CM | POA: Diagnosis not present

## 2014-08-15 DIAGNOSIS — M79606 Pain in leg, unspecified: Secondary | ICD-10-CM

## 2014-08-15 DIAGNOSIS — M79673 Pain in unspecified foot: Secondary | ICD-10-CM

## 2014-08-15 DIAGNOSIS — E1140 Type 2 diabetes mellitus with diabetic neuropathy, unspecified: Secondary | ICD-10-CM

## 2014-08-16 ENCOUNTER — Ambulatory Visit: Payer: Medicare Other

## 2014-08-20 NOTE — Progress Notes (Signed)
HPI Presents today chief complaint of painful elongated toenails.  Objective: Pulses are palpable bilateral nails are thick, yellow dystrophic onychomycosis and painful palpation.   Assessment: Onychomycosis with pain in limb.  Plan: Treatment of nails in thickness and length as covered service secondary to pain.  

## 2014-08-29 ENCOUNTER — Encounter (HOSPITAL_COMMUNITY)
Admission: RE | Admit: 2014-08-29 | Discharge: 2014-08-29 | Disposition: A | Discharge status: Home / Self Care | Payer: Medicare Other | Source: Ambulatory Visit | Attending: Nephrology | Admitting: Nephrology

## 2014-08-29 DIAGNOSIS — D631 Anemia in chronic kidney disease: Secondary | ICD-10-CM | POA: Insufficient documentation

## 2014-08-29 DIAGNOSIS — N184 Chronic kidney disease, stage 4 (severe): Secondary | ICD-10-CM | POA: Insufficient documentation

## 2014-08-29 LAB — RENAL FUNCTION PANEL
ALBUMIN: 3.4 g/dL — AB (ref 3.5–5.0)
Anion gap: 14 (ref 5–15)
BUN: 79 mg/dL — AB (ref 6–20)
CO2: 24 mmol/L (ref 22–32)
CREATININE: 5.86 mg/dL — AB (ref 0.44–1.00)
Calcium: 8.4 mg/dL — ABNORMAL LOW (ref 8.9–10.3)
Chloride: 102 mmol/L (ref 101–111)
GFR calc Af Amer: 7 mL/min — ABNORMAL LOW (ref >60)
GFR calc non Af Amer: 6 mL/min — ABNORMAL LOW (ref >60)
Glucose, Bld: 169 mg/dL — ABNORMAL HIGH (ref 65–99)
PHOSPHORUS: 6.1 mg/dL — AB (ref 2.5–4.6)
POTASSIUM: 4.5 mmol/L (ref 3.5–5.1)
Sodium: 140 mmol/L (ref 135–145)

## 2014-08-29 LAB — IRON AND TIBC
Iron: 76 ug/dL (ref 28–170)
Saturation Ratios: 35 % — ABNORMAL HIGH (ref 10.4–31.8)
TIBC: 216 ug/dL — AB (ref 250–450)
UIBC: 140 ug/dL

## 2014-08-29 LAB — POCT HEMOGLOBIN-HEMACUE: Hemoglobin: 9.5 g/dL — ABNORMAL LOW (ref 12.0–15.0)

## 2014-08-29 LAB — FERRITIN: Ferritin: 423 ng/mL — ABNORMAL HIGH (ref 11–307)

## 2014-08-29 MED ORDER — EPOETIN ALFA 20000 UNIT/ML IJ SOLN
INTRAMUSCULAR | Status: AC
  Administered 2014-08-29: 20000 [IU] via SUBCUTANEOUS
  Filled 2014-08-29: qty 1

## 2014-08-29 MED ORDER — EPOETIN ALFA 20000 UNIT/ML IJ SOLN
20000.0000 [IU] | INTRAMUSCULAR | Status: DC
  Administered 2014-08-29: 20000 [IU] via SUBCUTANEOUS

## 2014-08-30 LAB — PTH, INTACT AND CALCIUM
CALCIUM TOTAL (PTH): 8.2 mg/dL — AB (ref 8.7–10.3)
PTH: 240 pg/mL — AB (ref 15–65)

## 2014-09-04 ENCOUNTER — Other Ambulatory Visit (HOSPITAL_COMMUNITY): Payer: Self-pay | Admitting: *Deleted

## 2014-09-05 ENCOUNTER — Encounter (HOSPITAL_COMMUNITY): Payer: Medicare Other

## 2014-09-26 ENCOUNTER — Encounter (HOSPITAL_COMMUNITY)
Admission: RE | Admit: 2014-09-26 | Discharge: 2014-09-26 | Disposition: A | Discharge status: Home / Self Care | Payer: Medicare Other | Source: Ambulatory Visit | Attending: Nephrology | Admitting: Nephrology

## 2014-09-26 DIAGNOSIS — N184 Chronic kidney disease, stage 4 (severe): Secondary | ICD-10-CM | POA: Insufficient documentation

## 2014-09-26 DIAGNOSIS — D631 Anemia in chronic kidney disease: Secondary | ICD-10-CM | POA: Insufficient documentation

## 2014-09-26 LAB — RENAL FUNCTION PANEL
ANION GAP: 13 (ref 5–15)
Albumin: 3.5 g/dL (ref 3.5–5.0)
BUN: 66 mg/dL — AB (ref 6–20)
CO2: 25 mmol/L (ref 22–32)
Calcium: 8.2 mg/dL — ABNORMAL LOW (ref 8.9–10.3)
Chloride: 98 mmol/L — ABNORMAL LOW (ref 101–111)
Creatinine, Ser: 5.95 mg/dL — ABNORMAL HIGH (ref 0.44–1.00)
GFR calc Af Amer: 7 mL/min — ABNORMAL LOW (ref >60)
GFR calc non Af Amer: 6 mL/min — ABNORMAL LOW (ref >60)
Glucose, Bld: 219 mg/dL — ABNORMAL HIGH (ref 65–99)
PHOSPHORUS: 5.6 mg/dL — AB (ref 2.5–4.6)
Potassium: 4.7 mmol/L (ref 3.5–5.1)
Sodium: 136 mmol/L (ref 135–145)

## 2014-09-26 LAB — IRON AND TIBC
Iron: 71 ug/dL (ref 28–170)
Saturation Ratios: 33 % — ABNORMAL HIGH (ref 10.4–31.8)
TIBC: 213 ug/dL — ABNORMAL LOW (ref 250–450)
UIBC: 142 ug/dL

## 2014-09-26 LAB — FERRITIN: FERRITIN: 330 ng/mL — AB (ref 11–307)

## 2014-09-26 MED ORDER — EPOETIN ALFA 20000 UNIT/ML IJ SOLN
INTRAMUSCULAR | Status: AC
  Administered 2014-09-26: 20000 [IU] via SUBCUTANEOUS
  Filled 2014-09-26: qty 1

## 2014-09-26 MED ORDER — EPOETIN ALFA 20000 UNIT/ML IJ SOLN
20000.0000 [IU] | INTRAMUSCULAR | Status: DC
  Administered 2014-09-26: 20000 [IU] via SUBCUTANEOUS

## 2014-09-27 LAB — POCT HEMOGLOBIN-HEMACUE: HEMOGLOBIN: 9.8 g/dL — AB (ref 12.0–15.0)

## 2014-09-27 LAB — PTH, INTACT AND CALCIUM
CALCIUM TOTAL (PTH): 8.2 mg/dL — AB (ref 8.7–10.3)
PTH: 263 pg/mL — ABNORMAL HIGH (ref 15–65)

## 2014-10-14 ENCOUNTER — Emergency Department (INDEPENDENT_AMBULATORY_CARE_PROVIDER_SITE_OTHER): Payer: Medicare Other

## 2014-10-14 ENCOUNTER — Encounter (HOSPITAL_COMMUNITY): Payer: Self-pay | Admitting: Emergency Medicine

## 2014-10-14 ENCOUNTER — Emergency Department (INDEPENDENT_AMBULATORY_CARE_PROVIDER_SITE_OTHER)
Admission: EM | Admit: 2014-10-14 | Discharge: 2014-10-14 | Disposition: A | Discharge status: Home / Self Care | Payer: Medicare Other | Source: Home / Self Care | Attending: Family Medicine | Admitting: Family Medicine

## 2014-10-14 DIAGNOSIS — M539 Dorsopathy, unspecified: Secondary | ICD-10-CM | POA: Diagnosis not present

## 2014-10-14 MED ORDER — TRAMADOL HCL 50 MG PO TABS: 50.0000 mg | ORAL_TABLET | Freq: Four times a day (QID) | ORAL | Status: DC | PRN

## 2014-10-14 NOTE — Discharge Instructions (Signed)
Heat to neck, use pain medicine as needed, see your doctor if further problems.

## 2014-10-14 NOTE — ED Provider Notes (Signed)
CSN: [id number]     Arrival date & time 10/14/14  1528 History   First MD Initiated Contact with Patient 10/14/14 1628     Chief Complaint  Patient presents with  . Neck Pain  . Headache   (Consider location/radiation/quality/duration/timing/severity/associated sxs/prior Treatment) Patient is a 74 y.o. female presenting with headaches. The history is provided by the patient.  Headache Pain location:  Occipital Quality:  Dull Radiates to:  Does not radiate Onset quality:  Gradual Duration:  4 days Progression:  Worsening Chronicity:  New Similar to prior headaches: yes   Context comment:  Pain in neck cause headache issues. Relieved by:  None tried Associated symptoms: neck pain   Associated symptoms: no blurred vision, no dizziness, no fever, no loss of balance, no nausea, no neck stiffness, no numbness, no photophobia, no visual change, no vomiting and no weakness     Past Medical History  Diagnosis Date  . Depression   . Hyperlipidemia   . Hypertension   . Diabetes mellitus   . Hernia   . Renal disorder     renal insufficiency   Past Surgical History  Procedure Laterality Date  . Abdominal hysterectomy      in the 70's  . Hernia repair      umbilical in the 03'U   Family History  Problem Relation Age of Onset  . Kidney disease Mother   . Hypertension Mother   . COPD Father     smoke  . Cancer Father     Lung  . Hypertension Father   . Cancer Brother     prostate  . Kidney disease Sister    History  Substance Use Topics  . Smoking status: Never Smoker   . Smokeless tobacco: Never Used  . Alcohol Use: No   OB History    No data available     Review of Systems  Constitutional: Negative.  Negative for fever.  Eyes: Negative for blurred vision and photophobia.  Gastrointestinal: Negative for nausea and vomiting.  Musculoskeletal: Positive for neck pain. Negative for neck stiffness.  Skin: Negative.   Neurological: Positive for headaches. Negative  for dizziness, speech difficulty, weakness, light-headedness, numbness and loss of balance.    Allergies  Review of patient's allergies indicates no known allergies.  Home Medications   Prior to Admission medications   Medication Sig Start Date End Date Taking? Authorizing Provider  acetaminophen (TYLENOL) 500 MG tablet Take 1,000 mg by mouth every 6 (six) hours as needed for pain.    Historical Provider, MD  amLODipine (NORVASC) 10 MG tablet Take 1 tablet (10 mg total) by mouth daily. 12/22/11   Cletus Gash, MD  aspirin 81 MG tablet Take 81 mg by mouth daily.      Historical Provider, MD  atorvastatin (LIPITOR) 40 MG tablet TAKE ONE TABLET BY MOUTH EVERY DAY FOR CHOLESTEROL 12/09/13   Olam Idler, MD  Blood Glucose Monitoring Suppl (BLOOD GLUCOSE METER) kit Use as instructed 05/09/13   Olam Idler, MD  calcitRIOL (ROCALTROL) 0.25 MCG capsule TAKE ONE CAPSULE BY MOUTH DAILY Patient taking differently: one cap every Jory Sims, Friday 06/12/13   Olam Idler, MD  carvedilol (COREG) 12.5 MG tablet Take 12.5 mg by mouth 2 (two) times daily with a meal. 12/22/11   Cletus Gash, MD  cetirizine (ZYRTEC) 10 MG tablet Take 1 tablet (10 mg total) by mouth daily. 06/22/12   Carolin Guernsey, MD  cyclobenzaprine (FLEXERIL) 5 MG tablet Take  1 tablet (5 mg total) by mouth 3 (three) times daily as needed for muscle spasms. 10/29/13   Olin Hauser, DO  docusate sodium (COLACE) 100 MG capsule Take 100 mg by mouth at bedtime.     Historical Provider, MD  furosemide (LASIX) 80 MG tablet TAKE 2 TABLETS BY MOUTH EVERY MORNING AND 1 TABLET EVERY AFTERNOON    Jolaine Artist, MD  glipiZIDE (GLUCOTROL) 10 MG tablet Take 1 tablet (10 mg total) by mouth 2 (two) times daily before a meal. 12/22/11   Cletus Gash, MD  glycerin adult (GLYCERIN ADULT) 2 G SUPP Place 1 suppository rectally once. 10/29/13   Olin Hauser, DO  hydrALAZINE (APRESOLINE) 50 MG tablet Take 1.5 tablets (75 mg  total) by mouth 3 (three) times daily. 02/19/13   Jolaine Artist, MD  HYDROcodone-acetaminophen (NORCO/VICODIN) 5-325 MG per tablet Take 1 tablet by mouth every 4 (four) hours as needed for moderate pain or severe pain. 04/25/14   Clayton Bibles, PA-C  hydrOXYzine (ATARAX/VISTARIL) 25 MG tablet Take 1 tablet (25 mg total) by mouth every 8 (eight) hours as needed for itching. 12/25/13   Harden Mo, MD  Insulin Glargine (LANTUS) 100 UNIT/ML Solostar Pen Inject 30 Units into the skin every morning. 05/03/14   Olam Idler, MD  isosorbide mononitrate (IMDUR) 30 MG 24 hr tablet Take 30 mg by mouth daily.    Historical Provider, MD  Lancets Glory Rosebush ULTRASOFT) lancets Once daily testing plus prn for hypoglycemia 05/09/13   Olam Idler, MD  metolazone (ZAROXOLYN) 2.5 MG tablet Take 1 tablet (2.5 mg total) by mouth 2 (two) times a week. Every Tuesday and Saturday 07/01/14   Larey Dresser, MD  Multiple Vitamins-Minerals (ONE DAILY WOMENS) TABS Take by mouth daily.      Historical Provider, MD  NEEDLE, DISP, 30 G (B-D DISP NEEDLE 30GX1") 30G X 1" MISC 1 each by Does not apply route daily. 05/03/14   Olam Idler, MD  ONE TOUCH ULTRA TEST test strip TEST BLOOD SUGAR ONCE DAILY AND AS NEEDED FOR SYMPTOMS OF HYPOGLYCEMIA 06/05/14   Olam Idler, MD  polyethylene glycol powder (GLYCOLAX/MIRALAX) powder Take 17 g by mouth daily. 08/13/13   Patrecia Pour, MD  senna (SENOKOT) 8.6 MG TABS tablet Take 1-2 tablets (8.6-17.2 mg total) by mouth daily as needed for mild constipation. 10/29/13   Olin Hauser, DO  traMADol (ULTRAM) 50 MG tablet Take 1 tablet (50 mg total) by mouth every 6 (six) hours as needed. For neck pain 10/14/14   Billy Fischer, MD  triamcinolone cream (KENALOG) 0.1 % Apply 1 application topically 3 (three) times daily. 12/25/13   Harden Mo, MD   BP 161/68 mmHg  Pulse 81  Temp(Src) 99.4 F (37.4 C) (Oral)  Resp 20  SpO2 94% Physical Exam  Constitutional: She is oriented to person,  place, and time. She appears well-developed and well-nourished. No distress.  HENT:  Right Ear: Tympanic membrane, external ear and ear canal normal.  Left Ear: Tympanic membrane, external ear and ear canal normal.  Eyes: EOM are normal. Pupils are equal, round, and reactive to light.  Neck: Trachea normal and normal range of motion. Neck supple. Muscular tenderness present.  Lymphadenopathy:    She has no cervical adenopathy.  Neurological: She is alert and oriented to person, place, and time.  Skin: Skin is warm and dry.  Nursing note and vitals reviewed.   ED Course  Procedures (including critical  care time) Labs Review Labs Reviewed - No data to display  Imaging Review Dg Cervical Spine Complete  10/14/2014   CLINICAL DATA:  Initial encounter for 2-3 day history of neck pain without reported trauma.  EXAM: CERVICAL SPINE  4+ VIEWS  COMPARISON:  None.  FINDINGS: No evidence for fracture. No subluxation. Loss of disc height is seen diffusely in the cervical spine. Large anterior flowing bridging osteophytes are seen from C3-C7, consistent with DISH. Straightening of the normal cervical lordosis is associated. No worrisome lytic or sclerotic osseous abnormality.  IMPRESSION: No evidence for cervical spine fracture.  Large anterior flowing and bridging osteophytosis consistent with DISH.   Electronically Signed   By: Misty Stanley M.D.   On: 10/14/2014 17:07   X-rays reviewed and report per radiologist.   MDM   1. Multilevel degenerative disc disease        Billy Fischer, MD 10/14/14 [phone]

## 2014-10-14 NOTE — ED Notes (Signed)
Head and neck pain that started 3-4 days ago.  No known injury

## 2014-10-15 ENCOUNTER — Ambulatory Visit: Payer: Medicare Other | Admitting: Family Medicine

## 2014-10-24 ENCOUNTER — Inpatient Hospital Stay (HOSPITAL_COMMUNITY): Admission: RE | Admit: 2014-10-24 | Payer: Medicare Other | Source: Ambulatory Visit

## 2014-10-26 ENCOUNTER — Other Ambulatory Visit: Payer: Self-pay | Admitting: Family Medicine

## 2014-11-06 ENCOUNTER — Encounter (HOSPITAL_COMMUNITY)
Admission: RE | Admit: 2014-11-06 | Discharge: 2014-11-06 | Disposition: A | Discharge status: Home / Self Care | Payer: Medicare Other | Source: Ambulatory Visit | Attending: Nephrology | Admitting: Nephrology

## 2014-11-06 DIAGNOSIS — N184 Chronic kidney disease, stage 4 (severe): Secondary | ICD-10-CM | POA: Diagnosis not present

## 2014-11-06 DIAGNOSIS — D631 Anemia in chronic kidney disease: Secondary | ICD-10-CM | POA: Diagnosis present

## 2014-11-06 LAB — RENAL FUNCTION PANEL
ALBUMIN: 3.3 g/dL — AB (ref 3.5–5.0)
Anion gap: 10 (ref 5–15)
BUN: 68 mg/dL — ABNORMAL HIGH (ref 6–20)
CHLORIDE: 103 mmol/L (ref 101–111)
CO2: 26 mmol/L (ref 22–32)
Calcium: 8.9 mg/dL (ref 8.9–10.3)
Creatinine, Ser: 5.97 mg/dL — ABNORMAL HIGH (ref 0.44–1.00)
GFR calc Af Amer: 7 mL/min — ABNORMAL LOW (ref >60)
GFR, EST NON AFRICAN AMERICAN: 6 mL/min — AB (ref >60)
GLUCOSE: 136 mg/dL — AB (ref 65–99)
Phosphorus: 5.4 mg/dL — ABNORMAL HIGH (ref 2.5–4.6)
Potassium: 4.6 mmol/L (ref 3.5–5.1)
Sodium: 139 mmol/L (ref 135–145)

## 2014-11-06 LAB — IRON AND TIBC
Iron: 64 ug/dL (ref 28–170)
SATURATION RATIOS: 30 % (ref 10.4–31.8)
TIBC: 216 ug/dL — ABNORMAL LOW (ref 250–450)
UIBC: 152 ug/dL

## 2014-11-06 LAB — POCT HEMOGLOBIN-HEMACUE: HEMOGLOBIN: 9.6 g/dL — AB (ref 12.0–15.0)

## 2014-11-06 LAB — FERRITIN: Ferritin: 378 ng/mL — ABNORMAL HIGH (ref 11–307)

## 2014-11-06 MED ORDER — EPOETIN ALFA 20000 UNIT/ML IJ SOLN
20000.0000 [IU] | INTRAMUSCULAR | Status: DC
  Administered 2014-11-06: 20000 [IU] via SUBCUTANEOUS

## 2014-11-06 MED ORDER — EPOETIN ALFA 20000 UNIT/ML IJ SOLN
INTRAMUSCULAR | Status: AC
  Administered 2014-11-06: 20000 [IU] via SUBCUTANEOUS
  Filled 2014-11-06: qty 1

## 2014-11-07 LAB — PTH, INTACT AND CALCIUM
CALCIUM TOTAL (PTH): 8.7 mg/dL (ref 8.7–10.3)
PTH: 223 pg/mL — AB (ref 15–65)

## 2014-11-14 ENCOUNTER — Ambulatory Visit (INDEPENDENT_AMBULATORY_CARE_PROVIDER_SITE_OTHER): Payer: Medicare Other | Admitting: Podiatry

## 2014-11-14 ENCOUNTER — Other Ambulatory Visit: Payer: Self-pay | Admitting: Family Medicine

## 2014-11-14 ENCOUNTER — Encounter: Payer: Self-pay | Admitting: Podiatry

## 2014-11-14 DIAGNOSIS — M79673 Pain in unspecified foot: Secondary | ICD-10-CM

## 2014-11-14 DIAGNOSIS — M79609 Pain in unspecified limb: Secondary | ICD-10-CM

## 2014-11-14 DIAGNOSIS — E1140 Type 2 diabetes mellitus with diabetic neuropathy, unspecified: Secondary | ICD-10-CM

## 2014-11-14 DIAGNOSIS — E1151 Type 2 diabetes mellitus with diabetic peripheral angiopathy without gangrene: Secondary | ICD-10-CM

## 2014-11-14 DIAGNOSIS — B351 Tinea unguium: Secondary | ICD-10-CM

## 2014-11-14 NOTE — Progress Notes (Signed)
Patient ID: Rachel Hobbs, female   DOB: 19-Aug-1940, 74 y.o.   MRN: [id number] HPI  Complaint:  Visit Type: Patient returns to my office for continued preventative foot care services. Complaint: Patient states" my nails have grown long and thick and become painful to walk and wear shoes" Patient has been diagnosed with DM . This patient  presents for preventative foot care services. No changes to ROS  Podiatric Exam: Vascular: dorsalis pedis and posterior tibial pulses are non palpable due to swelling both feet.. Capillary return is slow to refill. Temperature gradient is negative. Skin turgor WNL, bilateral swelling  Sensorium: Diminished  Semmes Weinstein monofilament test. Normal tactile sensation bilaterally.  Nail Exam: Pt has thick disfigured discolored nails with subungual debris noted bilateral entire nail hallux through fifth toenails Ulcer Exam: There is no evidence of ulcer or pre-ulcerative changes or infection. Orthopedic Exam: Muscle tone and strength are WNL. No limitations in general ROM. No crepitus or effusions noted. Foot type and digits show no abnormalities. Bony prominences are unremarkable. Skin: No Porokeratosis. No infection or ulcers  Diagnosis:  Onychomycosis, Pain in right toe, pain in left toes  Treatment & Plan Procedures and Treatment: Consent by patient was obtained for treatment procedures. The patient understood the discussion of treatment and procedures well. All questions were answered thoroughly reviewed. Debridement of mycotic and hypertrophic toenails, 1 through 5 bilateral and clearing of subungual debris. No ulceration, no infection noted.  Return Visit-Office Procedure: Patient instructed to return to the office for a follow up visit 3 months for continued evaluation and treatment.

## 2014-11-22 ENCOUNTER — Encounter: Payer: Self-pay | Admitting: Family Medicine

## 2014-11-22 ENCOUNTER — Ambulatory Visit (INDEPENDENT_AMBULATORY_CARE_PROVIDER_SITE_OTHER): Payer: Medicare Other | Admitting: Family Medicine

## 2014-11-22 VITALS — BP 187/61 | HR 73 | Temp 98.2°F | Ht 62.0 in | Wt 253.5 lb

## 2014-11-22 DIAGNOSIS — E118 Type 2 diabetes mellitus with unspecified complications: Secondary | ICD-10-CM

## 2014-11-22 DIAGNOSIS — I10 Essential (primary) hypertension: Secondary | ICD-10-CM | POA: Diagnosis not present

## 2014-11-22 DIAGNOSIS — R0989 Other specified symptoms and signs involving the circulatory and respiratory systems: Secondary | ICD-10-CM

## 2014-11-22 LAB — POCT GLYCOSYLATED HEMOGLOBIN (HGB A1C): HEMOGLOBIN A1C: 6.9

## 2014-11-22 NOTE — Progress Notes (Signed)
  Patient name: [name] MRN [id number]  Date of birth: 07/19/1940  CC & HPI:  Rachel Hobbs is a 74 y.o. female presenting today for f/u on diabetes, htn and no pulses in feet.   DIABETES   Symptoms of Hypoglycemia? yes  Comorbid Symptoms: no Chest pain; no SOB; no Neuropathy: no Vision problems  Medication Compliance: yes   Medication Side Effects: low blood sugar  HTN - denies chest pain, shortness of breath, lightheadedness today - not taking hydralazine 3 times a day - has been out of metolazone for several weeks - often takes half or no furosemide due to frequent urination afterward  No pulses - advised to follow-up me by podiatrist, who was unable to get lower extremity  Pulses - Denies current foot pain or claudication No history of strokes or MI - takes aspirin and Lipitor  ROS: See HPI  Medical & Surgical Hx:  Reviewed  Medications & Allergies: Reviewed  Social History: Reviewed:   Objective Findings:  Vitals: BP 187/61 mmHg  Pulse 73  Temp(Src) 98.2 F (36.8 C) (Oral)  Ht 5\' 2"  (1.575 m)  Wt 253 lb 8 oz (114.987 kg)  BMI 46.35 kg/m2  Gen: NAD CV: RRR w/o m/r/g, pulses +2 b/l Resp: CTAB w/ normal respiratory effort Foot Exam: DP Pulses 2+; No Ulcers, bruises or cuts  Assessment & Plan:   Please See Problem Focused Assessment & Plan

## 2014-11-22 NOTE — Assessment & Plan Note (Signed)
Able to palpate DP pulses bilaterally today. Denies symptoms of claudication. Foot xrays reveal arterial calcification - Patient declines arterial duplex study at this time as she is asymptomatic

## 2014-11-22 NOTE — Assessment & Plan Note (Signed)
Endorses several episodes of hypoglycemia over the past month - Discontinue glipizide - Continue Lantus 30 units every morning - check blood sugars every morning;  Prior to medications or eating - f/u in 2-3 weeks with PCP - advised to bring BGs to that visit

## 2014-11-22 NOTE — Assessment & Plan Note (Signed)
BP elevated today, but she admits to being noncompliant with several blood pressure medications - Out of metolazone for several weeks - Often skips or takes half doses of furosemide due to urination - Takes hydralazine BID instead of TID - advised to follow-up with cardiologist for BP  - advised to take all BP meds prior to next PCP apt in 2-3 weeks

## 2014-11-22 NOTE — Patient Instructions (Signed)
It was great seeing you today.   1. Stop taking glipizide.  2. Continue taking Lantus 30 units every morning 3. Checked your blood sugar every morning before eating or taking medications 4. Bring all your medications and blood sugar readings to next doctor's appointment. 5. Call your heart doctor to discuss your blood pressure medications.   Please bring all your medications to every doctors visit  Sign up for My Chart to have easy access to your labs results, and communication with your Primary care physician.  Next Appointment  Please make an appointment with Dr Berkley Harvey in 2-3 weeks for diabetes.   I look forward to talking with you again at our next visit. If you have any questions or concerns before then, please call the clinic at [phone].  Take Care,   Dr Phill Myron

## 2014-12-05 ENCOUNTER — Encounter (HOSPITAL_COMMUNITY)
Admission: RE | Admit: 2014-12-05 | Discharge: 2014-12-05 | Disposition: A | Discharge status: Home / Self Care | Payer: Medicare Other | Source: Ambulatory Visit | Attending: Nephrology | Admitting: Nephrology

## 2014-12-05 DIAGNOSIS — D631 Anemia in chronic kidney disease: Secondary | ICD-10-CM | POA: Insufficient documentation

## 2014-12-05 DIAGNOSIS — N184 Chronic kidney disease, stage 4 (severe): Secondary | ICD-10-CM | POA: Insufficient documentation

## 2014-12-05 LAB — RENAL FUNCTION PANEL
Albumin: 3.5 g/dL (ref 3.5–5.0)
Anion gap: 13 (ref 5–15)
BUN: 72 mg/dL — AB (ref 6–20)
CALCIUM: 8.3 mg/dL — AB (ref 8.9–10.3)
CO2: 25 mmol/L (ref 22–32)
CREATININE: 6.42 mg/dL — AB (ref 0.44–1.00)
Chloride: 100 mmol/L — ABNORMAL LOW (ref 101–111)
GFR calc Af Amer: 7 mL/min — ABNORMAL LOW (ref >60)
GFR calc non Af Amer: 6 mL/min — ABNORMAL LOW (ref >60)
GLUCOSE: 206 mg/dL — AB (ref 65–99)
Phosphorus: 6.4 mg/dL — ABNORMAL HIGH (ref 2.5–4.6)
Potassium: 4.5 mmol/L (ref 3.5–5.1)
SODIUM: 138 mmol/L (ref 135–145)

## 2014-12-05 LAB — POCT HEMOGLOBIN-HEMACUE: HEMOGLOBIN: 9.6 g/dL — AB (ref 12.0–15.0)

## 2014-12-05 LAB — IRON AND TIBC
Iron: 59 ug/dL (ref 28–170)
Saturation Ratios: 26 % (ref 10.4–31.8)
TIBC: 224 ug/dL — ABNORMAL LOW (ref 250–450)
UIBC: 165 ug/dL

## 2014-12-05 LAB — FERRITIN: Ferritin: 317 ng/mL — ABNORMAL HIGH (ref 11–307)

## 2014-12-05 MED ORDER — EPOETIN ALFA 20000 UNIT/ML IJ SOLN
20000.0000 [IU] | INTRAMUSCULAR | Status: DC
  Administered 2014-12-05: 20000 [IU] via SUBCUTANEOUS

## 2014-12-05 MED ORDER — EPOETIN ALFA 20000 UNIT/ML IJ SOLN
INTRAMUSCULAR | Status: AC
  Filled 2014-12-05: qty 1

## 2014-12-06 ENCOUNTER — Ambulatory Visit (INDEPENDENT_AMBULATORY_CARE_PROVIDER_SITE_OTHER): Payer: Medicare Other | Admitting: Family Medicine

## 2014-12-06 ENCOUNTER — Encounter: Payer: Self-pay | Admitting: Family Medicine

## 2014-12-06 VITALS — BP 142/62 | HR 76 | Temp 98.3°F | Ht 62.0 in | Wt 255.4 lb

## 2014-12-06 DIAGNOSIS — I10 Essential (primary) hypertension: Secondary | ICD-10-CM | POA: Diagnosis not present

## 2014-12-06 DIAGNOSIS — E785 Hyperlipidemia, unspecified: Secondary | ICD-10-CM | POA: Diagnosis not present

## 2014-12-06 DIAGNOSIS — I5032 Chronic diastolic (congestive) heart failure: Secondary | ICD-10-CM | POA: Diagnosis not present

## 2014-12-06 DIAGNOSIS — E118 Type 2 diabetes mellitus with unspecified complications: Secondary | ICD-10-CM

## 2014-12-06 LAB — PTH, INTACT AND CALCIUM
CALCIUM TOTAL (PTH): 8.2 mg/dL — AB (ref 8.7–10.3)
PTH: 205 pg/mL — AB (ref 15–65)

## 2014-12-06 MED ORDER — INSULIN PEN NEEDLE 31G X 8 MM MISC: Status: DC

## 2014-12-06 NOTE — Assessment & Plan Note (Signed)
Poor compliance with cardiac medications.  Advised following up with cardiologist within a month

## 2014-12-06 NOTE — Patient Instructions (Signed)
It was great seeing you today.   1. Call and schedule appointment with your cardiologist next month 2. Continue to check her blood sugar every morning; call the clinic and report these numbers in 1-2 weeks 3. Continue to take Lantus 30 units daily; do not take glipizide   Please bring all your medications to every doctors visit  Sign up for My Chart to have easy access to your labs results, and communication with your Primary care physician.  Next Appointment  Please make an appointment with Dr Berkley Harvey in 2 months   I look forward to talking with you again at our next visit. If you have any questions or concerns before then, please call the clinic at [phone].  Take Care,   Dr Phill Myron

## 2014-12-06 NOTE — Assessment & Plan Note (Signed)
Tolerating Lipitor well.  Continue

## 2014-12-06 NOTE — Assessment & Plan Note (Addendum)
No more low blood sugars - continue lantus 30 units daily - not on glipizide any longer due to hypoglycemia  - Advised following up with ophthalmologist for recheck of diabetic retinopathy - Asked her to call and report blood sugar readings in 2 weeks

## 2014-12-06 NOTE — Assessment & Plan Note (Signed)
BP within normal limits on repeat; however poor compliance with blood pressure medication regimen.  Advised checking several times a week and calling if BP consistently over 150/90 - f/u with Cardiology in next month and Nephrology in Oct

## 2014-12-06 NOTE — Progress Notes (Signed)
  Patient name: Rachel Hobbs MRN [id number]  Date of birth: 01-15-1941  CC & HPI:  Rachel Hobbs is a 74 y.o. female presenting today for DM and HTN.   DIABETES  Lab Results  Component Value Date   HGBA1C 6.9 11/22/2014    Blood Sugar Ranges: 79 - 170  Symptoms of Hypoglycemia? No  Comorbid Symptoms:  Neuropathy - yes: Vision problems - Yes  Medication Side Effects: no  CHRONIC HYPERTENSION BP Readings from Last 3 Encounters:  12/06/14 183/58  12/05/14 158/52  11/22/14 187/61    Disease Monitoring  Blood pressure range outside clinc: At [place] 130-150/60-70  Chest pain: no   Dyspnea: no   Claudication: no  Medication Side Effects: No dizziness  HLD Lab Results  Component Value Date   LDLCALC 57 06/27/2013    Medication Compliance: yes  Side Effects?: no muscle pain or weakness, RUQ pain; jaundice  Medication compliance: no, often missing doses or skips doses of lasix   Preventitive Healthcare:  Smoking: never   Exercise: walks 2 days; able to walk 3-4 blocks / 15-20 minutes before back pain stops her   Salt Restriction: < 1500 mg daily  ROS: See HPI   Medical & Surgical Hx:  Reviewed  Medications & Allergies: Reviewed  Social History: Reviewed:   Objective Findings:  Vitals: BP 183/58 mmHg  Pulse 76  Temp(Src) 98.3 F (36.8 C) (Oral)  Ht 5\' 2"  (1.575 m)  Wt 255 lb 6 oz (115.837 kg)  BMI 46.70 kg/m2  Gen: NAD CV: RRR w/o m/r/g, pulses +2 b/l Resp: CTAB w/ normal respiratory effort  Assessment & Plan:   Please See Problem Focused Assessment & Plan

## 2014-12-13 ENCOUNTER — Telehealth: Payer: Self-pay | Admitting: Family Medicine

## 2014-12-13 NOTE — Telephone Encounter (Signed)
Ms. Filipiak called regarding her glucose levels for fasting   8/22- 79 8/23- 121 8/24- 174 8/25- 194 8/26-148  Want to go back on her glipizide medication.  Think that will help keep her blood sugars low.

## 2014-12-16 ENCOUNTER — Other Ambulatory Visit: Payer: Self-pay | Admitting: Family Medicine

## 2014-12-16 DIAGNOSIS — Z1231 Encounter for screening mammogram for malignant neoplasm of breast: Secondary | ICD-10-CM

## 2014-12-25 ENCOUNTER — Ambulatory Visit (HOSPITAL_COMMUNITY)
Admission: RE | Admit: 2014-12-25 | Discharge: 2014-12-25 | Disposition: A | Discharge status: Home / Self Care | Payer: Medicare Other | Source: Ambulatory Visit | Attending: Family Medicine | Admitting: Family Medicine

## 2014-12-25 ENCOUNTER — Other Ambulatory Visit (HOSPITAL_COMMUNITY): Payer: Self-pay | Admitting: *Deleted

## 2014-12-25 DIAGNOSIS — Z1231 Encounter for screening mammogram for malignant neoplasm of breast: Secondary | ICD-10-CM | POA: Insufficient documentation

## 2014-12-26 ENCOUNTER — Encounter (HOSPITAL_COMMUNITY): Payer: Medicare Other

## 2015-01-02 ENCOUNTER — Encounter (HOSPITAL_COMMUNITY)
Admission: RE | Admit: 2015-01-02 | Discharge: 2015-01-02 | Disposition: A | Discharge status: Home / Self Care | Payer: Medicare Other | Source: Ambulatory Visit | Attending: Nephrology | Admitting: Nephrology

## 2015-01-02 ENCOUNTER — Encounter (HOSPITAL_COMMUNITY): Payer: Medicare Other

## 2015-01-02 DIAGNOSIS — N184 Chronic kidney disease, stage 4 (severe): Secondary | ICD-10-CM | POA: Insufficient documentation

## 2015-01-02 DIAGNOSIS — D631 Anemia in chronic kidney disease: Secondary | ICD-10-CM | POA: Diagnosis not present

## 2015-01-02 LAB — RENAL FUNCTION PANEL
ANION GAP: 14 (ref 5–15)
Albumin: 3.6 g/dL (ref 3.5–5.0)
BUN: 74 mg/dL — ABNORMAL HIGH (ref 6–20)
CHLORIDE: 100 mmol/L — AB (ref 101–111)
CO2: 23 mmol/L (ref 22–32)
Calcium: 8.5 mg/dL — ABNORMAL LOW (ref 8.9–10.3)
Creatinine, Ser: 6.22 mg/dL — ABNORMAL HIGH (ref 0.44–1.00)
GFR calc non Af Amer: 6 mL/min — ABNORMAL LOW (ref >60)
GFR, EST AFRICAN AMERICAN: 7 mL/min — AB (ref >60)
Glucose, Bld: 196 mg/dL — ABNORMAL HIGH (ref 65–99)
Phosphorus: 5.7 mg/dL — ABNORMAL HIGH (ref 2.5–4.6)
Potassium: 4.2 mmol/L (ref 3.5–5.1)
Sodium: 137 mmol/L (ref 135–145)

## 2015-01-02 LAB — IRON AND TIBC
Iron: 244 ug/dL — ABNORMAL HIGH (ref 28–170)
SATURATION RATIOS: 110 % — AB (ref 10.4–31.8)
TIBC: 223 ug/dL — AB (ref 250–450)

## 2015-01-02 LAB — FERRITIN: FERRITIN: 276 ng/mL (ref 11–307)

## 2015-01-02 LAB — POCT HEMOGLOBIN-HEMACUE: Hemoglobin: 8.8 g/dL — ABNORMAL LOW (ref 12.0–15.0)

## 2015-01-02 MED ORDER — EPOETIN ALFA 20000 UNIT/ML IJ SOLN
INTRAMUSCULAR | Status: AC
  Administered 2015-01-02: 20000 [IU] via SUBCUTANEOUS
  Filled 2015-01-02: qty 1

## 2015-01-02 MED ORDER — SODIUM CHLORIDE 0.9 % IV SOLN
510.0000 mg | INTRAVENOUS | Status: DC
  Administered 2015-01-02: 510 mg via INTRAVENOUS
  Filled 2015-01-02: qty 17

## 2015-01-02 MED ORDER — EPOETIN ALFA 20000 UNIT/ML IJ SOLN: 20000.0000 [IU] | INTRAMUSCULAR | Status: DC

## 2015-01-03 LAB — PTH, INTACT AND CALCIUM
Calcium, Total (PTH): 8.5 mg/dL — ABNORMAL LOW (ref 8.7–10.3)
PTH: 192 pg/mL — ABNORMAL HIGH (ref 15–65)

## 2015-01-09 ENCOUNTER — Encounter (HOSPITAL_COMMUNITY)
Admission: RE | Admit: 2015-01-09 | Discharge: 2015-01-09 | Disposition: A | Discharge status: Home / Self Care | Payer: Medicare Other | Source: Ambulatory Visit | Attending: Nephrology | Admitting: Nephrology

## 2015-01-09 DIAGNOSIS — D631 Anemia in chronic kidney disease: Secondary | ICD-10-CM | POA: Diagnosis not present

## 2015-01-09 MED ORDER — SODIUM CHLORIDE 0.9 % IV SOLN
510.0000 mg | INTRAVENOUS | Status: DC
  Administered 2015-01-09: 510 mg via INTRAVENOUS
  Filled 2015-01-09: qty 17

## 2015-01-13 ENCOUNTER — Other Ambulatory Visit (HOSPITAL_COMMUNITY): Payer: Self-pay | Admitting: Cardiology

## 2015-01-13 ENCOUNTER — Other Ambulatory Visit: Payer: Self-pay | Admitting: Family Medicine

## 2015-01-13 NOTE — Telephone Encounter (Signed)
Please call and have her make Diabetes appointment for f/u in next 1-2 months and bring her meter

## 2015-01-14 ENCOUNTER — Encounter: Payer: Self-pay | Admitting: *Deleted

## 2015-01-14 NOTE — Telephone Encounter (Signed)
Tried calling patient at both numbers listed and there was no answer or voicemail available.  Will mail letter asking her to call office and schedule an appt to follow up on her diabetes. Jazmin Hartsell,CMA

## 2015-01-30 ENCOUNTER — Encounter (HOSPITAL_COMMUNITY)
Admission: RE | Admit: 2015-01-30 | Discharge: 2015-01-30 | Disposition: A | Discharge status: Home / Self Care | Payer: Medicare Other | Source: Ambulatory Visit | Attending: Nephrology | Admitting: Nephrology

## 2015-01-30 DIAGNOSIS — D631 Anemia in chronic kidney disease: Secondary | ICD-10-CM | POA: Diagnosis not present

## 2015-01-30 DIAGNOSIS — N184 Chronic kidney disease, stage 4 (severe): Secondary | ICD-10-CM | POA: Diagnosis not present

## 2015-01-30 LAB — POCT HEMOGLOBIN-HEMACUE: Hemoglobin: 9.6 g/dL — ABNORMAL LOW (ref 12.0–15.0)

## 2015-01-30 LAB — IRON AND TIBC
IRON: 74 ug/dL (ref 28–170)
SATURATION RATIOS: 34 % — AB (ref 10.4–31.8)
TIBC: 217 ug/dL — AB (ref 250–450)
UIBC: 143 ug/dL

## 2015-01-30 LAB — RENAL FUNCTION PANEL
ALBUMIN: 3.6 g/dL (ref 3.5–5.0)
ANION GAP: 15 (ref 5–15)
BUN: 84 mg/dL — ABNORMAL HIGH (ref 6–20)
CHLORIDE: 99 mmol/L — AB (ref 101–111)
CO2: 27 mmol/L (ref 22–32)
Calcium: 8.9 mg/dL (ref 8.9–10.3)
Creatinine, Ser: 6.68 mg/dL — ABNORMAL HIGH (ref 0.44–1.00)
GFR, EST AFRICAN AMERICAN: 6 mL/min — AB (ref >60)
GFR, EST NON AFRICAN AMERICAN: 5 mL/min — AB (ref >60)
Glucose, Bld: 154 mg/dL — ABNORMAL HIGH (ref 65–99)
POTASSIUM: 4.7 mmol/L (ref 3.5–5.1)
Phosphorus: 5.9 mg/dL — ABNORMAL HIGH (ref 2.5–4.6)
Sodium: 141 mmol/L (ref 135–145)

## 2015-01-30 LAB — FERRITIN: FERRITIN: 573 ng/mL — AB (ref 11–307)

## 2015-01-30 MED ORDER — EPOETIN ALFA 20000 UNIT/ML IJ SOLN
INTRAMUSCULAR | Status: DC
Start: 2015-01-30 — End: 2015-01-31
  Filled 2015-01-30: qty 1

## 2015-01-30 MED ORDER — EPOETIN ALFA 20000 UNIT/ML IJ SOLN
20000.0000 [IU] | INTRAMUSCULAR | Status: DC
  Administered 2015-01-30: 20000 [IU] via SUBCUTANEOUS

## 2015-01-31 LAB — PTH, INTACT AND CALCIUM
Calcium, Total (PTH): 8.7 mg/dL (ref 8.7–10.3)
PTH: 289 pg/mL — ABNORMAL HIGH (ref 15–65)

## 2015-02-13 ENCOUNTER — Ambulatory Visit (INDEPENDENT_AMBULATORY_CARE_PROVIDER_SITE_OTHER): Payer: Medicare Other | Admitting: Family Medicine

## 2015-02-13 ENCOUNTER — Encounter: Payer: Self-pay | Admitting: Family Medicine

## 2015-02-13 VITALS — BP 124/72 | HR 74 | Temp 98.3°F | Ht 62.0 in | Wt 253.1 lb

## 2015-02-13 DIAGNOSIS — E118 Type 2 diabetes mellitus with unspecified complications: Secondary | ICD-10-CM | POA: Diagnosis not present

## 2015-02-13 DIAGNOSIS — Z23 Encounter for immunization: Secondary | ICD-10-CM | POA: Diagnosis not present

## 2015-02-13 LAB — POCT GLYCOSYLATED HEMOGLOBIN (HGB A1C): HEMOGLOBIN A1C: 6.8

## 2015-02-13 NOTE — Assessment & Plan Note (Signed)
Advised f/u with Cardiologist; Was supposed to f/u last month

## 2015-02-13 NOTE — Patient Instructions (Signed)
It was great seeing you today.   1. Stop taking glipizide.  2. Take Lantus 30 units every morning after breakfast.  3. Check your blood sugar every morning prior to eating breakfast or taking her medications.  Bring your blood sugar meter and long of your numbers to your next appointment in 2-3 weeks 4. Call your cardiologist to get refills of your heart medications and make a follow-up appointment: Address: [address], [place], [place]; Phone: [phone] 5. Call your eye doctor to make a diabetes eye exam appointment   Please bring all your medications to every doctors visit  Sign up for My Chart to have easy access to your labs results, and communication with your Primary care physician.  Next Appointment  Please make an appointment with Dr Berkley Harvey in 2-3 weeks for diabetes   I look forward to talking with you again at our next visit. If you have any questions or concerns before then, please call the clinic at [phone].  Take Care,   Dr Phill Myron

## 2015-02-13 NOTE — Assessment & Plan Note (Signed)
Recommended stopping Glipizide - Take lantus in morning, and recommended taking every day - She will check BGs qam and bring back to clinic in 2-3 weeks.  - Advised DM eye exam again - Her Nephrologist advised having fistula placed, but she is seeking second opinion

## 2015-02-13 NOTE — Assessment & Plan Note (Signed)
-   Her Nephrologist advised having fistula placed, but she is seeking second opinion

## 2015-02-13 NOTE — Progress Notes (Signed)
  Patient name: Rachel Hobbs MRN [id number]  Date of birth: [birth year]  CC & HPI:  Rachel Hobbs is a 74 y.o. female presenting today for DM. She continues to take glipizide though advised to stop at last visit. Check BGs but sometimes after eating. Denies low BGs. Never followed up with eye exam. Sometimes skips lantus when her blood sugar is "low".   ROS: See HPI   Medical & Surgical Hx:  Reviewed  Medications & Allergies: Reviewed  Social History: Reviewed:   Objective Findings:  Vitals: BP 124/72 mmHg  Pulse 74  Temp(Src) 98.3 F (36.8 C) (Oral)  Ht 5\' 2"  (1.575 m)  Wt 253 lb 1 oz (114.788 kg)  BMI 46.27 kg/m2  Gen: NAD CV: RRR w/o m/r/g, pulses +2 b/l Resp: CTAB w/ normal respiratory effort  Assessment & Plan:   HYPERTENSION, BENIGN SYSTEMIC Advised f/u with Cardiologist; Was supposed to f/u last month  Diastolic CHF Advised f/u with Cardiologist; Was supposed to f/u last month  DM type 2, controlled, with complication Recommended stopping Glipizide - Take lantus in morning, and recommended taking every day - She will check BGs qam and bring back to clinic in 2-3 weeks.  - Advised DM eye exam again - Her Nephrologist advised having fistula placed, but she is seeking second opinion   End stage renal disease - Her Nephrologist advised having fistula placed, but she is seeking second opinion

## 2015-02-24 ENCOUNTER — Other Ambulatory Visit: Payer: Self-pay

## 2015-02-24 DIAGNOSIS — Z01810 Encounter for preprocedural cardiovascular examination: Secondary | ICD-10-CM

## 2015-02-24 DIAGNOSIS — N184 Chronic kidney disease, stage 4 (severe): Secondary | ICD-10-CM

## 2015-02-25 ENCOUNTER — Ambulatory Visit: Payer: Medicare Other | Admitting: Podiatry

## 2015-02-27 ENCOUNTER — Encounter (HOSPITAL_COMMUNITY)
Admission: RE | Admit: 2015-02-27 | Discharge: 2015-02-27 | Disposition: A | Discharge status: Home / Self Care | Payer: Medicare Other | Source: Ambulatory Visit | Attending: Nephrology | Admitting: Nephrology

## 2015-02-27 DIAGNOSIS — N184 Chronic kidney disease, stage 4 (severe): Secondary | ICD-10-CM | POA: Diagnosis not present

## 2015-02-27 DIAGNOSIS — D631 Anemia in chronic kidney disease: Secondary | ICD-10-CM | POA: Diagnosis present

## 2015-02-27 LAB — RENAL FUNCTION PANEL
Albumin: 3.7 g/dL (ref 3.5–5.0)
Anion gap: 13 (ref 5–15)
BUN: 80 mg/dL — AB (ref 6–20)
CHLORIDE: 101 mmol/L (ref 101–111)
CO2: 25 mmol/L (ref 22–32)
CREATININE: 6.4 mg/dL — AB (ref 0.44–1.00)
Calcium: 8.2 mg/dL — ABNORMAL LOW (ref 8.9–10.3)
GFR, EST AFRICAN AMERICAN: 7 mL/min — AB (ref >60)
GFR, EST NON AFRICAN AMERICAN: 6 mL/min — AB (ref >60)
GLUCOSE: 157 mg/dL — AB (ref 65–99)
PHOSPHORUS: 6.7 mg/dL — AB (ref 2.5–4.6)
POTASSIUM: 4.1 mmol/L (ref 3.5–5.1)
Sodium: 139 mmol/L (ref 135–145)

## 2015-02-27 LAB — IRON AND TIBC
IRON: 71 ug/dL (ref 28–170)
Saturation Ratios: 31 % (ref 10.4–31.8)
TIBC: 225 ug/dL — ABNORMAL LOW (ref 250–450)
UIBC: 154 ug/dL

## 2015-02-27 LAB — POCT HEMOGLOBIN-HEMACUE: Hemoglobin: 9.6 g/dL — ABNORMAL LOW (ref 12.0–15.0)

## 2015-02-27 LAB — FERRITIN: FERRITIN: 417 ng/mL — AB (ref 11–307)

## 2015-02-27 MED ORDER — EPOETIN ALFA 20000 UNIT/ML IJ SOLN
20000.0000 [IU] | INTRAMUSCULAR | Status: DC
  Administered 2015-02-27: 20000 [IU] via SUBCUTANEOUS

## 2015-02-27 MED ORDER — EPOETIN ALFA 20000 UNIT/ML IJ SOLN
INTRAMUSCULAR | Status: AC
  Administered 2015-02-27: 20000 [IU] via SUBCUTANEOUS
  Filled 2015-02-27: qty 1

## 2015-02-28 LAB — PTH, INTACT AND CALCIUM
CALCIUM TOTAL (PTH): 8.1 mg/dL — AB (ref 8.7–10.3)
PTH: 260 pg/mL — AB (ref 15–65)

## 2015-03-06 ENCOUNTER — Encounter (HOSPITAL_COMMUNITY): Payer: Self-pay | Admitting: Internal Medicine

## 2015-03-06 ENCOUNTER — Ambulatory Visit (HOSPITAL_COMMUNITY)
Admission: RE | Admit: 2015-03-06 | Discharge: 2015-03-06 | Disposition: A | Discharge status: Home / Self Care | Payer: Medicare Other | Source: Ambulatory Visit | Attending: Internal Medicine | Admitting: Internal Medicine

## 2015-03-06 VITALS — BP 134/82 | HR 67 | Wt 258.5 lb

## 2015-03-06 DIAGNOSIS — I132 Hypertensive heart and chronic kidney disease with heart failure and with stage 5 chronic kidney disease, or end stage renal disease: Secondary | ICD-10-CM | POA: Diagnosis not present

## 2015-03-06 DIAGNOSIS — N185 Chronic kidney disease, stage 5: Secondary | ICD-10-CM | POA: Insufficient documentation

## 2015-03-06 DIAGNOSIS — I5032 Chronic diastolic (congestive) heart failure: Secondary | ICD-10-CM | POA: Insufficient documentation

## 2015-03-06 NOTE — Progress Notes (Signed)
ADVANCED HF CLINIC NOTE  Patient ID: Rachel Hobbs, female   DOB: [birth year], 74 y.o.   MRN: [id number]  Nephrologist: Dr. Moshe Cipro PCP: Rachel Hobbs  HPI:  Rachel Hobbs is a 74 year old with a PMH of morbid obesity, chronic diastolic heart failure, DM, CKD stage IV-V (most recent Cr 6.4/BUN 80 in 11/16), HTN, and abdominal hernia (CT abdomen 04/2011). She is not on Ace/Arb due to CKD.   Admitted to [facility] 04/21/2012 due to dyspnea. Pro BNP 720.8 Diuresed with IV lasix and transitioned to Lasix 160 mg in am and 80 mg at night. Discharged weight 284 pounds. D/C Creatinine 4.03   She returns for follow up. Feels ok. Mild dyspnea with exertion. Weight up and down but relatively stable. No significant edema. Renal has recommended AVF placement but she is reluctant. Taking Lasix 160/80 but sometimes misses afternoon dose.   ECHO 06/2011 EF 27-25% Grade II diastolic heart failure. RV normal  Labs (2/16): K 4.4, creatinine 5.79 Labs (11/16): K 4.1, creatinine 6.4  ROS: All systems negative except as listed in HPI, PMH and Problem List.  Past Medical History  Diagnosis Date  . Depression   . Hyperlipidemia   . Hypertension   . Diabetes mellitus   . Hernia   . Renal disorder     renal insufficiency    Current Outpatient Prescriptions  Medication Sig Dispense Refill  . acetaminophen (TYLENOL) 500 MG tablet Take 1,000 mg by mouth every 6 (six) hours as needed for pain.    Marland Kitchen amLODipine (NORVASC) 10 MG tablet Take 1 tablet (10 mg total) by mouth daily. 90 tablet 3  . aspirin 81 MG tablet Take 81 mg by mouth daily.      Marland Kitchen atorvastatin (LIPITOR) 40 MG tablet TAKE ONE TABLET BY MOUTH EVERY DAY FOR CHOLESTEROL 90 tablet 3  . Blood Glucose Monitoring Suppl (BLOOD GLUCOSE METER) kit Use as instructed 1 each 0  . calcitRIOL (ROCALTROL) 0.25 MCG capsule Take 0.25 mcg by mouth 3 (three) times a week. Take on Monday, Wednesday and Friday    . carvedilol (COREG) 12.5 MG tablet Take 12.5 mg by mouth 2 (two)  times daily with a meal.    . cetirizine (ZYRTEC) 10 MG tablet Take 1 tablet (10 mg total) by mouth daily. 30 tablet 0  . cyclobenzaprine (FLEXERIL) 5 MG tablet Take 1 tablet (5 mg total) by mouth 3 (three) times daily as needed for muscle spasms. 30 tablet 0  . docusate sodium (COLACE) 100 MG capsule Take 100 mg by mouth at bedtime.     . furosemide (LASIX) 80 MG tablet TAKE 2 TABLETS BY MOUTH EVERY MORNING AND 1 TABLET EVERY AFTERNOON 270 tablet 3  . glycerin adult (GLYCERIN ADULT) 2 G SUPP Place 1 suppository rectally once. 2 suppository 0  . hydrALAZINE (APRESOLINE) 50 MG tablet Take 1.5 tablets (75 mg total) by mouth 3 (three) times daily. 135 tablet 6  . Insulin Pen Needle 31G X 8 MM MISC BD UltraFine III Pen Needles. For use with insulin pen device. Inject insulin 6 x daily 100 each 3  . isosorbide mononitrate (IMDUR) 30 MG 24 hr tablet Take 30 mg by mouth daily.    . Lancets (ONETOUCH ULTRASOFT) lancets Once daily testing plus prn for hypoglycemia 100 each 9  . LANTUS SOLOSTAR 100 UNIT/ML Solostar Pen INJECT 30 UNITS INTO THE SKIN EVERY MORNING 15 mL 0  . metolazone (ZAROXOLYN) 2.5 MG tablet TAKE 1 TABLET BY MOUTH EVERY TUESDAY AND  SATURDAY 25 tablet 0  . Multiple Vitamins-Minerals (ONE DAILY WOMENS) TABS Take by mouth daily.      Marland Kitchen NEEDLE, DISP, 30 G (B-D DISP NEEDLE 30GX1") 30G X 1" MISC 1 each by Does not apply route daily. 100 each 2  . ONE TOUCH ULTRA TEST test strip TEST BLOOD SUGAR ONCE DAILY AND AS NEEDED FOR SYMPTOMS OF HYPOGLYCEMIA 100 each 11  . polyethylene glycol powder (GLYCOLAX/MIRALAX) powder Take 17 g by mouth daily. 850 g 1  . senna (SENOKOT) 8.6 MG TABS tablet Take 1-2 tablets (8.6-17.2 mg total) by mouth daily as needed for mild constipation. 15 each 0  . triamcinolone cream (KENALOG) 0.1 % Apply 1 application topically 3 (three) times daily. 60 g 0  . HYDROcodone-acetaminophen (NORCO/VICODIN) 5-325 MG per tablet Take 1 tablet by mouth every 4 (four) hours as needed for  moderate pain or severe pain. (Patient not taking: Reported on 03/06/2015) 15 tablet 0  . hydrOXYzine (ATARAX/VISTARIL) 25 MG tablet Take 1 tablet (25 mg total) by mouth every 8 (eight) hours as needed for itching. (Patient not taking: Reported on 03/06/2015) 15 tablet 0  . traMADol (ULTRAM) 50 MG tablet Take 1 tablet (50 mg total) by mouth every 6 (six) hours as needed. For neck pain (Patient not taking: Reported on 03/06/2015) 20 tablet 0   No current facility-administered medications for this encounter.     PHYSICAL EXAM: Filed Vitals:   03/06/15 1354  BP: 134/82  Pulse: 67  Weight: 258 lb 8 oz (117.255 kg)  SpO2: 98%    General:   Sitting in WC. No resp difficulty HEENT: normal Neck: supple. Thick, JVP 8-9.  Carotids 2+ bilaterally; no bruits. No lymphadenopathy or thryomegaly appreciated. Cor: PMI nonpalpable Regular rate & rhythm. No rubs, gallops or murmurs. Lungs: clear Abdomen: soft, nontender, nondistended. No hepatosplenomegaly. No bruits or masses. Good bowel sounds. Large left-sided ab wall hernia. Nontender Extremities: no cyanosis, clubbing, rash,  R and LLE trace edema Neuro: alert & orientedx3, cranial nerves grossly intact. Moves all 4 extremities w/o difficulty. Affect pleasant.   ASSESSMENT & PLAN: 1. Chronic diastolic HF. ECHO 51-07% Grade II DD  Volume status mildly elevated but only had 80 mg lasix this am.  Instructed to take 160 mg lasix this afternoon.  Continue lasix 160 mg in am 80 mg in pm. Continue metolazone as needed.  2. CKD, stage V- Followed  By Dr Mahlon Gammon. I stressed need for AVF placement as her renal dysfunction continues to progress 3. Morbid obesity- Needs to lose weight.  4. HTN, uncontrolled- Still a little high but she didn't have second dose of hydralazine. Continue current dose of carvedilol.   Follow up in clinic 6 months.    Glori Bickers MD  2:32 PM

## 2015-03-06 NOTE — Patient Instructions (Signed)
Please remember to take your afternoon dose of Furosemide  We will contact you in 6 months to schedule your next appointment.

## 2015-03-10 ENCOUNTER — Other Ambulatory Visit: Payer: Self-pay | Admitting: Family Medicine

## 2015-03-21 ENCOUNTER — Ambulatory Visit (INDEPENDENT_AMBULATORY_CARE_PROVIDER_SITE_OTHER): Payer: Medicare Other | Admitting: Podiatry

## 2015-03-21 ENCOUNTER — Encounter: Payer: Self-pay | Admitting: Podiatry

## 2015-03-21 DIAGNOSIS — B351 Tinea unguium: Secondary | ICD-10-CM | POA: Diagnosis not present

## 2015-03-21 DIAGNOSIS — M79676 Pain in unspecified toe(s): Secondary | ICD-10-CM | POA: Diagnosis not present

## 2015-03-21 DIAGNOSIS — M79609 Pain in unspecified limb: Secondary | ICD-10-CM

## 2015-03-21 DIAGNOSIS — E1151 Type 2 diabetes mellitus with diabetic peripheral angiopathy without gangrene: Secondary | ICD-10-CM

## 2015-03-21 DIAGNOSIS — E118 Type 2 diabetes mellitus with unspecified complications: Secondary | ICD-10-CM | POA: Diagnosis not present

## 2015-03-21 DIAGNOSIS — Z794 Long term (current) use of insulin: Secondary | ICD-10-CM

## 2015-03-21 NOTE — Progress Notes (Signed)
Patient ID: Rachel Hobbs, female   DOB: 11/02/1940, 74 y.o.   MRN: [id number] HPI  Complaint:  Visit Type: Patient returns to my office for continued preventative foot care services. Complaint: Patient states" my nails have grown long and thick and become painful to walk and wear shoes" Patient has been diagnosed with DM . This patient  presents for preventative foot care services. No changes to ROS  Podiatric Exam: Vascular: dorsalis pedis and posterior tibial pulses are non palpable due to swelling both feet.. Capillary return is slow to refill. Temperature gradient is negative. Skin turgor WNL, bilateral swelling  Sensorium: Diminished  Semmes Weinstein monofilament test. Normal tactile sensation bilaterally.  Nail Exam: Pt has thick disfigured discolored nails with subungual debris noted bilateral entire nail hallux through fifth toenails Ulcer Exam: There is no evidence of ulcer or pre-ulcerative changes or infection. Orthopedic Exam: Muscle tone and strength are WNL. No limitations in general ROM. No crepitus or effusions noted. Foot type and digits show no abnormalities. Bony prominences are unremarkable. Skin: No Porokeratosis. No infection or ulcers  Diagnosis:  Onychomycosis, Pain in right toe, pain in left toes  Treatment & Plan Procedures and Treatment: Consent by patient was obtained for treatment procedures. The patient understood the discussion of treatment and procedures well. All questions were answered thoroughly reviewed. Debridement of mycotic and hypertrophic toenails, 1 through 5 bilateral and clearing of subungual debris. No ulceration, no infection noted.  Return Visit-Office Procedure: Patient instructed to return to the office for a follow up visit 3 months for continued evaluation and treatment.  Gardiner Barefoot DPM

## 2015-03-27 ENCOUNTER — Encounter (HOSPITAL_COMMUNITY)
Admission: RE | Admit: 2015-03-27 | Discharge: 2015-03-27 | Disposition: A | Discharge status: Home / Self Care | Payer: Medicare Other | Source: Ambulatory Visit | Attending: Nephrology | Admitting: Nephrology

## 2015-03-27 DIAGNOSIS — N184 Chronic kidney disease, stage 4 (severe): Secondary | ICD-10-CM | POA: Insufficient documentation

## 2015-03-27 DIAGNOSIS — D631 Anemia in chronic kidney disease: Secondary | ICD-10-CM | POA: Diagnosis not present

## 2015-03-27 LAB — FERRITIN: FERRITIN: 367 ng/mL — AB (ref 11–307)

## 2015-03-27 LAB — RENAL FUNCTION PANEL
Albumin: 3.3 g/dL — ABNORMAL LOW (ref 3.5–5.0)
Anion gap: 13 (ref 5–15)
BUN: 81 mg/dL — ABNORMAL HIGH (ref 6–20)
CHLORIDE: 104 mmol/L (ref 101–111)
CO2: 26 mmol/L (ref 22–32)
Calcium: 7.8 mg/dL — ABNORMAL LOW (ref 8.9–10.3)
Creatinine, Ser: 5.87 mg/dL — ABNORMAL HIGH (ref 0.44–1.00)
GFR, EST AFRICAN AMERICAN: 7 mL/min — AB (ref >60)
GFR, EST NON AFRICAN AMERICAN: 6 mL/min — AB (ref >60)
Glucose, Bld: 95 mg/dL (ref 65–99)
POTASSIUM: 4.4 mmol/L (ref 3.5–5.1)
Phosphorus: 6.2 mg/dL — ABNORMAL HIGH (ref 2.5–4.6)
Sodium: 143 mmol/L (ref 135–145)

## 2015-03-27 LAB — IRON AND TIBC
IRON: 59 ug/dL (ref 28–170)
SATURATION RATIOS: 28 % (ref 10.4–31.8)
TIBC: 213 ug/dL — AB (ref 250–450)
UIBC: 154 ug/dL

## 2015-03-27 LAB — POCT HEMOGLOBIN-HEMACUE: HEMOGLOBIN: 9 g/dL — AB (ref 12.0–15.0)

## 2015-03-27 MED ORDER — EPOETIN ALFA 20000 UNIT/ML IJ SOLN
20000.0000 [IU] | INTRAMUSCULAR | Status: DC
  Administered 2015-03-27: 20000 [IU] via SUBCUTANEOUS

## 2015-03-27 MED ORDER — EPOETIN ALFA 20000 UNIT/ML IJ SOLN
INTRAMUSCULAR | Status: AC
  Filled 2015-03-27: qty 1

## 2015-03-28 LAB — PTH, INTACT AND CALCIUM
Calcium, Total (PTH): 7.6 mg/dL — ABNORMAL LOW (ref 8.7–10.3)
PTH: 341 pg/mL — ABNORMAL HIGH (ref 15–65)

## 2015-04-17 ENCOUNTER — Encounter: Payer: Self-pay | Admitting: Surgery

## 2015-04-24 ENCOUNTER — Encounter (HOSPITAL_COMMUNITY)
Admission: RE | Admit: 2015-04-24 | Discharge: 2015-04-24 | Disposition: A | Discharge status: Home / Self Care | Payer: Medicare Other | Source: Ambulatory Visit | Attending: Nephrology | Admitting: Nephrology

## 2015-04-24 DIAGNOSIS — N184 Chronic kidney disease, stage 4 (severe): Secondary | ICD-10-CM | POA: Diagnosis not present

## 2015-04-24 DIAGNOSIS — D631 Anemia in chronic kidney disease: Secondary | ICD-10-CM | POA: Insufficient documentation

## 2015-04-24 LAB — RENAL FUNCTION PANEL
ANION GAP: 11 (ref 5–15)
Albumin: 3.3 g/dL — ABNORMAL LOW (ref 3.5–5.0)
BUN: 75 mg/dL — ABNORMAL HIGH (ref 6–20)
CALCIUM: 8.3 mg/dL — AB (ref 8.9–10.3)
CHLORIDE: 105 mmol/L (ref 101–111)
CO2: 25 mmol/L (ref 22–32)
Creatinine, Ser: 6.14 mg/dL — ABNORMAL HIGH (ref 0.44–1.00)
GFR calc Af Amer: 7 mL/min — ABNORMAL LOW (ref >60)
GFR calc non Af Amer: 6 mL/min — ABNORMAL LOW (ref >60)
GLUCOSE: 105 mg/dL — AB (ref 65–99)
POTASSIUM: 4.6 mmol/L (ref 3.5–5.1)
Phosphorus: 5.8 mg/dL — ABNORMAL HIGH (ref 2.5–4.6)
SODIUM: 141 mmol/L (ref 135–145)

## 2015-04-24 LAB — POCT HEMOGLOBIN-HEMACUE: HEMOGLOBIN: 9.6 g/dL — AB (ref 12.0–15.0)

## 2015-04-24 LAB — IRON AND TIBC
Iron: 60 ug/dL (ref 28–170)
SATURATION RATIOS: 28 % (ref 10.4–31.8)
TIBC: 216 ug/dL — ABNORMAL LOW (ref 250–450)
UIBC: 156 ug/dL

## 2015-04-24 LAB — FERRITIN: FERRITIN: 331 ng/mL — AB (ref 11–307)

## 2015-04-24 MED ORDER — EPOETIN ALFA 20000 UNIT/ML IJ SOLN
INTRAMUSCULAR | Status: AC
  Administered 2015-04-24: 20000 [IU] via SUBCUTANEOUS
  Filled 2015-04-24: qty 1

## 2015-04-24 MED ORDER — EPOETIN ALFA 20000 UNIT/ML IJ SOLN
20000.0000 [IU] | INTRAMUSCULAR | Status: DC
  Administered 2015-04-24: 20000 [IU] via SUBCUTANEOUS

## 2015-04-25 LAB — PTH, INTACT AND CALCIUM
Calcium, Total (PTH): 8.3 mg/dL — ABNORMAL LOW (ref 8.7–10.3)
PTH: 280 pg/mL — ABNORMAL HIGH (ref 15–65)

## 2015-04-28 ENCOUNTER — Inpatient Hospital Stay (HOSPITAL_COMMUNITY)
Admission: RE | Admit: 2015-04-28 | Discharge: 2015-04-28 | Disposition: A | Discharge status: Home / Self Care | Payer: Medicare Other | Source: Ambulatory Visit

## 2015-04-28 ENCOUNTER — Ambulatory Visit: Payer: Medicare Other | Admitting: Surgery

## 2015-04-28 DIAGNOSIS — Z01810 Encounter for preprocedural cardiovascular examination: Secondary | ICD-10-CM

## 2015-04-28 DIAGNOSIS — N184 Chronic kidney disease, stage 4 (severe): Secondary | ICD-10-CM

## 2015-05-13 ENCOUNTER — Encounter: Payer: Self-pay | Admitting: Vascular Surgery

## 2015-05-22 ENCOUNTER — Encounter (HOSPITAL_COMMUNITY): Payer: Medicare Other

## 2015-05-22 ENCOUNTER — Ambulatory Visit: Payer: Medicare Other | Admitting: Vascular Surgery

## 2015-05-22 ENCOUNTER — Inpatient Hospital Stay (HOSPITAL_COMMUNITY): Admission: RE | Admit: 2015-05-22 | Payer: Medicare Other | Source: Ambulatory Visit

## 2015-05-22 ENCOUNTER — Other Ambulatory Visit (HOSPITAL_COMMUNITY): Payer: Medicare Other

## 2015-06-12 ENCOUNTER — Encounter: Payer: Self-pay | Admitting: Vascular Surgery

## 2015-06-19 ENCOUNTER — Encounter (HOSPITAL_COMMUNITY): Payer: Medicare Other

## 2015-06-19 ENCOUNTER — Ambulatory Visit: Payer: Medicare Other | Admitting: Vascular Surgery

## 2015-06-19 ENCOUNTER — Telehealth: Payer: Self-pay | Admitting: Vascular Surgery

## 2015-06-19 ENCOUNTER — Other Ambulatory Visit (HOSPITAL_COMMUNITY): Payer: Medicare Other

## 2015-06-19 NOTE — Telephone Encounter (Signed)
LM for pt to cb and r.s her missed access eval appt, dpm

## 2015-06-20 ENCOUNTER — Ambulatory Visit: Payer: Medicare Other | Admitting: Podiatry

## 2015-06-28 ENCOUNTER — Emergency Department (HOSPITAL_COMMUNITY): Payer: Medicare Other

## 2015-06-28 ENCOUNTER — Encounter (HOSPITAL_COMMUNITY): Payer: Self-pay | Admitting: Emergency Medicine

## 2015-06-28 DIAGNOSIS — Z9114 Patient's other noncompliance with medication regimen: Secondary | ICD-10-CM | POA: Diagnosis not present

## 2015-06-28 DIAGNOSIS — I132 Hypertensive heart and chronic kidney disease with heart failure and with stage 5 chronic kidney disease, or end stage renal disease: Secondary | ICD-10-CM | POA: Diagnosis not present

## 2015-06-28 DIAGNOSIS — R0602 Shortness of breath: Secondary | ICD-10-CM | POA: Diagnosis not present

## 2015-06-28 DIAGNOSIS — N185 Chronic kidney disease, stage 5: Secondary | ICD-10-CM | POA: Diagnosis present

## 2015-06-28 DIAGNOSIS — I5033 Acute on chronic diastolic (congestive) heart failure: Secondary | ICD-10-CM | POA: Diagnosis present

## 2015-06-28 DIAGNOSIS — E1122 Type 2 diabetes mellitus with diabetic chronic kidney disease: Secondary | ICD-10-CM | POA: Diagnosis present

## 2015-06-28 DIAGNOSIS — R601 Generalized edema: Secondary | ICD-10-CM | POA: Diagnosis not present

## 2015-06-28 DIAGNOSIS — D631 Anemia in chronic kidney disease: Secondary | ICD-10-CM | POA: Diagnosis not present

## 2015-06-28 DIAGNOSIS — Z91128 Patient's intentional underdosing of medication regimen for other reason: Secondary | ICD-10-CM | POA: Diagnosis not present

## 2015-06-28 DIAGNOSIS — Z841 Family history of disorders of kidney and ureter: Secondary | ICD-10-CM

## 2015-06-28 DIAGNOSIS — Z7982 Long term (current) use of aspirin: Secondary | ICD-10-CM | POA: Diagnosis not present

## 2015-06-28 DIAGNOSIS — E785 Hyperlipidemia, unspecified: Secondary | ICD-10-CM | POA: Diagnosis not present

## 2015-06-28 DIAGNOSIS — Z6841 Body Mass Index (BMI) 40.0 and over, adult: Secondary | ICD-10-CM

## 2015-06-28 DIAGNOSIS — T501X6A Underdosing of loop [high-ceiling] diuretics, initial encounter: Secondary | ICD-10-CM | POA: Diagnosis present

## 2015-06-28 DIAGNOSIS — Z8249 Family history of ischemic heart disease and other diseases of the circulatory system: Secondary | ICD-10-CM | POA: Diagnosis not present

## 2015-06-28 DIAGNOSIS — R05 Cough: Secondary | ICD-10-CM | POA: Diagnosis not present

## 2015-06-28 DIAGNOSIS — D649 Anemia, unspecified: Secondary | ICD-10-CM | POA: Diagnosis not present

## 2015-06-28 DIAGNOSIS — Z794 Long term (current) use of insulin: Secondary | ICD-10-CM | POA: Diagnosis not present

## 2015-06-28 LAB — BASIC METABOLIC PANEL
ANION GAP: 15 (ref 5–15)
BUN: 76 mg/dL — ABNORMAL HIGH (ref 6–20)
CO2: 23 mmol/L (ref 22–32)
Calcium: 8 mg/dL — ABNORMAL LOW (ref 8.9–10.3)
Chloride: 101 mmol/L (ref 101–111)
Creatinine, Ser: 6.13 mg/dL — ABNORMAL HIGH (ref 0.44–1.00)
GFR, EST AFRICAN AMERICAN: 7 mL/min — AB (ref >60)
GFR, EST NON AFRICAN AMERICAN: 6 mL/min — AB (ref >60)
GLUCOSE: 143 mg/dL — AB (ref 65–99)
POTASSIUM: 4.4 mmol/L (ref 3.5–5.1)
Sodium: 139 mmol/L (ref 135–145)

## 2015-06-28 LAB — CBC
HEMATOCRIT: 26.3 % — AB (ref 36.0–46.0)
HEMOGLOBIN: 8.3 g/dL — AB (ref 12.0–15.0)
MCH: 30.5 pg (ref 26.0–34.0)
MCHC: 31.6 g/dL (ref 30.0–36.0)
MCV: 96.7 fL (ref 78.0–100.0)
Platelets: 185 10*3/uL (ref 150–400)
RBC: 2.72 MIL/uL — AB (ref 3.87–5.11)
RDW: 14.3 % (ref 11.5–15.5)
WBC: 3.6 10*3/uL — ABNORMAL LOW (ref 4.0–10.5)

## 2015-06-28 LAB — I-STAT TROPONIN, ED: TROPONIN I, POC: 0.02 ng/mL (ref 0.00–0.08)

## 2015-06-28 NOTE — ED Notes (Signed)
Pt c/o shortness of breath onset today increases with exertion. Pt with dry cough.

## 2015-06-29 ENCOUNTER — Encounter (HOSPITAL_COMMUNITY): Payer: Self-pay | Admitting: Emergency Medicine

## 2015-06-29 ENCOUNTER — Inpatient Hospital Stay (HOSPITAL_COMMUNITY)
Admission: EM | Admit: 2015-06-29 | Discharge: 2015-07-02 | DRG: 291 | Disposition: A | Discharge status: Home Health | Payer: Medicare Other | Attending: Family Medicine | Admitting: Family Medicine

## 2015-06-29 DIAGNOSIS — N189 Chronic kidney disease, unspecified: Secondary | ICD-10-CM | POA: Insufficient documentation

## 2015-06-29 DIAGNOSIS — D649 Anemia, unspecified: Secondary | ICD-10-CM

## 2015-06-29 DIAGNOSIS — R601 Generalized edema: Secondary | ICD-10-CM | POA: Diagnosis present

## 2015-06-29 DIAGNOSIS — I132 Hypertensive heart and chronic kidney disease with heart failure and with stage 5 chronic kidney disease, or end stage renal disease: Secondary | ICD-10-CM | POA: Diagnosis present

## 2015-06-29 DIAGNOSIS — Z8249 Family history of ischemic heart disease and other diseases of the circulatory system: Secondary | ICD-10-CM | POA: Diagnosis not present

## 2015-06-29 DIAGNOSIS — I509 Heart failure, unspecified: Secondary | ICD-10-CM | POA: Diagnosis not present

## 2015-06-29 DIAGNOSIS — Z6841 Body Mass Index (BMI) 40.0 and over, adult: Secondary | ICD-10-CM | POA: Diagnosis not present

## 2015-06-29 DIAGNOSIS — N185 Chronic kidney disease, stage 5: Secondary | ICD-10-CM

## 2015-06-29 DIAGNOSIS — E785 Hyperlipidemia, unspecified: Secondary | ICD-10-CM | POA: Diagnosis present

## 2015-06-29 DIAGNOSIS — Z91128 Patient's intentional underdosing of medication regimen for other reason: Secondary | ICD-10-CM | POA: Diagnosis not present

## 2015-06-29 DIAGNOSIS — Z794 Long term (current) use of insulin: Secondary | ICD-10-CM | POA: Diagnosis not present

## 2015-06-29 DIAGNOSIS — I5033 Acute on chronic diastolic (congestive) heart failure: Secondary | ICD-10-CM | POA: Diagnosis not present

## 2015-06-29 DIAGNOSIS — R0602 Shortness of breath: Secondary | ICD-10-CM | POA: Diagnosis present

## 2015-06-29 DIAGNOSIS — Z944 Liver transplant status: Secondary | ICD-10-CM | POA: Diagnosis not present

## 2015-06-29 DIAGNOSIS — E1122 Type 2 diabetes mellitus with diabetic chronic kidney disease: Secondary | ICD-10-CM | POA: Insufficient documentation

## 2015-06-29 DIAGNOSIS — Z7982 Long term (current) use of aspirin: Secondary | ICD-10-CM | POA: Diagnosis not present

## 2015-06-29 DIAGNOSIS — R0600 Dyspnea, unspecified: Secondary | ICD-10-CM | POA: Diagnosis not present

## 2015-06-29 DIAGNOSIS — I120 Hypertensive chronic kidney disease with stage 5 chronic kidney disease or end stage renal disease: Secondary | ICD-10-CM | POA: Diagnosis not present

## 2015-06-29 DIAGNOSIS — Z9114 Patient's other noncompliance with medication regimen: Secondary | ICD-10-CM | POA: Diagnosis not present

## 2015-06-29 DIAGNOSIS — I10 Essential (primary) hypertension: Secondary | ICD-10-CM | POA: Diagnosis not present

## 2015-06-29 DIAGNOSIS — E1129 Type 2 diabetes mellitus with other diabetic kidney complication: Secondary | ICD-10-CM | POA: Diagnosis not present

## 2015-06-29 DIAGNOSIS — T501X6A Underdosing of loop [high-ceiling] diuretics, initial encounter: Secondary | ICD-10-CM | POA: Diagnosis present

## 2015-06-29 DIAGNOSIS — Z841 Family history of disorders of kidney and ureter: Secondary | ICD-10-CM | POA: Diagnosis not present

## 2015-06-29 DIAGNOSIS — D631 Anemia in chronic kidney disease: Secondary | ICD-10-CM | POA: Diagnosis present

## 2015-06-29 LAB — RENAL FUNCTION PANEL
ALBUMIN: 3.4 g/dL — AB (ref 3.5–5.0)
Anion gap: 16 — ABNORMAL HIGH (ref 5–15)
BUN: 76 mg/dL — AB (ref 6–20)
CO2: 24 mmol/L (ref 22–32)
CREATININE: 5.95 mg/dL — AB (ref 0.44–1.00)
Calcium: 8 mg/dL — ABNORMAL LOW (ref 8.9–10.3)
Chloride: 99 mmol/L — ABNORMAL LOW (ref 101–111)
GFR calc Af Amer: 7 mL/min — ABNORMAL LOW (ref >60)
GFR, EST NON AFRICAN AMERICAN: 6 mL/min — AB (ref >60)
GLUCOSE: 197 mg/dL — AB (ref 65–99)
PHOSPHORUS: 5.4 mg/dL — AB (ref 2.5–4.6)
Potassium: 4.1 mmol/L (ref 3.5–5.1)
Sodium: 139 mmol/L (ref 135–145)

## 2015-06-29 LAB — CREATININE, SERUM
CREATININE: 6.13 mg/dL — AB (ref 0.44–1.00)
GFR, EST AFRICAN AMERICAN: 7 mL/min — AB (ref >60)
GFR, EST NON AFRICAN AMERICAN: 6 mL/min — AB (ref >60)

## 2015-06-29 LAB — CBC
HCT: 25.5 % — ABNORMAL LOW (ref 36.0–46.0)
Hemoglobin: 8 g/dL — ABNORMAL LOW (ref 12.0–15.0)
MCH: 30.4 pg (ref 26.0–34.0)
MCHC: 31.4 g/dL (ref 30.0–36.0)
MCV: 97 fL (ref 78.0–100.0)
PLATELETS: 179 10*3/uL (ref 150–400)
RBC: 2.63 MIL/uL — ABNORMAL LOW (ref 3.87–5.11)
RDW: 14.1 % (ref 11.5–15.5)
WBC: 3.9 10*3/uL — ABNORMAL LOW (ref 4.0–10.5)

## 2015-06-29 LAB — GLUCOSE, CAPILLARY
GLUCOSE-CAPILLARY: 175 mg/dL — AB (ref 65–99)
GLUCOSE-CAPILLARY: 78 mg/dL (ref 65–99)
GLUCOSE-CAPILLARY: 171 mg/dL — AB (ref 65–99)
Glucose-Capillary: 92 mg/dL (ref 65–99)

## 2015-06-29 MED ORDER — FUROSEMIDE 10 MG/ML IJ SOLN
80.0000 mg | Freq: Two times a day (BID) | INTRAMUSCULAR | Status: DC
  Administered 2015-06-29 (×2): 80 mg via INTRAVENOUS
  Filled 2015-06-29 (×2): qty 8

## 2015-06-29 MED ORDER — FUROSEMIDE 10 MG/ML IJ SOLN
40.0000 mg | Freq: Once | INTRAMUSCULAR | Status: AC
  Administered 2015-06-29: 40 mg via INTRAVENOUS
  Filled 2015-06-29: qty 4

## 2015-06-29 MED ORDER — INSULIN ASPART 100 UNIT/ML ~~LOC~~ SOLN: 0.0000 [IU] | Freq: Every day | SUBCUTANEOUS | Status: DC

## 2015-06-29 MED ORDER — ASPIRIN 81 MG PO CHEW
81.0000 mg | CHEWABLE_TABLET | Freq: Every day | ORAL | Status: DC
  Administered 2015-06-29 – 2015-07-02 (×4): 81 mg via ORAL
  Filled 2015-06-29 (×4): qty 1

## 2015-06-29 MED ORDER — ISOSORBIDE MONONITRATE ER 30 MG PO TB24
30.0000 mg | ORAL_TABLET | Freq: Every day | ORAL | Status: DC
  Administered 2015-06-29 – 2015-07-02 (×4): 30 mg via ORAL
  Filled 2015-06-29 (×4): qty 1

## 2015-06-29 MED ORDER — FUROSEMIDE 10 MG/ML IJ SOLN
100.0000 mg | Freq: Three times a day (TID) | INTRAVENOUS | Status: DC
  Administered 2015-06-29 – 2015-07-01 (×4): 100 mg via INTRAVENOUS
  Filled 2015-06-29 (×7): qty 10

## 2015-06-29 MED ORDER — INSULIN ASPART 100 UNIT/ML ~~LOC~~ SOLN
0.0000 [IU] | Freq: Three times a day (TID) | SUBCUTANEOUS | Status: DC
  Administered 2015-06-29: 3 [IU] via SUBCUTANEOUS
  Administered 2015-06-30: 2 [IU] via SUBCUTANEOUS
  Administered 2015-06-30 – 2015-07-02 (×4): 3 [IU] via SUBCUTANEOUS

## 2015-06-29 MED ORDER — METOLAZONE 2.5 MG PO TABS: 2.5000 mg | ORAL_TABLET | ORAL | Status: DC

## 2015-06-29 MED ORDER — CALCITRIOL 0.25 MCG PO CAPS
0.2500 ug | ORAL_CAPSULE | ORAL | Status: DC
  Administered 2015-06-30 – 2015-07-02 (×2): 0.25 ug via ORAL
  Filled 2015-06-29 (×2): qty 1

## 2015-06-29 MED ORDER — RENA-VITE PO TABS
1.0000 | ORAL_TABLET | Freq: Every day | ORAL | Status: DC
  Administered 2015-06-29 – 2015-07-01 (×3): 1 via ORAL
  Filled 2015-06-29 (×3): qty 1

## 2015-06-29 MED ORDER — CARVEDILOL 12.5 MG PO TABS
12.5000 mg | ORAL_TABLET | Freq: Two times a day (BID) | ORAL | Status: DC
  Administered 2015-06-29 – 2015-07-02 (×7): 12.5 mg via ORAL
  Filled 2015-06-29 (×5): qty 1
  Filled 2015-06-29: qty 2
  Filled 2015-06-29: qty 1

## 2015-06-29 MED ORDER — SODIUM CHLORIDE 0.9% FLUSH
3.0000 mL | Freq: Two times a day (BID) | INTRAVENOUS | Status: DC
  Administered 2015-06-29 – 2015-07-02 (×7): 3 mL via INTRAVENOUS

## 2015-06-29 MED ORDER — INSULIN GLARGINE 100 UNIT/ML ~~LOC~~ SOLN
20.0000 [IU] | Freq: Every day | SUBCUTANEOUS | Status: DC
  Administered 2015-06-29 – 2015-07-01 (×3): 20 [IU] via SUBCUTANEOUS
  Filled 2015-06-29 (×4): qty 0.2

## 2015-06-29 MED ORDER — SODIUM CHLORIDE 0.9 % IV SOLN: 250.0000 mL | INTRAVENOUS | Status: DC | PRN

## 2015-06-29 MED ORDER — SODIUM CHLORIDE 0.9% FLUSH: 3.0000 mL | INTRAVENOUS | Status: DC | PRN

## 2015-06-29 MED ORDER — HEPARIN SODIUM (PORCINE) 5000 UNIT/ML IJ SOLN
5000.0000 [IU] | Freq: Three times a day (TID) | INTRAMUSCULAR | Status: DC
  Administered 2015-06-29 – 2015-07-02 (×9): 5000 [IU] via SUBCUTANEOUS
  Filled 2015-06-29 (×4): qty 1

## 2015-06-29 MED ORDER — ACETAMINOPHEN 325 MG PO TABS: 650.0000 mg | ORAL_TABLET | ORAL | Status: DC | PRN

## 2015-06-29 MED ORDER — ALBUTEROL SULFATE (2.5 MG/3ML) 0.083% IN NEBU
INHALATION_SOLUTION | RESPIRATORY_TRACT | Status: AC
  Filled 2015-06-29: qty 6

## 2015-06-29 MED ORDER — ONDANSETRON HCL 4 MG/2ML IJ SOLN: 4.0000 mg | Freq: Four times a day (QID) | INTRAMUSCULAR | Status: DC | PRN

## 2015-06-29 MED ORDER — ALBUTEROL SULFATE (2.5 MG/3ML) 0.083% IN NEBU
5.0000 mg | INHALATION_SOLUTION | Freq: Once | RESPIRATORY_TRACT | Status: AC
  Administered 2015-06-29: 5 mg via RESPIRATORY_TRACT
  Filled 2015-06-29: qty 6

## 2015-06-29 MED ORDER — HYDRALAZINE HCL 50 MG PO TABS
75.0000 mg | ORAL_TABLET | Freq: Three times a day (TID) | ORAL | Status: DC
  Administered 2015-06-29 – 2015-07-02 (×9): 75 mg via ORAL
  Filled 2015-06-29 (×9): qty 1

## 2015-06-29 MED ORDER — METOLAZONE 2.5 MG PO TABS
2.5000 mg | ORAL_TABLET | ORAL | Status: DC
  Administered 2015-07-01: 2.5 mg via ORAL
  Filled 2015-06-29 (×2): qty 1

## 2015-06-29 MED ORDER — ATORVASTATIN CALCIUM 40 MG PO TABS
40.0000 mg | ORAL_TABLET | Freq: Every day | ORAL | Status: DC
Start: 2015-06-29 — End: 2015-07-02
  Administered 2015-06-29 – 2015-07-01 (×3): 40 mg via ORAL
  Filled 2015-06-29 (×3): qty 1

## 2015-06-29 MED ORDER — AMLODIPINE BESYLATE 10 MG PO TABS
10.0000 mg | ORAL_TABLET | Freq: Every day | ORAL | Status: DC
  Administered 2015-06-29 – 2015-07-02 (×4): 10 mg via ORAL
  Filled 2015-06-29 (×4): qty 1

## 2015-06-29 NOTE — H&P (Signed)
[facility] Admission History and Physical Service Pager: [phone]  Patient name: Rachel Hobbs Medical record number: [id number] Date of birth: 1940/10/18 Age: 75 y.o. Gender: female  Primary Care Provider: Phill Myron, MD Consultants: Renal  Code Status: Full per discussion on admission  Chief Complaint: DOE  Assessment and Plan: [name] is a 75 y.o. female presenting with DOE . PMH is significant for CKD stage V, chronic diastolic heart failure, Y0DX, anemia of CKD, HTN, HLD, ventral hernia.   Dyspnea: most likely secondary to non-compliance with lasix dose in addition to worsening renal function. Patient's hemoglobin is slightly down to 8.3 from 9 in the setting of missing Epogen, however I doubt that subtle change would cause her significant clinical picture. Less likely infection as no prominent infiltrates on CXR, afebrile, without a leukocytosis, and other symptoms.  Patient's weight is up to 260lbs from around 253lbs which appears to be her baseline from chart review. She was diuresed with Lasix 14m IV in the ED.  - admit to telemetry, attending Dr. FRee Kida - Will increase to lasix 855mIV BID, however I suspect the patient may require doses of 120-16078miven her renal function.  - consider renal consult in the AM  - strict I&Os and daily weights - carb modified/renal diet with fluid restriction.   CKD stage V: Presumably from diabetes. She sees Dr. GolMoshe Cipro an outpatient.  Patient is not a candidate for an AVF and will need a AVGG. She unfortunately has not followed up with [facility] concerning this placement. No emergent need for HD: K 4.4, no metabolic acidosis, no uremia. Will attempt to remove fluid with diuresis.  - consider [facility] c/s per renal recommendations  - continue home calcitriol M/W/F - start renavite - will get a renal function panel to assess phosphorus  Chronic diastolic heart failure: last EF 60-65% with grade 2 diastolic  dysfunction on 6/28/3382he sees Dr. BenDanelle Earthly the outpatient setting.  Taking Lasix 160m52mM, coreg 12.5mg 58m,  metolazone 2.5mg e78my Tuesday and Saturday, Imdur 30mg d31m.  - repeat echo given it has been >1 year since previous echo  - continue coreg, metolazone, Imdur - increase Lasix to 80mg IV89m, I suspect she will need much higher doses.   T2DM: Last A1c 6.8 10/16. On Latnus 30units qAM as an outpatient - will decrease to lantus 20units - moderate SSI  - continue to monitor CBGs  Anemia: Hgb lower than baseline (9 to 8.2), however patient missed her Epogen injection on 05/22/15.  - continue to monitor - patient may be about to get injection (if still warranted) here with renal.    HTN: BPs slightly elevated, patient has not taken any BP medications this evening. On amlodipine and hydralazine at home.  - holding amlodipine, continue hydralazine  HLD: - continue home Lipotor   Abdominal wall hernia: Large hernia without tenderness, [facility] [place], consider imaging to evaluation for incarceration.   FEN/GI:  Carb modified/renal diet, IV saline lock.  Prophylaxis: Heparin SQ  Disposition: admit to telemetry, attending Dr. Fletke  Ree Kiday of Present Illness:  Rachel Hobbs y.o. 48male presenting with DOE since the morning of 3/11. No SOB at rest She's had a difficult time breathing when she lies supine which began 1 week ago.  She denies PND. No increased  swelling.  She started having a clear productive cough over the last week.  No chest pain, dizziness, urinary symptoms, dizziness, fevers, chills. No recent travels or prolonged sitting. No hemoptysis.   She is unsure what her baseline weight is and guesses around 240lbs. She does not weigh herself regularly.  Shes only been doing Lasix 163m in the morning (is supposed to do an additional 896min  the PM).  She states she drinks 4- 16ozs of water and soda per day. Denies increased sodium intake. Notes she has not been to see the vascular surgeon about dialysis grafts. She has also not been to short stay recent to get her monthly Epogen injections.   In the ED she was HDS. Her SCr was 6.13 with a GFR or 7, BUN of 76, and K of 4.4. She was given 1 dose of Lasix 4062mV and an albuterol treatment.  Due to concerns for "anasarca," FPTS was asked that admit.    Review Of Systems: Per HPI with the following additions: Shes noted a big "tumor or something or hernia" on her left side that has been bothering her for several years.  Otherwise the remainder of the systems were negative.  Patient Active Problem List   Diagnosis Date Noted  . Decreased dorsalis pedis pulse 11/22/2014  . CKD (chronic kidney disease), stage V (HCCT[place]3/15/2016  . Right hip pain 10/29/2013  . Chronic constipation 08/13/2013  . Chronic venous insufficiency 12/21/2012  . Chronic diastolic heart failure (HCCWoods Bay7/02/2013  . Nodule of soft tissue 06/22/2012  . Nasal congestion 06/22/2012  . Diastolic CHF (HCCBaudette3/80/99/8338 End stage renal disease (HCCKotzeb[place]2/25/2013  . Dyspnea 05/11/2011  . GOUT, ACUTE 11/03/2009  . FOOT PAIN, RIGHT 12/11/2008  . LEG EDEMA, BILATERAL 12/03/2008  . Other specified abdominal hernia without obstruction or gangrene 07/10/2007  . SMALL BOWEL OBSTRUCTION, HX OF 02/15/2007  . [facility]ROS[facility], [facility] N[facility], [facility] 12/08/2006  . Carpal tunnel syndrome 08/17/2006  . GLAUCOMA 08/17/2006  . HAIR LOSS 08/17/2006  . DM type 2, controlled, with complication (HCCWarsaw2/25/08/3974 HLD (hyperlipidemia) 06/16/2006  . Morbid obesity (HCCFairforest2/28/2008  . ANEMIA, IRON DEFICIENCY, UNSPEC. 06/16/2006  . HYPERTENSION, BENIGN SYSTEMIC 06/16/2006  . EDEMA-LEGS,DUE TO VENOUS OBSTRUCT. 06/16/2006    Past Medical History: Past Medical History  Diagnosis Date  . Depression   . Hyperlipidemia   .  Hypertension   . Diabetes mellitus   . Hernia   . Renal disorder     renal insufficiency    Past Surgical History: Past Surgical History  Procedure Laterality Date  . Abdominal hysterectomy      in the 70's  . Hernia repair      umbilical in the 70'73'A Social History: Social History  Substance Use Topics  . Smoking status: Never Smoker   . Smokeless tobacco: Never Used  . Alcohol Use: No   Additional social history: no alcohol, drug, or tobacco use  Please also refer to relevant sections of EMR.  Family History: Family History  Problem Relation Age of Onset  . Kidney disease Mother   . Hypertension Mother   . COPD Father     smoke  . Cancer Father     Lung  . Hypertension Father   . Cancer Brother     prostate  . Kidney disease Sister    Allergies and Medications: No Known Allergies No current facility-administered medications on file prior to encounter.  Current Outpatient Prescriptions on File Prior to Encounter  Medication Sig Dispense Refill  . acetaminophen (TYLENOL) 500 MG tablet Take 1,000 mg by mouth every 6 (six) hours as needed for pain.    Marland Kitchen amLODipine (NORVASC) 10 MG tablet Take 1 tablet (10 mg total) by mouth daily. 90 tablet 3  . aspirin 81 MG tablet Take 81 mg by mouth daily.      Marland Kitchen atorvastatin (LIPITOR) 40 MG tablet TAKE ONE TABLET BY MOUTH EVERY DAY FOR CHOLESTEROL 90 tablet 3  . Blood Glucose Monitoring Suppl (BLOOD GLUCOSE METER) kit Use as instructed 1 each 0  . calcitRIOL (ROCALTROL) 0.25 MCG capsule Take 0.25 mcg by mouth 3 (three) times a week. Take on Monday, Wednesday and Friday    . carvedilol (COREG) 12.5 MG tablet Take 12.5 mg by mouth 2 (two) times daily with a meal.    . cetirizine (ZYRTEC) 10 MG tablet Take 1 tablet (10 mg total) by mouth daily. 30 tablet 0  . cyclobenzaprine (FLEXERIL) 5 MG tablet Take 1 tablet (5 mg total) by mouth 3 (three) times daily as needed for muscle spasms. 30 tablet 0  . docusate sodium (COLACE) 100  MG capsule Take 100 mg by mouth at bedtime.     . furosemide (LASIX) 80 MG tablet TAKE 2 TABLETS BY MOUTH EVERY MORNING AND 1 TABLET EVERY AFTERNOON 270 tablet 3  . glycerin adult (GLYCERIN ADULT) 2 G SUPP Place 1 suppository rectally once. 2 suppository 0  . hydrALAZINE (APRESOLINE) 50 MG tablet Take 1.5 tablets (75 mg total) by mouth 3 (three) times daily. 135 tablet 6  . HYDROcodone-acetaminophen (NORCO/VICODIN) 5-325 MG per tablet Take 1 tablet by mouth every 4 (four) hours as needed for moderate pain or severe pain. 15 tablet 0  . hydrOXYzine (ATARAX/VISTARIL) 25 MG tablet Take 1 tablet (25 mg total) by mouth every 8 (eight) hours as needed for itching. 15 tablet 0  . Insulin Pen Needle 31G X 8 MM MISC BD UltraFine III Pen Needles. For use with insulin pen device. Inject insulin 6 x daily 100 each 3  . isosorbide mononitrate (IMDUR) 30 MG 24 hr tablet Take 30 mg by mouth daily.    . Lancets (ONETOUCH ULTRASOFT) lancets Once daily testing plus prn for hypoglycemia 100 each 9  . LANTUS SOLOSTAR 100 UNIT/ML Solostar Pen INJECT 30 UNITS INTO THE SKIN EVERY MORNING 15 mL 2  . metolazone (ZAROXOLYN) 2.5 MG tablet TAKE 1 TABLET BY MOUTH EVERY TUESDAY AND SATURDAY 25 tablet 0  . Multiple Vitamins-Minerals (ONE DAILY WOMENS) TABS Take by mouth daily.      Marland Kitchen NEEDLE, DISP, 30 G (B-D DISP NEEDLE 30GX1") 30G X 1" MISC 1 each by Does not apply route daily. 100 each 2  . ONE TOUCH ULTRA TEST test strip TEST BLOOD SUGAR ONCE DAILY AND AS NEEDED FOR SYMPTOMS OF HYPOGLYCEMIA 100 each 11  . polyethylene glycol powder (GLYCOLAX/MIRALAX) powder Take 17 g by mouth daily. 850 g 1  . senna (SENOKOT) 8.6 MG TABS tablet Take 1-2 tablets (8.6-17.2 mg total) by mouth daily as needed for mild constipation. 15 each 0  . traMADol (ULTRAM) 50 MG tablet Take 1 tablet (50 mg total) by mouth every 6 (six) hours as needed. For neck pain 20 tablet 0  . triamcinolone cream (KENALOG) 0.1 % Apply 1 application topically 3 (three)  times daily. 60 g 0    Objective: BP 175/63 mmHg  Pulse 69  Temp(Src) 98.7 F (37.1 C) (Oral)  Resp 22  Ht _0  (1.575 m)  Wt 260 lb (117.935 kg)  BMI 47.54 kg/m2  SpO2 100% Exam: General: sitting up in bed in NAD. Non-toxic. On RA.  Eyes: Conjunctivae non-injected.  ENTM: Moist mucous membranes. Oropharynx clear. No nasal discharge.  Neck: Supple, no LAD Cardiovascular: RRR. No murmurs, rubs, or gallops noted. 1-2+ pitting edema up to knees bilaterally. No JVD noted but difficult given habitus Respiratory: No increased WOB. Faint crackles noted bilaterally, decreased air movement in the bases. On RA.  Abdomen: +BS, soft, non-distended, non-tender. She ha a large left ventral hernia that freely moves and is non-tender. MSK: Normal bulk and tone. No gross deformities noted.  Skin: lichenification of the shins bilaterally consistent with venous stasis.  Neuro: A&O x4. No gross neurologic deficits. No asterixis.  Psych:  Appropriate mood and affect.    Labs and Imaging: CBC BMET   Recent Labs Lab 06/28/15 1839  WBC 3.6*  HGB 8.3*  HCT 26.3*  PLT 185    Recent Labs Lab 06/28/15 1839  NA 139  K 4.4  CL 101  CO2 23  BUN 76*  CREATININE 6.13*  GLUCOSE 143*  CALCIUM 8.0*    itrop 0.02  EKG NSR, HR 68, QTc 462, no significant change from previous  Dg Chest 2 View  06/28/2015  CLINICAL DATA:  Shortness of breath, dry cough for 1 day EXAM: CHEST  2 VIEW COMPARISON:  01/05/2013 FINDINGS: Mild cardiomegaly. Small left pleural effusion with left base atelectasis. No confluent opacity on the right. No edema. No acute bony abnormality. IMPRESSION: Cardiomegaly. Small left pleural effusion with left base atelectasis. Electronically Signed   By: Rolm Baptise M.D.   On: 06/28/2015 19:51     Archie Patten, MD 06/29/2015, 1:05 AM PGY-2, [place] [facility] pager: [phone], text pages welcome

## 2015-06-29 NOTE — Consult Note (Addendum)
Renal Service Consult Note [facility]  Rachel Hobbs 06/29/2015 [place] D Requesting Physician:  Dr Ree Kida  Reason for Consult:  CKD pt with SOB HPI: The patient is a 75 y.o. year-old with hx of HL, HTN, DM2 and CKD presented yesterday with DOE and dry cough.  Having SOB, no fevers, cough or orthopnea.  Creat is 6.13, f/b [facility], they have talked about putting in a "fistula" but not done yet.  Denies severe fatigue, anorexia or N/V.  No CP, fevers, no abd pain, no diarrhea.  She is R handed.   In 2014 - creat 3.8- 5.0 In 2015 - creat 5.0- 6.5 In 2016 - creat 5.5- 6.5   Today - creat 6.13      Chart review: Oct 2008 - SBO/ incarc hernia, underwent LOA/ repair hernia. HTN, DM2, CKD3. Anemia, morbid obese Nov 2008 - wound dehiscence > underwent TAH and VH repair Nov 2009 - recurrent incarc hernia > underwent recur VH repair and LOA Mar 2013 - SOB/ vol overload. CHF / CKD w creat 3.1 > Rx with diuresis Jan 2014 - SOB, not taking lasix > diuresed, a/c CHF diast.  ECHO normal except G1DD.  HTN, DM. Stage IV CKD, f/b Dr Moshe Cipro, baseline creat 3.5- 4.8.     ROS  denies CP  no joint pain   no HA  no blurry vision  no rash  no diarrhea  no nausea/ vomiting  no dysuria  no difficulty voiding  no change in urine color    Past Medical History  Past Medical History  Diagnosis Date  . Depression   . Hyperlipidemia   . Hypertension   . Diabetes mellitus   . Hernia   . Renal disorder     renal insufficiency   Past Surgical History  Past Surgical History  Procedure Laterality Date  . Abdominal hysterectomy      in the 70's  . Hernia repair      umbilical in the 62'B   Family History  Family History  Problem Relation Age of Onset  . Kidney disease Mother   . Hypertension Mother   . COPD Father     smoke  . Cancer Father     Lung  . Hypertension Father   . Cancer Brother     prostate  . Kidney disease Sister    Social History  reports that  she has never smoked. She has never used smokeless tobacco. She reports that she does not drink alcohol or use illicit drugs. Allergies No Known Allergies Home medications Prior to Admission medications   Medication Sig Start Date End Date Taking? Authorizing Provider  acetaminophen (TYLENOL) 500 MG tablet Take 1,000 mg by mouth every 6 (six) hours as needed for pain.   Yes Historical Provider, MD  amLODipine (NORVASC) 10 MG tablet Take 1 tablet (10 mg total) by mouth daily. 12/22/11  Yes Cletus Gash, MD  aspirin 81 MG tablet Take 81 mg by mouth daily.     Yes Historical Provider, MD  atorvastatin (LIPITOR) 40 MG tablet TAKE ONE TABLET BY MOUTH EVERY DAY FOR CHOLESTEROL 10/28/14  Yes Olam Idler, MD  calcitRIOL (ROCALTROL) 0.25 MCG capsule Take 0.25 mcg by mouth 3 (three) times a week. Take on Monday, Wednesday and Friday   Yes Historical Provider, MD  carvedilol (COREG) 12.5 MG tablet Take 12.5 mg by mouth 2 (two) times daily with a meal. 12/22/11  Yes Cletus Gash, MD  docusate sodium (COLACE) 100 MG  capsule Take 100 mg by mouth at bedtime.    Yes Historical Provider, MD  furosemide (LASIX) 80 MG tablet TAKE 2 TABLETS BY MOUTH EVERY MORNING AND 1 TABLET EVERY AFTERNOON   Yes Shaune Pascal Bensimhon, MD  hydrALAZINE (APRESOLINE) 50 MG tablet Take 1.5 tablets (75 mg total) by mouth 3 (three) times daily. 02/19/13  Yes Jolaine Artist, MD  isosorbide mononitrate (IMDUR) 30 MG 24 hr tablet Take 30 mg by mouth daily.   Yes Historical Provider, MD  LANTUS SOLOSTAR 100 UNIT/ML Solostar Pen INJECT 30 UNITS INTO THE SKIN EVERY MORNING 03/10/15  Yes Olam Idler, MD  metolazone (ZAROXOLYN) 2.5 MG tablet TAKE 1 TABLET BY MOUTH EVERY TUESDAY AND SATURDAY 01/13/15  Yes Larey Dresser, MD  Multiple Vitamin (MULTIVITAMIN WITH MINERALS) TABS tablet Take 1 tablet by mouth daily.   Yes Historical Provider, MD  Blood Glucose Monitoring Suppl (BLOOD GLUCOSE METER) kit Use as instructed 05/09/13   Olam Idler, MD  cetirizine (ZYRTEC) 10 MG tablet Take 1 tablet (10 mg total) by mouth daily. Patient not taking: Reported on 06/29/2015 06/22/12   Carolin Guernsey, MD  cyclobenzaprine (FLEXERIL) 5 MG tablet Take 1 tablet (5 mg total) by mouth 3 (three) times daily as needed for muscle spasms. Patient not taking: Reported on 06/29/2015 10/29/13   Olin Hauser, DO  glycerin adult (GLYCERIN ADULT) 2 G SUPP Place 1 suppository rectally once. Patient not taking: Reported on 06/29/2015 10/29/13   Olin Hauser, DO  HYDROcodone-acetaminophen (NORCO/VICODIN) 5-325 MG per tablet Take 1 tablet by mouth every 4 (four) hours as needed for moderate pain or severe pain. Patient not taking: Reported on 06/29/2015 04/25/14   Clayton Bibles, PA-C  hydrOXYzine (ATARAX/VISTARIL) 25 MG tablet Take 1 tablet (25 mg total) by mouth every 8 (eight) hours as needed for itching. Patient not taking: Reported on 06/29/2015 12/25/13   Harden Mo, MD  Insulin Pen Needle 31G X 8 MM MISC BD UltraFine III Pen Needles. For use with insulin pen device. Inject insulin 6 x daily 12/06/14   Olam Idler, MD  Lancets [facility] ULTRASOFT) lancets Once daily testing plus prn for hypoglycemia 05/09/13   Olam Idler, MD  NEEDLE, DISP, 30 G (B-D DISP NEEDLE 30GX1") 30G X 1" MISC 1 each by Does not apply route daily. 05/03/14   Olam Idler, MD  ONE TOUCH ULTRA TEST test strip TEST BLOOD SUGAR ONCE DAILY AND AS NEEDED FOR SYMPTOMS OF HYPOGLYCEMIA 06/05/14   Olam Idler, MD  polyethylene glycol powder (GLYCOLAX/MIRALAX) powder Take 17 g by mouth daily. Patient not taking: Reported on 06/29/2015 08/13/13   Patrecia Pour, MD  senna (SENOKOT) 8.6 MG TABS tablet Take 1-2 tablets (8.6-17.2 mg total) by mouth daily as needed for mild constipation. Patient not taking: Reported on 06/29/2015 10/29/13   Olin Hauser, DO  traMADol (ULTRAM) 50 MG tablet Take 1 tablet (50 mg total) by mouth every 6 (six) hours as needed. For neck  pain Patient not taking: Reported on 06/29/2015 10/14/14   Billy Fischer, MD  triamcinolone cream (KENALOG) 0.1 % Apply 1 application topically 3 (three) times daily. Patient not taking: Reported on 06/29/2015 12/25/13   Harden Mo, MD   Liver Function Tests  Recent Labs Lab 06/29/15 0612  ALBUMIN 3.4*   No results for input(s): LIPASE, AMYLASE in the last 168 hours. CBC  Recent Labs Lab 06/28/15 1839 06/29/15 0612  WBC 3.6* 3.9*  HGB 8.3* 8.0*  HCT 26.3* 25.5*  MCV 96.7 97.0  PLT 185 677   Basic Metabolic Panel  Recent Labs Lab 06/28/15 1839 06/29/15 0612  NA 139 139  K 4.4 4.1  CL 101 99*  CO2 23 24  GLUCOSE 143* 197*  BUN 76* 76*  CREATININE 6.13* 6.13*  5.95*  CALCIUM 8.0* 8.0*  PHOS  --  5.4*    Filed Vitals:   06/29/15 0102 06/29/15 0102 06/29/15 0740 06/29/15 1653  BP: 175/63 175/63 168/65 161/54  Pulse: 69 69 71 65  Temp:   97.6 F (36.4 C) 97.5 F (36.4 C)  TempSrc:   Oral Oral  Resp: _0 Height:      Weight:   117.9 kg (259 lb 14.8 oz)   SpO2: 100% 100% 99% 98%   Exam BP 175/64  P69  R 22- 27  Temp 98.7  RA 100% No rash, cyanosis or gangrene Sclera anicteric, throat clear No jvd No distress, up in chair, morbid obesity Chest clear bilat RRR no MRG ABd soft obese ntnd no mass or ascites GU defer MS no joint effusion Ext trace ankle edema bilat NEuro alert ox 3, no asterixis  Na 139  K 4.1 CO2 24  BUN 76  Creat 5.95  Ca 8  Alb 3.4  Glu 197   AG 16  WBC 3.9  Hb 8.0  plt 179 EKG - NSR, no acute changes CXR - small L effusion, atx.  No pulm edema or PVC   Assessment: 1 CKD stage 4/5 - no uremic symptoms yet.  Does need perm access. Wants to do HD when the time comes.  F/B Dr Moshe [facility]ipro at [facility].  2 SOB - not grossly overloaded, no pulm edema, but agree with IV lasix as has some mild vol excess 3 DM on insulin 4 Morbid obesity 5 HTN bp's running on high side 6 Anemia Hb 8.0   Plan - increase lasix IV 100 tid.  Will call  [facility] on Monday regarding perm access placement. Vein map ordered.   Kelly Splinter MD [employer] pager [phone]    cell [phone] 06/29/2015, 6:39 PM

## 2015-06-29 NOTE — ED Provider Notes (Signed)
CSN: [id number]     Arrival date & time 06/28/15  1806 History  By signing my name below, I, Rowan Blase, attest that this documentation has been prepared under the direction and in the presence of Jaxon Mynhier, MD . Electronically Signed: Rowan Blase, Scribe. 06/29/2015. 1:05 AM.   Chief Complaint  Patient presents with  . Shortness of Breath   Patient is a 75 y.o. female presenting with shortness of breath. The history is provided by the patient. No language interpreter was used.  Shortness of Breath Severity:  Severe Onset quality:  Gradual Timing:  Constant Progression:  Worsening Chronicity:  Recurrent Context: activity   Relieved by:  Nothing Worsened by:  Nothing tried Ineffective treatments:  None tried Associated symptoms: cough   Associated symptoms: no chest pain, no diaphoresis, no fever, no rash, no syncope, no vomiting and no wheezing   Risk factors: no recent alcohol use    HPI Comments:  Rachel Hobbs is a 75 y.o. female with PMHx of HLD, HTN, DM, CHF and renal disorder who presents to the Emergency Department complaining of shortness of breath onset yesterday morning, worse with walking around. Pt reports associated productive cough and baseline leg swelling. No alleviating factors noted. She reports she is still making urine and her sugars have been fair at home. Pt states she only took Lasix once today. Pt denies use of breathing treatments at home, chest pain, fever, vomiting, diarrhea, rash, diaphoresis, or recent medication changes .  Past Medical History  Diagnosis Date  . Depression   . Hyperlipidemia   . Hypertension   . Diabetes mellitus   . Hernia   . Renal disorder     renal insufficiency   Past Surgical History  Procedure Laterality Date  . Abdominal hysterectomy      in the 70's  . Hernia repair      umbilical in the 25'K   Family History  Problem Relation Age of Onset  . Kidney disease Mother   . Hypertension Mother   . COPD  Father     smoke  . Cancer Father     Lung  . Hypertension Father   . Cancer Brother     prostate  . Kidney disease Sister    Social History  Substance Use Topics  . Smoking status: Never Smoker   . Smokeless tobacco: Never Used  . Alcohol Use: No   OB History    No data available     Review of Systems  Constitutional: Negative for fever and diaphoresis.  Respiratory: Positive for cough and shortness of breath. Negative for wheezing.   Cardiovascular: Positive for leg swelling. Negative for chest pain and syncope.  Gastrointestinal: Negative for vomiting and diarrhea.  Skin: Negative for rash.  All other systems reviewed and are negative.  Allergies  Review of patient's allergies indicates no known allergies.  Home Medications   Prior to Admission medications   Medication Sig Start Date End Date Taking? Authorizing Provider  acetaminophen (TYLENOL) 500 MG tablet Take 1,000 mg by mouth every 6 (six) hours as needed for pain.    Historical Provider, MD  amLODipine (NORVASC) 10 MG tablet Take 1 tablet (10 mg total) by mouth daily. 12/22/11   Cletus Gash, MD  aspirin 81 MG tablet Take 81 mg by mouth daily.      Historical Provider, MD  atorvastatin (LIPITOR) 40 MG tablet TAKE ONE TABLET BY MOUTH EVERY DAY FOR CHOLESTEROL 10/28/14   Olam Idler, MD  Blood Glucose Monitoring Suppl (BLOOD GLUCOSE METER) kit Use as instructed 05/09/13   James R Joyner, MD  calcitRIOL (ROCALTROL) 0.25 MCG capsule Take 0.25 mcg by mouth 3 (three) times a week. Take on Monday, Wednesday and Friday    Historical Provider, MD  carvedilol (COREG) 12.5 MG tablet Take 12.5 mg by mouth 2 (two) times daily with a meal. 12/22/11   Rachel Chamberlain, MD  cetirizine (ZYRTEC) 10 MG tablet Take 1 tablet (10 mg total) by mouth daily. 06/22/12   Angela J Oh Park, MD  cyclobenzaprine (FLEXERIL) 5 MG tablet Take 1 tablet (5 mg total) by mouth 3 (three) times daily as needed for muscle spasms. 10/29/13   Alexander J  Karamalegos, DO  docusate sodium (COLACE) 100 MG capsule Take 100 mg by mouth at bedtime.     Historical Provider, MD  furosemide (LASIX) 80 MG tablet TAKE 2 TABLETS BY MOUTH EVERY MORNING AND 1 TABLET EVERY AFTERNOON    Daniel R Bensimhon, MD  glycerin adult (GLYCERIN ADULT) 2 G SUPP Place 1 suppository rectally once. 10/29/13   Alexander J Karamalegos, DO  hydrALAZINE (APRESOLINE) 50 MG tablet Take 1.5 tablets (75 mg total) by mouth 3 (three) times daily. 02/19/13   Daniel R Bensimhon, MD  HYDROcodone-acetaminophen (NORCO/VICODIN) 5-325 MG per tablet Take 1 tablet by mouth every 4 (four) hours as needed for moderate pain or severe pain. 04/25/14   Emily West, PA-C  hydrOXYzine (ATARAX/VISTARIL) 25 MG tablet Take 1 tablet (25 mg total) by mouth every 8 (eight) hours as needed for itching. 12/25/13   David C Keller, MD  Insulin Pen Needle 31G X 8 MM MISC BD UltraFine III Pen Needles. For use with insulin pen device. Inject insulin 6 x daily 12/06/14   James R Joyner, MD  isosorbide mononitrate (IMDUR) 30 MG 24 hr tablet Take 30 mg by mouth daily.    Historical Provider, MD  Lancets (ONETOUCH ULTRASOFT) lancets Once daily testing plus prn for hypoglycemia 05/09/13   James R Joyner, MD  LANTUS SOLOSTAR 100 UNIT/ML Solostar Pen INJECT 30 UNITS INTO THE SKIN EVERY MORNING 03/10/15   James R Joyner, MD  metolazone (ZAROXOLYN) 2.5 MG tablet TAKE 1 TABLET BY MOUTH EVERY TUESDAY AND SATURDAY 01/13/15   Dalton S McLean, MD  Multiple Vitamins-Minerals (ONE DAILY WOMENS) TABS Take by mouth daily.      Historical Provider, MD  NEEDLE, DISP, 30 G (B-D DISP NEEDLE 30GX1") 30G X 1" MISC 1 each by Does not apply route daily. 05/03/14   James R Joyner, MD  ONE TOUCH ULTRA TEST test strip TEST BLOOD SUGAR ONCE DAILY AND AS NEEDED FOR SYMPTOMS OF HYPOGLYCEMIA 06/05/14   James R Joyner, MD  polyethylene glycol powder (GLYCOLAX/MIRALAX) powder Take 17 g by mouth daily. 08/13/13   Ryan B Grunz, MD  senna (SENOKOT) 8.6 MG TABS tablet  Take 1-2 tablets (8.6-17.2 mg total) by mouth daily as needed for mild constipation. 10/29/13   Alexander J Karamalegos, DO  traMADol (ULTRAM) 50 MG tablet Take 1 tablet (50 mg total) by mouth every 6 (six) hours as needed. For neck pain 10/14/14   James D Kindl, MD  triamcinolone cream (KENALOG) 0.1 % Apply 1 application topically 3 (three) times daily. 12/25/13   David C Keller, MD   BP 175/63 mmHg  Pulse 69  Temp(Src) 98.7 F (37.1 C) (Oral)  Resp 22  Ht 5' 2" (1.575 m)  Wt 260 lb (117.935 kg)  BMI 47.54 kg/m2  SpO2 100% Physical   Exam  Constitutional: She is oriented to person, place, and time. She appears well-developed and well-nourished.  HENT:  Head: Normocephalic and atraumatic.  Mouth/Throat: Oropharynx is clear and moist.  Mallampati Class 4  Eyes: EOM are normal. Pupils are equal, round, and reactive to light.  Neck: Normal range of motion. Neck supple.  Cardiovascular: Normal rate, regular rhythm and intact distal pulses.   occasional PVC  Pulmonary/Chest: She has no wheezes. She has rales (right and left but diminished at the left base).  Abdominal: Soft. Bowel sounds are normal. There is no tenderness. There is no rebound and no guarding.   large ventral hernia on left large defect; bowel freely reducable  Musculoskeletal: Normal range of motion. She exhibits edema.  Brawny edema to both knees  Neurological: She is alert and oriented to person, place, and time.  Skin: Skin is warm and dry. She is not diaphoretic.  stasis dermatitis  Psychiatric: She has a normal mood and affect.  Nursing note and vitals reviewed.  ED Course  Procedures  DIAGNOSTIC STUDIES:  Oxygen Saturation is 99% on RA, normal by my interpretation.    COORDINATION OF CARE:  12:39 AM Will order nebulizer treatment. Discussed treatment plan with pt at bedside and pt agreed to plan.  1:05 AM Dr Ree Kida admitted to inpatient medicine.  Labs Review Labs Reviewed  BASIC METABOLIC PANEL - Abnormal;  Notable for the following:    Glucose, Bld 143 (*)    BUN 76 (*)    Creatinine, Ser 6.13 (*)    Calcium 8.0 (*)    GFR calc non Af Amer 6 (*)    GFR calc Af Amer 7 (*)    All other components within normal limits  CBC - Abnormal; Notable for the following:    WBC 3.6 (*)    RBC 2.72 (*)    Hemoglobin 8.3 (*)    HCT 26.3 (*)    All other components within normal limits  I-STAT TROPOININ, ED    Imaging Review Dg Chest 2 View  06/28/2015  CLINICAL DATA:  Shortness of breath, dry cough for 1 day EXAM: CHEST  2 VIEW COMPARISON:  01/05/2013 FINDINGS: Mild cardiomegaly. Small left pleural effusion with left base atelectasis. No confluent opacity on the right. No edema. No acute bony abnormality. IMPRESSION: Cardiomegaly. Small left pleural effusion with left base atelectasis. Electronically Signed   By: Rolm Baptise M.D.   On: 06/28/2015 19:51   I have personally reviewed and evaluated these images and lab results as part of my medical decision-making.   EKG Interpretation   Date/Time:  Saturday June 28 2015 18:26:13 EST Ventricular Rate:  69 PR Interval:  170 QRS Duration: 76 QT Interval:  436 QTC Calculation: 467 R Axis:   35 Text Interpretation:  Normal sinus rhythm Low voltage QRS Abnormal ECG  Confirmed by [facility]  MD, Melisia Leming ([id number]) on 06/29/2015 12:54:26 AM       MDM   Final diagnoses:  None    Results for orders placed or performed during the hospital encounter of 97/67/34  Basic metabolic panel  Result Value Ref Range   Sodium 139 135 - 145 mmol/L   Potassium 4.4 3.5 - 5.1 mmol/L   Chloride 101 101 - 111 mmol/L   CO2 23 22 - 32 mmol/L   Glucose, Bld 143 (H) 65 - 99 mg/dL   BUN 76 (H) 6 - 20 mg/dL   Creatinine, Ser 6.13 (H) 0.44 - 1.00 mg/dL   Calcium 8.0 (  L) 8.9 - 10.3 mg/dL   GFR calc non Af Amer 6 (L) >60 mL/min   GFR calc Af Amer 7 (L) >60 mL/min   Anion gap 15 5 - 15  CBC  Result Value Ref Range   WBC 3.6 (L) 4.0 - 10.5 K/uL   RBC 2.72 (L) 3.87  - 5.11 MIL/uL   Hemoglobin 8.3 (L) 12.0 - 15.0 g/dL   HCT 26.3 (L) 36.0 - 46.0 %   MCV 96.7 78.0 - 100.0 fL   MCH 30.5 26.0 - 34.0 pg   MCHC 31.6 30.0 - 36.0 g/dL   RDW 14.3 11.5 - 15.5 %   Platelets 185 150 - 400 K/uL  I-stat troponin, ED (not at [facility])  Result Value Ref Range   Troponin i, poc 0.02 0.00 - 0.08 ng/mL   Comment 3           Dg Chest 2 View  06/28/2015  CLINICAL DATA:  Shortness of breath, dry cough for 1 day EXAM: CHEST  2 VIEW COMPARISON:  01/05/2013 FINDINGS: Mild cardiomegaly. Small left pleural effusion with left base atelectasis. No confluent opacity on the right. No edema. No acute bony abnormality. IMPRESSION: Cardiomegaly. Small left pleural effusion with left base atelectasis. Electronically Signed   By: Rolm Baptise M.D.   On: 06/28/2015 19:51    Medications  furosemide (LASIX) injection 40 mg (not administered)  albuterol (PROVENTIL) (2.5 MG/3ML) 0.083% nebulizer solution 5 mg (5 mg Nebulization Given 06/29/15 0049)     I personally performed the services described in this documentation, which was scribed in my presence. The recorded information has been reviewed and is accurate.      Veatrice Kells, MD 06/29/15 0110

## 2015-06-30 ENCOUNTER — Ambulatory Visit (HOSPITAL_COMMUNITY): Payer: Medicare Other

## 2015-06-30 DIAGNOSIS — N189 Chronic kidney disease, unspecified: Secondary | ICD-10-CM

## 2015-06-30 DIAGNOSIS — N185 Chronic kidney disease, stage 5: Secondary | ICD-10-CM

## 2015-06-30 DIAGNOSIS — R601 Generalized edema: Secondary | ICD-10-CM

## 2015-06-30 LAB — CBC
HCT: 26.2 % — ABNORMAL LOW (ref 36.0–46.0)
HEMOGLOBIN: 8.3 g/dL — AB (ref 12.0–15.0)
MCH: 30.5 pg (ref 26.0–34.0)
MCHC: 31.7 g/dL (ref 30.0–36.0)
MCV: 96.3 fL (ref 78.0–100.0)
PLATELETS: 179 10*3/uL (ref 150–400)
RBC: 2.72 MIL/uL — AB (ref 3.87–5.11)
RDW: 14 % (ref 11.5–15.5)
WBC: 3 10*3/uL — AB (ref 4.0–10.5)

## 2015-06-30 LAB — RENAL FUNCTION PANEL
ANION GAP: 13 (ref 5–15)
Albumin: 3.2 g/dL — ABNORMAL LOW (ref 3.5–5.0)
BUN: 77 mg/dL — ABNORMAL HIGH (ref 6–20)
CALCIUM: 7.9 mg/dL — AB (ref 8.9–10.3)
CHLORIDE: 101 mmol/L (ref 101–111)
CO2: 25 mmol/L (ref 22–32)
CREATININE: 6.09 mg/dL — AB (ref 0.44–1.00)
GFR, EST AFRICAN AMERICAN: 7 mL/min — AB (ref >60)
GFR, EST NON AFRICAN AMERICAN: 6 mL/min — AB (ref >60)
Glucose, Bld: 112 mg/dL — ABNORMAL HIGH (ref 65–99)
Phosphorus: 5.6 mg/dL — ABNORMAL HIGH (ref 2.5–4.6)
Potassium: 4.1 mmol/L (ref 3.5–5.1)
Sodium: 139 mmol/L (ref 135–145)

## 2015-06-30 LAB — IRON AND TIBC
Iron: 53 ug/dL (ref 28–170)
Saturation Ratios: 29 % (ref 10.4–31.8)
TIBC: 183 ug/dL — ABNORMAL LOW (ref 250–450)
UIBC: 130 ug/dL

## 2015-06-30 LAB — GLUCOSE, CAPILLARY
GLUCOSE-CAPILLARY: 107 mg/dL — AB (ref 65–99)
GLUCOSE-CAPILLARY: 170 mg/dL — AB (ref 65–99)
GLUCOSE-CAPILLARY: 181 mg/dL — AB (ref 65–99)
Glucose-Capillary: 128 mg/dL — ABNORMAL HIGH (ref 65–99)

## 2015-06-30 LAB — HEMOGLOBIN A1C
Hgb A1c MFr Bld: 6.5 % — ABNORMAL HIGH (ref 4.8–5.6)
Mean Plasma Glucose: 140 mg/dL

## 2015-06-30 LAB — FERRITIN: Ferritin: 365 ng/mL — ABNORMAL HIGH (ref 11–307)

## 2015-06-30 MED ORDER — DARBEPOETIN ALFA 100 MCG/0.5ML IJ SOSY
PREFILLED_SYRINGE | INTRAMUSCULAR | Status: AC
Start: 2015-06-30 — End: 2015-07-01
  Filled 2015-06-30: qty 0.5

## 2015-06-30 MED ORDER — DARBEPOETIN ALFA 100 MCG/0.5ML IJ SOSY
100.0000 ug | PREFILLED_SYRINGE | INTRAMUSCULAR | Status: DC
  Administered 2015-06-30: 100 ug via SUBCUTANEOUS

## 2015-06-30 NOTE — Progress Notes (Addendum)
Right  Upper Extremity Vein Map    Cephalic  Segment Diameter Depth Comment  1. Axilla 2.75mm mm   2. Mid upper arm mm mm Not seen  3. Above AC mm mm Not seen  4. In [facility] 1.56mm 3.37mm   5. Below AC mm mm Not seen  6. Mid forearm mm mm Not seen  7. Wrist mm mm    mm mm    mm mm    mm mm    Basilic  Segment Diameter Depth Comment  1. Axilla 2.37mm 6.29mm   2. Mid upper arm 2.1mm 4.33mm   3. Above AC 1.42mm 4.34mm   4. In [facility] 1.38mm 3.74mm   5. Below AC 1.13mm 2.63mm   6. Mid forearm 2.32mm 2.64mm   7. Wrist 2.67mm mm    mm mm    Left Upper Extremity Vein Map    Cephalic  Segment Diameter Depth Comment  1. Axilla 1.13mm 2.58mm   2. Mid upper arm 3.67mm 6.51mm   3. Above [facility] 3.49mm 2.30mm   4. In AC 3.45mm 3.77mm   5. Below AC 2.68mm 1.2mm   6. Mid forearm 2.16mm 2.50mm   7. Wrist 1.45mm mm    mm mm    Basilic  Segment Diameter Depth Comment  1. Axilla 3.45mm 34mm   2. Mid upper arm 2.53mm 58mm   3. Above AC 2.20mm 48mm   4. In AC 2.60mm 8.63mm   5. Below AC mm mm   6. Mid forearm 1.4mm mm   7. Wrist mm mm Not seen   mm mm    mm mm    mm mm     Myeasha Ballowe D, RVT 06/30/2015, 3:15 PM

## 2015-06-30 NOTE — Progress Notes (Signed)
Family Medicine Teaching Service Daily Progress Note Intern Pager: [phone]  Patient name: Rachel Hobbs Medical record number: [id number] Date of birth: Apr 29, 1940 Age: 75 y.o. Gender: female  Primary Care Provider: Phill Myron, MD Consultants: Nephrology  Code Status: FULL   Pt Overview and Major Events to Date:  3/12: Patient admitted for CHF exacerbation   Assessment and Plan: Rachel Hobbs is a 75 y.o. female presenting with DOE . PMH is significant for CKD stage V, chronic diastolic heart failure, 123456, anemia of CKD, HTN, HLD, ventral hernia.   Dyspnea: most likely secondary to non-compliance with lasix dose in addition to worsening renal function. Patient's hemoglobin is slightly down to 8.3 from 9 in the setting of missing Epogen, however I doubt that subtle change would cause her significant clinical picture. Less likely infection as no prominent infiltrates on CXR, afebrile, without a leukocytosis, and other symptoms. Patient's weight is up to 260lbs from around 253lbs which appears to be her baseline from chart review. She was diuresed with Lasix 40mg  IV in the ED.  - admit to telemetry, attending Dr. Ree Kida  - IV lasix increased to 100 mg TID per nephro  - consider renal consult in the AM  - strict I&Os and daily weights: 1.5 L UOP (0.5 mL/kg/hr) and weight: 259 lbs  - carb modified/renal diet with fluid restriction.   CKD stage V: Presumably from diabetes. She sees Dr. Moshe Cipro as an outpatient. Patient is not a candidate for an AVF and will need a AVGG. She unfortunately has not followed up with [facility] concerning this placement. No emergent need for HD: K 4.4, no metabolic acidosis, no uremia. Will attempt to remove fluid with diuresis.  - renal will call [facility] regarding perm access placement; vein map ordered  - continue home calcitriol M/W/F - start renavite  Chronic diastolic heart failure: last EF 60-65% with grade 2 diastolic dysfunction on Q000111Q. She sees Dr.  Danelle Earthly in the outpatient setting. Taking Lasix 160mg  AQM, coreg 12.5mg  BID, metolazone 2.5mg  every Tuesday and Saturday, Imdur 30mg  daily.  - repeat echo given it has been >1 year since previous echo  - continue coreg, metolazone, Imdur - increase Lasix IV to 100 mg TID   T2DM: Last A1c 6.8 10/16. On Latnus 30units qAM as an outpatient - will decrease to lantus 20units - moderate SSI  - continue to monitor CBGs  Anemia: Hgb lower than baseline (9 to 8.2), however patient missed her Epogen injection on 05/22/15.  - continue to monitor - patient may be about to get injection (if still warranted) here with renal.    HTN: Still slightly elevated. (0000000 systolic)  - holding amlodipine, continue hydralazine  HLD: - continue home Lipotor   Abdominal wall hernia: Large hernia without tenderness, has been evaluated by surgery in [facility] per report who noted she needed to go to [facility] or [place].  - if worsening pain, consider imaging to evaluation for incarceration.   FEN/GI: Carb modified/renal diet, IV saline lock.  Prophylaxis: Heparin SQ   Disposition: Home pending improvement in dyspnea and set up for HD outpatient   Subjective:  Feels like breathing is slightly better this AM. Has been keeping feet elevated.   Objective: Temp:  [97.5 F (36.4 C)-98.1 F (36.7 C)] 98 F (36.7 C) (03/13 0557) Pulse Rate:  [64-65] 65 (03/13 0557) Resp:  [18] 18 (03/13 0557) BP: (140-161)/(53-54) 157/53 mmHg (03/13 0557) SpO2:  [98 %-100 %] 100 % (03/13 0557) Physical Exam: General:  sitting in bedside chair with feet elevated, in NAD  Cardiovascular: RRR. No murmurs appreciated.  Respiratory: Crackles noted bilaterally. Decreased air movement at the bases. On RA. Extremities: 1-2+ pitting edema up to knees bilaterally.   Laboratory:  Recent Labs Lab 06/28/15 1839 06/29/15 0612 06/30/15 0728  WBC 3.6* 3.9* 3.0*  HGB 8.3* 8.0* 8.3*  HCT 26.3* 25.5* 26.2*   PLT 185 179 179    Recent Labs Lab 06/28/15 1839 06/29/15 0612 06/30/15 0728  NA 139 139 139  K 4.4 4.1 4.1  CL 101 99* 101  CO2 23 24 25   BUN 76* 76* 77*  CREATININE 6.13* 6.13*  5.95* 6.09*  CALCIUM 8.0* 8.0* 7.9*  GLUCOSE 143* 197* 112*   itrop 0.02  EKG NSR, HR 68, QTc 462, no significant change from previous  Imaging/Diagnostic Tests: Dg Chest 2 View  06/28/2015  CLINICAL DATA:  Shortness of breath, dry cough for 1 day EXAM: CHEST  2 VIEW COMPARISON:  01/05/2013 FINDINGS: Mild cardiomegaly. Small left pleural effusion with left base atelectasis. No confluent opacity on the right. No edema. No acute bony abnormality. IMPRESSION: Cardiomegaly. Small left pleural effusion with left base atelectasis. Electronically Signed   By: Rolm Baptise M.D.   On: 06/28/2015 19:51    Nicolette Bang, DO 06/30/2015, 9:11 AM PGY-1, [place] Intern pager: [phone], text pages welcome

## 2015-06-30 NOTE — Progress Notes (Signed)
Admit: 06/29/2015 LOS: 1  30F CKD5 nonuremic   Subjective:  Feels better this AM, less orthopnea, able to speak in full sentences Discussed proactive rationale for vascular access, pt willing to pursue  03/12 0701 - 03/13 0700 In: 360 [P.O.:360] Out: 1500 [Urine:1500]  Filed Weights   06/28/15 1827 06/29/15 0740  Weight: 117.935 kg (260 lb) 117.9 kg (259 lb 14.8 oz)    Scheduled Meds: . amLODipine  10 mg Oral Daily  . aspirin  81 mg Oral Daily  . atorvastatin  40 mg Oral q1800  . calcitRIOL  0.25 mcg Oral Q M,W,F  . carvedilol  12.5 mg Oral BID WC  . furosemide  100 mg Intravenous 3 times per day  . heparin  5,000 Units Subcutaneous 3 times per day  . hydrALAZINE  75 mg Oral 3 times per day  . insulin aspart  0-15 Units Subcutaneous TID WC  . insulin aspart  0-5 Units Subcutaneous QHS  . insulin glargine  20 Units Subcutaneous QHS  . isosorbide mononitrate  30 mg Oral Daily  . [START ON 07/01/2015] metolazone  2.5 mg Oral Once per day on Tue Sat  . multivitamin  1 tablet Oral QHS  . sodium chloride flush  3 mL Intravenous Q12H   Continuous Infusions:  PRN Meds:.sodium chloride, acetaminophen, ondansetron (ZOFRAN) IV, sodium chloride flush  Current Labs: reviewed    Physical Exam:  Blood pressure 155/52, pulse 66, temperature 98.9 F (37.2 C), temperature source Oral, resp. rate 18, height 5\' 2"  (1.575 m), weight 117.9 kg (259 lb 14.8 oz), SpO2 99 %. NAD, full sentences,nl WOB RRR Trace LEE Diminished in the bases No rashes/lesions  A 1. Nonuremic CKD5 2. A/C dCHF Exacerbation 3. Need for vascular access 4. Morbid Obesity 5. Anemia, Hb in 8s 6. DM2  P 1. Would cont diuretics at current dosing 2. Have called [facility] for eval for AVF or AVG; DOES NOT NEED TDC now; not critical to be done as inpatient either; vein mapping pending 3. Check Iron levels 4. Start ESA Aranesp 100 qMon Daily weights, Daily Renal Panel, Strict I/Os, Avoid nephrotoxins (NSAIDs, judicious  IV Contrast)   Pearson Grippe MD 06/30/2015, 12:29 PM   Recent Labs Lab 06/28/15 1839 06/29/15 0612 06/30/15 0728  NA 139 139 139  K 4.4 4.1 4.1  CL 101 99* 101  CO2 23 24 25   GLUCOSE 143* 197* 112*  BUN 76* 76* 77*  CREATININE 6.13* 6.13*  5.95* 6.09*  CALCIUM 8.0* 8.0* 7.9*  PHOS  --  5.4* 5.6*    Recent Labs Lab 06/28/15 1839 06/29/15 0612 06/30/15 0728  WBC 3.6* 3.9* 3.0*  HGB 8.3* 8.0* 8.3*  HCT 26.3* 25.5* 26.2*  MCV 96.7 97.0 96.3  PLT 185 179 179

## 2015-06-30 NOTE — Consult Note (Signed)
[facility]ist of [facility]  Patient name: Rachel Hobbs MRN: [id number] DOB: Jan 14, 1941 Sex: female  REASON FOR CONSULT: Permanent dialysis access  HPI: Rachel Hobbs is a 75 y.o. female, who  presents for permanent dialysis access. She is not yet on dialysis. The patient is right-handed. She's never had previous access procedures.  The patient presented to [facility] on 06/29/2015 with complaints of increasing shortness of breath and dry cough for 1 week. She is also complained of bilateral leg swelling. The patient denied any increased fatigue, anorexia, nausea and vomiting. She denies chest pain or chest palpitations.  She has past medical history of hypertension managed on multiple medications. She has diabetes on insulin. She has hyperlipidemia managed on a statin.  Past Medical History  Diagnosis Date  . Depression   . Hyperlipidemia   . Hypertension   . Diabetes mellitus   . Hernia   . Renal disorder     renal insufficiency    Family History  Problem Relation Age of Onset  . Kidney disease Mother   . Hypertension Mother   . COPD Father     smoke  . Cancer Father     Lung  . Hypertension Father   . Cancer Brother     prostate  . Kidney disease Sister     SOCIAL HISTORY: Social History   Social History  . Marital Status: Single    Spouse Name: N/A  . Number of Children: 1  . Years of Education: 12   Occupational History  . Retired- [employer]     Social History Main Topics  . Smoking status: Never Smoker   . Smokeless tobacco: Never Used  . Alcohol Use: No  . Drug Use: No  . Sexual Activity: No   Other Topics Concern  . Not on file   Social History Narrative   Health Care POA:    Emergency Contact: daugher, [name], [phone]   End of Life Plan:    Who lives with you: Lives with daughter- 1 story home   Any pets: none   Diet: Pt eats a variety of food but is not currently trying to limit calories or diet.    Exercise: Pt  does not have any regular exercise routine.    Seatbelts: Pt reports wearing seatbelt when in vehicle.    Hobbies: shopping, senior center, singing                      No Known Allergies  Current Facility-Administered Medications  Medication Dose Route Frequency Provider Last Rate Last Dose  . 0.9 %  sodium chloride infusion  250 mL Intravenous PRN Archie Patten, MD      . acetaminophen (TYLENOL) tablet 650 mg  650 mg Oral Q4H PRN Archie Patten, MD      . amLODipine (NORVASC) tablet 10 mg  10 mg Oral Daily Archie Patten, MD   10 mg at 06/30/15 1027  . aspirin chewable tablet 81 mg  81 mg Oral Daily Archie Patten, MD   81 mg at 06/30/15 1027  . atorvastatin (LIPITOR) tablet 40 mg  40 mg Oral q1800 Archie Patten, MD   40 mg at 06/29/15 1859  . calcitRIOL (ROCALTROL) capsule 0.25 mcg  0.25 mcg Oral Q M,W,F Archie Patten, MD   0.25 mcg at 06/30/15 1027  . carvedilol (COREG) tablet 12.5 mg  12.5 mg Oral BID WC Archie Patten, MD  12.5 mg at 06/30/15 0827  . Darbepoetin Alfa (ARANESP) injection 100 mcg  100 mcg Subcutaneous Q Mon-1800 Rexene Agent, MD      . furosemide (LASIX) 100 mg in dextrose 5 % 50 mL IVPB  100 mg Intravenous 3 times per day Roney Jaffe, MD   100 mg at 06/30/15 1500  . heparin injection 5,000 Units  5,000 Units Subcutaneous 3 times per day Archie Patten, MD   5,000 Units at 06/30/15 1500  . hydrALAZINE (APRESOLINE) tablet 75 mg  75 mg Oral 3 times per day Archie Patten, MD   75 mg at 06/30/15 1500  . insulin aspart (novoLOG) injection 0-15 Units  0-15 Units Subcutaneous TID WC Archie Patten, MD   2 Units at 06/30/15 1250  . insulin aspart (novoLOG) injection 0-5 Units  0-5 Units Subcutaneous QHS Archie Patten, MD   0 Units at 06/29/15 2258  . insulin glargine (LANTUS) injection 20 Units  20 Units Subcutaneous QHS Archie Patten, MD   20 Units at 06/29/15 2253  . isosorbide mononitrate (IMDUR) 24 hr tablet 30 mg  30 mg Oral Daily  Archie Patten, MD   30 mg at 06/30/15 1027  . [START ON 07/01/2015] metolazone (ZAROXOLYN) tablet 2.5 mg  2.5 mg Oral Once per day on Tue Sat Archie Patten, MD      . multivitamin (RENA-VIT) tablet 1 tablet  1 tablet Oral QHS Archie Patten, MD   1 tablet at 06/29/15 0100  . ondansetron (ZOFRAN) injection 4 mg  4 mg Intravenous Q6H PRN Archie Patten, MD      . sodium chloride flush (NS) 0.9 % injection 3 mL  3 mL Intravenous Q12H Archie Patten, MD   3 mL at 06/30/15 1028  . sodium chloride flush (NS) 0.9 % injection 3 mL  3 mL Intravenous PRN Archie Patten, MD        REVIEW OF SYSTEMS:  [X]  denotes positive finding, [ ]  denotes negative finding Cardiac  Comments:  Chest pain or chest pressure:    Shortness of breath upon exertion:    Short of breath when lying flat:    Irregular heart rhythm:        Vascular    Pain in calf, thigh, or hip brought on by ambulation:    Pain in feet at night that wakes you up from your sleep:     Blood clot in your veins:    Leg swelling:  x       Pulmonary    Oxygen at home:    Productive cough:     Wheezing:         Neurologic    Sudden weakness in arms or legs:     Sudden numbness in arms or legs:     Sudden onset of difficulty speaking or slurred speech:    Temporary loss of vision in one eye:     Problems with dizziness:         Gastrointestinal    Blood in stool:     Vomited blood:         Genitourinary    Burning when urinating:     Blood in urine:        Psychiatric    Major depression:         Hematologic    Bleeding problems:    Problems with blood clotting too easily:        Skin  Rashes or ulcers:        Constitutional    Fever or chills:      PHYSICAL EXAM: Filed Vitals:   06/29/15 1653 06/29/15 2300 06/30/15 0557 06/30/15 1026  BP: 161/54 140/53 157/53 155/52  Pulse: 65 64 65 66  Temp: 97.5 F (36.4 C) 98.1 F (36.7 C) 98 F (36.7 C) 98.9 F (37.2 C)  TempSrc: Oral Oral Oral Oral  Resp:  18 18 18 18   Height:      Weight:      SpO2: 98% 100% 100% 99%    GENERAL: The patient is a well-nourished, morbidly obese female, in no acute distress. The vital signs are documented above. CARDIAC: There is a regular rate and rhythm. No carotid bruits VASCULAR: 2+ radial and 2+ brachial pulses bilaterally. Feet are warm and well perfused bilaterally. PULMONARY: There is good air exchange bilaterally without wheezing or rales. MUSCULOSKELETAL: There are no major deformities or cyanosis. Bilateral lower extremity swelling. NEUROLOGIC: No focal weakness or paresthesias are detected. SKIN: There are no ulcers or rashes noted. PSYCHIATRIC: The patient has a normal affect.  DATA:  Bilateral upper extremity vein map 06/30/2015  Right upper extremity: Inadequate cephalic vein. Very small to marginal basilic vein. Left upper extremity: Adequate cephalic vein conduit. Marginal basilic vein.   MEDICAL ISSUES:  Chronic kidney disease stage V  The patient is not yet on dialysis. She is right-handed. On vein mapping, she has an adequate left  cephalic vein for fistula creation. Plan for left radial versus brachial cephalic fistula sometime this week. The patient is agreeable to proceed.  Restricted left upper extremity. Move IV over to right arm. Dr. Donnetta Hutching to evaluate patient.    Virgina Jock, PA-C [facility] [place]      I have examined the patient, reviewed and agree with above. Discussed access options. We'll plan later this week.  Curt Jews, MD 06/30/2015 6:31 PM

## 2015-06-30 NOTE — Care Management Note (Signed)
Case Management Note  Patient Details  Name: TAMURA TARNOWSKI MRN: [id number] Date of Birth: [birth year]  Subjective/Objective:               CM following for progression and d/c planning.      Action/Plan: 06/30/2015 Noted referral for [facility] services for CHF program. This CM will continue to follow and offer pt choices re [facility] CHF program as time of d/c approaches currently awaiting ongoing assessment of renal function.   Expected Discharge Date:                  Expected Discharge Plan:  [place]  In-House Referral:  NA  Discharge planning Services  CM Consult  Post Acute Care Choice:  Home Health Choice offered to:  Patient  DME Arranged:    DME Agency:     HH Arranged:  RN [place] Agency:     Status of Service:  In process, will continue to follow  Medicare Important Message Given:    Date Medicare IM Given:    Medicare IM give by:    Date Additional Medicare IM Given:    Additional Medicare Important Message give by:     If discussed at [place] of Stay Meetings, dates discussed:    Additional Comments:  Adron Bene, RN 06/30/2015, 10:29 AM

## 2015-06-30 NOTE — Progress Notes (Signed)
Peripheral IV moved to the right arm, restricted arm band placed on the left arm.

## 2015-07-01 ENCOUNTER — Inpatient Hospital Stay (HOSPITAL_COMMUNITY): Payer: Medicare Other

## 2015-07-01 DIAGNOSIS — I509 Heart failure, unspecified: Secondary | ICD-10-CM

## 2015-07-01 LAB — ECHOCARDIOGRAM COMPLETE
AV Mean grad: 9 mmHg
AV peak Index: 0.76
AVA: 1.71 cm2
AVPG: 21 mmHg
Ao pk vel: 0.56 m/s
CHL CUP AORTIC ROOT 2D: 29 mm
CHL CUP AV VALUE AREA INDEX: 0.74
CHL CUP AV VEL: 1.71
CHL CUP LA VOL 2D INDEX: 14.1 mL/m2
CHL CUP LA VOL 2D: 32.6 mL
CHL CUP LVOT SV INDEX: 38 mL/m2
CHL CUP MV DEC (S): 335 ms
DOP CAL AO MEAN VELOCITY: 137 cm/s
EWDT: 335 ms
FS: 29 % (ref 28–44)
IV/PV OW: 1.18
LA ID, A-P, ES: 36 mm
LA SIZE INDEX: 1.56 mm/m2
LAVOL: 49.3 mL
LAVOLIN: 21.4 mL/m2
LEFT ATRIUM END SYS DIAM: 36 mm
LV PW s: 17 mm
LV SIMPSON'S DISK: 71
LV dias vol: 70 mL (ref 46–106)
LV sys vol index: 9 mL/m2
LV sys vol: 70 mL — AB (ref 14–42)
LVDIAVOLIN: 30 mL/m2
LVIDD: 34 mm — AB (ref 3.5–6.0)
LVIDS: 24 mm — AB (ref 2.1–4.0)
LVOT MEAN VEL: 85.8 cm/s
LVOT SV: 88 mL
LVOT VTI: 28.1 cm
LVOT area: 3.14 cm2
LVOT peak grad rest: 7 mmHg
LVOT peak vel: 128 cm/s
LVOTD: 20 mm
LVOTVTI: 0.54 cm
MVPKEVEL: 0.9 cm/s
PW: 17 mm — AB (ref 0.6–1.1)
SV INDEX: 21.5 mL/m2
Single Plane A4C EF: 59 %
Stroke v: 50 ml
TAPSE: 32.3 mm
TDI e' lateral: 6.31 cm/s
TDI e' medial: 6.64 cm/s
TRMAXVEL: 248 cm/s
TV PEAK RV-RA GRADIENT: 25 cm/s
VTI: 51.6 cm
WEIGHTICAEL: 4032 [oz_av]

## 2015-07-01 LAB — GLUCOSE, CAPILLARY
GLUCOSE-CAPILLARY: 179 mg/dL — AB (ref 65–99)
GLUCOSE-CAPILLARY: 170 mg/dL — AB (ref 65–99)
Glucose-Capillary: 108 mg/dL — ABNORMAL HIGH (ref 65–99)
Glucose-Capillary: 155 mg/dL — ABNORMAL HIGH (ref 65–99)

## 2015-07-01 LAB — CBC
HCT: 27.3 % — ABNORMAL LOW (ref 36.0–46.0)
Hemoglobin: 8.7 g/dL — ABNORMAL LOW (ref 12.0–15.0)
MCH: 30.6 pg (ref 26.0–34.0)
MCHC: 31.9 g/dL (ref 30.0–36.0)
MCV: 96.1 fL (ref 78.0–100.0)
PLATELETS: 188 10*3/uL (ref 150–400)
RBC: 2.84 MIL/uL — ABNORMAL LOW (ref 3.87–5.11)
RDW: 14.2 % (ref 11.5–15.5)
WBC: 3.2 10*3/uL — AB (ref 4.0–10.5)

## 2015-07-01 LAB — RENAL FUNCTION PANEL
Albumin: 3.3 g/dL — ABNORMAL LOW (ref 3.5–5.0)
Anion gap: 15 (ref 5–15)
BUN: 79 mg/dL — AB (ref 6–20)
CHLORIDE: 99 mmol/L — AB (ref 101–111)
CO2: 26 mmol/L (ref 22–32)
CREATININE: 6.12 mg/dL — AB (ref 0.44–1.00)
Calcium: 8 mg/dL — ABNORMAL LOW (ref 8.9–10.3)
GFR calc Af Amer: 7 mL/min — ABNORMAL LOW (ref >60)
GFR calc non Af Amer: 6 mL/min — ABNORMAL LOW (ref >60)
Glucose, Bld: 111 mg/dL — ABNORMAL HIGH (ref 65–99)
Phosphorus: 6.1 mg/dL — ABNORMAL HIGH (ref 2.5–4.6)
Potassium: 4.2 mmol/L (ref 3.5–5.1)
Sodium: 140 mmol/L (ref 135–145)

## 2015-07-01 MED ORDER — FUROSEMIDE 80 MG PO TABS
120.0000 mg | ORAL_TABLET | Freq: Three times a day (TID) | ORAL | Status: DC
  Administered 2015-07-01 – 2015-07-02 (×4): 120 mg via ORAL
  Filled 2015-07-01 (×4): qty 1

## 2015-07-01 NOTE — Progress Notes (Signed)
  Echocardiogram 2D Echocardiogram has been performed.  Rachel Hobbs 07/01/2015, 10:16 AM

## 2015-07-01 NOTE — Progress Notes (Signed)
Admit: 06/29/2015 LOS: 2  76F CKD5 nonuremic with volume overload / CHF exacerbation  Subjective:  MOre improvement in SOB  Saw VVS, plan for AVF this week Good UOP still Labs stable  03/13 0701 - 03/14 0700 In: 360 [P.O.:360] Out: 1000 [Urine:1000]  Filed Weights   06/28/15 1827 06/29/15 0740 06/30/15 2029  Weight: 117.935 kg (260 lb) 117.9 kg (259 lb 14.8 oz) 114.306 kg (252 lb)    Scheduled Meds: . amLODipine  10 mg Oral Daily  . aspirin  81 mg Oral Daily  . atorvastatin  40 mg Oral q1800  . calcitRIOL  0.25 mcg Oral Q M,W,F  . carvedilol  12.5 mg Oral BID WC  . darbepoetin (ARANESP) injection - NON-DIALYSIS  100 mcg Subcutaneous Q Mon-1800  . furosemide  120 mg Oral TID  . heparin  5,000 Units Subcutaneous 3 times per day  . hydrALAZINE  75 mg Oral 3 times per day  . insulin aspart  0-15 Units Subcutaneous TID WC  . insulin aspart  0-5 Units Subcutaneous QHS  . insulin glargine  20 Units Subcutaneous QHS  . isosorbide mononitrate  30 mg Oral Daily  . metolazone  2.5 mg Oral Once per day on Tue Sat  . multivitamin  1 tablet Oral QHS  . sodium chloride flush  3 mL Intravenous Q12H   Continuous Infusions:  PRN Meds:.sodium chloride, acetaminophen, ondansetron (ZOFRAN) IV, sodium chloride flush  Current Labs: reviewed    Physical Exam:  Blood pressure 114/50, pulse 69, temperature 98.8 F (37.1 C), temperature source Oral, resp. rate 20, height 5\' 2"  (1.575 m), weight 114.306 kg (252 lb), SpO2 99 %. NAD, full sentences,nl WOB RRR Trace LEE Diminished in the bases No rashes/lesions  A 1. Nonuremic CKD5 2. A/C dCHF Exacerbation 3. Need for vascular access 4. Morbid Obesity 5. Anemia, Hb in 8s; Fe replete 3/14; on weekly aranesp started as inpatient 6. DM2  P 1. Change to PO Furosemide today, 120mg  TID 2. Appreciate VVS eval, AVF can be done inpt or outpt as needed this week 3. Will need close f/u with our office, sees [place] 4. Daily weights, Daily  Renal Panel, Strict I/Os, Avoid nephrotoxins (NSAIDs, judicious IV Contrast)   Pearson Grippe MD 07/01/2015, 11:10 AM   Recent Labs Lab 06/29/15 0612 06/30/15 0728 07/01/15 0558  NA 139 139 140  K 4.1 4.1 4.2  CL 99* 101 99*  CO2 24 25 26   GLUCOSE 197* 112* 111*  BUN 76* 77* 79*  CREATININE 6.13*  5.95* 6.09* 6.12*  CALCIUM 8.0* 7.9* 8.0*  PHOS 5.4* 5.6* 6.1*    Recent Labs Lab 06/29/15 0612 06/30/15 0728 07/01/15 0558  WBC 3.9* 3.0* 3.2*  HGB 8.0* 8.3* 8.7*  HCT 25.5* 26.2* 27.3*  MCV 97.0 96.3 96.1  PLT 179 179 188

## 2015-07-01 NOTE — Progress Notes (Signed)
Family Medicine Teaching Service Daily Progress Note Intern Pager: [phone]  Patient name: Rachel Hobbs Medical record number: [id number] Date of birth: 1940/05/06 Age: 75 y.o. Gender: female  Primary Care Provider: Phill Myron, MD Consultants: Nephrology  Code Status: FULL   Pt Overview and Major Events to Date:  3/12: Patient admitted for CHF exacerbation   Assessment and Plan: Rachel Hobbs is a 75 y.o. female presenting with DOE . PMH is significant for CKD stage V, chronic diastolic heart failure, 123456, anemia of CKD, HTN, HLD, ventral hernia.   Dyspnea: most likely secondary to non-compliance with lasix dose in addition to worsening renal function. Patient's hemoglobin is slightly down to 8.3 from 9 in the setting of missing Epogen, however I doubt that subtle change would cause her significant clinical picture. Less likely infection as no prominent infiltrates on CXR, afebrile, without a leukocytosis, and other symptoms. Patient's weight is up to 260lbs from around 253lbs which appears to be her baseline from chart review. She was diuresed with Lasix 40mg  IV in the ED.  - admit to telemetry, attending Dr. Ree Kida  - IV lasix decreased to 120 mg PO TID  - strict I&Os and daily weights: 1.0 L UOP (0.4 mL/kg/hr) and weight: 252 lbs (doubt 7 lb weight loss with only 1L of fluid recorded; weights were measured on different scales in the hospital)  -foley catheter placed to more strictly monitor output  - carb modified/renal diet with fluid restriction.  -PT consult   CKD stage V: Presumably from diabetes. She sees Dr. Moshe Cipro as an outpatient. Patient is not a candidate for an AVF and will need a AVGG. She unfortunately has not followed up with VVS concerning this placement. No emergent need for HD: K 4.4, no metabolic acidosis, no uremia. Will attempt to remove fluid with diuresis.  - renal will call VVS regarding perm access placement; vein map performed  -VSS plans for fistula  this week  - continue home calcitriol M/W/F - start renavite  Chronic diastolic heart failure: last EF 60-65% with grade 2 diastolic dysfunction on Q000111Q. She sees Dr. Danelle Earthly in the outpatient setting. Taking Lasix 160mg  AQM, coreg 12.5mg  BID, metolazone 2.5mg  every Tuesday and Saturday, Imdur 30mg  daily.  - repeat echo given it has been >1 year since previous echo  - continue coreg, metolazone, Imdur - lasix 120 mg PO TID   T2DM: Last A1c 6.8 10/16. On Latnus 30units qAM as an outpatient - will decrease to lantus 20units - moderate SSI  - continue to monitor CBGs  Anemia: Hgb lower than baseline (9 to 8.2), however patient missed her Epogen injection on 05/22/15.  - continue to monitor - patient may be about to get injection (if still warranted) here with renal.  -HgB 8.7 (3/14)   HTN: Still slightly elevated. (0000000 systolic)  - holding amlodipine, continue hydralazine  HLD: - continue home Lipotor   Abdominal wall hernia: Large hernia without tenderness, has been evaluated by surgery in [facility] per report who noted she needed to go to [facility] or [place].  - if worsening pain, consider imaging to evaluation for incarceration.   FEN/GI: Carb modified/renal diet, IV saline lock.  Prophylaxis: Heparin SQ   Disposition: Home pending improvement in dyspnea and set up for HD outpatient   Subjective:  Feels like breathing is slightly better this AM. Has been keeping feet elevated.   Objective: Temp:  [98 F (36.7 C)-98.9 F (37.2 C)] 98.6 F (37 C) (03/14 0556)  Pulse Rate:  [66-70] 70 (03/14 0556) Resp:  [18-23] 23 (03/14 0556) BP: (152-161)/(50-55) 161/55 mmHg (03/14 0556) SpO2:  [98 %-100 %] 99 % (03/14 0556) Weight:  [252 lb (114.306 kg)] 252 lb (114.306 kg) (03/13 2029) Physical Exam: General: sitting in bed in NAD  Cardiovascular: RRR. No murmurs appreciated.  Respiratory: Mild crackles noted bilaterally. Decreased air movement at the  bases. On RA. Extremities: 1-2+ pitting edema up to knees bilaterally.   Laboratory:  Recent Labs Lab 06/29/15 0612 06/30/15 0728 07/01/15 0558  WBC 3.9* 3.0* 3.2*  HGB 8.0* 8.3* 8.7*  HCT 25.5* 26.2* 27.3*  PLT 179 179 188    Recent Labs Lab 06/29/15 0612 06/30/15 0728 07/01/15 0558  NA 139 139 140  K 4.1 4.1 4.2  CL 99* 101 99*  CO2 24 25 26   BUN 76* 77* 79*  CREATININE 6.13*  5.95* 6.09* 6.12*  CALCIUM 8.0* 7.9* 8.0*  GLUCOSE 197* 112* 111*   itrop 0.02  EKG NSR, HR 68, QTc 462, no significant change from previous  Imaging/Diagnostic Tests: Dg Chest 2 View  06/28/2015  CLINICAL DATA:  Shortness of breath, dry cough for 1 day EXAM: CHEST  2 VIEW COMPARISON:  01/05/2013 FINDINGS: Mild cardiomegaly. Small left pleural effusion with left base atelectasis. No confluent opacity on the right. No edema. No acute bony abnormality. IMPRESSION: Cardiomegaly. Small left pleural effusion with left base atelectasis. Electronically Signed   By: Rolm Baptise M.D.   On: 06/28/2015 19:51    Nicolette Bang, DO 07/01/2015, 8:35 AM PGY-1, [place] Intern pager: [phone], text pages welcome

## 2015-07-01 NOTE — Progress Notes (Signed)
Inserted 14 Fr foley catheter per order, patient tolerated the procedure well.

## 2015-07-02 LAB — RENAL FUNCTION PANEL
ALBUMIN: 3.2 g/dL — AB (ref 3.5–5.0)
ANION GAP: 15 (ref 5–15)
BUN: 79 mg/dL — AB (ref 6–20)
CO2: 26 mmol/L (ref 22–32)
Calcium: 7.8 mg/dL — ABNORMAL LOW (ref 8.9–10.3)
Chloride: 97 mmol/L — ABNORMAL LOW (ref 101–111)
Creatinine, Ser: 6.19 mg/dL — ABNORMAL HIGH (ref 0.44–1.00)
GFR calc Af Amer: 7 mL/min — ABNORMAL LOW (ref >60)
GFR calc non Af Amer: 6 mL/min — ABNORMAL LOW (ref >60)
GLUCOSE: 137 mg/dL — AB (ref 65–99)
PHOSPHORUS: 5.8 mg/dL — AB (ref 2.5–4.6)
POTASSIUM: 3.7 mmol/L (ref 3.5–5.1)
SODIUM: 138 mmol/L (ref 135–145)

## 2015-07-02 LAB — GLUCOSE, CAPILLARY
Glucose-Capillary: 110 mg/dL — ABNORMAL HIGH (ref 65–99)
Glucose-Capillary: 173 mg/dL — ABNORMAL HIGH (ref 65–99)

## 2015-07-02 LAB — CBC
HEMATOCRIT: 26.3 % — AB (ref 36.0–46.0)
HEMOGLOBIN: 8.8 g/dL — AB (ref 12.0–15.0)
MCH: 32.1 pg (ref 26.0–34.0)
MCHC: 33.5 g/dL (ref 30.0–36.0)
MCV: 96 fL (ref 78.0–100.0)
Platelets: 174 10*3/uL (ref 150–400)
RBC: 2.74 MIL/uL — ABNORMAL LOW (ref 3.87–5.11)
RDW: 14.2 % (ref 11.5–15.5)
WBC: 4.4 10*3/uL (ref 4.0–10.5)

## 2015-07-02 MED ORDER — CALCIUM CARBONATE ANTACID 500 MG PO CHEW
1.0000 | CHEWABLE_TABLET | Freq: Three times a day (TID) | ORAL | Status: DC
  Administered 2015-07-02: 200 mg via ORAL
  Filled 2015-07-02: qty 1

## 2015-07-02 MED ORDER — CALCIUM CARBONATE ANTACID 500 MG PO CHEW: 1.0000 | CHEWABLE_TABLET | Freq: Three times a day (TID) | ORAL | Status: DC

## 2015-07-02 MED ORDER — FUROSEMIDE 40 MG PO TABS: 120.0000 mg | ORAL_TABLET | Freq: Two times a day (BID) | ORAL | Status: DC

## 2015-07-02 NOTE — Care Management Important Message (Signed)
Important Message  Patient Details  Name: Rachel Hobbs MRN: [id number] Date of Birth: 08-01-1940   Medicare Important Message Given:  Yes    Kamrynn Melott P [place] 07/02/2015, 11:09 AM

## 2015-07-02 NOTE — Progress Notes (Signed)
Discharge instructions reviewed with pt allowing time for questions; pt verbalized understanding. IV removed without issue. Foley removed and pt able to void following removal. Tele box removed and CCMD notified. Pt to leave unit via wheelchair accompanied by staff.

## 2015-07-02 NOTE — Plan of Care (Signed)
Problem: Food- and Nutrition-Related Knowledge Deficit (NB-1.1) Goal: Nutrition education Formal process to instruct or train a patient/client in a skill or to impart knowledge to help patients/clients voluntarily manage or modify food choices and eating behavior to maintain or improve health. Outcome: Completed/Met Date Met:  07/02/15 Nutrition Education Note  RD consulted for low sodium and low phosphorous diet. Provided "Eating Healthy with Kidney Disease" handout to patient. Reviewed food groups and provided written recommended serving sizes specifically determined for patient's current nutritional status.   Explained why diet restrictions are needed and provided lists of foods to limit/avoid that are high potassium, sodium, and phosphorus. Provided specific recommendations on safer alternatives of these foods. Strongly encouraged compliance of this diet. Discussed diabetic friendly drink options. Teach back method used.  Expect good compliance.  Body mass index is 46.08 kg/(m^2). Pt meets criteria for morbid obesity based on current BMI.  Current diet order is renal/carbohydrate modified, patient is consuming approximately 100% of meals at this time. Labs and medications reviewed. No further nutrition interventions warranted at this time. RD contact information provided. If additional nutrition issues arise, please re-consult RD.  Corrin Parker, MS, RD, LDN Pager # [phone] After hours/ weekend pager # [phone]

## 2015-07-02 NOTE — Progress Notes (Signed)
  [facility] Progress Note  Due to OR unavailability this week, patient is scheduled for left arm fistula on Monday 07/07/15 with Dr. Donnetta Hutching. Patient does not need to remain inpatient for this.   Virgina Jock, PA-C [facility] Office: [phone] Pager: [phone] 07/02/2015 10:23 AM

## 2015-07-02 NOTE — Discharge Summary (Signed)
[facility] Discharge Summary  Patient name: Rachel Hobbs Medical record number: [id number] Date of birth: 1941/04/03 Age: 75 y.o. Gender: female Date of Admission: 06/29/2015  Date of Discharge: 07/02/2015 Admitting Physician: Lupita Dawn, MD  Primary Care Provider: Phill Myron, MD Consultants: Nephrology, Vascular Surgery   Indication for Hospitalization: CHF Exacerbation   Discharge Diagnoses/Problem List:  Patient Active Problem List   Diagnosis Date Noted  . Anasarca 06/29/2015  . CKD (chronic kidney disease)   . Acute on chronic diastolic congestive heart failure ([place])   . Type 2 diabetes mellitus with stage 5 chronic kidney disease not on chronic dialysis, with long-term current use of insulin ([place])   . Essential hypertension   . Decreased dorsalis pedis pulse 11/22/2014  . CKD (chronic kidney disease), stage V ([place]) 07/02/2014  . Right hip pain 10/29/2013  . Chronic constipation 08/13/2013  . Chronic venous insufficiency 12/21/2012  . Chronic diastolic heart failure ([place]) 10/27/2012  . Nodule of soft tissue 06/22/2012  . Nasal congestion 06/22/2012  . Diastolic CHF ([place]) 38/25/0539  . End stage renal disease ([place]) 06/14/2011  . Dyspnea 05/11/2011  . GOUT, ACUTE 11/03/2009  . FOOT PAIN, RIGHT 12/11/2008  . LEG EDEMA, BILATERAL 12/03/2008  . Other specified abdominal hernia without obstruction or gangrene 07/10/2007  . SMALL BOWEL OBSTRUCTION, HX OF 02/15/2007  . OSTEOARTHROSIS, LOCAL NOS, OTHER [facility] SITE 12/08/2006  . Carpal tunnel syndrome 08/17/2006  . GLAUCOMA 08/17/2006  . HAIR LOSS 08/17/2006  . DM type 2, controlled, with complication ([place]) 76/73/4193  . HLD (hyperlipidemia) 06/16/2006  . Morbid obesity ([place]) 06/16/2006  . ANEMIA, IRON DEFICIENCY, UNSPEC. 06/16/2006  . HYPERTENSION, BENIGN SYSTEMIC 06/16/2006  . EDEMA-LEGS,DUE TO VENOUS OBSTRUCT. 06/16/2006    Disposition: Home   Discharge Condition: Improved   Discharge  Exam: General: sitting in bedside chair in NAD  Cardiovascular: RRR. No murmurs appreciated.  Respiratory: Occasional crackle appreciated otherwise clear. Decreased air movement at the bases likely 2/2 to body habitus. On RA.  Extremities: Trace LE edema, improved from admission.   Brief Hospital Course:  [name] is 75 y.o. female with PMH significant for CKD Stage V, Chronic Diastolic CHF, X9KW, anemia of CKD, HTN, HLD and ventral hernia who presented with 1 week history of increased dyspnea and cough.  Dyspnea: Thought to be from CHF exacerbation in the setting of lasix non-compliance. Patient was supposed to be taking 160 mg lasix in the morning and 80 mg in the evening but was skipping her evening dose. Her weight was about 7 lbs above her dry weight (dry weight=253 lbs) at admission. Not thought to be an infectious process as patient was afebrile,without a leukocytosis, and without prominent infiltrates on CXR. Aggressive diuresis with IV lasix was initiated. Patient had good UOP and her weight decreased. She was switched to PO lasix and continued to have good diuresis.   CKD Stage V: Per nephrology, there had been plans to set patient up for HD prior to this hospital admission. Nephrology was consulted during hospital course. There were no emergent needs for HD present. Vascular surgery was consulted. Vein mapping was performed and she is scheduled to have left arm fistula placed on Monday, March 20. She met with a dietician prior to discharge and nephrology's recommendations at time of discharge are below.   Issues for Follow Up:  1. Discharged with Lasix 120 mg BID. Please follow up on fluid status and adjust if needed.  2. Discuss using scale  at home to monitor for excess fluid.  3. Nephrology Recommendations: Follow low Na diet, start Tums with meals for high phosphate, continue ESA outpatient weekly.   Significant Procedures: None   Significant Labs and Imaging:   Recent  Labs Lab 06/30/15 0728 07/01/15 0558 07/02/15 0545  WBC 3.0* 3.2* 4.4  HGB 8.3* 8.7* 8.8*  HCT 26.2* 27.3* 26.3*  PLT 179 188 174    Recent Labs Lab 06/28/15 1839 06/29/15 0612 06/30/15 0728 07/01/15 0558 07/02/15 0545  NA 139 139 139 140 138  K 4.4 4.1 4.1 4.2 3.7  CL 101 99* 101 99* 97*  CO2 23 24 25 26 26   GLUCOSE 143* 197* 112* 111* 137*  BUN 76* 76* 77* 79* 79*  CREATININE 6.13* 6.13*  5.95* 6.09* 6.12* 6.19*  CALCIUM 8.0* 8.0* 7.9* 8.0* 7.8*  PHOS  --  5.4* 5.6* 6.1* 5.8*  ALBUMIN  --  3.4* 3.2* 3.3* 3.2*    Dg Chest 2 View  06/28/2015  CLINICAL DATA:  Shortness of breath, dry cough for 1 day EXAM: CHEST  2 VIEW COMPARISON:  01/05/2013 FINDINGS: Mild cardiomegaly. Small left pleural effusion with left base atelectasis. No confluent opacity on the right. No edema. No acute bony abnormality. IMPRESSION: Cardiomegaly. Small left pleural effusion with left base atelectasis. Electronically Signed   By: Rolm Baptise M.D.   On: 06/28/2015 19:51    Results/Tests Pending at Time of Discharge: None   Discharge Medications:    Medication List    STOP taking these medications        hydrOXYzine 25 MG tablet  Commonly known as:  ATARAX/VISTARIL     traMADol 50 MG tablet  Commonly known as:  ULTRAM     triamcinolone cream 0.1 %  Commonly known as:  KENALOG      TAKE these medications        acetaminophen 500 MG tablet  Commonly known as:  TYLENOL  Take 1,000 mg by mouth every 6 (six) hours as needed for pain.     amLODipine 10 MG tablet  Commonly known as:  NORVASC  Take 1 tablet (10 mg total) by mouth daily.     aspirin 81 MG tablet  Take 81 mg by mouth daily.     atorvastatin 40 MG tablet  Commonly known as:  LIPITOR  TAKE ONE TABLET BY MOUTH EVERY DAY FOR CHOLESTEROL     blood glucose meter kit and supplies  Use as instructed     calcitRIOL 0.25 MCG capsule  Commonly known as:  ROCALTROL  Take 0.25 mcg by mouth 3 (three) times a week. Take on  Monday, Wednesday and Friday     calcium carbonate 500 MG chewable tablet  Commonly known as:  TUMS - dosed in mg elemental calcium  Chew 1 tablet (200 mg of elemental calcium total) by mouth 3 (three) times daily with meals.     carvedilol 12.5 MG tablet  Commonly known as:  COREG  Take 12.5 mg by mouth 2 (two) times daily with a meal.     cetirizine 10 MG tablet  Commonly known as:  ZYRTEC  Take 1 tablet (10 mg total) by mouth daily.     cyclobenzaprine 5 MG tablet  Commonly known as:  FLEXERIL  Take 1 tablet (5 mg total) by mouth 3 (three) times daily as needed for muscle spasms.     docusate sodium 100 MG capsule  Commonly known as:  COLACE  Take 100 mg by mouth  at bedtime.     furosemide 40 MG tablet  Commonly known as:  LASIX  Take 3 tablets (120 mg total) by mouth 2 (two) times daily.     glycerin adult 2 g Supp  Commonly known as:  glycerin adult  Place 1 suppository rectally once.     hydrALAZINE 50 MG tablet  Commonly known as:  APRESOLINE  Take 1.5 tablets (75 mg total) by mouth 3 (three) times daily.     HYDROcodone-acetaminophen 5-325 MG tablet  Commonly known as:  NORCO/VICODIN  Take 1 tablet by mouth every 4 (four) hours as needed for moderate pain or severe pain.     Insulin Pen Needle 31G X 8 MM Misc  BD UltraFine III Pen Needles. For use with insulin pen device. Inject insulin 6 x daily     isosorbide mononitrate 30 MG 24 hr tablet  Commonly known as:  IMDUR  Take 30 mg by mouth daily.     LANTUS SOLOSTAR 100 UNIT/ML Solostar Pen  Generic drug:  Insulin Glargine  INJECT 30 UNITS INTO THE SKIN EVERY MORNING     metolazone 2.5 MG tablet  Commonly known as:  ZAROXOLYN  TAKE 1 TABLET BY MOUTH EVERY TUESDAY AND SATURDAY     multivitamin with minerals Tabs tablet  Take 1 tablet by mouth daily.     NEEDLE (DISP) 30 G 30G X 1" Misc  Commonly known as:  B-D DISP NEEDLE 30GX1"  1 each by Does not apply route daily.     ONE TOUCH ULTRA TEST test  strip  Generic drug:  glucose blood  TEST BLOOD SUGAR ONCE DAILY AND AS NEEDED FOR SYMPTOMS OF HYPOGLYCEMIA     onetouch ultrasoft lancets  Once daily testing plus prn for hypoglycemia     polyethylene glycol powder powder  Commonly known as:  GLYCOLAX/MIRALAX  Take 17 g by mouth daily.     senna 8.6 MG Tabs tablet  Commonly known as:  SENOKOT  Take 1-2 tablets (8.6-17.2 mg total) by mouth daily as needed for mild constipation.        Discharge Instructions: Please refer to Patient Instructions section of EMR for full details.  Patient was counseled important signs and symptoms that should prompt return to medical care, changes in medications, dietary instructions, activity restrictions, and follow up appointments.   Follow-Up Appointments:     Follow-up Information    Follow up with Louis Meckel, MD On 07/16/2015.   Specialty:  Nephrology   Why:  At 12:45pm   Contact information:   [place] [address] [phone]       Follow up with [facility]. Go on 07/04/2015.   Specialty:  Family Medicine   Why:  For Hospital Followup with Dr. Brett Albino at 10:30 am    Contact information:   [address] [id number] [place] [address] [place], DO 07/02/2015, 12:22 PM PGY-1, [place]

## 2015-07-02 NOTE — Discharge Instructions (Signed)
You were hospitalized for a heart failure exacerbation. Please take Lasix 120 mg twice a day until your follow up at the [facility] on Friday.   You are scheduled to have a left arm fistula (dialysis access) on Monday 07/07/15 with Dr. Donnetta Hutching.

## 2015-07-02 NOTE — Progress Notes (Signed)
Admit: 06/29/2015 LOS: 3  61F CKD5 nonuremic with volume overload / CHF exacerbation  Subjective:  Feels well, some ambulation SCr stable Weight down, Responsive to PO lasix For AVF 3/20  03/14 0701 - 03/15 0700 In: 780 [P.O.:780] Out: 1300 [Urine:1300]  Filed Weights   06/29/15 0740 06/30/15 2029 07/02/15 0100  Weight: 117.9 kg (259 lb 14.8 oz) 114.306 kg (252 lb) 114.306 kg (252 lb)    Scheduled Meds: . amLODipine  10 mg Oral Daily  . aspirin  81 mg Oral Daily  . atorvastatin  40 mg Oral q1800  . calcitRIOL  0.25 mcg Oral Q M,W,F  . carvedilol  12.5 mg Oral BID WC  . darbepoetin (ARANESP) injection - NON-DIALYSIS  100 mcg Subcutaneous Q Mon-1800  . furosemide  120 mg Oral TID  . heparin  5,000 Units Subcutaneous 3 times per day  . hydrALAZINE  75 mg Oral 3 times per day  . insulin aspart  0-15 Units Subcutaneous TID WC  . insulin aspart  0-5 Units Subcutaneous QHS  . insulin glargine  20 Units Subcutaneous QHS  . isosorbide mononitrate  30 mg Oral Daily  . metolazone  2.5 mg Oral Once per day on Tue Sat  . multivitamin  1 tablet Oral QHS  . sodium chloride flush  3 mL Intravenous Q12H   Continuous Infusions:  PRN Meds:.sodium chloride, acetaminophen, ondansetron (ZOFRAN) IV, sodium chloride flush  Current Labs: reviewed    Physical Exam:  Blood pressure 143/49, pulse 70, temperature 98.4 F (36.9 C), temperature source Oral, resp. rate 18, height 5\' 2"  (1.575 m), weight 114.306 kg (252 lb), SpO2 98 %. NAD, full sentences,nl WOB RRR Improved LEE Diminished in the bases No rashes/lesions  A 1. Nonuremic CKD5 2. A/C dCHF Exacerbation; improving 3. Need for vascular access 4. Morbid Obesity 5. Anemia, Hb in 8s; Fe replete 3/14; on weekly aranesp started as inpatient 6. DM2 7. Hyperphosphatemia  P 1. For AVF 07/07/15 [facility] Early 2. Tolerating / Effective PO diuretics, labs stable 3. Clinically improved, no O2 needs, ambulating, less edema 4. Has f/u 07/16/15  with [facility] 5. Will arrange labs next week at our office 6. Need to follow low Na diet -- have dietician come by to discuss Na and Phos 7. Start Tums with each meal for high phos 8. Will cont to manage ESA as outpt 9. Daily weights, Daily Renal Panel, Strict I/Os, Avoid nephrotoxins (NSAIDs, judicious IV Contrast)   Will sign off for now.  Please call with any questions or concerns.   Pearson Grippe MD 07/02/2015, 10:40 AM   Recent Labs Lab 06/30/15 0728 07/01/15 0558 07/02/15 0545  NA 139 140 138  K 4.1 4.2 3.7  CL 101 99* 97*  CO2 25 26 26   GLUCOSE 112* 111* 137*  BUN 77* 79* 79*  CREATININE 6.09* 6.12* 6.19*  CALCIUM 7.9* 8.0* 7.8*  PHOS 5.6* 6.1* 5.8*    Recent Labs Lab 06/30/15 0728 07/01/15 0558 07/02/15 0545  WBC 3.0* 3.2* 4.4  HGB 8.3* 8.7* 8.8*  HCT 26.2* 27.3* 26.3*  MCV 96.3 96.1 96.0  PLT 179 188 174

## 2015-07-02 NOTE — Evaluation (Signed)
Physical Therapy Evaluation Patient Details Name: Rachel Hobbs MRN: [id number] DOB: 01/30/1941 Today's Date: 07/02/2015   History of Present Illness  Rachel Hobbs is a 75 y.o. female presenting with DOE . PMH is significant for CKD stage V, chronic diastolic heart failure, 123456, anemia of CKD, HTN, HLD, ventral hernia.  Clinical Impression  Pt admitted with above diagnosis. Pt currently with functional limitations due to the deficits listed below (see PT Problem List).  Pt will benefit from skilled PT to increase their independence and safety with mobility to allow discharge to the venue listed below.       Follow Up Recommendations Home health PT [facility] for chronic disease management)    Equipment Recommendations   (will consider RW)    Recommendations for Other Services       Precautions / Restrictions        Mobility  Bed Mobility                  Transfers Overall transfer level: Needs assistance Equipment used: Straight cane Transfers: Sit to/from Stand Sit to Stand: Min assist         General transfer comment: Min assist to steady  Ambulation/Gait Ambulation/Gait assistance: Min assist;Min guard Ambulation Distance (Feet): 45 Feet Assistive device: Straight cane Gait Pattern/deviations: Decreased step length - right;Decreased step length - left (Incr lateral translation of trunk)   Gait velocity interpretation: Below normal speed for age/gender General Gait Details: Walked with single point cane with noted inefficiient gait pattern, slow pace; Pt declines RW, but it will likely help her efficiency of walking  Stairs            Wheelchair Mobility    Modified Rankin (Stroke Patients Only)       Balance                                             Pertinent Vitals/Pain Pain Assessment: No/denies pain    Home Living Family/patient expects to be discharged to:: Private residence Living Arrangements: Children Available  Help at Discharge: Family;Available PRN/intermittently Type of Home: House Home Access: Stairs to enter Entrance Stairs-Rails: Psychiatric nurse of Steps: 3 (needs clarification) Home Layout: One level Home Equipment: Cane - single point      Prior Function Level of Independence: Independent with assistive device(s)               Hand Dominance        Extremity/Trunk Assessment   Upper Extremity Assessment: Overall WFL for tasks assessed           Lower Extremity Assessment: Generalized weakness         Communication   Communication: No difficulties  Cognition Arousal/Alertness: Awake/alert Behavior During Therapy: WFL for tasks assessed/performed Overall Cognitive Status: Within Functional Limits for tasks assessed                      General Comments      Exercises        Assessment/Plan    PT Assessment Patient needs continued PT services  PT Diagnosis Generalized weakness   PT Problem List Decreased activity tolerance;Decreased balance;Decreased mobility;Decreased knowledge of use of DME;Cardiopulmonary status limiting activity  PT Treatment Interventions DME instruction;Gait training;Stair training;Functional mobility training;Therapeutic activities;Therapeutic exercise;Balance training;Patient/family education   PT Goals (Current goals can be found in the  Care Plan section) Acute Rehab PT Goals Patient Stated Goal: hopes to get home soon PT Goal Formulation: With patient Time For Goal Achievement: 07/09/15 Potential to Achieve Goals: Good    Frequency Min 3X/week   Barriers to discharge        Co-evaluation               End of Session Equipment Utilized During Treatment: Gait belt Activity Tolerance: Patient tolerated treatment well Patient left: in chair;with call bell/phone within reach Nurse Communication: Mobility status         Time: [id number] PT Time Calculation (min) (ACUTE ONLY): 22  min   Charges:   PT Evaluation $PT Eval Low Complexity: 1 Procedure     PT G CodesQuin Hoop 07/02/2015, 4:22 PM  Roney Marion, PT  Acute Rehabilitation Services Pager [phone] Office [phone]

## 2015-07-04 ENCOUNTER — Ambulatory Visit (INDEPENDENT_AMBULATORY_CARE_PROVIDER_SITE_OTHER): Payer: Medicare Other | Admitting: Internal Medicine

## 2015-07-04 ENCOUNTER — Encounter: Payer: Self-pay | Admitting: Internal Medicine

## 2015-07-04 ENCOUNTER — Other Ambulatory Visit: Payer: Self-pay

## 2015-07-04 VITALS — BP 140/56 | HR 66 | Temp 98.2°F | Wt 245.3 lb

## 2015-07-04 DIAGNOSIS — J069 Acute upper respiratory infection, unspecified: Secondary | ICD-10-CM | POA: Diagnosis not present

## 2015-07-04 DIAGNOSIS — N185 Chronic kidney disease, stage 5: Secondary | ICD-10-CM

## 2015-07-04 DIAGNOSIS — I5032 Chronic diastolic (congestive) heart failure: Secondary | ICD-10-CM

## 2015-07-04 MED ORDER — FUROSEMIDE 40 MG PO TABS: ORAL_TABLET | ORAL | Status: DC

## 2015-07-04 MED ORDER — GUAIFENESIN ER 600 MG PO TB12: 600.0000 mg | ORAL_TABLET | Freq: Two times a day (BID) | ORAL | Status: DC

## 2015-07-04 NOTE — Assessment & Plan Note (Signed)
Pt with cough, rhinorrhea, congestion for 1 week - Prescribed Mucinex 600mg  bid

## 2015-07-04 NOTE — Assessment & Plan Note (Signed)
Pt to have fistula placed on 07/07/15.  - Per Nephro recommendations, is planning to start a low salt diet and is taking TUMS tid with meals - Follow-up with PCP in 1-3 months

## 2015-07-04 NOTE — Progress Notes (Signed)
[facility] Phone: [phone]  Subjective:  Hospital f/u for CHF exacerbation: Pt hospitalized for SOB thought to be secondary to CHF exacerbation due to not taking her Lasix as prescribed. She is supposed to be taking Lasix 140m bid. She states this is too high of a dose because she has been peeing a lot. She has been peeing every 2-3 hours. She is having some worsening of her urinary incontinence. She can't make it to the bathroom in time and urinates on herself. She is having incontinence about 50% of the time. No leakage of urine with coughing, sneezing, or laughing. She feels urine leak when she goes from sitting to standing. No dysuria, no fevers, no chills. Her shortness of breath is much improved. She denies dizziness. The swelling in her legs have gotten better.  Weight on admission to hospital was 258lb, weight on discharge was 252lb, weight today was 245lb.   CKD Stage V: She has not started a low salt diet because she has not had time to go to the grocery store. She has been taking Tums with meals. She has not had any problems with this. She will be getting a fistula placed on 3/20 in preparation for dialysis.  URI: Pt having cough, congestion, and rhinorrhea for the last week. No fever.  ROS: See HPI for pertinent positives and negatives Past Medical History- significant for chronic diastolic HF (last ECHO showing G1DD), CKD stage V Reviewed problem list.  Medications- reviewed and updated Current Outpatient Prescriptions  Medication Sig Dispense Refill  . acetaminophen (TYLENOL) 500 MG tablet Take 1,000 mg by mouth every 6 (six) hours as needed for pain.    .Marland KitchenamLODipine (NORVASC) 10 MG tablet Take 1 tablet (10 mg total) by mouth daily. 90 tablet 3  . aspirin 81 MG tablet Take 81 mg by mouth daily.      .Marland Kitchenatorvastatin (LIPITOR) 40 MG tablet TAKE ONE TABLET BY MOUTH EVERY DAY FOR CHOLESTEROL 90 tablet 3  . Blood Glucose Monitoring Suppl (BLOOD GLUCOSE  METER) kit Use as instructed 1 each 0  . calcitRIOL (ROCALTROL) 0.25 MCG capsule Take 0.25 mcg by mouth 3 (three) times a week. Take on Monday, Wednesday and Friday    . calcium carbonate (TUMS - DOSED IN MG ELEMENTAL CALCIUM) 500 MG chewable tablet Chew 1 tablet (200 mg of elemental calcium total) by mouth 3 (three) times daily with meals. 90 tablet 1  . carvedilol (COREG) 12.5 MG tablet Take 12.5 mg by mouth 2 (two) times daily with a meal.    . docusate sodium (COLACE) 100 MG capsule Take 100 mg by mouth at bedtime.     . furosemide (LASIX) 40 MG tablet Take 3 tablets (120 mg total) by mouth 2 (two) times daily. 180 tablet 0  . hydrALAZINE (APRESOLINE) 50 MG tablet Take 1.5 tablets (75 mg total) by mouth 3 (three) times daily. 135 tablet 6  . Insulin Pen Needle 31G X 8 MM MISC BD UltraFine III Pen Needles. For use with insulin pen device. Inject insulin 6 x daily 100 each 3  . isosorbide mononitrate (IMDUR) 30 MG 24 hr tablet Take 30 mg by mouth daily.    . Lancets (ONETOUCH ULTRASOFT) lancets Once daily testing plus prn for hypoglycemia 100 each 9  . LANTUS SOLOSTAR 100 UNIT/ML Solostar Pen INJECT 30 UNITS INTO THE SKIN EVERY MORNING 15 mL 2  . metolazone (ZAROXOLYN) 2.5 MG tablet TAKE 1 TABLET BY MOUTH EVERY TUESDAY AND SATURDAY  25 tablet 0  . Multiple Vitamin (MULTIVITAMIN WITH MINERALS) TABS tablet Take 1 tablet by mouth daily.    Marland Kitchen NEEDLE, DISP, 30 G (B-D DISP NEEDLE 30GX1") 30G X 1" MISC 1 each by Does not apply route daily. 100 each 2  . ONE TOUCH ULTRA TEST test strip TEST BLOOD SUGAR ONCE DAILY AND AS NEEDED FOR SYMPTOMS OF HYPOGLYCEMIA 100 each 11   No current facility-administered medications for this visit.   Chief complaint-noted Family history reviewed for today's visit. No changes. Social history- patient is a never smoker  Objective: BP 140/56 mmHg  Pulse 66  Temp(Src) 98.2 F (36.8 C) (Oral)  Wt 245 lb 4.8 oz (111.267 kg)  SpO2 99% Gen: NAD, alert, cooperative with  exam, walks slowly with a cane HEENT: NCAT, EOMI, MMM Neck: FROM, supple CV: RRR, no murmur Resp: CTABL, no wheezes, normal work of breathing Msk: Moves UE/LE spontaneously, 1+ pitting edema in lower extremities bilaterally Neuro: Alert and oriented, no gross deficits Skin: No rashes, no lesions Psych: Appropriate behavior  Assessment/Plan: Chronic diastolic CHF: Seen today for hospital f/u visit after acute exacerbation. Discharged on Lasix 167m bid. Has been compliant. On exam, lungs are clear with 1+ pitting edema in LE bilaterally. Pt states LE edema and SOB have greatly improved. She has been urinating a lot and would like to decrease her Lasix dose. She has had urinary incontinence. No orthostatic hypotension, BP 140/56 today. Previous dry weight = 253 lb, weight on admission to hospital = 258 lb, weight on discharge = 252 lb, weight in clinic today = 245 lb. - Will decrease Lasix dose to 1266min the morning and 8067mn the evening, as she has continued to lose weight since being discharged in the hospital and has had increased urinary incontinence - Advised Pt to call her heart failure doctor to make a follow-up appointment ASAP - Will get BMET today to check on Cr and K - Follow-up with PCP in 1-3 months.  CKD Stage V: Pt to have fistula placed on 07/07/15.  - Per Nephro recommendations, is planning to start a low salt diet and is taking TUMS tid with meals - Follow-up with PCP in 1-3 months  URI: - Prescribed Mucinex 600m21md  KatyHyman Bible PGY-1

## 2015-07-04 NOTE — Patient Instructions (Addendum)
It was so nice to meet you today!  We are changing your Lasix regimen to 120mg  (3 tablets) in the morning and 80mg  (2 tablets) at night. Please give your heart failure doctor a call to schedule an appointment ASAP.   Please continue to try to eat a low salt diet and take TUMS three times a day with meals.   I have sent in a prescription for a cold medicine called Mucinex into your pharmacy.  I will send you the results of your lab work today.  -Dr. Brett Albino

## 2015-07-04 NOTE — Assessment & Plan Note (Signed)
Seen today for hospital f/u visit after acute exacerbation. Discharged on Lasix 120mg  bid. Has been compliant. On exam, lungs are clear with 1+ pitting edema in LE bilaterally. Pt states LE edema and SOB have greatly improved. She has been urinating a lot and would like to decrease her Lasix dose. She has had urinary incontinence. No orthostatic hypotension, BP 140/56 today. Previous dry weight = 253 lb, weight on admission to hospital = 258 lb, weight on discharge = 252 lb, weight in clinic today = 245 lb. - Will decrease Lasix dose to 120mg  in the morning and 80mg  in the evening, as she has continued to lose weight since being discharged in the hospital and has had increased urinary incontinence - Advised Pt to call her heart failure doctor to make a follow-up appointment ASAP - Will get BMET today to check on Cr and K - Follow-up with PCP in 1-3 months.

## 2015-07-05 LAB — BASIC METABOLIC PANEL WITH GFR
BUN: 83 mg/dL — ABNORMAL HIGH (ref 7–25)
CHLORIDE: 96 mmol/L — AB (ref 98–110)
CO2: 26 mmol/L (ref 20–31)
Calcium: 8.3 mg/dL — ABNORMAL LOW (ref 8.6–10.4)
Creat: 6.15 mg/dL — ABNORMAL HIGH (ref 0.60–0.93)
GFR, EST NON AFRICAN AMERICAN: 6 mL/min — AB (ref >=60)
GFR, Est African American: 7 mL/min — ABNORMAL LOW (ref >=60)
Glucose, Bld: 134 mg/dL — ABNORMAL HIGH (ref 65–99)
POTASSIUM: 4 mmol/L (ref 3.5–5.3)
SODIUM: 138 mmol/L (ref 135–146)

## 2015-07-07 ENCOUNTER — Encounter: Payer: Self-pay | Admitting: Internal Medicine

## 2015-07-07 ENCOUNTER — Encounter (HOSPITAL_COMMUNITY): Admission: AD | Payer: Self-pay | Source: Ambulatory Visit

## 2015-07-07 ENCOUNTER — Ambulatory Visit (HOSPITAL_COMMUNITY): Admission: AD | Admit: 2015-07-07 | Payer: Medicare Other | Source: Ambulatory Visit | Admitting: Vascular Surgery

## 2015-07-07 SURGERY — ARTERIOVENOUS (AV) FISTULA CREATION
Anesthesia: Monitor Anesthesia Care | Laterality: Left

## 2015-07-07 MED ORDER — DEXTROSE 5 % IV SOLN
1.5000 g | INTRAVENOUS | Status: DC
  Filled 2015-07-07: qty 1.5

## 2015-07-07 MED ORDER — SODIUM CHLORIDE 0.9 % IV SOLN: INTRAVENOUS | Status: DC

## 2015-07-08 ENCOUNTER — Telehealth: Payer: Self-pay

## 2015-07-08 NOTE — Telephone Encounter (Signed)
Patient was scheduled for Left arm arteriovenous fistula creation with Dr. Donnetta Hutching on 07/07/15. Patient did not show up for this surgery; left a message, requesting that staff call her back. This nurse returned patient's phone call. Ms. Lemert states she does not want to proceed with fistula creation at this time. Instructed patient to call the office if she changes her mind and wants to proceed with surgery or if she has any other questions/concerns. Patient verbalized understanding. Relayed information to Dr. Donnetta Hutching, who is in the office today.

## 2015-07-16 DIAGNOSIS — D649 Anemia, unspecified: Secondary | ICD-10-CM | POA: Diagnosis not present

## 2015-07-16 DIAGNOSIS — N184 Chronic kidney disease, stage 4 (severe): Secondary | ICD-10-CM | POA: Diagnosis not present

## 2015-07-16 DIAGNOSIS — I129 Hypertensive chronic kidney disease with stage 1 through stage 4 chronic kidney disease, or unspecified chronic kidney disease: Secondary | ICD-10-CM | POA: Diagnosis not present

## 2015-07-16 DIAGNOSIS — N2581 Secondary hyperparathyroidism of renal origin: Secondary | ICD-10-CM | POA: Diagnosis not present

## 2015-07-16 DIAGNOSIS — E119 Type 2 diabetes mellitus without complications: Secondary | ICD-10-CM | POA: Diagnosis not present

## 2015-07-23 ENCOUNTER — Encounter (HOSPITAL_COMMUNITY)
Admission: RE | Admit: 2015-07-23 | Discharge: 2015-07-23 | Disposition: A | Discharge status: Home / Self Care | Payer: Medicare Other | Source: Ambulatory Visit | Attending: Nephrology | Admitting: Nephrology

## 2015-07-23 DIAGNOSIS — N184 Chronic kidney disease, stage 4 (severe): Secondary | ICD-10-CM | POA: Diagnosis not present

## 2015-07-23 DIAGNOSIS — D631 Anemia in chronic kidney disease: Secondary | ICD-10-CM | POA: Insufficient documentation

## 2015-07-23 LAB — RENAL FUNCTION PANEL
ALBUMIN: 3.7 g/dL (ref 3.5–5.0)
Anion gap: 17 — ABNORMAL HIGH (ref 5–15)
BUN: 95 mg/dL — AB (ref 6–20)
CALCIUM: 8.1 mg/dL — AB (ref 8.9–10.3)
CO2: 22 mmol/L (ref 22–32)
CREATININE: 6.61 mg/dL — AB (ref 0.44–1.00)
Chloride: 101 mmol/L (ref 101–111)
GFR, EST AFRICAN AMERICAN: 6 mL/min — AB (ref >60)
GFR, EST NON AFRICAN AMERICAN: 6 mL/min — AB (ref >60)
Glucose, Bld: 118 mg/dL — ABNORMAL HIGH (ref 65–99)
PHOSPHORUS: 5.9 mg/dL — AB (ref 2.5–4.6)
Potassium: 4.2 mmol/L (ref 3.5–5.1)
SODIUM: 140 mmol/L (ref 135–145)

## 2015-07-23 LAB — POCT HEMOGLOBIN-HEMACUE: Hemoglobin: 9.8 g/dL — ABNORMAL LOW (ref 12.0–15.0)

## 2015-07-23 LAB — IRON AND TIBC
Iron: 59 ug/dL (ref 28–170)
Saturation Ratios: 26 % (ref 10.4–31.8)
TIBC: 230 ug/dL — AB (ref 250–450)
UIBC: 171 ug/dL

## 2015-07-23 LAB — FERRITIN: Ferritin: 321 ng/mL — ABNORMAL HIGH (ref 11–307)

## 2015-07-23 MED ORDER — EPOETIN ALFA 20000 UNIT/ML IJ SOLN
INTRAMUSCULAR | Status: AC
  Filled 2015-07-23: qty 1

## 2015-07-23 MED ORDER — EPOETIN ALFA 20000 UNIT/ML IJ SOLN
20000.0000 [IU] | INTRAMUSCULAR | Status: DC
  Administered 2015-07-23: 20000 [IU] via SUBCUTANEOUS

## 2015-07-24 LAB — PTH, INTACT AND CALCIUM
Calcium, Total (PTH): 7.8 mg/dL — ABNORMAL LOW (ref 8.7–10.3)
PTH: 265 pg/mL — ABNORMAL HIGH (ref 15–65)

## 2015-07-25 ENCOUNTER — Other Ambulatory Visit: Payer: Self-pay | Admitting: Family Medicine

## 2015-07-28 ENCOUNTER — Other Ambulatory Visit (HOSPITAL_COMMUNITY): Payer: Self-pay | Admitting: *Deleted

## 2015-07-29 ENCOUNTER — Ambulatory Visit (HOSPITAL_COMMUNITY)
Admission: RE | Admit: 2015-07-29 | Discharge: 2015-07-29 | Disposition: A | Discharge status: Home / Self Care | Payer: Medicare Other | Source: Ambulatory Visit | Attending: Nephrology | Admitting: Nephrology

## 2015-07-29 DIAGNOSIS — D509 Iron deficiency anemia, unspecified: Secondary | ICD-10-CM | POA: Diagnosis not present

## 2015-07-29 MED ORDER — SODIUM CHLORIDE 0.9 % IV SOLN
510.0000 mg | INTRAVENOUS | Status: DC
  Administered 2015-07-29: 510 mg via INTRAVENOUS
  Filled 2015-07-29: qty 17

## 2015-08-04 ENCOUNTER — Encounter: Payer: Self-pay | Admitting: Family Medicine

## 2015-08-04 ENCOUNTER — Ambulatory Visit (INDEPENDENT_AMBULATORY_CARE_PROVIDER_SITE_OTHER): Payer: Medicare Other | Admitting: Family Medicine

## 2015-08-04 VITALS — BP 130/70 | HR 70 | Temp 98.6°F

## 2015-08-04 DIAGNOSIS — M109 Gout, unspecified: Secondary | ICD-10-CM | POA: Diagnosis not present

## 2015-08-04 MED ORDER — TRAMADOL HCL 50 MG PO TABS: 50.0000 mg | ORAL_TABLET | Freq: Four times a day (QID) | ORAL | Status: DC | PRN

## 2015-08-04 MED ORDER — PREDNISONE 20 MG PO TABS: 20.0000 mg | ORAL_TABLET | Freq: Every day | ORAL | Status: DC

## 2015-08-04 NOTE — Patient Instructions (Signed)
Thank you for coming in today!  - Take prednisone for the gout. Take 2 tablets every morning while your pain is severe. Once it resolves, just take 1 tablet in the morning for 3 days to taper off. This might make your blood pressure and blood sugar go up.  - You can also take tramadol AS NEEDED for severe pain.   Our clinic's number is [phone]. Feel free to call any time with questions or concerns. We will answer any questions after hours with our 24-hour emergency line at that number as well.   - Dr. Bonner Puna

## 2015-08-04 NOTE — Progress Notes (Signed)
Subjective: Rachel Hobbs is a 75 y.o. female with a history of gout, stage 5 CKD not on dialysis, and CHF presenting for painful great toe.   She notes 5 days of worsening, now severe, constant pain localized to the joint of the left big toe, worse with weight-bearing and any contact - to the point she's not wearing shoes and using a wheelchair for mobility (usually uses a cane). She denies trauma. No medications tried. Similar to previous gout-type pain  - ROS: No fevers, chills, red streaking, or other arthralgias/myalgias.   - Non-smoker  Objective: BP 130/70 mmHg  Pulse 70  Temp(Src) 98.6 F (37 C) (Oral)  Wt   SpO2 98% Gen: Pleasant 75 y.o. female in no distress Left foot: Induration from stasis dermatitis and 1+ pitting edema of LLE, symmetric with RLE. Left great toe exquisitely tender to palpation, mildly erythematous and swollen. Onychomycosis also noted with scaling of feet but no ulcers or skin breakdown. PT and DP pulse 2+  Assessment/Plan: Rachel Hobbs is a 75 y.o. female here for acute gout flare.  GOUT, ACUTE Has documented urate of 10 in 2010. No systemic symptoms to suggest septic joint. Advanced kidney disease precludes colchicine and NSAIDs, so will Rx prednisone burst, tramadol and tylenol prn pain. Urged to return if systemic symptoms develop.

## 2015-08-04 NOTE — Assessment & Plan Note (Signed)
Has documented urate of 10 in 2010. No systemic symptoms to suggest septic joint. Advanced kidney disease precludes colchicine and NSAIDs, so will Rx prednisone burst, tramadol and tylenol prn pain. Urged to return if systemic symptoms develop.

## 2015-08-05 ENCOUNTER — Other Ambulatory Visit (HOSPITAL_COMMUNITY): Payer: Self-pay | Admitting: *Deleted

## 2015-08-05 ENCOUNTER — Encounter (HOSPITAL_COMMUNITY): Payer: Medicare Other

## 2015-08-06 ENCOUNTER — Ambulatory Visit (HOSPITAL_COMMUNITY)
Admission: RE | Admit: 2015-08-06 | Discharge: 2015-08-06 | Disposition: A | Discharge status: Home / Self Care | Payer: Medicare Other | Source: Ambulatory Visit | Attending: Nephrology | Admitting: Nephrology

## 2015-08-06 DIAGNOSIS — D631 Anemia in chronic kidney disease: Secondary | ICD-10-CM | POA: Diagnosis not present

## 2015-08-06 DIAGNOSIS — Z79899 Other long term (current) drug therapy: Secondary | ICD-10-CM | POA: Diagnosis not present

## 2015-08-06 DIAGNOSIS — D509 Iron deficiency anemia, unspecified: Secondary | ICD-10-CM | POA: Diagnosis not present

## 2015-08-06 DIAGNOSIS — Z5181 Encounter for therapeutic drug level monitoring: Secondary | ICD-10-CM | POA: Insufficient documentation

## 2015-08-06 DIAGNOSIS — N184 Chronic kidney disease, stage 4 (severe): Secondary | ICD-10-CM | POA: Diagnosis not present

## 2015-08-06 LAB — POCT HEMOGLOBIN-HEMACUE: HEMOGLOBIN: 9.9 g/dL — AB (ref 12.0–15.0)

## 2015-08-06 MED ORDER — SODIUM CHLORIDE 0.9 % IV SOLN
510.0000 mg | Freq: Once | INTRAVENOUS | Status: AC
  Administered 2015-08-06: 510 mg via INTRAVENOUS
  Filled 2015-08-06: qty 17

## 2015-08-06 MED ORDER — EPOETIN ALFA 20000 UNIT/ML IJ SOLN
INTRAMUSCULAR | Status: AC
  Filled 2015-08-06: qty 1

## 2015-08-06 MED ORDER — EPOETIN ALFA 20000 UNIT/ML IJ SOLN
20000.0000 [IU] | INTRAMUSCULAR | Status: DC
  Administered 2015-08-06: 20000 [IU] via SUBCUTANEOUS

## 2015-08-11 ENCOUNTER — Ambulatory Visit: Payer: Medicare Other | Admitting: Sports Medicine

## 2015-08-12 ENCOUNTER — Encounter: Payer: Self-pay | Admitting: Vascular Surgery

## 2015-08-14 ENCOUNTER — Ambulatory Visit: Payer: Medicare Other | Admitting: Vascular Surgery

## 2015-08-15 ENCOUNTER — Other Ambulatory Visit: Payer: Self-pay | Admitting: Family Medicine

## 2015-08-20 ENCOUNTER — Encounter (HOSPITAL_COMMUNITY)
Admission: RE | Admit: 2015-08-20 | Discharge: 2015-08-20 | Disposition: A | Discharge status: Home / Self Care | Payer: Medicare Other | Source: Ambulatory Visit | Attending: Nephrology | Admitting: Nephrology

## 2015-08-20 DIAGNOSIS — D631 Anemia in chronic kidney disease: Secondary | ICD-10-CM | POA: Insufficient documentation

## 2015-08-20 DIAGNOSIS — N184 Chronic kidney disease, stage 4 (severe): Secondary | ICD-10-CM | POA: Diagnosis not present

## 2015-08-20 LAB — RENAL FUNCTION PANEL
Albumin: 3.6 g/dL (ref 3.5–5.0)
Anion gap: 17 — ABNORMAL HIGH (ref 5–15)
BUN: 103 mg/dL — ABNORMAL HIGH (ref 6–20)
CHLORIDE: 102 mmol/L (ref 101–111)
CO2: 22 mmol/L (ref 22–32)
CREATININE: 6.39 mg/dL — AB (ref 0.44–1.00)
Calcium: 7.5 mg/dL — ABNORMAL LOW (ref 8.9–10.3)
GFR, EST AFRICAN AMERICAN: 7 mL/min — AB (ref >60)
GFR, EST NON AFRICAN AMERICAN: 6 mL/min — AB (ref >60)
Glucose, Bld: 113 mg/dL — ABNORMAL HIGH (ref 65–99)
Phosphorus: 6.6 mg/dL — ABNORMAL HIGH (ref 2.5–4.6)
Potassium: 4.2 mmol/L (ref 3.5–5.1)
Sodium: 141 mmol/L (ref 135–145)

## 2015-08-20 LAB — IRON AND TIBC
IRON: 55 ug/dL (ref 28–170)
Saturation Ratios: 27 % (ref 10.4–31.8)
TIBC: 206 ug/dL — AB (ref 250–450)
UIBC: 151 ug/dL

## 2015-08-20 LAB — POCT HEMOGLOBIN-HEMACUE: Hemoglobin: 9.4 g/dL — ABNORMAL LOW (ref 12.0–15.0)

## 2015-08-20 LAB — FERRITIN: FERRITIN: 448 ng/mL — AB (ref 11–307)

## 2015-08-20 MED ORDER — EPOETIN ALFA 20000 UNIT/ML IJ SOLN
20000.0000 [IU] | INTRAMUSCULAR | Status: DC
  Administered 2015-08-20: 20000 [IU] via SUBCUTANEOUS

## 2015-08-20 MED ORDER — EPOETIN ALFA 20000 UNIT/ML IJ SOLN
INTRAMUSCULAR | Status: AC
  Filled 2015-08-20: qty 1

## 2015-08-21 LAB — PTH, INTACT AND CALCIUM
CALCIUM TOTAL (PTH): 7.5 mg/dL — AB (ref 8.7–10.3)
PTH: 238 pg/mL — AB (ref 15–65)

## 2015-08-26 ENCOUNTER — Encounter: Payer: Self-pay | Admitting: Sports Medicine

## 2015-08-26 ENCOUNTER — Ambulatory Visit (INDEPENDENT_AMBULATORY_CARE_PROVIDER_SITE_OTHER): Payer: Medicare Other | Admitting: Sports Medicine

## 2015-08-26 DIAGNOSIS — E1140 Type 2 diabetes mellitus with diabetic neuropathy, unspecified: Secondary | ICD-10-CM

## 2015-08-26 DIAGNOSIS — B351 Tinea unguium: Secondary | ICD-10-CM | POA: Diagnosis not present

## 2015-08-26 DIAGNOSIS — M79673 Pain in unspecified foot: Secondary | ICD-10-CM

## 2015-08-26 DIAGNOSIS — M79609 Pain in unspecified limb: Principal | ICD-10-CM

## 2015-08-26 DIAGNOSIS — Z794 Long term (current) use of insulin: Secondary | ICD-10-CM

## 2015-08-26 DIAGNOSIS — E1151 Type 2 diabetes mellitus with diabetic peripheral angiopathy without gangrene: Secondary | ICD-10-CM

## 2015-08-26 NOTE — Progress Notes (Signed)
Patient ID: Rachel Hobbs, female   DOB: 01-29-1941, 75 y.o.   MRN: [id number] Subjective: Rachel Hobbs is a 75 y.o. female patient with history of diabetes who presents to office today complaining of long, painful nails  while ambulating in shoes; unable to trim. Patient states that the glucose reading this morning was 200 mg/dl. Patient denies any new changes in medication or new problems. Patient denies any new cramping, numbness, burning or tingling in the legs.  Patient Active Problem List   Diagnosis Date Noted  . URI (upper respiratory infection) 07/04/2015  . Anasarca 06/29/2015  . Acute on chronic diastolic congestive heart failure ([place])   . Type 2 diabetes mellitus with stage 5 chronic kidney disease not on chronic dialysis, with long-term current use of insulin ([place])   . Essential hypertension   . Decreased dorsalis pedis pulse 11/22/2014  . Right hip pain 10/29/2013  . Chronic constipation 08/13/2013  . Chronic venous insufficiency 12/21/2012  . Chronic diastolic heart failure ([place]) 10/27/2012  . Nodule of soft tissue 06/22/2012  . Nasal congestion 06/22/2012  . Diastolic CHF ([place]) 01/60/1093  . End stage renal disease ([place]) 06/14/2011  . Dyspnea 05/11/2011  . GOUT, ACUTE 11/03/2009  . FOOT PAIN, RIGHT 12/11/2008  . LEG EDEMA, BILATERAL 12/03/2008  . Other specified abdominal hernia without obstruction or gangrene 07/10/2007  . SMALL BOWEL OBSTRUCTION, HX OF 02/15/2007  . OSTEOARTHROSIS, LOCAL NOS, OTHER [facility] SITE 12/08/2006  . Carpal tunnel syndrome 08/17/2006  . GLAUCOMA 08/17/2006  . HAIR LOSS 08/17/2006  . DM type 2, controlled, with complication ([place]) 23/55/7322  . HLD (hyperlipidemia) 06/16/2006  . Morbid obesity ([place]) 06/16/2006  . ANEMIA, IRON DEFICIENCY, UNSPEC. 06/16/2006  . HYPERTENSION, BENIGN SYSTEMIC 06/16/2006  . EDEMA-LEGS,DUE TO VENOUS OBSTRUCT. 06/16/2006   Current Outpatient Prescriptions on File Prior to Visit  Medication Sig Dispense Refill  .  acetaminophen (TYLENOL) 500 MG tablet Take 1,000 mg by mouth every 6 (six) hours as needed for pain.    Marland Kitchen amLODipine (NORVASC) 10 MG tablet Take 1 tablet (10 mg total) by mouth daily. 90 tablet 3  . aspirin 81 MG tablet Take 81 mg by mouth daily.      Marland Kitchen atorvastatin (LIPITOR) 40 MG tablet TAKE ONE TABLET BY MOUTH EVERY DAY FOR CHOLESTEROL 90 tablet 3  . Blood Glucose Monitoring Suppl (BLOOD GLUCOSE METER) kit Use as instructed 1 each 0  . calcitRIOL (ROCALTROL) 0.25 MCG capsule Take 0.25 mcg by mouth 3 (three) times a week. Take on Monday, Wednesday and Friday    . calcium carbonate (TUMS - DOSED IN MG ELEMENTAL CALCIUM) 500 MG chewable tablet Chew 1 tablet (200 mg of elemental calcium total) by mouth 3 (three) times daily with meals. 90 tablet 1  . carvedilol (COREG) 12.5 MG tablet Take 12.5 mg by mouth 2 (two) times daily with a meal.    . docusate sodium (COLACE) 100 MG capsule Take 100 mg by mouth at bedtime.     . furosemide (LASIX) 40 MG tablet Take 3 tablets ('120mg'$ ) in the morning and 2 tablets ('80mg'$ ) in the evening. 180 tablet 0  . guaiFENesin (MUCINEX) 600 MG 12 hr tablet Take 1 tablet (600 mg total) by mouth 2 (two) times daily. 10 tablet 0  . hydrALAZINE (APRESOLINE) 50 MG tablet Take 1.5 tablets (75 mg total) by mouth 3 (three) times daily. 135 tablet 6  . Insulin Pen Needle 31G X 8 MM MISC BD UltraFine III Pen Needles. For use with  insulin pen device. Inject insulin 6 x daily 100 each 3  . isosorbide mononitrate (IMDUR) 30 MG 24 hr tablet Take 30 mg by mouth daily.    . Lancets (ONETOUCH ULTRASOFT) lancets Once daily testing plus prn for hypoglycemia 100 each 9  . LANTUS SOLOSTAR 100 UNIT/ML Solostar Pen INJECT 30 UNITS INTO THE SKIN EVERY MORNING 15 mL 0  . Multiple Vitamin (MULTIVITAMIN WITH MINERALS) TABS tablet Take 1 tablet by mouth daily.    Marland Kitchen NEEDLE, DISP, 30 G (B-D DISP NEEDLE 30GX1") 30G X 1" MISC 1 each by Does not apply route daily. 100 each 2  . ONE TOUCH ULTRA TEST test  strip TEST BLOOD SUGAR EVERY DAY AND AS NEEDED FOR SYMPTOMS OF HYPOGLYCEMIA 100 each 3  . traMADol (ULTRAM) 50 MG tablet Take 1 tablet (50 mg total) by mouth every 6 (six) hours as needed (breakthrough pain). 30 tablet 0   No current facility-administered medications on file prior to visit.   No Known Allergies  Recent Results (from the past 2160 hour(s))  Basic metabolic panel     Status: Abnormal   Collection Time: 06/28/15  6:39 PM  Result Value Ref Range   Sodium 139 135 - 145 mmol/L   Potassium 4.4 3.5 - 5.1 mmol/L   Chloride 101 101 - 111 mmol/L   CO2 23 22 - 32 mmol/L   Glucose, Bld 143 (H) 65 - 99 mg/dL   BUN 76 (H) 6 - 20 mg/dL   Creatinine, Ser 6.13 (H) 0.44 - 1.00 mg/dL   Calcium 8.0 (L) 8.9 - 10.3 mg/dL   GFR calc non Af Amer 6 (L) >60 mL/min   GFR calc Af Amer 7 (L) >60 mL/min    Comment: (NOTE) The eGFR has been calculated using the CKD EPI equation. This calculation has not been validated in all clinical situations. eGFR's persistently <60 mL/min signify possible Chronic Kidney Disease.    Anion gap 15 5 - 15  CBC     Status: Abnormal   Collection Time: 06/28/15  6:39 PM  Result Value Ref Range   WBC 3.6 (L) 4.0 - 10.5 K/uL   RBC 2.72 (L) 3.87 - 5.11 MIL/uL   Hemoglobin 8.3 (L) 12.0 - 15.0 g/dL   HCT 26.3 (L) 36.0 - 46.0 %   MCV 96.7 78.0 - 100.0 fL   MCH 30.5 26.0 - 34.0 pg   MCHC 31.6 30.0 - 36.0 g/dL   RDW 14.3 11.5 - 15.5 %   Platelets 185 150 - 400 K/uL  I-stat troponin, ED (not at [facility], [facility])     Status: None   Collection Time: 06/28/15  7:05 PM  Result Value Ref Range   Troponin i, poc 0.02 0.00 - 0.08 ng/mL   Comment 3            Comment: Due to the release kinetics of cTnI, a negative result within the first hours of the onset of symptoms does not rule out myocardial infarction with certainty. If myocardial infarction is still suspected, repeat the test at appropriate intervals.   CBC     Status: Abnormal   Collection Time: 06/29/15  6:12 AM   Result Value Ref Range   WBC 3.9 (L) 4.0 - 10.5 K/uL   RBC 2.63 (L) 3.87 - 5.11 MIL/uL   Hemoglobin 8.0 (L) 12.0 - 15.0 g/dL   HCT 25.5 (L) 36.0 - 46.0 %   MCV 97.0 78.0 - 100.0 fL   MCH 30.4 26.0 - 34.0  pg   MCHC 31.4 30.0 - 36.0 g/dL   RDW 14.1 11.5 - 15.5 %   Platelets 179 150 - 400 K/uL  Renal function panel     Status: Abnormal   Collection Time: 06/29/15  6:12 AM  Result Value Ref Range   Sodium 139 135 - 145 mmol/L   Potassium 4.1 3.5 - 5.1 mmol/L   Chloride 99 (L) 101 - 111 mmol/L   CO2 24 22 - 32 mmol/L   Glucose, Bld 197 (H) 65 - 99 mg/dL   BUN 76 (H) 6 - 20 mg/dL   Creatinine, Ser 5.95 (H) 0.44 - 1.00 mg/dL   Calcium 8.0 (L) 8.9 - 10.3 mg/dL   Phosphorus 5.4 (H) 2.5 - 4.6 mg/dL   Albumin 3.4 (L) 3.5 - 5.0 g/dL   GFR calc non Af Amer 6 (L) >60 mL/min   GFR calc Af Amer 7 (L) >60 mL/min    Comment: (NOTE) The eGFR has been calculated using the CKD EPI equation. This calculation has not been validated in all clinical situations. eGFR's persistently <60 mL/min signify possible Chronic Kidney Disease.    Anion gap 16 (H) 5 - 15  Creatinine, serum     Status: Abnormal   Collection Time: 06/29/15  6:12 AM  Result Value Ref Range   Creatinine, Ser 6.13 (H) 0.44 - 1.00 mg/dL   GFR calc non Af Amer 6 (L) >60 mL/min   GFR calc Af Amer 7 (L) >60 mL/min    Comment: (NOTE) The eGFR has been calculated using the CKD EPI equation. This calculation has not been validated in all clinical situations. eGFR's persistently <60 mL/min signify possible Chronic Kidney Disease.   Glucose, capillary     Status: Abnormal   Collection Time: 06/29/15  7:52 AM  Result Value Ref Range   Glucose-Capillary 175 (H) 65 - 99 mg/dL  Hemoglobin A1c     Status: Abnormal   Collection Time: 06/29/15 11:29 AM  Result Value Ref Range   Hgb A1c MFr Bld 6.5 (H) 4.8 - 5.6 %    Comment: (NOTE)         Pre-diabetes: 5.7 - 6.4         Diabetes: >6.4         Glycemic control for adults with  diabetes: <7.0    Mean Plasma Glucose 140 mg/dL    Comment: (NOTE) Performed At: [facility] [place], [place] [id number] Lindon Romp MD [id number]   Glucose, capillary     Status: None   Collection Time: 06/29/15 11:52 AM  Result Value Ref Range   Glucose-Capillary 78 65 - 99 mg/dL  Glucose, capillary     Status: None   Collection Time: 06/29/15  4:50 PM  Result Value Ref Range   Glucose-Capillary 92 65 - 99 mg/dL  Glucose, capillary     Status: Abnormal   Collection Time: 06/29/15 10:11 PM  Result Value Ref Range   Glucose-Capillary 171 (H) 65 - 99 mg/dL  CBC     Status: Abnormal   Collection Time: 06/30/15  7:28 AM  Result Value Ref Range   WBC 3.0 (L) 4.0 - 10.5 K/uL   RBC 2.72 (L) 3.87 - 5.11 MIL/uL   Hemoglobin 8.3 (L) 12.0 - 15.0 g/dL   HCT 26.2 (L) 36.0 - 46.0 %   MCV 96.3 78.0 - 100.0 fL   MCH 30.5 26.0 - 34.0 pg   MCHC 31.7 30.0 - 36.0 g/dL   RDW  14.0 11.5 - 15.5 %   Platelets 179 150 - 400 K/uL  Renal function panel     Status: Abnormal   Collection Time: 06/30/15  7:28 AM  Result Value Ref Range   Sodium 139 135 - 145 mmol/L   Potassium 4.1 3.5 - 5.1 mmol/L   Chloride 101 101 - 111 mmol/L   CO2 25 22 - 32 mmol/L   Glucose, Bld 112 (H) 65 - 99 mg/dL   BUN 77 (H) 6 - 20 mg/dL   Creatinine, Ser 6.09 (H) 0.44 - 1.00 mg/dL   Calcium 7.9 (L) 8.9 - 10.3 mg/dL   Phosphorus 5.6 (H) 2.5 - 4.6 mg/dL   Albumin 3.2 (L) 3.5 - 5.0 g/dL   GFR calc non Af Amer 6 (L) >60 mL/min   GFR calc Af Amer 7 (L) >60 mL/min    Comment: (NOTE) The eGFR has been calculated using the CKD EPI equation. This calculation has not been validated in all clinical situations. eGFR's persistently <60 mL/min signify possible Chronic Kidney Disease.    Anion gap 13 5 - 15  Glucose, capillary     Status: Abnormal   Collection Time: 06/30/15  8:19 AM  Result Value Ref Range   Glucose-Capillary 107 (H) 65 - 99 mg/dL  Glucose, capillary     Status: Abnormal    Collection Time: 06/30/15 12:03 PM  Result Value Ref Range   Glucose-Capillary 128 (H) 65 - 99 mg/dL  Ferritin     Status: Abnormal   Collection Time: 06/30/15 12:57 PM  Result Value Ref Range   Ferritin 365 (H) 11 - 307 ng/mL  Iron and TIBC     Status: Abnormal   Collection Time: 06/30/15 12:57 PM  Result Value Ref Range   Iron 53 28 - 170 ug/dL   TIBC 183 (L) 250 - 450 ug/dL   Saturation Ratios 29 10.4 - 31.8 %   UIBC 130 ug/dL  Glucose, capillary     Status: Abnormal   Collection Time: 06/30/15  5:37 PM  Result Value Ref Range   Glucose-Capillary 170 (H) 65 - 99 mg/dL  Glucose, capillary     Status: Abnormal   Collection Time: 06/30/15  8:41 PM  Result Value Ref Range   Glucose-Capillary 181 (H) 65 - 99 mg/dL  CBC     Status: Abnormal   Collection Time: 07/01/15  5:58 AM  Result Value Ref Range   WBC 3.2 (L) 4.0 - 10.5 K/uL   RBC 2.84 (L) 3.87 - 5.11 MIL/uL   Hemoglobin 8.7 (L) 12.0 - 15.0 g/dL   HCT 27.3 (L) 36.0 - 46.0 %   MCV 96.1 78.0 - 100.0 fL   MCH 30.6 26.0 - 34.0 pg   MCHC 31.9 30.0 - 36.0 g/dL   RDW 14.2 11.5 - 15.5 %   Platelets 188 150 - 400 K/uL  Renal function panel     Status: Abnormal   Collection Time: 07/01/15  5:58 AM  Result Value Ref Range   Sodium 140 135 - 145 mmol/L   Potassium 4.2 3.5 - 5.1 mmol/L   Chloride 99 (L) 101 - 111 mmol/L   CO2 26 22 - 32 mmol/L   Glucose, Bld 111 (H) 65 - 99 mg/dL   BUN 79 (H) 6 - 20 mg/dL   Creatinine, Ser 6.12 (H) 0.44 - 1.00 mg/dL   Calcium 8.0 (L) 8.9 - 10.3 mg/dL   Phosphorus 6.1 (H) 2.5 - 4.6 mg/dL   Albumin 3.3 (L) 3.5 -  5.0 g/dL   GFR calc non Af Amer 6 (L) >60 mL/min   GFR calc Af Amer 7 (L) >60 mL/min    Comment: (NOTE) The eGFR has been calculated using the CKD EPI equation. This calculation has not been validated in all clinical situations. eGFR's persistently <60 mL/min signify possible Chronic Kidney Disease.    Anion gap 15 5 - 15  Glucose, capillary     Status: Abnormal   Collection Time:  07/01/15  7:48 AM  Result Value Ref Range   Glucose-Capillary 108 (H) 65 - 99 mg/dL  Echocardiogram     Status: Abnormal   Collection Time: 07/01/15 10:15 AM  Result Value Ref Range   Weight 4032 oz   BP 161/55 mmHg   AV vel 1.71    LVOT SV INDEX 38 mL/m2   LA VOL 2D 32.6 mL   LV Single Plane A4C EF 59 %   LVIDD 34 (A) 3.5 - 6.0 mm   LV PW d 17 (A) 0.6 - 1.1 mm   LVIDS 24 (A) 2.1 - 4.0 mm   FS 29 28 - 44 %   LA vol 49.3 mL   LA size 36 mm   POSTERIOR WALL SYSTOLE 17 mm   IV/PVOW 1.18    Stroke Volume 50 ml   LVOT VTI 28.1 cm   Mean grad 9 mmHg   Valve area 1.71 cm2   LVOT peak grad rest 7 mmHg   E decel time 335 msec   LVOT diameter 20 mm   LVOT area 3.14 cm2   LVOT peak vel 128 cm/s   LVOT peak VTI .54 cm   Ao pk vel .56 m/s   VTI 51.6 cm   LVOT SV 88 mL   Peak grad 21 mmHg   AO mean calculated velocity dopler 137 cm/s   MV pk E vel .9 cm/s   TR max vel 248 cm/s   LV sys vol 70 (A) 14 - 42 mL   LV sys vol index 9 mL/m2   LV dias vol 70 46 - 106 mL   LV dias vol index 30 mL/m2   LA vol index 21.4 mL/m2   Valve area index .74    AV peak Index .76    MV Dec 335 ms   SV INDEX 21.5 mL/m2   LVOT MEAN VEL 85.8 cm/s   LA SIZE INDEX 1.56 mm/m2   LA VOL 2D INDEX 14.1 mL/m2   TV PEAK RV-RA GRADIENT 25 cm/s   AORTIC ROOT 2D 29 mm   LA diam end sys 36 mm   Simpson's disk 71    TDI e' medial 6.64 cm/s   TDI e' lateral 6.31 cm/s   TAPSE 32.3 mm  Glucose, capillary     Status: Abnormal   Collection Time: 07/01/15 12:17 PM  Result Value Ref Range   Glucose-Capillary 155 (H) 65 - 99 mg/dL   Comment 1 Notify RN    Comment 2 Document in Chart   Glucose, capillary     Status: Abnormal   Collection Time: 07/01/15  5:10 PM  Result Value Ref Range   Glucose-Capillary 179 (H) 65 - 99 mg/dL  Glucose, capillary     Status: Abnormal   Collection Time: 07/01/15  8:42 PM  Result Value Ref Range   Glucose-Capillary 170 (H) 65 - 99 mg/dL  CBC     Status: Abnormal   Collection  Time: 07/02/15  5:45 AM  Result Value Ref Range  WBC 4.4 4.0 - 10.5 K/uL   RBC 2.74 (L) 3.87 - 5.11 MIL/uL   Hemoglobin 8.8 (L) 12.0 - 15.0 g/dL   HCT 26.3 (L) 36.0 - 46.0 %   MCV 96.0 78.0 - 100.0 fL   MCH 32.1 26.0 - 34.0 pg   MCHC 33.5 30.0 - 36.0 g/dL   RDW 14.2 11.5 - 15.5 %   Platelets 174 150 - 400 K/uL  Renal function panel     Status: Abnormal   Collection Time: 07/02/15  5:45 AM  Result Value Ref Range   Sodium 138 135 - 145 mmol/L   Potassium 3.7 3.5 - 5.1 mmol/L   Chloride 97 (L) 101 - 111 mmol/L   CO2 26 22 - 32 mmol/L   Glucose, Bld 137 (H) 65 - 99 mg/dL   BUN 79 (H) 6 - 20 mg/dL   Creatinine, Ser 6.19 (H) 0.44 - 1.00 mg/dL   Calcium 7.8 (L) 8.9 - 10.3 mg/dL   Phosphorus 5.8 (H) 2.5 - 4.6 mg/dL   Albumin 3.2 (L) 3.5 - 5.0 g/dL   GFR calc non Af Amer 6 (L) >60 mL/min   GFR calc Af Amer 7 (L) >60 mL/min    Comment: (NOTE) The eGFR has been calculated using the CKD EPI equation. This calculation has not been validated in all clinical situations. eGFR's persistently <60 mL/min signify possible Chronic Kidney Disease.    Anion gap 15 5 - 15  Glucose, capillary     Status: Abnormal   Collection Time: 07/02/15  7:25 AM  Result Value Ref Range   Glucose-Capillary 110 (H) 65 - 99 mg/dL  Glucose, capillary     Status: Abnormal   Collection Time: 07/02/15 11:34 AM  Result Value Ref Range   Glucose-Capillary 173 (H) 65 - 99 mg/dL   Comment 1 Notify RN    Comment 2 Document in Chart   BASIC METABOLIC PANEL WITH GFR     Status: Abnormal   Collection Time: 07/04/15 11:54 AM  Result Value Ref Range   Sodium 138 135 - 146 mmol/L   Potassium 4.0 3.5 - 5.3 mmol/L   Chloride 96 (L) 98 - 110 mmol/L   CO2 26 20 - 31 mmol/L   Glucose, Bld 134 (H) 65 - 99 mg/dL   BUN 83 (H) 7 - 25 mg/dL   Creat 6.15 (H) 0.60 - 0.93 mg/dL   Calcium 8.3 (L) 8.6 - 10.4 mg/dL   GFR, Est African American 7 (L) >=60 mL/min   GFR, Est Non African American 6 (L) >=60 mL/min    Comment:   The  estimated GFR is a calculation valid for adults (>=18 years old) that uses the CKD-EPI algorithm to adjust for age and sex. It is   not to be used for children, pregnant women, hospitalized patients,    patients on dialysis, or with rapidly changing kidney function. According to the NKDEP, eGFR >89 is normal, 60-89 shows mild impairment, 30-59 shows moderate impairment, 15-29 shows severe impairment and <15 is ESRD.     Renal function panel     Status: Abnormal   Collection Time: 07/23/15  1:53 PM  Result Value Ref Range   Sodium 140 135 - 145 mmol/L   Potassium 4.2 3.5 - 5.1 mmol/L   Chloride 101 101 - 111 mmol/L   CO2 22 22 - 32 mmol/L   Glucose, Bld 118 (H) 65 - 99 mg/dL   BUN 95 (H) 6 - 20 mg/dL   Creatinine,  Ser 6.61 (H) 0.44 - 1.00 mg/dL   Calcium 8.1 (L) 8.9 - 10.3 mg/dL   Phosphorus 5.9 (H) 2.5 - 4.6 mg/dL   Albumin 3.7 3.5 - 5.0 g/dL   GFR calc non Af Amer 6 (L) >60 mL/min   GFR calc Af Amer 6 (L) >60 mL/min    Comment: (NOTE) The eGFR has been calculated using the CKD EPI equation. This calculation has not been validated in all clinical situations. eGFR's persistently <60 mL/min signify possible Chronic Kidney Disease.    Anion gap 17 (H) 5 - 15  Iron and TIBC     Status: Abnormal   Collection Time: 07/23/15  1:53 PM  Result Value Ref Range   Iron 59 28 - 170 ug/dL   TIBC 230 (L) 250 - 450 ug/dL   Saturation Ratios 26 10.4 - 31.8 %   UIBC 171 ug/dL  Ferritin     Status: Abnormal   Collection Time: 07/23/15  1:53 PM  Result Value Ref Range   Ferritin 321 (H) 11 - 307 ng/mL  PTH, intact and calcium     Status: Abnormal   Collection Time: 07/23/15  1:53 PM  Result Value Ref Range   PTH 265 (H) 15 - 65 pg/mL   Calcium, Total (PTH) 7.8 (L) 8.7 - 10.3 mg/dL   PTH Comment     Comment: (NOTE) Interpretation                 Intact PTH    Calcium                                (pg/mL)      (mg/dL) Normal                          15 - 65     8.6 - 10.2 Primary  Hyperparathyroidism         >65          >10.2 Secondary Hyperparathyroidism       >65          <10.2 Non-Parathyroid Hypercalcemia       <65          >10.2 Hypoparathyroidism                  <15          < 8.6 Non-Parathyroid Hypocalcemia    15 - 65          < 8.6 Performed At: [facility] [place], [place] [id number] Lindon Romp MD [id number]   Hemoglobin-hemacue, POC     Status: Abnormal   Collection Time: 07/23/15  1:53 PM  Result Value Ref Range   Hemoglobin 9.8 (L) 12.0 - 15.0 g/dL  Hemoglobin-hemacue, POC     Status: Abnormal   Collection Time: 08/06/15  1:49 PM  Result Value Ref Range   Hemoglobin 9.9 (L) 12.0 - 15.0 g/dL  Renal function panel     Status: Abnormal   Collection Time: 08/20/15  2:10 PM  Result Value Ref Range   Sodium 141 135 - 145 mmol/L   Potassium 4.2 3.5 - 5.1 mmol/L   Chloride 102 101 - 111 mmol/L   CO2 22 22 - 32 mmol/L   Glucose, Bld 113 (H) 65 - 99 mg/dL   BUN 103 (H) 6 - 20 mg/dL   Creatinine, Ser 6.39 (  H) 0.44 - 1.00 mg/dL   Calcium 7.5 (L) 8.9 - 10.3 mg/dL   Phosphorus 6.6 (H) 2.5 - 4.6 mg/dL   Albumin 3.6 3.5 - 5.0 g/dL   GFR calc non Af Amer 6 (L) >60 mL/min   GFR calc Af Amer 7 (L) >60 mL/min    Comment: (NOTE) The eGFR has been calculated using the CKD EPI equation. This calculation has not been validated in all clinical situations. eGFR's persistently <60 mL/min signify possible Chronic Kidney Disease.    Anion gap 17 (H) 5 - 15  Iron and TIBC     Status: Abnormal   Collection Time: 08/20/15  2:10 PM  Result Value Ref Range   Iron 55 28 - 170 ug/dL   TIBC 206 (L) 250 - 450 ug/dL   Saturation Ratios 27 10.4 - 31.8 %   UIBC 151 ug/dL  Ferritin     Status: Abnormal   Collection Time: 08/20/15  2:10 PM  Result Value Ref Range   Ferritin 448 (H) 11 - 307 ng/mL  PTH, intact and calcium     Status: Abnormal   Collection Time: 08/20/15  2:10 PM  Result Value Ref Range   PTH 238 (H) 15 - 65 pg/mL    Calcium, Total (PTH) 7.5 (L) 8.7 - 10.3 mg/dL   PTH Comment     Comment: (NOTE) Interpretation                 Intact PTH    Calcium                                (pg/mL)      (mg/dL) Normal                          15 - 65     8.6 - 10.2 Primary Hyperparathyroidism         >65          >10.2 Secondary Hyperparathyroidism       >65          <10.2 Non-Parathyroid Hypercalcemia       <65          >10.2 Hypoparathyroidism                  <15          < 8.6 Non-Parathyroid Hypocalcemia    15 - 65          < 8.6 Performed At: [facility] [address] [place], [place] [id number] Lindon Romp MD [id number]   Hemoglobin-hemacue, POC     Status: Abnormal   Collection Time: 08/20/15  2:11 PM  Result Value Ref Range   Hemoglobin 9.4 (L) 12.0 - 15.0 g/dL   Objective: General: Patient is awake, alert, and oriented x 3 and in no acute distress.  Integument: Skin is warm, dry and supple bilateral. Nails are tender, long, thickened and  dystrophic with subungual debris, consistent with onychomycosis, 1-5 bilateral. No signs of infection. No open lesions or preulcerative lesions present bilateral. Remaining integument unremarkable.  Vasculature:  Dorsalis Pedis pulse 1/4 bilateral. Posterior Tibial pulse  0/4 bilateral.  Capillary fill time <5 sec 1-5 bilateral. No hair growth to the level of the digits. Temperature gradient within normal limits. No varicosities present bilateral. Mild edema present to ankles bilateral.   Neurology: The patient has diminished sensation measured  with a 5.07/10g Semmes Weinstein Monofilament at all pedal sites bilateral . Vibratory sensation diminished bilateral with tuning fork. No Babinski sign present bilateral.   Musculoskeletal: No symptomatic pedal deformities noted bilateral. Muscular strength 5/5 in all lower extremity muscular groups bilateral without pain on range of motion . No tenderness with calf compression bilateral.  Assessment and  Plan: Problem List Items Addressed This Visit    None    Visit Diagnoses    Pain due to onychomycosis of nail    -  Primary    Type 2 diabetes, controlled, with neuropathy ([place])        Type 2 diabetes mellitus with diabetic peripheral angiopathy without gangrene, with long-term current use of insulin ([place])           -Examined patient. -Discussed and educated patient on diabetic foot care, especially with  regards to the vascular, neurological and musculoskeletal systems.  -Stressed the importance of good glycemic control and the detriment of not  controlling glucose levels in relation to the foot. -Mechanically debrided all nails 1-5 bilateral using sterile nail nipper and filed with dremel without incident  -Encouraged patient to use compression stockings of which she owns to assist with edema control -Answered all patient questions -Patient to return  in 3 months for at risk foot care -Patient advised to call the office if any problems or questions arise in the meantime.  Landis Martins, DPM

## 2015-09-03 ENCOUNTER — Inpatient Hospital Stay (HOSPITAL_COMMUNITY): Admission: RE | Admit: 2015-09-03 | Payer: Medicare Other | Source: Ambulatory Visit

## 2015-09-11 ENCOUNTER — Encounter (HOSPITAL_COMMUNITY)
Admission: RE | Admit: 2015-09-11 | Discharge: 2015-09-11 | Disposition: A | Discharge status: Home / Self Care | Payer: Medicare Other | Source: Ambulatory Visit | Attending: Nephrology | Admitting: Nephrology

## 2015-09-11 DIAGNOSIS — D631 Anemia in chronic kidney disease: Secondary | ICD-10-CM | POA: Diagnosis not present

## 2015-09-11 DIAGNOSIS — N184 Chronic kidney disease, stage 4 (severe): Secondary | ICD-10-CM | POA: Diagnosis not present

## 2015-09-11 LAB — POCT HEMOGLOBIN-HEMACUE: HEMOGLOBIN: 10.1 g/dL — AB (ref 12.0–15.0)

## 2015-09-11 MED ORDER — EPOETIN ALFA 20000 UNIT/ML IJ SOLN
INTRAMUSCULAR | Status: AC
  Filled 2015-09-11: qty 1

## 2015-09-11 MED ORDER — EPOETIN ALFA 20000 UNIT/ML IJ SOLN
20000.0000 [IU] | INTRAMUSCULAR | Status: DC
  Administered 2015-09-11: 20000 [IU] via SUBCUTANEOUS

## 2015-09-18 DIAGNOSIS — E119 Type 2 diabetes mellitus without complications: Secondary | ICD-10-CM | POA: Diagnosis not present

## 2015-09-18 DIAGNOSIS — N2581 Secondary hyperparathyroidism of renal origin: Secondary | ICD-10-CM | POA: Diagnosis not present

## 2015-09-18 DIAGNOSIS — N184 Chronic kidney disease, stage 4 (severe): Secondary | ICD-10-CM | POA: Diagnosis not present

## 2015-09-18 DIAGNOSIS — D649 Anemia, unspecified: Secondary | ICD-10-CM | POA: Diagnosis not present

## 2015-09-18 DIAGNOSIS — I129 Hypertensive chronic kidney disease with stage 1 through stage 4 chronic kidney disease, or unspecified chronic kidney disease: Secondary | ICD-10-CM | POA: Diagnosis not present

## 2015-09-25 ENCOUNTER — Encounter (HOSPITAL_COMMUNITY)
Admission: RE | Admit: 2015-09-25 | Discharge: 2015-09-25 | Disposition: A | Discharge status: Home / Self Care | Payer: Medicare Other | Source: Ambulatory Visit | Attending: Nephrology | Admitting: Nephrology

## 2015-09-25 DIAGNOSIS — D631 Anemia in chronic kidney disease: Secondary | ICD-10-CM | POA: Insufficient documentation

## 2015-09-25 DIAGNOSIS — N184 Chronic kidney disease, stage 4 (severe): Secondary | ICD-10-CM | POA: Diagnosis not present

## 2015-09-25 LAB — RENAL FUNCTION PANEL
ALBUMIN: 3.6 g/dL (ref 3.5–5.0)
ANION GAP: 14 (ref 5–15)
BUN: 87 mg/dL — ABNORMAL HIGH (ref 6–20)
CALCIUM: 7.8 mg/dL — AB (ref 8.9–10.3)
CO2: 25 mmol/L (ref 22–32)
Chloride: 101 mmol/L (ref 101–111)
Creatinine, Ser: 5.96 mg/dL — ABNORMAL HIGH (ref 0.44–1.00)
GFR calc Af Amer: 7 mL/min — ABNORMAL LOW (ref >60)
GFR, EST NON AFRICAN AMERICAN: 6 mL/min — AB (ref >60)
GLUCOSE: 159 mg/dL — AB (ref 65–99)
PHOSPHORUS: 6.9 mg/dL — AB (ref 2.5–4.6)
Potassium: 4.7 mmol/L (ref 3.5–5.1)
SODIUM: 140 mmol/L (ref 135–145)

## 2015-09-25 LAB — IRON AND TIBC
Iron: 51 ug/dL (ref 28–170)
SATURATION RATIOS: 23 % (ref 10.4–31.8)
TIBC: 218 ug/dL — ABNORMAL LOW (ref 250–450)
UIBC: 167 ug/dL

## 2015-09-25 LAB — POCT HEMOGLOBIN-HEMACUE: Hemoglobin: 9.4 g/dL — ABNORMAL LOW (ref 12.0–15.0)

## 2015-09-25 LAB — FERRITIN: Ferritin: 383 ng/mL — ABNORMAL HIGH (ref 11–307)

## 2015-09-25 MED ORDER — EPOETIN ALFA 20000 UNIT/ML IJ SOLN
20000.0000 [IU] | INTRAMUSCULAR | Status: DC
  Administered 2015-09-25: 20000 [IU] via SUBCUTANEOUS

## 2015-09-25 MED ORDER — EPOETIN ALFA 20000 UNIT/ML IJ SOLN
INTRAMUSCULAR | Status: AC
  Filled 2015-09-25: qty 1

## 2015-09-26 LAB — PTH, INTACT AND CALCIUM
Calcium, Total (PTH): 7.9 mg/dL — ABNORMAL LOW (ref 8.7–10.3)
PTH: 328 pg/mL — AB (ref 15–65)

## 2015-10-09 ENCOUNTER — Encounter (HOSPITAL_COMMUNITY)
Admission: RE | Admit: 2015-10-09 | Discharge: 2015-10-09 | Disposition: A | Discharge status: Home / Self Care | Payer: Medicare Other | Source: Ambulatory Visit | Attending: Nephrology | Admitting: Nephrology

## 2015-10-09 DIAGNOSIS — D631 Anemia in chronic kidney disease: Secondary | ICD-10-CM | POA: Diagnosis not present

## 2015-10-09 DIAGNOSIS — N184 Chronic kidney disease, stage 4 (severe): Secondary | ICD-10-CM | POA: Diagnosis not present

## 2015-10-09 LAB — POCT HEMOGLOBIN-HEMACUE: Hemoglobin: 9.3 g/dL — ABNORMAL LOW (ref 12.0–15.0)

## 2015-10-09 MED ORDER — EPOETIN ALFA 20000 UNIT/ML IJ SOLN
20000.0000 [IU] | INTRAMUSCULAR | Status: DC
  Administered 2015-10-09: 20000 [IU] via SUBCUTANEOUS

## 2015-10-09 MED ORDER — EPOETIN ALFA 20000 UNIT/ML IJ SOLN
INTRAMUSCULAR | Status: AC
  Filled 2015-10-09: qty 1

## 2015-10-16 DIAGNOSIS — N185 Chronic kidney disease, stage 5: Secondary | ICD-10-CM | POA: Diagnosis not present

## 2015-10-16 DIAGNOSIS — D649 Anemia, unspecified: Secondary | ICD-10-CM | POA: Diagnosis not present

## 2015-10-16 DIAGNOSIS — E119 Type 2 diabetes mellitus without complications: Secondary | ICD-10-CM | POA: Diagnosis not present

## 2015-10-16 DIAGNOSIS — I120 Hypertensive chronic kidney disease with stage 5 chronic kidney disease or end stage renal disease: Secondary | ICD-10-CM | POA: Diagnosis not present

## 2015-10-16 DIAGNOSIS — N2581 Secondary hyperparathyroidism of renal origin: Secondary | ICD-10-CM | POA: Diagnosis not present

## 2015-10-17 ENCOUNTER — Other Ambulatory Visit: Payer: Self-pay | Admitting: Family Medicine

## 2015-10-20 ENCOUNTER — Other Ambulatory Visit: Payer: Self-pay | Admitting: Family Medicine

## 2015-10-20 NOTE — Telephone Encounter (Signed)
Rx filled.  [name]. Jerline Pain, [place] Resident PGY-3 10/20/2015 5:02 PM

## 2015-10-20 NOTE — Telephone Encounter (Signed)
LM for pt to call back to schedule an apt with Dr. Jerline Pain. Page, cma.

## 2015-10-20 NOTE — Telephone Encounter (Signed)
Rx filled.  [name]. Jerline Pain, [place] Resident PGY-3 10/20/2015 9:09 AM

## 2015-10-23 ENCOUNTER — Encounter (HOSPITAL_COMMUNITY)
Admission: RE | Admit: 2015-10-23 | Discharge: 2015-10-23 | Disposition: A | Discharge status: Home / Self Care | Payer: Medicare Other | Source: Ambulatory Visit | Attending: Nephrology | Admitting: Nephrology

## 2015-10-23 DIAGNOSIS — D631 Anemia in chronic kidney disease: Secondary | ICD-10-CM | POA: Diagnosis not present

## 2015-10-23 DIAGNOSIS — N184 Chronic kidney disease, stage 4 (severe): Secondary | ICD-10-CM | POA: Insufficient documentation

## 2015-10-23 LAB — RENAL FUNCTION PANEL
ANION GAP: 10 (ref 5–15)
Albumin: 3.2 g/dL — ABNORMAL LOW (ref 3.5–5.0)
BUN: 77 mg/dL — ABNORMAL HIGH (ref 6–20)
CHLORIDE: 107 mmol/L (ref 101–111)
CO2: 23 mmol/L (ref 22–32)
CREATININE: 5.42 mg/dL — AB (ref 0.44–1.00)
Calcium: 7.9 mg/dL — ABNORMAL LOW (ref 8.9–10.3)
GFR, EST AFRICAN AMERICAN: 8 mL/min — AB (ref >60)
GFR, EST NON AFRICAN AMERICAN: 7 mL/min — AB (ref >60)
Glucose, Bld: 148 mg/dL — ABNORMAL HIGH (ref 65–99)
POTASSIUM: 4.3 mmol/L (ref 3.5–5.1)
Phosphorus: 5.4 mg/dL — ABNORMAL HIGH (ref 2.5–4.6)
Sodium: 140 mmol/L (ref 135–145)

## 2015-10-23 LAB — IRON AND TIBC
IRON: 46 ug/dL (ref 28–170)
SATURATION RATIOS: 23 % (ref 10.4–31.8)
TIBC: 202 ug/dL — AB (ref 250–450)
UIBC: 156 ug/dL

## 2015-10-23 LAB — POCT HEMOGLOBIN-HEMACUE: HEMOGLOBIN: 9.1 g/dL — AB (ref 12.0–15.0)

## 2015-10-23 LAB — FERRITIN: FERRITIN: 324 ng/mL — AB (ref 11–307)

## 2015-10-23 MED ORDER — EPOETIN ALFA 20000 UNIT/ML IJ SOLN
INTRAMUSCULAR | Status: AC
  Filled 2015-10-23: qty 1

## 2015-10-23 MED ORDER — EPOETIN ALFA 20000 UNIT/ML IJ SOLN
20000.0000 [IU] | INTRAMUSCULAR | Status: DC
  Administered 2015-10-23: 20000 [IU] via SUBCUTANEOUS

## 2015-10-24 LAB — PTH, INTACT AND CALCIUM
Calcium, Total (PTH): 7.7 mg/dL — ABNORMAL LOW (ref 8.7–10.3)
PTH: 295 pg/mL — AB (ref 15–65)

## 2015-10-27 ENCOUNTER — Other Ambulatory Visit: Payer: Self-pay | Admitting: Internal Medicine

## 2015-10-29 NOTE — Telephone Encounter (Signed)
Rx filled.  [name]. Jerline Pain, [place] [facility] Resident PGY-3 10/29/2015 7:20 AM

## 2015-11-05 ENCOUNTER — Other Ambulatory Visit (HOSPITAL_COMMUNITY): Payer: Self-pay | Admitting: *Deleted

## 2015-11-06 ENCOUNTER — Ambulatory Visit (HOSPITAL_COMMUNITY)
Admission: RE | Admit: 2015-11-06 | Discharge: 2015-11-06 | Disposition: A | Discharge status: Home / Self Care | Payer: Medicare Other | Source: Ambulatory Visit | Attending: Nephrology | Admitting: Nephrology

## 2015-11-06 DIAGNOSIS — N184 Chronic kidney disease, stage 4 (severe): Secondary | ICD-10-CM | POA: Insufficient documentation

## 2015-11-06 DIAGNOSIS — D638 Anemia in other chronic diseases classified elsewhere: Secondary | ICD-10-CM | POA: Diagnosis not present

## 2015-11-06 LAB — POCT HEMOGLOBIN-HEMACUE: Hemoglobin: 9.3 g/dL — ABNORMAL LOW (ref 12.0–15.0)

## 2015-11-06 MED ORDER — EPOETIN ALFA 20000 UNIT/ML IJ SOLN
20000.0000 [IU] | INTRAMUSCULAR | Status: DC
  Administered 2015-11-06: 20000 [IU] via SUBCUTANEOUS

## 2015-11-06 MED ORDER — SODIUM CHLORIDE 0.9 % IV SOLN
510.0000 mg | INTRAVENOUS | Status: DC
  Administered 2015-11-06: 510 mg via INTRAVENOUS
  Filled 2015-11-06: qty 17

## 2015-11-06 MED ORDER — EPOETIN ALFA 20000 UNIT/ML IJ SOLN
INTRAMUSCULAR | Status: AC
  Filled 2015-11-06: qty 1

## 2015-11-11 DIAGNOSIS — K430 Incisional hernia with obstruction, without gangrene: Secondary | ICD-10-CM | POA: Diagnosis not present

## 2015-11-13 ENCOUNTER — Ambulatory Visit (HOSPITAL_COMMUNITY)
Admission: RE | Admit: 2015-11-13 | Discharge: 2015-11-13 | Disposition: A | Discharge status: Home / Self Care | Payer: Medicare Other | Source: Ambulatory Visit | Attending: Nephrology | Admitting: Nephrology

## 2015-11-13 DIAGNOSIS — D509 Iron deficiency anemia, unspecified: Secondary | ICD-10-CM | POA: Insufficient documentation

## 2015-11-13 MED ORDER — SODIUM CHLORIDE 0.9 % IV SOLN
510.0000 mg | INTRAVENOUS | Status: AC
  Administered 2015-11-13: 510 mg via INTRAVENOUS
  Filled 2015-11-13: qty 17

## 2015-11-19 ENCOUNTER — Other Ambulatory Visit (HOSPITAL_COMMUNITY): Payer: Self-pay | Admitting: *Deleted

## 2015-11-20 ENCOUNTER — Ambulatory Visit (HOSPITAL_COMMUNITY)
Admission: RE | Admit: 2015-11-20 | Discharge: 2015-11-20 | Disposition: A | Discharge status: Home / Self Care | Payer: Medicare Other | Source: Ambulatory Visit | Attending: Nephrology | Admitting: Nephrology

## 2015-11-20 DIAGNOSIS — Z79899 Other long term (current) drug therapy: Secondary | ICD-10-CM | POA: Diagnosis not present

## 2015-11-20 DIAGNOSIS — D631 Anemia in chronic kidney disease: Secondary | ICD-10-CM | POA: Diagnosis not present

## 2015-11-20 DIAGNOSIS — E1122 Type 2 diabetes mellitus with diabetic chronic kidney disease: Secondary | ICD-10-CM

## 2015-11-20 DIAGNOSIS — Z5181 Encounter for therapeutic drug level monitoring: Secondary | ICD-10-CM | POA: Diagnosis not present

## 2015-11-20 DIAGNOSIS — Z794 Long term (current) use of insulin: Secondary | ICD-10-CM

## 2015-11-20 DIAGNOSIS — N184 Chronic kidney disease, stage 4 (severe): Secondary | ICD-10-CM | POA: Diagnosis not present

## 2015-11-20 DIAGNOSIS — N185 Chronic kidney disease, stage 5: Secondary | ICD-10-CM

## 2015-11-20 LAB — RENAL FUNCTION PANEL
ANION GAP: 11 (ref 5–15)
Albumin: 3.5 g/dL (ref 3.5–5.0)
BUN: 79 mg/dL — ABNORMAL HIGH (ref 6–20)
CHLORIDE: 103 mmol/L (ref 101–111)
CO2: 25 mmol/L (ref 22–32)
CREATININE: 6.16 mg/dL — AB (ref 0.44–1.00)
Calcium: 8.4 mg/dL — ABNORMAL LOW (ref 8.9–10.3)
GFR, EST AFRICAN AMERICAN: 7 mL/min — AB (ref >60)
GFR, EST NON AFRICAN AMERICAN: 6 mL/min — AB (ref >60)
Glucose, Bld: 119 mg/dL — ABNORMAL HIGH (ref 65–99)
POTASSIUM: 4.8 mmol/L (ref 3.5–5.1)
Phosphorus: 5.3 mg/dL — ABNORMAL HIGH (ref 2.5–4.6)
Sodium: 139 mmol/L (ref 135–145)

## 2015-11-20 LAB — IRON AND TIBC
IRON: 83 ug/dL (ref 28–170)
SATURATION RATIOS: 41 % — AB (ref 10.4–31.8)
TIBC: 204 ug/dL — AB (ref 250–450)
UIBC: 121 ug/dL

## 2015-11-20 LAB — FERRITIN: FERRITIN: 499 ng/mL — AB (ref 11–307)

## 2015-11-20 LAB — POCT HEMOGLOBIN-HEMACUE: Hemoglobin: 9.5 g/dL — ABNORMAL LOW (ref 12.0–15.0)

## 2015-11-20 MED ORDER — EPOETIN ALFA 20000 UNIT/ML IJ SOLN
INTRAMUSCULAR | Status: AC
  Filled 2015-11-20: qty 1

## 2015-11-20 MED ORDER — SODIUM CHLORIDE 0.9 % IV SOLN
510.0000 mg | INTRAVENOUS | Status: DC
  Filled 2015-11-20: qty 17

## 2015-11-20 MED ORDER — EPOETIN ALFA 20000 UNIT/ML IJ SOLN
20000.0000 [IU] | INTRAMUSCULAR | Status: DC
  Administered 2015-11-20: 20000 [IU] via SUBCUTANEOUS

## 2015-11-21 LAB — PTH, INTACT AND CALCIUM: Calcium, Total (PTH): 8.4 mg/dL — ABNORMAL LOW (ref 8.7–10.3)

## 2015-11-24 DIAGNOSIS — N2581 Secondary hyperparathyroidism of renal origin: Secondary | ICD-10-CM | POA: Diagnosis not present

## 2015-11-24 DIAGNOSIS — D631 Anemia in chronic kidney disease: Secondary | ICD-10-CM | POA: Diagnosis not present

## 2015-11-24 DIAGNOSIS — N185 Chronic kidney disease, stage 5: Secondary | ICD-10-CM | POA: Diagnosis not present

## 2015-11-24 DIAGNOSIS — E119 Type 2 diabetes mellitus without complications: Secondary | ICD-10-CM | POA: Diagnosis not present

## 2015-11-24 DIAGNOSIS — N184 Chronic kidney disease, stage 4 (severe): Secondary | ICD-10-CM | POA: Diagnosis not present

## 2015-11-25 ENCOUNTER — Other Ambulatory Visit: Payer: Self-pay

## 2015-11-28 ENCOUNTER — Telehealth: Payer: Self-pay | Admitting: Vascular Surgery

## 2015-11-28 NOTE — Telephone Encounter (Signed)
Per Dr.Fields note on 10/23/15 and Carol's instructions, I reached out to patient to schedule her an appt her for evaluation of access placement. I scheduled her for an appt on 01/01/16 at [facility] with vein mapping prior and to see CEF. She later called back to cancel these appts stating she did not need this appt. awt

## 2015-12-02 ENCOUNTER — Ambulatory Visit: Payer: Medicare Other | Admitting: Podiatry

## 2015-12-04 ENCOUNTER — Encounter (HOSPITAL_COMMUNITY)
Admission: RE | Admit: 2015-12-04 | Discharge: 2015-12-04 | Disposition: A | Discharge status: Home / Self Care | Payer: Medicare Other | Source: Ambulatory Visit | Attending: Nephrology | Admitting: Nephrology

## 2015-12-04 ENCOUNTER — Encounter (HOSPITAL_COMMUNITY): Payer: Self-pay | Admitting: *Deleted

## 2015-12-04 DIAGNOSIS — N184 Chronic kidney disease, stage 4 (severe): Secondary | ICD-10-CM | POA: Diagnosis not present

## 2015-12-04 DIAGNOSIS — E1122 Type 2 diabetes mellitus with diabetic chronic kidney disease: Secondary | ICD-10-CM

## 2015-12-04 DIAGNOSIS — D631 Anemia in chronic kidney disease: Secondary | ICD-10-CM | POA: Diagnosis not present

## 2015-12-04 DIAGNOSIS — N185 Chronic kidney disease, stage 5: Secondary | ICD-10-CM

## 2015-12-04 DIAGNOSIS — Z794 Long term (current) use of insulin: Secondary | ICD-10-CM

## 2015-12-04 LAB — POCT HEMOGLOBIN-HEMACUE: Hemoglobin: 10.2 g/dL — ABNORMAL LOW (ref 12.0–15.0)

## 2015-12-04 MED ORDER — EPOETIN ALFA 20000 UNIT/ML IJ SOLN
INTRAMUSCULAR | Status: AC
  Administered 2015-12-04: 20000 [IU] via SUBCUTANEOUS
  Filled 2015-12-04: qty 1

## 2015-12-04 MED ORDER — EPOETIN ALFA 20000 UNIT/ML IJ SOLN
20000.0000 [IU] | INTRAMUSCULAR | Status: DC
  Administered 2015-12-04: 20000 [IU] via SUBCUTANEOUS

## 2015-12-04 NOTE — Progress Notes (Signed)
Pt denies cardiac history or chest pain. She states she does have sob with exertion at times. Pt is diabetic, last A1C was 6.5 on 06/29/15. Pt states when she checks her fasting blood sugar it's usually between 80-90. Instructed pt to take 1/2 of her regular dose of Lantus tonight (should take 15 units) and to check her blood sugar in the AM.  If blood sugar is 70 or below, treat with 1/2 cup of clear juice (apple or cranberry) and recheck blood sugar 15 minutes after drinking juice. If blood sugar continues to be 70 or below, call the Short Stay department and ask to speak to a nurse. She voiced understanding.  Pt had a lot of questions about exactly what was going to be done tomorrow for her surgery. She expressed concern about having a fistula created and having dialysis catheter inserted was too much. I encouraged her to call Dr. Luther Parody office and ask to speak to one of the nurses. She stated she would just ask Dr. Donnetta Hutching in the morning.

## 2015-12-05 ENCOUNTER — Encounter (HOSPITAL_COMMUNITY)
Admission: RE | Disposition: A | Discharge status: Home / Self Care | Payer: Self-pay | Source: Ambulatory Visit | Attending: Vascular Surgery

## 2015-12-05 ENCOUNTER — Ambulatory Visit (HOSPITAL_COMMUNITY)
Admission: RE | Admit: 2015-12-05 | Discharge: 2015-12-05 | Disposition: A | Discharge status: Home / Self Care | Payer: Medicare Other | Source: Ambulatory Visit | Attending: Vascular Surgery | Admitting: Vascular Surgery

## 2015-12-05 ENCOUNTER — Encounter (HOSPITAL_COMMUNITY): Payer: Self-pay | Admitting: *Deleted

## 2015-12-05 ENCOUNTER — Other Ambulatory Visit: Payer: Self-pay | Admitting: *Deleted

## 2015-12-05 ENCOUNTER — Ambulatory Visit: Payer: Medicare Other | Admitting: Family Medicine

## 2015-12-05 ENCOUNTER — Ambulatory Visit (HOSPITAL_COMMUNITY): Payer: Medicare Other

## 2015-12-05 ENCOUNTER — Ambulatory Visit (HOSPITAL_COMMUNITY): Payer: Medicare Other | Admitting: Certified Registered"

## 2015-12-05 ENCOUNTER — Telehealth: Payer: Self-pay | Admitting: Vascular Surgery

## 2015-12-05 DIAGNOSIS — Z7982 Long term (current) use of aspirin: Secondary | ICD-10-CM | POA: Insufficient documentation

## 2015-12-05 DIAGNOSIS — Z6841 Body Mass Index (BMI) 40.0 and over, adult: Secondary | ICD-10-CM | POA: Insufficient documentation

## 2015-12-05 DIAGNOSIS — Z794 Long term (current) use of insulin: Secondary | ICD-10-CM | POA: Insufficient documentation

## 2015-12-05 DIAGNOSIS — Z452 Encounter for adjustment and management of vascular access device: Secondary | ICD-10-CM | POA: Diagnosis not present

## 2015-12-05 DIAGNOSIS — Z4931 Encounter for adequacy testing for hemodialysis: Secondary | ICD-10-CM

## 2015-12-05 DIAGNOSIS — I132 Hypertensive heart and chronic kidney disease with heart failure and with stage 5 chronic kidney disease, or end stage renal disease: Secondary | ICD-10-CM | POA: Diagnosis not present

## 2015-12-05 DIAGNOSIS — I739 Peripheral vascular disease, unspecified: Secondary | ICD-10-CM | POA: Diagnosis not present

## 2015-12-05 DIAGNOSIS — E1122 Type 2 diabetes mellitus with diabetic chronic kidney disease: Secondary | ICD-10-CM | POA: Insufficient documentation

## 2015-12-05 DIAGNOSIS — N186 End stage renal disease: Secondary | ICD-10-CM | POA: Insufficient documentation

## 2015-12-05 DIAGNOSIS — Z419 Encounter for procedure for purposes other than remedying health state, unspecified: Secondary | ICD-10-CM

## 2015-12-05 DIAGNOSIS — E1151 Type 2 diabetes mellitus with diabetic peripheral angiopathy without gangrene: Secondary | ICD-10-CM | POA: Insufficient documentation

## 2015-12-05 DIAGNOSIS — Z992 Dependence on renal dialysis: Secondary | ICD-10-CM

## 2015-12-05 DIAGNOSIS — I509 Heart failure, unspecified: Secondary | ICD-10-CM | POA: Insufficient documentation

## 2015-12-05 DIAGNOSIS — Z95828 Presence of other vascular implants and grafts: Secondary | ICD-10-CM

## 2015-12-05 DIAGNOSIS — Z79899 Other long term (current) drug therapy: Secondary | ICD-10-CM | POA: Insufficient documentation

## 2015-12-05 DIAGNOSIS — E785 Hyperlipidemia, unspecified: Secondary | ICD-10-CM | POA: Diagnosis not present

## 2015-12-05 DIAGNOSIS — N185 Chronic kidney disease, stage 5: Secondary | ICD-10-CM | POA: Diagnosis not present

## 2015-12-05 HISTORY — DX: Peripheral vascular disease, unspecified: I73.9

## 2015-12-05 HISTORY — DX: Heart failure, unspecified: I50.9

## 2015-12-05 HISTORY — DX: Reserved for inherently not codable concepts without codable children: IMO0001

## 2015-12-05 HISTORY — PX: INSERTION OF DIALYSIS CATHETER: SHX1324

## 2015-12-05 HISTORY — DX: Iron deficiency: E61.1

## 2015-12-05 HISTORY — DX: Unspecified osteoarthritis, unspecified site: M19.90

## 2015-12-05 HISTORY — PX: AV FISTULA PLACEMENT: SHX1204

## 2015-12-05 LAB — POCT I-STAT 4, (NA,K, GLUC, HGB,HCT)
GLUCOSE: 120 mg/dL — AB (ref 65–99)
HCT: 30 % — ABNORMAL LOW (ref 36.0–46.0)
Hemoglobin: 10.2 g/dL — ABNORMAL LOW (ref 12.0–15.0)
POTASSIUM: 4.3 mmol/L (ref 3.5–5.1)
SODIUM: 141 mmol/L (ref 135–145)

## 2015-12-05 LAB — GLUCOSE, CAPILLARY: Glucose-Capillary: 115 mg/dL — ABNORMAL HIGH (ref 65–99)

## 2015-12-05 SURGERY — ARTERIOVENOUS (AV) FISTULA CREATION
Anesthesia: Monitor Anesthesia Care | Site: Neck | Laterality: Right

## 2015-12-05 MED ORDER — LIDOCAINE HCL (PF) 1 % IJ SOLN
INTRAMUSCULAR | Status: AC
  Filled 2015-12-05: qty 30

## 2015-12-05 MED ORDER — LIDOCAINE HCL 1 % IJ SOLN
INTRAMUSCULAR | Status: DC | PRN
  Administered 2015-12-05: 27 mL

## 2015-12-05 MED ORDER — PROMETHAZINE HCL 25 MG/ML IJ SOLN: 6.2500 mg | INTRAMUSCULAR | Status: DC | PRN

## 2015-12-05 MED ORDER — DEXTROSE 5 % IV SOLN
INTRAVENOUS | Status: AC
  Filled 2015-12-05: qty 1.5

## 2015-12-05 MED ORDER — FENTANYL CITRATE (PF) 100 MCG/2ML IJ SOLN: 25.0000 ug | INTRAMUSCULAR | Status: DC | PRN

## 2015-12-05 MED ORDER — MEPERIDINE HCL 25 MG/ML IJ SOLN: 6.2500 mg | INTRAMUSCULAR | Status: DC | PRN

## 2015-12-05 MED ORDER — HEPARIN SODIUM (PORCINE) 1000 UNIT/ML IJ SOLN
INTRAMUSCULAR | Status: DC | PRN
  Administered 2015-12-05: 3.4 mL

## 2015-12-05 MED ORDER — PROTAMINE SULFATE 10 MG/ML IV SOLN
INTRAVENOUS | Status: DC | PRN
  Administered 2015-12-05: 50 mg via INTRAVENOUS

## 2015-12-05 MED ORDER — PROPOFOL 500 MG/50ML IV EMUL
INTRAVENOUS | Status: DC | PRN
  Administered 2015-12-05: 50 ug/kg/min via INTRAVENOUS

## 2015-12-05 MED ORDER — PROTAMINE SULFATE 10 MG/ML IV SOLN
INTRAVENOUS | Status: AC
  Filled 2015-12-05: qty 10

## 2015-12-05 MED ORDER — SODIUM CHLORIDE 0.9 % IV SOLN
INTRAVENOUS | Status: DC
  Administered 2015-12-05 (×2): via INTRAVENOUS

## 2015-12-05 MED ORDER — LIDOCAINE HCL (CARDIAC) 20 MG/ML IV SOLN
INTRAVENOUS | Status: DC | PRN
  Administered 2015-12-05: 50 mg via INTRATRACHEAL

## 2015-12-05 MED ORDER — MIDAZOLAM HCL 2 MG/2ML IJ SOLN
INTRAMUSCULAR | Status: AC
  Filled 2015-12-05: qty 2

## 2015-12-05 MED ORDER — FENTANYL CITRATE (PF) 100 MCG/2ML IJ SOLN
INTRAMUSCULAR | Status: DC | PRN
  Administered 2015-12-05: 50 ug via INTRAVENOUS

## 2015-12-05 MED ORDER — OXYCODONE-ACETAMINOPHEN 5-325 MG PO TABS: 1.0000 | ORAL_TABLET | Freq: Four times a day (QID) | ORAL | 0 refills | Status: DC | PRN

## 2015-12-05 MED ORDER — IOPAMIDOL (ISOVUE-300) INJECTION 61%
INTRAVENOUS | Status: AC
  Filled 2015-12-05: qty 50

## 2015-12-05 MED ORDER — CHLORHEXIDINE GLUCONATE CLOTH 2 % EX PADS: 6.0000 | MEDICATED_PAD | Freq: Once | CUTANEOUS | Status: DC

## 2015-12-05 MED ORDER — 0.9 % SODIUM CHLORIDE (POUR BTL) OPTIME
TOPICAL | Status: DC | PRN
  Administered 2015-12-05: 1000 mL

## 2015-12-05 MED ORDER — HEPARIN SODIUM (PORCINE) 1000 UNIT/ML IJ SOLN
INTRAMUSCULAR | Status: AC
  Filled 2015-12-05: qty 1

## 2015-12-05 MED ORDER — SODIUM CHLORIDE 0.9 % IV SOLN
INTRAVENOUS | Status: DC | PRN
  Administered 2015-12-05: 500 mL

## 2015-12-05 MED ORDER — DEXTROSE 5 % IV SOLN
1.5000 g | INTRAVENOUS | Status: AC
  Administered 2015-12-05: 1.5 g via INTRAVENOUS

## 2015-12-05 MED ORDER — HEPARIN SODIUM (PORCINE) 1000 UNIT/ML IJ SOLN
INTRAMUSCULAR | Status: DC | PRN
  Administered 2015-12-05: 5000 [IU] via INTRAVENOUS

## 2015-12-05 MED ORDER — MIDAZOLAM HCL 2 MG/2ML IJ SOLN: 0.5000 mg | Freq: Once | INTRAMUSCULAR | Status: DC | PRN

## 2015-12-05 MED ORDER — MIDAZOLAM HCL 2 MG/2ML IJ SOLN
INTRAMUSCULAR | Status: DC | PRN
  Administered 2015-12-05 (×2): 1 mg via INTRAVENOUS

## 2015-12-05 MED ORDER — FENTANYL CITRATE (PF) 100 MCG/2ML IJ SOLN
INTRAMUSCULAR | Status: AC
  Filled 2015-12-05: qty 2

## 2015-12-05 SURGICAL SUPPLY — 55 items
ARMBAND PINK RESTRICT EXTREMIT (MISCELLANEOUS) ×6 IMPLANT
BAG DECANTER FOR FLEXI CONT (MISCELLANEOUS) ×3 IMPLANT
BENZOIN TINCTURE PRP APPL 2/3 (GAUZE/BANDAGES/DRESSINGS) IMPLANT
BIOPATCH RED 1 DISK 7.0 (GAUZE/BANDAGES/DRESSINGS) ×3 IMPLANT
CANISTER SUCTION 2500CC (MISCELLANEOUS) ×3 IMPLANT
CANNULA VESSEL 3MM 2 BLNT TIP (CANNULA) ×3 IMPLANT
CATH PALINDROME RT-P 15FX19CM (CATHETERS) IMPLANT
CATH PALINDROME RT-P 15FX23CM (CATHETERS) ×3 IMPLANT
CATH PALINDROME RT-P 15FX28CM (CATHETERS) IMPLANT
CATH PALINDROME RT-P 15FX55CM (CATHETERS) IMPLANT
CLIP LIGATING EXTRA MED SLVR (CLIP) ×3 IMPLANT
CLIP LIGATING EXTRA SM BLUE (MISCELLANEOUS) ×3 IMPLANT
CLSR STERI-STRIP ANTIMIC 1/2X4 (GAUZE/BANDAGES/DRESSINGS) ×3 IMPLANT
COVER PROBE W GEL 5X96 (DRAPES) ×3 IMPLANT
DECANTER SPIKE VIAL GLASS SM (MISCELLANEOUS) ×3 IMPLANT
DRAPE C-ARM 42X72 X-RAY (DRAPES) ×3 IMPLANT
DRAPE CHEST BREAST 15X10 FENES (DRAPES) ×3 IMPLANT
ELECT REM PT RETURN 9FT ADLT (ELECTROSURGICAL) ×3
ELECTRODE REM PT RTRN 9FT ADLT (ELECTROSURGICAL) ×2 IMPLANT
GAUZE SPONGE 2X2 8PLY STRL LF (GAUZE/BANDAGES/DRESSINGS) ×2 IMPLANT
GAUZE SPONGE 4X4 12PLY STRL (GAUZE/BANDAGES/DRESSINGS) ×3 IMPLANT
GAUZE SPONGE 4X4 16PLY XRAY LF (GAUZE/BANDAGES/DRESSINGS) ×3 IMPLANT
GEL ULTRASOUND 20GR AQUASONIC (MISCELLANEOUS) IMPLANT
GLOVE BIO SURGEON STRL SZ 6.5 (GLOVE) ×3 IMPLANT
GLOVE SS BIOGEL STRL SZ 7.5 (GLOVE) ×2 IMPLANT
GLOVE SUPERSENSE BIOGEL SZ 7.5 (GLOVE) ×1
GOWN STRL REUS W/ TWL LRG LVL3 (GOWN DISPOSABLE) ×6 IMPLANT
GOWN STRL REUS W/TWL LRG LVL3 (GOWN DISPOSABLE) ×3
KIT BASIN OR (CUSTOM PROCEDURE TRAY) ×3 IMPLANT
KIT ROOM TURNOVER OR (KITS) ×3 IMPLANT
LIQUID BAND (GAUZE/BANDAGES/DRESSINGS) ×6 IMPLANT
NEEDLE 18GX1X1/2 (RX/OR ONLY) (NEEDLE) ×3 IMPLANT
NEEDLE 22X1 1/2 (OR ONLY) (NEEDLE) ×3 IMPLANT
NEEDLE HYPO 25GX1X1/2 BEV (NEEDLE) ×3 IMPLANT
NS IRRIG 1000ML POUR BTL (IV SOLUTION) ×3 IMPLANT
PACK CV ACCESS (CUSTOM PROCEDURE TRAY) ×3 IMPLANT
PACK SURGICAL SETUP 50X90 (CUSTOM PROCEDURE TRAY) ×3 IMPLANT
PAD ARMBOARD 7.5X6 YLW CONV (MISCELLANEOUS) ×6 IMPLANT
SOAP 2 % CHG 4 OZ (WOUND CARE) IMPLANT
SPONGE GAUZE 2X2 STER 10/PKG (GAUZE/BANDAGES/DRESSINGS) ×1
SPONGE GAUZE 4X4 12PLY STER LF (GAUZE/BANDAGES/DRESSINGS) ×3 IMPLANT
STRIP CLOSURE SKIN 1/2X4 (GAUZE/BANDAGES/DRESSINGS) ×3 IMPLANT
SUT ETHILON 3 0 PS 1 (SUTURE) ×3 IMPLANT
SUT PROLENE 6 0 CC (SUTURE) ×3 IMPLANT
SUT PROLENE 7 0 BV 1 (SUTURE) ×6 IMPLANT
SUT VIC AB 3-0 SH 27 (SUTURE) ×3
SUT VIC AB 3-0 SH 27X BRD (SUTURE) ×6 IMPLANT
SUT VICRYL 4-0 PS2 18IN ABS (SUTURE) ×6 IMPLANT
SYR 20CC LL (SYRINGE) ×6 IMPLANT
SYR 5ML LL (SYRINGE) ×3 IMPLANT
SYR CONTROL 10ML LL (SYRINGE) ×3 IMPLANT
SYRINGE 10CC LL (SYRINGE) ×3 IMPLANT
TAPE CLOTH SURG 4X10 WHT LF (GAUZE/BANDAGES/DRESSINGS) ×3 IMPLANT
UNDERPAD 30X30 INCONTINENT (UNDERPADS AND DIAPERS) ×3 IMPLANT
WATER STERILE IRR 1000ML POUR (IV SOLUTION) ×3 IMPLANT

## 2015-12-05 NOTE — Anesthesia Postprocedure Evaluation (Signed)
Anesthesia Post Note  Patient: ADDLEY JAMA  Procedure(s) Performed: Procedure(s) (LRB): RADIOCEPHALIC VERSUS BRACHIOCEPHALIC ARTERIOVENOUS (AV) FISTULA CREATION (Left) INSERTION OF DIALYSIS CATHETER (Right)  Patient location during evaluation: PACU Anesthesia Type: MAC Level of consciousness: awake and alert, patient cooperative and oriented Pain management: pain level controlled Vital Signs Assessment: post-procedure vital signs reviewed and stable Respiratory status: nonlabored ventilation, spontaneous breathing and respiratory function stable Cardiovascular status: blood pressure returned to baseline Postop Assessment: no signs of nausea or vomiting Anesthetic complications: no    Last Vitals:  Vitals:   12/05/15 1318 12/05/15 1333  BP: (!) 162/67 (!) 178/59  Pulse: 62 (!) 59  Resp: 20 19  Temp:  36.4 C    Last Pain:  Vitals:   12/05/15 0959  TempSrc: Oral                 Lakresha Stifter,E. Drucella Karbowski

## 2015-12-05 NOTE — Anesthesia Preprocedure Evaluation (Signed)
Anesthesia Evaluation  Patient identified by MRN, date of birth, ID band Patient awake    Reviewed: Allergy & Precautions, NPO status , Patient's Chart, lab work & pertinent test results  History of Anesthesia Complications Negative for: history of anesthetic complications  Airway Mallampati: II  TM Distance: >3 FB Neck ROM: Full    Dental  (+) Edentulous Upper, Edentulous Lower   Pulmonary shortness of breath,    breath sounds clear to auscultation       Cardiovascular hypertension, Pt. on medications and Pt. on home beta blockers +CHF   Rhythm:Regular Rate:Normal     Neuro/Psych Anxiety negative neurological ROS     GI/Hepatic negative GI ROS, Neg liver ROS,   Endo/Other  diabetes (glu 120), Insulin DependentMorbid obesity  Renal/GU ESRFRenal disease (K+ 4.3, no dialysis yet)     Musculoskeletal   Abdominal (+) + obese,   Peds  Hematology  (+) Blood dyscrasia (Hb 10.0), anemia ,   Anesthesia Other Findings   Reproductive/Obstetrics                             Anesthesia Physical Anesthesia Plan  ASA: III  Anesthesia Plan: MAC   Post-op Pain Management:    Induction:   Airway Management Planned: Natural Airway and Simple Face Mask  Additional Equipment:   Intra-op Plan:   Post-operative Plan:   Informed Consent: I have reviewed the patients History and Physical, chart, labs and discussed the procedure including the risks, benefits and alternatives for the proposed anesthesia with the patient or authorized representative who has indicated his/her understanding and acceptance.     Plan Discussed with: CRNA and Surgeon  Anesthesia Plan Comments: (Plan routine monitors, MAC)        Anesthesia Quick Evaluation

## 2015-12-05 NOTE — Transfer of Care (Signed)
Immediate Anesthesia Transfer of Care Note  Patient: Rachel Hobbs  Procedure(s) Performed: Procedure(s): RADIOCEPHALIC VERSUS BRACHIOCEPHALIC ARTERIOVENOUS (AV) FISTULA CREATION (Left) INSERTION OF DIALYSIS CATHETER (Right)  Patient Location: PACU  Anesthesia Type:MAC  Level of Consciousness: awake, alert , oriented and patient cooperative  Airway & Oxygen Therapy: Patient Spontanous Breathing and Patient connected to face mask oxygen  Post-op Assessment: Report given to RN and Post -op Vital signs reviewed and stable  Post vital signs: Reviewed and stable  Last Vitals:  Vitals:   12/05/15 0959  Pulse: 74  Resp: 18  Temp: 37.1 C    Last Pain:  Vitals:   12/05/15 0959  TempSrc: Oral      Patients Stated Pain Goal: 6 (0000000 [id number])  Complications: No apparent anesthesia complications

## 2015-12-05 NOTE — Telephone Encounter (Signed)
-----   Message from Mena Goes, RN sent at 12/05/2015 12:40 PM EDT ----- Regarding: 6 weeks w/ duplex    ----- Message ----- From: Alvia Grove, PA-C Sent: 12/05/2015  12:24 PM To: [facility] Charge Pool  S/p left brachial-cephalic AVF [id number]  F/u in 6 weeks with CEF and duplex  Thanks Maudie Mercury

## 2015-12-05 NOTE — H&P (Signed)
[facility] HISTORY AND PHYSICAL   History of Present Illness:  Patient is a 75 y.o. year old female who presents for placement of a permanent hemodialysis access. The patient is right handed .  The patient is not currently on hemodialysis.  Other chronic medical problems include CHF, DM, hyperlipid hypertension which are stable.  Past Medical History:  Diagnosis Date  . Arthritis   . CHF (congestive heart failure) ([place])   . Depression   . Diabetes mellitus    type 2  . Hernia   . Hyperlipidemia   . Hypertension   . Low iron   . Peripheral vascular disease (HCC)    in legs  . Renal disorder    renal insufficiency  . Shortness of breath dyspnea    with exertion    Past Surgical History:  Procedure Laterality Date  . ABDOMINAL HYSTERECTOMY     in the 70's  . HERNIA REPAIR     umbilical in the 28'J     Social History Social History  Substance Use Topics  . Smoking status: Never Smoker  . Smokeless tobacco: Never Used  . Alcohol use No    Family History Family History  Problem Relation Age of Onset  . Kidney disease Mother   . Hypertension Mother   . COPD Father     smoke  . Cancer Father     Lung  . Hypertension Father   . Cancer Brother     prostate  . Kidney disease Sister     Allergies  Allergies  Allergen Reactions  . No Known Allergies    No current facility-administered medications on file prior to encounter.    Current Outpatient Prescriptions on File Prior to Encounter  Medication Sig Dispense Refill  . acetaminophen (TYLENOL) 500 MG tablet Take 1,000 mg by mouth every 6 (six) hours as needed (pain).     Marland Kitchen amLODipine (NORVASC) 10 MG tablet Take 1 tablet (10 mg total) by mouth daily. (Patient taking differently: Take 10 mg by mouth at bedtime. ) 90 tablet 3  . aspirin 81 MG tablet Take 81 mg by mouth daily.      Marland Kitchen atorvastatin (LIPITOR) 40 MG tablet TAKE 1 TABLET BY MOUTH EVERY DAY FOR CHOLESTEROL (Patient taking  differently: TAKE 1 TABLET BY MOUTH EVERY NIGHT AT BEDTIME FOR CHOLESTEROL) 90 tablet 1  . calcitRIOL (ROCALTROL) 0.25 MCG capsule Take 0.25 mcg by mouth daily. Take on Monday, Wednesday and Friday     . calcium carbonate (TUMS - DOSED IN MG ELEMENTAL CALCIUM) 500 MG chewable tablet Chew 1 tablet (200 mg of elemental calcium total) by mouth 3 (three) times daily with meals. (Patient taking differently: Chew 1 tablet by mouth 3 (three) times daily as needed for indigestion or heartburn. ) 90 tablet 1  . carvedilol (COREG) 12.5 MG tablet Take 12.5 mg by mouth 2 (two) times daily with a meal.    . furosemide (LASIX) 40 MG tablet TAKE 3 TABLETS BY MOUTH IN THE MORNING AND 2 TABLETS IN THE EVENING (Patient taking differently: TAKE 2 TABLETS BY MOUTH IN THE MORNING AND 2 TABLETS IN THE EVENING) 180 tablet 0  . hydrALAZINE (APRESOLINE) 50 MG tablet Take 1.5 tablets (75 mg total) by mouth 3 (three) times daily. (Patient taking differently: Take 50 mg by mouth 3 (three) times daily. ) 135 tablet 6  . isosorbide mononitrate (IMDUR) 30 MG 24 hr tablet Take 30 mg by mouth daily.    Marland Kitchen  LANTUS SOLOSTAR 100 UNIT/ML Solostar Pen INJECT 30 UNITS INTO THE SKIN EVERY MORNING (Patient taking differently: INJECT 30 UNITS INTO THE SKIN EVERY NIGHT AFTER SUPPER.) 15 mL 5  . Multiple Vitamin (MULTIVITAMIN WITH MINERALS) TABS tablet Take 1 tablet by mouth daily.    . Blood Glucose Monitoring Suppl (BLOOD GLUCOSE METER) kit Use as instructed 1 each 0  . Insulin Pen Needle 31G X 8 MM MISC BD UltraFine III Pen Needles. For use with insulin pen device. Inject insulin 6 x daily 100 each 3  . Lancets (ONETOUCH ULTRASOFT) lancets Once daily testing plus prn for hypoglycemia 100 each 9  . NEEDLE, DISP, 30 G (B-D DISP NEEDLE 30GX1") 30G X 1" MISC 1 each by Does not apply route daily. 100 each 2  . ONE TOUCH ULTRA TEST test strip TEST BLOOD SUGAR EVERY DAY AND AS NEEDED FOR SYMPTOMS OF HYPOGLYCEMIA 100 each 3    ROS:    Cardiac: No  recent episodes of chest pain/pressure, no shortness of breath at rest.  + shortness of breath with exertion.  Denies history of atrial fibrillation or irregular heartbeat  Musculoskeletal:  [x ] Arthritis, [ ]  Low back pain,  [ ]  Joint pain  Hematologic:No history of hypercoagulable state.  No history of easy bleeding.   Urinary: [ x] chronic Kidney disease, [ ]  on HD - [ ]  MWF or [ ]  TTHS, [ ]  Burning with urination, [ ]  Frequent urination, [ ]  Difficulty urinating;   Skin: No rashes  Psychological: No history of anxiety,  No history of depression   Physical Examination  General:  Alert and oriented, no acute distress HEENT: Normal Pulmonary: Clear to auscultation bilaterally Cardiac: Regular Rate and Rhythm  Skin: No rash Extremity Pulses:  2+ radial, brachial pulses bilaterally Musculoskeletal: No deformity or edema  Neurologic: Upper and lower extremity motor 5/5 and symmetric  DATA: vein map from 3/17 reviewed has marginal left cephalic at wrist has 3 mm cephalic upper arm  ASSESSMENT: Needs long term access.  Pt discussed with Dr Moshe Cipro and pt to begin HD next week so will also place catheter   PLAN:  Risks benefits procedure details discussed including but not limited to bleeding infection non maturation steal.  She wishes to proceed.  Ruta Hinds, MD [facility] Office: [phone] Pager: [phone]

## 2015-12-05 NOTE — Op Note (Signed)
Procedure: Ultrasound-guided insertion of Diatek catheter, right internal jugular vein, left brachial cephalic AV fistula  Preoperative diagnosis: End-stage renal disease  Postoperative diagnosis: Same  Anesthesia: Local with IV sedation  Operative findings: 23 cm Palindrome catheter right internal jugular vein. 0000000 mm cephalic vein  Operative details: After obtaining informed consent, the patient was taken to the operating room. The patient was placed in supine position on the operating room table. After adequate sedation the patient's entire neck and chest were prepped and draped in usual sterile fashion. The patient was placed in Trendelenburg position. Ultrasound was used to identify the patient's right internal jugular vein. This had normal compressibility and respiratory variation. Local anesthesia was infiltrated over the right jugular vein.  Using ultrasound guidance, the right internal jugular vein was successfully cannulated.  A 0.035 J-tipped guidewire was threaded into the right internal jugular vein and into the superior vena cava followed by the inferior vena cava under fluoroscopic guidance.   Next sequential 12 and 14 dilators were placed over the guidewire into the right atrium.  A 16 French dilator with a peel-away sheath was then placed over the guidewire into the right atrium.   The guidewire and dilator were removed. A 23 cm Palindrome catheter was then placed through the peel away sheath into the right atrium.  The catheter was then tunneled subcutaneously, cut to length, and the hub attached. The catheter was noted to flush and draw easily. The catheter was inspected under fluoroscopy and found with its tip to be in the right atrium without any kinks throughout its course. The catheter was sutured to the skin with nylon sutures. The neck insertion site was closed with Vicryl stitch. The catheter was then loaded with concentrated Heparin solution. A dry sterile dressing was  applied.  Next, the left upper extremity was prepped and draped in usual sterile fashion.  Local anesthesia was infiltrated near the antecubital crease.  A transverse incision was then made near the antecubital crease the left arm. The incision was carried into the subcutaneous tissues down to level of the cephalic vein. The cephalic vein was approximately 2.5-3 mm in diameter. It was of good quality. This was dissected free circumferentially and small side branches ligated and divided between silk ties or clips. Next the brachial artery was dissected free in the medial portion of the incision. The artery was  3-4 mm in diameter and calcified. The vessel loops were placed proximal and distal to the planned site of arteriotomy. The patient was given 5000 units of intravenous heparin. After appropriate circulation time, the vessel loops were used to control the artery. A longitudinal opening was made in the brachial artery.  The vein was ligated distally with a 2-0 silk tie. The vein was controlled proximally with a fine bulldog clamp. The vein was then swung over to the artery and sewn end of vein to side of artery using a running 7-0 Prolene suture. Just prior to completion of the anastomosis, everything was forebled backbled and thoroughly flushed. The anastomosis was secured, vessel loops released, and there was a palpable thrill in the fistula immediately. After hemostasis was obtained, the subcutaneous tissues were reapproximated using a running 3-0 Vicryl suture. The skin was then closed with a 4 0 Vicryl subcuticular stitch. Dermabond was applied to the skin incision.  The patient had a palpable radial pulse at the end of the case.  Ruta Hinds, MD [facility] Office: [phone] Pager: [phone]  The patient tolerated  procedure well and there were no complications. Instrument sponge and needle counts correct in the case. The patient was taken to the recovery  room in stable condition. Chest x-ray will be obtained in the recovery room.  Ruta Hinds, MD [facility] [place] Office: [phone] Pager: [phone]

## 2015-12-05 NOTE — Telephone Encounter (Signed)
Sched appt 10/5; lab at 2:00 and MD at 2:45. Spoke to pt's daughter to inform pt of appt.

## 2015-12-08 ENCOUNTER — Encounter (HOSPITAL_COMMUNITY): Payer: Self-pay | Admitting: Vascular Surgery

## 2015-12-16 DIAGNOSIS — D688 Other specified coagulation defects: Secondary | ICD-10-CM | POA: Insufficient documentation

## 2015-12-16 DIAGNOSIS — R197 Diarrhea, unspecified: Secondary | ICD-10-CM | POA: Insufficient documentation

## 2015-12-16 DIAGNOSIS — E1151 Type 2 diabetes mellitus with diabetic peripheral angiopathy without gangrene: Secondary | ICD-10-CM

## 2015-12-16 DIAGNOSIS — D631 Anemia in chronic kidney disease: Secondary | ICD-10-CM | POA: Insufficient documentation

## 2015-12-16 DIAGNOSIS — R509 Fever, unspecified: Secondary | ICD-10-CM | POA: Insufficient documentation

## 2015-12-16 DIAGNOSIS — N2581 Secondary hyperparathyroidism of renal origin: Secondary | ICD-10-CM | POA: Insufficient documentation

## 2015-12-16 DIAGNOSIS — T8249XA Other complication of vascular dialysis catheter, initial encounter: Secondary | ICD-10-CM | POA: Diagnosis not present

## 2015-12-16 DIAGNOSIS — L299 Pruritus, unspecified: Secondary | ICD-10-CM | POA: Insufficient documentation

## 2015-12-16 DIAGNOSIS — E1129 Type 2 diabetes mellitus with other diabetic kidney complication: Secondary | ICD-10-CM | POA: Diagnosis not present

## 2015-12-16 DIAGNOSIS — N186 End stage renal disease: Secondary | ICD-10-CM | POA: Diagnosis not present

## 2015-12-16 DIAGNOSIS — D509 Iron deficiency anemia, unspecified: Secondary | ICD-10-CM | POA: Insufficient documentation

## 2015-12-16 HISTORY — DX: Type 2 diabetes mellitus with diabetic peripheral angiopathy without gangrene: E11.51

## 2015-12-17 DIAGNOSIS — R739 Hyperglycemia, unspecified: Secondary | ICD-10-CM | POA: Insufficient documentation

## 2015-12-18 ENCOUNTER — Inpatient Hospital Stay (HOSPITAL_COMMUNITY): Admission: RE | Admit: 2015-12-18 | Payer: Medicare Other | Source: Ambulatory Visit

## 2015-12-18 DIAGNOSIS — N186 End stage renal disease: Secondary | ICD-10-CM | POA: Diagnosis not present

## 2015-12-18 DIAGNOSIS — E119 Type 2 diabetes mellitus without complications: Secondary | ICD-10-CM | POA: Diagnosis not present

## 2015-12-18 DIAGNOSIS — E1129 Type 2 diabetes mellitus with other diabetic kidney complication: Secondary | ICD-10-CM | POA: Diagnosis not present

## 2015-12-18 DIAGNOSIS — T8249XA Other complication of vascular dialysis catheter, initial encounter: Secondary | ICD-10-CM | POA: Diagnosis not present

## 2015-12-20 DIAGNOSIS — D509 Iron deficiency anemia, unspecified: Secondary | ICD-10-CM | POA: Diagnosis not present

## 2015-12-20 DIAGNOSIS — T8249XA Other complication of vascular dialysis catheter, initial encounter: Secondary | ICD-10-CM | POA: Diagnosis not present

## 2015-12-20 DIAGNOSIS — Z23 Encounter for immunization: Secondary | ICD-10-CM | POA: Diagnosis not present

## 2015-12-20 DIAGNOSIS — D631 Anemia in chronic kidney disease: Secondary | ICD-10-CM | POA: Diagnosis not present

## 2015-12-20 DIAGNOSIS — N186 End stage renal disease: Secondary | ICD-10-CM | POA: Diagnosis not present

## 2015-12-20 DIAGNOSIS — D688 Other specified coagulation defects: Secondary | ICD-10-CM | POA: Diagnosis not present

## 2015-12-20 DIAGNOSIS — E1129 Type 2 diabetes mellitus with other diabetic kidney complication: Secondary | ICD-10-CM | POA: Diagnosis not present

## 2015-12-23 DIAGNOSIS — D509 Iron deficiency anemia, unspecified: Secondary | ICD-10-CM | POA: Diagnosis not present

## 2015-12-23 DIAGNOSIS — N186 End stage renal disease: Secondary | ICD-10-CM | POA: Diagnosis not present

## 2015-12-23 DIAGNOSIS — D631 Anemia in chronic kidney disease: Secondary | ICD-10-CM | POA: Diagnosis not present

## 2015-12-23 DIAGNOSIS — E1129 Type 2 diabetes mellitus with other diabetic kidney complication: Secondary | ICD-10-CM | POA: Diagnosis not present

## 2015-12-23 DIAGNOSIS — T8249XA Other complication of vascular dialysis catheter, initial encounter: Secondary | ICD-10-CM | POA: Diagnosis not present

## 2015-12-23 DIAGNOSIS — Z23 Encounter for immunization: Secondary | ICD-10-CM | POA: Diagnosis not present

## 2015-12-23 DIAGNOSIS — D688 Other specified coagulation defects: Secondary | ICD-10-CM | POA: Diagnosis not present

## 2015-12-25 DIAGNOSIS — E1129 Type 2 diabetes mellitus with other diabetic kidney complication: Secondary | ICD-10-CM | POA: Diagnosis not present

## 2015-12-25 DIAGNOSIS — D631 Anemia in chronic kidney disease: Secondary | ICD-10-CM | POA: Diagnosis not present

## 2015-12-25 DIAGNOSIS — D509 Iron deficiency anemia, unspecified: Secondary | ICD-10-CM | POA: Diagnosis not present

## 2015-12-25 DIAGNOSIS — N186 End stage renal disease: Secondary | ICD-10-CM | POA: Diagnosis not present

## 2015-12-25 DIAGNOSIS — D688 Other specified coagulation defects: Secondary | ICD-10-CM | POA: Diagnosis not present

## 2015-12-25 DIAGNOSIS — T8249XA Other complication of vascular dialysis catheter, initial encounter: Secondary | ICD-10-CM | POA: Diagnosis not present

## 2015-12-25 DIAGNOSIS — Z23 Encounter for immunization: Secondary | ICD-10-CM | POA: Diagnosis not present

## 2015-12-27 DIAGNOSIS — T8249XA Other complication of vascular dialysis catheter, initial encounter: Secondary | ICD-10-CM | POA: Diagnosis not present

## 2015-12-27 DIAGNOSIS — E1129 Type 2 diabetes mellitus with other diabetic kidney complication: Secondary | ICD-10-CM | POA: Diagnosis not present

## 2015-12-27 DIAGNOSIS — Z23 Encounter for immunization: Secondary | ICD-10-CM | POA: Diagnosis not present

## 2015-12-27 DIAGNOSIS — D509 Iron deficiency anemia, unspecified: Secondary | ICD-10-CM | POA: Diagnosis not present

## 2015-12-27 DIAGNOSIS — D631 Anemia in chronic kidney disease: Secondary | ICD-10-CM | POA: Diagnosis not present

## 2015-12-27 DIAGNOSIS — N186 End stage renal disease: Secondary | ICD-10-CM | POA: Diagnosis not present

## 2015-12-27 DIAGNOSIS — D688 Other specified coagulation defects: Secondary | ICD-10-CM | POA: Diagnosis not present

## 2015-12-30 DIAGNOSIS — D631 Anemia in chronic kidney disease: Secondary | ICD-10-CM | POA: Diagnosis not present

## 2015-12-30 DIAGNOSIS — T8249XA Other complication of vascular dialysis catheter, initial encounter: Secondary | ICD-10-CM | POA: Diagnosis not present

## 2015-12-30 DIAGNOSIS — D509 Iron deficiency anemia, unspecified: Secondary | ICD-10-CM | POA: Diagnosis not present

## 2015-12-30 DIAGNOSIS — N186 End stage renal disease: Secondary | ICD-10-CM | POA: Diagnosis not present

## 2015-12-30 DIAGNOSIS — E1129 Type 2 diabetes mellitus with other diabetic kidney complication: Secondary | ICD-10-CM | POA: Diagnosis not present

## 2015-12-30 DIAGNOSIS — D688 Other specified coagulation defects: Secondary | ICD-10-CM | POA: Diagnosis not present

## 2015-12-30 DIAGNOSIS — Z23 Encounter for immunization: Secondary | ICD-10-CM | POA: Diagnosis not present

## 2016-01-01 ENCOUNTER — Ambulatory Visit: Payer: Medicare Other | Admitting: Vascular Surgery

## 2016-01-01 ENCOUNTER — Other Ambulatory Visit (HOSPITAL_COMMUNITY): Payer: Medicare Other

## 2016-01-01 ENCOUNTER — Encounter (HOSPITAL_COMMUNITY): Payer: Medicare Other

## 2016-01-01 DIAGNOSIS — D631 Anemia in chronic kidney disease: Secondary | ICD-10-CM | POA: Diagnosis not present

## 2016-01-01 DIAGNOSIS — D688 Other specified coagulation defects: Secondary | ICD-10-CM | POA: Diagnosis not present

## 2016-01-01 DIAGNOSIS — T8249XA Other complication of vascular dialysis catheter, initial encounter: Secondary | ICD-10-CM | POA: Diagnosis not present

## 2016-01-01 DIAGNOSIS — D509 Iron deficiency anemia, unspecified: Secondary | ICD-10-CM | POA: Diagnosis not present

## 2016-01-01 DIAGNOSIS — E1129 Type 2 diabetes mellitus with other diabetic kidney complication: Secondary | ICD-10-CM | POA: Diagnosis not present

## 2016-01-01 DIAGNOSIS — N186 End stage renal disease: Secondary | ICD-10-CM | POA: Diagnosis not present

## 2016-01-01 DIAGNOSIS — Z23 Encounter for immunization: Secondary | ICD-10-CM | POA: Diagnosis not present

## 2016-01-03 DIAGNOSIS — Z23 Encounter for immunization: Secondary | ICD-10-CM | POA: Diagnosis not present

## 2016-01-03 DIAGNOSIS — D688 Other specified coagulation defects: Secondary | ICD-10-CM | POA: Diagnosis not present

## 2016-01-03 DIAGNOSIS — E1129 Type 2 diabetes mellitus with other diabetic kidney complication: Secondary | ICD-10-CM | POA: Diagnosis not present

## 2016-01-03 DIAGNOSIS — T8249XA Other complication of vascular dialysis catheter, initial encounter: Secondary | ICD-10-CM | POA: Diagnosis not present

## 2016-01-03 DIAGNOSIS — N186 End stage renal disease: Secondary | ICD-10-CM | POA: Diagnosis not present

## 2016-01-03 DIAGNOSIS — D509 Iron deficiency anemia, unspecified: Secondary | ICD-10-CM | POA: Diagnosis not present

## 2016-01-03 DIAGNOSIS — D631 Anemia in chronic kidney disease: Secondary | ICD-10-CM | POA: Diagnosis not present

## 2016-01-06 DIAGNOSIS — D688 Other specified coagulation defects: Secondary | ICD-10-CM | POA: Diagnosis not present

## 2016-01-06 DIAGNOSIS — N186 End stage renal disease: Secondary | ICD-10-CM | POA: Diagnosis not present

## 2016-01-06 DIAGNOSIS — Z23 Encounter for immunization: Secondary | ICD-10-CM | POA: Diagnosis not present

## 2016-01-06 DIAGNOSIS — D631 Anemia in chronic kidney disease: Secondary | ICD-10-CM | POA: Diagnosis not present

## 2016-01-06 DIAGNOSIS — E1129 Type 2 diabetes mellitus with other diabetic kidney complication: Secondary | ICD-10-CM | POA: Diagnosis not present

## 2016-01-06 DIAGNOSIS — D509 Iron deficiency anemia, unspecified: Secondary | ICD-10-CM | POA: Diagnosis not present

## 2016-01-06 DIAGNOSIS — T8249XA Other complication of vascular dialysis catheter, initial encounter: Secondary | ICD-10-CM | POA: Diagnosis not present

## 2016-01-08 DIAGNOSIS — T8249XA Other complication of vascular dialysis catheter, initial encounter: Secondary | ICD-10-CM | POA: Diagnosis not present

## 2016-01-08 DIAGNOSIS — D688 Other specified coagulation defects: Secondary | ICD-10-CM | POA: Diagnosis not present

## 2016-01-08 DIAGNOSIS — D509 Iron deficiency anemia, unspecified: Secondary | ICD-10-CM | POA: Diagnosis not present

## 2016-01-08 DIAGNOSIS — N186 End stage renal disease: Secondary | ICD-10-CM | POA: Diagnosis not present

## 2016-01-08 DIAGNOSIS — E1129 Type 2 diabetes mellitus with other diabetic kidney complication: Secondary | ICD-10-CM | POA: Diagnosis not present

## 2016-01-08 DIAGNOSIS — Z23 Encounter for immunization: Secondary | ICD-10-CM | POA: Diagnosis not present

## 2016-01-08 DIAGNOSIS — D631 Anemia in chronic kidney disease: Secondary | ICD-10-CM | POA: Diagnosis not present

## 2016-01-10 DIAGNOSIS — T8249XA Other complication of vascular dialysis catheter, initial encounter: Secondary | ICD-10-CM | POA: Diagnosis not present

## 2016-01-10 DIAGNOSIS — D688 Other specified coagulation defects: Secondary | ICD-10-CM | POA: Diagnosis not present

## 2016-01-10 DIAGNOSIS — D631 Anemia in chronic kidney disease: Secondary | ICD-10-CM | POA: Diagnosis not present

## 2016-01-10 DIAGNOSIS — N186 End stage renal disease: Secondary | ICD-10-CM | POA: Diagnosis not present

## 2016-01-10 DIAGNOSIS — D509 Iron deficiency anemia, unspecified: Secondary | ICD-10-CM | POA: Diagnosis not present

## 2016-01-10 DIAGNOSIS — Z23 Encounter for immunization: Secondary | ICD-10-CM | POA: Diagnosis not present

## 2016-01-10 DIAGNOSIS — E1129 Type 2 diabetes mellitus with other diabetic kidney complication: Secondary | ICD-10-CM | POA: Diagnosis not present

## 2016-01-13 DIAGNOSIS — N186 End stage renal disease: Secondary | ICD-10-CM | POA: Diagnosis not present

## 2016-01-13 DIAGNOSIS — D631 Anemia in chronic kidney disease: Secondary | ICD-10-CM | POA: Diagnosis not present

## 2016-01-13 DIAGNOSIS — T8249XA Other complication of vascular dialysis catheter, initial encounter: Secondary | ICD-10-CM | POA: Diagnosis not present

## 2016-01-13 DIAGNOSIS — Z23 Encounter for immunization: Secondary | ICD-10-CM | POA: Diagnosis not present

## 2016-01-13 DIAGNOSIS — E1129 Type 2 diabetes mellitus with other diabetic kidney complication: Secondary | ICD-10-CM | POA: Diagnosis not present

## 2016-01-13 DIAGNOSIS — D509 Iron deficiency anemia, unspecified: Secondary | ICD-10-CM | POA: Diagnosis not present

## 2016-01-13 DIAGNOSIS — D688 Other specified coagulation defects: Secondary | ICD-10-CM | POA: Diagnosis not present

## 2016-01-15 DIAGNOSIS — T8249XA Other complication of vascular dialysis catheter, initial encounter: Secondary | ICD-10-CM | POA: Diagnosis not present

## 2016-01-15 DIAGNOSIS — D509 Iron deficiency anemia, unspecified: Secondary | ICD-10-CM | POA: Diagnosis not present

## 2016-01-15 DIAGNOSIS — D631 Anemia in chronic kidney disease: Secondary | ICD-10-CM | POA: Diagnosis not present

## 2016-01-15 DIAGNOSIS — D688 Other specified coagulation defects: Secondary | ICD-10-CM | POA: Diagnosis not present

## 2016-01-15 DIAGNOSIS — Z23 Encounter for immunization: Secondary | ICD-10-CM | POA: Diagnosis not present

## 2016-01-15 DIAGNOSIS — E1129 Type 2 diabetes mellitus with other diabetic kidney complication: Secondary | ICD-10-CM | POA: Diagnosis not present

## 2016-01-15 DIAGNOSIS — N186 End stage renal disease: Secondary | ICD-10-CM | POA: Diagnosis not present

## 2016-01-16 ENCOUNTER — Ambulatory Visit: Payer: Medicare Other | Admitting: Podiatry

## 2016-01-17 DIAGNOSIS — D631 Anemia in chronic kidney disease: Secondary | ICD-10-CM | POA: Diagnosis not present

## 2016-01-17 DIAGNOSIS — Z23 Encounter for immunization: Secondary | ICD-10-CM | POA: Diagnosis not present

## 2016-01-17 DIAGNOSIS — Z992 Dependence on renal dialysis: Secondary | ICD-10-CM | POA: Diagnosis not present

## 2016-01-17 DIAGNOSIS — N186 End stage renal disease: Secondary | ICD-10-CM | POA: Diagnosis not present

## 2016-01-17 DIAGNOSIS — T8249XA Other complication of vascular dialysis catheter, initial encounter: Secondary | ICD-10-CM | POA: Diagnosis not present

## 2016-01-17 DIAGNOSIS — D509 Iron deficiency anemia, unspecified: Secondary | ICD-10-CM | POA: Diagnosis not present

## 2016-01-17 DIAGNOSIS — E1129 Type 2 diabetes mellitus with other diabetic kidney complication: Secondary | ICD-10-CM | POA: Diagnosis not present

## 2016-01-17 DIAGNOSIS — D688 Other specified coagulation defects: Secondary | ICD-10-CM | POA: Diagnosis not present

## 2016-01-20 DIAGNOSIS — D631 Anemia in chronic kidney disease: Secondary | ICD-10-CM | POA: Diagnosis not present

## 2016-01-20 DIAGNOSIS — D688 Other specified coagulation defects: Secondary | ICD-10-CM | POA: Diagnosis not present

## 2016-01-20 DIAGNOSIS — E1129 Type 2 diabetes mellitus with other diabetic kidney complication: Secondary | ICD-10-CM | POA: Diagnosis not present

## 2016-01-20 DIAGNOSIS — N186 End stage renal disease: Secondary | ICD-10-CM | POA: Diagnosis not present

## 2016-01-20 DIAGNOSIS — D509 Iron deficiency anemia, unspecified: Secondary | ICD-10-CM | POA: Diagnosis not present

## 2016-01-20 DIAGNOSIS — T8249XA Other complication of vascular dialysis catheter, initial encounter: Secondary | ICD-10-CM | POA: Diagnosis not present

## 2016-01-21 DIAGNOSIS — H2513 Age-related nuclear cataract, bilateral: Secondary | ICD-10-CM | POA: Diagnosis not present

## 2016-01-21 DIAGNOSIS — H524 Presbyopia: Secondary | ICD-10-CM | POA: Diagnosis not present

## 2016-01-21 DIAGNOSIS — H25013 Cortical age-related cataract, bilateral: Secondary | ICD-10-CM | POA: Diagnosis not present

## 2016-01-21 LAB — HM DIABETES EYE EXAM

## 2016-01-22 ENCOUNTER — Encounter (HOSPITAL_COMMUNITY): Payer: Medicare Other

## 2016-01-22 ENCOUNTER — Encounter: Payer: Medicare Other | Admitting: Vascular Surgery

## 2016-01-22 DIAGNOSIS — T8249XA Other complication of vascular dialysis catheter, initial encounter: Secondary | ICD-10-CM | POA: Diagnosis not present

## 2016-01-22 DIAGNOSIS — D631 Anemia in chronic kidney disease: Secondary | ICD-10-CM | POA: Diagnosis not present

## 2016-01-22 DIAGNOSIS — N186 End stage renal disease: Secondary | ICD-10-CM | POA: Diagnosis not present

## 2016-01-22 DIAGNOSIS — E1129 Type 2 diabetes mellitus with other diabetic kidney complication: Secondary | ICD-10-CM | POA: Diagnosis not present

## 2016-01-22 DIAGNOSIS — D688 Other specified coagulation defects: Secondary | ICD-10-CM | POA: Diagnosis not present

## 2016-01-22 DIAGNOSIS — D509 Iron deficiency anemia, unspecified: Secondary | ICD-10-CM | POA: Diagnosis not present

## 2016-01-23 ENCOUNTER — Encounter: Payer: Self-pay | Admitting: Podiatry

## 2016-01-23 ENCOUNTER — Ambulatory Visit (INDEPENDENT_AMBULATORY_CARE_PROVIDER_SITE_OTHER): Payer: Medicare Other | Admitting: Podiatry

## 2016-01-23 VITALS — BP 187/63 | HR 74 | Resp 16

## 2016-01-23 DIAGNOSIS — E1140 Type 2 diabetes mellitus with diabetic neuropathy, unspecified: Secondary | ICD-10-CM

## 2016-01-23 DIAGNOSIS — M79609 Pain in unspecified limb: Secondary | ICD-10-CM | POA: Diagnosis not present

## 2016-01-23 DIAGNOSIS — B351 Tinea unguium: Secondary | ICD-10-CM | POA: Diagnosis not present

## 2016-01-23 NOTE — Progress Notes (Signed)
Patient ID: [name], female   DOB: 05/24/1940, 75 y.o.   MRN: [id number] HPI  Complaint:  Visit Type: Patient returns to my office for continued preventative foot care services. Complaint: Patient states" my nails have grown long and thick and become painful to walk and wear shoes" Patient has been diagnosed with DM . This patient  presents for preventative foot care services. No changes to ROS  Podiatric Exam: Vascular: dorsalis pedis and posterior tibial pulses are non palpable due to swelling both feet.. Capillary return is slow to refill. Temperature gradient is negative. Skin turgor WNL, bilateral swelling  Sensorium: Diminished  Semmes Weinstein monofilament test. Normal tactile sensation bilaterally.  Nail Exam: Pt has thick disfigured discolored nails with subungual debris noted bilateral entire nail hallux through fifth toenails Ulcer Exam: There is no evidence of ulcer or pre-ulcerative changes or infection. Orthopedic Exam: Muscle tone and strength are WNL. No limitations in general ROM. No crepitus or effusions noted. Foot type and digits show no abnormalities.  HAV  B/L Skin: No Porokeratosis. No infection or ulcers  Diagnosis:  Onychomycosis, Pain in right toe, pain in left toes  Treatment & Plan Procedures and Treatment: Consent by patient was obtained for treatment procedures. The patient understood the discussion of treatment and procedures well. All questions were answered thoroughly reviewed. Debridement of mycotic and hypertrophic toenails, 1 through 5 bilateral and clearing of subungual debris. No ulceration, no infection noted.  Return Visit-Office Procedure: Patient instructed to return to the office for a follow up visit 3 months for continued evaluation and treatment.

## 2016-01-24 DIAGNOSIS — E1129 Type 2 diabetes mellitus with other diabetic kidney complication: Secondary | ICD-10-CM | POA: Diagnosis not present

## 2016-01-24 DIAGNOSIS — D688 Other specified coagulation defects: Secondary | ICD-10-CM | POA: Diagnosis not present

## 2016-01-24 DIAGNOSIS — T8249XA Other complication of vascular dialysis catheter, initial encounter: Secondary | ICD-10-CM | POA: Diagnosis not present

## 2016-01-24 DIAGNOSIS — D631 Anemia in chronic kidney disease: Secondary | ICD-10-CM | POA: Diagnosis not present

## 2016-01-24 DIAGNOSIS — N186 End stage renal disease: Secondary | ICD-10-CM | POA: Diagnosis not present

## 2016-01-24 DIAGNOSIS — D509 Iron deficiency anemia, unspecified: Secondary | ICD-10-CM | POA: Diagnosis not present

## 2016-01-26 ENCOUNTER — Telehealth: Payer: Self-pay | Admitting: Family Medicine

## 2016-01-26 DIAGNOSIS — I10 Essential (primary) hypertension: Secondary | ICD-10-CM

## 2016-01-26 NOTE — Telephone Encounter (Signed)
Needs refill on amlodapine . [facility] on [address]

## 2016-01-27 DIAGNOSIS — D688 Other specified coagulation defects: Secondary | ICD-10-CM | POA: Diagnosis not present

## 2016-01-27 DIAGNOSIS — E1129 Type 2 diabetes mellitus with other diabetic kidney complication: Secondary | ICD-10-CM | POA: Diagnosis not present

## 2016-01-27 DIAGNOSIS — N186 End stage renal disease: Secondary | ICD-10-CM | POA: Diagnosis not present

## 2016-01-27 DIAGNOSIS — T8249XA Other complication of vascular dialysis catheter, initial encounter: Secondary | ICD-10-CM | POA: Diagnosis not present

## 2016-01-27 DIAGNOSIS — D509 Iron deficiency anemia, unspecified: Secondary | ICD-10-CM | POA: Diagnosis not present

## 2016-01-27 DIAGNOSIS — D631 Anemia in chronic kidney disease: Secondary | ICD-10-CM | POA: Diagnosis not present

## 2016-01-27 MED ORDER — AMLODIPINE BESYLATE 10 MG PO TABS: 10.0000 mg | ORAL_TABLET | Freq: Every day | ORAL | 3 refills | Status: DC

## 2016-01-27 NOTE — Telephone Encounter (Signed)
LM for patient to call back.  Please assist her in scheduling an appt with pcp. Jazmin Hartsell,CMA

## 2016-01-27 NOTE — Telephone Encounter (Signed)
Rx filled.  [name]. Jerline Pain, [facility] Resident PGY-3 01/27/2016 12:20 PM

## 2016-01-28 NOTE — Telephone Encounter (Signed)
LM for patient to call back and schedule an appt with her pcp.  She will be following up on bp. Hessie Varone,CMA

## 2016-01-29 DIAGNOSIS — N186 End stage renal disease: Secondary | ICD-10-CM | POA: Diagnosis not present

## 2016-01-29 DIAGNOSIS — T8249XA Other complication of vascular dialysis catheter, initial encounter: Secondary | ICD-10-CM | POA: Diagnosis not present

## 2016-01-29 DIAGNOSIS — D509 Iron deficiency anemia, unspecified: Secondary | ICD-10-CM | POA: Diagnosis not present

## 2016-01-29 DIAGNOSIS — E1129 Type 2 diabetes mellitus with other diabetic kidney complication: Secondary | ICD-10-CM | POA: Diagnosis not present

## 2016-01-29 DIAGNOSIS — D631 Anemia in chronic kidney disease: Secondary | ICD-10-CM | POA: Diagnosis not present

## 2016-01-29 DIAGNOSIS — D688 Other specified coagulation defects: Secondary | ICD-10-CM | POA: Diagnosis not present

## 2016-01-31 DIAGNOSIS — N186 End stage renal disease: Secondary | ICD-10-CM | POA: Diagnosis not present

## 2016-01-31 DIAGNOSIS — E1129 Type 2 diabetes mellitus with other diabetic kidney complication: Secondary | ICD-10-CM | POA: Diagnosis not present

## 2016-01-31 DIAGNOSIS — D688 Other specified coagulation defects: Secondary | ICD-10-CM | POA: Diagnosis not present

## 2016-01-31 DIAGNOSIS — D509 Iron deficiency anemia, unspecified: Secondary | ICD-10-CM | POA: Diagnosis not present

## 2016-01-31 DIAGNOSIS — T8249XA Other complication of vascular dialysis catheter, initial encounter: Secondary | ICD-10-CM | POA: Diagnosis not present

## 2016-01-31 DIAGNOSIS — D631 Anemia in chronic kidney disease: Secondary | ICD-10-CM | POA: Diagnosis not present

## 2016-02-03 DIAGNOSIS — D688 Other specified coagulation defects: Secondary | ICD-10-CM | POA: Diagnosis not present

## 2016-02-03 DIAGNOSIS — E1129 Type 2 diabetes mellitus with other diabetic kidney complication: Secondary | ICD-10-CM | POA: Diagnosis not present

## 2016-02-03 DIAGNOSIS — D509 Iron deficiency anemia, unspecified: Secondary | ICD-10-CM | POA: Diagnosis not present

## 2016-02-03 DIAGNOSIS — T8249XA Other complication of vascular dialysis catheter, initial encounter: Secondary | ICD-10-CM | POA: Diagnosis not present

## 2016-02-03 DIAGNOSIS — D631 Anemia in chronic kidney disease: Secondary | ICD-10-CM | POA: Diagnosis not present

## 2016-02-03 DIAGNOSIS — N186 End stage renal disease: Secondary | ICD-10-CM | POA: Diagnosis not present

## 2016-02-05 DIAGNOSIS — E1129 Type 2 diabetes mellitus with other diabetic kidney complication: Secondary | ICD-10-CM | POA: Diagnosis not present

## 2016-02-05 DIAGNOSIS — D509 Iron deficiency anemia, unspecified: Secondary | ICD-10-CM | POA: Diagnosis not present

## 2016-02-05 DIAGNOSIS — D631 Anemia in chronic kidney disease: Secondary | ICD-10-CM | POA: Diagnosis not present

## 2016-02-05 DIAGNOSIS — D688 Other specified coagulation defects: Secondary | ICD-10-CM | POA: Diagnosis not present

## 2016-02-05 DIAGNOSIS — T8249XA Other complication of vascular dialysis catheter, initial encounter: Secondary | ICD-10-CM | POA: Diagnosis not present

## 2016-02-05 DIAGNOSIS — N186 End stage renal disease: Secondary | ICD-10-CM | POA: Diagnosis not present

## 2016-02-07 DIAGNOSIS — D509 Iron deficiency anemia, unspecified: Secondary | ICD-10-CM | POA: Diagnosis not present

## 2016-02-07 DIAGNOSIS — D688 Other specified coagulation defects: Secondary | ICD-10-CM | POA: Diagnosis not present

## 2016-02-07 DIAGNOSIS — T8249XA Other complication of vascular dialysis catheter, initial encounter: Secondary | ICD-10-CM | POA: Diagnosis not present

## 2016-02-07 DIAGNOSIS — E1129 Type 2 diabetes mellitus with other diabetic kidney complication: Secondary | ICD-10-CM | POA: Diagnosis not present

## 2016-02-07 DIAGNOSIS — D631 Anemia in chronic kidney disease: Secondary | ICD-10-CM | POA: Diagnosis not present

## 2016-02-07 DIAGNOSIS — N186 End stage renal disease: Secondary | ICD-10-CM | POA: Diagnosis not present

## 2016-02-09 ENCOUNTER — Encounter: Payer: Self-pay | Admitting: Vascular Surgery

## 2016-02-10 DIAGNOSIS — D688 Other specified coagulation defects: Secondary | ICD-10-CM | POA: Diagnosis not present

## 2016-02-10 DIAGNOSIS — N186 End stage renal disease: Secondary | ICD-10-CM | POA: Diagnosis not present

## 2016-02-10 DIAGNOSIS — D631 Anemia in chronic kidney disease: Secondary | ICD-10-CM | POA: Diagnosis not present

## 2016-02-10 DIAGNOSIS — T8249XA Other complication of vascular dialysis catheter, initial encounter: Secondary | ICD-10-CM | POA: Diagnosis not present

## 2016-02-10 DIAGNOSIS — D509 Iron deficiency anemia, unspecified: Secondary | ICD-10-CM | POA: Diagnosis not present

## 2016-02-10 DIAGNOSIS — E1129 Type 2 diabetes mellitus with other diabetic kidney complication: Secondary | ICD-10-CM | POA: Diagnosis not present

## 2016-02-11 ENCOUNTER — Ambulatory Visit (INDEPENDENT_AMBULATORY_CARE_PROVIDER_SITE_OTHER): Payer: Medicare Other | Admitting: Vascular Surgery

## 2016-02-11 ENCOUNTER — Ambulatory Visit (HOSPITAL_COMMUNITY)
Admission: RE | Admit: 2016-02-11 | Discharge: 2016-02-11 | Disposition: A | Discharge status: Home / Self Care | Payer: Medicare Other | Source: Ambulatory Visit | Attending: Vascular Surgery | Admitting: Vascular Surgery

## 2016-02-11 ENCOUNTER — Encounter: Payer: Self-pay | Admitting: Vascular Surgery

## 2016-02-11 VITALS — BP 190/78 | HR 65 | Temp 98.5°F | Resp 18 | Ht 62.0 in | Wt 239.2 lb

## 2016-02-11 DIAGNOSIS — Z4931 Encounter for adequacy testing for hemodialysis: Secondary | ICD-10-CM | POA: Insufficient documentation

## 2016-02-11 DIAGNOSIS — Z992 Dependence on renal dialysis: Secondary | ICD-10-CM

## 2016-02-11 DIAGNOSIS — N186 End stage renal disease: Secondary | ICD-10-CM | POA: Insufficient documentation

## 2016-02-11 NOTE — Progress Notes (Signed)
Patient is a 75 year old female returns for follow-up today after placement of a left brachiocephalic AV fistula is 4451. She denies any numbness or tingling in her hand. He currently is dialyzing via a right-sided catheter  Physical exam:  Vitals:   02/11/16 1546 02/11/16 1547  BP: (!) 190/71 (!) 190/78  Pulse: 65   Resp: 18   Temp: 98.5 F (36.9 C)   TempSrc: Oral   SpO2: 98%   Weight: 239 lb 3.2 oz (108.5 kg)   Height: 5\' 2"  (1.575 m)     Left upper extremity: Well-healed left antecubital incision palpable thrill in fistula; fistula is palpable into the mid upper arm.  Data: Patient of duplex ultrasound of AV fistula today. Fistula diameter was 6-7 mm less than 4 mm from the skin surface  Assessment: Maturing AV fistula left arm  Plan: Fistula should be ready for use around 03/06/2016. The patient will follow-up with me on an as-needed basis.  Ruta Hinds, MD [facility] Office: [phone] Pager: [phone]

## 2016-02-12 ENCOUNTER — Encounter: Payer: Self-pay | Admitting: Family Medicine

## 2016-02-12 DIAGNOSIS — D509 Iron deficiency anemia, unspecified: Secondary | ICD-10-CM | POA: Diagnosis not present

## 2016-02-12 DIAGNOSIS — T8249XA Other complication of vascular dialysis catheter, initial encounter: Secondary | ICD-10-CM | POA: Diagnosis not present

## 2016-02-12 DIAGNOSIS — D688 Other specified coagulation defects: Secondary | ICD-10-CM | POA: Diagnosis not present

## 2016-02-12 DIAGNOSIS — N186 End stage renal disease: Secondary | ICD-10-CM | POA: Diagnosis not present

## 2016-02-12 DIAGNOSIS — D631 Anemia in chronic kidney disease: Secondary | ICD-10-CM | POA: Diagnosis not present

## 2016-02-12 DIAGNOSIS — E1129 Type 2 diabetes mellitus with other diabetic kidney complication: Secondary | ICD-10-CM | POA: Diagnosis not present

## 2016-02-14 DIAGNOSIS — D509 Iron deficiency anemia, unspecified: Secondary | ICD-10-CM | POA: Diagnosis not present

## 2016-02-14 DIAGNOSIS — D631 Anemia in chronic kidney disease: Secondary | ICD-10-CM | POA: Diagnosis not present

## 2016-02-14 DIAGNOSIS — E1129 Type 2 diabetes mellitus with other diabetic kidney complication: Secondary | ICD-10-CM | POA: Diagnosis not present

## 2016-02-14 DIAGNOSIS — T8249XA Other complication of vascular dialysis catheter, initial encounter: Secondary | ICD-10-CM | POA: Diagnosis not present

## 2016-02-14 DIAGNOSIS — N186 End stage renal disease: Secondary | ICD-10-CM | POA: Diagnosis not present

## 2016-02-14 DIAGNOSIS — D688 Other specified coagulation defects: Secondary | ICD-10-CM | POA: Diagnosis not present

## 2016-02-17 DIAGNOSIS — Z992 Dependence on renal dialysis: Secondary | ICD-10-CM | POA: Diagnosis not present

## 2016-02-17 DIAGNOSIS — E1129 Type 2 diabetes mellitus with other diabetic kidney complication: Secondary | ICD-10-CM | POA: Diagnosis not present

## 2016-02-17 DIAGNOSIS — D631 Anemia in chronic kidney disease: Secondary | ICD-10-CM | POA: Diagnosis not present

## 2016-02-17 DIAGNOSIS — T8249XA Other complication of vascular dialysis catheter, initial encounter: Secondary | ICD-10-CM | POA: Diagnosis not present

## 2016-02-17 DIAGNOSIS — N186 End stage renal disease: Secondary | ICD-10-CM | POA: Diagnosis not present

## 2016-02-17 DIAGNOSIS — D688 Other specified coagulation defects: Secondary | ICD-10-CM | POA: Diagnosis not present

## 2016-02-17 DIAGNOSIS — D509 Iron deficiency anemia, unspecified: Secondary | ICD-10-CM | POA: Diagnosis not present

## 2016-02-19 DIAGNOSIS — D631 Anemia in chronic kidney disease: Secondary | ICD-10-CM | POA: Diagnosis not present

## 2016-02-19 DIAGNOSIS — N186 End stage renal disease: Secondary | ICD-10-CM | POA: Diagnosis not present

## 2016-02-19 DIAGNOSIS — E1129 Type 2 diabetes mellitus with other diabetic kidney complication: Secondary | ICD-10-CM | POA: Diagnosis not present

## 2016-02-19 DIAGNOSIS — N2581 Secondary hyperparathyroidism of renal origin: Secondary | ICD-10-CM | POA: Diagnosis not present

## 2016-02-19 DIAGNOSIS — D509 Iron deficiency anemia, unspecified: Secondary | ICD-10-CM | POA: Diagnosis not present

## 2016-02-19 DIAGNOSIS — D688 Other specified coagulation defects: Secondary | ICD-10-CM | POA: Diagnosis not present

## 2016-02-19 DIAGNOSIS — T8249XA Other complication of vascular dialysis catheter, initial encounter: Secondary | ICD-10-CM | POA: Diagnosis not present

## 2016-02-21 DIAGNOSIS — N2581 Secondary hyperparathyroidism of renal origin: Secondary | ICD-10-CM | POA: Diagnosis not present

## 2016-02-21 DIAGNOSIS — D509 Iron deficiency anemia, unspecified: Secondary | ICD-10-CM | POA: Diagnosis not present

## 2016-02-21 DIAGNOSIS — T8249XA Other complication of vascular dialysis catheter, initial encounter: Secondary | ICD-10-CM | POA: Diagnosis not present

## 2016-02-21 DIAGNOSIS — D631 Anemia in chronic kidney disease: Secondary | ICD-10-CM | POA: Diagnosis not present

## 2016-02-21 DIAGNOSIS — D688 Other specified coagulation defects: Secondary | ICD-10-CM | POA: Diagnosis not present

## 2016-02-21 DIAGNOSIS — E1129 Type 2 diabetes mellitus with other diabetic kidney complication: Secondary | ICD-10-CM | POA: Diagnosis not present

## 2016-02-21 DIAGNOSIS — N186 End stage renal disease: Secondary | ICD-10-CM | POA: Diagnosis not present

## 2016-02-24 DIAGNOSIS — D631 Anemia in chronic kidney disease: Secondary | ICD-10-CM | POA: Diagnosis not present

## 2016-02-24 DIAGNOSIS — T8249XA Other complication of vascular dialysis catheter, initial encounter: Secondary | ICD-10-CM | POA: Diagnosis not present

## 2016-02-24 DIAGNOSIS — E1129 Type 2 diabetes mellitus with other diabetic kidney complication: Secondary | ICD-10-CM | POA: Diagnosis not present

## 2016-02-24 DIAGNOSIS — N186 End stage renal disease: Secondary | ICD-10-CM | POA: Diagnosis not present

## 2016-02-24 DIAGNOSIS — N2581 Secondary hyperparathyroidism of renal origin: Secondary | ICD-10-CM | POA: Diagnosis not present

## 2016-02-24 DIAGNOSIS — D688 Other specified coagulation defects: Secondary | ICD-10-CM | POA: Diagnosis not present

## 2016-02-24 DIAGNOSIS — D509 Iron deficiency anemia, unspecified: Secondary | ICD-10-CM | POA: Diagnosis not present

## 2016-02-26 DIAGNOSIS — T8249XA Other complication of vascular dialysis catheter, initial encounter: Secondary | ICD-10-CM | POA: Diagnosis not present

## 2016-02-26 DIAGNOSIS — N186 End stage renal disease: Secondary | ICD-10-CM | POA: Diagnosis not present

## 2016-02-26 DIAGNOSIS — D631 Anemia in chronic kidney disease: Secondary | ICD-10-CM | POA: Diagnosis not present

## 2016-02-26 DIAGNOSIS — E1129 Type 2 diabetes mellitus with other diabetic kidney complication: Secondary | ICD-10-CM | POA: Diagnosis not present

## 2016-02-26 DIAGNOSIS — D688 Other specified coagulation defects: Secondary | ICD-10-CM | POA: Diagnosis not present

## 2016-02-26 DIAGNOSIS — N2581 Secondary hyperparathyroidism of renal origin: Secondary | ICD-10-CM | POA: Diagnosis not present

## 2016-02-26 DIAGNOSIS — D509 Iron deficiency anemia, unspecified: Secondary | ICD-10-CM | POA: Diagnosis not present

## 2016-02-28 DIAGNOSIS — N186 End stage renal disease: Secondary | ICD-10-CM | POA: Diagnosis not present

## 2016-02-28 DIAGNOSIS — D509 Iron deficiency anemia, unspecified: Secondary | ICD-10-CM | POA: Diagnosis not present

## 2016-02-28 DIAGNOSIS — D688 Other specified coagulation defects: Secondary | ICD-10-CM | POA: Diagnosis not present

## 2016-02-28 DIAGNOSIS — E1129 Type 2 diabetes mellitus with other diabetic kidney complication: Secondary | ICD-10-CM | POA: Diagnosis not present

## 2016-02-28 DIAGNOSIS — N2581 Secondary hyperparathyroidism of renal origin: Secondary | ICD-10-CM | POA: Diagnosis not present

## 2016-02-28 DIAGNOSIS — D631 Anemia in chronic kidney disease: Secondary | ICD-10-CM | POA: Diagnosis not present

## 2016-02-28 DIAGNOSIS — T8249XA Other complication of vascular dialysis catheter, initial encounter: Secondary | ICD-10-CM | POA: Diagnosis not present

## 2016-03-02 DIAGNOSIS — D631 Anemia in chronic kidney disease: Secondary | ICD-10-CM | POA: Diagnosis not present

## 2016-03-02 DIAGNOSIS — N2581 Secondary hyperparathyroidism of renal origin: Secondary | ICD-10-CM | POA: Diagnosis not present

## 2016-03-02 DIAGNOSIS — N186 End stage renal disease: Secondary | ICD-10-CM | POA: Diagnosis not present

## 2016-03-02 DIAGNOSIS — D509 Iron deficiency anemia, unspecified: Secondary | ICD-10-CM | POA: Diagnosis not present

## 2016-03-02 DIAGNOSIS — D688 Other specified coagulation defects: Secondary | ICD-10-CM | POA: Diagnosis not present

## 2016-03-02 DIAGNOSIS — E1129 Type 2 diabetes mellitus with other diabetic kidney complication: Secondary | ICD-10-CM | POA: Diagnosis not present

## 2016-03-02 DIAGNOSIS — T8249XA Other complication of vascular dialysis catheter, initial encounter: Secondary | ICD-10-CM | POA: Diagnosis not present

## 2016-03-04 DIAGNOSIS — N2581 Secondary hyperparathyroidism of renal origin: Secondary | ICD-10-CM | POA: Diagnosis not present

## 2016-03-04 DIAGNOSIS — E1129 Type 2 diabetes mellitus with other diabetic kidney complication: Secondary | ICD-10-CM | POA: Diagnosis not present

## 2016-03-04 DIAGNOSIS — N186 End stage renal disease: Secondary | ICD-10-CM | POA: Diagnosis not present

## 2016-03-04 DIAGNOSIS — D688 Other specified coagulation defects: Secondary | ICD-10-CM | POA: Diagnosis not present

## 2016-03-04 DIAGNOSIS — D631 Anemia in chronic kidney disease: Secondary | ICD-10-CM | POA: Diagnosis not present

## 2016-03-04 DIAGNOSIS — T8249XA Other complication of vascular dialysis catheter, initial encounter: Secondary | ICD-10-CM | POA: Diagnosis not present

## 2016-03-04 DIAGNOSIS — D509 Iron deficiency anemia, unspecified: Secondary | ICD-10-CM | POA: Diagnosis not present

## 2016-03-06 DIAGNOSIS — D509 Iron deficiency anemia, unspecified: Secondary | ICD-10-CM | POA: Diagnosis not present

## 2016-03-06 DIAGNOSIS — D631 Anemia in chronic kidney disease: Secondary | ICD-10-CM | POA: Diagnosis not present

## 2016-03-06 DIAGNOSIS — E1129 Type 2 diabetes mellitus with other diabetic kidney complication: Secondary | ICD-10-CM | POA: Diagnosis not present

## 2016-03-06 DIAGNOSIS — T8249XA Other complication of vascular dialysis catheter, initial encounter: Secondary | ICD-10-CM | POA: Diagnosis not present

## 2016-03-06 DIAGNOSIS — N186 End stage renal disease: Secondary | ICD-10-CM | POA: Diagnosis not present

## 2016-03-06 DIAGNOSIS — N2581 Secondary hyperparathyroidism of renal origin: Secondary | ICD-10-CM | POA: Diagnosis not present

## 2016-03-06 DIAGNOSIS — D688 Other specified coagulation defects: Secondary | ICD-10-CM | POA: Diagnosis not present

## 2016-03-08 DIAGNOSIS — D631 Anemia in chronic kidney disease: Secondary | ICD-10-CM | POA: Diagnosis not present

## 2016-03-08 DIAGNOSIS — E1129 Type 2 diabetes mellitus with other diabetic kidney complication: Secondary | ICD-10-CM | POA: Diagnosis not present

## 2016-03-08 DIAGNOSIS — D688 Other specified coagulation defects: Secondary | ICD-10-CM | POA: Diagnosis not present

## 2016-03-08 DIAGNOSIS — N2581 Secondary hyperparathyroidism of renal origin: Secondary | ICD-10-CM | POA: Diagnosis not present

## 2016-03-08 DIAGNOSIS — N186 End stage renal disease: Secondary | ICD-10-CM | POA: Diagnosis not present

## 2016-03-08 DIAGNOSIS — D509 Iron deficiency anemia, unspecified: Secondary | ICD-10-CM | POA: Diagnosis not present

## 2016-03-08 DIAGNOSIS — T8249XA Other complication of vascular dialysis catheter, initial encounter: Secondary | ICD-10-CM | POA: Diagnosis not present

## 2016-03-10 DIAGNOSIS — T8249XA Other complication of vascular dialysis catheter, initial encounter: Secondary | ICD-10-CM | POA: Diagnosis not present

## 2016-03-10 DIAGNOSIS — D688 Other specified coagulation defects: Secondary | ICD-10-CM | POA: Diagnosis not present

## 2016-03-10 DIAGNOSIS — N2581 Secondary hyperparathyroidism of renal origin: Secondary | ICD-10-CM | POA: Diagnosis not present

## 2016-03-10 DIAGNOSIS — N186 End stage renal disease: Secondary | ICD-10-CM | POA: Diagnosis not present

## 2016-03-10 DIAGNOSIS — E1129 Type 2 diabetes mellitus with other diabetic kidney complication: Secondary | ICD-10-CM | POA: Diagnosis not present

## 2016-03-10 DIAGNOSIS — D509 Iron deficiency anemia, unspecified: Secondary | ICD-10-CM | POA: Diagnosis not present

## 2016-03-10 DIAGNOSIS — D631 Anemia in chronic kidney disease: Secondary | ICD-10-CM | POA: Diagnosis not present

## 2016-03-13 DIAGNOSIS — E1129 Type 2 diabetes mellitus with other diabetic kidney complication: Secondary | ICD-10-CM | POA: Diagnosis not present

## 2016-03-13 DIAGNOSIS — D631 Anemia in chronic kidney disease: Secondary | ICD-10-CM | POA: Diagnosis not present

## 2016-03-13 DIAGNOSIS — T8249XA Other complication of vascular dialysis catheter, initial encounter: Secondary | ICD-10-CM | POA: Diagnosis not present

## 2016-03-13 DIAGNOSIS — D509 Iron deficiency anemia, unspecified: Secondary | ICD-10-CM | POA: Diagnosis not present

## 2016-03-13 DIAGNOSIS — D688 Other specified coagulation defects: Secondary | ICD-10-CM | POA: Diagnosis not present

## 2016-03-13 DIAGNOSIS — N2581 Secondary hyperparathyroidism of renal origin: Secondary | ICD-10-CM | POA: Diagnosis not present

## 2016-03-13 DIAGNOSIS — N186 End stage renal disease: Secondary | ICD-10-CM | POA: Diagnosis not present

## 2016-03-16 DIAGNOSIS — E1129 Type 2 diabetes mellitus with other diabetic kidney complication: Secondary | ICD-10-CM | POA: Diagnosis not present

## 2016-03-16 DIAGNOSIS — N2581 Secondary hyperparathyroidism of renal origin: Secondary | ICD-10-CM | POA: Diagnosis not present

## 2016-03-16 DIAGNOSIS — T8249XA Other complication of vascular dialysis catheter, initial encounter: Secondary | ICD-10-CM | POA: Diagnosis not present

## 2016-03-16 DIAGNOSIS — N186 End stage renal disease: Secondary | ICD-10-CM | POA: Diagnosis not present

## 2016-03-16 DIAGNOSIS — D509 Iron deficiency anemia, unspecified: Secondary | ICD-10-CM | POA: Diagnosis not present

## 2016-03-16 DIAGNOSIS — D688 Other specified coagulation defects: Secondary | ICD-10-CM | POA: Diagnosis not present

## 2016-03-16 DIAGNOSIS — D631 Anemia in chronic kidney disease: Secondary | ICD-10-CM | POA: Diagnosis not present

## 2016-03-18 DIAGNOSIS — D509 Iron deficiency anemia, unspecified: Secondary | ICD-10-CM | POA: Diagnosis not present

## 2016-03-18 DIAGNOSIS — D631 Anemia in chronic kidney disease: Secondary | ICD-10-CM | POA: Diagnosis not present

## 2016-03-18 DIAGNOSIS — N2581 Secondary hyperparathyroidism of renal origin: Secondary | ICD-10-CM | POA: Diagnosis not present

## 2016-03-18 DIAGNOSIS — D688 Other specified coagulation defects: Secondary | ICD-10-CM | POA: Diagnosis not present

## 2016-03-18 DIAGNOSIS — E1129 Type 2 diabetes mellitus with other diabetic kidney complication: Secondary | ICD-10-CM | POA: Diagnosis not present

## 2016-03-18 DIAGNOSIS — Z992 Dependence on renal dialysis: Secondary | ICD-10-CM | POA: Diagnosis not present

## 2016-03-18 DIAGNOSIS — N186 End stage renal disease: Secondary | ICD-10-CM | POA: Diagnosis not present

## 2016-03-18 DIAGNOSIS — T8249XA Other complication of vascular dialysis catheter, initial encounter: Secondary | ICD-10-CM | POA: Diagnosis not present

## 2016-03-20 DIAGNOSIS — D688 Other specified coagulation defects: Secondary | ICD-10-CM | POA: Diagnosis not present

## 2016-03-20 DIAGNOSIS — N2581 Secondary hyperparathyroidism of renal origin: Secondary | ICD-10-CM | POA: Diagnosis not present

## 2016-03-20 DIAGNOSIS — T8249XA Other complication of vascular dialysis catheter, initial encounter: Secondary | ICD-10-CM | POA: Diagnosis not present

## 2016-03-20 DIAGNOSIS — D509 Iron deficiency anemia, unspecified: Secondary | ICD-10-CM | POA: Diagnosis not present

## 2016-03-20 DIAGNOSIS — N186 End stage renal disease: Secondary | ICD-10-CM | POA: Diagnosis not present

## 2016-03-20 DIAGNOSIS — E1129 Type 2 diabetes mellitus with other diabetic kidney complication: Secondary | ICD-10-CM | POA: Diagnosis not present

## 2016-03-23 DIAGNOSIS — E1129 Type 2 diabetes mellitus with other diabetic kidney complication: Secondary | ICD-10-CM | POA: Diagnosis not present

## 2016-03-23 DIAGNOSIS — N186 End stage renal disease: Secondary | ICD-10-CM | POA: Diagnosis not present

## 2016-03-23 DIAGNOSIS — N2581 Secondary hyperparathyroidism of renal origin: Secondary | ICD-10-CM | POA: Diagnosis not present

## 2016-03-23 DIAGNOSIS — D688 Other specified coagulation defects: Secondary | ICD-10-CM | POA: Diagnosis not present

## 2016-03-23 DIAGNOSIS — T8249XA Other complication of vascular dialysis catheter, initial encounter: Secondary | ICD-10-CM | POA: Diagnosis not present

## 2016-03-23 DIAGNOSIS — D509 Iron deficiency anemia, unspecified: Secondary | ICD-10-CM | POA: Diagnosis not present

## 2016-03-25 DIAGNOSIS — N186 End stage renal disease: Secondary | ICD-10-CM | POA: Diagnosis not present

## 2016-03-25 DIAGNOSIS — N2581 Secondary hyperparathyroidism of renal origin: Secondary | ICD-10-CM | POA: Diagnosis not present

## 2016-03-25 DIAGNOSIS — E1129 Type 2 diabetes mellitus with other diabetic kidney complication: Secondary | ICD-10-CM | POA: Diagnosis not present

## 2016-03-25 DIAGNOSIS — D688 Other specified coagulation defects: Secondary | ICD-10-CM | POA: Diagnosis not present

## 2016-03-25 DIAGNOSIS — D509 Iron deficiency anemia, unspecified: Secondary | ICD-10-CM | POA: Diagnosis not present

## 2016-03-25 DIAGNOSIS — T8249XA Other complication of vascular dialysis catheter, initial encounter: Secondary | ICD-10-CM | POA: Diagnosis not present

## 2016-03-27 DIAGNOSIS — D509 Iron deficiency anemia, unspecified: Secondary | ICD-10-CM | POA: Diagnosis not present

## 2016-03-27 DIAGNOSIS — T8249XA Other complication of vascular dialysis catheter, initial encounter: Secondary | ICD-10-CM | POA: Diagnosis not present

## 2016-03-27 DIAGNOSIS — E1129 Type 2 diabetes mellitus with other diabetic kidney complication: Secondary | ICD-10-CM | POA: Diagnosis not present

## 2016-03-27 DIAGNOSIS — N186 End stage renal disease: Secondary | ICD-10-CM | POA: Diagnosis not present

## 2016-03-27 DIAGNOSIS — N2581 Secondary hyperparathyroidism of renal origin: Secondary | ICD-10-CM | POA: Diagnosis not present

## 2016-03-27 DIAGNOSIS — D688 Other specified coagulation defects: Secondary | ICD-10-CM | POA: Diagnosis not present

## 2016-03-30 DIAGNOSIS — T8249XA Other complication of vascular dialysis catheter, initial encounter: Secondary | ICD-10-CM | POA: Diagnosis not present

## 2016-03-30 DIAGNOSIS — E1129 Type 2 diabetes mellitus with other diabetic kidney complication: Secondary | ICD-10-CM | POA: Diagnosis not present

## 2016-03-30 DIAGNOSIS — N2581 Secondary hyperparathyroidism of renal origin: Secondary | ICD-10-CM | POA: Diagnosis not present

## 2016-03-30 DIAGNOSIS — N186 End stage renal disease: Secondary | ICD-10-CM | POA: Diagnosis not present

## 2016-03-30 DIAGNOSIS — D688 Other specified coagulation defects: Secondary | ICD-10-CM | POA: Diagnosis not present

## 2016-03-30 DIAGNOSIS — D509 Iron deficiency anemia, unspecified: Secondary | ICD-10-CM | POA: Diagnosis not present

## 2016-04-01 DIAGNOSIS — N2581 Secondary hyperparathyroidism of renal origin: Secondary | ICD-10-CM | POA: Diagnosis not present

## 2016-04-01 DIAGNOSIS — D688 Other specified coagulation defects: Secondary | ICD-10-CM | POA: Diagnosis not present

## 2016-04-01 DIAGNOSIS — N186 End stage renal disease: Secondary | ICD-10-CM | POA: Diagnosis not present

## 2016-04-01 DIAGNOSIS — E1129 Type 2 diabetes mellitus with other diabetic kidney complication: Secondary | ICD-10-CM | POA: Diagnosis not present

## 2016-04-01 DIAGNOSIS — D509 Iron deficiency anemia, unspecified: Secondary | ICD-10-CM | POA: Diagnosis not present

## 2016-04-01 DIAGNOSIS — T8249XA Other complication of vascular dialysis catheter, initial encounter: Secondary | ICD-10-CM | POA: Diagnosis not present

## 2016-04-03 DIAGNOSIS — N186 End stage renal disease: Secondary | ICD-10-CM | POA: Diagnosis not present

## 2016-04-03 DIAGNOSIS — D509 Iron deficiency anemia, unspecified: Secondary | ICD-10-CM | POA: Diagnosis not present

## 2016-04-03 DIAGNOSIS — D688 Other specified coagulation defects: Secondary | ICD-10-CM | POA: Diagnosis not present

## 2016-04-03 DIAGNOSIS — N2581 Secondary hyperparathyroidism of renal origin: Secondary | ICD-10-CM | POA: Diagnosis not present

## 2016-04-03 DIAGNOSIS — T8249XA Other complication of vascular dialysis catheter, initial encounter: Secondary | ICD-10-CM | POA: Diagnosis not present

## 2016-04-03 DIAGNOSIS — E1129 Type 2 diabetes mellitus with other diabetic kidney complication: Secondary | ICD-10-CM | POA: Diagnosis not present

## 2016-04-06 DIAGNOSIS — T8249XA Other complication of vascular dialysis catheter, initial encounter: Secondary | ICD-10-CM | POA: Diagnosis not present

## 2016-04-06 DIAGNOSIS — E1129 Type 2 diabetes mellitus with other diabetic kidney complication: Secondary | ICD-10-CM | POA: Diagnosis not present

## 2016-04-06 DIAGNOSIS — D688 Other specified coagulation defects: Secondary | ICD-10-CM | POA: Diagnosis not present

## 2016-04-06 DIAGNOSIS — N2581 Secondary hyperparathyroidism of renal origin: Secondary | ICD-10-CM | POA: Diagnosis not present

## 2016-04-06 DIAGNOSIS — N186 End stage renal disease: Secondary | ICD-10-CM | POA: Diagnosis not present

## 2016-04-06 DIAGNOSIS — D509 Iron deficiency anemia, unspecified: Secondary | ICD-10-CM | POA: Diagnosis not present

## 2016-04-08 DIAGNOSIS — E1129 Type 2 diabetes mellitus with other diabetic kidney complication: Secondary | ICD-10-CM | POA: Diagnosis not present

## 2016-04-08 DIAGNOSIS — T8249XA Other complication of vascular dialysis catheter, initial encounter: Secondary | ICD-10-CM | POA: Diagnosis not present

## 2016-04-08 DIAGNOSIS — D688 Other specified coagulation defects: Secondary | ICD-10-CM | POA: Diagnosis not present

## 2016-04-08 DIAGNOSIS — N2581 Secondary hyperparathyroidism of renal origin: Secondary | ICD-10-CM | POA: Diagnosis not present

## 2016-04-08 DIAGNOSIS — N186 End stage renal disease: Secondary | ICD-10-CM | POA: Diagnosis not present

## 2016-04-08 DIAGNOSIS — D509 Iron deficiency anemia, unspecified: Secondary | ICD-10-CM | POA: Diagnosis not present

## 2016-04-10 DIAGNOSIS — T8249XA Other complication of vascular dialysis catheter, initial encounter: Secondary | ICD-10-CM | POA: Diagnosis not present

## 2016-04-10 DIAGNOSIS — N186 End stage renal disease: Secondary | ICD-10-CM | POA: Diagnosis not present

## 2016-04-10 DIAGNOSIS — N2581 Secondary hyperparathyroidism of renal origin: Secondary | ICD-10-CM | POA: Diagnosis not present

## 2016-04-10 DIAGNOSIS — D688 Other specified coagulation defects: Secondary | ICD-10-CM | POA: Diagnosis not present

## 2016-04-10 DIAGNOSIS — D509 Iron deficiency anemia, unspecified: Secondary | ICD-10-CM | POA: Diagnosis not present

## 2016-04-10 DIAGNOSIS — E1129 Type 2 diabetes mellitus with other diabetic kidney complication: Secondary | ICD-10-CM | POA: Diagnosis not present

## 2016-04-13 DIAGNOSIS — E1129 Type 2 diabetes mellitus with other diabetic kidney complication: Secondary | ICD-10-CM | POA: Diagnosis not present

## 2016-04-13 DIAGNOSIS — N2581 Secondary hyperparathyroidism of renal origin: Secondary | ICD-10-CM | POA: Diagnosis not present

## 2016-04-13 DIAGNOSIS — T8249XA Other complication of vascular dialysis catheter, initial encounter: Secondary | ICD-10-CM | POA: Diagnosis not present

## 2016-04-13 DIAGNOSIS — D509 Iron deficiency anemia, unspecified: Secondary | ICD-10-CM | POA: Diagnosis not present

## 2016-04-13 DIAGNOSIS — N186 End stage renal disease: Secondary | ICD-10-CM | POA: Diagnosis not present

## 2016-04-13 DIAGNOSIS — D688 Other specified coagulation defects: Secondary | ICD-10-CM | POA: Diagnosis not present

## 2016-04-15 DIAGNOSIS — N2581 Secondary hyperparathyroidism of renal origin: Secondary | ICD-10-CM | POA: Diagnosis not present

## 2016-04-15 DIAGNOSIS — D509 Iron deficiency anemia, unspecified: Secondary | ICD-10-CM | POA: Diagnosis not present

## 2016-04-15 DIAGNOSIS — T8249XA Other complication of vascular dialysis catheter, initial encounter: Secondary | ICD-10-CM | POA: Diagnosis not present

## 2016-04-15 DIAGNOSIS — E1129 Type 2 diabetes mellitus with other diabetic kidney complication: Secondary | ICD-10-CM | POA: Diagnosis not present

## 2016-04-15 DIAGNOSIS — D688 Other specified coagulation defects: Secondary | ICD-10-CM | POA: Diagnosis not present

## 2016-04-15 DIAGNOSIS — N186 End stage renal disease: Secondary | ICD-10-CM | POA: Diagnosis not present

## 2016-04-17 DIAGNOSIS — D688 Other specified coagulation defects: Secondary | ICD-10-CM | POA: Diagnosis not present

## 2016-04-17 DIAGNOSIS — N186 End stage renal disease: Secondary | ICD-10-CM | POA: Diagnosis not present

## 2016-04-17 DIAGNOSIS — N2581 Secondary hyperparathyroidism of renal origin: Secondary | ICD-10-CM | POA: Diagnosis not present

## 2016-04-17 DIAGNOSIS — E1129 Type 2 diabetes mellitus with other diabetic kidney complication: Secondary | ICD-10-CM | POA: Diagnosis not present

## 2016-04-17 DIAGNOSIS — D509 Iron deficiency anemia, unspecified: Secondary | ICD-10-CM | POA: Diagnosis not present

## 2016-04-17 DIAGNOSIS — T8249XA Other complication of vascular dialysis catheter, initial encounter: Secondary | ICD-10-CM | POA: Diagnosis not present

## 2016-04-18 DIAGNOSIS — N186 End stage renal disease: Secondary | ICD-10-CM | POA: Diagnosis not present

## 2016-04-18 DIAGNOSIS — E1129 Type 2 diabetes mellitus with other diabetic kidney complication: Secondary | ICD-10-CM | POA: Diagnosis not present

## 2016-04-18 DIAGNOSIS — Z992 Dependence on renal dialysis: Secondary | ICD-10-CM | POA: Diagnosis not present

## 2016-04-20 DIAGNOSIS — D696 Thrombocytopenia, unspecified: Secondary | ICD-10-CM | POA: Diagnosis not present

## 2016-04-20 DIAGNOSIS — N2581 Secondary hyperparathyroidism of renal origin: Secondary | ICD-10-CM | POA: Diagnosis not present

## 2016-04-20 DIAGNOSIS — D509 Iron deficiency anemia, unspecified: Secondary | ICD-10-CM | POA: Diagnosis not present

## 2016-04-20 DIAGNOSIS — T8249XA Other complication of vascular dialysis catheter, initial encounter: Secondary | ICD-10-CM | POA: Diagnosis not present

## 2016-04-20 DIAGNOSIS — E1129 Type 2 diabetes mellitus with other diabetic kidney complication: Secondary | ICD-10-CM | POA: Diagnosis not present

## 2016-04-20 DIAGNOSIS — N186 End stage renal disease: Secondary | ICD-10-CM | POA: Diagnosis not present

## 2016-04-20 DIAGNOSIS — D688 Other specified coagulation defects: Secondary | ICD-10-CM | POA: Diagnosis not present

## 2016-04-22 DIAGNOSIS — N2581 Secondary hyperparathyroidism of renal origin: Secondary | ICD-10-CM | POA: Diagnosis not present

## 2016-04-22 DIAGNOSIS — E1129 Type 2 diabetes mellitus with other diabetic kidney complication: Secondary | ICD-10-CM | POA: Diagnosis not present

## 2016-04-22 DIAGNOSIS — D509 Iron deficiency anemia, unspecified: Secondary | ICD-10-CM | POA: Diagnosis not present

## 2016-04-22 DIAGNOSIS — D688 Other specified coagulation defects: Secondary | ICD-10-CM | POA: Diagnosis not present

## 2016-04-22 DIAGNOSIS — D696 Thrombocytopenia, unspecified: Secondary | ICD-10-CM | POA: Diagnosis not present

## 2016-04-22 DIAGNOSIS — T8249XA Other complication of vascular dialysis catheter, initial encounter: Secondary | ICD-10-CM | POA: Diagnosis not present

## 2016-04-22 DIAGNOSIS — N186 End stage renal disease: Secondary | ICD-10-CM | POA: Diagnosis not present

## 2016-04-23 ENCOUNTER — Ambulatory Visit: Payer: Medicare Other | Admitting: Podiatry

## 2016-04-24 DIAGNOSIS — N2581 Secondary hyperparathyroidism of renal origin: Secondary | ICD-10-CM | POA: Diagnosis not present

## 2016-04-24 DIAGNOSIS — E1129 Type 2 diabetes mellitus with other diabetic kidney complication: Secondary | ICD-10-CM | POA: Diagnosis not present

## 2016-04-24 DIAGNOSIS — N186 End stage renal disease: Secondary | ICD-10-CM | POA: Diagnosis not present

## 2016-04-24 DIAGNOSIS — T8249XA Other complication of vascular dialysis catheter, initial encounter: Secondary | ICD-10-CM | POA: Diagnosis not present

## 2016-04-24 DIAGNOSIS — D696 Thrombocytopenia, unspecified: Secondary | ICD-10-CM | POA: Diagnosis not present

## 2016-04-24 DIAGNOSIS — D509 Iron deficiency anemia, unspecified: Secondary | ICD-10-CM | POA: Diagnosis not present

## 2016-04-24 DIAGNOSIS — D688 Other specified coagulation defects: Secondary | ICD-10-CM | POA: Diagnosis not present

## 2016-04-25 ENCOUNTER — Other Ambulatory Visit: Payer: Self-pay | Admitting: Family Medicine

## 2016-04-26 NOTE — Telephone Encounter (Signed)
Rx filled. Patient needs appointment for further refills.  [name]. Jerline Pain, [facility] Resident PGY-3 04/26/2016 1:40 PM

## 2016-04-27 DIAGNOSIS — T8249XA Other complication of vascular dialysis catheter, initial encounter: Secondary | ICD-10-CM | POA: Diagnosis not present

## 2016-04-27 DIAGNOSIS — N186 End stage renal disease: Secondary | ICD-10-CM | POA: Diagnosis not present

## 2016-04-27 DIAGNOSIS — D688 Other specified coagulation defects: Secondary | ICD-10-CM | POA: Diagnosis not present

## 2016-04-27 DIAGNOSIS — D696 Thrombocytopenia, unspecified: Secondary | ICD-10-CM | POA: Diagnosis not present

## 2016-04-27 DIAGNOSIS — D509 Iron deficiency anemia, unspecified: Secondary | ICD-10-CM | POA: Diagnosis not present

## 2016-04-27 DIAGNOSIS — N2581 Secondary hyperparathyroidism of renal origin: Secondary | ICD-10-CM | POA: Diagnosis not present

## 2016-04-27 DIAGNOSIS — E1129 Type 2 diabetes mellitus with other diabetic kidney complication: Secondary | ICD-10-CM | POA: Diagnosis not present

## 2016-04-29 DIAGNOSIS — E1129 Type 2 diabetes mellitus with other diabetic kidney complication: Secondary | ICD-10-CM | POA: Diagnosis not present

## 2016-04-29 DIAGNOSIS — T8249XA Other complication of vascular dialysis catheter, initial encounter: Secondary | ICD-10-CM | POA: Diagnosis not present

## 2016-04-29 DIAGNOSIS — D509 Iron deficiency anemia, unspecified: Secondary | ICD-10-CM | POA: Diagnosis not present

## 2016-04-29 DIAGNOSIS — D688 Other specified coagulation defects: Secondary | ICD-10-CM | POA: Diagnosis not present

## 2016-04-29 DIAGNOSIS — D696 Thrombocytopenia, unspecified: Secondary | ICD-10-CM | POA: Diagnosis not present

## 2016-04-29 DIAGNOSIS — N186 End stage renal disease: Secondary | ICD-10-CM | POA: Diagnosis not present

## 2016-04-29 DIAGNOSIS — N2581 Secondary hyperparathyroidism of renal origin: Secondary | ICD-10-CM | POA: Diagnosis not present

## 2016-05-01 DIAGNOSIS — E1129 Type 2 diabetes mellitus with other diabetic kidney complication: Secondary | ICD-10-CM | POA: Diagnosis not present

## 2016-05-01 DIAGNOSIS — N186 End stage renal disease: Secondary | ICD-10-CM | POA: Diagnosis not present

## 2016-05-01 DIAGNOSIS — D688 Other specified coagulation defects: Secondary | ICD-10-CM | POA: Diagnosis not present

## 2016-05-01 DIAGNOSIS — N2581 Secondary hyperparathyroidism of renal origin: Secondary | ICD-10-CM | POA: Diagnosis not present

## 2016-05-01 DIAGNOSIS — D696 Thrombocytopenia, unspecified: Secondary | ICD-10-CM | POA: Diagnosis not present

## 2016-05-01 DIAGNOSIS — D509 Iron deficiency anemia, unspecified: Secondary | ICD-10-CM | POA: Diagnosis not present

## 2016-05-01 DIAGNOSIS — T8249XA Other complication of vascular dialysis catheter, initial encounter: Secondary | ICD-10-CM | POA: Diagnosis not present

## 2016-05-04 DIAGNOSIS — N186 End stage renal disease: Secondary | ICD-10-CM | POA: Diagnosis not present

## 2016-05-04 DIAGNOSIS — E1129 Type 2 diabetes mellitus with other diabetic kidney complication: Secondary | ICD-10-CM | POA: Diagnosis not present

## 2016-05-04 DIAGNOSIS — D509 Iron deficiency anemia, unspecified: Secondary | ICD-10-CM | POA: Diagnosis not present

## 2016-05-04 DIAGNOSIS — N2581 Secondary hyperparathyroidism of renal origin: Secondary | ICD-10-CM | POA: Diagnosis not present

## 2016-05-04 DIAGNOSIS — D688 Other specified coagulation defects: Secondary | ICD-10-CM | POA: Diagnosis not present

## 2016-05-04 DIAGNOSIS — D696 Thrombocytopenia, unspecified: Secondary | ICD-10-CM | POA: Diagnosis not present

## 2016-05-04 DIAGNOSIS — T8249XA Other complication of vascular dialysis catheter, initial encounter: Secondary | ICD-10-CM | POA: Diagnosis not present

## 2016-05-06 DIAGNOSIS — T8249XA Other complication of vascular dialysis catheter, initial encounter: Secondary | ICD-10-CM | POA: Diagnosis not present

## 2016-05-06 DIAGNOSIS — N2581 Secondary hyperparathyroidism of renal origin: Secondary | ICD-10-CM | POA: Diagnosis not present

## 2016-05-06 DIAGNOSIS — D509 Iron deficiency anemia, unspecified: Secondary | ICD-10-CM | POA: Diagnosis not present

## 2016-05-06 DIAGNOSIS — D696 Thrombocytopenia, unspecified: Secondary | ICD-10-CM | POA: Diagnosis not present

## 2016-05-06 DIAGNOSIS — E1129 Type 2 diabetes mellitus with other diabetic kidney complication: Secondary | ICD-10-CM | POA: Diagnosis not present

## 2016-05-06 DIAGNOSIS — D688 Other specified coagulation defects: Secondary | ICD-10-CM | POA: Diagnosis not present

## 2016-05-06 DIAGNOSIS — N186 End stage renal disease: Secondary | ICD-10-CM | POA: Diagnosis not present

## 2016-05-08 DIAGNOSIS — D509 Iron deficiency anemia, unspecified: Secondary | ICD-10-CM | POA: Diagnosis not present

## 2016-05-08 DIAGNOSIS — D696 Thrombocytopenia, unspecified: Secondary | ICD-10-CM | POA: Diagnosis not present

## 2016-05-08 DIAGNOSIS — D688 Other specified coagulation defects: Secondary | ICD-10-CM | POA: Diagnosis not present

## 2016-05-08 DIAGNOSIS — E1129 Type 2 diabetes mellitus with other diabetic kidney complication: Secondary | ICD-10-CM | POA: Diagnosis not present

## 2016-05-08 DIAGNOSIS — T8249XA Other complication of vascular dialysis catheter, initial encounter: Secondary | ICD-10-CM | POA: Diagnosis not present

## 2016-05-08 DIAGNOSIS — N2581 Secondary hyperparathyroidism of renal origin: Secondary | ICD-10-CM | POA: Diagnosis not present

## 2016-05-08 DIAGNOSIS — N186 End stage renal disease: Secondary | ICD-10-CM | POA: Diagnosis not present

## 2016-05-10 DIAGNOSIS — N186 End stage renal disease: Secondary | ICD-10-CM | POA: Diagnosis not present

## 2016-05-10 DIAGNOSIS — Z992 Dependence on renal dialysis: Secondary | ICD-10-CM | POA: Diagnosis not present

## 2016-05-10 DIAGNOSIS — T82858A Stenosis of vascular prosthetic devices, implants and grafts, initial encounter: Secondary | ICD-10-CM | POA: Diagnosis not present

## 2016-05-10 DIAGNOSIS — D696 Thrombocytopenia, unspecified: Secondary | ICD-10-CM | POA: Insufficient documentation

## 2016-05-10 DIAGNOSIS — I871 Compression of vein: Secondary | ICD-10-CM | POA: Diagnosis not present

## 2016-05-11 DIAGNOSIS — N2581 Secondary hyperparathyroidism of renal origin: Secondary | ICD-10-CM | POA: Diagnosis not present

## 2016-05-11 DIAGNOSIS — E1129 Type 2 diabetes mellitus with other diabetic kidney complication: Secondary | ICD-10-CM | POA: Diagnosis not present

## 2016-05-11 DIAGNOSIS — D696 Thrombocytopenia, unspecified: Secondary | ICD-10-CM | POA: Diagnosis not present

## 2016-05-11 DIAGNOSIS — D509 Iron deficiency anemia, unspecified: Secondary | ICD-10-CM | POA: Diagnosis not present

## 2016-05-11 DIAGNOSIS — N186 End stage renal disease: Secondary | ICD-10-CM | POA: Diagnosis not present

## 2016-05-11 DIAGNOSIS — T8249XA Other complication of vascular dialysis catheter, initial encounter: Secondary | ICD-10-CM | POA: Diagnosis not present

## 2016-05-11 DIAGNOSIS — D688 Other specified coagulation defects: Secondary | ICD-10-CM | POA: Diagnosis not present

## 2016-05-13 DIAGNOSIS — N2581 Secondary hyperparathyroidism of renal origin: Secondary | ICD-10-CM | POA: Diagnosis not present

## 2016-05-13 DIAGNOSIS — D688 Other specified coagulation defects: Secondary | ICD-10-CM | POA: Diagnosis not present

## 2016-05-13 DIAGNOSIS — D509 Iron deficiency anemia, unspecified: Secondary | ICD-10-CM | POA: Diagnosis not present

## 2016-05-13 DIAGNOSIS — N186 End stage renal disease: Secondary | ICD-10-CM | POA: Diagnosis not present

## 2016-05-13 DIAGNOSIS — E1129 Type 2 diabetes mellitus with other diabetic kidney complication: Secondary | ICD-10-CM | POA: Diagnosis not present

## 2016-05-13 DIAGNOSIS — D696 Thrombocytopenia, unspecified: Secondary | ICD-10-CM | POA: Diagnosis not present

## 2016-05-13 DIAGNOSIS — T8249XA Other complication of vascular dialysis catheter, initial encounter: Secondary | ICD-10-CM | POA: Diagnosis not present

## 2016-05-14 ENCOUNTER — Encounter: Payer: Self-pay | Admitting: Podiatry

## 2016-05-14 ENCOUNTER — Ambulatory Visit (INDEPENDENT_AMBULATORY_CARE_PROVIDER_SITE_OTHER): Payer: Medicare Other | Admitting: Podiatry

## 2016-05-14 VITALS — Ht 62.0 in | Wt 239.0 lb

## 2016-05-14 DIAGNOSIS — M79609 Pain in unspecified limb: Secondary | ICD-10-CM

## 2016-05-14 DIAGNOSIS — B351 Tinea unguium: Secondary | ICD-10-CM

## 2016-05-14 DIAGNOSIS — E1140 Type 2 diabetes mellitus with diabetic neuropathy, unspecified: Secondary | ICD-10-CM | POA: Diagnosis not present

## 2016-05-14 NOTE — Progress Notes (Signed)
Patient ID: Rachel Hobbs, female   DOB: 04-15-1941, 76 y.o.   MRN: [id number] HPI  Complaint:  Visit Type: Patient returns to my office for continued preventative foot care services. Complaint: Patient states" my nails have grown long and thick and become painful to walk and wear shoes" Patient has been diagnosed with DM . This patient  presents for preventative foot care services. No changes to ROS  Podiatric Exam: Vascular: dorsalis pedis and posterior tibial pulses are non palpable due to swelling both feet.. Capillary return is slow to refill. Temperature gradient is negative. Skin turgor WNL, bilateral swelling  Sensorium: Diminished  Semmes Weinstein monofilament test. Normal tactile sensation bilaterally.  Nail Exam: Pt has thick disfigured discolored nails with subungual debris noted bilateral entire nail hallux through fifth toenails Ulcer Exam: There is no evidence of ulcer or pre-ulcerative changes or infection. Orthopedic Exam: Muscle tone and strength are WNL. No limitations in general ROM. No crepitus or effusions noted. Foot type and digits show no abnormalities.  HAV  B/L Skin: No Porokeratosis. No infection or ulcers  Diagnosis:  Onychomycosis, Pain in right toe, pain in left toes  Treatment & Plan Procedures and Treatment: Consent by patient was obtained for treatment procedures. The patient understood the discussion of treatment and procedures well. All questions were answered thoroughly reviewed. Debridement of mycotic and hypertrophic toenails, 1 through 5 bilateral and clearing of subungual debris. No ulceration, no infection noted.  Return Visit-Office Procedure: Patient instructed to return to the office for a follow up visit 3 months for continued evaluation and treatment.

## 2016-05-15 DIAGNOSIS — N186 End stage renal disease: Secondary | ICD-10-CM | POA: Diagnosis not present

## 2016-05-15 DIAGNOSIS — E1129 Type 2 diabetes mellitus with other diabetic kidney complication: Secondary | ICD-10-CM | POA: Diagnosis not present

## 2016-05-15 DIAGNOSIS — D696 Thrombocytopenia, unspecified: Secondary | ICD-10-CM | POA: Diagnosis not present

## 2016-05-15 DIAGNOSIS — D688 Other specified coagulation defects: Secondary | ICD-10-CM | POA: Diagnosis not present

## 2016-05-15 DIAGNOSIS — N2581 Secondary hyperparathyroidism of renal origin: Secondary | ICD-10-CM | POA: Diagnosis not present

## 2016-05-15 DIAGNOSIS — D509 Iron deficiency anemia, unspecified: Secondary | ICD-10-CM | POA: Diagnosis not present

## 2016-05-15 DIAGNOSIS — T8249XA Other complication of vascular dialysis catheter, initial encounter: Secondary | ICD-10-CM | POA: Diagnosis not present

## 2016-05-18 DIAGNOSIS — D688 Other specified coagulation defects: Secondary | ICD-10-CM | POA: Diagnosis not present

## 2016-05-18 DIAGNOSIS — D696 Thrombocytopenia, unspecified: Secondary | ICD-10-CM | POA: Diagnosis not present

## 2016-05-18 DIAGNOSIS — N186 End stage renal disease: Secondary | ICD-10-CM | POA: Diagnosis not present

## 2016-05-18 DIAGNOSIS — D509 Iron deficiency anemia, unspecified: Secondary | ICD-10-CM | POA: Diagnosis not present

## 2016-05-18 DIAGNOSIS — T8249XA Other complication of vascular dialysis catheter, initial encounter: Secondary | ICD-10-CM | POA: Diagnosis not present

## 2016-05-18 DIAGNOSIS — E1129 Type 2 diabetes mellitus with other diabetic kidney complication: Secondary | ICD-10-CM | POA: Diagnosis not present

## 2016-05-18 DIAGNOSIS — N2581 Secondary hyperparathyroidism of renal origin: Secondary | ICD-10-CM | POA: Diagnosis not present

## 2016-05-19 DIAGNOSIS — E1129 Type 2 diabetes mellitus with other diabetic kidney complication: Secondary | ICD-10-CM | POA: Diagnosis not present

## 2016-05-19 DIAGNOSIS — Z992 Dependence on renal dialysis: Secondary | ICD-10-CM | POA: Diagnosis not present

## 2016-05-19 DIAGNOSIS — N186 End stage renal disease: Secondary | ICD-10-CM | POA: Diagnosis not present

## 2016-05-20 DIAGNOSIS — D509 Iron deficiency anemia, unspecified: Secondary | ICD-10-CM | POA: Diagnosis not present

## 2016-05-20 DIAGNOSIS — D631 Anemia in chronic kidney disease: Secondary | ICD-10-CM | POA: Diagnosis not present

## 2016-05-20 DIAGNOSIS — N186 End stage renal disease: Secondary | ICD-10-CM | POA: Diagnosis not present

## 2016-05-20 DIAGNOSIS — N2581 Secondary hyperparathyroidism of renal origin: Secondary | ICD-10-CM | POA: Diagnosis not present

## 2016-05-20 DIAGNOSIS — Z23 Encounter for immunization: Secondary | ICD-10-CM | POA: Diagnosis not present

## 2016-05-20 DIAGNOSIS — E1129 Type 2 diabetes mellitus with other diabetic kidney complication: Secondary | ICD-10-CM | POA: Diagnosis not present

## 2016-05-20 DIAGNOSIS — D688 Other specified coagulation defects: Secondary | ICD-10-CM | POA: Diagnosis not present

## 2016-05-20 DIAGNOSIS — T8249XA Other complication of vascular dialysis catheter, initial encounter: Secondary | ICD-10-CM | POA: Diagnosis not present

## 2016-05-22 DIAGNOSIS — D688 Other specified coagulation defects: Secondary | ICD-10-CM | POA: Diagnosis not present

## 2016-05-22 DIAGNOSIS — N186 End stage renal disease: Secondary | ICD-10-CM | POA: Diagnosis not present

## 2016-05-22 DIAGNOSIS — E1129 Type 2 diabetes mellitus with other diabetic kidney complication: Secondary | ICD-10-CM | POA: Diagnosis not present

## 2016-05-22 DIAGNOSIS — D509 Iron deficiency anemia, unspecified: Secondary | ICD-10-CM | POA: Diagnosis not present

## 2016-05-22 DIAGNOSIS — Z23 Encounter for immunization: Secondary | ICD-10-CM | POA: Diagnosis not present

## 2016-05-22 DIAGNOSIS — N2581 Secondary hyperparathyroidism of renal origin: Secondary | ICD-10-CM | POA: Diagnosis not present

## 2016-05-22 DIAGNOSIS — D631 Anemia in chronic kidney disease: Secondary | ICD-10-CM | POA: Diagnosis not present

## 2016-05-22 DIAGNOSIS — T8249XA Other complication of vascular dialysis catheter, initial encounter: Secondary | ICD-10-CM | POA: Diagnosis not present

## 2016-05-24 ENCOUNTER — Other Ambulatory Visit: Payer: Self-pay | Admitting: Vascular Surgery

## 2016-05-24 DIAGNOSIS — N186 End stage renal disease: Secondary | ICD-10-CM

## 2016-05-25 DIAGNOSIS — D631 Anemia in chronic kidney disease: Secondary | ICD-10-CM | POA: Diagnosis not present

## 2016-05-25 DIAGNOSIS — E1129 Type 2 diabetes mellitus with other diabetic kidney complication: Secondary | ICD-10-CM | POA: Diagnosis not present

## 2016-05-25 DIAGNOSIS — T8249XA Other complication of vascular dialysis catheter, initial encounter: Secondary | ICD-10-CM | POA: Diagnosis not present

## 2016-05-25 DIAGNOSIS — Z23 Encounter for immunization: Secondary | ICD-10-CM | POA: Diagnosis not present

## 2016-05-25 DIAGNOSIS — D509 Iron deficiency anemia, unspecified: Secondary | ICD-10-CM | POA: Diagnosis not present

## 2016-05-25 DIAGNOSIS — N2581 Secondary hyperparathyroidism of renal origin: Secondary | ICD-10-CM | POA: Diagnosis not present

## 2016-05-25 DIAGNOSIS — N186 End stage renal disease: Secondary | ICD-10-CM | POA: Diagnosis not present

## 2016-05-25 DIAGNOSIS — D688 Other specified coagulation defects: Secondary | ICD-10-CM | POA: Diagnosis not present

## 2016-05-27 DIAGNOSIS — N2581 Secondary hyperparathyroidism of renal origin: Secondary | ICD-10-CM | POA: Diagnosis not present

## 2016-05-27 DIAGNOSIS — E1129 Type 2 diabetes mellitus with other diabetic kidney complication: Secondary | ICD-10-CM | POA: Diagnosis not present

## 2016-05-27 DIAGNOSIS — D631 Anemia in chronic kidney disease: Secondary | ICD-10-CM | POA: Diagnosis not present

## 2016-05-27 DIAGNOSIS — Z23 Encounter for immunization: Secondary | ICD-10-CM | POA: Diagnosis not present

## 2016-05-27 DIAGNOSIS — D509 Iron deficiency anemia, unspecified: Secondary | ICD-10-CM | POA: Diagnosis not present

## 2016-05-27 DIAGNOSIS — N186 End stage renal disease: Secondary | ICD-10-CM | POA: Diagnosis not present

## 2016-05-27 DIAGNOSIS — T8249XA Other complication of vascular dialysis catheter, initial encounter: Secondary | ICD-10-CM | POA: Diagnosis not present

## 2016-05-27 DIAGNOSIS — D688 Other specified coagulation defects: Secondary | ICD-10-CM | POA: Diagnosis not present

## 2016-05-29 DIAGNOSIS — E1129 Type 2 diabetes mellitus with other diabetic kidney complication: Secondary | ICD-10-CM | POA: Diagnosis not present

## 2016-05-29 DIAGNOSIS — D688 Other specified coagulation defects: Secondary | ICD-10-CM | POA: Diagnosis not present

## 2016-05-29 DIAGNOSIS — Z23 Encounter for immunization: Secondary | ICD-10-CM | POA: Diagnosis not present

## 2016-05-29 DIAGNOSIS — D631 Anemia in chronic kidney disease: Secondary | ICD-10-CM | POA: Diagnosis not present

## 2016-05-29 DIAGNOSIS — N186 End stage renal disease: Secondary | ICD-10-CM | POA: Diagnosis not present

## 2016-05-29 DIAGNOSIS — D509 Iron deficiency anemia, unspecified: Secondary | ICD-10-CM | POA: Diagnosis not present

## 2016-05-29 DIAGNOSIS — N2581 Secondary hyperparathyroidism of renal origin: Secondary | ICD-10-CM | POA: Diagnosis not present

## 2016-05-29 DIAGNOSIS — T8249XA Other complication of vascular dialysis catheter, initial encounter: Secondary | ICD-10-CM | POA: Diagnosis not present

## 2016-05-31 ENCOUNTER — Ambulatory Visit (INDEPENDENT_AMBULATORY_CARE_PROVIDER_SITE_OTHER): Payer: Medicare Other | Admitting: Surgery

## 2016-05-31 ENCOUNTER — Ambulatory Visit (HOSPITAL_COMMUNITY)
Admission: RE | Admit: 2016-05-31 | Discharge: 2016-05-31 | Disposition: A | Discharge status: Home / Self Care | Payer: Medicare Other | Source: Ambulatory Visit | Attending: Surgery | Admitting: Surgery

## 2016-05-31 ENCOUNTER — Ambulatory Visit (INDEPENDENT_AMBULATORY_CARE_PROVIDER_SITE_OTHER)
Admission: RE | Admit: 2016-05-31 | Discharge: 2016-05-31 | Disposition: A | Discharge status: Home / Self Care | Payer: Medicare Other | Source: Ambulatory Visit | Attending: Vascular Surgery | Admitting: Vascular Surgery

## 2016-05-31 ENCOUNTER — Encounter: Payer: Self-pay | Admitting: Surgery

## 2016-05-31 ENCOUNTER — Other Ambulatory Visit: Payer: Self-pay

## 2016-05-31 VITALS — BP 159/72 | HR 65 | Temp 97.9°F | Resp 18 | Ht 62.0 in | Wt 224.0 lb

## 2016-05-31 DIAGNOSIS — Z992 Dependence on renal dialysis: Secondary | ICD-10-CM

## 2016-05-31 DIAGNOSIS — N186 End stage renal disease: Secondary | ICD-10-CM

## 2016-05-31 NOTE — Progress Notes (Signed)
Vascular and Vein Specialist of [facility]  Patient name: Rachel Hobbs MRN: [id number] DOB: 04/27/1940 Sex: female   REASON FOR VISIT:    F/u ESRD  HISOTRY OF PRESENT ILLNESS:    Rachel Hobbs is a 76 y.o. female status post left brachiocephalic 6 creation by Dr. Oneida Alar on 12/05/2015.  She recently underwent a fistulogram where the arterial venous anastomosis was dilated as well as a part of the cephalic vein in the upper arm.  She did not have any central venous stenosis.  The fistula has occluded.  She is now on dialysis via a catheter on Tuesday Thursday Saturday.  She is right-handed.   PAST MEDICAL HISTORY:   Past Medical History:  Diagnosis Date  . Arthritis   . CHF (congestive heart failure) ([address])   . Depression   . Diabetes mellitus    type 2  . Hernia   . Hyperlipidemia   . Hypertension   . Low iron   . Peripheral vascular disease (HCC)    in legs  . Renal disorder    renal insufficiency  . Shortness of breath dyspnea    with exertion     FAMILY HISTORY:   Family History  Problem Relation Age of Onset  . Kidney disease Mother   . Hypertension Mother   . COPD Father     smoke  . Cancer Father     Lung  . Hypertension Father   . Cancer Brother     prostate  . Kidney disease Sister     SOCIAL HISTORY:   Social History  Substance Use Topics  . Smoking status: Never Smoker  . Smokeless tobacco: Never Used  . Alcohol use No     ALLERGIES:   Allergies  Allergen Reactions  . No Known Allergies      CURRENT MEDICATIONS:   Current Outpatient Prescriptions  Medication Sig Dispense Refill  . acetaminophen (TYLENOL) 500 MG tablet Take 1,000 mg by mouth every 6 (six) hours as needed (pain).     Marland Kitchen amLODipine (NORVASC) 10 MG tablet Take 1 tablet (10 mg total) by mouth daily. 90 tablet 3  . aspirin 81 MG tablet Take 81 mg by mouth daily.      Marland Kitchen atorvastatin (LIPITOR) 40 MG tablet TAKE 1 TABLET BY MOUTH EVERY  DAY FOR CHOLESTEROL 90 tablet 0  . Blood Glucose Monitoring Suppl (BLOOD GLUCOSE METER) kit Use as instructed 1 each 0  . calcitRIOL (ROCALTROL) 0.25 MCG capsule Take 0.25 mcg by mouth daily. Take on Monday, Wednesday and Friday     . calcium carbonate (TUMS - DOSED IN MG ELEMENTAL CALCIUM) 500 MG chewable tablet Chew 1 tablet (200 mg of elemental calcium total) by mouth 3 (three) times daily with meals. (Patient taking differently: Chew 1 tablet by mouth 3 (three) times daily as needed for indigestion or heartburn. ) 90 tablet 1  . carvedilol (COREG) 12.5 MG tablet Take 12.5 mg by mouth 2 (two) times daily with a meal.    . furosemide (LASIX) 40 MG tablet TAKE 3 TABLETS BY MOUTH IN THE MORNING AND 2 TABLETS IN THE EVENING (Patient taking differently: TAKE 2 TABLETS BY MOUTH IN THE MORNING AND 2 TABLETS IN THE EVENING) 180 tablet 0  . hydrALAZINE (APRESOLINE) 50 MG tablet Take 1.5 tablets (75 mg total) by mouth 3 (three) times daily. (Patient taking differently: Take 50 mg by mouth 3 (three) times daily. ) 135 tablet 6  . Insulin Pen Needle 31G X  8 MM MISC BD UltraFine III Pen Needles. For use with insulin pen device. Inject insulin 6 x daily 100 each 3  . isosorbide mononitrate (IMDUR) 30 MG 24 hr tablet Take 30 mg by mouth daily.    . Lancets (ONETOUCH ULTRASOFT) lancets Once daily testing plus prn for hypoglycemia 100 each 9  . LANTUS SOLOSTAR 100 UNIT/ML Solostar Pen INJECT 30 UNITS INTO THE SKIN EVERY MORNING (Patient taking differently: INJECT 30 UNITS INTO THE SKIN EVERY NIGHT AFTER SUPPER.) 15 mL 5  . metolazone (ZAROXOLYN) 2.5 MG tablet Take 2.5 mg by mouth every Monday, Wednesday, and Friday.  1  . Multiple Vitamin (MULTIVITAMIN WITH MINERALS) TABS tablet Take 1 tablet by mouth daily.    Marland Kitchen NEEDLE, DISP, 30 G (B-D DISP NEEDLE 30GX1") 30G X 1" MISC 1 each by Does not apply route daily. 100 each 2  . ONE TOUCH ULTRA TEST test strip TEST BLOOD SUGAR EVERY DAY AND AS NEEDED FOR SYMPTOMS OF  HYPOGLYCEMIA 100 each 3  . oxyCODONE-acetaminophen (ROXICET) 5-325 MG tablet Take 1 tablet by mouth every 6 (six) hours as needed. 6 tablet 0   No current facility-administered medications for this visit.     REVIEW OF SYSTEMS:   _0  denotes positive finding, _1  denotes negative finding Cardiac  Comments:  Chest pain or chest pressure:    Shortness of breath upon exertion:    Short of breath when lying flat:    Irregular heart rhythm:        Vascular    Pain in calf, thigh, or hip brought on by ambulation:    Pain in feet at night that wakes you up from your sleep:     Blood clot in your veins:    Leg swelling:         Pulmonary    Oxygen at home:    Productive cough:     Wheezing:         Neurologic    Sudden weakness in arms or legs:     Sudden numbness in arms or legs:     Sudden onset of difficulty speaking or slurred speech:    Temporary loss of vision in one eye:     Problems with dizziness:         Gastrointestinal    Blood in stool:     Vomited blood:         Genitourinary    Burning when urinating:     Blood in urine:        Psychiatric    Major depression:         Hematologic    Bleeding problems:    Problems with blood clotting too easily:        Skin    Rashes or ulcers:        Constitutional    Fever or chills:      PHYSICAL EXAM:   Vitals:   05/31/16 0849 05/31/16 0850  BP: (!) 156/70 (!) 159/72  Pulse: 65   Resp: 18   Temp: 97.9 F (36.6 C)   TempSrc: Oral   SpO2: 99%   Weight: 224 lb (101.6 kg)   Height: _2  (1.575 m)     GENERAL: The patient is a well-nourished female, in no acute distress. The vital signs are documented above. CARDIAC: There is a regular rate and rhythm.  VASCULAR: Occluded left brachiocephalic fistula.  Palpable left radial pulse and brachial pulse PULMONARY: Non-labored respirations MUSCULOSKELETAL: There are no  major deformities or cyanosis. NEUROLOGIC: No focal weakness or paresthesias are  detected. SKIN: There are no ulcers or rashes noted. PSYCHIATRIC: The patient has a normal affect.  STUDIES:   Vascular lab studies were ordered and reviewed.  She has adequate left basilic vein with diameter measurements between 0.34-0.40.  I have also reviewed the images for her most recent fistulogram.  There is no central venous stenosis  MEDICAL ISSUES:   I discussed proceeding with a stage left basilic vein procedure.  I will try to coordinate her operation for this week depending on scheduling.  She would like for either myself or Dr. early to do her operation.  I discussed that she will likely need a second stage procedure to elevate the fistula.  She understands the risk of non-maturity and steal.  We will contact her once we determine a date for her operation.    Annamarie Major, MD [facility] Specialists of [facility] [phone] Pager [phone]

## 2016-06-01 DIAGNOSIS — T8249XA Other complication of vascular dialysis catheter, initial encounter: Secondary | ICD-10-CM | POA: Diagnosis not present

## 2016-06-01 DIAGNOSIS — D688 Other specified coagulation defects: Secondary | ICD-10-CM | POA: Diagnosis not present

## 2016-06-01 DIAGNOSIS — D631 Anemia in chronic kidney disease: Secondary | ICD-10-CM | POA: Diagnosis not present

## 2016-06-01 DIAGNOSIS — E1129 Type 2 diabetes mellitus with other diabetic kidney complication: Secondary | ICD-10-CM | POA: Diagnosis not present

## 2016-06-01 DIAGNOSIS — N186 End stage renal disease: Secondary | ICD-10-CM | POA: Diagnosis not present

## 2016-06-01 DIAGNOSIS — Z23 Encounter for immunization: Secondary | ICD-10-CM | POA: Diagnosis not present

## 2016-06-01 DIAGNOSIS — N2581 Secondary hyperparathyroidism of renal origin: Secondary | ICD-10-CM | POA: Diagnosis not present

## 2016-06-01 DIAGNOSIS — D509 Iron deficiency anemia, unspecified: Secondary | ICD-10-CM | POA: Diagnosis not present

## 2016-06-02 ENCOUNTER — Encounter: Payer: Self-pay | Admitting: Nephrology

## 2016-06-02 ENCOUNTER — Encounter (HOSPITAL_COMMUNITY): Payer: Self-pay | Admitting: *Deleted

## 2016-06-02 NOTE — Progress Notes (Signed)
Spoke with pt for pre-op call. Pt denies cardiac history, chest pain or sob. Pt is type 2 diabetic. Last A1C was 6.something per pt. She thinks it was done in the last 3 months. States she doesn't check a fasting blood sugar at home. Instructed pt to take 1/2 of her regular dose of Lantus Insulin Thursday PM (will take 15 units). Instructed pt to check her blood sugar Friday AM when she gets up and every 2 hours until she leaves for the hospital. If blood sugar is 70 or below, treat with 1/2 cup of clear juice (apple or cranberry) and recheck blood sugar 15 minutes after drinking juice. If blood sugar continues to be 70 or below, call the Short Stay department and ask to speak to a nurse. Pt voiced understanding.

## 2016-06-03 DIAGNOSIS — T8249XA Other complication of vascular dialysis catheter, initial encounter: Secondary | ICD-10-CM | POA: Diagnosis not present

## 2016-06-03 DIAGNOSIS — Z23 Encounter for immunization: Secondary | ICD-10-CM | POA: Diagnosis not present

## 2016-06-03 DIAGNOSIS — D688 Other specified coagulation defects: Secondary | ICD-10-CM | POA: Diagnosis not present

## 2016-06-03 DIAGNOSIS — D631 Anemia in chronic kidney disease: Secondary | ICD-10-CM | POA: Diagnosis not present

## 2016-06-03 DIAGNOSIS — N2581 Secondary hyperparathyroidism of renal origin: Secondary | ICD-10-CM | POA: Diagnosis not present

## 2016-06-03 DIAGNOSIS — N186 End stage renal disease: Secondary | ICD-10-CM | POA: Diagnosis not present

## 2016-06-03 DIAGNOSIS — D509 Iron deficiency anemia, unspecified: Secondary | ICD-10-CM | POA: Diagnosis not present

## 2016-06-03 DIAGNOSIS — E1129 Type 2 diabetes mellitus with other diabetic kidney complication: Secondary | ICD-10-CM | POA: Diagnosis not present

## 2016-06-04 ENCOUNTER — Ambulatory Visit (HOSPITAL_COMMUNITY): Payer: Medicare Other | Admitting: Anesthesiology

## 2016-06-04 ENCOUNTER — Encounter (HOSPITAL_COMMUNITY)
Admission: RE | Disposition: A | Discharge status: Home / Self Care | Payer: Self-pay | Source: Ambulatory Visit | Attending: Surgery

## 2016-06-04 ENCOUNTER — Encounter (HOSPITAL_COMMUNITY): Payer: Self-pay | Admitting: *Deleted

## 2016-06-04 ENCOUNTER — Ambulatory Visit (HOSPITAL_COMMUNITY)
Admission: RE | Admit: 2016-06-04 | Discharge: 2016-06-04 | Disposition: A | Discharge status: Home / Self Care | Payer: Medicare Other | Source: Ambulatory Visit | Attending: Surgery | Admitting: Surgery

## 2016-06-04 DIAGNOSIS — E119 Type 2 diabetes mellitus without complications: Secondary | ICD-10-CM | POA: Diagnosis not present

## 2016-06-04 DIAGNOSIS — Z794 Long term (current) use of insulin: Secondary | ICD-10-CM | POA: Diagnosis not present

## 2016-06-04 DIAGNOSIS — Z7982 Long term (current) use of aspirin: Secondary | ICD-10-CM | POA: Diagnosis not present

## 2016-06-04 DIAGNOSIS — Z6841 Body Mass Index (BMI) 40.0 and over, adult: Secondary | ICD-10-CM | POA: Diagnosis not present

## 2016-06-04 DIAGNOSIS — I132 Hypertensive heart and chronic kidney disease with heart failure and with stage 5 chronic kidney disease, or end stage renal disease: Secondary | ICD-10-CM | POA: Diagnosis not present

## 2016-06-04 DIAGNOSIS — N186 End stage renal disease: Secondary | ICD-10-CM | POA: Insufficient documentation

## 2016-06-04 DIAGNOSIS — E785 Hyperlipidemia, unspecified: Secondary | ICD-10-CM | POA: Diagnosis not present

## 2016-06-04 DIAGNOSIS — E1122 Type 2 diabetes mellitus with diabetic chronic kidney disease: Secondary | ICD-10-CM | POA: Insufficient documentation

## 2016-06-04 DIAGNOSIS — I509 Heart failure, unspecified: Secondary | ICD-10-CM | POA: Insufficient documentation

## 2016-06-04 DIAGNOSIS — Z992 Dependence on renal dialysis: Secondary | ICD-10-CM | POA: Diagnosis not present

## 2016-06-04 DIAGNOSIS — E1151 Type 2 diabetes mellitus with diabetic peripheral angiopathy without gangrene: Secondary | ICD-10-CM | POA: Insufficient documentation

## 2016-06-04 DIAGNOSIS — Z79899 Other long term (current) drug therapy: Secondary | ICD-10-CM | POA: Insufficient documentation

## 2016-06-04 DIAGNOSIS — I120 Hypertensive chronic kidney disease with stage 5 chronic kidney disease or end stage renal disease: Secondary | ICD-10-CM | POA: Diagnosis not present

## 2016-06-04 DIAGNOSIS — R0602 Shortness of breath: Secondary | ICD-10-CM | POA: Diagnosis not present

## 2016-06-04 DIAGNOSIS — N185 Chronic kidney disease, stage 5: Secondary | ICD-10-CM | POA: Diagnosis not present

## 2016-06-04 HISTORY — PX: BASCILIC VEIN TRANSPOSITION: SHX5742

## 2016-06-04 HISTORY — DX: Iron deficiency anemia, unspecified: D50.9

## 2016-06-04 LAB — POCT I-STAT 4, (NA,K, GLUC, HGB,HCT)
Glucose, Bld: 95 mg/dL (ref 65–99)
HCT: 33 % — ABNORMAL LOW (ref 36.0–46.0)
Hemoglobin: 11.2 g/dL — ABNORMAL LOW (ref 12.0–15.0)
Potassium: 3.8 mmol/L (ref 3.5–5.1)
Sodium: 139 mmol/L (ref 135–145)

## 2016-06-04 LAB — GLUCOSE, CAPILLARY
GLUCOSE-CAPILLARY: 97 mg/dL (ref 65–99)
Glucose-Capillary: 75 mg/dL (ref 65–99)

## 2016-06-04 SURGERY — TRANSPOSITION, VEIN, BASILIC
Anesthesia: Monitor Anesthesia Care | Site: Arm Upper | Laterality: Left

## 2016-06-04 MED ORDER — OXYCODONE-ACETAMINOPHEN 5-325 MG PO TABS: 1.0000 | ORAL_TABLET | Freq: Four times a day (QID) | ORAL | 0 refills | Status: DC | PRN

## 2016-06-04 MED ORDER — LIDOCAINE-EPINEPHRINE (PF) 1 %-1:200000 IJ SOLN
INTRAMUSCULAR | Status: DC | PRN
  Administered 2016-06-04: 8 mL

## 2016-06-04 MED ORDER — PROTAMINE SULFATE 10 MG/ML IV SOLN
INTRAVENOUS | Status: AC
  Filled 2016-06-04: qty 5

## 2016-06-04 MED ORDER — PROTAMINE SULFATE 10 MG/ML IV SOLN
INTRAVENOUS | Status: DC | PRN
  Administered 2016-06-04: 25 mg via INTRAVENOUS

## 2016-06-04 MED ORDER — DEXTROSE 5 % IV SOLN
1.5000 g | INTRAVENOUS | Status: AC
  Administered 2016-06-04: 1.5 g via INTRAVENOUS
  Filled 2016-06-04: qty 1.5

## 2016-06-04 MED ORDER — CHLORHEXIDINE GLUCONATE CLOTH 2 % EX PADS: 6.0000 | MEDICATED_PAD | Freq: Once | CUTANEOUS | Status: DC

## 2016-06-04 MED ORDER — SODIUM CHLORIDE 0.9 % IV SOLN
INTRAVENOUS | Status: DC
  Administered 2016-06-04 (×3): via INTRAVENOUS

## 2016-06-04 MED ORDER — PROPOFOL 10 MG/ML IV BOLUS
INTRAVENOUS | Status: AC
  Filled 2016-06-04: qty 20

## 2016-06-04 MED ORDER — SODIUM CHLORIDE 0.9 % IV SOLN
INTRAVENOUS | Status: DC | PRN
  Administered 2016-06-04: 500 mL

## 2016-06-04 MED ORDER — FENTANYL CITRATE (PF) 250 MCG/5ML IJ SOLN
INTRAMUSCULAR | Status: AC
  Filled 2016-06-04: qty 5

## 2016-06-04 MED ORDER — LIDOCAINE-EPINEPHRINE (PF) 1 %-1:200000 IJ SOLN
INTRAMUSCULAR | Status: AC
  Filled 2016-06-04: qty 30

## 2016-06-04 MED ORDER — ONDANSETRON HCL 4 MG/2ML IJ SOLN
INTRAMUSCULAR | Status: AC
  Filled 2016-06-04: qty 2

## 2016-06-04 MED ORDER — INSULIN GLARGINE 100 UNIT/ML SOLOSTAR PEN: PEN_INJECTOR | SUBCUTANEOUS | Status: DC

## 2016-06-04 MED ORDER — HEMOSTATIC AGENTS (NO CHARGE) OPTIME
TOPICAL | Status: DC | PRN
  Administered 2016-06-04: 1 via TOPICAL

## 2016-06-04 MED ORDER — ONDANSETRON HCL 4 MG/2ML IJ SOLN
INTRAMUSCULAR | Status: DC | PRN
  Administered 2016-06-04: 4 mg via INTRAVENOUS

## 2016-06-04 MED ORDER — HEPARIN SODIUM (PORCINE) 1000 UNIT/ML IJ SOLN
INTRAMUSCULAR | Status: DC | PRN
  Administered 2016-06-04: 3000 [IU] via INTRAVENOUS

## 2016-06-04 MED ORDER — PROPOFOL 500 MG/50ML IV EMUL
INTRAVENOUS | Status: DC | PRN
  Administered 2016-06-04: 50 ug/kg/min via INTRAVENOUS

## 2016-06-04 MED ORDER — 0.9 % SODIUM CHLORIDE (POUR BTL) OPTIME
TOPICAL | Status: DC | PRN
  Administered 2016-06-04: 1000 mL

## 2016-06-04 MED ORDER — HEPARIN SODIUM (PORCINE) 1000 UNIT/ML IJ SOLN
INTRAMUSCULAR | Status: AC
  Filled 2016-06-04: qty 1

## 2016-06-04 SURGICAL SUPPLY — 40 items
ARMBAND PINK RESTRICT EXTREMIT (MISCELLANEOUS) ×3 IMPLANT
CANISTER SUCTION 2500CC (MISCELLANEOUS) ×3 IMPLANT
CLIP TI MEDIUM 24 (CLIP) IMPLANT
CLIP TI MEDIUM 6 (CLIP) IMPLANT
CLIP TI WIDE RED SMALL 24 (CLIP) IMPLANT
CLIP TI WIDE RED SMALL 6 (CLIP) IMPLANT
COVER PROBE W GEL 5X96 (DRAPES) ×3 IMPLANT
DERMABOND ADVANCED (GAUZE/BANDAGES/DRESSINGS) ×2
DERMABOND ADVANCED .7 DNX12 (GAUZE/BANDAGES/DRESSINGS) ×1 IMPLANT
ELECT REM PT RETURN 9FT ADLT (ELECTROSURGICAL) ×3
ELECTRODE REM PT RTRN 9FT ADLT (ELECTROSURGICAL) ×1 IMPLANT
GLOVE BIOGEL PI IND STRL 6.5 (GLOVE) ×2 IMPLANT
GLOVE BIOGEL PI IND STRL 7.0 (GLOVE) ×2 IMPLANT
GLOVE BIOGEL PI IND STRL 7.5 (GLOVE) ×1 IMPLANT
GLOVE BIOGEL PI INDICATOR 6.5 (GLOVE) ×4
GLOVE BIOGEL PI INDICATOR 7.0 (GLOVE) ×4
GLOVE BIOGEL PI INDICATOR 7.5 (GLOVE) ×2
GLOVE ECLIPSE 6.5 STRL STRAW (GLOVE) ×3 IMPLANT
GLOVE SURG SS PI 6.0 STRL IVOR (GLOVE) ×3 IMPLANT
GLOVE SURG SS PI 6.5 STRL IVOR (GLOVE) ×6 IMPLANT
GLOVE SURG SS PI 7.5 STRL IVOR (GLOVE) ×3 IMPLANT
GOWN STRL REUS W/ TWL LRG LVL3 (GOWN DISPOSABLE) ×2 IMPLANT
GOWN STRL REUS W/ TWL XL LVL3 (GOWN DISPOSABLE) ×1 IMPLANT
GOWN STRL REUS W/TWL LRG LVL3 (GOWN DISPOSABLE) ×4
GOWN STRL REUS W/TWL XL LVL3 (GOWN DISPOSABLE) ×2
HEMOSTAT SNOW SURGICEL 2X4 (HEMOSTASIS) ×3 IMPLANT
KIT BASIN OR (CUSTOM PROCEDURE TRAY) ×3 IMPLANT
KIT ROOM TURNOVER OR (KITS) ×3 IMPLANT
NS IRRIG 1000ML POUR BTL (IV SOLUTION) ×3 IMPLANT
PACK CV ACCESS (CUSTOM PROCEDURE TRAY) ×3 IMPLANT
PAD ARMBOARD 7.5X6 YLW CONV (MISCELLANEOUS) ×6 IMPLANT
SPONGE LAP 18X18 X RAY DECT (DISPOSABLE) ×3 IMPLANT
SUT PROLENE 6 0 BV (SUTURE) ×3 IMPLANT
SUT PROLENE 6 0 CC (SUTURE) ×3 IMPLANT
SUT SILK 2 0 SH (SUTURE) IMPLANT
SUT VIC AB 3-0 SH 27 (SUTURE) ×2
SUT VIC AB 3-0 SH 27X BRD (SUTURE) ×1 IMPLANT
SUT VICRYL 4-0 PS2 18IN ABS (SUTURE) ×3 IMPLANT
UNDERPAD 30X30 (UNDERPADS AND DIAPERS) ×3 IMPLANT
WATER STERILE IRR 1000ML POUR (IV SOLUTION) ×3 IMPLANT

## 2016-06-04 NOTE — Discharge Instructions (Signed)
° ° °  06/04/2016 SABENA WINNER [id number] 10-Mar-1941  Surgeon(s): [name], MD  Procedure(s): FIRST STAGE BASILIC VEIN TRANSPOSITION  x Do not stick fistula for 12 weeks

## 2016-06-04 NOTE — H&P (View-Only) (Signed)
Vascular and Vein Specialist of [facility]  Patient name: Rachel Hobbs MRN: [id number] DOB: 04/27/1940 Sex: female   REASON FOR VISIT:    F/u ESRD  HISOTRY OF PRESENT ILLNESS:    Rachel Hobbs is a 76 y.o. female status post left brachiocephalic 6 creation by Dr. Oneida Alar on 12/05/2015.  She recently underwent a fistulogram where the arterial venous anastomosis was dilated as well as a part of the cephalic vein in the upper arm.  She did not have any central venous stenosis.  The fistula has occluded.  She is now on dialysis via a catheter on Tuesday Thursday Saturday.  She is right-handed.   PAST MEDICAL HISTORY:   Past Medical History:  Diagnosis Date  . Arthritis   . CHF (congestive heart failure) ([address])   . Depression   . Diabetes mellitus    type 2  . Hernia   . Hyperlipidemia   . Hypertension   . Low iron   . Peripheral vascular disease (HCC)    in legs  . Renal disorder    renal insufficiency  . Shortness of breath dyspnea    with exertion     FAMILY HISTORY:   Family History  Problem Relation Age of Onset  . Kidney disease Mother   . Hypertension Mother   . COPD Father     smoke  . Cancer Father     Lung  . Hypertension Father   . Cancer Brother     prostate  . Kidney disease Sister     SOCIAL HISTORY:   Social History  Substance Use Topics  . Smoking status: Never Smoker  . Smokeless tobacco: Never Used  . Alcohol use No     ALLERGIES:   Allergies  Allergen Reactions  . No Known Allergies      CURRENT MEDICATIONS:   Current Outpatient Prescriptions  Medication Sig Dispense Refill  . acetaminophen (TYLENOL) 500 MG tablet Take 1,000 mg by mouth every 6 (six) hours as needed (pain).     Marland Kitchen amLODipine (NORVASC) 10 MG tablet Take 1 tablet (10 mg total) by mouth daily. 90 tablet 3  . aspirin 81 MG tablet Take 81 mg by mouth daily.      Marland Kitchen atorvastatin (LIPITOR) 40 MG tablet TAKE 1 TABLET BY MOUTH EVERY  DAY FOR CHOLESTEROL 90 tablet 0  . Blood Glucose Monitoring Suppl (BLOOD GLUCOSE METER) kit Use as instructed 1 each 0  . calcitRIOL (ROCALTROL) 0.25 MCG capsule Take 0.25 mcg by mouth daily. Take on Monday, Wednesday and Friday     . calcium carbonate (TUMS - DOSED IN MG ELEMENTAL CALCIUM) 500 MG chewable tablet Chew 1 tablet (200 mg of elemental calcium total) by mouth 3 (three) times daily with meals. (Patient taking differently: Chew 1 tablet by mouth 3 (three) times daily as needed for indigestion or heartburn. ) 90 tablet 1  . carvedilol (COREG) 12.5 MG tablet Take 12.5 mg by mouth 2 (two) times daily with a meal.    . furosemide (LASIX) 40 MG tablet TAKE 3 TABLETS BY MOUTH IN THE MORNING AND 2 TABLETS IN THE EVENING (Patient taking differently: TAKE 2 TABLETS BY MOUTH IN THE MORNING AND 2 TABLETS IN THE EVENING) 180 tablet 0  . hydrALAZINE (APRESOLINE) 50 MG tablet Take 1.5 tablets (75 mg total) by mouth 3 (three) times daily. (Patient taking differently: Take 50 mg by mouth 3 (three) times daily. ) 135 tablet 6  . Insulin Pen Needle 31G X  8 MM MISC BD UltraFine III Pen Needles. For use with insulin pen device. Inject insulin 6 x daily 100 each 3  . isosorbide mononitrate (IMDUR) 30 MG 24 hr tablet Take 30 mg by mouth daily.    . Lancets (ONETOUCH ULTRASOFT) lancets Once daily testing plus prn for hypoglycemia 100 each 9  . LANTUS SOLOSTAR 100 UNIT/ML Solostar Pen INJECT 30 UNITS INTO THE SKIN EVERY MORNING (Patient taking differently: INJECT 30 UNITS INTO THE SKIN EVERY NIGHT AFTER SUPPER.) 15 mL 5  . metolazone (ZAROXOLYN) 2.5 MG tablet Take 2.5 mg by mouth every Monday, Wednesday, and Friday.  1  . Multiple Vitamin (MULTIVITAMIN WITH MINERALS) TABS tablet Take 1 tablet by mouth daily.    Marland Kitchen NEEDLE, DISP, 30 G (B-D DISP NEEDLE 30GX1") 30G X 1" MISC 1 each by Does not apply route daily. 100 each 2  . ONE TOUCH ULTRA TEST test strip TEST BLOOD SUGAR EVERY DAY AND AS NEEDED FOR SYMPTOMS OF  HYPOGLYCEMIA 100 each 3  . oxyCODONE-acetaminophen (ROXICET) 5-325 MG tablet Take 1 tablet by mouth every 6 (six) hours as needed. 6 tablet 0   No current facility-administered medications for this visit.     REVIEW OF SYSTEMS:   _0  denotes positive finding, _1  denotes negative finding Cardiac  Comments:  Chest pain or chest pressure:    Shortness of breath upon exertion:    Short of breath when lying flat:    Irregular heart rhythm:        Vascular    Pain in calf, thigh, or hip brought on by ambulation:    Pain in feet at night that wakes you up from your sleep:     Blood clot in your veins:    Leg swelling:         Pulmonary    Oxygen at home:    Productive cough:     Wheezing:         Neurologic    Sudden weakness in arms or legs:     Sudden numbness in arms or legs:     Sudden onset of difficulty speaking or slurred speech:    Temporary loss of vision in one eye:     Problems with dizziness:         Gastrointestinal    Blood in stool:     Vomited blood:         Genitourinary    Burning when urinating:     Blood in urine:        Psychiatric    Major depression:         Hematologic    Bleeding problems:    Problems with blood clotting too easily:        Skin    Rashes or ulcers:        Constitutional    Fever or chills:      PHYSICAL EXAM:   Vitals:   05/31/16 0849 05/31/16 0850  BP: (!) 156/70 (!) 159/72  Pulse: 65   Resp: 18   Temp: 97.9 F (36.6 C)   TempSrc: Oral   SpO2: 99%   Weight: 224 lb (101.6 kg)   Height: _2  (1.575 m)     GENERAL: The patient is a well-nourished female, in no acute distress. The vital signs are documented above. CARDIAC: There is a regular rate and rhythm.  VASCULAR: Occluded left brachiocephalic fistula.  Palpable left radial pulse and brachial pulse PULMONARY: Non-labored respirations MUSCULOSKELETAL: There are no  major deformities or cyanosis. NEUROLOGIC: No focal weakness or paresthesias are  detected. SKIN: There are no ulcers or rashes noted. PSYCHIATRIC: The patient has a normal affect.  STUDIES:   Vascular lab studies were ordered and reviewed.  She has adequate left basilic vein with diameter measurements between 0.34-0.40.  I have also reviewed the images for her most recent fistulogram.  There is no central venous stenosis  MEDICAL ISSUES:   I discussed proceeding with a stage left basilic vein procedure.  I will try to coordinate her operation for this week depending on scheduling.  She would like for either myself or Dr. early to do her operation.  I discussed that she will likely need a second stage procedure to elevate the fistula.  She understands the risk of non-maturity and steal.  We will contact her once we determine a date for her operation.    Annamarie Major, MD [facility] Specialists of [facility] [phone] Pager [phone]

## 2016-06-04 NOTE — Interval H&P Note (Signed)
History and Physical Interval Note:  06/04/2016 1:49 PM  Rachel Hobbs  has presented today for surgery, with the diagnosis of End Stage Renal Disease N18.6  The various methods of treatment have been discussed with the patient and family. After consideration of risks, benefits and other options for treatment, the patient has consented to  Procedure(s): FIRST STAGE BASILIC VEIN TRANSPOSITION (Left) as a surgical intervention .  The patient's history has been reviewed, patient examined, no change in status, stable for surgery.  I have reviewed the patient's chart and labs.  Questions were answered to the patient's satisfaction.     Annamarie Major

## 2016-06-04 NOTE — Anesthesia Postprocedure Evaluation (Addendum)
Anesthesia Post Note  Patient: Rachel Hobbs  Procedure(s) Performed: Procedure(s) (LRB): FIRST STAGE BASILIC VEIN TRANSPOSITION (Left)  Patient location during evaluation: PACU Anesthesia Type: MAC Level of consciousness: awake and alert Pain management: pain level controlled Vital Signs Assessment: post-procedure vital signs reviewed and stable Respiratory status: spontaneous breathing, nonlabored ventilation, respiratory function stable and patient connected to nasal cannula oxygen Cardiovascular status: stable and blood pressure returned to baseline Anesthetic complications: no       Last Vitals:  Vitals:   06/04/16 1645 06/04/16 1658  BP: (!) 134/51 (!) 133/56  Pulse: 62   Resp: 11   Temp:  36.4 C    Last Pain:  Vitals:   06/04/16 1045  TempSrc: Oral                 Effie Berkshire

## 2016-06-04 NOTE — Op Note (Signed)
    Patient name: Rachel Hobbs MRN: [id number] DOB: Apr 24, 1940 Sex: female  06/04/2016 Pre-operative Diagnosis: ESRD Post-operative diagnosis:  Same Surgeon:  Annamarie Major Assistants:  S. Rhyne Procedure:   Left 1st stage basilic vein fistula Anesthesia:  MAC Blood Loss:  See anesthesia record Specimens:  none  Findings:  Healthy appearing 28m basilic vein  Indications:  She has a failed left BCF.  Mapping suggested an adequate basilic vein  Procedure:  The patient was identified in the holding area and taken to [id number]  The patient was then placed supine on the table. MAC anesthesia was administered.  The patient was prepped and draped in the usual sterile fashion.  A time out was called and antibiotics were administered.  U/S was used to evaluate the basilic vein which was 385mand compressible.  I made an oblique incision above the AC crease.  I initially dissected out the brachial artery which was 64m40mith mild plaque.  Once this was isolated, I dissected out the basilic vein an mobilized it within the incision.  Side branches were divided.  The vein was then marked.  3000 units of heparin was given.  The vein was divided distally.  It distended nicely with heparinized saline.  The artery was occluded, and a longitudinal arteriotomy was made with a #11 blade which was extended with Potts scissors.  The vein was cut to the appropriate length and a running anastamosis was created with 6-0 Prolene.   After the appropriate flushing maneuvers were done, the anastamosis was competed.  There was an excellent thrill in the vein and a brisk redial artery signal.  25 mg of protamine was given.  I inspected the course of the vein to make sure there were no kinks.  Once hemostasis was adequate, the incision was closed with 2 layers of 3-0 vicryl, followed by dermabond   Disposition:  To PACU in stable condition.   V. Theotis Burrow.D. [facility]:  [phone]:  [phone]

## 2016-06-04 NOTE — Anesthesia Preprocedure Evaluation (Addendum)
Anesthesia Evaluation  Patient identified by MRN, date of birth, ID band Patient awake    Reviewed: Allergy & Precautions, NPO status , Patient's Chart, lab work & pertinent test results  Airway Mallampati: II  TM Distance: >3 FB Neck ROM: Full    Dental  (+) Teeth Intact, Dental Advisory Given   Pulmonary shortness of breath,    breath sounds clear to auscultation       Cardiovascular hypertension, + Peripheral Vascular Disease and +CHF   Rhythm:Regular Rate:Normal     Neuro/Psych    GI/Hepatic   Endo/Other  diabetesMorbid obesity  Renal/GU ESRF and DialysisRenal disease     Musculoskeletal  (+) Arthritis ,   Abdominal   Peds  Hematology   Anesthesia Other Findings   Reproductive/Obstetrics                             Anesthesia Physical Anesthesia Plan  ASA: III  Anesthesia Plan: MAC   Post-op Pain Management:    Induction: Intravenous  Airway Management Planned: Natural Airway and Simple Face Mask  Additional Equipment:   Intra-op Plan:   Post-operative Plan:   Informed Consent: I have reviewed the patients History and Physical, chart, labs and discussed the procedure including the risks, benefits and alternatives for the proposed anesthesia with the patient or authorized representative who has indicated his/her understanding and acceptance.   Dental advisory given  Plan Discussed with:   Anesthesia Plan Comments:         Anesthesia Quick Evaluation

## 2016-06-04 NOTE — Transfer of Care (Signed)
Immediate Anesthesia Transfer of Care Note  Patient: Rachel Hobbs  Procedure(s) Performed: Procedure(s): FIRST STAGE BASILIC VEIN TRANSPOSITION (Left)  Patient Location: PACU  Anesthesia Type:MAC  Level of Consciousness: awake, alert  and oriented  Airway & Oxygen Therapy: Patient Spontanous Breathing  Post-op Assessment: Report given to RN, Post -op Vital signs reviewed and stable and Patient moving all extremities X 4  Post vital signs: Reviewed and stable  Last Vitals:  Vitals:   06/04/16 1045  BP: (!) 166/64  Pulse: 67  Resp: 18  Temp: 37 C    Last Pain:  Vitals:   06/04/16 1045  TempSrc: Oral      Patients Stated Pain Goal: 5 (28/41/32 4401)  Complications: No apparent anesthesia complications

## 2016-06-05 ENCOUNTER — Encounter (HOSPITAL_COMMUNITY): Payer: Self-pay | Admitting: Surgery

## 2016-06-05 DIAGNOSIS — D509 Iron deficiency anemia, unspecified: Secondary | ICD-10-CM | POA: Diagnosis not present

## 2016-06-05 DIAGNOSIS — N186 End stage renal disease: Secondary | ICD-10-CM | POA: Diagnosis not present

## 2016-06-05 DIAGNOSIS — Z23 Encounter for immunization: Secondary | ICD-10-CM | POA: Diagnosis not present

## 2016-06-05 DIAGNOSIS — D688 Other specified coagulation defects: Secondary | ICD-10-CM | POA: Diagnosis not present

## 2016-06-05 DIAGNOSIS — N2581 Secondary hyperparathyroidism of renal origin: Secondary | ICD-10-CM | POA: Diagnosis not present

## 2016-06-05 DIAGNOSIS — E1129 Type 2 diabetes mellitus with other diabetic kidney complication: Secondary | ICD-10-CM | POA: Diagnosis not present

## 2016-06-05 DIAGNOSIS — D631 Anemia in chronic kidney disease: Secondary | ICD-10-CM | POA: Diagnosis not present

## 2016-06-05 DIAGNOSIS — T8249XA Other complication of vascular dialysis catheter, initial encounter: Secondary | ICD-10-CM | POA: Diagnosis not present

## 2016-06-07 ENCOUNTER — Telehealth: Payer: Self-pay | Admitting: Surgery

## 2016-06-07 NOTE — Telephone Encounter (Signed)
Scheduled duplex on 3/19 and post op on 3/21. Mailed reminder to patient.

## 2016-06-07 NOTE — Telephone Encounter (Signed)
-----   Message from Mena Goes, RN sent at 06/05/2016 12:39 PM EST ----- Regarding: 6 weeks w/ duplex   ----- Message ----- From: Gabriel Earing, PA-C Sent: 06/04/2016   4:03 PM To: Vvs Charge Pool  S/p left 1st stage BVT.  F/u in 6 weeks with duplex with Dr. Trula Slade for eval for 2nd stage BVT.  Thanks, Aldona Bar

## 2016-06-08 DIAGNOSIS — T8249XA Other complication of vascular dialysis catheter, initial encounter: Secondary | ICD-10-CM | POA: Diagnosis not present

## 2016-06-08 DIAGNOSIS — E1129 Type 2 diabetes mellitus with other diabetic kidney complication: Secondary | ICD-10-CM | POA: Diagnosis not present

## 2016-06-08 DIAGNOSIS — D509 Iron deficiency anemia, unspecified: Secondary | ICD-10-CM | POA: Diagnosis not present

## 2016-06-08 DIAGNOSIS — N2581 Secondary hyperparathyroidism of renal origin: Secondary | ICD-10-CM | POA: Diagnosis not present

## 2016-06-08 DIAGNOSIS — D631 Anemia in chronic kidney disease: Secondary | ICD-10-CM | POA: Diagnosis not present

## 2016-06-08 DIAGNOSIS — D688 Other specified coagulation defects: Secondary | ICD-10-CM | POA: Diagnosis not present

## 2016-06-08 DIAGNOSIS — Z23 Encounter for immunization: Secondary | ICD-10-CM | POA: Diagnosis not present

## 2016-06-08 DIAGNOSIS — N186 End stage renal disease: Secondary | ICD-10-CM | POA: Diagnosis not present

## 2016-06-10 DIAGNOSIS — D631 Anemia in chronic kidney disease: Secondary | ICD-10-CM | POA: Diagnosis not present

## 2016-06-10 DIAGNOSIS — E1129 Type 2 diabetes mellitus with other diabetic kidney complication: Secondary | ICD-10-CM | POA: Diagnosis not present

## 2016-06-10 DIAGNOSIS — N186 End stage renal disease: Secondary | ICD-10-CM | POA: Diagnosis not present

## 2016-06-10 DIAGNOSIS — D509 Iron deficiency anemia, unspecified: Secondary | ICD-10-CM | POA: Diagnosis not present

## 2016-06-10 DIAGNOSIS — D688 Other specified coagulation defects: Secondary | ICD-10-CM | POA: Diagnosis not present

## 2016-06-10 DIAGNOSIS — N2581 Secondary hyperparathyroidism of renal origin: Secondary | ICD-10-CM | POA: Diagnosis not present

## 2016-06-10 DIAGNOSIS — Z23 Encounter for immunization: Secondary | ICD-10-CM | POA: Diagnosis not present

## 2016-06-10 DIAGNOSIS — T8249XA Other complication of vascular dialysis catheter, initial encounter: Secondary | ICD-10-CM | POA: Diagnosis not present

## 2016-06-10 NOTE — Addendum Note (Signed)
Addended by: Lianne Cure A on: 06/10/2016 04:53 PM   Modules accepted: Orders

## 2016-06-12 DIAGNOSIS — D688 Other specified coagulation defects: Secondary | ICD-10-CM | POA: Diagnosis not present

## 2016-06-12 DIAGNOSIS — N186 End stage renal disease: Secondary | ICD-10-CM | POA: Diagnosis not present

## 2016-06-12 DIAGNOSIS — N2581 Secondary hyperparathyroidism of renal origin: Secondary | ICD-10-CM | POA: Diagnosis not present

## 2016-06-12 DIAGNOSIS — D509 Iron deficiency anemia, unspecified: Secondary | ICD-10-CM | POA: Diagnosis not present

## 2016-06-12 DIAGNOSIS — T8249XA Other complication of vascular dialysis catheter, initial encounter: Secondary | ICD-10-CM | POA: Diagnosis not present

## 2016-06-12 DIAGNOSIS — D631 Anemia in chronic kidney disease: Secondary | ICD-10-CM | POA: Diagnosis not present

## 2016-06-12 DIAGNOSIS — Z23 Encounter for immunization: Secondary | ICD-10-CM | POA: Diagnosis not present

## 2016-06-12 DIAGNOSIS — E1129 Type 2 diabetes mellitus with other diabetic kidney complication: Secondary | ICD-10-CM | POA: Diagnosis not present

## 2016-06-15 DIAGNOSIS — D631 Anemia in chronic kidney disease: Secondary | ICD-10-CM | POA: Diagnosis not present

## 2016-06-15 DIAGNOSIS — Z23 Encounter for immunization: Secondary | ICD-10-CM | POA: Diagnosis not present

## 2016-06-15 DIAGNOSIS — E1129 Type 2 diabetes mellitus with other diabetic kidney complication: Secondary | ICD-10-CM | POA: Diagnosis not present

## 2016-06-15 DIAGNOSIS — N186 End stage renal disease: Secondary | ICD-10-CM | POA: Diagnosis not present

## 2016-06-15 DIAGNOSIS — D688 Other specified coagulation defects: Secondary | ICD-10-CM | POA: Diagnosis not present

## 2016-06-15 DIAGNOSIS — T8249XA Other complication of vascular dialysis catheter, initial encounter: Secondary | ICD-10-CM | POA: Diagnosis not present

## 2016-06-15 DIAGNOSIS — D509 Iron deficiency anemia, unspecified: Secondary | ICD-10-CM | POA: Diagnosis not present

## 2016-06-15 DIAGNOSIS — N2581 Secondary hyperparathyroidism of renal origin: Secondary | ICD-10-CM | POA: Diagnosis not present

## 2016-06-16 DIAGNOSIS — E1129 Type 2 diabetes mellitus with other diabetic kidney complication: Secondary | ICD-10-CM | POA: Diagnosis not present

## 2016-06-16 DIAGNOSIS — Z992 Dependence on renal dialysis: Secondary | ICD-10-CM | POA: Diagnosis not present

## 2016-06-16 DIAGNOSIS — N186 End stage renal disease: Secondary | ICD-10-CM | POA: Diagnosis not present

## 2016-06-17 DIAGNOSIS — E1129 Type 2 diabetes mellitus with other diabetic kidney complication: Secondary | ICD-10-CM | POA: Diagnosis not present

## 2016-06-17 DIAGNOSIS — D688 Other specified coagulation defects: Secondary | ICD-10-CM | POA: Diagnosis not present

## 2016-06-17 DIAGNOSIS — N186 End stage renal disease: Secondary | ICD-10-CM | POA: Diagnosis not present

## 2016-06-17 DIAGNOSIS — D509 Iron deficiency anemia, unspecified: Secondary | ICD-10-CM | POA: Diagnosis not present

## 2016-06-17 DIAGNOSIS — N2581 Secondary hyperparathyroidism of renal origin: Secondary | ICD-10-CM | POA: Diagnosis not present

## 2016-06-17 DIAGNOSIS — T8249XA Other complication of vascular dialysis catheter, initial encounter: Secondary | ICD-10-CM | POA: Diagnosis not present

## 2016-06-17 DIAGNOSIS — D631 Anemia in chronic kidney disease: Secondary | ICD-10-CM | POA: Diagnosis not present

## 2016-06-17 DIAGNOSIS — Z23 Encounter for immunization: Secondary | ICD-10-CM | POA: Diagnosis not present

## 2016-06-19 DIAGNOSIS — D631 Anemia in chronic kidney disease: Secondary | ICD-10-CM | POA: Diagnosis not present

## 2016-06-19 DIAGNOSIS — E1129 Type 2 diabetes mellitus with other diabetic kidney complication: Secondary | ICD-10-CM | POA: Diagnosis not present

## 2016-06-19 DIAGNOSIS — D509 Iron deficiency anemia, unspecified: Secondary | ICD-10-CM | POA: Diagnosis not present

## 2016-06-19 DIAGNOSIS — Z23 Encounter for immunization: Secondary | ICD-10-CM | POA: Diagnosis not present

## 2016-06-19 DIAGNOSIS — N186 End stage renal disease: Secondary | ICD-10-CM | POA: Diagnosis not present

## 2016-06-19 DIAGNOSIS — D688 Other specified coagulation defects: Secondary | ICD-10-CM | POA: Diagnosis not present

## 2016-06-19 DIAGNOSIS — N2581 Secondary hyperparathyroidism of renal origin: Secondary | ICD-10-CM | POA: Diagnosis not present

## 2016-06-19 DIAGNOSIS — T8249XA Other complication of vascular dialysis catheter, initial encounter: Secondary | ICD-10-CM | POA: Diagnosis not present

## 2016-06-22 DIAGNOSIS — Z23 Encounter for immunization: Secondary | ICD-10-CM | POA: Diagnosis not present

## 2016-06-22 DIAGNOSIS — E1129 Type 2 diabetes mellitus with other diabetic kidney complication: Secondary | ICD-10-CM | POA: Diagnosis not present

## 2016-06-22 DIAGNOSIS — T8249XA Other complication of vascular dialysis catheter, initial encounter: Secondary | ICD-10-CM | POA: Diagnosis not present

## 2016-06-22 DIAGNOSIS — N2581 Secondary hyperparathyroidism of renal origin: Secondary | ICD-10-CM | POA: Diagnosis not present

## 2016-06-22 DIAGNOSIS — D509 Iron deficiency anemia, unspecified: Secondary | ICD-10-CM | POA: Diagnosis not present

## 2016-06-22 DIAGNOSIS — D688 Other specified coagulation defects: Secondary | ICD-10-CM | POA: Diagnosis not present

## 2016-06-22 DIAGNOSIS — N186 End stage renal disease: Secondary | ICD-10-CM | POA: Diagnosis not present

## 2016-06-22 DIAGNOSIS — D631 Anemia in chronic kidney disease: Secondary | ICD-10-CM | POA: Diagnosis not present

## 2016-06-24 DIAGNOSIS — D509 Iron deficiency anemia, unspecified: Secondary | ICD-10-CM | POA: Diagnosis not present

## 2016-06-24 DIAGNOSIS — T8249XA Other complication of vascular dialysis catheter, initial encounter: Secondary | ICD-10-CM | POA: Diagnosis not present

## 2016-06-24 DIAGNOSIS — E1129 Type 2 diabetes mellitus with other diabetic kidney complication: Secondary | ICD-10-CM | POA: Diagnosis not present

## 2016-06-24 DIAGNOSIS — D631 Anemia in chronic kidney disease: Secondary | ICD-10-CM | POA: Diagnosis not present

## 2016-06-24 DIAGNOSIS — N2581 Secondary hyperparathyroidism of renal origin: Secondary | ICD-10-CM | POA: Diagnosis not present

## 2016-06-24 DIAGNOSIS — Z23 Encounter for immunization: Secondary | ICD-10-CM | POA: Diagnosis not present

## 2016-06-24 DIAGNOSIS — N186 End stage renal disease: Secondary | ICD-10-CM | POA: Diagnosis not present

## 2016-06-24 DIAGNOSIS — D688 Other specified coagulation defects: Secondary | ICD-10-CM | POA: Diagnosis not present

## 2016-06-25 ENCOUNTER — Encounter: Payer: Self-pay | Admitting: Surgery

## 2016-06-26 DIAGNOSIS — D688 Other specified coagulation defects: Secondary | ICD-10-CM | POA: Diagnosis not present

## 2016-06-26 DIAGNOSIS — N186 End stage renal disease: Secondary | ICD-10-CM | POA: Diagnosis not present

## 2016-06-26 DIAGNOSIS — Z23 Encounter for immunization: Secondary | ICD-10-CM | POA: Diagnosis not present

## 2016-06-26 DIAGNOSIS — N2581 Secondary hyperparathyroidism of renal origin: Secondary | ICD-10-CM | POA: Diagnosis not present

## 2016-06-26 DIAGNOSIS — D509 Iron deficiency anemia, unspecified: Secondary | ICD-10-CM | POA: Diagnosis not present

## 2016-06-26 DIAGNOSIS — D631 Anemia in chronic kidney disease: Secondary | ICD-10-CM | POA: Diagnosis not present

## 2016-06-26 DIAGNOSIS — E1129 Type 2 diabetes mellitus with other diabetic kidney complication: Secondary | ICD-10-CM | POA: Diagnosis not present

## 2016-06-26 DIAGNOSIS — T8249XA Other complication of vascular dialysis catheter, initial encounter: Secondary | ICD-10-CM | POA: Diagnosis not present

## 2016-06-29 DIAGNOSIS — N2581 Secondary hyperparathyroidism of renal origin: Secondary | ICD-10-CM | POA: Diagnosis not present

## 2016-06-29 DIAGNOSIS — N186 End stage renal disease: Secondary | ICD-10-CM | POA: Diagnosis not present

## 2016-06-29 DIAGNOSIS — D631 Anemia in chronic kidney disease: Secondary | ICD-10-CM | POA: Diagnosis not present

## 2016-06-29 DIAGNOSIS — T8249XA Other complication of vascular dialysis catheter, initial encounter: Secondary | ICD-10-CM | POA: Diagnosis not present

## 2016-06-29 DIAGNOSIS — D688 Other specified coagulation defects: Secondary | ICD-10-CM | POA: Diagnosis not present

## 2016-06-29 DIAGNOSIS — Z23 Encounter for immunization: Secondary | ICD-10-CM | POA: Diagnosis not present

## 2016-06-29 DIAGNOSIS — D509 Iron deficiency anemia, unspecified: Secondary | ICD-10-CM | POA: Diagnosis not present

## 2016-06-29 DIAGNOSIS — E1129 Type 2 diabetes mellitus with other diabetic kidney complication: Secondary | ICD-10-CM | POA: Diagnosis not present

## 2016-06-30 ENCOUNTER — Encounter (HOSPITAL_COMMUNITY): Payer: Medicare Other

## 2016-06-30 ENCOUNTER — Other Ambulatory Visit (HOSPITAL_COMMUNITY): Payer: Medicare Other

## 2016-06-30 ENCOUNTER — Ambulatory Visit: Payer: Medicare Other | Admitting: Vascular Surgery

## 2016-07-01 DIAGNOSIS — D509 Iron deficiency anemia, unspecified: Secondary | ICD-10-CM | POA: Diagnosis not present

## 2016-07-01 DIAGNOSIS — T8249XA Other complication of vascular dialysis catheter, initial encounter: Secondary | ICD-10-CM | POA: Diagnosis not present

## 2016-07-01 DIAGNOSIS — D631 Anemia in chronic kidney disease: Secondary | ICD-10-CM | POA: Diagnosis not present

## 2016-07-01 DIAGNOSIS — Z23 Encounter for immunization: Secondary | ICD-10-CM | POA: Diagnosis not present

## 2016-07-01 DIAGNOSIS — N186 End stage renal disease: Secondary | ICD-10-CM | POA: Diagnosis not present

## 2016-07-01 DIAGNOSIS — E1129 Type 2 diabetes mellitus with other diabetic kidney complication: Secondary | ICD-10-CM | POA: Diagnosis not present

## 2016-07-01 DIAGNOSIS — D688 Other specified coagulation defects: Secondary | ICD-10-CM | POA: Diagnosis not present

## 2016-07-01 DIAGNOSIS — N2581 Secondary hyperparathyroidism of renal origin: Secondary | ICD-10-CM | POA: Diagnosis not present

## 2016-07-03 DIAGNOSIS — N2581 Secondary hyperparathyroidism of renal origin: Secondary | ICD-10-CM | POA: Diagnosis not present

## 2016-07-03 DIAGNOSIS — D631 Anemia in chronic kidney disease: Secondary | ICD-10-CM | POA: Diagnosis not present

## 2016-07-03 DIAGNOSIS — D688 Other specified coagulation defects: Secondary | ICD-10-CM | POA: Diagnosis not present

## 2016-07-03 DIAGNOSIS — E1129 Type 2 diabetes mellitus with other diabetic kidney complication: Secondary | ICD-10-CM | POA: Diagnosis not present

## 2016-07-03 DIAGNOSIS — N186 End stage renal disease: Secondary | ICD-10-CM | POA: Diagnosis not present

## 2016-07-03 DIAGNOSIS — T8249XA Other complication of vascular dialysis catheter, initial encounter: Secondary | ICD-10-CM | POA: Diagnosis not present

## 2016-07-03 DIAGNOSIS — D509 Iron deficiency anemia, unspecified: Secondary | ICD-10-CM | POA: Diagnosis not present

## 2016-07-03 DIAGNOSIS — Z23 Encounter for immunization: Secondary | ICD-10-CM | POA: Diagnosis not present

## 2016-07-05 ENCOUNTER — Ambulatory Visit (INDEPENDENT_AMBULATORY_CARE_PROVIDER_SITE_OTHER): Payer: Medicare Other

## 2016-07-05 ENCOUNTER — Ambulatory Visit (INDEPENDENT_AMBULATORY_CARE_PROVIDER_SITE_OTHER): Payer: Medicare Other | Admitting: Podiatry

## 2016-07-05 ENCOUNTER — Ambulatory Visit (HOSPITAL_COMMUNITY)
Admission: RE | Admit: 2016-07-05 | Discharge: 2016-07-05 | Disposition: A | Discharge status: Home / Self Care | Payer: Medicare Other | Source: Ambulatory Visit | Attending: Surgery | Admitting: Surgery

## 2016-07-05 DIAGNOSIS — N186 End stage renal disease: Secondary | ICD-10-CM | POA: Diagnosis not present

## 2016-07-05 DIAGNOSIS — M7751 Other enthesopathy of right foot: Secondary | ICD-10-CM

## 2016-07-05 DIAGNOSIS — R52 Pain, unspecified: Secondary | ICD-10-CM

## 2016-07-05 DIAGNOSIS — M79671 Pain in right foot: Secondary | ICD-10-CM | POA: Diagnosis not present

## 2016-07-05 DIAGNOSIS — Z992 Dependence on renal dialysis: Secondary | ICD-10-CM

## 2016-07-05 MED ORDER — BETAMETHASONE SOD PHOS & ACET 6 (3-3) MG/ML IJ SUSP: 3.0000 mg | Freq: Once | INTRAMUSCULAR | Status: DC

## 2016-07-05 NOTE — Progress Notes (Signed)
   Subjective:  Patient with a history of diabetes mellitus and is currently on dialysis presents today for a new complaint of trauma to the fifth digit of the right foot. Patient states that she stubbed her toe proximally week ago and now it is hurting all the way back to her heel. Patient states is painful during ambulation. Patient presents today for further treatment and evaluation.    Objective/Physical Exam General: The patient is alert and oriented x3 in no acute distress.  Dermatology: Skin is warm, dry and supple bilateral lower extremities. Negative for open lesions or macerations.  Vascular: Palpable pedal pulses bilaterally. No edema or erythema noted. Capillary refill within normal limits.  Neurological: Epicritic and protective threshold grossly intact bilaterally.   Musculoskeletal Exam: Pain on palpation and range of motion of the fifth MPJ right foot. Pain on palpation also noted to the lateral aspect of the posterior heel right foot.   Radiographic Exam:  Normal osseous mineralization. Joint spaces preserved. No fracture/dislocation/boney destruction.    Assessment: #1 insertional Achilles tendinitis right - lateral band #2 capsulitis fifth MPJ right foot secondary to trauma   Plan of Care:  #1 Patient was evaluated. X-rays were reviewed negative for fracture. #2 injection of 0.5 mL Celestone Soluspan injected into the lateral band of the right posterior heel #3 postoperative shoe dispensed #4 compression anklet dispensed #5 return to clinic in 4 weeks   Edrick Kins, DPM [facility]  Dr. Edrick Kins, [address]                                        [place], [place] [address]                Office [phone]  Fax [phone]

## 2016-07-06 DIAGNOSIS — D688 Other specified coagulation defects: Secondary | ICD-10-CM | POA: Diagnosis not present

## 2016-07-06 DIAGNOSIS — N186 End stage renal disease: Secondary | ICD-10-CM | POA: Diagnosis not present

## 2016-07-06 DIAGNOSIS — N2581 Secondary hyperparathyroidism of renal origin: Secondary | ICD-10-CM | POA: Diagnosis not present

## 2016-07-06 DIAGNOSIS — T8249XA Other complication of vascular dialysis catheter, initial encounter: Secondary | ICD-10-CM | POA: Diagnosis not present

## 2016-07-06 DIAGNOSIS — D631 Anemia in chronic kidney disease: Secondary | ICD-10-CM | POA: Diagnosis not present

## 2016-07-06 DIAGNOSIS — E1129 Type 2 diabetes mellitus with other diabetic kidney complication: Secondary | ICD-10-CM | POA: Diagnosis not present

## 2016-07-06 DIAGNOSIS — Z23 Encounter for immunization: Secondary | ICD-10-CM | POA: Diagnosis not present

## 2016-07-06 DIAGNOSIS — D509 Iron deficiency anemia, unspecified: Secondary | ICD-10-CM | POA: Diagnosis not present

## 2016-07-07 ENCOUNTER — Encounter: Payer: Self-pay | Admitting: Surgery

## 2016-07-07 ENCOUNTER — Ambulatory Visit (INDEPENDENT_AMBULATORY_CARE_PROVIDER_SITE_OTHER): Payer: Self-pay | Admitting: Surgery

## 2016-07-07 VITALS — BP 130/63 | HR 69 | Temp 98.8°F | Resp 16 | Ht 62.0 in | Wt 225.0 lb

## 2016-07-07 DIAGNOSIS — N186 End stage renal disease: Secondary | ICD-10-CM

## 2016-07-07 DIAGNOSIS — Z992 Dependence on renal dialysis: Secondary | ICD-10-CM

## 2016-07-07 NOTE — Progress Notes (Signed)
Patient name: Rachel Hobbs MRN: [id number] DOB: March 29, 1941 Sex: female  REASON FOR VISIT:     post op fistula  HISTORY OF PRESENT ILLNESS:   Rachel Hobbs is a 76 y.o. female who is status post first stage left basilic vein transposition on 06/04/2016.  She has a history of a failed left brachiocephalic fistula on 71/21/9758.  She has mild complaints of coolness in her left hand  CURRENT MEDICATIONS:    Current Outpatient Prescriptions  Medication Sig Dispense Refill  . acetaminophen (TYLENOL) 500 MG tablet Take 1,000 mg by mouth every 6 (six) hours as needed (pain).     Marland Kitchen amLODipine (NORVASC) 10 MG tablet Take 1 tablet (10 mg total) by mouth daily. 90 tablet 3  . aspirin 81 MG tablet Take 81 mg by mouth daily.      Marland Kitchen atorvastatin (LIPITOR) 40 MG tablet TAKE 1 TABLET BY MOUTH EVERY DAY FOR CHOLESTEROL 90 tablet 0  . Blood Glucose Monitoring Suppl (BLOOD GLUCOSE METER) kit Use as instructed 1 each 0  . calcitRIOL (ROCALTROL) 0.25 MCG capsule Take 0.25 mcg by mouth daily. Take on Monday, Wednesday and Friday     . calcium acetate (PHOSLO) 667 MG capsule Take 1 capsule by mouth See admin instructions. Pt may take 1 capsule with largest meal of the day    . calcium carbonate (TUMS - DOSED IN MG ELEMENTAL CALCIUM) 500 MG chewable tablet Chew 1 tablet (200 mg of elemental calcium total) by mouth 3 (three) times daily with meals. (Patient taking differently: Chew 1 tablet by mouth 3 (three) times daily as needed for indigestion or heartburn. ) 90 tablet 1  . carvedilol (COREG) 12.5 MG tablet Take 6.25 mg by mouth 2 (two) times daily with a meal.     . ethyl chloride spray PLACE 1 SPRAY ONTO THE SKIN 3 TIMES A WEEK UTD  5  . Insulin Glargine (LANTUS SOLOSTAR) 100 UNIT/ML Solostar Pen INJECT 30 UNITS INTO THE SKIN EVERY NIGHT AFTER SUPPER.    . Insulin Pen Needle 31G X 8 MM MISC BD UltraFine III Pen Needles. For use with insulin pen device. Inject insulin 6 x  daily 100 each 3  . isosorbide mononitrate (IMDUR) 30 MG 24 hr tablet Take 30 mg by mouth daily.    . Lancets (ONETOUCH ULTRASOFT) lancets Once daily testing plus prn for hypoglycemia 100 each 9  . Multiple Vitamin (MULTIVITAMIN WITH MINERALS) TABS tablet Take 1 tablet by mouth daily.    Marland Kitchen NEEDLE, DISP, 30 G (B-D DISP NEEDLE 30GX1") 30G X 1" MISC 1 each by Does not apply route daily. 100 each 2  . ONE TOUCH ULTRA TEST test strip TEST BLOOD SUGAR EVERY DAY AND AS NEEDED FOR SYMPTOMS OF HYPOGLYCEMIA 100 each 3  . senna (SENOKOT) 8.6 MG tablet Take 1 tablet by mouth daily as needed for constipation.    . triamcinolone cream (KENALOG) 0.1 % Apply 1 application topically 2 (two) times daily as needed.    Marland Kitchen oxyCODONE-acetaminophen (ROXICET) 5-325 MG tablet Take 1 tablet by mouth every 6 (six) hours as needed for severe pain. (Patient not taking: Reported on 07/07/2016) 8 tablet 0   Current Facility-Administered Medications  Medication Dose Route Frequency Provider Last Rate Last Dose  . betamethasone acetate-betamethasone sodium phosphate (CELESTONE) injection 3 mg  3 mg Intramuscular Once Edrick Kins, DPM        REVIEW OF SYSTEMS:   [X]  denotes positive finding, [ ]  denotes negative finding Cardiac  Comments:  Chest pain or chest pressure:    Shortness of breath upon exertion:    Short of breath when lying flat:    Irregular heart rhythm:    Constitutional    Fever or chills:      PHYSICAL EXAM:   Vitals:   07/07/16 1534 07/07/16 1536  BP: (!) 149/60 130/63  Pulse: 69 69  Resp: 16   Temp: 98.8 F (37.1 C)   SpO2: 100%   Weight: 225 lb (102.1 kg)   Height: 5' 2"  (1.575 m)     GENERAL: The patient is a well-nourished female, in no acute distress. The vital signs are documented above. CARDIOVASCULAR: There is a regular rate and rhythm. PULMONARY: Non-labored respirations Fistula can be palpated in the upper arm.  I do not palpate a radial pulse on the left  STUDIES:   I have  reviewed her duplex.  Diameter ranges from 0.36-0.58.   MEDICAL ISSUES:   Status post first stage left basilic vein procedure: I studied her fistula with the sono site in the exam room.  The vein does appear to be adequate with the exception of a portion in the distal brachium.  I think I need to proceed with a second stage procedure.  I will evaluate the area near the distal brachium once in the operating room.  I may need to use a angioplasty balloon to dilate this area or potentially resect it with an interposition graft.  The patient will schedule her procedure for a nondialysis day.  Annamarie Major, MD [facility] [phone] Pager [phone]

## 2016-07-08 DIAGNOSIS — N186 End stage renal disease: Secondary | ICD-10-CM | POA: Diagnosis not present

## 2016-07-08 DIAGNOSIS — D688 Other specified coagulation defects: Secondary | ICD-10-CM | POA: Diagnosis not present

## 2016-07-08 DIAGNOSIS — E1129 Type 2 diabetes mellitus with other diabetic kidney complication: Secondary | ICD-10-CM | POA: Diagnosis not present

## 2016-07-08 DIAGNOSIS — T8249XA Other complication of vascular dialysis catheter, initial encounter: Secondary | ICD-10-CM | POA: Diagnosis not present

## 2016-07-08 DIAGNOSIS — Z23 Encounter for immunization: Secondary | ICD-10-CM | POA: Diagnosis not present

## 2016-07-08 DIAGNOSIS — N2581 Secondary hyperparathyroidism of renal origin: Secondary | ICD-10-CM | POA: Diagnosis not present

## 2016-07-08 DIAGNOSIS — D509 Iron deficiency anemia, unspecified: Secondary | ICD-10-CM | POA: Diagnosis not present

## 2016-07-08 DIAGNOSIS — D631 Anemia in chronic kidney disease: Secondary | ICD-10-CM | POA: Diagnosis not present

## 2016-07-10 DIAGNOSIS — Z23 Encounter for immunization: Secondary | ICD-10-CM | POA: Diagnosis not present

## 2016-07-10 DIAGNOSIS — N2581 Secondary hyperparathyroidism of renal origin: Secondary | ICD-10-CM | POA: Diagnosis not present

## 2016-07-10 DIAGNOSIS — E1129 Type 2 diabetes mellitus with other diabetic kidney complication: Secondary | ICD-10-CM | POA: Diagnosis not present

## 2016-07-10 DIAGNOSIS — N186 End stage renal disease: Secondary | ICD-10-CM | POA: Diagnosis not present

## 2016-07-10 DIAGNOSIS — D631 Anemia in chronic kidney disease: Secondary | ICD-10-CM | POA: Diagnosis not present

## 2016-07-10 DIAGNOSIS — T8249XA Other complication of vascular dialysis catheter, initial encounter: Secondary | ICD-10-CM | POA: Diagnosis not present

## 2016-07-10 DIAGNOSIS — D509 Iron deficiency anemia, unspecified: Secondary | ICD-10-CM | POA: Diagnosis not present

## 2016-07-10 DIAGNOSIS — D688 Other specified coagulation defects: Secondary | ICD-10-CM | POA: Diagnosis not present

## 2016-07-12 ENCOUNTER — Encounter (HOSPITAL_COMMUNITY): Payer: Medicare Other

## 2016-07-13 DIAGNOSIS — N2581 Secondary hyperparathyroidism of renal origin: Secondary | ICD-10-CM | POA: Diagnosis not present

## 2016-07-13 DIAGNOSIS — N186 End stage renal disease: Secondary | ICD-10-CM | POA: Diagnosis not present

## 2016-07-13 DIAGNOSIS — Z23 Encounter for immunization: Secondary | ICD-10-CM | POA: Diagnosis not present

## 2016-07-13 DIAGNOSIS — D631 Anemia in chronic kidney disease: Secondary | ICD-10-CM | POA: Diagnosis not present

## 2016-07-13 DIAGNOSIS — T8249XA Other complication of vascular dialysis catheter, initial encounter: Secondary | ICD-10-CM | POA: Diagnosis not present

## 2016-07-13 DIAGNOSIS — D688 Other specified coagulation defects: Secondary | ICD-10-CM | POA: Diagnosis not present

## 2016-07-13 DIAGNOSIS — E1129 Type 2 diabetes mellitus with other diabetic kidney complication: Secondary | ICD-10-CM | POA: Diagnosis not present

## 2016-07-13 DIAGNOSIS — D509 Iron deficiency anemia, unspecified: Secondary | ICD-10-CM | POA: Diagnosis not present

## 2016-07-15 DIAGNOSIS — D688 Other specified coagulation defects: Secondary | ICD-10-CM | POA: Diagnosis not present

## 2016-07-15 DIAGNOSIS — D631 Anemia in chronic kidney disease: Secondary | ICD-10-CM | POA: Diagnosis not present

## 2016-07-15 DIAGNOSIS — E1129 Type 2 diabetes mellitus with other diabetic kidney complication: Secondary | ICD-10-CM | POA: Diagnosis not present

## 2016-07-15 DIAGNOSIS — N2581 Secondary hyperparathyroidism of renal origin: Secondary | ICD-10-CM | POA: Diagnosis not present

## 2016-07-15 DIAGNOSIS — T8249XA Other complication of vascular dialysis catheter, initial encounter: Secondary | ICD-10-CM | POA: Diagnosis not present

## 2016-07-15 DIAGNOSIS — D509 Iron deficiency anemia, unspecified: Secondary | ICD-10-CM | POA: Diagnosis not present

## 2016-07-15 DIAGNOSIS — Z23 Encounter for immunization: Secondary | ICD-10-CM | POA: Diagnosis not present

## 2016-07-15 DIAGNOSIS — N186 End stage renal disease: Secondary | ICD-10-CM | POA: Diagnosis not present

## 2016-07-17 DIAGNOSIS — N186 End stage renal disease: Secondary | ICD-10-CM | POA: Diagnosis not present

## 2016-07-17 DIAGNOSIS — D688 Other specified coagulation defects: Secondary | ICD-10-CM | POA: Diagnosis not present

## 2016-07-17 DIAGNOSIS — Z23 Encounter for immunization: Secondary | ICD-10-CM | POA: Diagnosis not present

## 2016-07-17 DIAGNOSIS — Z992 Dependence on renal dialysis: Secondary | ICD-10-CM | POA: Diagnosis not present

## 2016-07-17 DIAGNOSIS — D509 Iron deficiency anemia, unspecified: Secondary | ICD-10-CM | POA: Diagnosis not present

## 2016-07-17 DIAGNOSIS — D631 Anemia in chronic kidney disease: Secondary | ICD-10-CM | POA: Diagnosis not present

## 2016-07-17 DIAGNOSIS — E1129 Type 2 diabetes mellitus with other diabetic kidney complication: Secondary | ICD-10-CM | POA: Diagnosis not present

## 2016-07-17 DIAGNOSIS — N2581 Secondary hyperparathyroidism of renal origin: Secondary | ICD-10-CM | POA: Diagnosis not present

## 2016-07-17 DIAGNOSIS — T8249XA Other complication of vascular dialysis catheter, initial encounter: Secondary | ICD-10-CM | POA: Diagnosis not present

## 2016-07-20 ENCOUNTER — Other Ambulatory Visit: Payer: Self-pay

## 2016-07-20 DIAGNOSIS — Z23 Encounter for immunization: Secondary | ICD-10-CM | POA: Diagnosis not present

## 2016-07-20 DIAGNOSIS — D688 Other specified coagulation defects: Secondary | ICD-10-CM | POA: Diagnosis not present

## 2016-07-20 DIAGNOSIS — N186 End stage renal disease: Secondary | ICD-10-CM | POA: Diagnosis not present

## 2016-07-20 DIAGNOSIS — T8249XA Other complication of vascular dialysis catheter, initial encounter: Secondary | ICD-10-CM | POA: Diagnosis not present

## 2016-07-20 DIAGNOSIS — N2581 Secondary hyperparathyroidism of renal origin: Secondary | ICD-10-CM | POA: Diagnosis not present

## 2016-07-20 DIAGNOSIS — D509 Iron deficiency anemia, unspecified: Secondary | ICD-10-CM | POA: Diagnosis not present

## 2016-07-20 DIAGNOSIS — E1129 Type 2 diabetes mellitus with other diabetic kidney complication: Secondary | ICD-10-CM | POA: Diagnosis not present

## 2016-07-22 DIAGNOSIS — D688 Other specified coagulation defects: Secondary | ICD-10-CM | POA: Diagnosis not present

## 2016-07-22 DIAGNOSIS — N186 End stage renal disease: Secondary | ICD-10-CM | POA: Diagnosis not present

## 2016-07-22 DIAGNOSIS — T8249XA Other complication of vascular dialysis catheter, initial encounter: Secondary | ICD-10-CM | POA: Diagnosis not present

## 2016-07-22 DIAGNOSIS — E1129 Type 2 diabetes mellitus with other diabetic kidney complication: Secondary | ICD-10-CM | POA: Diagnosis not present

## 2016-07-22 DIAGNOSIS — N2581 Secondary hyperparathyroidism of renal origin: Secondary | ICD-10-CM | POA: Diagnosis not present

## 2016-07-22 DIAGNOSIS — Z23 Encounter for immunization: Secondary | ICD-10-CM | POA: Diagnosis not present

## 2016-07-22 DIAGNOSIS — D509 Iron deficiency anemia, unspecified: Secondary | ICD-10-CM | POA: Diagnosis not present

## 2016-07-24 DIAGNOSIS — N2581 Secondary hyperparathyroidism of renal origin: Secondary | ICD-10-CM | POA: Diagnosis not present

## 2016-07-24 DIAGNOSIS — D688 Other specified coagulation defects: Secondary | ICD-10-CM | POA: Diagnosis not present

## 2016-07-24 DIAGNOSIS — D509 Iron deficiency anemia, unspecified: Secondary | ICD-10-CM | POA: Diagnosis not present

## 2016-07-24 DIAGNOSIS — E1129 Type 2 diabetes mellitus with other diabetic kidney complication: Secondary | ICD-10-CM | POA: Diagnosis not present

## 2016-07-24 DIAGNOSIS — T8249XA Other complication of vascular dialysis catheter, initial encounter: Secondary | ICD-10-CM | POA: Diagnosis not present

## 2016-07-24 DIAGNOSIS — N186 End stage renal disease: Secondary | ICD-10-CM | POA: Diagnosis not present

## 2016-07-24 DIAGNOSIS — Z23 Encounter for immunization: Secondary | ICD-10-CM | POA: Diagnosis not present

## 2016-07-26 ENCOUNTER — Ambulatory Visit (INDEPENDENT_AMBULATORY_CARE_PROVIDER_SITE_OTHER): Payer: Medicare Other | Admitting: Family Medicine

## 2016-07-26 ENCOUNTER — Encounter: Payer: Self-pay | Admitting: Family Medicine

## 2016-07-26 VITALS — BP 132/74 | HR 74 | Temp 98.2°F | Ht 62.0 in | Wt 226.0 lb

## 2016-07-26 DIAGNOSIS — E118 Type 2 diabetes mellitus with unspecified complications: Secondary | ICD-10-CM | POA: Diagnosis not present

## 2016-07-26 DIAGNOSIS — Z794 Long term (current) use of insulin: Secondary | ICD-10-CM

## 2016-07-26 DIAGNOSIS — E1122 Type 2 diabetes mellitus with diabetic chronic kidney disease: Secondary | ICD-10-CM | POA: Diagnosis not present

## 2016-07-26 DIAGNOSIS — R21 Rash and other nonspecific skin eruption: Secondary | ICD-10-CM

## 2016-07-26 DIAGNOSIS — N185 Chronic kidney disease, stage 5: Secondary | ICD-10-CM | POA: Diagnosis not present

## 2016-07-26 LAB — POCT GLYCOSYLATED HEMOGLOBIN (HGB A1C): HEMOGLOBIN A1C: 7.7

## 2016-07-26 MED ORDER — TRIAMCINOLONE ACETONIDE 0.1 % EX CREA: 1.0000 "application " | TOPICAL_CREAM | Freq: Two times a day (BID) | CUTANEOUS | 2 refills | Status: DC | PRN

## 2016-07-26 NOTE — Progress Notes (Signed)
   Subjective:    Patient ID: ENIOLA CERULLO is a 76 y.o. female presenting with Rash  on 07/26/2016  HPI: 7-10 day h/o itchy spot on right butt cheek. Has used Hydrocortisone x 1 wk with no resolution. It has not gotten worse or better. No history of this in the past. Does not own cats. No shot lately. On Dialysis Tu, Thurs Sat. Recent shot for Hepatitis B about 1 month ago.  Review of Systems  Constitutional: Negative for chills and fever.  Respiratory: Negative for shortness of breath.   Cardiovascular: Negative for chest pain.  Gastrointestinal: Negative for abdominal pain, nausea and vomiting.  Genitourinary: Negative for dysuria.  Skin: Negative for rash.      Objective:    BP 132/74   Pulse 74   Temp 98.2 F (36.8 C) (Oral)   Ht 5\' 2"  (1.575 m)   Wt 226 lb (102.5 kg)   SpO2 99%   BMI 41.34 kg/m  Physical Exam  Constitutional: She is oriented to person, place, and time. She appears well-developed and well-nourished. No distress.  HENT:  Head: Normocephalic and atraumatic.  Eyes: No scleral icterus.  Neck: Neck supple.  Cardiovascular: Normal rate.   Pulmonary/Chest: Effort normal.  Abdominal: Soft.  Neurological: She is alert and oriented to person, place, and time.  Skin: Skin is warm and dry.  3 x 4 cm darkened area on right buttock consistent with eczematous rash  Psychiatric: She has a normal mood and affect.        Assessment & Plan:   DM type 2, controlled, with complication ([place]) - continue Lantus - Plan: HgB A1c  Rash and nonspecific skin eruption - refilled Kenalog as OTC is not working. - Plan: triamcinolone cream (KENALOG) 0.1 %  Type 2 diabetes mellitus with stage 5 chronic kidney disease not on chronic dialysis, with long-term current use of insulin ([place]) - continue dialysis   Total face-to-face time with patient: 15 minutes. Over 50% of encounter was spent on counseling and coordination of care. No Follow-up on file.  Donnamae Jude 07/26/2016 3:35 PM

## 2016-07-26 NOTE — Patient Instructions (Signed)

## 2016-07-27 DIAGNOSIS — N186 End stage renal disease: Secondary | ICD-10-CM | POA: Diagnosis not present

## 2016-07-27 DIAGNOSIS — D509 Iron deficiency anemia, unspecified: Secondary | ICD-10-CM | POA: Diagnosis not present

## 2016-07-27 DIAGNOSIS — E1129 Type 2 diabetes mellitus with other diabetic kidney complication: Secondary | ICD-10-CM | POA: Diagnosis not present

## 2016-07-27 DIAGNOSIS — D688 Other specified coagulation defects: Secondary | ICD-10-CM | POA: Diagnosis not present

## 2016-07-27 DIAGNOSIS — T8249XA Other complication of vascular dialysis catheter, initial encounter: Secondary | ICD-10-CM | POA: Diagnosis not present

## 2016-07-27 DIAGNOSIS — Z23 Encounter for immunization: Secondary | ICD-10-CM | POA: Diagnosis not present

## 2016-07-27 DIAGNOSIS — N2581 Secondary hyperparathyroidism of renal origin: Secondary | ICD-10-CM | POA: Diagnosis not present

## 2016-07-29 DIAGNOSIS — E1129 Type 2 diabetes mellitus with other diabetic kidney complication: Secondary | ICD-10-CM | POA: Diagnosis not present

## 2016-07-29 DIAGNOSIS — T8249XA Other complication of vascular dialysis catheter, initial encounter: Secondary | ICD-10-CM | POA: Diagnosis not present

## 2016-07-29 DIAGNOSIS — Z23 Encounter for immunization: Secondary | ICD-10-CM | POA: Diagnosis not present

## 2016-07-29 DIAGNOSIS — D688 Other specified coagulation defects: Secondary | ICD-10-CM | POA: Diagnosis not present

## 2016-07-29 DIAGNOSIS — D509 Iron deficiency anemia, unspecified: Secondary | ICD-10-CM | POA: Diagnosis not present

## 2016-07-29 DIAGNOSIS — N2581 Secondary hyperparathyroidism of renal origin: Secondary | ICD-10-CM | POA: Diagnosis not present

## 2016-07-29 DIAGNOSIS — N186 End stage renal disease: Secondary | ICD-10-CM | POA: Diagnosis not present

## 2016-07-31 DIAGNOSIS — E1129 Type 2 diabetes mellitus with other diabetic kidney complication: Secondary | ICD-10-CM | POA: Diagnosis not present

## 2016-07-31 DIAGNOSIS — N186 End stage renal disease: Secondary | ICD-10-CM | POA: Diagnosis not present

## 2016-07-31 DIAGNOSIS — D688 Other specified coagulation defects: Secondary | ICD-10-CM | POA: Diagnosis not present

## 2016-07-31 DIAGNOSIS — T8249XA Other complication of vascular dialysis catheter, initial encounter: Secondary | ICD-10-CM | POA: Diagnosis not present

## 2016-07-31 DIAGNOSIS — D509 Iron deficiency anemia, unspecified: Secondary | ICD-10-CM | POA: Diagnosis not present

## 2016-07-31 DIAGNOSIS — N2581 Secondary hyperparathyroidism of renal origin: Secondary | ICD-10-CM | POA: Diagnosis not present

## 2016-07-31 DIAGNOSIS — Z23 Encounter for immunization: Secondary | ICD-10-CM | POA: Diagnosis not present

## 2016-08-03 DIAGNOSIS — N186 End stage renal disease: Secondary | ICD-10-CM | POA: Diagnosis not present

## 2016-08-03 DIAGNOSIS — E1129 Type 2 diabetes mellitus with other diabetic kidney complication: Secondary | ICD-10-CM | POA: Diagnosis not present

## 2016-08-03 DIAGNOSIS — T8249XA Other complication of vascular dialysis catheter, initial encounter: Secondary | ICD-10-CM | POA: Diagnosis not present

## 2016-08-03 DIAGNOSIS — Z23 Encounter for immunization: Secondary | ICD-10-CM | POA: Diagnosis not present

## 2016-08-03 DIAGNOSIS — N2581 Secondary hyperparathyroidism of renal origin: Secondary | ICD-10-CM | POA: Diagnosis not present

## 2016-08-03 DIAGNOSIS — D688 Other specified coagulation defects: Secondary | ICD-10-CM | POA: Diagnosis not present

## 2016-08-03 DIAGNOSIS — D509 Iron deficiency anemia, unspecified: Secondary | ICD-10-CM | POA: Diagnosis not present

## 2016-08-03 NOTE — Progress Notes (Signed)
I instructed patient to take 15 units Lantus tonight.  I instructed patient to check CBG to check CBG and if it is less than 70 to treat it with  1/2 cup of clear juice like apple juice or cranberry juice. I instructed patient to recheck CBG in 15 minutes and if CBG is not greater than 70, to  Call [phone] (pre- op). If it is before pre-op opens to retreat as before and recheck CBG in 15 minutes. I told patient to make note of time that liquid is taken and amount, that surgical time may have to be adjusted.

## 2016-08-04 ENCOUNTER — Ambulatory Visit (HOSPITAL_COMMUNITY)
Admission: RE | Admit: 2016-08-04 | Discharge: 2016-08-04 | Disposition: A | Discharge status: Home / Self Care | Payer: Medicare Other | Source: Ambulatory Visit | Attending: Surgery | Admitting: Surgery

## 2016-08-04 ENCOUNTER — Ambulatory Visit (HOSPITAL_COMMUNITY): Payer: Medicare Other | Admitting: Certified Registered Nurse Anesthetist

## 2016-08-04 ENCOUNTER — Encounter (HOSPITAL_COMMUNITY)
Admission: RE | Disposition: A | Discharge status: Home / Self Care | Payer: Self-pay | Source: Ambulatory Visit | Attending: Surgery

## 2016-08-04 ENCOUNTER — Encounter (HOSPITAL_COMMUNITY): Payer: Self-pay | Admitting: *Deleted

## 2016-08-04 ENCOUNTER — Telehealth: Payer: Self-pay | Admitting: Surgery

## 2016-08-04 DIAGNOSIS — N186 End stage renal disease: Secondary | ICD-10-CM | POA: Insufficient documentation

## 2016-08-04 DIAGNOSIS — Z794 Long term (current) use of insulin: Secondary | ICD-10-CM | POA: Diagnosis not present

## 2016-08-04 DIAGNOSIS — E785 Hyperlipidemia, unspecified: Secondary | ICD-10-CM | POA: Diagnosis not present

## 2016-08-04 DIAGNOSIS — I120 Hypertensive chronic kidney disease with stage 5 chronic kidney disease or end stage renal disease: Secondary | ICD-10-CM | POA: Diagnosis not present

## 2016-08-04 DIAGNOSIS — Z6841 Body Mass Index (BMI) 40.0 and over, adult: Secondary | ICD-10-CM | POA: Diagnosis not present

## 2016-08-04 DIAGNOSIS — Z79899 Other long term (current) drug therapy: Secondary | ICD-10-CM | POA: Insufficient documentation

## 2016-08-04 DIAGNOSIS — I10 Essential (primary) hypertension: Secondary | ICD-10-CM

## 2016-08-04 DIAGNOSIS — I509 Heart failure, unspecified: Secondary | ICD-10-CM | POA: Insufficient documentation

## 2016-08-04 DIAGNOSIS — Z7982 Long term (current) use of aspirin: Secondary | ICD-10-CM | POA: Insufficient documentation

## 2016-08-04 DIAGNOSIS — E1122 Type 2 diabetes mellitus with diabetic chronic kidney disease: Secondary | ICD-10-CM | POA: Diagnosis not present

## 2016-08-04 DIAGNOSIS — I132 Hypertensive heart and chronic kidney disease with heart failure and with stage 5 chronic kidney disease, or end stage renal disease: Secondary | ICD-10-CM | POA: Diagnosis not present

## 2016-08-04 DIAGNOSIS — E1151 Type 2 diabetes mellitus with diabetic peripheral angiopathy without gangrene: Secondary | ICD-10-CM | POA: Diagnosis not present

## 2016-08-04 HISTORY — PX: BASCILIC VEIN TRANSPOSITION: SHX5742

## 2016-08-04 LAB — POCT I-STAT 4, (NA,K, GLUC, HGB,HCT)
Glucose, Bld: 112 mg/dL — ABNORMAL HIGH (ref 65–99)
HEMATOCRIT: 42 % (ref 36.0–46.0)
Hemoglobin: 14.3 g/dL (ref 12.0–15.0)
Potassium: 4.2 mmol/L (ref 3.5–5.1)
Sodium: 137 mmol/L (ref 135–145)

## 2016-08-04 LAB — GLUCOSE, CAPILLARY: GLUCOSE-CAPILLARY: 106 mg/dL — AB (ref 65–99)

## 2016-08-04 SURGERY — TRANSPOSITION, VEIN, BASILIC
Anesthesia: Monitor Anesthesia Care | Site: Arm Upper | Laterality: Left

## 2016-08-04 MED ORDER — LIDOCAINE-EPINEPHRINE 1 %-1:100000 IJ SOLN
INTRAMUSCULAR | Status: AC
  Filled 2016-08-04: qty 1

## 2016-08-04 MED ORDER — LIDOCAINE HCL 1 % IJ SOLN
INTRAMUSCULAR | Status: AC
  Filled 2016-08-04: qty 20

## 2016-08-04 MED ORDER — SODIUM CHLORIDE 0.9 % IV SOLN
INTRAVENOUS | Status: DC
  Administered 2016-08-04: 11:00:00 via INTRAVENOUS

## 2016-08-04 MED ORDER — ATORVASTATIN CALCIUM 40 MG PO TABS: ORAL_TABLET | ORAL | 0 refills | Status: DC

## 2016-08-04 MED ORDER — LIDOCAINE-EPINEPHRINE (PF) 1 %-1:200000 IJ SOLN
INTRAMUSCULAR | Status: AC
  Filled 2016-08-04: qty 30

## 2016-08-04 MED ORDER — PHENYLEPHRINE HCL 10 MG/ML IJ SOLN
INTRAVENOUS | Status: DC | PRN
  Administered 2016-08-04: 25 ug/min via INTRAVENOUS

## 2016-08-04 MED ORDER — AMLODIPINE BESYLATE 10 MG PO TABS: 5.0000 mg | ORAL_TABLET | Freq: Every evening | ORAL | Status: DC

## 2016-08-04 MED ORDER — 0.9 % SODIUM CHLORIDE (POUR BTL) OPTIME
TOPICAL | Status: DC | PRN
  Administered 2016-08-04: 1000 mL

## 2016-08-04 MED ORDER — ROCURONIUM BROMIDE 50 MG/5ML IV SOSY
PREFILLED_SYRINGE | INTRAVENOUS | Status: AC
  Filled 2016-08-04: qty 5

## 2016-08-04 MED ORDER — ONDANSETRON HCL 4 MG/2ML IJ SOLN
INTRAMUSCULAR | Status: AC
  Filled 2016-08-04: qty 2

## 2016-08-04 MED ORDER — CHLORHEXIDINE GLUCONATE CLOTH 2 % EX PADS: 6.0000 | MEDICATED_PAD | Freq: Once | CUTANEOUS | Status: DC

## 2016-08-04 MED ORDER — LIDOCAINE 2% (20 MG/ML) 5 ML SYRINGE
INTRAMUSCULAR | Status: AC
  Filled 2016-08-04: qty 5

## 2016-08-04 MED ORDER — FENTANYL CITRATE (PF) 100 MCG/2ML IJ SOLN
INTRAMUSCULAR | Status: DC | PRN
  Administered 2016-08-04 (×3): 25 ug via INTRAVENOUS

## 2016-08-04 MED ORDER — FENTANYL CITRATE (PF) 250 MCG/5ML IJ SOLN
INTRAMUSCULAR | Status: AC
  Filled 2016-08-04: qty 5

## 2016-08-04 MED ORDER — OXYCODONE-ACETAMINOPHEN 5-325 MG PO TABS: 1.0000 | ORAL_TABLET | Freq: Four times a day (QID) | ORAL | 0 refills | Status: DC | PRN

## 2016-08-04 MED ORDER — PROPOFOL 500 MG/50ML IV EMUL
INTRAVENOUS | Status: DC | PRN
  Administered 2016-08-04: 50 ug/kg/min via INTRAVENOUS

## 2016-08-04 MED ORDER — DEXTROSE 5 % IV SOLN
1.5000 g | INTRAVENOUS | Status: AC
  Administered 2016-08-04: 1.5 g via INTRAVENOUS
  Filled 2016-08-04: qty 1.5

## 2016-08-04 MED ORDER — LIDOCAINE-EPINEPHRINE 1 %-1:100000 IJ SOLN: INTRAMUSCULAR | Status: DC | PRN

## 2016-08-04 MED ORDER — PROPOFOL 10 MG/ML IV BOLUS
INTRAVENOUS | Status: AC
  Filled 2016-08-04: qty 20

## 2016-08-04 MED ORDER — SODIUM CHLORIDE 0.9 % IV SOLN
INTRAVENOUS | Status: DC | PRN
  Administered 2016-08-04: 500 mL

## 2016-08-04 MED ORDER — LIDOCAINE-EPINEPHRINE (PF) 1 %-1:200000 IJ SOLN
INTRAMUSCULAR | Status: DC | PRN
  Administered 2016-08-04: 30 mL

## 2016-08-04 MED ORDER — MIDAZOLAM HCL 2 MG/2ML IJ SOLN
INTRAMUSCULAR | Status: AC
  Filled 2016-08-04: qty 2

## 2016-08-04 SURGICAL SUPPLY — 46 items
ARMBAND PINK RESTRICT EXTREMIT (MISCELLANEOUS) ×3 IMPLANT
CANISTER SUCT 3000ML PPV (MISCELLANEOUS) ×3 IMPLANT
CLIP TI MEDIUM 24 (CLIP) IMPLANT
CLIP TI MEDIUM 6 (CLIP) ×3 IMPLANT
CLIP TI WIDE RED SMALL 24 (CLIP) IMPLANT
CLIP TI WIDE RED SMALL 6 (CLIP) ×6 IMPLANT
COVER PROBE W GEL 5X96 (DRAPES) ×3 IMPLANT
DERMABOND ADHESIVE PROPEN (GAUZE/BANDAGES/DRESSINGS) ×2
DERMABOND ADVANCED (GAUZE/BANDAGES/DRESSINGS) ×2
DERMABOND ADVANCED .7 DNX12 (GAUZE/BANDAGES/DRESSINGS) ×1 IMPLANT
DERMABOND ADVANCED .7 DNX6 (GAUZE/BANDAGES/DRESSINGS) ×1 IMPLANT
ELECT REM PT RETURN 9FT ADLT (ELECTROSURGICAL) ×3
ELECTRODE REM PT RTRN 9FT ADLT (ELECTROSURGICAL) ×1 IMPLANT
GLOVE BIO SURGEON STRL SZ 6.5 (GLOVE) ×2 IMPLANT
GLOVE BIO SURGEONS STRL SZ 6.5 (GLOVE) ×1
GLOVE BIOGEL PI IND STRL 6.5 (GLOVE) ×1 IMPLANT
GLOVE BIOGEL PI IND STRL 7.0 (GLOVE) ×1 IMPLANT
GLOVE BIOGEL PI IND STRL 7.5 (GLOVE) ×1 IMPLANT
GLOVE BIOGEL PI INDICATOR 6.5 (GLOVE) ×2
GLOVE BIOGEL PI INDICATOR 7.0 (GLOVE) ×2
GLOVE BIOGEL PI INDICATOR 7.5 (GLOVE) ×2
GLOVE SURG SS PI 6.0 STRL IVOR (GLOVE) ×3 IMPLANT
GLOVE SURG SS PI 7.5 STRL IVOR (GLOVE) ×3 IMPLANT
GOWN STRL REUS W/ TWL LRG LVL3 (GOWN DISPOSABLE) ×2 IMPLANT
GOWN STRL REUS W/ TWL XL LVL3 (GOWN DISPOSABLE) ×1 IMPLANT
GOWN STRL REUS W/TWL LRG LVL3 (GOWN DISPOSABLE) ×4
GOWN STRL REUS W/TWL XL LVL3 (GOWN DISPOSABLE) ×2
HEMOSTAT SNOW SURGICEL 2X4 (HEMOSTASIS) IMPLANT
KIT BASIN OR (CUSTOM PROCEDURE TRAY) ×3 IMPLANT
KIT ROOM TURNOVER OR (KITS) ×3 IMPLANT
NS IRRIG 1000ML POUR BTL (IV SOLUTION) ×3 IMPLANT
PACK CV ACCESS (CUSTOM PROCEDURE TRAY) ×3 IMPLANT
PAD ARMBOARD 7.5X6 YLW CONV (MISCELLANEOUS) ×6 IMPLANT
SUT PROLENE 6 0 CC (SUTURE) ×3 IMPLANT
SUT SILK 2 0 FS (SUTURE) ×3 IMPLANT
SUT SILK 2 0 SH (SUTURE) IMPLANT
SUT SILK 3 0 (SUTURE) ×2
SUT SILK 3-0 18XBRD TIE 12 (SUTURE) ×1 IMPLANT
SUT VIC AB 2-0 CT1 27 (SUTURE) ×4
SUT VIC AB 2-0 CT1 TAPERPNT 27 (SUTURE) ×2 IMPLANT
SUT VIC AB 3-0 SH 27 (SUTURE) ×2
SUT VIC AB 3-0 SH 27X BRD (SUTURE) ×1 IMPLANT
SUT VIC AB 4-0 PS2 27 (SUTURE) ×3 IMPLANT
SUT VICRYL 4-0 PS2 18IN ABS (SUTURE) ×3 IMPLANT
UNDERPAD 30X30 (UNDERPADS AND DIAPERS) ×3 IMPLANT
WATER STERILE IRR 1000ML POUR (IV SOLUTION) ×3 IMPLANT

## 2016-08-04 NOTE — Telephone Encounter (Signed)
LVM on home #, mailed lttr for PO on 5/21

## 2016-08-04 NOTE — Op Note (Signed)
    Patient name: Rachel Hobbs MRN: [id number] DOB: 11/24/1940 Sex: female  08/04/2016 Pre-operative Diagnosis: ESRD Post-operative diagnosis:  Same Surgeon:  Annamarie Major Assistants:  Silva Bandy Procedure:   2nd stage left basilic vein fistula Anesthesia:  MAC Blood Loss:  See anesthesia record Specimens:  none  Findings:  Adequate 5 mm vein  Indications:  The patient is here today for her 2nd stage proceedure  Procedure:  The patient was identified in the holding area and taken to [place] 16  The patient was then placed supine on the table. MAC anesthesia was administered.  The patient was prepped and draped in the usual sterile fashion.  A time out was called and antibiotics were administered.  U/S was used to localize the vein.  1% lidocaine was used for local anesthesia.  @ incisions in the upper arm were made, through which the vein was exposed and circumferentially isolated.  Multiple side branches were ligated between silk ties.  The nerve was identified and protected.  Once the vein was isolated from the [facility] to the axilla, it was marked for orientation.  It was then transected near the [facility].  A curved tunneller was used to create a superficial anterior tunnel.  The vein was brought through the tunnel.  The ends of the fistula were spatulated and a end to end anastamosis was created with 6-0 prolene.  I was able to pass a 5 dilator throughout the vein.  Once completed, there was a excellent palpable thrill within the vein.  Hemostasis was obtained and the incision was closed with 2-0 and 4-0 vicryl followed by dermabond.   Disposition:  To PACU stable   V. Annamarie Major, M.D. [facility] [place] Office: [phone] Pager:  [phone]

## 2016-08-04 NOTE — Transfer of Care (Signed)
Immediate Anesthesia Transfer of Care Note  Patient: Rachel Hobbs  Procedure(s) Performed: Procedure(s): LEFT UPPER ARM BASCILIC VEIN TRANSPOSITION, SECOND STAGE (Left)  Patient Location: PACU  Anesthesia Type:MAC  Level of Consciousness: awake, alert , oriented and patient cooperative  Airway & Oxygen Therapy: Patient Spontanous Breathing and Patient connected to nasal cannula oxygen  Post-op Assessment: Report given to RN and Post -op Vital signs reviewed and stable  Post vital signs: Reviewed and stable  Last Vitals:  Vitals:   08/04/16 1035  BP: (!) 153/58  Pulse: 69  Resp: 18  Temp: 36.5 C    Last Pain:  Vitals:   08/04/16 1035  TempSrc: Oral  PainSc: 0-No pain      Patients Stated Pain Goal: 1 (88/50/27 7412)  Complications: No apparent anesthesia complications

## 2016-08-04 NOTE — Anesthesia Procedure Notes (Signed)
Procedure Name: MAC Date/Time: 08/04/2016 11:15 AM Performed by: Everlean Cherry A Pre-anesthesia Checklist: Patient identified, Suction available, Emergency Drugs available and Patient being monitored Patient Re-evaluated:Patient Re-evaluated prior to inductionOxygen Delivery Method: Nasal cannula

## 2016-08-04 NOTE — H&P (Signed)
Patient name: Rachel Hobbs  MRN: [id number]        DOB: March 27, 1941            Sex: female  REASON FOR VISIT:     post op fistula  HISTORY OF PRESENT ILLNESS:   CORRETTA Hobbs is a 76 y.o. female who is status post first stage left basilic vein transposition on 06/04/2016.  She has a history of a failed left brachiocephalic fistula on 24/46/2863.  She has mild complaints of coolness in her left hand  CURRENT MEDICATIONS:          Current Outpatient Prescriptions  Medication Sig Dispense Refill  . acetaminophen (TYLENOL) 500 MG tablet Take 1,000 mg by mouth every 6 (six) hours as needed (pain).     Marland Kitchen amLODipine (NORVASC) 10 MG tablet Take 1 tablet (10 mg total) by mouth daily. 90 tablet 3  . aspirin 81 MG tablet Take 81 mg by mouth daily.      Marland Kitchen atorvastatin (LIPITOR) 40 MG tablet TAKE 1 TABLET BY MOUTH EVERY DAY FOR CHOLESTEROL 90 tablet 0  . Blood Glucose Monitoring Suppl (BLOOD GLUCOSE METER) kit Use as instructed 1 each 0  . calcitRIOL (ROCALTROL) 0.25 MCG capsule Take 0.25 mcg by mouth daily. Take on Monday, Wednesday and Friday     . calcium acetate (PHOSLO) 667 MG capsule Take 1 capsule by mouth See admin instructions. Pt may take 1 capsule with largest meal of the day    . calcium carbonate (TUMS - DOSED IN MG ELEMENTAL CALCIUM) 500 MG chewable tablet Chew 1 tablet (200 mg of elemental calcium total) by mouth 3 (three) times daily with meals. (Patient taking differently: Chew 1 tablet by mouth 3 (three) times daily as needed for indigestion or heartburn. ) 90 tablet 1  . carvedilol (COREG) 12.5 MG tablet Take 6.25 mg by mouth 2 (two) times daily with a meal.     . ethyl chloride spray PLACE 1 SPRAY ONTO THE SKIN 3 TIMES A WEEK UTD  5  . Insulin Glargine (LANTUS SOLOSTAR) 100 UNIT/ML Solostar Pen INJECT 30 UNITS INTO THE SKIN EVERY NIGHT AFTER SUPPER.    . Insulin Pen Needle 31G X 8 MM MISC BD UltraFine III Pen Needles. For use with insulin pen  device. Inject insulin 6 x daily 100 each 3  . isosorbide mononitrate (IMDUR) 30 MG 24 hr tablet Take 30 mg by mouth daily.    . Lancets (ONETOUCH ULTRASOFT) lancets Once daily testing plus prn for hypoglycemia 100 each 9  . Multiple Vitamin (MULTIVITAMIN WITH MINERALS) TABS tablet Take 1 tablet by mouth daily.    Marland Kitchen NEEDLE, DISP, 30 G (B-D DISP NEEDLE 30GX1") 30G X 1" MISC 1 each by Does not apply route daily. 100 each 2  . ONE TOUCH ULTRA TEST test strip TEST BLOOD SUGAR EVERY DAY AND AS NEEDED FOR SYMPTOMS OF HYPOGLYCEMIA 100 each 3  . senna (SENOKOT) 8.6 MG tablet Take 1 tablet by mouth daily as needed for constipation.    . triamcinolone cream (KENALOG) 0.1 % Apply 1 application topically 2 (two) times daily as needed.    Marland Kitchen oxyCODONE-acetaminophen (ROXICET) 5-325 MG tablet Take 1 tablet by mouth every 6 (six) hours as needed for severe pain. (Patient not taking: Reported on 07/07/2016) 8 tablet 0            Current Facility-Administered Medications  Medication Dose Route Frequency Provider Last Rate Last Dose  . betamethasone acetate-betamethasone sodium phosphate (CELESTONE)  injection 3 mg  3 mg Intramuscular Once Edrick Kins, DPM        REVIEW OF SYSTEMS:   [X]  denotes positive finding, [ ]  denotes negative finding Cardiac  Comments:  Chest pain or chest pressure:    Shortness of breath upon exertion:    Short of breath when lying flat:    Irregular heart rhythm:    Constitutional    Fever or chills:      PHYSICAL EXAM:       Vitals:   07/07/16 1534 07/07/16 1536  BP: (!) 149/60 130/63  Pulse: 69 69  Resp: 16   Temp: 98.8 F (37.1 C)   SpO2: 100%   Weight: 225 lb (102.1 kg)   Height: 5' 2"  (1.575 m)     GENERAL: The patient is a well-nourished female, in no acute distress. The vital signs are documented above. CARDIOVASCULAR: There is a regular rate and rhythm. PULMONARY: Non-labored respirations Fistula can be palpated in the  upper arm.  I do not palpate a radial pulse on the left  STUDIES:   I have reviewed her duplex.  Diameter ranges from 0.36-0.58.   MEDICAL ISSUES:   Status post first stage left basilic vein procedure: I studied her fistula with the sono site in the exam room.  The vein does appear to be adequate with the exception of a portion in the distal brachium.  I think I need to proceed with a second stage procedure.  I will evaluate the area near the distal brachium once in the operating room.  I may need to use a angioplasty balloon to dilate this area or potentially resect it with an interposition graft.  The patient will schedule her procedure for a nondialysis day.  Annamarie Major, MD [facility] [phone] Pager [phone]    No interval changes.  Plan for 2nd stage BVT.  Risk and benefits again discussed with patient who wishes to proceed  Annamarie Major

## 2016-08-04 NOTE — Telephone Encounter (Signed)
-----   Message from Mena Goes, RN sent at 08/04/2016  2:40 PM EDT ----- Regarding: 4-6 weeks   ----- Message ----- From: Alvia Grove, PA-C Sent: 08/04/2016   1:11 PM To: Vvs Charge Pool  s/p 2nd stage left basilic vein transposition 08/04/16  f/u with Dr. Trula Slade or PA in 4-6 weeks. No duplex.   Thanks Maudie Mercury

## 2016-08-05 ENCOUNTER — Encounter (HOSPITAL_COMMUNITY): Payer: Self-pay | Admitting: Surgery

## 2016-08-05 DIAGNOSIS — T8249XA Other complication of vascular dialysis catheter, initial encounter: Secondary | ICD-10-CM | POA: Diagnosis not present

## 2016-08-05 DIAGNOSIS — N186 End stage renal disease: Secondary | ICD-10-CM | POA: Diagnosis not present

## 2016-08-05 DIAGNOSIS — Z23 Encounter for immunization: Secondary | ICD-10-CM | POA: Diagnosis not present

## 2016-08-05 DIAGNOSIS — D509 Iron deficiency anemia, unspecified: Secondary | ICD-10-CM | POA: Diagnosis not present

## 2016-08-05 DIAGNOSIS — E1129 Type 2 diabetes mellitus with other diabetic kidney complication: Secondary | ICD-10-CM | POA: Diagnosis not present

## 2016-08-05 DIAGNOSIS — N2581 Secondary hyperparathyroidism of renal origin: Secondary | ICD-10-CM | POA: Diagnosis not present

## 2016-08-05 DIAGNOSIS — D688 Other specified coagulation defects: Secondary | ICD-10-CM | POA: Diagnosis not present

## 2016-08-05 NOTE — Anesthesia Preprocedure Evaluation (Signed)
Anesthesia Evaluation  Patient identified by MRN, date of birth, ID band Patient awake    Reviewed: Allergy & Precautions, NPO status , Patient's Chart, lab work & pertinent test results  Airway Mallampati: II  TM Distance: >3 FB Neck ROM: Full    Dental  (+) Teeth Intact, Dental Advisory Given   Pulmonary shortness of breath,    breath sounds clear to auscultation       Cardiovascular hypertension, + Peripheral Vascular Disease and +CHF   Rhythm:Regular Rate:Normal     Neuro/Psych    GI/Hepatic   Endo/Other  diabetesMorbid obesity  Renal/GU ESRF and DialysisRenal disease     Musculoskeletal  (+) Arthritis ,   Abdominal   Peds  Hematology   Anesthesia Other Findings   Reproductive/Obstetrics                             Anesthesia Physical Anesthesia Plan  ASA: III  Anesthesia Plan: MAC   Post-op Pain Management:    Induction: Intravenous  Airway Management Planned: Natural Airway, Nasal Cannula and Simple Face Mask  Additional Equipment: None  Intra-op Plan:   Post-operative Plan:   Informed Consent: I have reviewed the patients History and Physical, chart, labs and discussed the procedure including the risks, benefits and alternatives for the proposed anesthesia with the patient or authorized representative who has indicated his/her understanding and acceptance.   Dental advisory given  Plan Discussed with: CRNA and Surgeon  Anesthesia Plan Comments:         Anesthesia Quick Evaluation

## 2016-08-05 NOTE — Anesthesia Postprocedure Evaluation (Addendum)
Anesthesia Post Note  Patient: Rachel Hobbs  Procedure(s) Performed: Procedure(s) (LRB): LEFT UPPER ARM BASCILIC VEIN TRANSPOSITION, SECOND STAGE (Left)  Patient location during evaluation: PACU Anesthesia Type: MAC Level of consciousness: awake and alert Pain management: pain level controlled Vital Signs Assessment: post-procedure vital signs reviewed and stable Respiratory status: spontaneous breathing, nonlabored ventilation, respiratory function stable and patient connected to nasal cannula oxygen Cardiovascular status: stable and blood pressure returned to baseline Anesthetic complications: no       Last Vitals:  Vitals:   08/04/16 1355 08/04/16 1406  BP: (!) 148/59 (!) 156/66  Pulse: 62 67  Resp: 15 16  Temp:  36.6 C    Last Pain:  Vitals:   08/04/16 1406  TempSrc:   PainSc: 0-No pain                 Iretha Kirley

## 2016-08-07 DIAGNOSIS — D688 Other specified coagulation defects: Secondary | ICD-10-CM | POA: Diagnosis not present

## 2016-08-07 DIAGNOSIS — E1129 Type 2 diabetes mellitus with other diabetic kidney complication: Secondary | ICD-10-CM | POA: Diagnosis not present

## 2016-08-07 DIAGNOSIS — N2581 Secondary hyperparathyroidism of renal origin: Secondary | ICD-10-CM | POA: Diagnosis not present

## 2016-08-07 DIAGNOSIS — Z23 Encounter for immunization: Secondary | ICD-10-CM | POA: Diagnosis not present

## 2016-08-07 DIAGNOSIS — T8249XA Other complication of vascular dialysis catheter, initial encounter: Secondary | ICD-10-CM | POA: Diagnosis not present

## 2016-08-07 DIAGNOSIS — N186 End stage renal disease: Secondary | ICD-10-CM | POA: Diagnosis not present

## 2016-08-07 DIAGNOSIS — D509 Iron deficiency anemia, unspecified: Secondary | ICD-10-CM | POA: Diagnosis not present

## 2016-08-10 DIAGNOSIS — N2581 Secondary hyperparathyroidism of renal origin: Secondary | ICD-10-CM | POA: Diagnosis not present

## 2016-08-10 DIAGNOSIS — T8249XA Other complication of vascular dialysis catheter, initial encounter: Secondary | ICD-10-CM | POA: Diagnosis not present

## 2016-08-10 DIAGNOSIS — D688 Other specified coagulation defects: Secondary | ICD-10-CM | POA: Diagnosis not present

## 2016-08-10 DIAGNOSIS — Z23 Encounter for immunization: Secondary | ICD-10-CM | POA: Diagnosis not present

## 2016-08-10 DIAGNOSIS — N186 End stage renal disease: Secondary | ICD-10-CM | POA: Diagnosis not present

## 2016-08-10 DIAGNOSIS — D509 Iron deficiency anemia, unspecified: Secondary | ICD-10-CM | POA: Diagnosis not present

## 2016-08-10 DIAGNOSIS — E1129 Type 2 diabetes mellitus with other diabetic kidney complication: Secondary | ICD-10-CM | POA: Diagnosis not present

## 2016-08-11 ENCOUNTER — Other Ambulatory Visit: Payer: Self-pay | Admitting: Family Medicine

## 2016-08-12 ENCOUNTER — Ambulatory Visit: Payer: Medicare Other | Admitting: Family Medicine

## 2016-08-12 ENCOUNTER — Telehealth: Payer: Self-pay | Admitting: *Deleted

## 2016-08-12 DIAGNOSIS — N2581 Secondary hyperparathyroidism of renal origin: Secondary | ICD-10-CM | POA: Diagnosis not present

## 2016-08-12 DIAGNOSIS — D688 Other specified coagulation defects: Secondary | ICD-10-CM | POA: Diagnosis not present

## 2016-08-12 DIAGNOSIS — E1129 Type 2 diabetes mellitus with other diabetic kidney complication: Secondary | ICD-10-CM | POA: Diagnosis not present

## 2016-08-12 DIAGNOSIS — T8249XA Other complication of vascular dialysis catheter, initial encounter: Secondary | ICD-10-CM | POA: Diagnosis not present

## 2016-08-12 DIAGNOSIS — N186 End stage renal disease: Secondary | ICD-10-CM | POA: Diagnosis not present

## 2016-08-12 DIAGNOSIS — Z23 Encounter for immunization: Secondary | ICD-10-CM | POA: Diagnosis not present

## 2016-08-12 DIAGNOSIS — D509 Iron deficiency anemia, unspecified: Secondary | ICD-10-CM | POA: Diagnosis not present

## 2016-08-12 NOTE — Telephone Encounter (Signed)
Pt states she didn't know she had an appt 08/13/2016. I spoke with pt and she states her toenails had not grown a lot and would like to reschedule her appt. I attempted to transfer pt but phone stated due technical difficulty, unable to transfer. I told pt I would message scheduler to cancel tomorrow appt and call her to reschedule. Pt states understanding.

## 2016-08-13 ENCOUNTER — Ambulatory Visit: Payer: Medicare Other | Admitting: Podiatry

## 2016-08-14 DIAGNOSIS — D509 Iron deficiency anemia, unspecified: Secondary | ICD-10-CM | POA: Diagnosis not present

## 2016-08-14 DIAGNOSIS — T8249XA Other complication of vascular dialysis catheter, initial encounter: Secondary | ICD-10-CM | POA: Diagnosis not present

## 2016-08-14 DIAGNOSIS — Z23 Encounter for immunization: Secondary | ICD-10-CM | POA: Diagnosis not present

## 2016-08-14 DIAGNOSIS — N2581 Secondary hyperparathyroidism of renal origin: Secondary | ICD-10-CM | POA: Diagnosis not present

## 2016-08-14 DIAGNOSIS — D688 Other specified coagulation defects: Secondary | ICD-10-CM | POA: Diagnosis not present

## 2016-08-14 DIAGNOSIS — E1129 Type 2 diabetes mellitus with other diabetic kidney complication: Secondary | ICD-10-CM | POA: Diagnosis not present

## 2016-08-14 DIAGNOSIS — N186 End stage renal disease: Secondary | ICD-10-CM | POA: Diagnosis not present

## 2016-08-16 DIAGNOSIS — Z992 Dependence on renal dialysis: Secondary | ICD-10-CM | POA: Diagnosis not present

## 2016-08-16 DIAGNOSIS — E1129 Type 2 diabetes mellitus with other diabetic kidney complication: Secondary | ICD-10-CM | POA: Diagnosis not present

## 2016-08-16 DIAGNOSIS — N186 End stage renal disease: Secondary | ICD-10-CM | POA: Diagnosis not present

## 2016-08-17 DIAGNOSIS — N2581 Secondary hyperparathyroidism of renal origin: Secondary | ICD-10-CM | POA: Diagnosis not present

## 2016-08-17 DIAGNOSIS — D688 Other specified coagulation defects: Secondary | ICD-10-CM | POA: Diagnosis not present

## 2016-08-17 DIAGNOSIS — T8249XA Other complication of vascular dialysis catheter, initial encounter: Secondary | ICD-10-CM | POA: Diagnosis not present

## 2016-08-17 DIAGNOSIS — N186 End stage renal disease: Secondary | ICD-10-CM | POA: Diagnosis not present

## 2016-08-17 DIAGNOSIS — D509 Iron deficiency anemia, unspecified: Secondary | ICD-10-CM | POA: Diagnosis not present

## 2016-08-17 DIAGNOSIS — R51 Headache: Secondary | ICD-10-CM | POA: Diagnosis not present

## 2016-08-17 DIAGNOSIS — E1129 Type 2 diabetes mellitus with other diabetic kidney complication: Secondary | ICD-10-CM | POA: Diagnosis not present

## 2016-08-19 DIAGNOSIS — T8249XA Other complication of vascular dialysis catheter, initial encounter: Secondary | ICD-10-CM | POA: Diagnosis not present

## 2016-08-19 DIAGNOSIS — N186 End stage renal disease: Secondary | ICD-10-CM | POA: Diagnosis not present

## 2016-08-19 DIAGNOSIS — E1129 Type 2 diabetes mellitus with other diabetic kidney complication: Secondary | ICD-10-CM | POA: Diagnosis not present

## 2016-08-19 DIAGNOSIS — D688 Other specified coagulation defects: Secondary | ICD-10-CM | POA: Diagnosis not present

## 2016-08-19 DIAGNOSIS — D509 Iron deficiency anemia, unspecified: Secondary | ICD-10-CM | POA: Diagnosis not present

## 2016-08-19 DIAGNOSIS — R51 Headache: Secondary | ICD-10-CM | POA: Diagnosis not present

## 2016-08-19 DIAGNOSIS — N2581 Secondary hyperparathyroidism of renal origin: Secondary | ICD-10-CM | POA: Diagnosis not present

## 2016-08-21 DIAGNOSIS — R51 Headache: Secondary | ICD-10-CM | POA: Diagnosis not present

## 2016-08-21 DIAGNOSIS — T8249XA Other complication of vascular dialysis catheter, initial encounter: Secondary | ICD-10-CM | POA: Diagnosis not present

## 2016-08-21 DIAGNOSIS — N2581 Secondary hyperparathyroidism of renal origin: Secondary | ICD-10-CM | POA: Diagnosis not present

## 2016-08-21 DIAGNOSIS — E1129 Type 2 diabetes mellitus with other diabetic kidney complication: Secondary | ICD-10-CM | POA: Diagnosis not present

## 2016-08-21 DIAGNOSIS — D688 Other specified coagulation defects: Secondary | ICD-10-CM | POA: Diagnosis not present

## 2016-08-21 DIAGNOSIS — N186 End stage renal disease: Secondary | ICD-10-CM | POA: Diagnosis not present

## 2016-08-21 DIAGNOSIS — D509 Iron deficiency anemia, unspecified: Secondary | ICD-10-CM | POA: Diagnosis not present

## 2016-08-23 ENCOUNTER — Ambulatory Visit (INDEPENDENT_AMBULATORY_CARE_PROVIDER_SITE_OTHER): Payer: Medicare Other | Admitting: Family Medicine

## 2016-08-23 ENCOUNTER — Encounter: Payer: Self-pay | Admitting: Family Medicine

## 2016-08-23 VITALS — BP 125/65 | HR 87 | Temp 97.7°F | Ht 62.0 in | Wt 225.8 lb

## 2016-08-23 DIAGNOSIS — Z0000 Encounter for general adult medical examination without abnormal findings: Secondary | ICD-10-CM | POA: Insufficient documentation

## 2016-08-23 DIAGNOSIS — E785 Hyperlipidemia, unspecified: Secondary | ICD-10-CM | POA: Diagnosis not present

## 2016-08-23 DIAGNOSIS — E118 Type 2 diabetes mellitus with unspecified complications: Secondary | ICD-10-CM

## 2016-08-23 DIAGNOSIS — Z794 Long term (current) use of insulin: Secondary | ICD-10-CM | POA: Diagnosis not present

## 2016-08-23 DIAGNOSIS — I10 Essential (primary) hypertension: Secondary | ICD-10-CM | POA: Diagnosis not present

## 2016-08-23 NOTE — Assessment & Plan Note (Signed)
Continue atorvastatin. Check lipid panel today.

## 2016-08-23 NOTE — Assessment & Plan Note (Signed)
A1c 7.7 last month. Patient deferred increasing dose of lantus this morning. Asked patient to check several fasting sugars over the next few weeks. Follow up in 2 months. Consider addition of novlog to current regimen if fasting sugars well controlled and continues to have elevated A1c.

## 2016-08-23 NOTE — Progress Notes (Signed)
    Subjective:  Rachel Hobbs is a 76 y.o. female who presents to the [facility] today with a chief complaint of T2DM.   HPI:  T2DM Currently on lantus 30 units nightly. Tolerating this well without side effects. Fasting sugars usually run in the 90s to 100s. No hypoglycemic symptoms (palpitations, shakiness, etc). No extreme highs or lows.   Hypertension BP Readings from Last 3 Encounters:  08/23/16 125/65  08/04/16 (!) 156/66  07/26/16 132/74   Home BP monitoring-Yes, 130s/60s Compliant with medications-yes, without side effects ROS-Denies any CP, HA, SOB, blurry vision, LE edema, transient weakness, orthopnea, PND.   HLD Currently on atorvastatin 40mg  daily. Tolerating this well without side effects.   HCM Due for pneumonia and DEXA scan.   ROS: Per HPI  PMH: Smoking history reviewed.    Objective:  Physical Exam: BP 125/65 (BP Location: Right Arm, Patient Position: Sitting, Cuff Size: Large)   Pulse 87   Temp 97.7 F (36.5 C) (Oral)   Ht 5\' 2"  (1.575 m)   Wt 225 lb 12.8 oz (102.4 kg)   SpO2 96%   BMI 41.30 kg/m   Gen: NAD, resting comfortably CV: RRR with no murmurs appreciated Pulm: NWOB, CTAB with no crackles, wheezes, or rhonchi GI: Normal bowel sounds present. Soft, Nontender, Nondistended. MSK: Venous stasis dermatitis changes noted in LE bilaterally.  Skin: warm, dry Neuro: grossly normal, moves all extremities Psych: Normal affect and thought content  Assessment/Plan:  DM type 2, controlled, with complication (H[facility]) Z7Q 7.7 last month. Patient deferred increasing dose of lantus this morning. Asked patient to check several fasting sugars over the next few weeks. Follow up in 2 months. Consider addition of novlog to current regimen if fasting sugars well controlled and continues to have elevated A1c.   HLD (hyperlipidemia) Continue atorvastatin. Check lipid panel today.   HYPERTENSION, BENIGN SYSTEMIC At goal. Continue amlodipine.   Healthcare  maintenance Due for pneumonia vaccine and DEXA. Counseled patient on this and she deferred both today.   [name]. Jerline Pain, [place] Resident PGY-3 08/23/2016 11:44 AM

## 2016-08-23 NOTE — Assessment & Plan Note (Signed)
At goal. Continue amlodipine.  

## 2016-08-23 NOTE — Assessment & Plan Note (Signed)
Due for pneumonia vaccine and DEXA. Counseled patient on this and she deferred both today.

## 2016-08-23 NOTE — Patient Instructions (Signed)
No changes today.  We will check blood work today.  Come back in 3 months.  Take care,  Dr Jerline Pain

## 2016-08-24 DIAGNOSIS — N186 End stage renal disease: Secondary | ICD-10-CM | POA: Diagnosis not present

## 2016-08-24 DIAGNOSIS — D688 Other specified coagulation defects: Secondary | ICD-10-CM | POA: Diagnosis not present

## 2016-08-24 DIAGNOSIS — T8249XA Other complication of vascular dialysis catheter, initial encounter: Secondary | ICD-10-CM | POA: Diagnosis not present

## 2016-08-24 DIAGNOSIS — D509 Iron deficiency anemia, unspecified: Secondary | ICD-10-CM | POA: Diagnosis not present

## 2016-08-24 DIAGNOSIS — E1129 Type 2 diabetes mellitus with other diabetic kidney complication: Secondary | ICD-10-CM | POA: Diagnosis not present

## 2016-08-24 DIAGNOSIS — N2581 Secondary hyperparathyroidism of renal origin: Secondary | ICD-10-CM | POA: Diagnosis not present

## 2016-08-24 DIAGNOSIS — R51 Headache: Secondary | ICD-10-CM | POA: Diagnosis not present

## 2016-08-24 LAB — CMP14+EGFR
ALT: 13 IU/L (ref 0–32)
AST: 15 IU/L (ref 0–40)
Albumin/Globulin Ratio: 1.6 (ref 1.2–2.2)
Albumin: 4.2 g/dL (ref 3.5–4.8)
Alkaline Phosphatase: 116 IU/L (ref 39–117)
BUN/Creatinine Ratio: 6 — ABNORMAL LOW (ref 12–28)
BUN: 37 mg/dL — ABNORMAL HIGH (ref 8–27)
Bilirubin Total: 0.3 mg/dL (ref 0.0–1.2)
CALCIUM: 9.6 mg/dL (ref 8.7–10.3)
CO2: 19 mmol/L (ref 18–29)
Chloride: 92 mmol/L — ABNORMAL LOW (ref 96–106)
Creatinine, Ser: 6.68 mg/dL — ABNORMAL HIGH (ref 0.57–1.00)
GFR calc Af Amer: 6 mL/min/{1.73_m2} — ABNORMAL LOW (ref >59)
GFR, EST NON AFRICAN AMERICAN: 6 mL/min/{1.73_m2} — AB (ref >59)
Globulin, Total: 2.6 g/dL (ref 1.5–4.5)
Glucose: 188 mg/dL — ABNORMAL HIGH (ref 65–99)
Potassium: 4.5 mmol/L (ref 3.5–5.2)
Sodium: 137 mmol/L (ref 134–144)
Total Protein: 6.8 g/dL (ref 6.0–8.5)

## 2016-08-24 LAB — CBC
Hematocrit: 37.1 % (ref 34.0–46.6)
Hemoglobin: 12.2 g/dL (ref 11.1–15.9)
MCH: 31.3 pg (ref 26.6–33.0)
MCHC: 32.9 g/dL (ref 31.5–35.7)
MCV: 95 fL (ref 79–97)
PLATELETS: 230 10*3/uL (ref 150–379)
RBC: 3.9 x10E6/uL (ref 3.77–5.28)
RDW: 15.9 % — AB (ref 12.3–15.4)
WBC: 5.7 10*3/uL (ref 3.4–10.8)

## 2016-08-24 LAB — LIPID PANEL
CHOL/HDL RATIO: 2.4 ratio (ref 0.0–4.4)
Cholesterol, Total: 132 mg/dL (ref 100–199)
HDL: 54 mg/dL (ref >39)
LDL CALC: 51 mg/dL (ref 0–99)
TRIGLYCERIDES: 134 mg/dL (ref 0–149)
VLDL Cholesterol Cal: 27 mg/dL (ref 5–40)

## 2016-08-25 ENCOUNTER — Encounter: Payer: Self-pay | Admitting: Family Medicine

## 2016-08-26 DIAGNOSIS — E1129 Type 2 diabetes mellitus with other diabetic kidney complication: Secondary | ICD-10-CM | POA: Diagnosis not present

## 2016-08-26 DIAGNOSIS — D509 Iron deficiency anemia, unspecified: Secondary | ICD-10-CM | POA: Diagnosis not present

## 2016-08-26 DIAGNOSIS — T8249XA Other complication of vascular dialysis catheter, initial encounter: Secondary | ICD-10-CM | POA: Diagnosis not present

## 2016-08-26 DIAGNOSIS — N2581 Secondary hyperparathyroidism of renal origin: Secondary | ICD-10-CM | POA: Diagnosis not present

## 2016-08-26 DIAGNOSIS — N186 End stage renal disease: Secondary | ICD-10-CM | POA: Diagnosis not present

## 2016-08-26 DIAGNOSIS — D688 Other specified coagulation defects: Secondary | ICD-10-CM | POA: Diagnosis not present

## 2016-08-26 DIAGNOSIS — R51 Headache: Secondary | ICD-10-CM | POA: Diagnosis not present

## 2016-08-28 DIAGNOSIS — N2581 Secondary hyperparathyroidism of renal origin: Secondary | ICD-10-CM | POA: Diagnosis not present

## 2016-08-28 DIAGNOSIS — R51 Headache: Secondary | ICD-10-CM | POA: Diagnosis not present

## 2016-08-28 DIAGNOSIS — D509 Iron deficiency anemia, unspecified: Secondary | ICD-10-CM | POA: Diagnosis not present

## 2016-08-28 DIAGNOSIS — N186 End stage renal disease: Secondary | ICD-10-CM | POA: Diagnosis not present

## 2016-08-28 DIAGNOSIS — D688 Other specified coagulation defects: Secondary | ICD-10-CM | POA: Diagnosis not present

## 2016-08-28 DIAGNOSIS — T8249XA Other complication of vascular dialysis catheter, initial encounter: Secondary | ICD-10-CM | POA: Diagnosis not present

## 2016-08-28 DIAGNOSIS — E1129 Type 2 diabetes mellitus with other diabetic kidney complication: Secondary | ICD-10-CM | POA: Diagnosis not present

## 2016-08-31 ENCOUNTER — Encounter: Payer: Self-pay | Admitting: Surgery

## 2016-08-31 DIAGNOSIS — T8249XA Other complication of vascular dialysis catheter, initial encounter: Secondary | ICD-10-CM | POA: Diagnosis not present

## 2016-08-31 DIAGNOSIS — N2581 Secondary hyperparathyroidism of renal origin: Secondary | ICD-10-CM | POA: Diagnosis not present

## 2016-08-31 DIAGNOSIS — D509 Iron deficiency anemia, unspecified: Secondary | ICD-10-CM | POA: Diagnosis not present

## 2016-08-31 DIAGNOSIS — D688 Other specified coagulation defects: Secondary | ICD-10-CM | POA: Diagnosis not present

## 2016-08-31 DIAGNOSIS — E1129 Type 2 diabetes mellitus with other diabetic kidney complication: Secondary | ICD-10-CM | POA: Diagnosis not present

## 2016-08-31 DIAGNOSIS — N186 End stage renal disease: Secondary | ICD-10-CM | POA: Diagnosis not present

## 2016-08-31 DIAGNOSIS — R51 Headache: Secondary | ICD-10-CM | POA: Diagnosis not present

## 2016-09-02 DIAGNOSIS — R51 Headache: Secondary | ICD-10-CM | POA: Diagnosis not present

## 2016-09-02 DIAGNOSIS — N2581 Secondary hyperparathyroidism of renal origin: Secondary | ICD-10-CM | POA: Diagnosis not present

## 2016-09-02 DIAGNOSIS — E1129 Type 2 diabetes mellitus with other diabetic kidney complication: Secondary | ICD-10-CM | POA: Diagnosis not present

## 2016-09-02 DIAGNOSIS — D509 Iron deficiency anemia, unspecified: Secondary | ICD-10-CM | POA: Diagnosis not present

## 2016-09-02 DIAGNOSIS — N186 End stage renal disease: Secondary | ICD-10-CM | POA: Diagnosis not present

## 2016-09-02 DIAGNOSIS — D688 Other specified coagulation defects: Secondary | ICD-10-CM | POA: Diagnosis not present

## 2016-09-02 DIAGNOSIS — T8249XA Other complication of vascular dialysis catheter, initial encounter: Secondary | ICD-10-CM | POA: Diagnosis not present

## 2016-09-04 DIAGNOSIS — D688 Other specified coagulation defects: Secondary | ICD-10-CM | POA: Diagnosis not present

## 2016-09-04 DIAGNOSIS — N186 End stage renal disease: Secondary | ICD-10-CM | POA: Diagnosis not present

## 2016-09-04 DIAGNOSIS — E1129 Type 2 diabetes mellitus with other diabetic kidney complication: Secondary | ICD-10-CM | POA: Diagnosis not present

## 2016-09-04 DIAGNOSIS — N2581 Secondary hyperparathyroidism of renal origin: Secondary | ICD-10-CM | POA: Diagnosis not present

## 2016-09-04 DIAGNOSIS — R51 Headache: Secondary | ICD-10-CM | POA: Diagnosis not present

## 2016-09-04 DIAGNOSIS — D509 Iron deficiency anemia, unspecified: Secondary | ICD-10-CM | POA: Diagnosis not present

## 2016-09-04 DIAGNOSIS — T8249XA Other complication of vascular dialysis catheter, initial encounter: Secondary | ICD-10-CM | POA: Diagnosis not present

## 2016-09-06 ENCOUNTER — Ambulatory Visit (INDEPENDENT_AMBULATORY_CARE_PROVIDER_SITE_OTHER): Payer: Self-pay | Admitting: Physician Assistant

## 2016-09-06 VITALS — BP 141/78 | HR 81 | Temp 98.1°F | Resp 18 | Ht 62.0 in | Wt 222.0 lb

## 2016-09-06 DIAGNOSIS — N186 End stage renal disease: Secondary | ICD-10-CM

## 2016-09-06 DIAGNOSIS — Z992 Dependence on renal dialysis: Secondary | ICD-10-CM

## 2016-09-06 NOTE — Progress Notes (Signed)
POST OPERATIVE OFFICE NOTE    CC:  F/u for surgery  HPI:  Rachel Hobbs is a 76 y.o. female who is status post first stage left basilic vein transposition on 06/04/2016.  Now she is here for f/u s/p second stage basilic transposition.  She describes numbness in the left hand on/off.  No weakness or skin discoloration changes.  She admits she is not using her left arm and hand much since surgery and it is still slightly painful/discomfortable.  She has a history of a failed left brachiocephalic fistula on 12/75/1700.   No change is Medical history of medication since she was last seen.    Allergies  Allergen Reactions  . No Known Allergies     Current Outpatient Prescriptions  Medication Sig Dispense Refill  . acetaminophen (TYLENOL) 500 MG tablet Take 1,000 mg by mouth every 6 (six) hours as needed (pain).     Marland Kitchen amLODipine (NORVASC) 10 MG tablet Take 0.5 tablets (5 mg total) by mouth every evening.    Marland Kitchen aspirin 81 MG tablet Take 81 mg by mouth daily.      Marland Kitchen atorvastatin (LIPITOR) 40 MG tablet TAKE 1 TABLET BY MOUTH EVERY DAY FOR CHOLESTEROL AT NIGHT 90 tablet 0  . Blood Glucose Monitoring Suppl (BLOOD GLUCOSE METER) kit Use as instructed 1 each 0  . calcium acetate (PHOSLO) 667 MG capsule Take 667 mg by mouth 2 (two) times daily with a meal.     . Insulin Glargine (LANTUS SOLOSTAR) 100 UNIT/ML Solostar Pen INJECT 30 UNITS INTO THE SKIN EVERY NIGHT AFTER SUPPER.    . Insulin Pen Needle 31G X 8 MM MISC BD UltraFine III Pen Needles. For use with insulin pen device. Inject insulin 6 x daily 100 each 3  . isosorbide mononitrate (IMDUR) 30 MG 24 hr tablet Take 30 mg by mouth every evening.     . Lancets (ONETOUCH ULTRASOFT) lancets Once daily testing plus prn for hypoglycemia 100 each 9  . Multiple Vitamin (MULTIVITAMIN WITH MINERALS) TABS tablet Take 1 tablet by mouth daily.    Marland Kitchen NEEDLE, DISP, 30 G (B-D DISP NEEDLE 30GX1") 30G X 1" MISC 1 each by Does not apply route daily. 100 each 2  . ONE  TOUCH ULTRA TEST test strip TEST BLOOD SUGAR EVERY DAY AND AS NEEDED FOR SYMPTOMS OF HYPOGLYCEMIA 100 each 0  . senna (SENOKOT) 8.6 MG tablet Take 1 tablet by mouth daily as needed for constipation.    . triamcinolone cream (KENALOG) 0.1 % Apply 1 application topically 2 (two) times daily as needed. (Patient taking differently: Apply 1 application topically 2 (two) times daily as needed (itching). ) 30 g 2  . oxyCODONE-acetaminophen (ROXICET) 5-325 MG tablet Take 1 tablet by mouth every 6 (six) hours as needed for severe pain. (Patient not taking: Reported on 09/06/2016) 30 tablet 0   No current facility-administered medications for this visit.      ROS:  See HPI  Physical Exam:  Vitals:   09/06/16 1258  BP: (!) 141/78  Pulse: 81  Resp: 18  Temp: 98.1 F (36.7 C)    Incision:  Well healed Extremities:  Grip on Left hand 5/5, sensation intact, Palpable thrill in fistula.  Assessment/Plan:  [name] who is s/p: Creation with transposition of left basilic fistula.   -The fistula can now be accessed for HD.  She will f/u in the future PRN.     COLLINS, EMMA [facility] PA-C [facility] [phone]  Clinic MD:  Trula Slade

## 2016-09-07 DIAGNOSIS — N186 End stage renal disease: Secondary | ICD-10-CM | POA: Diagnosis not present

## 2016-09-07 DIAGNOSIS — R51 Headache: Secondary | ICD-10-CM | POA: Diagnosis not present

## 2016-09-07 DIAGNOSIS — E1129 Type 2 diabetes mellitus with other diabetic kidney complication: Secondary | ICD-10-CM | POA: Diagnosis not present

## 2016-09-07 DIAGNOSIS — D509 Iron deficiency anemia, unspecified: Secondary | ICD-10-CM | POA: Diagnosis not present

## 2016-09-07 DIAGNOSIS — D688 Other specified coagulation defects: Secondary | ICD-10-CM | POA: Diagnosis not present

## 2016-09-07 DIAGNOSIS — T8249XA Other complication of vascular dialysis catheter, initial encounter: Secondary | ICD-10-CM | POA: Diagnosis not present

## 2016-09-07 DIAGNOSIS — N2581 Secondary hyperparathyroidism of renal origin: Secondary | ICD-10-CM | POA: Diagnosis not present

## 2016-09-09 DIAGNOSIS — T8249XA Other complication of vascular dialysis catheter, initial encounter: Secondary | ICD-10-CM | POA: Diagnosis not present

## 2016-09-09 DIAGNOSIS — D509 Iron deficiency anemia, unspecified: Secondary | ICD-10-CM | POA: Diagnosis not present

## 2016-09-09 DIAGNOSIS — D688 Other specified coagulation defects: Secondary | ICD-10-CM | POA: Diagnosis not present

## 2016-09-09 DIAGNOSIS — R51 Headache: Secondary | ICD-10-CM | POA: Diagnosis not present

## 2016-09-09 DIAGNOSIS — N2581 Secondary hyperparathyroidism of renal origin: Secondary | ICD-10-CM | POA: Diagnosis not present

## 2016-09-09 DIAGNOSIS — N186 End stage renal disease: Secondary | ICD-10-CM | POA: Diagnosis not present

## 2016-09-09 DIAGNOSIS — E1129 Type 2 diabetes mellitus with other diabetic kidney complication: Secondary | ICD-10-CM | POA: Diagnosis not present

## 2016-09-11 DIAGNOSIS — N186 End stage renal disease: Secondary | ICD-10-CM | POA: Diagnosis not present

## 2016-09-11 DIAGNOSIS — T8249XA Other complication of vascular dialysis catheter, initial encounter: Secondary | ICD-10-CM | POA: Diagnosis not present

## 2016-09-11 DIAGNOSIS — D509 Iron deficiency anemia, unspecified: Secondary | ICD-10-CM | POA: Diagnosis not present

## 2016-09-11 DIAGNOSIS — R51 Headache: Secondary | ICD-10-CM | POA: Diagnosis not present

## 2016-09-11 DIAGNOSIS — N2581 Secondary hyperparathyroidism of renal origin: Secondary | ICD-10-CM | POA: Diagnosis not present

## 2016-09-11 DIAGNOSIS — E1129 Type 2 diabetes mellitus with other diabetic kidney complication: Secondary | ICD-10-CM | POA: Diagnosis not present

## 2016-09-11 DIAGNOSIS — D688 Other specified coagulation defects: Secondary | ICD-10-CM | POA: Diagnosis not present

## 2016-09-14 DIAGNOSIS — E1129 Type 2 diabetes mellitus with other diabetic kidney complication: Secondary | ICD-10-CM | POA: Diagnosis not present

## 2016-09-14 DIAGNOSIS — D509 Iron deficiency anemia, unspecified: Secondary | ICD-10-CM | POA: Diagnosis not present

## 2016-09-14 DIAGNOSIS — D688 Other specified coagulation defects: Secondary | ICD-10-CM | POA: Diagnosis not present

## 2016-09-14 DIAGNOSIS — R51 Headache: Secondary | ICD-10-CM | POA: Diagnosis not present

## 2016-09-14 DIAGNOSIS — N186 End stage renal disease: Secondary | ICD-10-CM | POA: Diagnosis not present

## 2016-09-14 DIAGNOSIS — N2581 Secondary hyperparathyroidism of renal origin: Secondary | ICD-10-CM | POA: Diagnosis not present

## 2016-09-14 DIAGNOSIS — T8249XA Other complication of vascular dialysis catheter, initial encounter: Secondary | ICD-10-CM | POA: Diagnosis not present

## 2016-09-16 DIAGNOSIS — N2581 Secondary hyperparathyroidism of renal origin: Secondary | ICD-10-CM | POA: Diagnosis not present

## 2016-09-16 DIAGNOSIS — E1129 Type 2 diabetes mellitus with other diabetic kidney complication: Secondary | ICD-10-CM | POA: Diagnosis not present

## 2016-09-16 DIAGNOSIS — R51 Headache: Secondary | ICD-10-CM | POA: Diagnosis not present

## 2016-09-16 DIAGNOSIS — D688 Other specified coagulation defects: Secondary | ICD-10-CM | POA: Diagnosis not present

## 2016-09-16 DIAGNOSIS — N186 End stage renal disease: Secondary | ICD-10-CM | POA: Diagnosis not present

## 2016-09-16 DIAGNOSIS — D509 Iron deficiency anemia, unspecified: Secondary | ICD-10-CM | POA: Diagnosis not present

## 2016-09-16 DIAGNOSIS — Z992 Dependence on renal dialysis: Secondary | ICD-10-CM | POA: Diagnosis not present

## 2016-09-16 DIAGNOSIS — T8249XA Other complication of vascular dialysis catheter, initial encounter: Secondary | ICD-10-CM | POA: Diagnosis not present

## 2016-09-17 ENCOUNTER — Encounter: Payer: Self-pay | Admitting: Podiatry

## 2016-09-17 ENCOUNTER — Ambulatory Visit (INDEPENDENT_AMBULATORY_CARE_PROVIDER_SITE_OTHER): Payer: Medicare Other | Admitting: Podiatry

## 2016-09-17 DIAGNOSIS — B351 Tinea unguium: Secondary | ICD-10-CM | POA: Diagnosis not present

## 2016-09-17 DIAGNOSIS — E1151 Type 2 diabetes mellitus with diabetic peripheral angiopathy without gangrene: Secondary | ICD-10-CM

## 2016-09-17 DIAGNOSIS — M79609 Pain in unspecified limb: Secondary | ICD-10-CM | POA: Diagnosis not present

## 2016-09-17 DIAGNOSIS — Z794 Long term (current) use of insulin: Secondary | ICD-10-CM

## 2016-09-17 NOTE — Progress Notes (Signed)
Patient ID: Rachel Hobbs, female   DOB: Nov 30, 1940, 76 y.o.   MRN: [id number] HPI  Complaint:  Visit Type: Patient returns to my office for continued preventative foot care services. Complaint: Patient states" my nails have grown long and thick and become painful to walk and wear shoes" Patient has been diagnosed with DM . This patient  presents for preventative foot care services. No changes to ROS  Podiatric Exam: Vascular: dorsalis pedis and posterior tibial pulses are non palpable due to swelling both feet.. Capillary return is slow to refill. Temperature gradient is negative. Skin turgor WNL, bilateral swelling  Sensorium: Diminished  Semmes Weinstein monofilament test. Normal tactile sensation bilaterally.  Nail Exam: Pt has thick disfigured discolored nails with subungual debris noted bilateral entire nail hallux through fifth toenails Ulcer Exam: There is no evidence of ulcer or pre-ulcerative changes or infection. Orthopedic Exam: Muscle tone and strength are WNL. No limitations in general ROM. No crepitus or effusions noted. Foot type and digits show no abnormalities.  HAV  B/L Skin: No Porokeratosis. No infection or ulcers  Diagnosis:  Onychomycosis, Pain in right toe, pain in left toes  Treatment & Plan Procedures and Treatment: Consent by patient was obtained for treatment procedures. The patient understood the discussion of treatment and procedures well. All questions were answered thoroughly reviewed. Debridement of mycotic and hypertrophic toenails, 1 through 5 bilateral and clearing of subungual debris. No ulceration, no infection noted.  Return Visit-Office Procedure: Patient instructed to return to the office for a follow up visit 3 months for continued evaluation and treatment.   Gardiner Barefoot DPM

## 2016-09-17 NOTE — Addendum Note (Signed)
Addendum  created 09/17/16 1138 by Rica Koyanagi, MD   Sign clinical note

## 2016-09-18 DIAGNOSIS — R52 Pain, unspecified: Secondary | ICD-10-CM | POA: Diagnosis not present

## 2016-09-18 DIAGNOSIS — T8249XA Other complication of vascular dialysis catheter, initial encounter: Secondary | ICD-10-CM | POA: Diagnosis not present

## 2016-09-18 DIAGNOSIS — D509 Iron deficiency anemia, unspecified: Secondary | ICD-10-CM | POA: Diagnosis not present

## 2016-09-18 DIAGNOSIS — R739 Hyperglycemia, unspecified: Secondary | ICD-10-CM | POA: Diagnosis not present

## 2016-09-18 DIAGNOSIS — E1129 Type 2 diabetes mellitus with other diabetic kidney complication: Secondary | ICD-10-CM | POA: Diagnosis not present

## 2016-09-18 DIAGNOSIS — N186 End stage renal disease: Secondary | ICD-10-CM | POA: Diagnosis not present

## 2016-09-18 DIAGNOSIS — R51 Headache: Secondary | ICD-10-CM | POA: Diagnosis not present

## 2016-09-18 DIAGNOSIS — D631 Anemia in chronic kidney disease: Secondary | ICD-10-CM | POA: Diagnosis not present

## 2016-09-18 DIAGNOSIS — N2581 Secondary hyperparathyroidism of renal origin: Secondary | ICD-10-CM | POA: Diagnosis not present

## 2016-09-18 DIAGNOSIS — D688 Other specified coagulation defects: Secondary | ICD-10-CM | POA: Diagnosis not present

## 2016-09-20 NOTE — Addendum Note (Signed)
Addendum  created 09/20/16 1404 by [name], MD   Sign clinical note

## 2016-09-21 DIAGNOSIS — R739 Hyperglycemia, unspecified: Secondary | ICD-10-CM | POA: Diagnosis not present

## 2016-09-21 DIAGNOSIS — T8249XA Other complication of vascular dialysis catheter, initial encounter: Secondary | ICD-10-CM | POA: Diagnosis not present

## 2016-09-21 DIAGNOSIS — D688 Other specified coagulation defects: Secondary | ICD-10-CM | POA: Diagnosis not present

## 2016-09-21 DIAGNOSIS — D631 Anemia in chronic kidney disease: Secondary | ICD-10-CM | POA: Diagnosis not present

## 2016-09-21 DIAGNOSIS — N186 End stage renal disease: Secondary | ICD-10-CM | POA: Diagnosis not present

## 2016-09-21 DIAGNOSIS — E1129 Type 2 diabetes mellitus with other diabetic kidney complication: Secondary | ICD-10-CM | POA: Diagnosis not present

## 2016-09-21 DIAGNOSIS — N2581 Secondary hyperparathyroidism of renal origin: Secondary | ICD-10-CM | POA: Diagnosis not present

## 2016-09-21 DIAGNOSIS — R52 Pain, unspecified: Secondary | ICD-10-CM | POA: Diagnosis not present

## 2016-09-21 DIAGNOSIS — D509 Iron deficiency anemia, unspecified: Secondary | ICD-10-CM | POA: Diagnosis not present

## 2016-09-21 DIAGNOSIS — R51 Headache: Secondary | ICD-10-CM | POA: Diagnosis not present

## 2016-09-22 DIAGNOSIS — R52 Pain, unspecified: Secondary | ICD-10-CM | POA: Insufficient documentation

## 2016-09-23 ENCOUNTER — Other Ambulatory Visit: Payer: Self-pay

## 2016-09-23 DIAGNOSIS — D509 Iron deficiency anemia, unspecified: Secondary | ICD-10-CM | POA: Diagnosis not present

## 2016-09-23 DIAGNOSIS — T8249XA Other complication of vascular dialysis catheter, initial encounter: Secondary | ICD-10-CM | POA: Diagnosis not present

## 2016-09-23 DIAGNOSIS — N186 End stage renal disease: Secondary | ICD-10-CM

## 2016-09-23 DIAGNOSIS — D631 Anemia in chronic kidney disease: Secondary | ICD-10-CM | POA: Diagnosis not present

## 2016-09-23 DIAGNOSIS — Z992 Dependence on renal dialysis: Secondary | ICD-10-CM

## 2016-09-23 DIAGNOSIS — Z48812 Encounter for surgical aftercare following surgery on the circulatory system: Secondary | ICD-10-CM

## 2016-09-23 DIAGNOSIS — E1129 Type 2 diabetes mellitus with other diabetic kidney complication: Secondary | ICD-10-CM | POA: Diagnosis not present

## 2016-09-23 DIAGNOSIS — R739 Hyperglycemia, unspecified: Secondary | ICD-10-CM | POA: Diagnosis not present

## 2016-09-23 DIAGNOSIS — M79602 Pain in left arm: Secondary | ICD-10-CM

## 2016-09-23 DIAGNOSIS — D688 Other specified coagulation defects: Secondary | ICD-10-CM | POA: Diagnosis not present

## 2016-09-23 DIAGNOSIS — R52 Pain, unspecified: Secondary | ICD-10-CM | POA: Diagnosis not present

## 2016-09-23 DIAGNOSIS — R51 Headache: Secondary | ICD-10-CM | POA: Diagnosis not present

## 2016-09-23 DIAGNOSIS — N2581 Secondary hyperparathyroidism of renal origin: Secondary | ICD-10-CM | POA: Diagnosis not present

## 2016-09-24 ENCOUNTER — Telehealth: Payer: Self-pay | Admitting: Surgery

## 2016-09-24 NOTE — Telephone Encounter (Signed)
-----   Message from Denman George, RN sent at 09/23/2016 10:20 AM EDT ----- Regarding: FW: question of booking fistulogram vs office exam Please schedule this pt. for left arm access duplex and left arm steal study and office visit with Dr. Trula Slade.; c/o pain in left arm access.  Can we get her scheduled within next 2 weeks?  She dialyzes [facility] at [facility]; please contact Melissa @ [phone] with appt. Information. Thanks.   ----- Message ----- From: [name], MD Sent: 09/22/2016   9:20 PM To: Denman George, RN Subject: RE: question of booking fistulogram vs offic#  Bring her into the office with duplex and steal evaluation ----- Message ----- From: Denman George, RN Sent: 09/22/2016   5:16 PM To: [name], MD Subject: question of booking fistulogram vs office ex#  S/p left 2nd Stage BVT of 4/18.  [facility] reported she is c/o a lot of pain in the access, and they're requesting an appt.  Should I bring her to the office with an access duplex, or do a fistulogram?

## 2016-09-24 NOTE — Telephone Encounter (Signed)
Sched lab 09/29/16 at 12:30 and MD 10/04/16 at 3:00. Lm on hm# for pt to confirm appts.

## 2016-09-25 DIAGNOSIS — D688 Other specified coagulation defects: Secondary | ICD-10-CM | POA: Diagnosis not present

## 2016-09-25 DIAGNOSIS — E1129 Type 2 diabetes mellitus with other diabetic kidney complication: Secondary | ICD-10-CM | POA: Diagnosis not present

## 2016-09-25 DIAGNOSIS — D509 Iron deficiency anemia, unspecified: Secondary | ICD-10-CM | POA: Diagnosis not present

## 2016-09-25 DIAGNOSIS — T8249XA Other complication of vascular dialysis catheter, initial encounter: Secondary | ICD-10-CM | POA: Diagnosis not present

## 2016-09-25 DIAGNOSIS — N2581 Secondary hyperparathyroidism of renal origin: Secondary | ICD-10-CM | POA: Diagnosis not present

## 2016-09-25 DIAGNOSIS — N186 End stage renal disease: Secondary | ICD-10-CM | POA: Diagnosis not present

## 2016-09-25 DIAGNOSIS — R52 Pain, unspecified: Secondary | ICD-10-CM | POA: Diagnosis not present

## 2016-09-25 DIAGNOSIS — D631 Anemia in chronic kidney disease: Secondary | ICD-10-CM | POA: Diagnosis not present

## 2016-09-25 DIAGNOSIS — R51 Headache: Secondary | ICD-10-CM | POA: Diagnosis not present

## 2016-09-25 DIAGNOSIS — R739 Hyperglycemia, unspecified: Secondary | ICD-10-CM | POA: Diagnosis not present

## 2016-09-27 ENCOUNTER — Encounter: Payer: Self-pay | Admitting: Surgery

## 2016-09-28 DIAGNOSIS — N2581 Secondary hyperparathyroidism of renal origin: Secondary | ICD-10-CM | POA: Diagnosis not present

## 2016-09-28 DIAGNOSIS — R739 Hyperglycemia, unspecified: Secondary | ICD-10-CM | POA: Diagnosis not present

## 2016-09-28 DIAGNOSIS — R51 Headache: Secondary | ICD-10-CM | POA: Diagnosis not present

## 2016-09-28 DIAGNOSIS — T8249XA Other complication of vascular dialysis catheter, initial encounter: Secondary | ICD-10-CM | POA: Diagnosis not present

## 2016-09-28 DIAGNOSIS — N186 End stage renal disease: Secondary | ICD-10-CM | POA: Diagnosis not present

## 2016-09-28 DIAGNOSIS — D509 Iron deficiency anemia, unspecified: Secondary | ICD-10-CM | POA: Diagnosis not present

## 2016-09-28 DIAGNOSIS — D688 Other specified coagulation defects: Secondary | ICD-10-CM | POA: Diagnosis not present

## 2016-09-28 DIAGNOSIS — D631 Anemia in chronic kidney disease: Secondary | ICD-10-CM | POA: Diagnosis not present

## 2016-09-28 DIAGNOSIS — R52 Pain, unspecified: Secondary | ICD-10-CM | POA: Diagnosis not present

## 2016-09-28 DIAGNOSIS — E1129 Type 2 diabetes mellitus with other diabetic kidney complication: Secondary | ICD-10-CM | POA: Diagnosis not present

## 2016-09-29 ENCOUNTER — Other Ambulatory Visit (HOSPITAL_COMMUNITY): Payer: Medicare Other

## 2016-09-29 ENCOUNTER — Ambulatory Visit (HOSPITAL_COMMUNITY)
Admission: RE | Admit: 2016-09-29 | Discharge: 2016-09-29 | Disposition: A | Discharge status: Home / Self Care | Payer: Medicare Other | Source: Ambulatory Visit | Attending: Surgery | Admitting: Surgery

## 2016-09-29 DIAGNOSIS — N186 End stage renal disease: Secondary | ICD-10-CM | POA: Diagnosis not present

## 2016-09-29 DIAGNOSIS — I70208 Unspecified atherosclerosis of native arteries of extremities, other extremity: Secondary | ICD-10-CM | POA: Insufficient documentation

## 2016-09-29 DIAGNOSIS — Z48812 Encounter for surgical aftercare following surgery on the circulatory system: Secondary | ICD-10-CM

## 2016-09-29 DIAGNOSIS — Z992 Dependence on renal dialysis: Secondary | ICD-10-CM | POA: Diagnosis not present

## 2016-09-29 DIAGNOSIS — M79602 Pain in left arm: Secondary | ICD-10-CM

## 2016-09-30 ENCOUNTER — Other Ambulatory Visit (HOSPITAL_COMMUNITY): Payer: Medicare Other

## 2016-09-30 ENCOUNTER — Encounter (HOSPITAL_COMMUNITY): Payer: Medicare Other

## 2016-09-30 DIAGNOSIS — E1129 Type 2 diabetes mellitus with other diabetic kidney complication: Secondary | ICD-10-CM | POA: Diagnosis not present

## 2016-09-30 DIAGNOSIS — D631 Anemia in chronic kidney disease: Secondary | ICD-10-CM | POA: Diagnosis not present

## 2016-09-30 DIAGNOSIS — R52 Pain, unspecified: Secondary | ICD-10-CM | POA: Diagnosis not present

## 2016-09-30 DIAGNOSIS — R51 Headache: Secondary | ICD-10-CM | POA: Diagnosis not present

## 2016-09-30 DIAGNOSIS — N2581 Secondary hyperparathyroidism of renal origin: Secondary | ICD-10-CM | POA: Diagnosis not present

## 2016-09-30 DIAGNOSIS — N186 End stage renal disease: Secondary | ICD-10-CM | POA: Diagnosis not present

## 2016-09-30 DIAGNOSIS — D509 Iron deficiency anemia, unspecified: Secondary | ICD-10-CM | POA: Diagnosis not present

## 2016-09-30 DIAGNOSIS — D688 Other specified coagulation defects: Secondary | ICD-10-CM | POA: Diagnosis not present

## 2016-09-30 DIAGNOSIS — R739 Hyperglycemia, unspecified: Secondary | ICD-10-CM | POA: Diagnosis not present

## 2016-09-30 DIAGNOSIS — T8249XA Other complication of vascular dialysis catheter, initial encounter: Secondary | ICD-10-CM | POA: Diagnosis not present

## 2016-10-02 DIAGNOSIS — R51 Headache: Secondary | ICD-10-CM | POA: Diagnosis not present

## 2016-10-02 DIAGNOSIS — D688 Other specified coagulation defects: Secondary | ICD-10-CM | POA: Diagnosis not present

## 2016-10-02 DIAGNOSIS — N186 End stage renal disease: Secondary | ICD-10-CM | POA: Diagnosis not present

## 2016-10-02 DIAGNOSIS — R52 Pain, unspecified: Secondary | ICD-10-CM | POA: Diagnosis not present

## 2016-10-02 DIAGNOSIS — N2581 Secondary hyperparathyroidism of renal origin: Secondary | ICD-10-CM | POA: Diagnosis not present

## 2016-10-02 DIAGNOSIS — D631 Anemia in chronic kidney disease: Secondary | ICD-10-CM | POA: Diagnosis not present

## 2016-10-02 DIAGNOSIS — T8249XA Other complication of vascular dialysis catheter, initial encounter: Secondary | ICD-10-CM | POA: Diagnosis not present

## 2016-10-02 DIAGNOSIS — R739 Hyperglycemia, unspecified: Secondary | ICD-10-CM | POA: Diagnosis not present

## 2016-10-02 DIAGNOSIS — D509 Iron deficiency anemia, unspecified: Secondary | ICD-10-CM | POA: Diagnosis not present

## 2016-10-02 DIAGNOSIS — E1129 Type 2 diabetes mellitus with other diabetic kidney complication: Secondary | ICD-10-CM | POA: Diagnosis not present

## 2016-10-04 ENCOUNTER — Encounter: Payer: Self-pay | Admitting: Surgery

## 2016-10-04 ENCOUNTER — Ambulatory Visit (INDEPENDENT_AMBULATORY_CARE_PROVIDER_SITE_OTHER): Payer: Self-pay | Admitting: Surgery

## 2016-10-04 VITALS — BP 158/65 | HR 78 | Temp 98.0°F | Resp 20 | Ht 62.0 in | Wt 227.0 lb

## 2016-10-04 DIAGNOSIS — N186 End stage renal disease: Secondary | ICD-10-CM

## 2016-10-04 DIAGNOSIS — Z992 Dependence on renal dialysis: Secondary | ICD-10-CM

## 2016-10-04 NOTE — Progress Notes (Signed)
POST OPERATIVE OFFICE NOTE    CC:  F/u for surgery  HPI:  [name] who is s/p 2nd stage left basilic vein transposition on 08/04/16 by Dr. Trula Hobbs.  She states that they started using her fistula about 2-3 weeks ago.  She states that she had one person stick the fistula with 2 needles and it worked fine.  Then she was stuck and the fistula bled.  She states that they gave the fistula a break and used her catheter.  Over the past week, they have been using one needle in the fistula and it is working fine.  She states that she does get some numbness in her hand even off of dialysis.  She states that she has not lost any use of her hand.  Her sensation is in tact.  Her TDC was placed by Dr. Oneida Hobbs in August 2017.  Allergies  Allergen Reactions  . No Known Allergies     Current Outpatient Prescriptions  Medication Sig Dispense Refill  . acetaminophen (TYLENOL) 500 MG tablet Take 1,000 mg by mouth every 6 (six) hours as needed (pain).     Marland Kitchen amLODipine (NORVASC) 10 MG tablet Take 0.5 tablets (5 mg total) by mouth every evening.    Marland Kitchen aspirin 81 MG tablet Take 81 mg by mouth daily.      Marland Kitchen atorvastatin (LIPITOR) 40 MG tablet TAKE 1 TABLET BY MOUTH EVERY DAY FOR CHOLESTEROL AT NIGHT 90 tablet 0  . Blood Glucose Monitoring Suppl (BLOOD GLUCOSE METER) kit Use as instructed 1 each 0  . calcium acetate (PHOSLO) 667 MG capsule Take 667 mg by mouth 2 (two) times daily with a meal.     . Insulin Glargine (LANTUS SOLOSTAR) 100 UNIT/ML Solostar Pen INJECT 30 UNITS INTO THE SKIN EVERY NIGHT AFTER SUPPER.    . Insulin Pen Needle 31G X 8 MM MISC BD UltraFine III Pen Needles. For use with insulin pen device. Inject insulin 6 x daily 100 each 3  . isosorbide mononitrate (IMDUR) 30 MG 24 hr tablet Take 30 mg by mouth every evening.     . Lancets (ONETOUCH ULTRASOFT) lancets Once daily testing plus prn for hypoglycemia 100 each 9  . Multiple Vitamin (MULTIVITAMIN WITH MINERALS) TABS tablet Take 1  tablet by mouth daily.    Marland Kitchen NEEDLE, DISP, 30 G (B-D DISP NEEDLE 30GX1") 30G X 1" MISC 1 each by Does not apply route daily. 100 each 2  . ONE TOUCH ULTRA TEST test strip TEST BLOOD SUGAR EVERY DAY AND AS NEEDED FOR SYMPTOMS OF HYPOGLYCEMIA 100 each 0  . oxyCODONE-acetaminophen (ROXICET) 5-325 MG tablet Take 1 tablet by mouth every 6 (six) hours as needed for severe pain. 30 tablet 0  . senna (SENOKOT) 8.6 MG tablet Take 1 tablet by mouth daily as needed for constipation.    . triamcinolone cream (KENALOG) 0.1 % Apply 1 application topically 2 (two) times daily as needed. (Patient taking differently: Apply 1 application topically 2 (two) times daily as needed (itching). ) 30 g 2   No current facility-administered medications for this visit.      ROS:  See HPI  Physical Exam:  Vitals:   10/04/16 1513  BP: (!) 158/65  Pulse: 78  Resp: 20  Temp: 98 F (36.7 C)    Incision:  Healed; there is some ecchymosis over the fistula Extremities:  + palpable left radial pulse; +thrill/bruit within the fistula   Dialysis Duplex Evaluation 09/29/16: No change in  the distal radial artery velocity with fistula compression.   Assessment/Plan:  [name] who is s/p: 2nd stage left basilic vein transposition on 08/04/16 by Dr. Trula Hobbs  -on her duplex to evaluate for steal, there was no change in velocity with compression of the fistula.  The numbness in her hand is most likely due to some nerve irritation from surgery and will hopefully get better over time. -the fistula has a good thrill/bruit in it.  Continue using fistula and once it has been used several times without difficulty, we can plan for removal of her catheter.    Rachel Locket, PA-C [facility] [phone]  Clinic MD:  Pt seen and examined with Dr. Trula Hobbs.  I agree with the above.  She will continue to attempt cannulation of the fistula.  If she continues to have difficulty, she will need a  fistulogram.  Rachel Hobbs

## 2016-10-05 DIAGNOSIS — E1129 Type 2 diabetes mellitus with other diabetic kidney complication: Secondary | ICD-10-CM | POA: Diagnosis not present

## 2016-10-05 DIAGNOSIS — T8249XA Other complication of vascular dialysis catheter, initial encounter: Secondary | ICD-10-CM | POA: Diagnosis not present

## 2016-10-05 DIAGNOSIS — D509 Iron deficiency anemia, unspecified: Secondary | ICD-10-CM | POA: Diagnosis not present

## 2016-10-05 DIAGNOSIS — D631 Anemia in chronic kidney disease: Secondary | ICD-10-CM | POA: Diagnosis not present

## 2016-10-05 DIAGNOSIS — R51 Headache: Secondary | ICD-10-CM | POA: Diagnosis not present

## 2016-10-05 DIAGNOSIS — R739 Hyperglycemia, unspecified: Secondary | ICD-10-CM | POA: Diagnosis not present

## 2016-10-05 DIAGNOSIS — D688 Other specified coagulation defects: Secondary | ICD-10-CM | POA: Diagnosis not present

## 2016-10-05 DIAGNOSIS — N2581 Secondary hyperparathyroidism of renal origin: Secondary | ICD-10-CM | POA: Diagnosis not present

## 2016-10-05 DIAGNOSIS — N186 End stage renal disease: Secondary | ICD-10-CM | POA: Diagnosis not present

## 2016-10-05 DIAGNOSIS — R52 Pain, unspecified: Secondary | ICD-10-CM | POA: Diagnosis not present

## 2016-10-07 ENCOUNTER — Other Ambulatory Visit: Payer: Self-pay | Admitting: Family Medicine

## 2016-10-07 DIAGNOSIS — R51 Headache: Secondary | ICD-10-CM | POA: Diagnosis not present

## 2016-10-07 DIAGNOSIS — D509 Iron deficiency anemia, unspecified: Secondary | ICD-10-CM | POA: Diagnosis not present

## 2016-10-07 DIAGNOSIS — R739 Hyperglycemia, unspecified: Secondary | ICD-10-CM | POA: Diagnosis not present

## 2016-10-07 DIAGNOSIS — D631 Anemia in chronic kidney disease: Secondary | ICD-10-CM | POA: Diagnosis not present

## 2016-10-07 DIAGNOSIS — N2581 Secondary hyperparathyroidism of renal origin: Secondary | ICD-10-CM | POA: Diagnosis not present

## 2016-10-07 DIAGNOSIS — R52 Pain, unspecified: Secondary | ICD-10-CM | POA: Diagnosis not present

## 2016-10-07 DIAGNOSIS — D688 Other specified coagulation defects: Secondary | ICD-10-CM | POA: Diagnosis not present

## 2016-10-07 DIAGNOSIS — N186 End stage renal disease: Secondary | ICD-10-CM | POA: Diagnosis not present

## 2016-10-07 DIAGNOSIS — T8249XA Other complication of vascular dialysis catheter, initial encounter: Secondary | ICD-10-CM | POA: Diagnosis not present

## 2016-10-07 DIAGNOSIS — E1129 Type 2 diabetes mellitus with other diabetic kidney complication: Secondary | ICD-10-CM | POA: Diagnosis not present

## 2016-10-09 DIAGNOSIS — E1129 Type 2 diabetes mellitus with other diabetic kidney complication: Secondary | ICD-10-CM | POA: Diagnosis not present

## 2016-10-09 DIAGNOSIS — D631 Anemia in chronic kidney disease: Secondary | ICD-10-CM | POA: Diagnosis not present

## 2016-10-09 DIAGNOSIS — D509 Iron deficiency anemia, unspecified: Secondary | ICD-10-CM | POA: Diagnosis not present

## 2016-10-09 DIAGNOSIS — N2581 Secondary hyperparathyroidism of renal origin: Secondary | ICD-10-CM | POA: Diagnosis not present

## 2016-10-09 DIAGNOSIS — D688 Other specified coagulation defects: Secondary | ICD-10-CM | POA: Diagnosis not present

## 2016-10-09 DIAGNOSIS — R739 Hyperglycemia, unspecified: Secondary | ICD-10-CM | POA: Diagnosis not present

## 2016-10-09 DIAGNOSIS — R51 Headache: Secondary | ICD-10-CM | POA: Diagnosis not present

## 2016-10-09 DIAGNOSIS — N186 End stage renal disease: Secondary | ICD-10-CM | POA: Diagnosis not present

## 2016-10-09 DIAGNOSIS — T8249XA Other complication of vascular dialysis catheter, initial encounter: Secondary | ICD-10-CM | POA: Diagnosis not present

## 2016-10-09 DIAGNOSIS — R52 Pain, unspecified: Secondary | ICD-10-CM | POA: Diagnosis not present

## 2016-10-12 DIAGNOSIS — Z992 Dependence on renal dialysis: Secondary | ICD-10-CM | POA: Diagnosis not present

## 2016-10-12 DIAGNOSIS — D688 Other specified coagulation defects: Secondary | ICD-10-CM | POA: Diagnosis not present

## 2016-10-12 DIAGNOSIS — D631 Anemia in chronic kidney disease: Secondary | ICD-10-CM | POA: Diagnosis not present

## 2016-10-12 DIAGNOSIS — N186 End stage renal disease: Secondary | ICD-10-CM | POA: Diagnosis not present

## 2016-10-12 DIAGNOSIS — R739 Hyperglycemia, unspecified: Secondary | ICD-10-CM | POA: Diagnosis not present

## 2016-10-12 DIAGNOSIS — N2581 Secondary hyperparathyroidism of renal origin: Secondary | ICD-10-CM | POA: Diagnosis not present

## 2016-10-12 DIAGNOSIS — T82868A Thrombosis of vascular prosthetic devices, implants and grafts, initial encounter: Secondary | ICD-10-CM | POA: Diagnosis not present

## 2016-10-12 DIAGNOSIS — I871 Compression of vein: Secondary | ICD-10-CM | POA: Diagnosis not present

## 2016-10-12 DIAGNOSIS — E1129 Type 2 diabetes mellitus with other diabetic kidney complication: Secondary | ICD-10-CM | POA: Diagnosis not present

## 2016-10-12 DIAGNOSIS — R52 Pain, unspecified: Secondary | ICD-10-CM | POA: Diagnosis not present

## 2016-10-12 DIAGNOSIS — D509 Iron deficiency anemia, unspecified: Secondary | ICD-10-CM | POA: Diagnosis not present

## 2016-10-12 DIAGNOSIS — R51 Headache: Secondary | ICD-10-CM | POA: Diagnosis not present

## 2016-10-12 DIAGNOSIS — T8249XA Other complication of vascular dialysis catheter, initial encounter: Secondary | ICD-10-CM | POA: Diagnosis not present

## 2016-10-14 DIAGNOSIS — R52 Pain, unspecified: Secondary | ICD-10-CM | POA: Diagnosis not present

## 2016-10-14 DIAGNOSIS — D631 Anemia in chronic kidney disease: Secondary | ICD-10-CM | POA: Diagnosis not present

## 2016-10-14 DIAGNOSIS — N186 End stage renal disease: Secondary | ICD-10-CM | POA: Diagnosis not present

## 2016-10-14 DIAGNOSIS — N2581 Secondary hyperparathyroidism of renal origin: Secondary | ICD-10-CM | POA: Diagnosis not present

## 2016-10-14 DIAGNOSIS — D688 Other specified coagulation defects: Secondary | ICD-10-CM | POA: Diagnosis not present

## 2016-10-14 DIAGNOSIS — D509 Iron deficiency anemia, unspecified: Secondary | ICD-10-CM | POA: Diagnosis not present

## 2016-10-14 DIAGNOSIS — R739 Hyperglycemia, unspecified: Secondary | ICD-10-CM | POA: Diagnosis not present

## 2016-10-14 DIAGNOSIS — R51 Headache: Secondary | ICD-10-CM | POA: Diagnosis not present

## 2016-10-14 DIAGNOSIS — E1129 Type 2 diabetes mellitus with other diabetic kidney complication: Secondary | ICD-10-CM | POA: Diagnosis not present

## 2016-10-14 DIAGNOSIS — T8249XA Other complication of vascular dialysis catheter, initial encounter: Secondary | ICD-10-CM | POA: Diagnosis not present

## 2016-10-16 DIAGNOSIS — R51 Headache: Secondary | ICD-10-CM | POA: Diagnosis not present

## 2016-10-16 DIAGNOSIS — N186 End stage renal disease: Secondary | ICD-10-CM | POA: Diagnosis not present

## 2016-10-16 DIAGNOSIS — E1129 Type 2 diabetes mellitus with other diabetic kidney complication: Secondary | ICD-10-CM | POA: Diagnosis not present

## 2016-10-16 DIAGNOSIS — D688 Other specified coagulation defects: Secondary | ICD-10-CM | POA: Diagnosis not present

## 2016-10-16 DIAGNOSIS — N2581 Secondary hyperparathyroidism of renal origin: Secondary | ICD-10-CM | POA: Diagnosis not present

## 2016-10-16 DIAGNOSIS — T8249XA Other complication of vascular dialysis catheter, initial encounter: Secondary | ICD-10-CM | POA: Diagnosis not present

## 2016-10-16 DIAGNOSIS — R739 Hyperglycemia, unspecified: Secondary | ICD-10-CM | POA: Diagnosis not present

## 2016-10-16 DIAGNOSIS — R52 Pain, unspecified: Secondary | ICD-10-CM | POA: Diagnosis not present

## 2016-10-16 DIAGNOSIS — D509 Iron deficiency anemia, unspecified: Secondary | ICD-10-CM | POA: Diagnosis not present

## 2016-10-16 DIAGNOSIS — D631 Anemia in chronic kidney disease: Secondary | ICD-10-CM | POA: Diagnosis not present

## 2016-10-16 DIAGNOSIS — Z992 Dependence on renal dialysis: Secondary | ICD-10-CM | POA: Diagnosis not present

## 2016-10-19 DIAGNOSIS — T8249XA Other complication of vascular dialysis catheter, initial encounter: Secondary | ICD-10-CM | POA: Diagnosis not present

## 2016-10-19 DIAGNOSIS — R51 Headache: Secondary | ICD-10-CM | POA: Diagnosis not present

## 2016-10-19 DIAGNOSIS — N186 End stage renal disease: Secondary | ICD-10-CM | POA: Diagnosis not present

## 2016-10-19 DIAGNOSIS — D688 Other specified coagulation defects: Secondary | ICD-10-CM | POA: Diagnosis not present

## 2016-10-19 DIAGNOSIS — D509 Iron deficiency anemia, unspecified: Secondary | ICD-10-CM | POA: Diagnosis not present

## 2016-10-19 DIAGNOSIS — N2581 Secondary hyperparathyroidism of renal origin: Secondary | ICD-10-CM | POA: Diagnosis not present

## 2016-10-19 DIAGNOSIS — E1129 Type 2 diabetes mellitus with other diabetic kidney complication: Secondary | ICD-10-CM | POA: Diagnosis not present

## 2016-10-19 DIAGNOSIS — D631 Anemia in chronic kidney disease: Secondary | ICD-10-CM | POA: Diagnosis not present

## 2016-10-21 DIAGNOSIS — T8249XA Other complication of vascular dialysis catheter, initial encounter: Secondary | ICD-10-CM | POA: Diagnosis not present

## 2016-10-21 DIAGNOSIS — D688 Other specified coagulation defects: Secondary | ICD-10-CM | POA: Diagnosis not present

## 2016-10-21 DIAGNOSIS — E1129 Type 2 diabetes mellitus with other diabetic kidney complication: Secondary | ICD-10-CM | POA: Diagnosis not present

## 2016-10-21 DIAGNOSIS — D631 Anemia in chronic kidney disease: Secondary | ICD-10-CM | POA: Diagnosis not present

## 2016-10-21 DIAGNOSIS — R51 Headache: Secondary | ICD-10-CM | POA: Diagnosis not present

## 2016-10-21 DIAGNOSIS — D509 Iron deficiency anemia, unspecified: Secondary | ICD-10-CM | POA: Diagnosis not present

## 2016-10-21 DIAGNOSIS — N2581 Secondary hyperparathyroidism of renal origin: Secondary | ICD-10-CM | POA: Diagnosis not present

## 2016-10-21 DIAGNOSIS — N186 End stage renal disease: Secondary | ICD-10-CM | POA: Diagnosis not present

## 2016-10-23 DIAGNOSIS — R51 Headache: Secondary | ICD-10-CM | POA: Diagnosis not present

## 2016-10-23 DIAGNOSIS — T8249XA Other complication of vascular dialysis catheter, initial encounter: Secondary | ICD-10-CM | POA: Diagnosis not present

## 2016-10-23 DIAGNOSIS — E1129 Type 2 diabetes mellitus with other diabetic kidney complication: Secondary | ICD-10-CM | POA: Diagnosis not present

## 2016-10-23 DIAGNOSIS — D509 Iron deficiency anemia, unspecified: Secondary | ICD-10-CM | POA: Diagnosis not present

## 2016-10-23 DIAGNOSIS — N2581 Secondary hyperparathyroidism of renal origin: Secondary | ICD-10-CM | POA: Diagnosis not present

## 2016-10-23 DIAGNOSIS — N186 End stage renal disease: Secondary | ICD-10-CM | POA: Diagnosis not present

## 2016-10-23 DIAGNOSIS — D631 Anemia in chronic kidney disease: Secondary | ICD-10-CM | POA: Diagnosis not present

## 2016-10-23 DIAGNOSIS — D688 Other specified coagulation defects: Secondary | ICD-10-CM | POA: Diagnosis not present

## 2016-10-26 DIAGNOSIS — D631 Anemia in chronic kidney disease: Secondary | ICD-10-CM | POA: Diagnosis not present

## 2016-10-26 DIAGNOSIS — D509 Iron deficiency anemia, unspecified: Secondary | ICD-10-CM | POA: Diagnosis not present

## 2016-10-26 DIAGNOSIS — R51 Headache: Secondary | ICD-10-CM | POA: Diagnosis not present

## 2016-10-26 DIAGNOSIS — D688 Other specified coagulation defects: Secondary | ICD-10-CM | POA: Diagnosis not present

## 2016-10-26 DIAGNOSIS — E1129 Type 2 diabetes mellitus with other diabetic kidney complication: Secondary | ICD-10-CM | POA: Diagnosis not present

## 2016-10-26 DIAGNOSIS — N2581 Secondary hyperparathyroidism of renal origin: Secondary | ICD-10-CM | POA: Diagnosis not present

## 2016-10-26 DIAGNOSIS — T8249XA Other complication of vascular dialysis catheter, initial encounter: Secondary | ICD-10-CM | POA: Diagnosis not present

## 2016-10-26 DIAGNOSIS — N186 End stage renal disease: Secondary | ICD-10-CM | POA: Diagnosis not present

## 2016-10-28 DIAGNOSIS — N186 End stage renal disease: Secondary | ICD-10-CM | POA: Diagnosis not present

## 2016-10-28 DIAGNOSIS — T8249XA Other complication of vascular dialysis catheter, initial encounter: Secondary | ICD-10-CM | POA: Diagnosis not present

## 2016-10-28 DIAGNOSIS — D688 Other specified coagulation defects: Secondary | ICD-10-CM | POA: Diagnosis not present

## 2016-10-28 DIAGNOSIS — D509 Iron deficiency anemia, unspecified: Secondary | ICD-10-CM | POA: Diagnosis not present

## 2016-10-28 DIAGNOSIS — E1129 Type 2 diabetes mellitus with other diabetic kidney complication: Secondary | ICD-10-CM | POA: Diagnosis not present

## 2016-10-28 DIAGNOSIS — R51 Headache: Secondary | ICD-10-CM | POA: Diagnosis not present

## 2016-10-28 DIAGNOSIS — N2581 Secondary hyperparathyroidism of renal origin: Secondary | ICD-10-CM | POA: Diagnosis not present

## 2016-10-28 DIAGNOSIS — D631 Anemia in chronic kidney disease: Secondary | ICD-10-CM | POA: Diagnosis not present

## 2016-10-30 DIAGNOSIS — N2581 Secondary hyperparathyroidism of renal origin: Secondary | ICD-10-CM | POA: Diagnosis not present

## 2016-10-30 DIAGNOSIS — E1129 Type 2 diabetes mellitus with other diabetic kidney complication: Secondary | ICD-10-CM | POA: Diagnosis not present

## 2016-10-30 DIAGNOSIS — N186 End stage renal disease: Secondary | ICD-10-CM | POA: Diagnosis not present

## 2016-10-30 DIAGNOSIS — R51 Headache: Secondary | ICD-10-CM | POA: Diagnosis not present

## 2016-10-30 DIAGNOSIS — D688 Other specified coagulation defects: Secondary | ICD-10-CM | POA: Diagnosis not present

## 2016-10-30 DIAGNOSIS — D631 Anemia in chronic kidney disease: Secondary | ICD-10-CM | POA: Diagnosis not present

## 2016-10-30 DIAGNOSIS — D509 Iron deficiency anemia, unspecified: Secondary | ICD-10-CM | POA: Diagnosis not present

## 2016-10-30 DIAGNOSIS — T8249XA Other complication of vascular dialysis catheter, initial encounter: Secondary | ICD-10-CM | POA: Diagnosis not present

## 2016-11-01 ENCOUNTER — Other Ambulatory Visit: Payer: Self-pay | Admitting: Vascular Surgery

## 2016-11-02 DIAGNOSIS — D631 Anemia in chronic kidney disease: Secondary | ICD-10-CM | POA: Diagnosis not present

## 2016-11-02 DIAGNOSIS — E1129 Type 2 diabetes mellitus with other diabetic kidney complication: Secondary | ICD-10-CM | POA: Diagnosis not present

## 2016-11-02 DIAGNOSIS — R51 Headache: Secondary | ICD-10-CM | POA: Diagnosis not present

## 2016-11-02 DIAGNOSIS — D688 Other specified coagulation defects: Secondary | ICD-10-CM | POA: Diagnosis not present

## 2016-11-02 DIAGNOSIS — D509 Iron deficiency anemia, unspecified: Secondary | ICD-10-CM | POA: Diagnosis not present

## 2016-11-02 DIAGNOSIS — N186 End stage renal disease: Secondary | ICD-10-CM | POA: Diagnosis not present

## 2016-11-02 DIAGNOSIS — T8249XA Other complication of vascular dialysis catheter, initial encounter: Secondary | ICD-10-CM | POA: Diagnosis not present

## 2016-11-02 DIAGNOSIS — N2581 Secondary hyperparathyroidism of renal origin: Secondary | ICD-10-CM | POA: Diagnosis not present

## 2016-11-04 DIAGNOSIS — D509 Iron deficiency anemia, unspecified: Secondary | ICD-10-CM | POA: Diagnosis not present

## 2016-11-04 DIAGNOSIS — E1129 Type 2 diabetes mellitus with other diabetic kidney complication: Secondary | ICD-10-CM | POA: Diagnosis not present

## 2016-11-04 DIAGNOSIS — N186 End stage renal disease: Secondary | ICD-10-CM | POA: Diagnosis not present

## 2016-11-04 DIAGNOSIS — N2581 Secondary hyperparathyroidism of renal origin: Secondary | ICD-10-CM | POA: Diagnosis not present

## 2016-11-04 DIAGNOSIS — R51 Headache: Secondary | ICD-10-CM | POA: Diagnosis not present

## 2016-11-04 DIAGNOSIS — T8249XA Other complication of vascular dialysis catheter, initial encounter: Secondary | ICD-10-CM | POA: Diagnosis not present

## 2016-11-04 DIAGNOSIS — D631 Anemia in chronic kidney disease: Secondary | ICD-10-CM | POA: Diagnosis not present

## 2016-11-04 DIAGNOSIS — D688 Other specified coagulation defects: Secondary | ICD-10-CM | POA: Diagnosis not present

## 2016-11-06 DIAGNOSIS — T8249XA Other complication of vascular dialysis catheter, initial encounter: Secondary | ICD-10-CM | POA: Diagnosis not present

## 2016-11-06 DIAGNOSIS — N2581 Secondary hyperparathyroidism of renal origin: Secondary | ICD-10-CM | POA: Diagnosis not present

## 2016-11-06 DIAGNOSIS — R51 Headache: Secondary | ICD-10-CM | POA: Diagnosis not present

## 2016-11-06 DIAGNOSIS — N186 End stage renal disease: Secondary | ICD-10-CM | POA: Diagnosis not present

## 2016-11-06 DIAGNOSIS — D631 Anemia in chronic kidney disease: Secondary | ICD-10-CM | POA: Diagnosis not present

## 2016-11-06 DIAGNOSIS — D509 Iron deficiency anemia, unspecified: Secondary | ICD-10-CM | POA: Diagnosis not present

## 2016-11-06 DIAGNOSIS — E1129 Type 2 diabetes mellitus with other diabetic kidney complication: Secondary | ICD-10-CM | POA: Diagnosis not present

## 2016-11-06 DIAGNOSIS — D688 Other specified coagulation defects: Secondary | ICD-10-CM | POA: Diagnosis not present

## 2016-11-08 ENCOUNTER — Ambulatory Visit (INDEPENDENT_AMBULATORY_CARE_PROVIDER_SITE_OTHER): Payer: Medicare Other | Admitting: Family Medicine

## 2016-11-08 ENCOUNTER — Encounter: Payer: Self-pay | Admitting: Family Medicine

## 2016-11-08 VITALS — BP 130/62 | HR 80 | Temp 98.7°F | Ht 62.0 in | Wt 227.0 lb

## 2016-11-08 DIAGNOSIS — E118 Type 2 diabetes mellitus with unspecified complications: Secondary | ICD-10-CM | POA: Diagnosis not present

## 2016-11-08 LAB — POCT GLYCOSYLATED HEMOGLOBIN (HGB A1C): Hemoglobin A1C: 7.7

## 2016-11-08 MED ORDER — INSULIN GLARGINE 100 UNIT/ML SOLOSTAR PEN: 30.0000 [IU] | PEN_INJECTOR | Freq: Every day | SUBCUTANEOUS | 0 refills | Status: DC

## 2016-11-08 NOTE — Patient Instructions (Signed)
It was great seeing you today! We have addressed the following issues today  1. I will not start you on Novolog (rapid acting insulin) for now, I will give you a chance to make some changes to your lifestyle ( i.e. Diet, exercise). We will check your A1c in 3 months if it improves we will continue with current plan, if it worsens or stay the same, we will add more medications. 2. You are scheduled for a wellness this Wednesday.  If we did any lab work today, and the results require attention, either me or my nurse will get in touch with you. If everything is normal, you will get a letter in mail and a message via . If you don't hear from [place] in two weeks, please give [place] a call. Otherwise, we look forward to seeing you again at your next visit. If you have any questions or concerns before then, please call the clinic at [phone].  Please bring all your medications to every doctors visit  Sign up for My Chart to have easy access to your labs results, and communication with your Primary care physician. Please ask Front Desk for some assistance.   Please check-out at the front desk before leaving the clinic.    Take Care,   Dr. Andy Gauss  Diabetes Mellitus and Food It is important for you to manage your blood sugar (glucose) level. Your blood glucose level can be greatly affected by what you eat. Eating healthier foods in the appropriate amounts throughout the day at about the same time each day will help you control your blood glucose level. It can also help slow or prevent worsening of your diabetes mellitus. Healthy eating may even help you improve the level of your blood pressure and reach or maintain a healthy weight. General recommendations for healthful eating and cooking habits include:  Eating meals and snacks regularly. Avoid going long periods of time without eating to lose weight.  Eating a diet that consists mainly of plant-based foods, such as fruits, vegetables, nuts, legumes,  and whole grains.  Using low-heat cooking methods, such as baking, instead of high-heat cooking methods, such as deep frying.  Work with your dietitian to make sure you understand how to use the Nutrition Facts information on food labels. How can food affect me? Carbohydrates Carbohydrates affect your blood glucose level more than any other type of food. Your dietitian will help you determine how many carbohydrates to eat at each meal and teach you how to count carbohydrates. Counting carbohydrates is important to keep your blood glucose at a healthy level, especially if you are using insulin or taking certain medicines for diabetes mellitus. Alcohol Alcohol can cause sudden decreases in blood glucose (hypoglycemia), especially if you use insulin or take certain medicines for diabetes mellitus. Hypoglycemia can be a life-threatening condition. Symptoms of hypoglycemia (sleepiness, dizziness, and disorientation) are similar to symptoms of having too much alcohol. If your health care provider has given you approval to drink alcohol, do so in moderation and use the following guidelines:  Women should not have more than one drink per day, and men should not have more than two drinks per day. One drink is equal to: ? 12 oz of beer. ? 5 oz of wine. ? 1 oz of hard liquor.  Do not drink on an empty stomach.  Keep yourself hydrated. Have water, diet soda, or unsweetened iced tea.  Regular soda, juice, and other mixers might contain a lot of carbohydrates and  should be counted.  What foods are not recommended? As you make food choices, it is important to remember that all foods are not the same. Some foods have fewer nutrients per serving than other foods, even though they might have the same number of calories or carbohydrates. It is difficult to get your body what it needs when you eat foods with fewer nutrients. Examples of foods that you should avoid that are high in calories and carbohydrates but  low in nutrients include:  Trans fats (most processed foods list trans fats on the Nutrition Facts label).  Regular soda.  Juice.  Candy.  Sweets, such as cake, pie, doughnuts, and cookies.  Fried foods.  What foods can I eat? Eat nutrient-rich foods, which will nourish your body and keep you healthy. The food you should eat also will depend on several factors, including:  The calories you need.  The medicines you take.  Your weight.  Your blood glucose level.  Your blood pressure level.  Your cholesterol level.  You should eat a variety of foods, including:  Protein. ? Lean cuts of meat. ? Proteins low in saturated fats, such as fish, egg whites, and beans. Avoid processed meats.  Fruits and vegetables. ? Fruits and vegetables that may help control blood glucose levels, such as apples, mangoes, and yams.  Dairy products. ? Choose fat-free or low-fat dairy products, such as milk, yogurt, and cheese.  Grains, bread, pasta, and rice. ? Choose whole grain products, such as multigrain bread, whole oats, and brown rice. These foods may help control blood pressure.  Fats. ? Foods containing healthful fats, such as nuts, avocado, olive oil, canola oil, and fish.  Does everyone with diabetes mellitus have the same meal plan? Because every person with diabetes mellitus is different, there is not one meal plan that works for everyone. It is very important that you meet with a dietitian who will help you create a meal plan that is just right for you. This information is not intended to replace advice given to you by your health care provider. Make sure you discuss any questions you have with your health care provider. Document Released: 12/31/2004 Document Revised: 09/11/2015 Document Reviewed: 03/02/2013 Elsevier Interactive Patient Education  2017 [employer].

## 2016-11-09 DIAGNOSIS — N186 End stage renal disease: Secondary | ICD-10-CM | POA: Diagnosis not present

## 2016-11-09 DIAGNOSIS — E1129 Type 2 diabetes mellitus with other diabetic kidney complication: Secondary | ICD-10-CM | POA: Diagnosis not present

## 2016-11-09 DIAGNOSIS — D631 Anemia in chronic kidney disease: Secondary | ICD-10-CM | POA: Diagnosis not present

## 2016-11-09 DIAGNOSIS — N2581 Secondary hyperparathyroidism of renal origin: Secondary | ICD-10-CM | POA: Diagnosis not present

## 2016-11-09 DIAGNOSIS — D688 Other specified coagulation defects: Secondary | ICD-10-CM | POA: Diagnosis not present

## 2016-11-09 DIAGNOSIS — D509 Iron deficiency anemia, unspecified: Secondary | ICD-10-CM | POA: Diagnosis not present

## 2016-11-09 DIAGNOSIS — R51 Headache: Secondary | ICD-10-CM | POA: Diagnosis not present

## 2016-11-09 DIAGNOSIS — T8249XA Other complication of vascular dialysis catheter, initial encounter: Secondary | ICD-10-CM | POA: Diagnosis not present

## 2016-11-10 ENCOUNTER — Ambulatory Visit (INDEPENDENT_AMBULATORY_CARE_PROVIDER_SITE_OTHER): Payer: Medicare Other | Admitting: *Deleted

## 2016-11-10 ENCOUNTER — Encounter: Payer: Self-pay | Admitting: *Deleted

## 2016-11-10 VITALS — BP 108/56 | HR 73 | Temp 98.4°F | Ht 62.0 in | Wt 224.6 lb

## 2016-11-10 DIAGNOSIS — Z1211 Encounter for screening for malignant neoplasm of colon: Secondary | ICD-10-CM

## 2016-11-10 DIAGNOSIS — Z0000 Encounter for general adult medical examination without abnormal findings: Secondary | ICD-10-CM | POA: Diagnosis not present

## 2016-11-10 NOTE — Progress Notes (Signed)
   Subjective:    Patient ID: Rachel Hobbs, female    DOB: [birth year], 76 y.o.   MRN: [id number]   CC: A1c check  HPI: Patient is 76 yo female with a past medical history significant for ESRD on dialysis (T,Th,S), T2DM, CHF, HLD, PVD who present today for A1c check. No other medical complaints. Patient endorse increase intake of sweet in the past few months. Denies any polyuria, polydipsia.   Smoking status reviewed   ROS: all other systems were reviewed and are negative other than in the HPI   Past Medical History:  Diagnosis Date  . Arthritis   . CHF (congestive heart failure) ([place])   . Depression   . Diabetes mellitus    type 2  . Hernia   . Hyperlipidemia   . Hypertension   . Iron deficiency anemia   . Low iron   . Peripheral vascular disease (H[facility])    in legs  . Renal disorder    CKD - dialysis T/TH/Sa  . Shortness of breath dyspnea    with exertion   Past Surgical History:  Procedure Laterality Date  . ABDOMINAL HYSTERECTOMY     in the 70's  . AV FISTULA PLACEMENT Left 12/05/2015   Procedure: RADIOCEPHALIC VERSUS BRACHIOCEPHALIC ARTERIOVENOUS (AV) FISTULA CREATION;  Surgeon: [name], MD;  Location: [facility];  Service: Vascular;  Laterality: Left;  . BASCILIC VEIN TRANSPOSITION Left 06/04/2016   Procedure: FIRST STAGE BASILIC VEIN TRANSPOSITION;  Surgeon: [name], MD;  Location: [facility];  Service: Vascular;  Laterality: Left;  . BASCILIC VEIN TRANSPOSITION Left 08/04/2016   Procedure: LEFT UPPER ARM [place], SECOND STAGE;  Surgeon: [name], MD;  Location: [facility];  Service: Vascular;  Laterality: Left;  . HERNIA REPAIR     umbilical in the 10'X  . INSERTION OF DIALYSIS CATHETER Right 12/05/2015   Procedure: INSERTION OF DIALYSIS CATHETER;  Surgeon: [name], MD;  Location: [facility];  Service: Vascular;  Laterality: Right;    Objective:  BP 130/62   Pulse 80   Temp 98.7 F (37.1 C) (Oral)   Ht 5\' 2"  (1.575 m)   Wt 227 lb  (103 kg)   SpO2 99%   BMI 41.52 kg/m   Vitals and nursing note reviewed  General: NAD, pleasant, able to participate in exam Cardiac: RRR, normal heart sounds, no murmurs. 2+ radial and PT pulses bilaterally Respiratory: CTAB, normal effort, No wheezes, rales or rhonchi Abdomen: soft, nontender, nondistended, no hepatic or splenomegaly, +BS Extremities: no edema or cyanosis. WWP. Skin: warm and dry, no rashes noted Neuro: alert and oriented x4, no focal deficits Psych: Normal affect and mood   Assessment & Plan:   #T2DM management A1c 7.7. No change from 07/2016. Patient A1c was 6.5 on 06/2015. Patient close to goal of low 7's given age and comorbidities. Discuss dietary choices and offer handout to help with changes. Will add mealtime insulin if continue to increase as previously suggested by previous PCP Dr. Jerline Pain. --Follow up in clinic in three months for next A1c check or sooner as needed   Marjie Skiff, MD [place] PGY-2

## 2016-11-10 NOTE — Patient Instructions (Addendum)
Rachel Hobbs,  Thank you for taking time to come for yourMedicare Wellness Visit. I appreciate your ongoing commitment to your health goals. Please review the following plan we discussed and let me know if I can assist you in the future.   These are the goals we discussed:  Goals    . Consume at least 2-4 vegetables daily.     . Exercise 2x per week (30 min per time)     . Increase water intake          Fruit infused    . Weight < 250 lb (113.399 kg)       Diabetes and Foot Care Diabetes may cause you to have problems because of poor blood supply (circulation) to your feet and legs. This may cause the skin on your feet to become thinner, break easier, and heal more slowly. Your skin may become dry, and the skin may peel and crack. You may also have nerve damage in your legs and feet causing decreased feeling in them. You may not notice minor injuries to your feet that could lead to infections or more serious problems. Taking care of your feet is one of the most important things you can do for yourself. Follow these instructions at home:  Wear shoes at all times, even in the house. Do not go barefoot. Bare feet are easily injured.  Check your feet daily for blisters, cuts, and redness. If you cannot see the bottom of your feet, use a mirror or ask someone for help.  Wash your feet with warm water (do not use hot water) and mild soap. Then pat your feet and the areas between your toes until they are completely dry. Do not soak your feet as this can dry your skin.  Apply a moisturizing lotion or petroleum jelly (that does not contain alcohol and is unscented) to the skin on your feet and to dry, brittle toenails. Do not apply lotion between your toes.  Trim your toenails straight across. Do not dig under them or around the cuticle. File the edges of your nails with an emery board or nail file.  Do not cut corns or calluses or try to remove them with medicine.  Wear clean socks or  stockings every day. Make sure they are not too tight. Do not wear knee-high stockings since they may decrease blood flow to your legs.  Wear shoes that fit properly and have enough cushioning. To break in new shoes, wear them for just a few hours a day. This prevents you from injuring your feet. Always look in your shoes before you put them on to be sure there are no objects inside.  Do not cross your legs. This may decrease the blood flow to your feet.  If you find a minor scrape, cut, or break in the skin on your feet, keep it and the skin around it clean and dry. These areas may be cleansed with mild soap and water. Do not cleanse the area with peroxide, alcohol, or iodine.  When you remove an adhesive bandage, be sure not to damage the skin around it.  If you have a wound, look at it several times a day to make sure it is healing.  Do not use heating pads or hot water bottles. They may burn your skin. If you have lost feeling in your feet or legs, you may not know it is happening until it is too late.  Make sure your health care provider performs  a complete foot exam at least annually or more often if you have foot problems. Report any cuts, sores, or bruises to your health care provider immediately. Contact a health care provider if:  You have an injury that is not healing.  You have cuts or breaks in the skin.  You have an ingrown nail.  You notice redness on your legs or feet.  You feel burning or tingling in your legs or feet.  You have pain or cramps in your legs and feet.  Your legs or feet are numb.  Your feet always feel cold. Get help right away if:  There is increasing redness, swelling, or pain in or around a wound.  There is a red line that goes up your leg.  Pus is coming from a wound.  You develop a fever or as directed by your health care provider.  You notice a bad smell coming from an ulcer or wound. This information is not intended to replace advice  given to you by your health care provider. Make sure you discuss any questions you have with your health care provider. Document Released: 04/02/2000 Document Revised: 09/11/2015 Document Reviewed: 09/12/2012 Elsevier Interactive Patient Education  2017 [place] Prevention in the Home Falls can cause injuries. They can happen to people of all ages. There are many things you can do to make your home safe and to help prevent falls. What can I do on the outside of my home?  Regularly fix the edges of walkways and driveways and fix any cracks.  Remove anything that might make you trip as you walk through a door, such as a raised step or threshold.  Trim any bushes or trees on the path to your home.  Use bright outdoor lighting.  Clear any walking paths of anything that might make someone trip, such as rocks or tools.  Regularly check to see if handrails are loose or broken. Make sure that both sides of any steps have handrails.  Any raised decks and porches should have guardrails on the edges.  Have any leaves, snow, or ice cleared regularly.  Use sand or salt on walking paths during winter.  Clean up any spills in your garage right away. This includes oil or grease spills. What can I do in the bathroom?  Use night lights.  Install grab bars by the toilet and in the tub and shower. Do not use towel bars as grab bars.  Use non-skid mats or decals in the tub or shower.  If you need to sit down in the shower, use a plastic, non-slip stool.  Keep the floor dry. Clean up any water that spills on the floor as soon as it happens.  Remove soap buildup in the tub or shower regularly.  Attach bath mats securely with double-sided non-slip rug tape.  Do not have throw rugs and other things on the floor that can make you trip. What can I do in the bedroom?  Use night lights.  Make sure that you have a light by your bed that is easy to reach.  Do not use any sheets or  blankets that are too big for your bed. They should not hang down onto the floor.  Have a firm chair that has side arms. You can use this for support while you get dressed.  Do not have throw rugs and other things on the floor that can make you trip. What can I do in the kitchen?  Clean up any spills right away.  Avoid walking on wet floors.  Keep items that you use a lot in easy-to-reach places.  If you need to reach something above you, use a strong step stool that has a grab bar.  Keep electrical cords out of the way.  Do not use floor polish or wax that makes floors slippery. If you must use wax, use non-skid floor wax.  Do not have throw rugs and other things on the floor that can make you trip. What can I do with my stairs?  Do not leave any items on the stairs.  Make sure that there are handrails on both sides of the stairs and use them. Fix handrails that are broken or loose. Make sure that handrails are as long as the stairways.  Check any carpeting to make sure that it is firmly attached to the stairs. Fix any carpet that is loose or worn.  Avoid having throw rugs at the top or bottom of the stairs. If you do have throw rugs, attach them to the floor with carpet tape.  Make sure that you have a light switch at the top of the stairs and the bottom of the stairs. If you do not have them, ask someone to add them for you. What else can I do to help prevent falls?  Wear shoes that: ? Do not have high heels. ? Have rubber bottoms. ? Are comfortable and fit you well. ? Are closed at the toe. Do not wear sandals.  If you use a stepladder: ? Make sure that it is fully opened. Do not climb a closed stepladder. ? Make sure that both sides of the stepladder are locked into place. ? Ask someone to hold it for you, if possible.  Clearly mark and make sure that you can see: ? Any grab bars or handrails. ? First and last steps. ? Where the edge of each step is.  Use tools  that help you move around (mobility aids) if they are needed. These include: ? Canes. ? Walkers. ? Scooters. ? Crutches.  Turn on the lights when you go into a dark area. Replace any light bulbs as soon as they burn out.  Set up your furniture so you have a clear path. Avoid moving your furniture around.  If any of your floors are uneven, fix them.  If there are any pets around you, be aware of where they are.  Review your medicines with your doctor. Some medicines can make you feel dizzy. This can increase your chance of falling. Ask your doctor what other things that you can do to help prevent falls. This information is not intended to replace advice given to you by your health care provider. Make sure you discuss any questions you have with your health care provider. Document Released: 01/30/2009 Document Revised: 09/11/2015 Document Reviewed: 05/10/2014 Elsevier Interactive Patient Education  2018 [employer].   Hearing Loss Hearing loss is a partial or total loss of the ability to hear. This can be temporary or permanent, and it can happen in one or both ears. Hearing loss may be referred to as deafness. Medical care is necessary to treat hearing loss properly and to prevent the condition from getting worse. Your hearing may partially or completely come back, depending on what caused your hearing loss and how severe it is. In some cases, hearing loss is permanent. What are the causes? Common causes of hearing loss include:  Too much wax in  the ear canal.  Infection of the ear canal or middle ear.  Fluid in the middle ear.  Injury to the ear or surrounding area.  An object stuck in the ear.  Prolonged exposure to loud sounds, such as music.  Less common causes of hearing loss include:  Tumors in the ear.  Viral or bacterial infections, such as meningitis.  A hole in the eardrum (perforated eardrum).  Problems with the hearing nerve that sends signals between the  brain and the ear.  Certain medicines.  What are the signs or symptoms? Symptoms of this condition may include:  Difficulty telling the difference between sounds.  Difficulty following a conversation when there is background noise.  Lack of response to sounds in your environment. This may be most noticeable when you do not respond to startling sounds.  Needing to turn up the volume on the television, radio, etc.  Ringing in the ears.  Dizziness.  Pain in the ears.  How is this diagnosed? This condition is diagnosed based on a physical exam and a hearing test (audiometry). The audiometry test will be performed by a hearing specialist (audiologist). You may also be referred to an ear, nose, and throat (ENT) specialist (otolaryngologist). How is this treated? Treatment for recent onset of hearing loss may include:  Ear wax removal.  Being prescribed medicines to prevent infection (antibiotics).  Being prescribed medicines to reduce inflammation (corticosteroids).  Follow these instructions at home:  If you were prescribed an antibiotic medicine, take it as told by your health care provider. Do not stop taking the antibiotic even if you start to feel better.  Take over-the-counter and prescription medicines only as told by your health care provider.  Avoid loud noises.  Return to your normal activities as told by your health care provider. Ask your health care provider what activities are safe for you.  Keep all follow-up visits as told by your health care provider. This is important. Contact a health care provider if:  You feel dizzy.  You develop new symptoms.  You vomit or feel nauseous.  You have a fever. Get help right away if:  You develop sudden changes in your vision.  You have severe ear pain.  You have new or increased weakness.  You have a severe headache. This information is not intended to replace advice given to you by your health care provider.  Make sure you discuss any questions you have with your health care provider. Document Released: 04/05/2005 Document Revised: 09/11/2015 Document Reviewed: 08/21/2014 Elsevier Interactive Patient Education  2018 [employer].

## 2016-11-10 NOTE — Progress Notes (Signed)
Subjective:   Rachel Hobbs is a 76 y.o. female who presents for Medicare Annual (Subsequent) preventive examination.  Cardiac Risk Factors include: advanced age (>71mn, >>52women);diabetes mellitus;dyslipidemia;hypertension;obesity (BMI >30kg/m2);sedentary lifestyle     Objective:     Vitals: BP (!) 108/56 (BP Location: Right Arm, Patient Position: Sitting, Cuff Size: Large)   Pulse 73   Temp 98.4 F (36.9 C) (Oral)   Ht _0  (1.575 m)   Wt 224 lb 9.6 oz (101.9 kg)   SpO2 99%   BMI 41.08 kg/m   Body mass index is 41.08 kg/m.   Tobacco History  Smoking Status  . Never Smoker  Smokeless Tobacco  . Never Used     Counseling given: Yes Patient has never smoked and has no plans to start.   Past Medical History:  Diagnosis Date  . Arthritis   . [place]   . Depression   . Diabetes mellitus    type 2  . Hernia   . Hyperlipidemia   . Hypertension   . Iron deficiency anemia   . Low iron   . Peripheral vascular disease (HCC)    in legs  . Renal disorder    CKD - dialysis T/TH/Sa  . Shortness of breath dyspnea    with exertion   Past Surgical History:  Procedure Laterality Date  . ABDOMINAL HYSTERECTOMY     in the 70's  . AV FISTULA PLACEMENT Left 12/05/2015   Procedure: RADIOCEPHALIC VERSUS BRACHIOCEPHALIC ARTERIOVENOUS (AV) FISTULA CREATION;  Surgeon: CElam Dutch MD;  [facility]  Service: Vascular;  Laterality: Left;  . BASCILIC VEIN TRANSPOSITION Left 06/04/2016   Procedure: FIRST STAGE BASILIC VEIN TRANSPOSITION;  Surgeon: VSerafina Mitchell MD;  [facility]  Service: Vascular;  Laterality: Left;  . BASCILIC VEIN TRANSPOSITION Left 08/04/2016   Procedure: LEFT UPPER ARM BVail SECOND STAGE;  Surgeon: VSerafina Mitchell MD;  [facility]  Service: V[facility];  Laterality: Left;  . HERNIA REPAIR     umbilical in the 748'A . INSERTION OF DIALYSIS CATHETER Right 12/05/2015   Procedure: INSERTION  OF DIALYSIS CATHETER;  Surgeon: CElam Dutch MD;  [facility]  Service: Vascular;  Laterality: Right;   Family History  Problem Relation Age of Onset  . Kidney disease Mother   . Hypertension Mother   . COPD Father        smoke  . Cancer Father        Lung  . Hypertension Father   . Kidney disease Sister   . Diabetes Daughter    History  Sexual Activity  . Sexual activity: No    Outpatient Encounter Prescriptions as of 11/10/2016  Medication Sig  . acetaminophen (TYLENOL) 500 MG tablet Take 1,000 mg by mouth every 6 (six) hours as needed (pain).   .Marland KitchenamLODipine (NORVASC) 10 MG tablet Take 0.5 tablets (5 mg total) by mouth every evening.  .Marland Kitchenaspirin 81 MG tablet Take 81 mg by mouth daily.    .Marland Kitchenatorvastatin (LIPITOR) 40 MG tablet TAKE 1 TABLET BY MOUTH AT NIGHT FOR CHOLESTEROL  . Blood Glucose Monitoring Suppl (BLOOD GLUCOSE METER) kit Use as instructed  . calcium acetate (PHOSLO) 667 MG capsule Take 667 mg by mouth 2 (two) times daily with a meal.   . Insulin Glargine (LANTUS SOLOSTAR) 100 UNIT/ML Solostar Pen Inject 30 Units into the skin daily at 10 pm.  . Insulin Pen Needle 31G X  8 MM MISC BD UltraFine III Pen Needles. For use with insulin pen device. Inject insulin 6 x daily  . Lancets (ONETOUCH ULTRASOFT) lancets Once daily testing plus prn for hypoglycemia  . Multiple Vitamin (MULTIVITAMIN WITH MINERALS) TABS tablet Take 1 tablet by mouth daily.  Marland Kitchen NEEDLE, DISP, 30 G (B-D DISP NEEDLE 30GX1") 30G X 1" MISC 1 each by Does not apply route daily.  . ONE TOUCH ULTRA TEST test strip TEST BLOOD SUGAR EVERY DAY AND AS NEEDED FOR SYMPTOMS OF HYPOGLYCEMIA  . senna (SENOKOT) 8.6 MG tablet Take 1 tablet by mouth daily as needed for constipation.  . triamcinolone cream (KENALOG) 0.1 % Apply 1 application topically 2 (two) times daily as needed. (Patient taking differently: Apply 1 application topically 2 (two) times daily as needed (itching). )  . isosorbide mononitrate (IMDUR) 30  MG 24 hr tablet Take 30 mg by mouth every evening.   . [DISCONTINUED] Insulin Glargine (LANTUS SOLOSTAR) 100 UNIT/ML Solostar Pen INJECT 30 UNITS INTO THE SKIN EVERY NIGHT AFTER SUPPER. (Patient not taking: Reported on 11/10/2016)  . [DISCONTINUED] oxyCODONE-acetaminophen (ROXICET) 5-325 MG tablet Take 1 tablet by mouth every 6 (six) hours as needed for severe pain. (Patient not taking: Reported on 11/10/2016)   No facility-administered encounter medications on file as of 11/10/2016.     Activities of Daily Living In your present state of health, do you have any difficulty performing the following activities: 11/10/2016 08/04/2016  Hearing? N -  Vision? N -  Difficulty concentrating or making decisions? N Y  Walking or climbing stairs? N N  Dressing or bathing? N N  Doing errands, shopping? N -  Preparing Food and eating ? N -  Using the Toilet? N -  In the past six months, have you accidently leaked urine? N -  Do you have problems with loss of bowel control? N -  Managing your Medications? N -  Managing your Finances? N -  Housekeeping or managing your Housekeeping? N -  Some recent data might be hidden   Home Safety:  My home has a working smoke alarm:  Yes X 1           My home throw rugs have been fastened down to the floor or removed:  Wall-to-wall carpeting only I have a non-slip surface or non-slip mats in the bathtub and shower:  Yes and shower chair        All my home's stairs have handrails, including any outdoor stairs:  One level home with no outside steps and ramp with handrails          My home's floors, stairs and hallways are free from clutter, wires and cords:  Yes     I have animals in my home:  No I wear seatbelts consistently:  Yes   Patient Care Team: Marjie Skiff, MD as PCP - General (Family Medicine) Corliss Parish, MD (Nephrology) Ralene Bathe, MD (Ophthalmology) Bensimhon, Shaune Pascal, MD (Cardiology) [facility] Gardiner Barefoot,  DPM as Consulting Physician (Podiatry)    Assessment:     Exercise Activities and Dietary recommendations Current Exercise Habits: Home exercise routine (Walks at [place] and goes to [facility]), Type of exercise: walking, Time (Minutes): 30, Frequency (Times/Week): 2, Weekly Exercise (Minutes/Week): 60, Intensity: Mild, Exercise limited by: orthopedic condition(s) (Dialysis 3 X weekly)  Goals    . Consume at least 2-4 vegetables daily.     . Exercise 2x per week (30 min per time)     .  Increase water intake          Fruit infused    . Weight < 250 lb (113.399 kg)      Fall Risk Fall Risk  11/10/2016 11/08/2016 08/23/2016 07/26/2016 08/04/2015  Falls in the past year? _0   Risk for fall due to : Impaired balance/gait;Impaired mobility - - - -   TUG Test:  Done in 17 seconds. Patient used both hands to push out of chair and to sit back down. Ambulated with cane in right hand. Falls prevention discussed in detail and literature given.  Cognitive Function: Mini-Cog  Passed with score 4/5   Depression Screen PHQ 2/9 Scores 11/10/2016 11/08/2016 08/23/2016 07/26/2016  PHQ - 2 Score 2 0 0 0  PHQ- 9 Score 4 - - -  [facility] contact info given   Cognitive Function MMSE - Mini Mental State Exam 06/27/2013 10/15/2010  Orientation to time 5 5  Orientation to Place 5 5  Registration 3 3  Attention/ Calculation 4 5  Recall 2 2  Language- name 2 objects 2 2  Language- repeat 1 1  Language- follow 3 step command 3 3  Language- read & follow direction 1 1  Write a sentence 1 1  Copy design 1 1  Total score 28 29        Immunization History  Administered Date(s) Administered  . Influenza Split 04/25/2012  . Influenza,inj,Quad PF,36+ Mos 05/22/2014, 02/13/2015  . Influenza-Unspecified 04/19/2013  . Td 01/22/2005   Screening Tests Health Maintenance  Topic Date Due  . DEXA SCAN  04/22/2006  . PNA vac Low Risk Adult (1 of 2 - PCV13) 04/22/2006  .  TETANUS/TDAP  01/23/2015  . FOOT EXAM  11/22/2015  . INFLUENZA VACCINE  11/17/2016  . OPHTHALMOLOGY EXAM  01/20/2017  . HEMOGLOBIN A1C  05/11/2017  . COLONOSCOPY  10/14/2020  Patient to discuss Dexa scan with PCP 2/2 age Patient declined Prevnar at this time. Literature on Prevnar 13 and pneumovax 23 given to patient to review. Discussed obtaining TDaP at local pharmacy Discussed Shingrix Discussed receiving flu vaccine yearly starting 9/1.  Received last season at dialysis center  Foot exam done at podiatry 09/17/2016  Declines colonoscopy; patient given FIT with instructions from lab staff Plan:     Patient to discuss Dexa scan with PCP 2/2 age Patient declined Prevnar at this time. Literature on Prevnar 13 and pneumovax 23 given to patient to review. Discussed obtaining TDaP at local pharmacy Discussed Shingrix Discussed receiving flu vaccine yearly starting 9/1.  Received last season at dialysis center Foot exam done at podiatry 09/17/2016  Declines colonoscopy; patient given FIT with instructions from lab staff    I have personally reviewed and noted the following in the patient's chart:   . Medical and social history . Use of alcohol, tobacco or illicit drugs  . Current medications and supplements . Functional ability and status . Nutritional status . Physical activity . Advanced directives . List of other physicians . Hospitalizations, surgeries, and ER visits in previous 12 months . Vitals . Screenings to include cognitive, depression, and falls . Referrals and appointments  In addition, I have reviewed and discussed with patient certain preventive protocols, quality metrics, and best practice recommendations. A written personalized care plan for preventive services as well as general preventive health recommendations were provided to patient.     Velora Heckler, RN  11/10/2016

## 2016-11-11 DIAGNOSIS — N186 End stage renal disease: Secondary | ICD-10-CM | POA: Diagnosis not present

## 2016-11-11 DIAGNOSIS — D631 Anemia in chronic kidney disease: Secondary | ICD-10-CM | POA: Diagnosis not present

## 2016-11-11 DIAGNOSIS — N2581 Secondary hyperparathyroidism of renal origin: Secondary | ICD-10-CM | POA: Diagnosis not present

## 2016-11-11 DIAGNOSIS — D688 Other specified coagulation defects: Secondary | ICD-10-CM | POA: Diagnosis not present

## 2016-11-11 DIAGNOSIS — E1129 Type 2 diabetes mellitus with other diabetic kidney complication: Secondary | ICD-10-CM | POA: Diagnosis not present

## 2016-11-11 DIAGNOSIS — T8249XA Other complication of vascular dialysis catheter, initial encounter: Secondary | ICD-10-CM | POA: Diagnosis not present

## 2016-11-11 DIAGNOSIS — R51 Headache: Secondary | ICD-10-CM | POA: Diagnosis not present

## 2016-11-11 DIAGNOSIS — D509 Iron deficiency anemia, unspecified: Secondary | ICD-10-CM | POA: Diagnosis not present

## 2016-11-13 DIAGNOSIS — D631 Anemia in chronic kidney disease: Secondary | ICD-10-CM | POA: Diagnosis not present

## 2016-11-13 DIAGNOSIS — E1129 Type 2 diabetes mellitus with other diabetic kidney complication: Secondary | ICD-10-CM | POA: Diagnosis not present

## 2016-11-13 DIAGNOSIS — R51 Headache: Secondary | ICD-10-CM | POA: Diagnosis not present

## 2016-11-13 DIAGNOSIS — D688 Other specified coagulation defects: Secondary | ICD-10-CM | POA: Diagnosis not present

## 2016-11-13 DIAGNOSIS — N2581 Secondary hyperparathyroidism of renal origin: Secondary | ICD-10-CM | POA: Diagnosis not present

## 2016-11-13 DIAGNOSIS — N186 End stage renal disease: Secondary | ICD-10-CM | POA: Diagnosis not present

## 2016-11-13 DIAGNOSIS — D509 Iron deficiency anemia, unspecified: Secondary | ICD-10-CM | POA: Diagnosis not present

## 2016-11-13 DIAGNOSIS — T8249XA Other complication of vascular dialysis catheter, initial encounter: Secondary | ICD-10-CM | POA: Diagnosis not present

## 2016-11-15 DIAGNOSIS — Z1211 Encounter for screening for malignant neoplasm of colon: Secondary | ICD-10-CM | POA: Diagnosis not present

## 2016-11-16 DIAGNOSIS — Z992 Dependence on renal dialysis: Secondary | ICD-10-CM | POA: Diagnosis not present

## 2016-11-16 DIAGNOSIS — T8249XA Other complication of vascular dialysis catheter, initial encounter: Secondary | ICD-10-CM | POA: Diagnosis not present

## 2016-11-16 DIAGNOSIS — E1129 Type 2 diabetes mellitus with other diabetic kidney complication: Secondary | ICD-10-CM | POA: Diagnosis not present

## 2016-11-16 DIAGNOSIS — R51 Headache: Secondary | ICD-10-CM | POA: Diagnosis not present

## 2016-11-16 DIAGNOSIS — D631 Anemia in chronic kidney disease: Secondary | ICD-10-CM | POA: Diagnosis not present

## 2016-11-16 DIAGNOSIS — N186 End stage renal disease: Secondary | ICD-10-CM | POA: Diagnosis not present

## 2016-11-16 DIAGNOSIS — D509 Iron deficiency anemia, unspecified: Secondary | ICD-10-CM | POA: Diagnosis not present

## 2016-11-16 DIAGNOSIS — D688 Other specified coagulation defects: Secondary | ICD-10-CM | POA: Diagnosis not present

## 2016-11-16 DIAGNOSIS — N2581 Secondary hyperparathyroidism of renal origin: Secondary | ICD-10-CM | POA: Diagnosis not present

## 2016-11-16 LAB — FECAL OCCULT BLOOD, IMMUNOCHEMICAL: FECAL OCCULT BLD: NEGATIVE

## 2016-11-18 DIAGNOSIS — D688 Other specified coagulation defects: Secondary | ICD-10-CM | POA: Diagnosis not present

## 2016-11-18 DIAGNOSIS — E1129 Type 2 diabetes mellitus with other diabetic kidney complication: Secondary | ICD-10-CM | POA: Diagnosis not present

## 2016-11-18 DIAGNOSIS — N2581 Secondary hyperparathyroidism of renal origin: Secondary | ICD-10-CM | POA: Diagnosis not present

## 2016-11-18 DIAGNOSIS — D509 Iron deficiency anemia, unspecified: Secondary | ICD-10-CM | POA: Diagnosis not present

## 2016-11-18 DIAGNOSIS — N186 End stage renal disease: Secondary | ICD-10-CM | POA: Diagnosis not present

## 2016-11-18 DIAGNOSIS — D631 Anemia in chronic kidney disease: Secondary | ICD-10-CM | POA: Diagnosis not present

## 2016-11-18 DIAGNOSIS — Z23 Encounter for immunization: Secondary | ICD-10-CM | POA: Diagnosis not present

## 2016-11-18 DIAGNOSIS — T8249XA Other complication of vascular dialysis catheter, initial encounter: Secondary | ICD-10-CM | POA: Diagnosis not present

## 2016-11-20 DIAGNOSIS — N2581 Secondary hyperparathyroidism of renal origin: Secondary | ICD-10-CM | POA: Diagnosis not present

## 2016-11-20 DIAGNOSIS — D688 Other specified coagulation defects: Secondary | ICD-10-CM | POA: Diagnosis not present

## 2016-11-20 DIAGNOSIS — E1129 Type 2 diabetes mellitus with other diabetic kidney complication: Secondary | ICD-10-CM | POA: Diagnosis not present

## 2016-11-20 DIAGNOSIS — T8249XA Other complication of vascular dialysis catheter, initial encounter: Secondary | ICD-10-CM | POA: Diagnosis not present

## 2016-11-20 DIAGNOSIS — N186 End stage renal disease: Secondary | ICD-10-CM | POA: Diagnosis not present

## 2016-11-20 DIAGNOSIS — Z23 Encounter for immunization: Secondary | ICD-10-CM | POA: Diagnosis not present

## 2016-11-20 DIAGNOSIS — D509 Iron deficiency anemia, unspecified: Secondary | ICD-10-CM | POA: Diagnosis not present

## 2016-11-20 DIAGNOSIS — D631 Anemia in chronic kidney disease: Secondary | ICD-10-CM | POA: Diagnosis not present

## 2016-11-23 DIAGNOSIS — D509 Iron deficiency anemia, unspecified: Secondary | ICD-10-CM | POA: Diagnosis not present

## 2016-11-23 DIAGNOSIS — D688 Other specified coagulation defects: Secondary | ICD-10-CM | POA: Diagnosis not present

## 2016-11-23 DIAGNOSIS — T8249XA Other complication of vascular dialysis catheter, initial encounter: Secondary | ICD-10-CM | POA: Diagnosis not present

## 2016-11-23 DIAGNOSIS — N186 End stage renal disease: Secondary | ICD-10-CM | POA: Diagnosis not present

## 2016-11-23 DIAGNOSIS — D631 Anemia in chronic kidney disease: Secondary | ICD-10-CM | POA: Diagnosis not present

## 2016-11-23 DIAGNOSIS — Z23 Encounter for immunization: Secondary | ICD-10-CM | POA: Diagnosis not present

## 2016-11-23 DIAGNOSIS — E1129 Type 2 diabetes mellitus with other diabetic kidney complication: Secondary | ICD-10-CM | POA: Diagnosis not present

## 2016-11-23 DIAGNOSIS — N2581 Secondary hyperparathyroidism of renal origin: Secondary | ICD-10-CM | POA: Diagnosis not present

## 2016-11-25 DIAGNOSIS — N2581 Secondary hyperparathyroidism of renal origin: Secondary | ICD-10-CM | POA: Diagnosis not present

## 2016-11-25 DIAGNOSIS — Z23 Encounter for immunization: Secondary | ICD-10-CM | POA: Diagnosis not present

## 2016-11-25 DIAGNOSIS — N186 End stage renal disease: Secondary | ICD-10-CM | POA: Diagnosis not present

## 2016-11-25 DIAGNOSIS — D631 Anemia in chronic kidney disease: Secondary | ICD-10-CM | POA: Diagnosis not present

## 2016-11-25 DIAGNOSIS — D509 Iron deficiency anemia, unspecified: Secondary | ICD-10-CM | POA: Diagnosis not present

## 2016-11-25 DIAGNOSIS — E1129 Type 2 diabetes mellitus with other diabetic kidney complication: Secondary | ICD-10-CM | POA: Diagnosis not present

## 2016-11-25 DIAGNOSIS — T8249XA Other complication of vascular dialysis catheter, initial encounter: Secondary | ICD-10-CM | POA: Diagnosis not present

## 2016-11-25 DIAGNOSIS — D688 Other specified coagulation defects: Secondary | ICD-10-CM | POA: Diagnosis not present

## 2016-11-26 NOTE — Progress Notes (Signed)
Addendum: I have reviewed this visit and discussed with Howell Rucks, RN, BSN, and agree with her documentation.   Marjie Skiff, MD [place], PGY-2

## 2016-11-27 DIAGNOSIS — Z23 Encounter for immunization: Secondary | ICD-10-CM | POA: Diagnosis not present

## 2016-11-27 DIAGNOSIS — D509 Iron deficiency anemia, unspecified: Secondary | ICD-10-CM | POA: Diagnosis not present

## 2016-11-27 DIAGNOSIS — E1129 Type 2 diabetes mellitus with other diabetic kidney complication: Secondary | ICD-10-CM | POA: Diagnosis not present

## 2016-11-27 DIAGNOSIS — N186 End stage renal disease: Secondary | ICD-10-CM | POA: Diagnosis not present

## 2016-11-27 DIAGNOSIS — N2581 Secondary hyperparathyroidism of renal origin: Secondary | ICD-10-CM | POA: Diagnosis not present

## 2016-11-27 DIAGNOSIS — T8249XA Other complication of vascular dialysis catheter, initial encounter: Secondary | ICD-10-CM | POA: Diagnosis not present

## 2016-11-27 DIAGNOSIS — D688 Other specified coagulation defects: Secondary | ICD-10-CM | POA: Diagnosis not present

## 2016-11-27 DIAGNOSIS — D631 Anemia in chronic kidney disease: Secondary | ICD-10-CM | POA: Diagnosis not present

## 2016-11-30 DIAGNOSIS — D688 Other specified coagulation defects: Secondary | ICD-10-CM | POA: Diagnosis not present

## 2016-11-30 DIAGNOSIS — E1129 Type 2 diabetes mellitus with other diabetic kidney complication: Secondary | ICD-10-CM | POA: Diagnosis not present

## 2016-11-30 DIAGNOSIS — D509 Iron deficiency anemia, unspecified: Secondary | ICD-10-CM | POA: Diagnosis not present

## 2016-11-30 DIAGNOSIS — D631 Anemia in chronic kidney disease: Secondary | ICD-10-CM | POA: Diagnosis not present

## 2016-11-30 DIAGNOSIS — Z23 Encounter for immunization: Secondary | ICD-10-CM | POA: Diagnosis not present

## 2016-11-30 DIAGNOSIS — N2581 Secondary hyperparathyroidism of renal origin: Secondary | ICD-10-CM | POA: Diagnosis not present

## 2016-11-30 DIAGNOSIS — N186 End stage renal disease: Secondary | ICD-10-CM | POA: Diagnosis not present

## 2016-11-30 DIAGNOSIS — T8249XA Other complication of vascular dialysis catheter, initial encounter: Secondary | ICD-10-CM | POA: Diagnosis not present

## 2016-12-02 DIAGNOSIS — T8249XA Other complication of vascular dialysis catheter, initial encounter: Secondary | ICD-10-CM | POA: Diagnosis not present

## 2016-12-02 DIAGNOSIS — N2581 Secondary hyperparathyroidism of renal origin: Secondary | ICD-10-CM | POA: Diagnosis not present

## 2016-12-02 DIAGNOSIS — N186 End stage renal disease: Secondary | ICD-10-CM | POA: Diagnosis not present

## 2016-12-02 DIAGNOSIS — D631 Anemia in chronic kidney disease: Secondary | ICD-10-CM | POA: Diagnosis not present

## 2016-12-02 DIAGNOSIS — D509 Iron deficiency anemia, unspecified: Secondary | ICD-10-CM | POA: Diagnosis not present

## 2016-12-02 DIAGNOSIS — E1129 Type 2 diabetes mellitus with other diabetic kidney complication: Secondary | ICD-10-CM | POA: Diagnosis not present

## 2016-12-02 DIAGNOSIS — D688 Other specified coagulation defects: Secondary | ICD-10-CM | POA: Diagnosis not present

## 2016-12-02 DIAGNOSIS — Z23 Encounter for immunization: Secondary | ICD-10-CM | POA: Diagnosis not present

## 2016-12-04 DIAGNOSIS — N186 End stage renal disease: Secondary | ICD-10-CM | POA: Diagnosis not present

## 2016-12-04 DIAGNOSIS — E1129 Type 2 diabetes mellitus with other diabetic kidney complication: Secondary | ICD-10-CM | POA: Diagnosis not present

## 2016-12-04 DIAGNOSIS — T8249XA Other complication of vascular dialysis catheter, initial encounter: Secondary | ICD-10-CM | POA: Diagnosis not present

## 2016-12-04 DIAGNOSIS — Z23 Encounter for immunization: Secondary | ICD-10-CM | POA: Diagnosis not present

## 2016-12-04 DIAGNOSIS — D688 Other specified coagulation defects: Secondary | ICD-10-CM | POA: Diagnosis not present

## 2016-12-04 DIAGNOSIS — D509 Iron deficiency anemia, unspecified: Secondary | ICD-10-CM | POA: Diagnosis not present

## 2016-12-04 DIAGNOSIS — D631 Anemia in chronic kidney disease: Secondary | ICD-10-CM | POA: Diagnosis not present

## 2016-12-04 DIAGNOSIS — N2581 Secondary hyperparathyroidism of renal origin: Secondary | ICD-10-CM | POA: Diagnosis not present

## 2016-12-07 DIAGNOSIS — D631 Anemia in chronic kidney disease: Secondary | ICD-10-CM | POA: Diagnosis not present

## 2016-12-07 DIAGNOSIS — D509 Iron deficiency anemia, unspecified: Secondary | ICD-10-CM | POA: Diagnosis not present

## 2016-12-07 DIAGNOSIS — Z23 Encounter for immunization: Secondary | ICD-10-CM | POA: Diagnosis not present

## 2016-12-07 DIAGNOSIS — N186 End stage renal disease: Secondary | ICD-10-CM | POA: Diagnosis not present

## 2016-12-07 DIAGNOSIS — D688 Other specified coagulation defects: Secondary | ICD-10-CM | POA: Diagnosis not present

## 2016-12-07 DIAGNOSIS — E1129 Type 2 diabetes mellitus with other diabetic kidney complication: Secondary | ICD-10-CM | POA: Diagnosis not present

## 2016-12-07 DIAGNOSIS — T8249XA Other complication of vascular dialysis catheter, initial encounter: Secondary | ICD-10-CM | POA: Diagnosis not present

## 2016-12-07 DIAGNOSIS — N2581 Secondary hyperparathyroidism of renal origin: Secondary | ICD-10-CM | POA: Diagnosis not present

## 2016-12-09 DIAGNOSIS — D509 Iron deficiency anemia, unspecified: Secondary | ICD-10-CM | POA: Diagnosis not present

## 2016-12-09 DIAGNOSIS — T8249XA Other complication of vascular dialysis catheter, initial encounter: Secondary | ICD-10-CM | POA: Diagnosis not present

## 2016-12-09 DIAGNOSIS — N2581 Secondary hyperparathyroidism of renal origin: Secondary | ICD-10-CM | POA: Diagnosis not present

## 2016-12-09 DIAGNOSIS — D631 Anemia in chronic kidney disease: Secondary | ICD-10-CM | POA: Diagnosis not present

## 2016-12-09 DIAGNOSIS — N186 End stage renal disease: Secondary | ICD-10-CM | POA: Diagnosis not present

## 2016-12-09 DIAGNOSIS — E1129 Type 2 diabetes mellitus with other diabetic kidney complication: Secondary | ICD-10-CM | POA: Diagnosis not present

## 2016-12-09 DIAGNOSIS — Z23 Encounter for immunization: Secondary | ICD-10-CM | POA: Diagnosis not present

## 2016-12-09 DIAGNOSIS — D688 Other specified coagulation defects: Secondary | ICD-10-CM | POA: Diagnosis not present

## 2016-12-11 DIAGNOSIS — D688 Other specified coagulation defects: Secondary | ICD-10-CM | POA: Diagnosis not present

## 2016-12-11 DIAGNOSIS — E1129 Type 2 diabetes mellitus with other diabetic kidney complication: Secondary | ICD-10-CM | POA: Diagnosis not present

## 2016-12-11 DIAGNOSIS — T8249XA Other complication of vascular dialysis catheter, initial encounter: Secondary | ICD-10-CM | POA: Diagnosis not present

## 2016-12-11 DIAGNOSIS — N186 End stage renal disease: Secondary | ICD-10-CM | POA: Diagnosis not present

## 2016-12-11 DIAGNOSIS — D509 Iron deficiency anemia, unspecified: Secondary | ICD-10-CM | POA: Diagnosis not present

## 2016-12-11 DIAGNOSIS — N2581 Secondary hyperparathyroidism of renal origin: Secondary | ICD-10-CM | POA: Diagnosis not present

## 2016-12-11 DIAGNOSIS — Z23 Encounter for immunization: Secondary | ICD-10-CM | POA: Diagnosis not present

## 2016-12-11 DIAGNOSIS — D631 Anemia in chronic kidney disease: Secondary | ICD-10-CM | POA: Diagnosis not present

## 2016-12-13 ENCOUNTER — Other Ambulatory Visit: Payer: Self-pay | Admitting: Family Medicine

## 2016-12-13 DIAGNOSIS — E118 Type 2 diabetes mellitus with unspecified complications: Secondary | ICD-10-CM

## 2016-12-14 DIAGNOSIS — D509 Iron deficiency anemia, unspecified: Secondary | ICD-10-CM | POA: Diagnosis not present

## 2016-12-14 DIAGNOSIS — T8249XA Other complication of vascular dialysis catheter, initial encounter: Secondary | ICD-10-CM | POA: Diagnosis not present

## 2016-12-14 DIAGNOSIS — D688 Other specified coagulation defects: Secondary | ICD-10-CM | POA: Diagnosis not present

## 2016-12-14 DIAGNOSIS — N2581 Secondary hyperparathyroidism of renal origin: Secondary | ICD-10-CM | POA: Diagnosis not present

## 2016-12-14 DIAGNOSIS — Z23 Encounter for immunization: Secondary | ICD-10-CM | POA: Diagnosis not present

## 2016-12-14 DIAGNOSIS — N186 End stage renal disease: Secondary | ICD-10-CM | POA: Diagnosis not present

## 2016-12-14 DIAGNOSIS — D631 Anemia in chronic kidney disease: Secondary | ICD-10-CM | POA: Diagnosis not present

## 2016-12-14 DIAGNOSIS — E1129 Type 2 diabetes mellitus with other diabetic kidney complication: Secondary | ICD-10-CM | POA: Diagnosis not present

## 2016-12-15 ENCOUNTER — Ambulatory Visit: Payer: Medicare Other | Admitting: Podiatry

## 2016-12-15 DIAGNOSIS — T82868A Thrombosis of vascular prosthetic devices, implants and grafts, initial encounter: Secondary | ICD-10-CM | POA: Diagnosis not present

## 2016-12-15 DIAGNOSIS — I871 Compression of vein: Secondary | ICD-10-CM | POA: Diagnosis not present

## 2016-12-15 DIAGNOSIS — N186 End stage renal disease: Secondary | ICD-10-CM | POA: Diagnosis not present

## 2016-12-15 DIAGNOSIS — Z992 Dependence on renal dialysis: Secondary | ICD-10-CM | POA: Diagnosis not present

## 2016-12-16 DIAGNOSIS — N186 End stage renal disease: Secondary | ICD-10-CM | POA: Diagnosis not present

## 2016-12-16 DIAGNOSIS — D688 Other specified coagulation defects: Secondary | ICD-10-CM | POA: Diagnosis not present

## 2016-12-16 DIAGNOSIS — T8249XA Other complication of vascular dialysis catheter, initial encounter: Secondary | ICD-10-CM | POA: Diagnosis not present

## 2016-12-16 DIAGNOSIS — Z23 Encounter for immunization: Secondary | ICD-10-CM | POA: Diagnosis not present

## 2016-12-16 DIAGNOSIS — E1129 Type 2 diabetes mellitus with other diabetic kidney complication: Secondary | ICD-10-CM | POA: Diagnosis not present

## 2016-12-16 DIAGNOSIS — D509 Iron deficiency anemia, unspecified: Secondary | ICD-10-CM | POA: Diagnosis not present

## 2016-12-16 DIAGNOSIS — N2581 Secondary hyperparathyroidism of renal origin: Secondary | ICD-10-CM | POA: Diagnosis not present

## 2016-12-16 DIAGNOSIS — D631 Anemia in chronic kidney disease: Secondary | ICD-10-CM | POA: Diagnosis not present

## 2016-12-17 DIAGNOSIS — N186 End stage renal disease: Secondary | ICD-10-CM | POA: Diagnosis not present

## 2016-12-17 DIAGNOSIS — E1129 Type 2 diabetes mellitus with other diabetic kidney complication: Secondary | ICD-10-CM | POA: Diagnosis not present

## 2016-12-17 DIAGNOSIS — Z992 Dependence on renal dialysis: Secondary | ICD-10-CM | POA: Diagnosis not present

## 2016-12-18 DIAGNOSIS — R51 Headache: Secondary | ICD-10-CM | POA: Diagnosis not present

## 2016-12-18 DIAGNOSIS — E1129 Type 2 diabetes mellitus with other diabetic kidney complication: Secondary | ICD-10-CM | POA: Diagnosis not present

## 2016-12-18 DIAGNOSIS — Z23 Encounter for immunization: Secondary | ICD-10-CM | POA: Diagnosis not present

## 2016-12-18 DIAGNOSIS — D688 Other specified coagulation defects: Secondary | ICD-10-CM | POA: Diagnosis not present

## 2016-12-18 DIAGNOSIS — N2581 Secondary hyperparathyroidism of renal origin: Secondary | ICD-10-CM | POA: Diagnosis not present

## 2016-12-18 DIAGNOSIS — N186 End stage renal disease: Secondary | ICD-10-CM | POA: Diagnosis not present

## 2016-12-18 DIAGNOSIS — T8249XA Other complication of vascular dialysis catheter, initial encounter: Secondary | ICD-10-CM | POA: Diagnosis not present

## 2016-12-18 DIAGNOSIS — D631 Anemia in chronic kidney disease: Secondary | ICD-10-CM | POA: Diagnosis not present

## 2016-12-18 DIAGNOSIS — D509 Iron deficiency anemia, unspecified: Secondary | ICD-10-CM | POA: Diagnosis not present

## 2016-12-21 DIAGNOSIS — R51 Headache: Secondary | ICD-10-CM | POA: Diagnosis not present

## 2016-12-21 DIAGNOSIS — D509 Iron deficiency anemia, unspecified: Secondary | ICD-10-CM | POA: Diagnosis not present

## 2016-12-21 DIAGNOSIS — T8249XA Other complication of vascular dialysis catheter, initial encounter: Secondary | ICD-10-CM | POA: Diagnosis not present

## 2016-12-21 DIAGNOSIS — D631 Anemia in chronic kidney disease: Secondary | ICD-10-CM | POA: Diagnosis not present

## 2016-12-21 DIAGNOSIS — E1129 Type 2 diabetes mellitus with other diabetic kidney complication: Secondary | ICD-10-CM | POA: Diagnosis not present

## 2016-12-21 DIAGNOSIS — Z23 Encounter for immunization: Secondary | ICD-10-CM | POA: Diagnosis not present

## 2016-12-21 DIAGNOSIS — D688 Other specified coagulation defects: Secondary | ICD-10-CM | POA: Diagnosis not present

## 2016-12-21 DIAGNOSIS — N186 End stage renal disease: Secondary | ICD-10-CM | POA: Diagnosis not present

## 2016-12-21 DIAGNOSIS — N2581 Secondary hyperparathyroidism of renal origin: Secondary | ICD-10-CM | POA: Diagnosis not present

## 2016-12-23 DIAGNOSIS — D509 Iron deficiency anemia, unspecified: Secondary | ICD-10-CM | POA: Diagnosis not present

## 2016-12-23 DIAGNOSIS — R51 Headache: Secondary | ICD-10-CM | POA: Diagnosis not present

## 2016-12-23 DIAGNOSIS — D631 Anemia in chronic kidney disease: Secondary | ICD-10-CM | POA: Diagnosis not present

## 2016-12-23 DIAGNOSIS — N186 End stage renal disease: Secondary | ICD-10-CM | POA: Diagnosis not present

## 2016-12-23 DIAGNOSIS — Z23 Encounter for immunization: Secondary | ICD-10-CM | POA: Diagnosis not present

## 2016-12-23 DIAGNOSIS — D688 Other specified coagulation defects: Secondary | ICD-10-CM | POA: Diagnosis not present

## 2016-12-23 DIAGNOSIS — E1129 Type 2 diabetes mellitus with other diabetic kidney complication: Secondary | ICD-10-CM | POA: Diagnosis not present

## 2016-12-23 DIAGNOSIS — T8249XA Other complication of vascular dialysis catheter, initial encounter: Secondary | ICD-10-CM | POA: Diagnosis not present

## 2016-12-23 DIAGNOSIS — N2581 Secondary hyperparathyroidism of renal origin: Secondary | ICD-10-CM | POA: Diagnosis not present

## 2016-12-25 DIAGNOSIS — N2581 Secondary hyperparathyroidism of renal origin: Secondary | ICD-10-CM | POA: Diagnosis not present

## 2016-12-25 DIAGNOSIS — E1129 Type 2 diabetes mellitus with other diabetic kidney complication: Secondary | ICD-10-CM | POA: Diagnosis not present

## 2016-12-25 DIAGNOSIS — N186 End stage renal disease: Secondary | ICD-10-CM | POA: Diagnosis not present

## 2016-12-25 DIAGNOSIS — T8249XA Other complication of vascular dialysis catheter, initial encounter: Secondary | ICD-10-CM | POA: Diagnosis not present

## 2016-12-25 DIAGNOSIS — D688 Other specified coagulation defects: Secondary | ICD-10-CM | POA: Diagnosis not present

## 2016-12-25 DIAGNOSIS — R51 Headache: Secondary | ICD-10-CM | POA: Diagnosis not present

## 2016-12-25 DIAGNOSIS — D509 Iron deficiency anemia, unspecified: Secondary | ICD-10-CM | POA: Diagnosis not present

## 2016-12-25 DIAGNOSIS — D631 Anemia in chronic kidney disease: Secondary | ICD-10-CM | POA: Diagnosis not present

## 2016-12-25 DIAGNOSIS — Z23 Encounter for immunization: Secondary | ICD-10-CM | POA: Diagnosis not present

## 2016-12-28 DIAGNOSIS — E1129 Type 2 diabetes mellitus with other diabetic kidney complication: Secondary | ICD-10-CM | POA: Diagnosis not present

## 2016-12-28 DIAGNOSIS — R51 Headache: Secondary | ICD-10-CM | POA: Diagnosis not present

## 2016-12-28 DIAGNOSIS — T8249XA Other complication of vascular dialysis catheter, initial encounter: Secondary | ICD-10-CM | POA: Diagnosis not present

## 2016-12-28 DIAGNOSIS — Z23 Encounter for immunization: Secondary | ICD-10-CM | POA: Diagnosis not present

## 2016-12-28 DIAGNOSIS — N2581 Secondary hyperparathyroidism of renal origin: Secondary | ICD-10-CM | POA: Diagnosis not present

## 2016-12-28 DIAGNOSIS — D509 Iron deficiency anemia, unspecified: Secondary | ICD-10-CM | POA: Diagnosis not present

## 2016-12-28 DIAGNOSIS — N186 End stage renal disease: Secondary | ICD-10-CM | POA: Diagnosis not present

## 2016-12-28 DIAGNOSIS — D688 Other specified coagulation defects: Secondary | ICD-10-CM | POA: Diagnosis not present

## 2016-12-28 DIAGNOSIS — D631 Anemia in chronic kidney disease: Secondary | ICD-10-CM | POA: Diagnosis not present

## 2016-12-30 DIAGNOSIS — D509 Iron deficiency anemia, unspecified: Secondary | ICD-10-CM | POA: Diagnosis not present

## 2016-12-30 DIAGNOSIS — Z23 Encounter for immunization: Secondary | ICD-10-CM | POA: Diagnosis not present

## 2016-12-30 DIAGNOSIS — T8249XA Other complication of vascular dialysis catheter, initial encounter: Secondary | ICD-10-CM | POA: Diagnosis not present

## 2016-12-30 DIAGNOSIS — N186 End stage renal disease: Secondary | ICD-10-CM | POA: Diagnosis not present

## 2016-12-30 DIAGNOSIS — D688 Other specified coagulation defects: Secondary | ICD-10-CM | POA: Diagnosis not present

## 2016-12-30 DIAGNOSIS — E1129 Type 2 diabetes mellitus with other diabetic kidney complication: Secondary | ICD-10-CM | POA: Diagnosis not present

## 2016-12-30 DIAGNOSIS — R51 Headache: Secondary | ICD-10-CM | POA: Diagnosis not present

## 2016-12-30 DIAGNOSIS — D631 Anemia in chronic kidney disease: Secondary | ICD-10-CM | POA: Diagnosis not present

## 2016-12-30 DIAGNOSIS — N2581 Secondary hyperparathyroidism of renal origin: Secondary | ICD-10-CM | POA: Diagnosis not present

## 2017-01-01 DIAGNOSIS — R51 Headache: Secondary | ICD-10-CM | POA: Diagnosis not present

## 2017-01-01 DIAGNOSIS — N186 End stage renal disease: Secondary | ICD-10-CM | POA: Diagnosis not present

## 2017-01-01 DIAGNOSIS — E1129 Type 2 diabetes mellitus with other diabetic kidney complication: Secondary | ICD-10-CM | POA: Diagnosis not present

## 2017-01-01 DIAGNOSIS — D688 Other specified coagulation defects: Secondary | ICD-10-CM | POA: Diagnosis not present

## 2017-01-01 DIAGNOSIS — D631 Anemia in chronic kidney disease: Secondary | ICD-10-CM | POA: Diagnosis not present

## 2017-01-01 DIAGNOSIS — D509 Iron deficiency anemia, unspecified: Secondary | ICD-10-CM | POA: Diagnosis not present

## 2017-01-01 DIAGNOSIS — N2581 Secondary hyperparathyroidism of renal origin: Secondary | ICD-10-CM | POA: Diagnosis not present

## 2017-01-01 DIAGNOSIS — Z23 Encounter for immunization: Secondary | ICD-10-CM | POA: Diagnosis not present

## 2017-01-01 DIAGNOSIS — T8249XA Other complication of vascular dialysis catheter, initial encounter: Secondary | ICD-10-CM | POA: Diagnosis not present

## 2017-01-04 DIAGNOSIS — N2581 Secondary hyperparathyroidism of renal origin: Secondary | ICD-10-CM | POA: Diagnosis not present

## 2017-01-04 DIAGNOSIS — D688 Other specified coagulation defects: Secondary | ICD-10-CM | POA: Diagnosis not present

## 2017-01-04 DIAGNOSIS — D631 Anemia in chronic kidney disease: Secondary | ICD-10-CM | POA: Diagnosis not present

## 2017-01-04 DIAGNOSIS — E1129 Type 2 diabetes mellitus with other diabetic kidney complication: Secondary | ICD-10-CM | POA: Diagnosis not present

## 2017-01-04 DIAGNOSIS — D509 Iron deficiency anemia, unspecified: Secondary | ICD-10-CM | POA: Diagnosis not present

## 2017-01-04 DIAGNOSIS — N186 End stage renal disease: Secondary | ICD-10-CM | POA: Diagnosis not present

## 2017-01-04 DIAGNOSIS — T8249XA Other complication of vascular dialysis catheter, initial encounter: Secondary | ICD-10-CM | POA: Diagnosis not present

## 2017-01-04 DIAGNOSIS — Z23 Encounter for immunization: Secondary | ICD-10-CM | POA: Diagnosis not present

## 2017-01-04 DIAGNOSIS — R51 Headache: Secondary | ICD-10-CM | POA: Diagnosis not present

## 2017-01-06 DIAGNOSIS — D631 Anemia in chronic kidney disease: Secondary | ICD-10-CM | POA: Diagnosis not present

## 2017-01-06 DIAGNOSIS — Z23 Encounter for immunization: Secondary | ICD-10-CM | POA: Diagnosis not present

## 2017-01-06 DIAGNOSIS — E1129 Type 2 diabetes mellitus with other diabetic kidney complication: Secondary | ICD-10-CM | POA: Diagnosis not present

## 2017-01-06 DIAGNOSIS — N2581 Secondary hyperparathyroidism of renal origin: Secondary | ICD-10-CM | POA: Diagnosis not present

## 2017-01-06 DIAGNOSIS — D509 Iron deficiency anemia, unspecified: Secondary | ICD-10-CM | POA: Diagnosis not present

## 2017-01-06 DIAGNOSIS — T8249XA Other complication of vascular dialysis catheter, initial encounter: Secondary | ICD-10-CM | POA: Diagnosis not present

## 2017-01-06 DIAGNOSIS — D688 Other specified coagulation defects: Secondary | ICD-10-CM | POA: Diagnosis not present

## 2017-01-06 DIAGNOSIS — R51 Headache: Secondary | ICD-10-CM | POA: Diagnosis not present

## 2017-01-06 DIAGNOSIS — N186 End stage renal disease: Secondary | ICD-10-CM | POA: Diagnosis not present

## 2017-01-08 DIAGNOSIS — N186 End stage renal disease: Secondary | ICD-10-CM | POA: Diagnosis not present

## 2017-01-08 DIAGNOSIS — R51 Headache: Secondary | ICD-10-CM | POA: Diagnosis not present

## 2017-01-08 DIAGNOSIS — T8249XA Other complication of vascular dialysis catheter, initial encounter: Secondary | ICD-10-CM | POA: Diagnosis not present

## 2017-01-08 DIAGNOSIS — E1129 Type 2 diabetes mellitus with other diabetic kidney complication: Secondary | ICD-10-CM | POA: Diagnosis not present

## 2017-01-08 DIAGNOSIS — D688 Other specified coagulation defects: Secondary | ICD-10-CM | POA: Diagnosis not present

## 2017-01-08 DIAGNOSIS — D509 Iron deficiency anemia, unspecified: Secondary | ICD-10-CM | POA: Diagnosis not present

## 2017-01-08 DIAGNOSIS — N2581 Secondary hyperparathyroidism of renal origin: Secondary | ICD-10-CM | POA: Diagnosis not present

## 2017-01-08 DIAGNOSIS — Z23 Encounter for immunization: Secondary | ICD-10-CM | POA: Diagnosis not present

## 2017-01-08 DIAGNOSIS — D631 Anemia in chronic kidney disease: Secondary | ICD-10-CM | POA: Diagnosis not present

## 2017-01-11 DIAGNOSIS — D688 Other specified coagulation defects: Secondary | ICD-10-CM | POA: Diagnosis not present

## 2017-01-11 DIAGNOSIS — T8249XA Other complication of vascular dialysis catheter, initial encounter: Secondary | ICD-10-CM | POA: Diagnosis not present

## 2017-01-11 DIAGNOSIS — N2581 Secondary hyperparathyroidism of renal origin: Secondary | ICD-10-CM | POA: Diagnosis not present

## 2017-01-11 DIAGNOSIS — E1129 Type 2 diabetes mellitus with other diabetic kidney complication: Secondary | ICD-10-CM | POA: Diagnosis not present

## 2017-01-11 DIAGNOSIS — D509 Iron deficiency anemia, unspecified: Secondary | ICD-10-CM | POA: Diagnosis not present

## 2017-01-11 DIAGNOSIS — N186 End stage renal disease: Secondary | ICD-10-CM | POA: Diagnosis not present

## 2017-01-11 DIAGNOSIS — Z23 Encounter for immunization: Secondary | ICD-10-CM | POA: Diagnosis not present

## 2017-01-11 DIAGNOSIS — R51 Headache: Secondary | ICD-10-CM | POA: Diagnosis not present

## 2017-01-11 DIAGNOSIS — D631 Anemia in chronic kidney disease: Secondary | ICD-10-CM | POA: Diagnosis not present

## 2017-01-13 DIAGNOSIS — E1129 Type 2 diabetes mellitus with other diabetic kidney complication: Secondary | ICD-10-CM | POA: Diagnosis not present

## 2017-01-13 DIAGNOSIS — D631 Anemia in chronic kidney disease: Secondary | ICD-10-CM | POA: Diagnosis not present

## 2017-01-13 DIAGNOSIS — D509 Iron deficiency anemia, unspecified: Secondary | ICD-10-CM | POA: Diagnosis not present

## 2017-01-13 DIAGNOSIS — T8249XA Other complication of vascular dialysis catheter, initial encounter: Secondary | ICD-10-CM | POA: Diagnosis not present

## 2017-01-13 DIAGNOSIS — Z23 Encounter for immunization: Secondary | ICD-10-CM | POA: Diagnosis not present

## 2017-01-13 DIAGNOSIS — N2581 Secondary hyperparathyroidism of renal origin: Secondary | ICD-10-CM | POA: Diagnosis not present

## 2017-01-13 DIAGNOSIS — N186 End stage renal disease: Secondary | ICD-10-CM | POA: Diagnosis not present

## 2017-01-13 DIAGNOSIS — D688 Other specified coagulation defects: Secondary | ICD-10-CM | POA: Diagnosis not present

## 2017-01-13 DIAGNOSIS — R51 Headache: Secondary | ICD-10-CM | POA: Diagnosis not present

## 2017-01-14 ENCOUNTER — Ambulatory Visit (INDEPENDENT_AMBULATORY_CARE_PROVIDER_SITE_OTHER): Payer: Medicare Other | Admitting: Podiatry

## 2017-01-14 ENCOUNTER — Encounter: Payer: Self-pay | Admitting: Podiatry

## 2017-01-14 DIAGNOSIS — E1151 Type 2 diabetes mellitus with diabetic peripheral angiopathy without gangrene: Secondary | ICD-10-CM

## 2017-01-14 DIAGNOSIS — Z794 Long term (current) use of insulin: Secondary | ICD-10-CM

## 2017-01-14 DIAGNOSIS — B351 Tinea unguium: Secondary | ICD-10-CM

## 2017-01-14 DIAGNOSIS — M79609 Pain in unspecified limb: Secondary | ICD-10-CM | POA: Diagnosis not present

## 2017-01-14 NOTE — Progress Notes (Signed)
Patient ID: Rachel Hobbs, female   DOB: [birth year], 76 y.o.   MRN: [id number] HPI  Complaint:  Visit Type: Patient returns to my office for continued preventative foot care services. Complaint: Patient states" my nails have grown long and thick and become painful to walk and wear shoes" Patient has been diagnosed with DM . This patient  presents for preventative foot care services. No changes to ROS  Podiatric Exam: Vascular: dorsalis pedis and posterior tibial pulses are non palpable due to swelling both feet.. Capillary return is slow to refill. Temperature gradient is negative. Skin turgor WNL, bilateral swelling  Sensorium: Diminished  Semmes Weinstein monofilament test. Normal tactile sensation bilaterally.  Nail Exam: Pt has thick disfigured discolored nails with subungual debris noted bilateral entire nail hallux through fifth toenails Ulcer Exam: There is no evidence of ulcer or pre-ulcerative changes or infection. Orthopedic Exam: Muscle tone and strength are WNL. No limitations in general ROM. No crepitus or effusions noted. Foot type and digits show no abnormalities.  HAV  B/L Skin: No Porokeratosis. No infection or ulcers.  Blackened circular lesions 1st MPJ left and heel medial aspect left.  Diagnosis:  Onychomycosis, Pain in right toe, pain in left toes  Treatment & Plan Procedures and Treatment: Consent by patient was obtained for treatment procedures. The patient understood the discussion of treatment and procedures well. All questions were answered thoroughly reviewed. Debridement of mycotic and hypertrophic toenails, 1 through 5 bilateral and clearing of subungual debris. No ulceration, no infection noted.  Return Visit-Office Procedure: Patient instructed to return to the office for a follow up visit 3 months for continued evaluation and treatment.   Gardiner Barefoot DPM

## 2017-01-15 DIAGNOSIS — E1129 Type 2 diabetes mellitus with other diabetic kidney complication: Secondary | ICD-10-CM | POA: Diagnosis not present

## 2017-01-15 DIAGNOSIS — D688 Other specified coagulation defects: Secondary | ICD-10-CM | POA: Diagnosis not present

## 2017-01-15 DIAGNOSIS — T8249XA Other complication of vascular dialysis catheter, initial encounter: Secondary | ICD-10-CM | POA: Diagnosis not present

## 2017-01-15 DIAGNOSIS — N186 End stage renal disease: Secondary | ICD-10-CM | POA: Diagnosis not present

## 2017-01-15 DIAGNOSIS — N2581 Secondary hyperparathyroidism of renal origin: Secondary | ICD-10-CM | POA: Diagnosis not present

## 2017-01-15 DIAGNOSIS — R51 Headache: Secondary | ICD-10-CM | POA: Diagnosis not present

## 2017-01-15 DIAGNOSIS — Z23 Encounter for immunization: Secondary | ICD-10-CM | POA: Diagnosis not present

## 2017-01-15 DIAGNOSIS — D509 Iron deficiency anemia, unspecified: Secondary | ICD-10-CM | POA: Diagnosis not present

## 2017-01-15 DIAGNOSIS — D631 Anemia in chronic kidney disease: Secondary | ICD-10-CM | POA: Diagnosis not present

## 2017-01-16 DIAGNOSIS — N186 End stage renal disease: Secondary | ICD-10-CM | POA: Diagnosis not present

## 2017-01-16 DIAGNOSIS — E1129 Type 2 diabetes mellitus with other diabetic kidney complication: Secondary | ICD-10-CM | POA: Diagnosis not present

## 2017-01-16 DIAGNOSIS — Z992 Dependence on renal dialysis: Secondary | ICD-10-CM | POA: Diagnosis not present

## 2017-01-18 DIAGNOSIS — L299 Pruritus, unspecified: Secondary | ICD-10-CM | POA: Diagnosis not present

## 2017-01-18 DIAGNOSIS — D631 Anemia in chronic kidney disease: Secondary | ICD-10-CM | POA: Diagnosis not present

## 2017-01-18 DIAGNOSIS — N2581 Secondary hyperparathyroidism of renal origin: Secondary | ICD-10-CM | POA: Diagnosis not present

## 2017-01-18 DIAGNOSIS — E1129 Type 2 diabetes mellitus with other diabetic kidney complication: Secondary | ICD-10-CM | POA: Diagnosis not present

## 2017-01-18 DIAGNOSIS — D688 Other specified coagulation defects: Secondary | ICD-10-CM | POA: Diagnosis not present

## 2017-01-18 DIAGNOSIS — R51 Headache: Secondary | ICD-10-CM | POA: Diagnosis not present

## 2017-01-18 DIAGNOSIS — T8249XA Other complication of vascular dialysis catheter, initial encounter: Secondary | ICD-10-CM | POA: Diagnosis not present

## 2017-01-18 DIAGNOSIS — D509 Iron deficiency anemia, unspecified: Secondary | ICD-10-CM | POA: Diagnosis not present

## 2017-01-18 DIAGNOSIS — N186 End stage renal disease: Secondary | ICD-10-CM | POA: Diagnosis not present

## 2017-01-20 DIAGNOSIS — R51 Headache: Secondary | ICD-10-CM | POA: Diagnosis not present

## 2017-01-20 DIAGNOSIS — N2581 Secondary hyperparathyroidism of renal origin: Secondary | ICD-10-CM | POA: Diagnosis not present

## 2017-01-20 DIAGNOSIS — L299 Pruritus, unspecified: Secondary | ICD-10-CM | POA: Diagnosis not present

## 2017-01-20 DIAGNOSIS — D631 Anemia in chronic kidney disease: Secondary | ICD-10-CM | POA: Diagnosis not present

## 2017-01-20 DIAGNOSIS — D688 Other specified coagulation defects: Secondary | ICD-10-CM | POA: Diagnosis not present

## 2017-01-20 DIAGNOSIS — N186 End stage renal disease: Secondary | ICD-10-CM | POA: Diagnosis not present

## 2017-01-20 DIAGNOSIS — E1129 Type 2 diabetes mellitus with other diabetic kidney complication: Secondary | ICD-10-CM | POA: Diagnosis not present

## 2017-01-20 DIAGNOSIS — D509 Iron deficiency anemia, unspecified: Secondary | ICD-10-CM | POA: Diagnosis not present

## 2017-01-20 DIAGNOSIS — T8249XA Other complication of vascular dialysis catheter, initial encounter: Secondary | ICD-10-CM | POA: Diagnosis not present

## 2017-01-22 DIAGNOSIS — N2581 Secondary hyperparathyroidism of renal origin: Secondary | ICD-10-CM | POA: Diagnosis not present

## 2017-01-22 DIAGNOSIS — L299 Pruritus, unspecified: Secondary | ICD-10-CM | POA: Diagnosis not present

## 2017-01-22 DIAGNOSIS — E1129 Type 2 diabetes mellitus with other diabetic kidney complication: Secondary | ICD-10-CM | POA: Diagnosis not present

## 2017-01-22 DIAGNOSIS — R51 Headache: Secondary | ICD-10-CM | POA: Diagnosis not present

## 2017-01-22 DIAGNOSIS — D631 Anemia in chronic kidney disease: Secondary | ICD-10-CM | POA: Diagnosis not present

## 2017-01-22 DIAGNOSIS — D688 Other specified coagulation defects: Secondary | ICD-10-CM | POA: Diagnosis not present

## 2017-01-22 DIAGNOSIS — N186 End stage renal disease: Secondary | ICD-10-CM | POA: Diagnosis not present

## 2017-01-22 DIAGNOSIS — T8249XA Other complication of vascular dialysis catheter, initial encounter: Secondary | ICD-10-CM | POA: Diagnosis not present

## 2017-01-22 DIAGNOSIS — D509 Iron deficiency anemia, unspecified: Secondary | ICD-10-CM | POA: Diagnosis not present

## 2017-01-25 DIAGNOSIS — L299 Pruritus, unspecified: Secondary | ICD-10-CM | POA: Diagnosis not present

## 2017-01-25 DIAGNOSIS — D631 Anemia in chronic kidney disease: Secondary | ICD-10-CM | POA: Diagnosis not present

## 2017-01-25 DIAGNOSIS — N186 End stage renal disease: Secondary | ICD-10-CM | POA: Diagnosis not present

## 2017-01-25 DIAGNOSIS — R51 Headache: Secondary | ICD-10-CM | POA: Diagnosis not present

## 2017-01-25 DIAGNOSIS — T8249XA Other complication of vascular dialysis catheter, initial encounter: Secondary | ICD-10-CM | POA: Diagnosis not present

## 2017-01-25 DIAGNOSIS — D688 Other specified coagulation defects: Secondary | ICD-10-CM | POA: Diagnosis not present

## 2017-01-25 DIAGNOSIS — E1129 Type 2 diabetes mellitus with other diabetic kidney complication: Secondary | ICD-10-CM | POA: Diagnosis not present

## 2017-01-25 DIAGNOSIS — N2581 Secondary hyperparathyroidism of renal origin: Secondary | ICD-10-CM | POA: Diagnosis not present

## 2017-01-25 DIAGNOSIS — D509 Iron deficiency anemia, unspecified: Secondary | ICD-10-CM | POA: Diagnosis not present

## 2017-01-27 DIAGNOSIS — D688 Other specified coagulation defects: Secondary | ICD-10-CM | POA: Diagnosis not present

## 2017-01-27 DIAGNOSIS — L299 Pruritus, unspecified: Secondary | ICD-10-CM | POA: Diagnosis not present

## 2017-01-27 DIAGNOSIS — D509 Iron deficiency anemia, unspecified: Secondary | ICD-10-CM | POA: Diagnosis not present

## 2017-01-27 DIAGNOSIS — D631 Anemia in chronic kidney disease: Secondary | ICD-10-CM | POA: Diagnosis not present

## 2017-01-27 DIAGNOSIS — N2581 Secondary hyperparathyroidism of renal origin: Secondary | ICD-10-CM | POA: Diagnosis not present

## 2017-01-27 DIAGNOSIS — N186 End stage renal disease: Secondary | ICD-10-CM | POA: Diagnosis not present

## 2017-01-27 DIAGNOSIS — R51 Headache: Secondary | ICD-10-CM | POA: Diagnosis not present

## 2017-01-27 DIAGNOSIS — E1129 Type 2 diabetes mellitus with other diabetic kidney complication: Secondary | ICD-10-CM | POA: Diagnosis not present

## 2017-01-27 DIAGNOSIS — T8249XA Other complication of vascular dialysis catheter, initial encounter: Secondary | ICD-10-CM | POA: Diagnosis not present

## 2017-01-29 DIAGNOSIS — T8249XA Other complication of vascular dialysis catheter, initial encounter: Secondary | ICD-10-CM | POA: Diagnosis not present

## 2017-01-29 DIAGNOSIS — D509 Iron deficiency anemia, unspecified: Secondary | ICD-10-CM | POA: Diagnosis not present

## 2017-01-29 DIAGNOSIS — D631 Anemia in chronic kidney disease: Secondary | ICD-10-CM | POA: Diagnosis not present

## 2017-01-29 DIAGNOSIS — R51 Headache: Secondary | ICD-10-CM | POA: Diagnosis not present

## 2017-01-29 DIAGNOSIS — L299 Pruritus, unspecified: Secondary | ICD-10-CM | POA: Diagnosis not present

## 2017-01-29 DIAGNOSIS — E1129 Type 2 diabetes mellitus with other diabetic kidney complication: Secondary | ICD-10-CM | POA: Diagnosis not present

## 2017-01-29 DIAGNOSIS — N186 End stage renal disease: Secondary | ICD-10-CM | POA: Diagnosis not present

## 2017-01-29 DIAGNOSIS — N2581 Secondary hyperparathyroidism of renal origin: Secondary | ICD-10-CM | POA: Diagnosis not present

## 2017-01-29 DIAGNOSIS — D688 Other specified coagulation defects: Secondary | ICD-10-CM | POA: Diagnosis not present

## 2017-02-01 DIAGNOSIS — L299 Pruritus, unspecified: Secondary | ICD-10-CM | POA: Diagnosis not present

## 2017-02-01 DIAGNOSIS — N186 End stage renal disease: Secondary | ICD-10-CM | POA: Diagnosis not present

## 2017-02-01 DIAGNOSIS — N2581 Secondary hyperparathyroidism of renal origin: Secondary | ICD-10-CM | POA: Diagnosis not present

## 2017-02-01 DIAGNOSIS — D631 Anemia in chronic kidney disease: Secondary | ICD-10-CM | POA: Diagnosis not present

## 2017-02-01 DIAGNOSIS — T8249XA Other complication of vascular dialysis catheter, initial encounter: Secondary | ICD-10-CM | POA: Diagnosis not present

## 2017-02-01 DIAGNOSIS — D688 Other specified coagulation defects: Secondary | ICD-10-CM | POA: Diagnosis not present

## 2017-02-01 DIAGNOSIS — D509 Iron deficiency anemia, unspecified: Secondary | ICD-10-CM | POA: Diagnosis not present

## 2017-02-01 DIAGNOSIS — E1129 Type 2 diabetes mellitus with other diabetic kidney complication: Secondary | ICD-10-CM | POA: Diagnosis not present

## 2017-02-01 DIAGNOSIS — R51 Headache: Secondary | ICD-10-CM | POA: Diagnosis not present

## 2017-02-03 DIAGNOSIS — T8249XA Other complication of vascular dialysis catheter, initial encounter: Secondary | ICD-10-CM | POA: Diagnosis not present

## 2017-02-03 DIAGNOSIS — N2581 Secondary hyperparathyroidism of renal origin: Secondary | ICD-10-CM | POA: Diagnosis not present

## 2017-02-03 DIAGNOSIS — E1129 Type 2 diabetes mellitus with other diabetic kidney complication: Secondary | ICD-10-CM | POA: Diagnosis not present

## 2017-02-03 DIAGNOSIS — D509 Iron deficiency anemia, unspecified: Secondary | ICD-10-CM | POA: Diagnosis not present

## 2017-02-03 DIAGNOSIS — L299 Pruritus, unspecified: Secondary | ICD-10-CM | POA: Diagnosis not present

## 2017-02-03 DIAGNOSIS — R51 Headache: Secondary | ICD-10-CM | POA: Diagnosis not present

## 2017-02-03 DIAGNOSIS — D688 Other specified coagulation defects: Secondary | ICD-10-CM | POA: Diagnosis not present

## 2017-02-03 DIAGNOSIS — D631 Anemia in chronic kidney disease: Secondary | ICD-10-CM | POA: Diagnosis not present

## 2017-02-03 DIAGNOSIS — N186 End stage renal disease: Secondary | ICD-10-CM | POA: Diagnosis not present

## 2017-02-04 ENCOUNTER — Other Ambulatory Visit: Payer: Self-pay | Admitting: Vascular Surgery

## 2017-02-05 DIAGNOSIS — R51 Headache: Secondary | ICD-10-CM | POA: Diagnosis not present

## 2017-02-05 DIAGNOSIS — D631 Anemia in chronic kidney disease: Secondary | ICD-10-CM | POA: Diagnosis not present

## 2017-02-05 DIAGNOSIS — T8249XA Other complication of vascular dialysis catheter, initial encounter: Secondary | ICD-10-CM | POA: Diagnosis not present

## 2017-02-05 DIAGNOSIS — E1129 Type 2 diabetes mellitus with other diabetic kidney complication: Secondary | ICD-10-CM | POA: Diagnosis not present

## 2017-02-05 DIAGNOSIS — N186 End stage renal disease: Secondary | ICD-10-CM | POA: Diagnosis not present

## 2017-02-05 DIAGNOSIS — D509 Iron deficiency anemia, unspecified: Secondary | ICD-10-CM | POA: Diagnosis not present

## 2017-02-05 DIAGNOSIS — L299 Pruritus, unspecified: Secondary | ICD-10-CM | POA: Diagnosis not present

## 2017-02-05 DIAGNOSIS — N2581 Secondary hyperparathyroidism of renal origin: Secondary | ICD-10-CM | POA: Diagnosis not present

## 2017-02-05 DIAGNOSIS — D688 Other specified coagulation defects: Secondary | ICD-10-CM | POA: Diagnosis not present

## 2017-02-07 DIAGNOSIS — Z452 Encounter for adjustment and management of vascular access device: Secondary | ICD-10-CM | POA: Diagnosis not present

## 2017-02-08 DIAGNOSIS — D631 Anemia in chronic kidney disease: Secondary | ICD-10-CM | POA: Diagnosis not present

## 2017-02-08 DIAGNOSIS — R51 Headache: Secondary | ICD-10-CM | POA: Diagnosis not present

## 2017-02-08 DIAGNOSIS — N186 End stage renal disease: Secondary | ICD-10-CM | POA: Diagnosis not present

## 2017-02-08 DIAGNOSIS — L299 Pruritus, unspecified: Secondary | ICD-10-CM | POA: Diagnosis not present

## 2017-02-08 DIAGNOSIS — D688 Other specified coagulation defects: Secondary | ICD-10-CM | POA: Diagnosis not present

## 2017-02-08 DIAGNOSIS — E1129 Type 2 diabetes mellitus with other diabetic kidney complication: Secondary | ICD-10-CM | POA: Diagnosis not present

## 2017-02-08 DIAGNOSIS — N2581 Secondary hyperparathyroidism of renal origin: Secondary | ICD-10-CM | POA: Diagnosis not present

## 2017-02-08 DIAGNOSIS — D509 Iron deficiency anemia, unspecified: Secondary | ICD-10-CM | POA: Diagnosis not present

## 2017-02-08 DIAGNOSIS — T8249XA Other complication of vascular dialysis catheter, initial encounter: Secondary | ICD-10-CM | POA: Diagnosis not present

## 2017-02-10 DIAGNOSIS — T8249XA Other complication of vascular dialysis catheter, initial encounter: Secondary | ICD-10-CM | POA: Diagnosis not present

## 2017-02-10 DIAGNOSIS — D509 Iron deficiency anemia, unspecified: Secondary | ICD-10-CM | POA: Diagnosis not present

## 2017-02-10 DIAGNOSIS — D688 Other specified coagulation defects: Secondary | ICD-10-CM | POA: Diagnosis not present

## 2017-02-10 DIAGNOSIS — L299 Pruritus, unspecified: Secondary | ICD-10-CM | POA: Diagnosis not present

## 2017-02-10 DIAGNOSIS — N2581 Secondary hyperparathyroidism of renal origin: Secondary | ICD-10-CM | POA: Diagnosis not present

## 2017-02-10 DIAGNOSIS — E1129 Type 2 diabetes mellitus with other diabetic kidney complication: Secondary | ICD-10-CM | POA: Diagnosis not present

## 2017-02-10 DIAGNOSIS — N186 End stage renal disease: Secondary | ICD-10-CM | POA: Diagnosis not present

## 2017-02-10 DIAGNOSIS — R51 Headache: Secondary | ICD-10-CM | POA: Diagnosis not present

## 2017-02-10 DIAGNOSIS — D631 Anemia in chronic kidney disease: Secondary | ICD-10-CM | POA: Diagnosis not present

## 2017-02-11 ENCOUNTER — Ambulatory Visit (INDEPENDENT_AMBULATORY_CARE_PROVIDER_SITE_OTHER): Payer: Medicare Other | Admitting: Internal Medicine

## 2017-02-11 ENCOUNTER — Other Ambulatory Visit: Payer: Self-pay | Admitting: Family Medicine

## 2017-02-11 ENCOUNTER — Ambulatory Visit: Payer: Medicare Other | Admitting: Family Medicine

## 2017-02-11 ENCOUNTER — Encounter: Payer: Self-pay | Admitting: Internal Medicine

## 2017-02-11 DIAGNOSIS — L439 Lichen planus, unspecified: Secondary | ICD-10-CM | POA: Insufficient documentation

## 2017-02-11 DIAGNOSIS — R21 Rash and other nonspecific skin eruption: Secondary | ICD-10-CM

## 2017-02-11 DIAGNOSIS — E118 Type 2 diabetes mellitus with unspecified complications: Secondary | ICD-10-CM

## 2017-02-11 MED ORDER — TRIAMCINOLONE ACETONIDE 0.1 % EX OINT: 1.0000 "application " | TOPICAL_OINTMENT | Freq: Two times a day (BID) | CUTANEOUS | 0 refills | Status: DC

## 2017-02-11 NOTE — Patient Instructions (Addendum)
Have a rash called lichen planus. It is a very itchy rash. I want you to use kenalog ointment on it twice daily. Please follow up in about two weeks if no improvement, we can consider biopsy if we need too.

## 2017-02-11 NOTE — Progress Notes (Signed)
   [facility] Kerrin Mo, MD Phone: [phone]  Reason For Visit: SDA for Rash  # RASH   Onset: started about two weeks - patient states that it is on her back. Patient denies any previous issues with rashes.  Progression: still present, no worsening  New medications/Topical agents: patient used some over the counter hydrocortisone cream.  Family members with rash: None   Nausea/Vomiting: None  Stomach pain: None  SOB: None  Swelling: None  Fevers/Chills: None  Occular/Oral lesions: NOne Malaise, myalgia, and arthralgia: None   Past Medical History Reviewed problem list.  Medications- reviewed and updated No additions to family history Social history- patient is a non- smoker  Objective: BP (!) 118/58   Pulse (!) 103   Temp 98.7 F (37.1 C) (Oral)   Wt 224 lb (101.6 kg)   SpO2 93%   BMI 40.97 kg/m  Gen: NAD, alert, cooperative with exam Skin:  itchy reddish-purple polygon-shaped plaques on back  Neuro: Strength and sensation grossly intact   Assessment/Plan: See problem based a/p  Rash and nonspecific skin eruption Likely Lichen planus based on presentation. Very itchy. Has tried OTC hydrocortisone. Will treat with stronger steriod  - triamcinolone ointment (KENALOG) 0.1 %; Apply 1 application topically 2 (two) times daily.  Dispense: 453.6 g; Refill: 0 - Follow up in two weeks. May need something stronger

## 2017-02-12 DIAGNOSIS — D688 Other specified coagulation defects: Secondary | ICD-10-CM | POA: Diagnosis not present

## 2017-02-12 DIAGNOSIS — N186 End stage renal disease: Secondary | ICD-10-CM | POA: Diagnosis not present

## 2017-02-12 DIAGNOSIS — D509 Iron deficiency anemia, unspecified: Secondary | ICD-10-CM | POA: Diagnosis not present

## 2017-02-12 DIAGNOSIS — R51 Headache: Secondary | ICD-10-CM | POA: Diagnosis not present

## 2017-02-12 DIAGNOSIS — L299 Pruritus, unspecified: Secondary | ICD-10-CM | POA: Diagnosis not present

## 2017-02-12 DIAGNOSIS — N2581 Secondary hyperparathyroidism of renal origin: Secondary | ICD-10-CM | POA: Diagnosis not present

## 2017-02-12 DIAGNOSIS — D631 Anemia in chronic kidney disease: Secondary | ICD-10-CM | POA: Diagnosis not present

## 2017-02-12 DIAGNOSIS — T8249XA Other complication of vascular dialysis catheter, initial encounter: Secondary | ICD-10-CM | POA: Diagnosis not present

## 2017-02-12 DIAGNOSIS — E1129 Type 2 diabetes mellitus with other diabetic kidney complication: Secondary | ICD-10-CM | POA: Diagnosis not present

## 2017-02-15 DIAGNOSIS — N186 End stage renal disease: Secondary | ICD-10-CM | POA: Diagnosis not present

## 2017-02-15 DIAGNOSIS — N2581 Secondary hyperparathyroidism of renal origin: Secondary | ICD-10-CM | POA: Diagnosis not present

## 2017-02-15 DIAGNOSIS — E1129 Type 2 diabetes mellitus with other diabetic kidney complication: Secondary | ICD-10-CM | POA: Diagnosis not present

## 2017-02-15 DIAGNOSIS — D688 Other specified coagulation defects: Secondary | ICD-10-CM | POA: Diagnosis not present

## 2017-02-15 DIAGNOSIS — R51 Headache: Secondary | ICD-10-CM | POA: Diagnosis not present

## 2017-02-15 DIAGNOSIS — T8249XA Other complication of vascular dialysis catheter, initial encounter: Secondary | ICD-10-CM | POA: Diagnosis not present

## 2017-02-15 DIAGNOSIS — D509 Iron deficiency anemia, unspecified: Secondary | ICD-10-CM | POA: Diagnosis not present

## 2017-02-15 DIAGNOSIS — L299 Pruritus, unspecified: Secondary | ICD-10-CM | POA: Diagnosis not present

## 2017-02-15 DIAGNOSIS — R21 Rash and other nonspecific skin eruption: Secondary | ICD-10-CM | POA: Insufficient documentation

## 2017-02-15 DIAGNOSIS — D631 Anemia in chronic kidney disease: Secondary | ICD-10-CM | POA: Diagnosis not present

## 2017-02-15 NOTE — Assessment & Plan Note (Signed)
Likely Lichen planus based on presentation. Very itchy. Has tried OTC hydrocortisone. Will treat with stronger steriod  - triamcinolone ointment (KENALOG) 0.1 %; Apply 1 application topically 2 (two) times daily.  Dispense: 453.6 g; Refill: 0 - Follow up in two weeks. May need something stronger

## 2017-02-16 DIAGNOSIS — E1129 Type 2 diabetes mellitus with other diabetic kidney complication: Secondary | ICD-10-CM | POA: Diagnosis not present

## 2017-02-16 DIAGNOSIS — Z992 Dependence on renal dialysis: Secondary | ICD-10-CM | POA: Diagnosis not present

## 2017-02-16 DIAGNOSIS — N186 End stage renal disease: Secondary | ICD-10-CM | POA: Diagnosis not present

## 2017-02-17 DIAGNOSIS — D688 Other specified coagulation defects: Secondary | ICD-10-CM | POA: Diagnosis not present

## 2017-02-17 DIAGNOSIS — T8249XA Other complication of vascular dialysis catheter, initial encounter: Secondary | ICD-10-CM | POA: Diagnosis not present

## 2017-02-17 DIAGNOSIS — E1129 Type 2 diabetes mellitus with other diabetic kidney complication: Secondary | ICD-10-CM | POA: Diagnosis not present

## 2017-02-17 DIAGNOSIS — D509 Iron deficiency anemia, unspecified: Secondary | ICD-10-CM | POA: Diagnosis not present

## 2017-02-17 DIAGNOSIS — Z23 Encounter for immunization: Secondary | ICD-10-CM | POA: Diagnosis not present

## 2017-02-17 DIAGNOSIS — D631 Anemia in chronic kidney disease: Secondary | ICD-10-CM | POA: Diagnosis not present

## 2017-02-17 DIAGNOSIS — R51 Headache: Secondary | ICD-10-CM | POA: Diagnosis not present

## 2017-02-17 DIAGNOSIS — N186 End stage renal disease: Secondary | ICD-10-CM | POA: Diagnosis not present

## 2017-02-17 DIAGNOSIS — N2581 Secondary hyperparathyroidism of renal origin: Secondary | ICD-10-CM | POA: Diagnosis not present

## 2017-02-18 DIAGNOSIS — N186 End stage renal disease: Secondary | ICD-10-CM | POA: Diagnosis not present

## 2017-02-18 DIAGNOSIS — Z992 Dependence on renal dialysis: Secondary | ICD-10-CM | POA: Diagnosis not present

## 2017-02-18 DIAGNOSIS — I871 Compression of vein: Secondary | ICD-10-CM | POA: Diagnosis not present

## 2017-02-18 DIAGNOSIS — T82858A Stenosis of vascular prosthetic devices, implants and grafts, initial encounter: Secondary | ICD-10-CM | POA: Diagnosis not present

## 2017-02-19 DIAGNOSIS — N2581 Secondary hyperparathyroidism of renal origin: Secondary | ICD-10-CM | POA: Diagnosis not present

## 2017-02-19 DIAGNOSIS — D631 Anemia in chronic kidney disease: Secondary | ICD-10-CM | POA: Diagnosis not present

## 2017-02-19 DIAGNOSIS — N186 End stage renal disease: Secondary | ICD-10-CM | POA: Diagnosis not present

## 2017-02-19 DIAGNOSIS — E1129 Type 2 diabetes mellitus with other diabetic kidney complication: Secondary | ICD-10-CM | POA: Diagnosis not present

## 2017-02-19 DIAGNOSIS — Z23 Encounter for immunization: Secondary | ICD-10-CM | POA: Diagnosis not present

## 2017-02-19 DIAGNOSIS — D509 Iron deficiency anemia, unspecified: Secondary | ICD-10-CM | POA: Diagnosis not present

## 2017-02-19 DIAGNOSIS — T8249XA Other complication of vascular dialysis catheter, initial encounter: Secondary | ICD-10-CM | POA: Diagnosis not present

## 2017-02-19 DIAGNOSIS — R51 Headache: Secondary | ICD-10-CM | POA: Diagnosis not present

## 2017-02-19 DIAGNOSIS — D688 Other specified coagulation defects: Secondary | ICD-10-CM | POA: Diagnosis not present

## 2017-02-22 DIAGNOSIS — Z23 Encounter for immunization: Secondary | ICD-10-CM | POA: Diagnosis not present

## 2017-02-22 DIAGNOSIS — R51 Headache: Secondary | ICD-10-CM | POA: Diagnosis not present

## 2017-02-22 DIAGNOSIS — D688 Other specified coagulation defects: Secondary | ICD-10-CM | POA: Diagnosis not present

## 2017-02-22 DIAGNOSIS — E1129 Type 2 diabetes mellitus with other diabetic kidney complication: Secondary | ICD-10-CM | POA: Diagnosis not present

## 2017-02-22 DIAGNOSIS — N2581 Secondary hyperparathyroidism of renal origin: Secondary | ICD-10-CM | POA: Diagnosis not present

## 2017-02-22 DIAGNOSIS — D631 Anemia in chronic kidney disease: Secondary | ICD-10-CM | POA: Diagnosis not present

## 2017-02-22 DIAGNOSIS — N186 End stage renal disease: Secondary | ICD-10-CM | POA: Diagnosis not present

## 2017-02-22 DIAGNOSIS — T8249XA Other complication of vascular dialysis catheter, initial encounter: Secondary | ICD-10-CM | POA: Diagnosis not present

## 2017-02-22 DIAGNOSIS — D509 Iron deficiency anemia, unspecified: Secondary | ICD-10-CM | POA: Diagnosis not present

## 2017-02-23 DIAGNOSIS — H5203 Hypermetropia, bilateral: Secondary | ICD-10-CM | POA: Diagnosis not present

## 2017-02-23 DIAGNOSIS — H25013 Cortical age-related cataract, bilateral: Secondary | ICD-10-CM | POA: Diagnosis not present

## 2017-02-23 DIAGNOSIS — E119 Type 2 diabetes mellitus without complications: Secondary | ICD-10-CM | POA: Diagnosis not present

## 2017-02-24 DIAGNOSIS — E1129 Type 2 diabetes mellitus with other diabetic kidney complication: Secondary | ICD-10-CM | POA: Diagnosis not present

## 2017-02-24 DIAGNOSIS — D631 Anemia in chronic kidney disease: Secondary | ICD-10-CM | POA: Diagnosis not present

## 2017-02-24 DIAGNOSIS — T8249XA Other complication of vascular dialysis catheter, initial encounter: Secondary | ICD-10-CM | POA: Diagnosis not present

## 2017-02-24 DIAGNOSIS — N2581 Secondary hyperparathyroidism of renal origin: Secondary | ICD-10-CM | POA: Diagnosis not present

## 2017-02-24 DIAGNOSIS — R51 Headache: Secondary | ICD-10-CM | POA: Diagnosis not present

## 2017-02-24 DIAGNOSIS — D688 Other specified coagulation defects: Secondary | ICD-10-CM | POA: Diagnosis not present

## 2017-02-24 DIAGNOSIS — Z23 Encounter for immunization: Secondary | ICD-10-CM | POA: Diagnosis not present

## 2017-02-24 DIAGNOSIS — N186 End stage renal disease: Secondary | ICD-10-CM | POA: Diagnosis not present

## 2017-02-24 DIAGNOSIS — D509 Iron deficiency anemia, unspecified: Secondary | ICD-10-CM | POA: Diagnosis not present

## 2017-02-24 LAB — HM DIABETES EYE EXAM

## 2017-02-26 DIAGNOSIS — D631 Anemia in chronic kidney disease: Secondary | ICD-10-CM | POA: Diagnosis not present

## 2017-02-26 DIAGNOSIS — D509 Iron deficiency anemia, unspecified: Secondary | ICD-10-CM | POA: Diagnosis not present

## 2017-02-26 DIAGNOSIS — N186 End stage renal disease: Secondary | ICD-10-CM | POA: Diagnosis not present

## 2017-02-26 DIAGNOSIS — E1129 Type 2 diabetes mellitus with other diabetic kidney complication: Secondary | ICD-10-CM | POA: Diagnosis not present

## 2017-02-26 DIAGNOSIS — N2581 Secondary hyperparathyroidism of renal origin: Secondary | ICD-10-CM | POA: Diagnosis not present

## 2017-02-26 DIAGNOSIS — R51 Headache: Secondary | ICD-10-CM | POA: Diagnosis not present

## 2017-02-26 DIAGNOSIS — T8249XA Other complication of vascular dialysis catheter, initial encounter: Secondary | ICD-10-CM | POA: Diagnosis not present

## 2017-02-26 DIAGNOSIS — Z23 Encounter for immunization: Secondary | ICD-10-CM | POA: Diagnosis not present

## 2017-02-26 DIAGNOSIS — D688 Other specified coagulation defects: Secondary | ICD-10-CM | POA: Diagnosis not present

## 2017-03-01 DIAGNOSIS — Z23 Encounter for immunization: Secondary | ICD-10-CM | POA: Diagnosis not present

## 2017-03-01 DIAGNOSIS — D509 Iron deficiency anemia, unspecified: Secondary | ICD-10-CM | POA: Diagnosis not present

## 2017-03-01 DIAGNOSIS — T8249XA Other complication of vascular dialysis catheter, initial encounter: Secondary | ICD-10-CM | POA: Diagnosis not present

## 2017-03-01 DIAGNOSIS — R51 Headache: Secondary | ICD-10-CM | POA: Diagnosis not present

## 2017-03-01 DIAGNOSIS — E1129 Type 2 diabetes mellitus with other diabetic kidney complication: Secondary | ICD-10-CM | POA: Diagnosis not present

## 2017-03-01 DIAGNOSIS — D631 Anemia in chronic kidney disease: Secondary | ICD-10-CM | POA: Diagnosis not present

## 2017-03-01 DIAGNOSIS — D688 Other specified coagulation defects: Secondary | ICD-10-CM | POA: Diagnosis not present

## 2017-03-01 DIAGNOSIS — N186 End stage renal disease: Secondary | ICD-10-CM | POA: Diagnosis not present

## 2017-03-01 DIAGNOSIS — N2581 Secondary hyperparathyroidism of renal origin: Secondary | ICD-10-CM | POA: Diagnosis not present

## 2017-03-03 DIAGNOSIS — N186 End stage renal disease: Secondary | ICD-10-CM | POA: Diagnosis not present

## 2017-03-03 DIAGNOSIS — Z23 Encounter for immunization: Secondary | ICD-10-CM | POA: Diagnosis not present

## 2017-03-03 DIAGNOSIS — T8249XA Other complication of vascular dialysis catheter, initial encounter: Secondary | ICD-10-CM | POA: Diagnosis not present

## 2017-03-03 DIAGNOSIS — E1129 Type 2 diabetes mellitus with other diabetic kidney complication: Secondary | ICD-10-CM | POA: Diagnosis not present

## 2017-03-03 DIAGNOSIS — D509 Iron deficiency anemia, unspecified: Secondary | ICD-10-CM | POA: Diagnosis not present

## 2017-03-03 DIAGNOSIS — R51 Headache: Secondary | ICD-10-CM | POA: Diagnosis not present

## 2017-03-03 DIAGNOSIS — D688 Other specified coagulation defects: Secondary | ICD-10-CM | POA: Diagnosis not present

## 2017-03-03 DIAGNOSIS — D631 Anemia in chronic kidney disease: Secondary | ICD-10-CM | POA: Diagnosis not present

## 2017-03-03 DIAGNOSIS — N2581 Secondary hyperparathyroidism of renal origin: Secondary | ICD-10-CM | POA: Diagnosis not present

## 2017-03-04 DIAGNOSIS — T82858A Stenosis of vascular prosthetic devices, implants and grafts, initial encounter: Secondary | ICD-10-CM | POA: Diagnosis not present

## 2017-03-04 DIAGNOSIS — I871 Compression of vein: Secondary | ICD-10-CM | POA: Diagnosis not present

## 2017-03-04 DIAGNOSIS — N186 End stage renal disease: Secondary | ICD-10-CM | POA: Diagnosis not present

## 2017-03-04 DIAGNOSIS — Z992 Dependence on renal dialysis: Secondary | ICD-10-CM | POA: Diagnosis not present

## 2017-03-05 DIAGNOSIS — E1129 Type 2 diabetes mellitus with other diabetic kidney complication: Secondary | ICD-10-CM | POA: Diagnosis not present

## 2017-03-05 DIAGNOSIS — D631 Anemia in chronic kidney disease: Secondary | ICD-10-CM | POA: Diagnosis not present

## 2017-03-05 DIAGNOSIS — T8249XA Other complication of vascular dialysis catheter, initial encounter: Secondary | ICD-10-CM | POA: Diagnosis not present

## 2017-03-05 DIAGNOSIS — D688 Other specified coagulation defects: Secondary | ICD-10-CM | POA: Diagnosis not present

## 2017-03-05 DIAGNOSIS — D509 Iron deficiency anemia, unspecified: Secondary | ICD-10-CM | POA: Diagnosis not present

## 2017-03-05 DIAGNOSIS — Z23 Encounter for immunization: Secondary | ICD-10-CM | POA: Diagnosis not present

## 2017-03-05 DIAGNOSIS — N2581 Secondary hyperparathyroidism of renal origin: Secondary | ICD-10-CM | POA: Diagnosis not present

## 2017-03-05 DIAGNOSIS — R51 Headache: Secondary | ICD-10-CM | POA: Diagnosis not present

## 2017-03-05 DIAGNOSIS — N186 End stage renal disease: Secondary | ICD-10-CM | POA: Diagnosis not present

## 2017-03-07 DIAGNOSIS — Z23 Encounter for immunization: Secondary | ICD-10-CM | POA: Diagnosis not present

## 2017-03-07 DIAGNOSIS — N186 End stage renal disease: Secondary | ICD-10-CM | POA: Diagnosis not present

## 2017-03-07 DIAGNOSIS — D688 Other specified coagulation defects: Secondary | ICD-10-CM | POA: Diagnosis not present

## 2017-03-07 DIAGNOSIS — R51 Headache: Secondary | ICD-10-CM | POA: Diagnosis not present

## 2017-03-07 DIAGNOSIS — D509 Iron deficiency anemia, unspecified: Secondary | ICD-10-CM | POA: Diagnosis not present

## 2017-03-07 DIAGNOSIS — D631 Anemia in chronic kidney disease: Secondary | ICD-10-CM | POA: Diagnosis not present

## 2017-03-07 DIAGNOSIS — T8249XA Other complication of vascular dialysis catheter, initial encounter: Secondary | ICD-10-CM | POA: Diagnosis not present

## 2017-03-07 DIAGNOSIS — N2581 Secondary hyperparathyroidism of renal origin: Secondary | ICD-10-CM | POA: Diagnosis not present

## 2017-03-07 DIAGNOSIS — E1129 Type 2 diabetes mellitus with other diabetic kidney complication: Secondary | ICD-10-CM | POA: Diagnosis not present

## 2017-03-09 DIAGNOSIS — N2581 Secondary hyperparathyroidism of renal origin: Secondary | ICD-10-CM | POA: Diagnosis not present

## 2017-03-09 DIAGNOSIS — D631 Anemia in chronic kidney disease: Secondary | ICD-10-CM | POA: Diagnosis not present

## 2017-03-09 DIAGNOSIS — N186 End stage renal disease: Secondary | ICD-10-CM | POA: Diagnosis not present

## 2017-03-09 DIAGNOSIS — Z23 Encounter for immunization: Secondary | ICD-10-CM | POA: Diagnosis not present

## 2017-03-09 DIAGNOSIS — R51 Headache: Secondary | ICD-10-CM | POA: Diagnosis not present

## 2017-03-09 DIAGNOSIS — D509 Iron deficiency anemia, unspecified: Secondary | ICD-10-CM | POA: Diagnosis not present

## 2017-03-09 DIAGNOSIS — E1129 Type 2 diabetes mellitus with other diabetic kidney complication: Secondary | ICD-10-CM | POA: Diagnosis not present

## 2017-03-09 DIAGNOSIS — T8249XA Other complication of vascular dialysis catheter, initial encounter: Secondary | ICD-10-CM | POA: Diagnosis not present

## 2017-03-09 DIAGNOSIS — D688 Other specified coagulation defects: Secondary | ICD-10-CM | POA: Diagnosis not present

## 2017-03-12 DIAGNOSIS — D688 Other specified coagulation defects: Secondary | ICD-10-CM | POA: Diagnosis not present

## 2017-03-12 DIAGNOSIS — D509 Iron deficiency anemia, unspecified: Secondary | ICD-10-CM | POA: Diagnosis not present

## 2017-03-12 DIAGNOSIS — R51 Headache: Secondary | ICD-10-CM | POA: Diagnosis not present

## 2017-03-12 DIAGNOSIS — T8249XA Other complication of vascular dialysis catheter, initial encounter: Secondary | ICD-10-CM | POA: Diagnosis not present

## 2017-03-12 DIAGNOSIS — Z23 Encounter for immunization: Secondary | ICD-10-CM | POA: Diagnosis not present

## 2017-03-12 DIAGNOSIS — N186 End stage renal disease: Secondary | ICD-10-CM | POA: Diagnosis not present

## 2017-03-12 DIAGNOSIS — D631 Anemia in chronic kidney disease: Secondary | ICD-10-CM | POA: Diagnosis not present

## 2017-03-12 DIAGNOSIS — N2581 Secondary hyperparathyroidism of renal origin: Secondary | ICD-10-CM | POA: Diagnosis not present

## 2017-03-12 DIAGNOSIS — E1129 Type 2 diabetes mellitus with other diabetic kidney complication: Secondary | ICD-10-CM | POA: Diagnosis not present

## 2017-03-15 DIAGNOSIS — R51 Headache: Secondary | ICD-10-CM | POA: Diagnosis not present

## 2017-03-15 DIAGNOSIS — T8249XA Other complication of vascular dialysis catheter, initial encounter: Secondary | ICD-10-CM | POA: Diagnosis not present

## 2017-03-15 DIAGNOSIS — D688 Other specified coagulation defects: Secondary | ICD-10-CM | POA: Diagnosis not present

## 2017-03-15 DIAGNOSIS — Z23 Encounter for immunization: Secondary | ICD-10-CM | POA: Diagnosis not present

## 2017-03-15 DIAGNOSIS — D631 Anemia in chronic kidney disease: Secondary | ICD-10-CM | POA: Diagnosis not present

## 2017-03-15 DIAGNOSIS — N186 End stage renal disease: Secondary | ICD-10-CM | POA: Diagnosis not present

## 2017-03-15 DIAGNOSIS — D509 Iron deficiency anemia, unspecified: Secondary | ICD-10-CM | POA: Diagnosis not present

## 2017-03-15 DIAGNOSIS — N2581 Secondary hyperparathyroidism of renal origin: Secondary | ICD-10-CM | POA: Diagnosis not present

## 2017-03-15 DIAGNOSIS — E1129 Type 2 diabetes mellitus with other diabetic kidney complication: Secondary | ICD-10-CM | POA: Diagnosis not present

## 2017-03-16 ENCOUNTER — Other Ambulatory Visit: Payer: Self-pay

## 2017-03-16 DIAGNOSIS — T82858A Stenosis of vascular prosthetic devices, implants and grafts, initial encounter: Secondary | ICD-10-CM

## 2017-03-16 DIAGNOSIS — N186 End stage renal disease: Secondary | ICD-10-CM

## 2017-03-17 DIAGNOSIS — N2581 Secondary hyperparathyroidism of renal origin: Secondary | ICD-10-CM | POA: Diagnosis not present

## 2017-03-17 DIAGNOSIS — D631 Anemia in chronic kidney disease: Secondary | ICD-10-CM | POA: Diagnosis not present

## 2017-03-17 DIAGNOSIS — R51 Headache: Secondary | ICD-10-CM | POA: Diagnosis not present

## 2017-03-17 DIAGNOSIS — E1129 Type 2 diabetes mellitus with other diabetic kidney complication: Secondary | ICD-10-CM | POA: Diagnosis not present

## 2017-03-17 DIAGNOSIS — T8249XA Other complication of vascular dialysis catheter, initial encounter: Secondary | ICD-10-CM | POA: Diagnosis not present

## 2017-03-17 DIAGNOSIS — D688 Other specified coagulation defects: Secondary | ICD-10-CM | POA: Diagnosis not present

## 2017-03-17 DIAGNOSIS — D509 Iron deficiency anemia, unspecified: Secondary | ICD-10-CM | POA: Diagnosis not present

## 2017-03-17 DIAGNOSIS — Z23 Encounter for immunization: Secondary | ICD-10-CM | POA: Diagnosis not present

## 2017-03-17 DIAGNOSIS — N186 End stage renal disease: Secondary | ICD-10-CM | POA: Diagnosis not present

## 2017-03-18 DIAGNOSIS — E1129 Type 2 diabetes mellitus with other diabetic kidney complication: Secondary | ICD-10-CM | POA: Diagnosis not present

## 2017-03-18 DIAGNOSIS — N186 End stage renal disease: Secondary | ICD-10-CM | POA: Diagnosis not present

## 2017-03-18 DIAGNOSIS — Z992 Dependence on renal dialysis: Secondary | ICD-10-CM | POA: Diagnosis not present

## 2017-03-19 DIAGNOSIS — D688 Other specified coagulation defects: Secondary | ICD-10-CM | POA: Diagnosis not present

## 2017-03-19 DIAGNOSIS — T8249XA Other complication of vascular dialysis catheter, initial encounter: Secondary | ICD-10-CM | POA: Diagnosis not present

## 2017-03-19 DIAGNOSIS — R51 Headache: Secondary | ICD-10-CM | POA: Diagnosis not present

## 2017-03-19 DIAGNOSIS — Z23 Encounter for immunization: Secondary | ICD-10-CM | POA: Diagnosis not present

## 2017-03-19 DIAGNOSIS — N186 End stage renal disease: Secondary | ICD-10-CM | POA: Diagnosis not present

## 2017-03-19 DIAGNOSIS — D509 Iron deficiency anemia, unspecified: Secondary | ICD-10-CM | POA: Diagnosis not present

## 2017-03-19 DIAGNOSIS — N2581 Secondary hyperparathyroidism of renal origin: Secondary | ICD-10-CM | POA: Diagnosis not present

## 2017-03-19 DIAGNOSIS — E1129 Type 2 diabetes mellitus with other diabetic kidney complication: Secondary | ICD-10-CM | POA: Diagnosis not present

## 2017-03-22 DIAGNOSIS — Z23 Encounter for immunization: Secondary | ICD-10-CM | POA: Diagnosis not present

## 2017-03-22 DIAGNOSIS — N2581 Secondary hyperparathyroidism of renal origin: Secondary | ICD-10-CM | POA: Diagnosis not present

## 2017-03-22 DIAGNOSIS — E1129 Type 2 diabetes mellitus with other diabetic kidney complication: Secondary | ICD-10-CM | POA: Diagnosis not present

## 2017-03-22 DIAGNOSIS — T8249XA Other complication of vascular dialysis catheter, initial encounter: Secondary | ICD-10-CM | POA: Diagnosis not present

## 2017-03-22 DIAGNOSIS — D688 Other specified coagulation defects: Secondary | ICD-10-CM | POA: Diagnosis not present

## 2017-03-22 DIAGNOSIS — D509 Iron deficiency anemia, unspecified: Secondary | ICD-10-CM | POA: Diagnosis not present

## 2017-03-22 DIAGNOSIS — R51 Headache: Secondary | ICD-10-CM | POA: Diagnosis not present

## 2017-03-22 DIAGNOSIS — N186 End stage renal disease: Secondary | ICD-10-CM | POA: Diagnosis not present

## 2017-03-23 ENCOUNTER — Other Ambulatory Visit: Payer: Self-pay | Admitting: Family Medicine

## 2017-03-23 DIAGNOSIS — E118 Type 2 diabetes mellitus with unspecified complications: Secondary | ICD-10-CM

## 2017-03-24 DIAGNOSIS — D688 Other specified coagulation defects: Secondary | ICD-10-CM | POA: Diagnosis not present

## 2017-03-24 DIAGNOSIS — R51 Headache: Secondary | ICD-10-CM | POA: Diagnosis not present

## 2017-03-24 DIAGNOSIS — N2581 Secondary hyperparathyroidism of renal origin: Secondary | ICD-10-CM | POA: Diagnosis not present

## 2017-03-24 DIAGNOSIS — Z23 Encounter for immunization: Secondary | ICD-10-CM | POA: Diagnosis not present

## 2017-03-24 DIAGNOSIS — E1129 Type 2 diabetes mellitus with other diabetic kidney complication: Secondary | ICD-10-CM | POA: Diagnosis not present

## 2017-03-24 DIAGNOSIS — D509 Iron deficiency anemia, unspecified: Secondary | ICD-10-CM | POA: Diagnosis not present

## 2017-03-24 DIAGNOSIS — T8249XA Other complication of vascular dialysis catheter, initial encounter: Secondary | ICD-10-CM | POA: Diagnosis not present

## 2017-03-24 DIAGNOSIS — N186 End stage renal disease: Secondary | ICD-10-CM | POA: Diagnosis not present

## 2017-03-26 DIAGNOSIS — N2581 Secondary hyperparathyroidism of renal origin: Secondary | ICD-10-CM | POA: Diagnosis not present

## 2017-03-26 DIAGNOSIS — Z23 Encounter for immunization: Secondary | ICD-10-CM | POA: Diagnosis not present

## 2017-03-26 DIAGNOSIS — N186 End stage renal disease: Secondary | ICD-10-CM | POA: Diagnosis not present

## 2017-03-26 DIAGNOSIS — T8249XA Other complication of vascular dialysis catheter, initial encounter: Secondary | ICD-10-CM | POA: Diagnosis not present

## 2017-03-26 DIAGNOSIS — D509 Iron deficiency anemia, unspecified: Secondary | ICD-10-CM | POA: Diagnosis not present

## 2017-03-26 DIAGNOSIS — D688 Other specified coagulation defects: Secondary | ICD-10-CM | POA: Diagnosis not present

## 2017-03-26 DIAGNOSIS — R51 Headache: Secondary | ICD-10-CM | POA: Diagnosis not present

## 2017-03-26 DIAGNOSIS — E1129 Type 2 diabetes mellitus with other diabetic kidney complication: Secondary | ICD-10-CM | POA: Diagnosis not present

## 2017-03-28 ENCOUNTER — Encounter (HOSPITAL_COMMUNITY): Payer: Medicare Other

## 2017-03-28 ENCOUNTER — Other Ambulatory Visit (HOSPITAL_COMMUNITY): Payer: Medicare Other

## 2017-03-28 ENCOUNTER — Ambulatory Visit: Payer: Medicare Other | Admitting: Surgery

## 2017-03-29 DIAGNOSIS — T8249XA Other complication of vascular dialysis catheter, initial encounter: Secondary | ICD-10-CM | POA: Diagnosis not present

## 2017-03-29 DIAGNOSIS — N2581 Secondary hyperparathyroidism of renal origin: Secondary | ICD-10-CM | POA: Diagnosis not present

## 2017-03-29 DIAGNOSIS — D509 Iron deficiency anemia, unspecified: Secondary | ICD-10-CM | POA: Diagnosis not present

## 2017-03-29 DIAGNOSIS — Z23 Encounter for immunization: Secondary | ICD-10-CM | POA: Diagnosis not present

## 2017-03-29 DIAGNOSIS — R51 Headache: Secondary | ICD-10-CM | POA: Diagnosis not present

## 2017-03-29 DIAGNOSIS — N186 End stage renal disease: Secondary | ICD-10-CM | POA: Diagnosis not present

## 2017-03-29 DIAGNOSIS — E1129 Type 2 diabetes mellitus with other diabetic kidney complication: Secondary | ICD-10-CM | POA: Diagnosis not present

## 2017-03-29 DIAGNOSIS — D688 Other specified coagulation defects: Secondary | ICD-10-CM | POA: Diagnosis not present

## 2017-03-31 DIAGNOSIS — N2581 Secondary hyperparathyroidism of renal origin: Secondary | ICD-10-CM | POA: Diagnosis not present

## 2017-03-31 DIAGNOSIS — T8249XA Other complication of vascular dialysis catheter, initial encounter: Secondary | ICD-10-CM | POA: Diagnosis not present

## 2017-03-31 DIAGNOSIS — Z23 Encounter for immunization: Secondary | ICD-10-CM | POA: Diagnosis not present

## 2017-03-31 DIAGNOSIS — E1129 Type 2 diabetes mellitus with other diabetic kidney complication: Secondary | ICD-10-CM | POA: Diagnosis not present

## 2017-03-31 DIAGNOSIS — D509 Iron deficiency anemia, unspecified: Secondary | ICD-10-CM | POA: Diagnosis not present

## 2017-03-31 DIAGNOSIS — R51 Headache: Secondary | ICD-10-CM | POA: Diagnosis not present

## 2017-03-31 DIAGNOSIS — D688 Other specified coagulation defects: Secondary | ICD-10-CM | POA: Diagnosis not present

## 2017-03-31 DIAGNOSIS — N186 End stage renal disease: Secondary | ICD-10-CM | POA: Diagnosis not present

## 2017-04-02 DIAGNOSIS — N2581 Secondary hyperparathyroidism of renal origin: Secondary | ICD-10-CM | POA: Diagnosis not present

## 2017-04-02 DIAGNOSIS — T8249XA Other complication of vascular dialysis catheter, initial encounter: Secondary | ICD-10-CM | POA: Diagnosis not present

## 2017-04-02 DIAGNOSIS — E1129 Type 2 diabetes mellitus with other diabetic kidney complication: Secondary | ICD-10-CM | POA: Diagnosis not present

## 2017-04-02 DIAGNOSIS — D509 Iron deficiency anemia, unspecified: Secondary | ICD-10-CM | POA: Diagnosis not present

## 2017-04-02 DIAGNOSIS — N186 End stage renal disease: Secondary | ICD-10-CM | POA: Diagnosis not present

## 2017-04-02 DIAGNOSIS — D688 Other specified coagulation defects: Secondary | ICD-10-CM | POA: Diagnosis not present

## 2017-04-02 DIAGNOSIS — Z23 Encounter for immunization: Secondary | ICD-10-CM | POA: Diagnosis not present

## 2017-04-02 DIAGNOSIS — R51 Headache: Secondary | ICD-10-CM | POA: Diagnosis not present

## 2017-04-05 DIAGNOSIS — N186 End stage renal disease: Secondary | ICD-10-CM | POA: Diagnosis not present

## 2017-04-05 DIAGNOSIS — E1129 Type 2 diabetes mellitus with other diabetic kidney complication: Secondary | ICD-10-CM | POA: Diagnosis not present

## 2017-04-05 DIAGNOSIS — R51 Headache: Secondary | ICD-10-CM | POA: Diagnosis not present

## 2017-04-05 DIAGNOSIS — Z23 Encounter for immunization: Secondary | ICD-10-CM | POA: Diagnosis not present

## 2017-04-05 DIAGNOSIS — D688 Other specified coagulation defects: Secondary | ICD-10-CM | POA: Diagnosis not present

## 2017-04-05 DIAGNOSIS — D509 Iron deficiency anemia, unspecified: Secondary | ICD-10-CM | POA: Diagnosis not present

## 2017-04-05 DIAGNOSIS — T8249XA Other complication of vascular dialysis catheter, initial encounter: Secondary | ICD-10-CM | POA: Diagnosis not present

## 2017-04-05 DIAGNOSIS — N2581 Secondary hyperparathyroidism of renal origin: Secondary | ICD-10-CM | POA: Diagnosis not present

## 2017-04-07 DIAGNOSIS — N2581 Secondary hyperparathyroidism of renal origin: Secondary | ICD-10-CM | POA: Diagnosis not present

## 2017-04-07 DIAGNOSIS — Z23 Encounter for immunization: Secondary | ICD-10-CM | POA: Diagnosis not present

## 2017-04-07 DIAGNOSIS — D509 Iron deficiency anemia, unspecified: Secondary | ICD-10-CM | POA: Diagnosis not present

## 2017-04-07 DIAGNOSIS — N186 End stage renal disease: Secondary | ICD-10-CM | POA: Diagnosis not present

## 2017-04-07 DIAGNOSIS — E1129 Type 2 diabetes mellitus with other diabetic kidney complication: Secondary | ICD-10-CM | POA: Diagnosis not present

## 2017-04-07 DIAGNOSIS — T8249XA Other complication of vascular dialysis catheter, initial encounter: Secondary | ICD-10-CM | POA: Diagnosis not present

## 2017-04-07 DIAGNOSIS — D688 Other specified coagulation defects: Secondary | ICD-10-CM | POA: Diagnosis not present

## 2017-04-07 DIAGNOSIS — R51 Headache: Secondary | ICD-10-CM | POA: Diagnosis not present

## 2017-04-09 DIAGNOSIS — T8249XA Other complication of vascular dialysis catheter, initial encounter: Secondary | ICD-10-CM | POA: Diagnosis not present

## 2017-04-09 DIAGNOSIS — R51 Headache: Secondary | ICD-10-CM | POA: Diagnosis not present

## 2017-04-09 DIAGNOSIS — N2581 Secondary hyperparathyroidism of renal origin: Secondary | ICD-10-CM | POA: Diagnosis not present

## 2017-04-09 DIAGNOSIS — Z23 Encounter for immunization: Secondary | ICD-10-CM | POA: Diagnosis not present

## 2017-04-09 DIAGNOSIS — N186 End stage renal disease: Secondary | ICD-10-CM | POA: Diagnosis not present

## 2017-04-09 DIAGNOSIS — D688 Other specified coagulation defects: Secondary | ICD-10-CM | POA: Diagnosis not present

## 2017-04-09 DIAGNOSIS — D509 Iron deficiency anemia, unspecified: Secondary | ICD-10-CM | POA: Diagnosis not present

## 2017-04-09 DIAGNOSIS — E1129 Type 2 diabetes mellitus with other diabetic kidney complication: Secondary | ICD-10-CM | POA: Diagnosis not present

## 2017-04-11 DIAGNOSIS — N2581 Secondary hyperparathyroidism of renal origin: Secondary | ICD-10-CM | POA: Diagnosis not present

## 2017-04-11 DIAGNOSIS — D688 Other specified coagulation defects: Secondary | ICD-10-CM | POA: Diagnosis not present

## 2017-04-11 DIAGNOSIS — R51 Headache: Secondary | ICD-10-CM | POA: Diagnosis not present

## 2017-04-11 DIAGNOSIS — Z23 Encounter for immunization: Secondary | ICD-10-CM | POA: Diagnosis not present

## 2017-04-11 DIAGNOSIS — D509 Iron deficiency anemia, unspecified: Secondary | ICD-10-CM | POA: Diagnosis not present

## 2017-04-11 DIAGNOSIS — E1129 Type 2 diabetes mellitus with other diabetic kidney complication: Secondary | ICD-10-CM | POA: Diagnosis not present

## 2017-04-11 DIAGNOSIS — N186 End stage renal disease: Secondary | ICD-10-CM | POA: Diagnosis not present

## 2017-04-11 DIAGNOSIS — T8249XA Other complication of vascular dialysis catheter, initial encounter: Secondary | ICD-10-CM | POA: Diagnosis not present

## 2017-04-14 DIAGNOSIS — Z23 Encounter for immunization: Secondary | ICD-10-CM | POA: Diagnosis not present

## 2017-04-14 DIAGNOSIS — E1129 Type 2 diabetes mellitus with other diabetic kidney complication: Secondary | ICD-10-CM | POA: Diagnosis not present

## 2017-04-14 DIAGNOSIS — R51 Headache: Secondary | ICD-10-CM | POA: Diagnosis not present

## 2017-04-14 DIAGNOSIS — N2581 Secondary hyperparathyroidism of renal origin: Secondary | ICD-10-CM | POA: Diagnosis not present

## 2017-04-14 DIAGNOSIS — T8249XA Other complication of vascular dialysis catheter, initial encounter: Secondary | ICD-10-CM | POA: Diagnosis not present

## 2017-04-14 DIAGNOSIS — D509 Iron deficiency anemia, unspecified: Secondary | ICD-10-CM | POA: Diagnosis not present

## 2017-04-14 DIAGNOSIS — N186 End stage renal disease: Secondary | ICD-10-CM | POA: Diagnosis not present

## 2017-04-14 DIAGNOSIS — D688 Other specified coagulation defects: Secondary | ICD-10-CM | POA: Diagnosis not present

## 2017-04-15 ENCOUNTER — Ambulatory Visit (INDEPENDENT_AMBULATORY_CARE_PROVIDER_SITE_OTHER): Payer: Medicare Other | Admitting: Podiatry

## 2017-04-15 ENCOUNTER — Encounter: Payer: Self-pay | Admitting: Podiatry

## 2017-04-15 DIAGNOSIS — B351 Tinea unguium: Secondary | ICD-10-CM

## 2017-04-15 DIAGNOSIS — M79609 Pain in unspecified limb: Secondary | ICD-10-CM

## 2017-04-15 DIAGNOSIS — E1151 Type 2 diabetes mellitus with diabetic peripheral angiopathy without gangrene: Secondary | ICD-10-CM

## 2017-04-15 DIAGNOSIS — Z794 Long term (current) use of insulin: Secondary | ICD-10-CM

## 2017-04-15 NOTE — Progress Notes (Signed)
Patient ID: [name], female   DOB: Sep 04, 1940, 76 y.o.   MRN: [id number] HPI  Complaint:  Visit Type: Patient returns to my office for continued preventative foot care services. Complaint: Patient states" my nails have grown long and thick and become painful to walk and wear shoes" Patient has been diagnosed with DM . This patient  presents for preventative foot care services. No changes to ROS  Podiatric Exam: Vascular: dorsalis pedis and posterior tibial pulses are non palpable due to swelling both feet.. Capillary return is slow to refill. Temperature gradient is negative. Skin turgor WNL, bilateral swelling  Sensorium: Diminished  Semmes Weinstein monofilament test. Normal tactile sensation bilaterally.  Nail Exam: Pt has thick disfigured discolored nails with subungual debris noted bilateral entire nail hallux through fifth toenails Ulcer Exam: There is no evidence of ulcer or pre-ulcerative changes or infection. Orthopedic Exam: Muscle tone and strength are WNL. No limitations in general ROM. No crepitus or effusions noted. Foot type and digits show no abnormalities.  HAV  B/L Skin: No Porokeratosis. No infection or ulcers.  Blackened circular lesions 1st MPJ left and heel medial aspect left.  Diagnosis:  Onychomycosis, Pain in right toe, pain in left toes  Treatment & Plan Procedures and Treatment: Consent by patient was obtained for treatment procedures. The patient understood the discussion of treatment and procedures well. All questions were answered thoroughly reviewed. Debridement of mycotic and hypertrophic toenails, 1 through 5 bilateral and clearing of subungual debris. No ulceration, no infection noted.  Return Visit-Office Procedure: Patient instructed to return to the office for a follow up visit 3 months for continued evaluation and treatment.   Gardiner Barefoot DPM

## 2017-04-16 DIAGNOSIS — E1129 Type 2 diabetes mellitus with other diabetic kidney complication: Secondary | ICD-10-CM | POA: Diagnosis not present

## 2017-04-16 DIAGNOSIS — N186 End stage renal disease: Secondary | ICD-10-CM | POA: Diagnosis not present

## 2017-04-16 DIAGNOSIS — D688 Other specified coagulation defects: Secondary | ICD-10-CM | POA: Diagnosis not present

## 2017-04-16 DIAGNOSIS — D509 Iron deficiency anemia, unspecified: Secondary | ICD-10-CM | POA: Diagnosis not present

## 2017-04-16 DIAGNOSIS — T8249XA Other complication of vascular dialysis catheter, initial encounter: Secondary | ICD-10-CM | POA: Diagnosis not present

## 2017-04-16 DIAGNOSIS — Z23 Encounter for immunization: Secondary | ICD-10-CM | POA: Diagnosis not present

## 2017-04-16 DIAGNOSIS — N2581 Secondary hyperparathyroidism of renal origin: Secondary | ICD-10-CM | POA: Diagnosis not present

## 2017-04-16 DIAGNOSIS — R51 Headache: Secondary | ICD-10-CM | POA: Diagnosis not present

## 2017-04-18 DIAGNOSIS — E1129 Type 2 diabetes mellitus with other diabetic kidney complication: Secondary | ICD-10-CM | POA: Diagnosis not present

## 2017-04-18 DIAGNOSIS — R51 Headache: Secondary | ICD-10-CM | POA: Diagnosis not present

## 2017-04-18 DIAGNOSIS — D509 Iron deficiency anemia, unspecified: Secondary | ICD-10-CM | POA: Diagnosis not present

## 2017-04-18 DIAGNOSIS — N186 End stage renal disease: Secondary | ICD-10-CM | POA: Diagnosis not present

## 2017-04-18 DIAGNOSIS — T8249XA Other complication of vascular dialysis catheter, initial encounter: Secondary | ICD-10-CM | POA: Diagnosis not present

## 2017-04-18 DIAGNOSIS — Z23 Encounter for immunization: Secondary | ICD-10-CM | POA: Diagnosis not present

## 2017-04-18 DIAGNOSIS — Z992 Dependence on renal dialysis: Secondary | ICD-10-CM | POA: Diagnosis not present

## 2017-04-18 DIAGNOSIS — D688 Other specified coagulation defects: Secondary | ICD-10-CM | POA: Diagnosis not present

## 2017-04-18 DIAGNOSIS — N2581 Secondary hyperparathyroidism of renal origin: Secondary | ICD-10-CM | POA: Diagnosis not present

## 2017-04-21 DIAGNOSIS — D688 Other specified coagulation defects: Secondary | ICD-10-CM | POA: Diagnosis not present

## 2017-04-21 DIAGNOSIS — R739 Hyperglycemia, unspecified: Secondary | ICD-10-CM | POA: Diagnosis not present

## 2017-04-21 DIAGNOSIS — N2581 Secondary hyperparathyroidism of renal origin: Secondary | ICD-10-CM | POA: Diagnosis not present

## 2017-04-21 DIAGNOSIS — Z23 Encounter for immunization: Secondary | ICD-10-CM | POA: Diagnosis not present

## 2017-04-21 DIAGNOSIS — N186 End stage renal disease: Secondary | ICD-10-CM | POA: Diagnosis not present

## 2017-04-21 DIAGNOSIS — D631 Anemia in chronic kidney disease: Secondary | ICD-10-CM | POA: Diagnosis not present

## 2017-04-21 DIAGNOSIS — T8249XA Other complication of vascular dialysis catheter, initial encounter: Secondary | ICD-10-CM | POA: Diagnosis not present

## 2017-04-21 DIAGNOSIS — D509 Iron deficiency anemia, unspecified: Secondary | ICD-10-CM | POA: Diagnosis not present

## 2017-04-21 DIAGNOSIS — E1129 Type 2 diabetes mellitus with other diabetic kidney complication: Secondary | ICD-10-CM | POA: Diagnosis not present

## 2017-04-21 DIAGNOSIS — R51 Headache: Secondary | ICD-10-CM | POA: Diagnosis not present

## 2017-04-23 DIAGNOSIS — T8249XA Other complication of vascular dialysis catheter, initial encounter: Secondary | ICD-10-CM | POA: Diagnosis not present

## 2017-04-23 DIAGNOSIS — N186 End stage renal disease: Secondary | ICD-10-CM | POA: Diagnosis not present

## 2017-04-23 DIAGNOSIS — E1129 Type 2 diabetes mellitus with other diabetic kidney complication: Secondary | ICD-10-CM | POA: Diagnosis not present

## 2017-04-23 DIAGNOSIS — R739 Hyperglycemia, unspecified: Secondary | ICD-10-CM | POA: Diagnosis not present

## 2017-04-23 DIAGNOSIS — R51 Headache: Secondary | ICD-10-CM | POA: Diagnosis not present

## 2017-04-23 DIAGNOSIS — D631 Anemia in chronic kidney disease: Secondary | ICD-10-CM | POA: Diagnosis not present

## 2017-04-23 DIAGNOSIS — N2581 Secondary hyperparathyroidism of renal origin: Secondary | ICD-10-CM | POA: Diagnosis not present

## 2017-04-23 DIAGNOSIS — D688 Other specified coagulation defects: Secondary | ICD-10-CM | POA: Diagnosis not present

## 2017-04-23 DIAGNOSIS — Z23 Encounter for immunization: Secondary | ICD-10-CM | POA: Diagnosis not present

## 2017-04-23 DIAGNOSIS — D509 Iron deficiency anemia, unspecified: Secondary | ICD-10-CM | POA: Diagnosis not present

## 2017-04-26 DIAGNOSIS — R51 Headache: Secondary | ICD-10-CM | POA: Diagnosis not present

## 2017-04-26 DIAGNOSIS — R739 Hyperglycemia, unspecified: Secondary | ICD-10-CM | POA: Diagnosis not present

## 2017-04-26 DIAGNOSIS — Z23 Encounter for immunization: Secondary | ICD-10-CM | POA: Diagnosis not present

## 2017-04-26 DIAGNOSIS — D509 Iron deficiency anemia, unspecified: Secondary | ICD-10-CM | POA: Diagnosis not present

## 2017-04-26 DIAGNOSIS — D688 Other specified coagulation defects: Secondary | ICD-10-CM | POA: Diagnosis not present

## 2017-04-26 DIAGNOSIS — N2581 Secondary hyperparathyroidism of renal origin: Secondary | ICD-10-CM | POA: Diagnosis not present

## 2017-04-26 DIAGNOSIS — N186 End stage renal disease: Secondary | ICD-10-CM | POA: Diagnosis not present

## 2017-04-26 DIAGNOSIS — E1129 Type 2 diabetes mellitus with other diabetic kidney complication: Secondary | ICD-10-CM | POA: Diagnosis not present

## 2017-04-26 DIAGNOSIS — D631 Anemia in chronic kidney disease: Secondary | ICD-10-CM | POA: Diagnosis not present

## 2017-04-26 DIAGNOSIS — T8249XA Other complication of vascular dialysis catheter, initial encounter: Secondary | ICD-10-CM | POA: Diagnosis not present

## 2017-04-28 DIAGNOSIS — Z23 Encounter for immunization: Secondary | ICD-10-CM | POA: Diagnosis not present

## 2017-04-28 DIAGNOSIS — R51 Headache: Secondary | ICD-10-CM | POA: Diagnosis not present

## 2017-04-28 DIAGNOSIS — D509 Iron deficiency anemia, unspecified: Secondary | ICD-10-CM | POA: Diagnosis not present

## 2017-04-28 DIAGNOSIS — E1129 Type 2 diabetes mellitus with other diabetic kidney complication: Secondary | ICD-10-CM | POA: Diagnosis not present

## 2017-04-28 DIAGNOSIS — R739 Hyperglycemia, unspecified: Secondary | ICD-10-CM | POA: Diagnosis not present

## 2017-04-28 DIAGNOSIS — D631 Anemia in chronic kidney disease: Secondary | ICD-10-CM | POA: Diagnosis not present

## 2017-04-28 DIAGNOSIS — D688 Other specified coagulation defects: Secondary | ICD-10-CM | POA: Diagnosis not present

## 2017-04-28 DIAGNOSIS — T8249XA Other complication of vascular dialysis catheter, initial encounter: Secondary | ICD-10-CM | POA: Diagnosis not present

## 2017-04-28 DIAGNOSIS — N2581 Secondary hyperparathyroidism of renal origin: Secondary | ICD-10-CM | POA: Diagnosis not present

## 2017-04-28 DIAGNOSIS — N186 End stage renal disease: Secondary | ICD-10-CM | POA: Diagnosis not present

## 2017-04-30 DIAGNOSIS — E1129 Type 2 diabetes mellitus with other diabetic kidney complication: Secondary | ICD-10-CM | POA: Diagnosis not present

## 2017-04-30 DIAGNOSIS — D509 Iron deficiency anemia, unspecified: Secondary | ICD-10-CM | POA: Diagnosis not present

## 2017-04-30 DIAGNOSIS — Z23 Encounter for immunization: Secondary | ICD-10-CM | POA: Diagnosis not present

## 2017-04-30 DIAGNOSIS — D631 Anemia in chronic kidney disease: Secondary | ICD-10-CM | POA: Diagnosis not present

## 2017-04-30 DIAGNOSIS — R739 Hyperglycemia, unspecified: Secondary | ICD-10-CM | POA: Diagnosis not present

## 2017-04-30 DIAGNOSIS — R51 Headache: Secondary | ICD-10-CM | POA: Diagnosis not present

## 2017-04-30 DIAGNOSIS — N186 End stage renal disease: Secondary | ICD-10-CM | POA: Diagnosis not present

## 2017-04-30 DIAGNOSIS — T8249XA Other complication of vascular dialysis catheter, initial encounter: Secondary | ICD-10-CM | POA: Diagnosis not present

## 2017-04-30 DIAGNOSIS — D688 Other specified coagulation defects: Secondary | ICD-10-CM | POA: Diagnosis not present

## 2017-04-30 DIAGNOSIS — N2581 Secondary hyperparathyroidism of renal origin: Secondary | ICD-10-CM | POA: Diagnosis not present

## 2017-05-02 ENCOUNTER — Ambulatory Visit (INDEPENDENT_AMBULATORY_CARE_PROVIDER_SITE_OTHER): Payer: Medicare Other | Admitting: Surgery

## 2017-05-02 ENCOUNTER — Ambulatory Visit (HOSPITAL_COMMUNITY)
Admission: RE | Admit: 2017-05-02 | Discharge: 2017-05-02 | Disposition: A | Discharge status: Home / Self Care | Payer: Medicare Other | Source: Ambulatory Visit | Attending: Surgery | Admitting: Surgery

## 2017-05-02 ENCOUNTER — Encounter: Payer: Self-pay | Admitting: Surgery

## 2017-05-02 ENCOUNTER — Ambulatory Visit (INDEPENDENT_AMBULATORY_CARE_PROVIDER_SITE_OTHER)
Admission: RE | Admit: 2017-05-02 | Discharge: 2017-05-02 | Disposition: A | Discharge status: Home / Self Care | Payer: Medicare Other | Source: Ambulatory Visit | Attending: Surgery | Admitting: Surgery

## 2017-05-02 ENCOUNTER — Encounter: Payer: Self-pay | Admitting: *Deleted

## 2017-05-02 VITALS — BP 156/70 | HR 71 | Temp 97.7°F | Resp 20 | Ht 62.0 in | Wt 232.0 lb

## 2017-05-02 DIAGNOSIS — Z01818 Encounter for other preprocedural examination: Secondary | ICD-10-CM | POA: Diagnosis not present

## 2017-05-02 DIAGNOSIS — N186 End stage renal disease: Secondary | ICD-10-CM

## 2017-05-02 DIAGNOSIS — T82858A Stenosis of vascular prosthetic devices, implants and grafts, initial encounter: Secondary | ICD-10-CM | POA: Diagnosis not present

## 2017-05-02 DIAGNOSIS — Y838 Other surgical procedures as the cause of abnormal reaction of the patient, or of later complication, without mention of misadventure at the time of the procedure: Secondary | ICD-10-CM | POA: Insufficient documentation

## 2017-05-02 DIAGNOSIS — Z992 Dependence on renal dialysis: Secondary | ICD-10-CM

## 2017-05-02 NOTE — H&P (View-Only) (Signed)
 [facility] [place]  Patient name: Rachel Hobbs MRN: [id number] DOB: 03/20/1941 Sex: female   REASON FOR VISIT:    Follow up   HISOTRY OF PRESENT ILLNESS:    Rachel Hobbs is a 77 y.o. female who is here today for new dialysis access.  She is status post brachiocephalic fistula creation on 12/05/2015.  This has occluded.  I have previously performed a 2 stage basilic vein fistula which was completed in April 2018.  She ended up having a fistulogram and stenting in August and since that time she has been complaining of numbness in her forearm.  Her fistula has gone on to occlude.     PAST MEDICAL HISTORY:   Past Medical History:  Diagnosis Date  . Arthritis   . CHF (congestive heart failure) (HCC)   . Depression   . Diabetes mellitus    type 2  . Hernia   . Hyperlipidemia   . Hypertension   . Iron deficiency anemia   . Low iron   . Peripheral vascular disease (HCC)    in legs  . Renal disorder    CKD - dialysis T/TH/Sa  . Shortness of breath dyspnea    with exertion     FAMILY HISTORY:   Family History  Problem Relation Age of Onset  . Kidney disease Mother   . Hypertension Mother   . COPD Father        smoke  . Cancer Father        Lung  . Hypertension Father   . Kidney disease Sister   . Diabetes Daughter     SOCIAL HISTORY:   Social History   Tobacco Use  . Smoking status: Never Smoker  . Smokeless tobacco: Never Used  Substance Use Topics  . Alcohol use: No     ALLERGIES:   Allergies  Allergen Reactions  . No Known Allergies      CURRENT MEDICATIONS:   Current Outpatient Medications  Medication Sig Dispense Refill  . acetaminophen (TYLENOL) 500 MG tablet Take 1,000 mg by mouth every 6 (six) hours as needed (pain).     . amLODipine (NORVASC) 10 MG tablet Take 0.5 tablets (5 mg total) by mouth every evening.    . aspirin 81 MG tablet Take 81 mg by mouth daily.      .  atorvastatin (LIPITOR) 40 MG tablet TAKE 1 TABLET BY MOUTH AT NIGHT FOR CHOLESTEROL 90 tablet 0  . Blood Glucose Monitoring Suppl (BLOOD GLUCOSE METER) kit Use as instructed 1 each 0  . calcium acetate (PHOSLO) 667 MG capsule Take 667 mg by mouth 2 (two) times daily with a meal.     . Insulin Pen Needle 31G X 8 MM MISC BD UltraFine III Pen Needles. For use with insulin pen device. Inject insulin 6 x daily 100 each 3  . isosorbide mononitrate (IMDUR) 30 MG 24 hr tablet Take 30 mg by mouth every evening.     . Lancets (ONETOUCH ULTRASOFT) lancets Once daily testing plus prn for hypoglycemia 100 each 9  . LANTUS SOLOSTAR 100 UNIT/ML Solostar Pen INJECT 30 UNITS UNDER THE SKIN DAILY AT 10 PM 15 mL 0  . Multiple Vitamin (MULTIVITAMIN WITH MINERALS) TABS tablet Take 1 tablet by mouth daily.    . NEEDLE, DISP, 30 G (B-D DISP NEEDLE 30GX1") 30G X 1" MISC 1 each by Does not apply route daily. 100 each 2  . ONE TOUCH ULTRA TEST test strip TEST   BLOOD SUGAR EVERY DAY AND AS NEEDED FOR SYMPTOMS OF HYPOGLYCEMIA 100 each 0  . senna (SENOKOT) 8.6 MG tablet Take 1 tablet by mouth daily as needed for constipation.    . triamcinolone cream (KENALOG) 0.1 % Apply 1 application topically 2 (two) times daily as needed. (Patient taking differently: Apply 1 application topically 2 (two) times daily as needed (itching). ) 30 g 2  . triamcinolone ointment (KENALOG) 0.1 % Apply 1 application topically 2 (two) times daily. 453.6 g 0   No current facility-administered medications for this visit.     REVIEW OF SYSTEMS:   [X] denotes positive finding, [ ] denotes negative finding Cardiac  Comments:  Chest pain or chest pressure:    Shortness of breath upon exertion:    Short of breath when lying flat:    Irregular heart rhythm:        Vascular    Pain in calf, thigh, or hip brought on by ambulation:    Pain in feet at night that wakes you up from your sleep:     Blood clot in your veins:    Leg swelling:           Pulmonary    Oxygen at home:    Productive cough:     Wheezing:         Neurologic    Sudden weakness in arms or legs:     Sudden numbness in arms or legs:     Sudden onset of difficulty speaking or slurred speech:    Temporary loss of vision in one eye:     Problems with dizziness:         Gastrointestinal    Blood in stool:     Vomited blood:         Genitourinary    Burning when urinating:     Blood in urine:        Psychiatric    Major depression:         Hematologic    Bleeding problems:    Problems with blood clotting too easily:        Skin    Rashes or ulcers:        Constitutional    Fever or chills:      PHYSICAL EXAM:   Vitals:   05/02/17 1616  BP: (!) 156/70  Pulse: 71  Resp: 20  Temp: 97.7 F (36.5 C)  TempSrc: Oral  SpO2: 100%  Weight: 232 lb (105.2 kg)  Height: 5' 2" (1.575 m)    GENERAL: The patient is a well-nourished female, in no acute distress. The vital signs are documented above. CARDIAC: There is a regular rate and rhythm.  VASCULAR: Palpable left brachial pulse.  Brachiocephalic and basilic vein fistulas are occluded.  Faintly palpable left radial pulse. PULMONARY: Non-labored respirations MUSCULOSKELETAL: There are no major deformities or cyanosis. NEUROLOGIC: No focal weakness or paresthesias are detected. SKIN: There are no ulcers or rashes noted. PSYCHIATRIC: The patient has a normal affect.  STUDIES:   I have ordered and reviewed her vascular lab studies.  She does not have an adequate vein in the left arm.  Arterial studies were normal.  MEDICAL ISSUES:   We discussed proceeding with a left upper arm Gore-Tex graft.  This is been scheduled for Friday, February 1.  The risks and benefits of the operation were discussed the patient and she is willing to proceed.  I am going to evaluate her cephalic vein on the left   while in the operating room as her ultrasound today shows it is open, however it is known to be occluded from  previous fistulogram's.    Wells Lynise Porr, MD [facility] [place] Tel [phone] Pager [phone] 

## 2017-05-02 NOTE — Progress Notes (Signed)
[facility]  Patient name: Rachel Hobbs MRN: [id number] DOB: 09-23-1940 Sex: female   REASON FOR VISIT:    Follow up   HISOTRY OF PRESENT ILLNESS:    Rachel Hobbs is a 77 y.o. female who is here today for new dialysis access.  She is status post brachiocephalic fistula creation on 12/05/2015.  This has occluded.  I have previously performed a 2 stage basilic vein fistula which was completed in April 2018.  She ended up having a fistulogram and stenting in August and since that time she has been complaining of numbness in her forearm.  Her fistula has gone on to occlude.     PAST MEDICAL HISTORY:   Past Medical History:  Diagnosis Date  . Arthritis   . CHF (congestive heart failure) ([place])   . Depression   . Diabetes mellitus    type 2  . Hernia   . Hyperlipidemia   . Hypertension   . Iron deficiency anemia   . Low iron   . Peripheral vascular disease (HCC)    in legs  . Renal disorder    CKD - dialysis T/TH/Sa  . Shortness of breath dyspnea    with exertion     FAMILY HISTORY:   Family History  Problem Relation Age of Onset  . Kidney disease Mother   . Hypertension Mother   . COPD Father        smoke  . Cancer Father        Lung  . Hypertension Father   . Kidney disease Sister   . Diabetes Daughter     SOCIAL HISTORY:   Social History   Tobacco Use  . Smoking status: Never Smoker  . Smokeless tobacco: Never Used  Substance Use Topics  . Alcohol use: No     ALLERGIES:   Allergies  Allergen Reactions  . No Known Allergies      CURRENT MEDICATIONS:   Current Outpatient Medications  Medication Sig Dispense Refill  . acetaminophen (TYLENOL) 500 MG tablet Take 1,000 mg by mouth every 6 (six) hours as needed (pain).     Marland Kitchen amLODipine (NORVASC) 10 MG tablet Take 0.5 tablets (5 mg total) by mouth every evening.    Marland Kitchen aspirin 81 MG tablet Take 81 mg by mouth daily.      Marland Kitchen  atorvastatin (LIPITOR) 40 MG tablet TAKE 1 TABLET BY MOUTH AT NIGHT FOR CHOLESTEROL 90 tablet 0  . Blood Glucose Monitoring Suppl (BLOOD GLUCOSE METER) kit Use as instructed 1 each 0  . calcium acetate (PHOSLO) 667 MG capsule Take 667 mg by mouth 2 (two) times daily with a meal.     . Insulin Pen Needle 31G X 8 MM MISC BD UltraFine III Pen Needles. For use with insulin pen device. Inject insulin 6 x daily 100 each 3  . isosorbide mononitrate (IMDUR) 30 MG 24 hr tablet Take 30 mg by mouth every evening.     . Lancets (ONETOUCH ULTRASOFT) lancets Once daily testing plus prn for hypoglycemia 100 each 9  . LANTUS SOLOSTAR 100 UNIT/ML Solostar Pen INJECT 30 UNITS UNDER THE SKIN DAILY AT 10 PM 15 mL 0  . Multiple Vitamin (MULTIVITAMIN WITH MINERALS) TABS tablet Take 1 tablet by mouth daily.    Marland Kitchen NEEDLE, DISP, 30 G (B-D DISP NEEDLE 30GX1") 30G X 1" MISC 1 each by Does not apply route daily. 100 each 2  . ONE TOUCH ULTRA TEST test strip TEST  BLOOD SUGAR EVERY DAY AND AS NEEDED FOR SYMPTOMS OF HYPOGLYCEMIA 100 each 0  . senna (SENOKOT) 8.6 MG tablet Take 1 tablet by mouth daily as needed for constipation.    . triamcinolone cream (KENALOG) 0.1 % Apply 1 application topically 2 (two) times daily as needed. (Patient taking differently: Apply 1 application topically 2 (two) times daily as needed (itching). ) 30 g 2  . triamcinolone ointment (KENALOG) 0.1 % Apply 1 application topically 2 (two) times daily. 453.6 g 0   No current facility-administered medications for this visit.     REVIEW OF SYSTEMS:   [X] denotes positive finding, [ ] denotes negative finding Cardiac  Comments:  Chest pain or chest pressure:    Shortness of breath upon exertion:    Short of breath when lying flat:    Irregular heart rhythm:        Vascular    Pain in calf, thigh, or hip brought on by ambulation:    Pain in feet at night that wakes you up from your sleep:     Blood clot in your veins:    Leg swelling:           Pulmonary    Oxygen at home:    Productive cough:     Wheezing:         Neurologic    Sudden weakness in arms or legs:     Sudden numbness in arms or legs:     Sudden onset of difficulty speaking or slurred speech:    Temporary loss of vision in one eye:     Problems with dizziness:         Gastrointestinal    Blood in stool:     Vomited blood:         Genitourinary    Burning when urinating:     Blood in urine:        Psychiatric    Major depression:         Hematologic    Bleeding problems:    Problems with blood clotting too easily:        Skin    Rashes or ulcers:        Constitutional    Fever or chills:      PHYSICAL EXAM:   Vitals:   05/02/17 1616  BP: (!) 156/70  Pulse: 71  Resp: 20  Temp: 97.7 F (36.5 C)  TempSrc: Oral  SpO2: 100%  Weight: 232 lb (105.2 kg)  Height: 5' 2" (1.575 m)    GENERAL: The patient is a well-nourished female, in no acute distress. The vital signs are documented above. CARDIAC: There is a regular rate and rhythm.  VASCULAR: Palpable left brachial pulse.  Brachiocephalic and basilic vein fistulas are occluded.  Faintly palpable left radial pulse. PULMONARY: Non-labored respirations MUSCULOSKELETAL: There are no major deformities or cyanosis. NEUROLOGIC: No focal weakness or paresthesias are detected. SKIN: There are no ulcers or rashes noted. PSYCHIATRIC: The patient has a normal affect.  STUDIES:   I have ordered and reviewed her vascular lab studies.  She does not have an adequate vein in the left arm.  Arterial studies were normal.  MEDICAL ISSUES:   We discussed proceeding with a left upper arm Gore-Tex graft.  This is been scheduled for Friday, February 1.  The risks and benefits of the operation were discussed the patient and she is willing to proceed.  I am going to evaluate her cephalic vein on the left  while in the operating room as her ultrasound today shows it is open, however it is known to be occluded from  previous fistulogram's.    Annamarie Major, MD [facility] [phone] Pager [phone]

## 2017-05-03 ENCOUNTER — Other Ambulatory Visit: Payer: Self-pay | Admitting: *Deleted

## 2017-05-03 ENCOUNTER — Telehealth: Payer: Self-pay | Admitting: *Deleted

## 2017-05-03 DIAGNOSIS — T8249XA Other complication of vascular dialysis catheter, initial encounter: Secondary | ICD-10-CM | POA: Diagnosis not present

## 2017-05-03 DIAGNOSIS — D509 Iron deficiency anemia, unspecified: Secondary | ICD-10-CM | POA: Diagnosis not present

## 2017-05-03 DIAGNOSIS — N2581 Secondary hyperparathyroidism of renal origin: Secondary | ICD-10-CM | POA: Diagnosis not present

## 2017-05-03 DIAGNOSIS — D631 Anemia in chronic kidney disease: Secondary | ICD-10-CM | POA: Diagnosis not present

## 2017-05-03 DIAGNOSIS — R739 Hyperglycemia, unspecified: Secondary | ICD-10-CM | POA: Diagnosis not present

## 2017-05-03 DIAGNOSIS — Z23 Encounter for immunization: Secondary | ICD-10-CM | POA: Diagnosis not present

## 2017-05-03 DIAGNOSIS — E1129 Type 2 diabetes mellitus with other diabetic kidney complication: Secondary | ICD-10-CM | POA: Diagnosis not present

## 2017-05-03 DIAGNOSIS — D688 Other specified coagulation defects: Secondary | ICD-10-CM | POA: Diagnosis not present

## 2017-05-03 DIAGNOSIS — R51 Headache: Secondary | ICD-10-CM | POA: Diagnosis not present

## 2017-05-03 DIAGNOSIS — N186 End stage renal disease: Secondary | ICD-10-CM | POA: Diagnosis not present

## 2017-05-03 NOTE — Telephone Encounter (Signed)
Patient called and notified of time change. To be at [facility] admitting department 05/20/17 at 7 am for surgery. Confirmed with Melissa at [facility] to advise patient also.

## 2017-05-04 ENCOUNTER — Encounter: Payer: Self-pay | Admitting: Nephrology

## 2017-05-05 DIAGNOSIS — D509 Iron deficiency anemia, unspecified: Secondary | ICD-10-CM | POA: Diagnosis not present

## 2017-05-05 DIAGNOSIS — R51 Headache: Secondary | ICD-10-CM | POA: Diagnosis not present

## 2017-05-05 DIAGNOSIS — D631 Anemia in chronic kidney disease: Secondary | ICD-10-CM | POA: Diagnosis not present

## 2017-05-05 DIAGNOSIS — N186 End stage renal disease: Secondary | ICD-10-CM | POA: Diagnosis not present

## 2017-05-05 DIAGNOSIS — T8249XA Other complication of vascular dialysis catheter, initial encounter: Secondary | ICD-10-CM | POA: Diagnosis not present

## 2017-05-05 DIAGNOSIS — R739 Hyperglycemia, unspecified: Secondary | ICD-10-CM | POA: Diagnosis not present

## 2017-05-05 DIAGNOSIS — N2581 Secondary hyperparathyroidism of renal origin: Secondary | ICD-10-CM | POA: Diagnosis not present

## 2017-05-05 DIAGNOSIS — E1129 Type 2 diabetes mellitus with other diabetic kidney complication: Secondary | ICD-10-CM | POA: Diagnosis not present

## 2017-05-05 DIAGNOSIS — Z23 Encounter for immunization: Secondary | ICD-10-CM | POA: Diagnosis not present

## 2017-05-05 DIAGNOSIS — D688 Other specified coagulation defects: Secondary | ICD-10-CM | POA: Diagnosis not present

## 2017-05-06 ENCOUNTER — Other Ambulatory Visit: Payer: Self-pay | Admitting: Vascular Surgery

## 2017-05-07 DIAGNOSIS — D631 Anemia in chronic kidney disease: Secondary | ICD-10-CM | POA: Diagnosis not present

## 2017-05-07 DIAGNOSIS — Z23 Encounter for immunization: Secondary | ICD-10-CM | POA: Diagnosis not present

## 2017-05-07 DIAGNOSIS — R51 Headache: Secondary | ICD-10-CM | POA: Diagnosis not present

## 2017-05-07 DIAGNOSIS — E1129 Type 2 diabetes mellitus with other diabetic kidney complication: Secondary | ICD-10-CM | POA: Diagnosis not present

## 2017-05-07 DIAGNOSIS — D688 Other specified coagulation defects: Secondary | ICD-10-CM | POA: Diagnosis not present

## 2017-05-07 DIAGNOSIS — N2581 Secondary hyperparathyroidism of renal origin: Secondary | ICD-10-CM | POA: Diagnosis not present

## 2017-05-07 DIAGNOSIS — T8249XA Other complication of vascular dialysis catheter, initial encounter: Secondary | ICD-10-CM | POA: Diagnosis not present

## 2017-05-07 DIAGNOSIS — D509 Iron deficiency anemia, unspecified: Secondary | ICD-10-CM | POA: Diagnosis not present

## 2017-05-07 DIAGNOSIS — N186 End stage renal disease: Secondary | ICD-10-CM | POA: Diagnosis not present

## 2017-05-07 DIAGNOSIS — R739 Hyperglycemia, unspecified: Secondary | ICD-10-CM | POA: Diagnosis not present

## 2017-05-10 DIAGNOSIS — N186 End stage renal disease: Secondary | ICD-10-CM | POA: Diagnosis not present

## 2017-05-10 DIAGNOSIS — R739 Hyperglycemia, unspecified: Secondary | ICD-10-CM | POA: Diagnosis not present

## 2017-05-10 DIAGNOSIS — N2581 Secondary hyperparathyroidism of renal origin: Secondary | ICD-10-CM | POA: Diagnosis not present

## 2017-05-10 DIAGNOSIS — D509 Iron deficiency anemia, unspecified: Secondary | ICD-10-CM | POA: Diagnosis not present

## 2017-05-10 DIAGNOSIS — R51 Headache: Secondary | ICD-10-CM | POA: Diagnosis not present

## 2017-05-10 DIAGNOSIS — D631 Anemia in chronic kidney disease: Secondary | ICD-10-CM | POA: Diagnosis not present

## 2017-05-10 DIAGNOSIS — Z23 Encounter for immunization: Secondary | ICD-10-CM | POA: Diagnosis not present

## 2017-05-10 DIAGNOSIS — T8249XA Other complication of vascular dialysis catheter, initial encounter: Secondary | ICD-10-CM | POA: Diagnosis not present

## 2017-05-10 DIAGNOSIS — E1129 Type 2 diabetes mellitus with other diabetic kidney complication: Secondary | ICD-10-CM | POA: Diagnosis not present

## 2017-05-10 DIAGNOSIS — D688 Other specified coagulation defects: Secondary | ICD-10-CM | POA: Diagnosis not present

## 2017-05-12 DIAGNOSIS — R51 Headache: Secondary | ICD-10-CM | POA: Diagnosis not present

## 2017-05-12 DIAGNOSIS — N2581 Secondary hyperparathyroidism of renal origin: Secondary | ICD-10-CM | POA: Diagnosis not present

## 2017-05-12 DIAGNOSIS — E1129 Type 2 diabetes mellitus with other diabetic kidney complication: Secondary | ICD-10-CM | POA: Diagnosis not present

## 2017-05-12 DIAGNOSIS — Z23 Encounter for immunization: Secondary | ICD-10-CM | POA: Diagnosis not present

## 2017-05-12 DIAGNOSIS — D631 Anemia in chronic kidney disease: Secondary | ICD-10-CM | POA: Diagnosis not present

## 2017-05-12 DIAGNOSIS — T8249XA Other complication of vascular dialysis catheter, initial encounter: Secondary | ICD-10-CM | POA: Diagnosis not present

## 2017-05-12 DIAGNOSIS — N186 End stage renal disease: Secondary | ICD-10-CM | POA: Diagnosis not present

## 2017-05-12 DIAGNOSIS — D509 Iron deficiency anemia, unspecified: Secondary | ICD-10-CM | POA: Diagnosis not present

## 2017-05-12 DIAGNOSIS — R739 Hyperglycemia, unspecified: Secondary | ICD-10-CM | POA: Diagnosis not present

## 2017-05-12 DIAGNOSIS — D688 Other specified coagulation defects: Secondary | ICD-10-CM | POA: Diagnosis not present

## 2017-05-14 DIAGNOSIS — D509 Iron deficiency anemia, unspecified: Secondary | ICD-10-CM | POA: Diagnosis not present

## 2017-05-14 DIAGNOSIS — R51 Headache: Secondary | ICD-10-CM | POA: Diagnosis not present

## 2017-05-14 DIAGNOSIS — Z23 Encounter for immunization: Secondary | ICD-10-CM | POA: Diagnosis not present

## 2017-05-14 DIAGNOSIS — T8249XA Other complication of vascular dialysis catheter, initial encounter: Secondary | ICD-10-CM | POA: Diagnosis not present

## 2017-05-14 DIAGNOSIS — D688 Other specified coagulation defects: Secondary | ICD-10-CM | POA: Diagnosis not present

## 2017-05-14 DIAGNOSIS — N186 End stage renal disease: Secondary | ICD-10-CM | POA: Diagnosis not present

## 2017-05-14 DIAGNOSIS — R739 Hyperglycemia, unspecified: Secondary | ICD-10-CM | POA: Diagnosis not present

## 2017-05-14 DIAGNOSIS — E1129 Type 2 diabetes mellitus with other diabetic kidney complication: Secondary | ICD-10-CM | POA: Diagnosis not present

## 2017-05-14 DIAGNOSIS — D631 Anemia in chronic kidney disease: Secondary | ICD-10-CM | POA: Diagnosis not present

## 2017-05-14 DIAGNOSIS — N2581 Secondary hyperparathyroidism of renal origin: Secondary | ICD-10-CM | POA: Diagnosis not present

## 2017-05-17 ENCOUNTER — Other Ambulatory Visit: Payer: Self-pay

## 2017-05-17 ENCOUNTER — Encounter: Payer: Self-pay | Admitting: Student in an Organized Health Care Education/Training Program

## 2017-05-17 ENCOUNTER — Ambulatory Visit: Payer: Medicare Other | Admitting: Student in an Organized Health Care Education/Training Program

## 2017-05-17 VITALS — BP 108/64 | HR 93 | Temp 98.3°F | Ht 62.0 in | Wt 234.2 lb

## 2017-05-17 DIAGNOSIS — R21 Rash and other nonspecific skin eruption: Secondary | ICD-10-CM

## 2017-05-17 DIAGNOSIS — N186 End stage renal disease: Secondary | ICD-10-CM | POA: Diagnosis not present

## 2017-05-17 DIAGNOSIS — R739 Hyperglycemia, unspecified: Secondary | ICD-10-CM | POA: Diagnosis not present

## 2017-05-17 DIAGNOSIS — E1129 Type 2 diabetes mellitus with other diabetic kidney complication: Secondary | ICD-10-CM | POA: Diagnosis not present

## 2017-05-17 DIAGNOSIS — D688 Other specified coagulation defects: Secondary | ICD-10-CM | POA: Diagnosis not present

## 2017-05-17 DIAGNOSIS — R51 Headache: Secondary | ICD-10-CM | POA: Diagnosis not present

## 2017-05-17 DIAGNOSIS — D509 Iron deficiency anemia, unspecified: Secondary | ICD-10-CM | POA: Diagnosis not present

## 2017-05-17 DIAGNOSIS — Z23 Encounter for immunization: Secondary | ICD-10-CM | POA: Diagnosis not present

## 2017-05-17 DIAGNOSIS — D631 Anemia in chronic kidney disease: Secondary | ICD-10-CM | POA: Diagnosis not present

## 2017-05-17 DIAGNOSIS — T8249XA Other complication of vascular dialysis catheter, initial encounter: Secondary | ICD-10-CM | POA: Diagnosis not present

## 2017-05-17 DIAGNOSIS — N2581 Secondary hyperparathyroidism of renal origin: Secondary | ICD-10-CM | POA: Diagnosis not present

## 2017-05-17 LAB — POCT SKIN KOH: Skin KOH, POC: NEGATIVE

## 2017-05-17 MED ORDER — LIDOCAINE 2% (20 MG/ML) 5 ML SYRINGE
INTRAMUSCULAR | Status: AC
Start: 2017-05-17 — End: 2017-05-18
  Filled 2017-05-17: qty 10

## 2017-05-17 MED ORDER — HYDROXYZINE HCL 10 MG PO TABS: 10.0000 mg | ORAL_TABLET | Freq: Three times a day (TID) | ORAL | 0 refills | Status: DC | PRN

## 2017-05-17 MED ORDER — CLOBETASOL PROPIONATE 0.05 % EX OINT: 1.0000 "application " | TOPICAL_OINTMENT | Freq: Two times a day (BID) | CUTANEOUS | 0 refills | Status: DC

## 2017-05-17 MED ORDER — EUCERIN EX CREA: TOPICAL_CREAM | CUTANEOUS | 0 refills | Status: DC | PRN

## 2017-05-17 NOTE — Progress Notes (Signed)
   CC: Rash  HPI: Rachel Hobbs is a 77 y.o. female presenting for itchy rash.   Pruritic Rash Previously diagnosed with lichen planus and treated with kenalog ointment twice daily on 10/16. Patient reports that kenalog was used x1 month without improvement in symptoms. Rash has persisted and worsened since she was previously seen. Rash is on left shoulder, back, and abdomen as well as bilateral thighs. No new exposures: denies new medications, lotions, soaps, detergents. No fevers, URI symptoms, myalgias. No prior episodes.  Review of Symptoms:  See HPI for ROS.   CC, SH/smoking status, and VS noted.  Objective: BP 108/64   Pulse 93   Temp 98.3 F (36.8 C) (Oral)   Ht 5\' 2"  (1.575 m)   Wt 234 lb 3.2 oz (106.2 kg)   SpO2 99%   BMI 42.84 kg/m  GEN: NAD, alert, cooperative, and pleasant. SKIN: +scaly, dark lesions noted over left chest wall, upper back. Lesion is worst over abdomen below umbilicus with white flaking scales overlying. No surrounding erythema or warmth. No redness or drainage.  Assessment and plan:  Rash and nonspecific skin eruption Uncertain etiology. Previously diagnosed with lichen planus however symptoms did not improve with kenalog. No lesions between fingers or on wrists to think of scabies. KOH stain was negative. - atarax, eucerin - clobetasol x 1 week (short course only for this medication) - follow up in 2 weeks - return precautions provided if signs of overlying infection develop before that  Orders Placed This Encounter  Procedures  . POCT Skin KOH    Meds ordered this encounter  Medications  . hydrOXYzine (ATARAX/VISTARIL) 10 MG tablet    Sig: Take 1 tablet (10 mg total) by mouth 3 (three) times daily as needed.    Dispense:  30 tablet    Refill:  0  . Skin Protectants, Misc. (EUCERIN) cream    Sig: Apply topically as needed for dry skin.    Dispense:  454 g    Refill:  0  . clobetasol ointment (TEMOVATE) 0.05 %    Sig: Apply 1  application topically 2 (two) times daily. For very severe eczema.  Do not use for more than 1 week at a time.    Dispense:  60 g    Refill:  0  \ Everrett Coombe, MD,MS,  PGY2 05/17/2017 11:46 AM

## 2017-05-17 NOTE — Patient Instructions (Signed)
It was a pleasure seeing you today in our clinic. Today we discussed your itchy rash. Here is the treatment plan we have discussed and agreed upon together:   Please use the medications prescribed for your rash.  Please schedule follow up to be seen in 2 weeks.   Our clinic's number is [phone]. Please call with questions or concerns about what we discussed today.  Be well, Dr. Burr Medico

## 2017-05-17 NOTE — Assessment & Plan Note (Addendum)
Uncertain etiology. Previously diagnosed with lichen planus however symptoms did not improve with kenalog. No lesions between fingers or on wrists to think of scabies. KOH stain was negative. - atarax, eucerin - clobetasol x 1 week (short course only for this medication) - follow up in 2 weeks - return precautions provided if signs of overlying infection develop before 2 weeks

## 2017-05-18 ENCOUNTER — Telehealth: Payer: Self-pay | Admitting: Family Medicine

## 2017-05-18 NOTE — Telephone Encounter (Signed)
Has gout in toe. Would like a Rx sent to [facility] on [address]

## 2017-05-18 NOTE — Telephone Encounter (Signed)
Pt called again about her gout medicine.

## 2017-05-19 ENCOUNTER — Telehealth: Payer: Self-pay | Admitting: *Deleted

## 2017-05-19 ENCOUNTER — Other Ambulatory Visit: Payer: Self-pay

## 2017-05-19 ENCOUNTER — Encounter (HOSPITAL_COMMUNITY): Payer: Self-pay | Admitting: *Deleted

## 2017-05-19 ENCOUNTER — Other Ambulatory Visit: Payer: Self-pay | Admitting: *Deleted

## 2017-05-19 DIAGNOSIS — D631 Anemia in chronic kidney disease: Secondary | ICD-10-CM | POA: Diagnosis not present

## 2017-05-19 DIAGNOSIS — Z23 Encounter for immunization: Secondary | ICD-10-CM | POA: Diagnosis not present

## 2017-05-19 DIAGNOSIS — R51 Headache: Secondary | ICD-10-CM | POA: Diagnosis not present

## 2017-05-19 DIAGNOSIS — N186 End stage renal disease: Secondary | ICD-10-CM | POA: Diagnosis not present

## 2017-05-19 DIAGNOSIS — D509 Iron deficiency anemia, unspecified: Secondary | ICD-10-CM | POA: Diagnosis not present

## 2017-05-19 DIAGNOSIS — R739 Hyperglycemia, unspecified: Secondary | ICD-10-CM | POA: Diagnosis not present

## 2017-05-19 DIAGNOSIS — T8249XA Other complication of vascular dialysis catheter, initial encounter: Secondary | ICD-10-CM | POA: Diagnosis not present

## 2017-05-19 DIAGNOSIS — N2581 Secondary hyperparathyroidism of renal origin: Secondary | ICD-10-CM | POA: Diagnosis not present

## 2017-05-19 DIAGNOSIS — D688 Other specified coagulation defects: Secondary | ICD-10-CM | POA: Diagnosis not present

## 2017-05-19 DIAGNOSIS — E1129 Type 2 diabetes mellitus with other diabetic kidney complication: Secondary | ICD-10-CM | POA: Diagnosis not present

## 2017-05-19 DIAGNOSIS — Z992 Dependence on renal dialysis: Secondary | ICD-10-CM | POA: Diagnosis not present

## 2017-05-19 NOTE — Telephone Encounter (Signed)
Patient called to inform her that surgery will be done by Dr. Donnetta Hutching.

## 2017-05-19 NOTE — Progress Notes (Signed)
Spoke with pt for pre-op call. Pt denies any cardiac history, chest pain or sob. Pt is a type 2 diabetic. Last A1C was 7.7 on 10/2016. Pt states her fasting blood sugar is usually between 90-100. Instructed patient to take 1/2 of her regular dose of Lantus this evening. Instructed pt to check her blood sugar in the AM when she gets up and every 2 hours until she leaves for the hospital. If blood sugar is 70 or below, treat with 1/2 cup of clear juice (apple or cranberry) and recheck blood sugar 15 minutes after drinking juice. If blood sugar continues to be 70 or below, call the Short Stay department and ask to speak to a nurse.

## 2017-05-20 ENCOUNTER — Ambulatory Visit (HOSPITAL_COMMUNITY): Payer: Medicare Other | Admitting: Anesthesiology

## 2017-05-20 ENCOUNTER — Ambulatory Visit (HOSPITAL_COMMUNITY)
Admission: RE | Admit: 2017-05-20 | Discharge: 2017-05-20 | Disposition: A | Discharge status: Home / Self Care | Payer: Medicare Other | Source: Ambulatory Visit | Attending: Vascular Surgery | Admitting: Vascular Surgery

## 2017-05-20 ENCOUNTER — Encounter (HOSPITAL_COMMUNITY)
Admission: RE | Disposition: A | Discharge status: Home / Self Care | Payer: Self-pay | Source: Ambulatory Visit | Attending: Vascular Surgery

## 2017-05-20 ENCOUNTER — Encounter (HOSPITAL_COMMUNITY): Payer: Self-pay | Admitting: *Deleted

## 2017-05-20 DIAGNOSIS — Z6841 Body Mass Index (BMI) 40.0 and over, adult: Secondary | ICD-10-CM | POA: Diagnosis not present

## 2017-05-20 DIAGNOSIS — E785 Hyperlipidemia, unspecified: Secondary | ICD-10-CM | POA: Diagnosis not present

## 2017-05-20 DIAGNOSIS — Z992 Dependence on renal dialysis: Secondary | ICD-10-CM | POA: Insufficient documentation

## 2017-05-20 DIAGNOSIS — Z7982 Long term (current) use of aspirin: Secondary | ICD-10-CM | POA: Diagnosis not present

## 2017-05-20 DIAGNOSIS — Z79899 Other long term (current) drug therapy: Secondary | ICD-10-CM | POA: Diagnosis not present

## 2017-05-20 DIAGNOSIS — T82590A Other mechanical complication of surgically created arteriovenous fistula, initial encounter: Secondary | ICD-10-CM | POA: Diagnosis not present

## 2017-05-20 DIAGNOSIS — E1151 Type 2 diabetes mellitus with diabetic peripheral angiopathy without gangrene: Secondary | ICD-10-CM | POA: Insufficient documentation

## 2017-05-20 DIAGNOSIS — I129 Hypertensive chronic kidney disease with stage 1 through stage 4 chronic kidney disease, or unspecified chronic kidney disease: Secondary | ICD-10-CM | POA: Diagnosis not present

## 2017-05-20 DIAGNOSIS — Y832 Surgical operation with anastomosis, bypass or graft as the cause of abnormal reaction of the patient, or of later complication, without mention of misadventure at the time of the procedure: Secondary | ICD-10-CM | POA: Insufficient documentation

## 2017-05-20 DIAGNOSIS — E1122 Type 2 diabetes mellitus with diabetic chronic kidney disease: Secondary | ICD-10-CM | POA: Insufficient documentation

## 2017-05-20 DIAGNOSIS — Z794 Long term (current) use of insulin: Secondary | ICD-10-CM | POA: Insufficient documentation

## 2017-05-20 DIAGNOSIS — N186 End stage renal disease: Secondary | ICD-10-CM | POA: Insufficient documentation

## 2017-05-20 DIAGNOSIS — I5032 Chronic diastolic (congestive) heart failure: Secondary | ICD-10-CM | POA: Diagnosis not present

## 2017-05-20 DIAGNOSIS — Z841 Family history of disorders of kidney and ureter: Secondary | ICD-10-CM | POA: Insufficient documentation

## 2017-05-20 DIAGNOSIS — I132 Hypertensive heart and chronic kidney disease with heart failure and with stage 5 chronic kidney disease, or end stage renal disease: Secondary | ICD-10-CM | POA: Diagnosis not present

## 2017-05-20 DIAGNOSIS — E1129 Type 2 diabetes mellitus with other diabetic kidney complication: Secondary | ICD-10-CM | POA: Diagnosis not present

## 2017-05-20 HISTORY — DX: Pneumonia, unspecified organism: J18.9

## 2017-05-20 HISTORY — PX: AV FISTULA PLACEMENT: SHX1204

## 2017-05-20 LAB — GLUCOSE, CAPILLARY
GLUCOSE-CAPILLARY: 302 mg/dL — AB (ref 65–99)
GLUCOSE-CAPILLARY: 273 mg/dL — AB (ref 65–99)
GLUCOSE-CAPILLARY: 135 mg/dL — AB (ref 65–99)
GLUCOSE-CAPILLARY: 98 mg/dL (ref 65–99)
Glucose-Capillary: 229 mg/dL — ABNORMAL HIGH (ref 65–99)
Glucose-Capillary: 146 mg/dL — ABNORMAL HIGH (ref 65–99)
Glucose-Capillary: 123 mg/dL — ABNORMAL HIGH (ref 65–99)

## 2017-05-20 LAB — POCT I-STAT 4, (NA,K, GLUC, HGB,HCT)
GLUCOSE: 328 mg/dL — AB (ref 65–99)
HEMATOCRIT: 34 % — AB (ref 36.0–46.0)
HEMOGLOBIN: 11.6 g/dL — AB (ref 12.0–15.0)
POTASSIUM: 4.1 mmol/L (ref 3.5–5.1)
Sodium: 137 mmol/L (ref 135–145)

## 2017-05-20 SURGERY — INSERTION OF ARTERIOVENOUS (AV) GORE-TEX GRAFT ARM
Anesthesia: Monitor Anesthesia Care | Site: Arm Upper | Laterality: Left

## 2017-05-20 MED ORDER — SODIUM CHLORIDE 0.9 % IV SOLN
INTRAVENOUS | Status: DC
  Administered 2017-05-20: 5.1 [IU]/h via INTRAVENOUS
  Filled 2017-05-20 (×2): qty 1

## 2017-05-20 MED ORDER — INSULIN ASPART 100 UNIT/ML ~~LOC~~ SOLN: 0.0000 [IU] | SUBCUTANEOUS | Status: DC

## 2017-05-20 MED ORDER — LIDOCAINE-EPINEPHRINE 0.5 %-1:200000 IJ SOLN
INTRAMUSCULAR | Status: AC
  Filled 2017-05-20: qty 1

## 2017-05-20 MED ORDER — MIDAZOLAM HCL 5 MG/5ML IJ SOLN
INTRAMUSCULAR | Status: DC | PRN
  Administered 2017-05-20 (×2): 1 mg via INTRAVENOUS

## 2017-05-20 MED ORDER — PROPOFOL 10 MG/ML IV BOLUS
INTRAVENOUS | Status: DC | PRN
  Administered 2017-05-20: 20 mg via INTRAVENOUS

## 2017-05-20 MED ORDER — ONDANSETRON HCL 4 MG/2ML IJ SOLN
INTRAMUSCULAR | Status: AC
  Filled 2017-05-20: qty 2

## 2017-05-20 MED ORDER — SODIUM CHLORIDE 0.9 % IV SOLN
INTRAVENOUS | Status: DC | PRN
  Administered 2017-05-20: 10:00:00

## 2017-05-20 MED ORDER — PHENYLEPHRINE HCL 10 MG/ML IJ SOLN
INTRAMUSCULAR | Status: DC | PRN
  Administered 2017-05-20: 80 ug via INTRAVENOUS
  Administered 2017-05-20: 40 ug via INTRAVENOUS
  Administered 2017-05-20: 80 ug via INTRAVENOUS

## 2017-05-20 MED ORDER — DEXTROSE-NACL 5-0.45 % IV SOLN
INTRAVENOUS | Status: DC
  Administered 2017-05-20: 10:00:00 via INTRAVENOUS

## 2017-05-20 MED ORDER — SODIUM CHLORIDE 0.9 % IV SOLN
INTRAVENOUS | Status: DC | PRN
  Administered 2017-05-20: 09:00:00 via INTRAVENOUS

## 2017-05-20 MED ORDER — SODIUM CHLORIDE 0.9 % IV SOLN
INTRAVENOUS | Status: DC
  Administered 2017-05-20: 07:00:00 via INTRAVENOUS

## 2017-05-20 MED ORDER — DEXTROSE 5 % IV SOLN
1.5000 g | INTRAVENOUS | Status: AC
  Administered 2017-05-20: 1.5 g via INTRAVENOUS
  Filled 2017-05-20: qty 1.5

## 2017-05-20 MED ORDER — PROPOFOL 500 MG/50ML IV EMUL
INTRAVENOUS | Status: DC | PRN
  Administered 2017-05-20: 25 ug/kg/min via INTRAVENOUS

## 2017-05-20 MED ORDER — MIDAZOLAM HCL 2 MG/2ML IJ SOLN
INTRAMUSCULAR | Status: AC
  Filled 2017-05-20: qty 2

## 2017-05-20 MED ORDER — ONDANSETRON HCL 4 MG/2ML IJ SOLN: INTRAMUSCULAR | Status: DC | PRN

## 2017-05-20 MED ORDER — OXYCODONE-ACETAMINOPHEN 5-325 MG PO TABS: 1.0000 | ORAL_TABLET | Freq: Four times a day (QID) | ORAL | 0 refills | Status: DC | PRN

## 2017-05-20 MED ORDER — FENTANYL CITRATE (PF) 250 MCG/5ML IJ SOLN
INTRAMUSCULAR | Status: AC
  Filled 2017-05-20: qty 5

## 2017-05-20 MED ORDER — DEXTROSE 50 % IV SOLN: 25.0000 mL | INTRAVENOUS | Status: DC | PRN

## 2017-05-20 MED ORDER — LIDOCAINE-EPINEPHRINE 0.5 %-1:200000 IJ SOLN
INTRAMUSCULAR | Status: DC | PRN
  Administered 2017-05-20: 50 mL

## 2017-05-20 MED ORDER — 0.9 % SODIUM CHLORIDE (POUR BTL) OPTIME
TOPICAL | Status: DC | PRN
  Administered 2017-05-20: 1000 mL

## 2017-05-20 MED ORDER — DEXAMETHASONE SODIUM PHOSPHATE 10 MG/ML IJ SOLN
INTRAMUSCULAR | Status: AC
  Filled 2017-05-20: qty 1

## 2017-05-20 MED ORDER — FENTANYL CITRATE (PF) 100 MCG/2ML IJ SOLN
INTRAMUSCULAR | Status: DC | PRN
  Administered 2017-05-20: 25 ug via INTRAVENOUS

## 2017-05-20 MED ORDER — SODIUM CHLORIDE 0.9 % IV SOLN: INTRAVENOUS | Status: DC

## 2017-05-20 MED ORDER — PROMETHAZINE HCL 25 MG/ML IJ SOLN: 6.2500 mg | INTRAMUSCULAR | Status: DC | PRN

## 2017-05-20 MED ORDER — PHENYLEPHRINE 40 MCG/ML (10ML) SYRINGE FOR IV PUSH (FOR BLOOD PRESSURE SUPPORT)
PREFILLED_SYRINGE | INTRAVENOUS | Status: AC
  Filled 2017-05-20: qty 10

## 2017-05-20 MED ORDER — FENTANYL CITRATE (PF) 100 MCG/2ML IJ SOLN: 25.0000 ug | INTRAMUSCULAR | Status: DC | PRN

## 2017-05-20 MED ORDER — INSULIN REGULAR BOLUS VIA INFUSION
0.0000 [IU] | Freq: Three times a day (TID) | INTRAVENOUS | Status: DC
  Filled 2017-05-20: qty 10

## 2017-05-20 SURGICAL SUPPLY — 36 items
ARMBAND PINK RESTRICT EXTREMIT (MISCELLANEOUS) ×6 IMPLANT
CANISTER SUCT 3000ML PPV (MISCELLANEOUS) ×3 IMPLANT
CANNULA VESSEL 3MM 2 BLNT TIP (CANNULA) ×3 IMPLANT
CLIP LIGATING EXTRA MED SLVR (CLIP) ×3 IMPLANT
CLIP LIGATING EXTRA SM BLUE (MISCELLANEOUS) ×3 IMPLANT
DECANTER SPIKE VIAL GLASS SM (MISCELLANEOUS) ×3 IMPLANT
DERMABOND ADVANCED (GAUZE/BANDAGES/DRESSINGS) ×2
DERMABOND ADVANCED .7 DNX12 (GAUZE/BANDAGES/DRESSINGS) ×1 IMPLANT
ELECT REM PT RETURN 9FT ADLT (ELECTROSURGICAL) ×3
ELECTRODE REM PT RTRN 9FT ADLT (ELECTROSURGICAL) ×1 IMPLANT
GAUZE SPONGE 4X4 12PLY STRL (GAUZE/BANDAGES/DRESSINGS) ×3 IMPLANT
GLOVE BIOGEL M 6.5 STRL (GLOVE) ×6 IMPLANT
GLOVE BIOGEL PI IND STRL 7.0 (GLOVE) ×2 IMPLANT
GLOVE BIOGEL PI IND STRL 8 (GLOVE) ×1 IMPLANT
GLOVE BIOGEL PI INDICATOR 7.0 (GLOVE) ×4
GLOVE BIOGEL PI INDICATOR 8 (GLOVE) ×2
GLOVE SS BIOGEL STRL SZ 7.5 (GLOVE) ×1 IMPLANT
GLOVE SUPERSENSE BIOGEL SZ 7.5 (GLOVE) ×2
GOWN STRL REUS W/ TWL LRG LVL3 (GOWN DISPOSABLE) ×5 IMPLANT
GOWN STRL REUS W/TWL LRG LVL3 (GOWN DISPOSABLE) ×10
GRAFT GORETEX STRT 4-7X45 (Vascular Products) ×3 IMPLANT
KIT BASIN OR (CUSTOM PROCEDURE TRAY) ×3 IMPLANT
KIT ROOM TURNOVER OR (KITS) ×3 IMPLANT
NEEDLE HYPO 25GX1X1/2 BEV (NEEDLE) ×3 IMPLANT
NS IRRIG 1000ML POUR BTL (IV SOLUTION) ×3 IMPLANT
PACK CV ACCESS (CUSTOM PROCEDURE TRAY) ×3 IMPLANT
PAD ARMBOARD 7.5X6 YLW CONV (MISCELLANEOUS) ×6 IMPLANT
SUT PROLENE 6 0 CC (SUTURE) ×6 IMPLANT
SUT SILK 2 0 PERMA HAND 18 BK (SUTURE) IMPLANT
SUT SILK 2 0 SH (SUTURE) ×3 IMPLANT
SUT VIC AB 3-0 SH 27 (SUTURE) ×4
SUT VIC AB 3-0 SH 27X BRD (SUTURE) ×2 IMPLANT
SYR TOOMEY 50ML (SYRINGE) IMPLANT
TOWEL GREEN STERILE (TOWEL DISPOSABLE) ×3 IMPLANT
UNDERPAD 30X30 (UNDERPADS AND DIAPERS) ×3 IMPLANT
WATER STERILE IRR 1000ML POUR (IV SOLUTION) ×3 IMPLANT

## 2017-05-20 NOTE — Progress Notes (Signed)
   05/20/17 0709  OBSTRUCTIVE SLEEP APNEA  Have you ever been diagnosed with sleep apnea through a sleep study? No  Do you snore loudly (loud enough to be heard through closed doors)?  0  Do you often feel tired, fatigued, or sleepy during the daytime (such as falling asleep during driving or talking to someone)? 0  Has anyone observed you stop breathing during your sleep? 0  Do you have, or are you being treated for high blood pressure? 1  BMI more than 35 kg/m2? 1  Age > 64 (1-yes) 1  Female Gender (Yes=1) 0  Obstructive Sleep Apnea Score 3  Score 5 or greater  Results sent to PCP

## 2017-05-20 NOTE — Anesthesia Procedure Notes (Signed)
Procedure Name: MAC Date/Time: 05/20/2017 9:45 AM Performed by: Jenne Campus, CRNA Pre-anesthesia Checklist: Patient identified, Emergency Drugs available, Suction available and Patient being monitored Oxygen Delivery Method: Simple face mask

## 2017-05-20 NOTE — Anesthesia Preprocedure Evaluation (Addendum)
Anesthesia Evaluation  Patient identified by MRN, date of birth, ID band Patient awake    Reviewed: Allergy & Precautions, NPO status , Patient's Chart, lab work & pertinent test results  History of Anesthesia Complications Negative for: history of anesthetic complications  Airway Mallampati: II  TM Distance: >3 FB Neck ROM: Full    Dental  (+) Dental Advisory Given, Edentulous Upper, Edentulous Lower   Pulmonary shortness of breath,    breath sounds clear to auscultation       Cardiovascular hypertension, + Peripheral Vascular Disease and +CHF   Rhythm:Regular Rate:Normal     Neuro/Psych PSYCHIATRIC DISORDERS Depression    GI/Hepatic   Endo/Other  diabetesMorbid obesity  Renal/GU ESRF and DialysisRenal disease     Musculoskeletal  (+) Arthritis ,   Abdominal   Peds  Hematology   Anesthesia Other Findings   Reproductive/Obstetrics                            Anesthesia Physical  Anesthesia Plan  ASA: III  Anesthesia Plan: MAC   Post-op Pain Management:    Induction: Intravenous  PONV Risk Score and Plan: 2 and Ondansetron and Propofol infusion  Airway Management Planned: Natural Airway, Nasal Cannula and Simple Face Mask  Additional Equipment: None  Intra-op Plan:   Post-operative Plan:   Informed Consent: I have reviewed the patients History and Physical, chart, labs and discussed the procedure including the risks, benefits and alternatives for the proposed anesthesia with the patient or authorized representative who has indicated his/her understanding and acceptance.   Dental advisory given  Plan Discussed with: CRNA and Anesthesiologist  Anesthesia Plan Comments:        Anesthesia Quick Evaluation

## 2017-05-20 NOTE — Interval H&P Note (Signed)
History and Physical Interval Note:  05/20/2017 7:20 AM  Rachel Hobbs  has presented today for surgery, with the diagnosis of END STAGE RENAL DISEASE FOR HEMODIALYSIS ACCESS  The various methods of treatment have been discussed with the patient and family. After consideration of risks, benefits and other options for treatment, the patient has consented to  Procedure(s): INSERTION OF ARTERIOVENOUS (AV) GORE-TEX GRAFT ARM LEFT UPPER ARM (Left) as a surgical intervention .  The patient's history has been reviewed, patient examined, no change in status, stable for surgery.  I have reviewed the patient's chart and labs.  Questions were answered to the patient's satisfaction.     Curt Jews

## 2017-05-20 NOTE — Transfer of Care (Signed)
Immediate Anesthesia Transfer of Care Note  Patient: Rachel Hobbs  Procedure(s) Performed: INSERTION OF ARTERIOVENOUS (AV) GORE-TEX VASCULAR STRETCH 4-7 GRAFT ARM LEFT UPPER ARM (Left Arm Upper)  Patient Location: PACU  Anesthesia Type:MAC  Level of Consciousness: patient cooperative and responds to stimulation  Airway & Oxygen Therapy: Patient Spontanous Breathing  Post-op Assessment: Report given to RN and Post -op Vital signs reviewed and stable  Post vital signs: Reviewed and stable  Last Vitals:  Vitals:   05/20/17 0654  BP: (!) 136/55  Pulse: 91  Resp: 20  Temp: 36.8 C  SpO2: 99%    Last Pain:  Vitals:   05/20/17 0654  TempSrc: Oral      Patients Stated Pain Goal: 5 (29/93/71 6967)  Complications: No apparent anesthesia complications

## 2017-05-20 NOTE — Discharge Instructions (Signed)
° °  [facility]  Discharge Instructions  AV Fistula or Graft Surgery for Dialysis Access  Please refer to the following instructions for your post-procedure care. Your surgeon or physician assistant will discuss any changes with you.  Activity  You may drive the day following your surgery, if you are comfortable and no longer taking prescription pain medication. Resume full activity as the soreness in your incision resolves.  Bathing/Showering  You may shower after you go home. Keep your incision dry for 48 hours. Do not soak in a bathtub, hot tub, or swim until the incision heals completely. You may not shower if you have a hemodialysis catheter.  Incision Care  Clean your incision with mild soap and water after 48 hours. Pat the area dry with a clean towel. You do not need a bandage unless otherwise instructed. Do not apply any ointments or creams to your incision. You may have skin glue on your incision. Do not peel it off. It will come off on its own in about one week. Your arm may swell a bit after surgery. To reduce swelling use pillows to elevate your arm so it is above your heart. Your doctor will tell you if you need to lightly wrap your arm with an ACE bandage.  Diet  Resume your normal diet. There are not special food restrictions following this procedure. In order to heal from your surgery, it is CRITICAL to get adequate nutrition. Your body requires vitamins, minerals, and protein. Vegetables are the best source of vitamins and minerals. Vegetables also provide the perfect balance of protein. Processed food has little nutritional value, so try to avoid this.  Medications  Resume taking all of your medications. If your incision is causing pain, you may take over-the counter pain relievers such as acetaminophen (Tylenol). If you were prescribed a stronger pain medication, please be aware these medications can cause nausea and constipation. Prevent  nausea by taking the medication with a snack or meal. Avoid constipation by drinking plenty of fluids and eating foods with high amount of fiber, such as fruits, vegetables, and grains.  Do not take Tylenol if you are taking prescription pain medications.  Follow up Your surgeon may want to see you in the office following your access surgery. If so, this will be arranged at the time of your surgery.  Please call us immediately for any of the following conditions:  Increased pain, redness, drainage (pus) from your incision site Fever of 101 degrees or higher Severe or worsening pain at your incision site Hand pain or numbness.  Reduce your risk of vascular disease:  Stop smoking. If you would like help, call [facility] at 1-800-QUIT-NOW 8032216675) or [place] at [place] your cholesterol Maintain a desired weight Control your diabetes Keep your blood pressure down  Dialysis  It will take several weeks to several months for your new dialysis access to be ready for use. Your surgeon will determine when it is OK to use it. Your nephrologist will continue to direct your dialysis. You can continue to use your Permcath until your new access is ready for use.   05/20/2017 Rachel Hobbs [id number] Sep 05, 1940  Surgeon(s): Early, Arvilla Meres, MD  Procedure(s): INSERTION OF ARTERIOVENOUS (AV) GORE-TEX VASCULAR STRETCH 4-7 GRAFT ARM LEFT UPPER ARM  x Do not stick graft for 4 weeks    If you have any questions, please call the office at [phone].

## 2017-05-20 NOTE — Anesthesia Postprocedure Evaluation (Signed)
Anesthesia Post Note  Patient: Rachel Hobbs  Procedure(s) Performed: INSERTION OF ARTERIOVENOUS (AV) GORE-TEX VASCULAR STRETCH 4-7 GRAFT ARM LEFT UPPER ARM (Left Arm Upper)     Patient location during evaluation: PACU Anesthesia Type: MAC Level of consciousness: awake and alert Pain management: pain level controlled Vital Signs Assessment: post-procedure vital signs reviewed and stable Respiratory status: spontaneous breathing and respiratory function stable Cardiovascular status: stable Postop Assessment: no apparent nausea or vomiting Anesthetic complications: no    Last Vitals:  Vitals:   05/20/17 1230 05/20/17 1245  BP: (!) 134/55 (!) 139/58  Pulse: 91 92  Resp: (!) 32 (!) 23  Temp:  36.7 C  SpO2: 97% 99%    Last Pain:  Vitals:   05/20/17 1230  TempSrc:   PainSc: 0-No pain                 Anylah Scheib DANIEL

## 2017-05-21 DIAGNOSIS — R51 Headache: Secondary | ICD-10-CM | POA: Diagnosis not present

## 2017-05-21 DIAGNOSIS — T8249XA Other complication of vascular dialysis catheter, initial encounter: Secondary | ICD-10-CM | POA: Diagnosis not present

## 2017-05-21 DIAGNOSIS — N2581 Secondary hyperparathyroidism of renal origin: Secondary | ICD-10-CM | POA: Diagnosis not present

## 2017-05-21 DIAGNOSIS — D631 Anemia in chronic kidney disease: Secondary | ICD-10-CM | POA: Diagnosis not present

## 2017-05-21 DIAGNOSIS — D688 Other specified coagulation defects: Secondary | ICD-10-CM | POA: Diagnosis not present

## 2017-05-21 DIAGNOSIS — E1129 Type 2 diabetes mellitus with other diabetic kidney complication: Secondary | ICD-10-CM | POA: Diagnosis not present

## 2017-05-21 DIAGNOSIS — M109 Gout, unspecified: Secondary | ICD-10-CM | POA: Diagnosis not present

## 2017-05-21 DIAGNOSIS — D509 Iron deficiency anemia, unspecified: Secondary | ICD-10-CM | POA: Diagnosis not present

## 2017-05-21 DIAGNOSIS — N186 End stage renal disease: Secondary | ICD-10-CM | POA: Diagnosis not present

## 2017-05-23 ENCOUNTER — Encounter (HOSPITAL_COMMUNITY): Payer: Self-pay | Admitting: Vascular Surgery

## 2017-05-23 DIAGNOSIS — N185 Chronic kidney disease, stage 5: Secondary | ICD-10-CM | POA: Diagnosis not present

## 2017-05-23 NOTE — Op Note (Signed)
    OPERATIVE REPORT  DATE OF SURGERY: 05/23/2017  PATIENT: Rachel Hobbs, 77 y.o. female MRN: [id number]  DOB: October 31, 1940  PRE-OPERATIVE DIAGNOSIS: End-stage renal disease  POST-OPERATIVE DIAGNOSIS:  Same  PROCEDURE: Left upper arm AV Gore-Tex graft  SURGEON:  Curt Jews, M.D.  PHYSICIAN ASSISTANT: Liana Crocker PA-C  ANESTHESIA: MAC  EBL: Minimal ml  No intake/output data recorded.  BLOOD ADMINISTERED: None  DRAINS: None  SPECIMEN: None  COUNTS CORRECT:  YES  PLAN OF CARE: PACU  PATIENT DISPOSITION:  PACU - hemodynamically stable  PROCEDURE DETAILS: Patient had a upper arm fistula which was thrombosed and tenting of her axillary vein.  Recommend that she undergo a left upper arm graft.  Made an incision using local anesthesia the antecubital space and carried down her prior arterial to venous anastomosis.  The artery was the veins at this area were very small and and outflow for a forearm loop.  Incision was made over the axilla over the pulse and the stent could easily be palpated extending from the upper arm into the axilla.  The artery was exposed and there was a second venous branch around the artery that was adequate size for outflow.  A tunnel was created from the level of the antecubital incision to the axillary incision and 4-7 taper Gore-Tex graft was brought to the tunnel.  The brachial artery was occluded proximally distally old anastomosis and the old and sewn end-to-side to the artery with a running 6-0 Prolene suture.  This anastomosis was tested and found to be adequate.  The graft was then flushed with heparinized saline and reoccluded.  The graft was cut to the appropriate length and a the vein was occluded proximally distally and was opened with an 11 blade and extended longitudinally with positive to the graft was sewn end-to-side to the vein with a running 6-0 Prolene suture.  Clamps removed and good thrill was noted.  The wounds irrigated with saline.   Cautery.  Wounds were closed with 3-0 Vicryl in the subcutaneous and subcuticular tissue.  Sterile dressing was applied and the patient was transferred to the recovery room in stable condition   [name], M.D., [facility] 05/23/2017 3:40 PM

## 2017-05-24 DIAGNOSIS — E1129 Type 2 diabetes mellitus with other diabetic kidney complication: Secondary | ICD-10-CM | POA: Diagnosis not present

## 2017-05-24 DIAGNOSIS — M109 Gout, unspecified: Secondary | ICD-10-CM | POA: Diagnosis not present

## 2017-05-24 DIAGNOSIS — N186 End stage renal disease: Secondary | ICD-10-CM | POA: Diagnosis not present

## 2017-05-24 DIAGNOSIS — N2581 Secondary hyperparathyroidism of renal origin: Secondary | ICD-10-CM | POA: Diagnosis not present

## 2017-05-24 DIAGNOSIS — T8249XA Other complication of vascular dialysis catheter, initial encounter: Secondary | ICD-10-CM | POA: Diagnosis not present

## 2017-05-24 DIAGNOSIS — D688 Other specified coagulation defects: Secondary | ICD-10-CM | POA: Diagnosis not present

## 2017-05-24 DIAGNOSIS — D631 Anemia in chronic kidney disease: Secondary | ICD-10-CM | POA: Diagnosis not present

## 2017-05-24 DIAGNOSIS — R51 Headache: Secondary | ICD-10-CM | POA: Diagnosis not present

## 2017-05-24 DIAGNOSIS — D509 Iron deficiency anemia, unspecified: Secondary | ICD-10-CM | POA: Diagnosis not present

## 2017-05-24 NOTE — Telephone Encounter (Signed)
Jazmin,  Please could you check with patient if she is still having gout pain. If so, she needs to make an appointment to be seen in clinic for further evaluation. You cannot just a prescription for gout.  Thanks  Marjie Skiff, MD [place], PGY-2

## 2017-05-25 NOTE — Telephone Encounter (Signed)
Spoke with patient and made her an appt for Friday to discuss her foot pain. Teaghan Melrose,CMA

## 2017-05-26 DIAGNOSIS — D509 Iron deficiency anemia, unspecified: Secondary | ICD-10-CM | POA: Diagnosis not present

## 2017-05-26 DIAGNOSIS — N2581 Secondary hyperparathyroidism of renal origin: Secondary | ICD-10-CM | POA: Diagnosis not present

## 2017-05-26 DIAGNOSIS — D631 Anemia in chronic kidney disease: Secondary | ICD-10-CM | POA: Diagnosis not present

## 2017-05-26 DIAGNOSIS — T8249XA Other complication of vascular dialysis catheter, initial encounter: Secondary | ICD-10-CM | POA: Diagnosis not present

## 2017-05-26 DIAGNOSIS — M109 Gout, unspecified: Secondary | ICD-10-CM | POA: Diagnosis not present

## 2017-05-26 DIAGNOSIS — E1129 Type 2 diabetes mellitus with other diabetic kidney complication: Secondary | ICD-10-CM | POA: Diagnosis not present

## 2017-05-26 DIAGNOSIS — D688 Other specified coagulation defects: Secondary | ICD-10-CM | POA: Diagnosis not present

## 2017-05-26 DIAGNOSIS — R51 Headache: Secondary | ICD-10-CM | POA: Diagnosis not present

## 2017-05-26 DIAGNOSIS — N186 End stage renal disease: Secondary | ICD-10-CM | POA: Diagnosis not present

## 2017-05-27 ENCOUNTER — Ambulatory Visit (INDEPENDENT_AMBULATORY_CARE_PROVIDER_SITE_OTHER): Payer: Medicare Other | Admitting: Family Medicine

## 2017-05-27 ENCOUNTER — Encounter: Payer: Self-pay | Admitting: Family Medicine

## 2017-05-27 ENCOUNTER — Other Ambulatory Visit: Payer: Self-pay

## 2017-05-27 DIAGNOSIS — M109 Gout, unspecified: Secondary | ICD-10-CM | POA: Diagnosis not present

## 2017-05-27 MED ORDER — PREDNISONE 20 MG PO TABS
20.0000 mg | ORAL_TABLET | Freq: Two times a day (BID) | ORAL | 0 refills | Status: DC
Start: 2017-05-27 — End: 2017-09-23

## 2017-05-27 NOTE — Patient Instructions (Signed)
You were seen in clinic for foot pain.  Your symptoms are most likely related to an acute gout flare.  I do not suspect a joint infection at this time.  I am sending in a prescription for an oral steroid for you to take for the next 5 days.  Please take this twice daily with meals.  Additionally, you can take Tylenol for pain.  I would like for you to follow-up in clinic if you have any new or worsening symptoms.  Please call clinic if you have any questions.  Be well, Lovenia Kim, MD

## 2017-05-27 NOTE — Progress Notes (Signed)
   Subjective:   Patient ID: Rachel Hobbs    DOB: 1940-10-09, 77 y.o. female   MRN: [id number]  CC: foot pain   Rachel Hobbs is a 77 y.o. female with a history of gout, stage 5 CKD not on dialysis, and CHF presenting for painful great toe and bilateral foot pain.   Foot pain Bilateral but L>R.  Has had on and off pain related to gout for about 1-2 years, with ~7 days of recent worsening.  Pain is similar to her gout pain in the past.  She states she has had an injection in the past which did not provide much relied.  Pain is worse at night.  Worse with weight bearing and this causes her to limp. She uses a cane to assist with ambulation.   No recent falls or trauma. Pain is sharp and "nerve-like" and comes and goes.  Has been taking Tylenol prn for pain as she has advanced kidney disease and goes to HD T-Th-Sat.    ROS: No fevers, chills, nausea, vomiting.   Social: pt is a non-smoker  Medications reviewed.  Objective:   BP 100/60   Pulse 89   Temp 97.8 F (36.6 C) (Oral)   Wt 229 lb (103.9 kg)   SpO2 99%   BMI 41.88 kg/m  Vitals and nursing note reviewed.  General: pleasant 77 yo F, NAD  CV:RRR no MRG  Lungs: CTAB, normal effort  Skin: warm, dry Extremities: warm and well perfused  L foot: L great toe is exquisitely TTP with mild erythema and swelling.  Overlying onychomycosis present.  2+ pedal pulses present and sensation normal.   Assessment & Plan:   GOUT, ACUTE Exam without concern for septic joint at this time.  She is afebrile and otherwise well-appearing.  Would suspect acute gout of great L toe.  -Recommend Tylenol in setting of Stage V CKD, may use prn for pain  -Rx: prednisone steroid burst; 20 mg BID x5 days -Return precautions discussed   Meds ordered this encounter  Medications  . predniSONE (DELTASONE) 20 MG tablet    Sig: Take 1 tablet (20 mg total) by mouth 2 (two) times daily with a meal.    Dispense:  10 tablet    Refill:  0   Follow-up: if  symptoms worsen or do not improve   Lovenia Kim, MD [place], PGY-2 06/01/2017 7:25 PM

## 2017-05-28 DIAGNOSIS — M109 Gout, unspecified: Secondary | ICD-10-CM | POA: Diagnosis not present

## 2017-05-28 DIAGNOSIS — T8249XA Other complication of vascular dialysis catheter, initial encounter: Secondary | ICD-10-CM | POA: Diagnosis not present

## 2017-05-28 DIAGNOSIS — N2581 Secondary hyperparathyroidism of renal origin: Secondary | ICD-10-CM | POA: Diagnosis not present

## 2017-05-28 DIAGNOSIS — N186 End stage renal disease: Secondary | ICD-10-CM | POA: Diagnosis not present

## 2017-05-28 DIAGNOSIS — D509 Iron deficiency anemia, unspecified: Secondary | ICD-10-CM | POA: Diagnosis not present

## 2017-05-28 DIAGNOSIS — E1129 Type 2 diabetes mellitus with other diabetic kidney complication: Secondary | ICD-10-CM | POA: Diagnosis not present

## 2017-05-28 DIAGNOSIS — D631 Anemia in chronic kidney disease: Secondary | ICD-10-CM | POA: Diagnosis not present

## 2017-05-28 DIAGNOSIS — R51 Headache: Secondary | ICD-10-CM | POA: Diagnosis not present

## 2017-05-28 DIAGNOSIS — D688 Other specified coagulation defects: Secondary | ICD-10-CM | POA: Diagnosis not present

## 2017-05-31 DIAGNOSIS — D631 Anemia in chronic kidney disease: Secondary | ICD-10-CM | POA: Diagnosis not present

## 2017-05-31 DIAGNOSIS — D688 Other specified coagulation defects: Secondary | ICD-10-CM | POA: Diagnosis not present

## 2017-05-31 DIAGNOSIS — D509 Iron deficiency anemia, unspecified: Secondary | ICD-10-CM | POA: Diagnosis not present

## 2017-05-31 DIAGNOSIS — N2581 Secondary hyperparathyroidism of renal origin: Secondary | ICD-10-CM | POA: Diagnosis not present

## 2017-05-31 DIAGNOSIS — R51 Headache: Secondary | ICD-10-CM | POA: Diagnosis not present

## 2017-05-31 DIAGNOSIS — T8249XA Other complication of vascular dialysis catheter, initial encounter: Secondary | ICD-10-CM | POA: Diagnosis not present

## 2017-05-31 DIAGNOSIS — N186 End stage renal disease: Secondary | ICD-10-CM | POA: Diagnosis not present

## 2017-05-31 DIAGNOSIS — M109 Gout, unspecified: Secondary | ICD-10-CM | POA: Diagnosis not present

## 2017-05-31 DIAGNOSIS — E1129 Type 2 diabetes mellitus with other diabetic kidney complication: Secondary | ICD-10-CM | POA: Diagnosis not present

## 2017-06-01 NOTE — Assessment & Plan Note (Addendum)
Exam without concern for septic joint at this time.  She is afebrile and otherwise well-appearing.  Would suspect acute gout of great L toe.  -Recommend Tylenol in setting of Stage V CKD, may use prn for pain  -Rx: prednisone steroid burst; 20 mg BID x5 days -Return precautions discussed

## 2017-06-02 DIAGNOSIS — M109 Gout, unspecified: Secondary | ICD-10-CM | POA: Diagnosis not present

## 2017-06-02 DIAGNOSIS — D688 Other specified coagulation defects: Secondary | ICD-10-CM | POA: Diagnosis not present

## 2017-06-02 DIAGNOSIS — N186 End stage renal disease: Secondary | ICD-10-CM | POA: Diagnosis not present

## 2017-06-02 DIAGNOSIS — D631 Anemia in chronic kidney disease: Secondary | ICD-10-CM | POA: Diagnosis not present

## 2017-06-02 DIAGNOSIS — R51 Headache: Secondary | ICD-10-CM | POA: Diagnosis not present

## 2017-06-02 DIAGNOSIS — E1129 Type 2 diabetes mellitus with other diabetic kidney complication: Secondary | ICD-10-CM | POA: Diagnosis not present

## 2017-06-02 DIAGNOSIS — T8249XA Other complication of vascular dialysis catheter, initial encounter: Secondary | ICD-10-CM | POA: Diagnosis not present

## 2017-06-02 DIAGNOSIS — D509 Iron deficiency anemia, unspecified: Secondary | ICD-10-CM | POA: Diagnosis not present

## 2017-06-02 DIAGNOSIS — N2581 Secondary hyperparathyroidism of renal origin: Secondary | ICD-10-CM | POA: Diagnosis not present

## 2017-06-04 DIAGNOSIS — N186 End stage renal disease: Secondary | ICD-10-CM | POA: Diagnosis not present

## 2017-06-04 DIAGNOSIS — R51 Headache: Secondary | ICD-10-CM | POA: Diagnosis not present

## 2017-06-04 DIAGNOSIS — D688 Other specified coagulation defects: Secondary | ICD-10-CM | POA: Diagnosis not present

## 2017-06-04 DIAGNOSIS — E1129 Type 2 diabetes mellitus with other diabetic kidney complication: Secondary | ICD-10-CM | POA: Diagnosis not present

## 2017-06-04 DIAGNOSIS — D631 Anemia in chronic kidney disease: Secondary | ICD-10-CM | POA: Diagnosis not present

## 2017-06-04 DIAGNOSIS — N2581 Secondary hyperparathyroidism of renal origin: Secondary | ICD-10-CM | POA: Diagnosis not present

## 2017-06-04 DIAGNOSIS — T8249XA Other complication of vascular dialysis catheter, initial encounter: Secondary | ICD-10-CM | POA: Diagnosis not present

## 2017-06-04 DIAGNOSIS — M109 Gout, unspecified: Secondary | ICD-10-CM | POA: Diagnosis not present

## 2017-06-04 DIAGNOSIS — D509 Iron deficiency anemia, unspecified: Secondary | ICD-10-CM | POA: Diagnosis not present

## 2017-06-07 DIAGNOSIS — M109 Gout, unspecified: Secondary | ICD-10-CM | POA: Diagnosis not present

## 2017-06-07 DIAGNOSIS — T8249XA Other complication of vascular dialysis catheter, initial encounter: Secondary | ICD-10-CM | POA: Diagnosis not present

## 2017-06-07 DIAGNOSIS — D631 Anemia in chronic kidney disease: Secondary | ICD-10-CM | POA: Diagnosis not present

## 2017-06-07 DIAGNOSIS — N2581 Secondary hyperparathyroidism of renal origin: Secondary | ICD-10-CM | POA: Diagnosis not present

## 2017-06-07 DIAGNOSIS — D509 Iron deficiency anemia, unspecified: Secondary | ICD-10-CM | POA: Diagnosis not present

## 2017-06-07 DIAGNOSIS — R51 Headache: Secondary | ICD-10-CM | POA: Diagnosis not present

## 2017-06-07 DIAGNOSIS — N186 End stage renal disease: Secondary | ICD-10-CM | POA: Diagnosis not present

## 2017-06-07 DIAGNOSIS — D688 Other specified coagulation defects: Secondary | ICD-10-CM | POA: Diagnosis not present

## 2017-06-07 DIAGNOSIS — E1129 Type 2 diabetes mellitus with other diabetic kidney complication: Secondary | ICD-10-CM | POA: Diagnosis not present

## 2017-06-09 DIAGNOSIS — D688 Other specified coagulation defects: Secondary | ICD-10-CM | POA: Diagnosis not present

## 2017-06-09 DIAGNOSIS — N2581 Secondary hyperparathyroidism of renal origin: Secondary | ICD-10-CM | POA: Diagnosis not present

## 2017-06-09 DIAGNOSIS — R51 Headache: Secondary | ICD-10-CM | POA: Diagnosis not present

## 2017-06-09 DIAGNOSIS — D631 Anemia in chronic kidney disease: Secondary | ICD-10-CM | POA: Diagnosis not present

## 2017-06-09 DIAGNOSIS — D509 Iron deficiency anemia, unspecified: Secondary | ICD-10-CM | POA: Diagnosis not present

## 2017-06-09 DIAGNOSIS — M109 Gout, unspecified: Secondary | ICD-10-CM | POA: Diagnosis not present

## 2017-06-09 DIAGNOSIS — E1129 Type 2 diabetes mellitus with other diabetic kidney complication: Secondary | ICD-10-CM | POA: Diagnosis not present

## 2017-06-09 DIAGNOSIS — N186 End stage renal disease: Secondary | ICD-10-CM | POA: Diagnosis not present

## 2017-06-09 DIAGNOSIS — T8249XA Other complication of vascular dialysis catheter, initial encounter: Secondary | ICD-10-CM | POA: Diagnosis not present

## 2017-06-11 DIAGNOSIS — N2581 Secondary hyperparathyroidism of renal origin: Secondary | ICD-10-CM | POA: Diagnosis not present

## 2017-06-11 DIAGNOSIS — N186 End stage renal disease: Secondary | ICD-10-CM | POA: Diagnosis not present

## 2017-06-11 DIAGNOSIS — E1129 Type 2 diabetes mellitus with other diabetic kidney complication: Secondary | ICD-10-CM | POA: Diagnosis not present

## 2017-06-11 DIAGNOSIS — D509 Iron deficiency anemia, unspecified: Secondary | ICD-10-CM | POA: Diagnosis not present

## 2017-06-11 DIAGNOSIS — D688 Other specified coagulation defects: Secondary | ICD-10-CM | POA: Diagnosis not present

## 2017-06-11 DIAGNOSIS — T8249XA Other complication of vascular dialysis catheter, initial encounter: Secondary | ICD-10-CM | POA: Diagnosis not present

## 2017-06-11 DIAGNOSIS — M109 Gout, unspecified: Secondary | ICD-10-CM | POA: Diagnosis not present

## 2017-06-11 DIAGNOSIS — D631 Anemia in chronic kidney disease: Secondary | ICD-10-CM | POA: Diagnosis not present

## 2017-06-11 DIAGNOSIS — R51 Headache: Secondary | ICD-10-CM | POA: Diagnosis not present

## 2017-06-14 DIAGNOSIS — N2581 Secondary hyperparathyroidism of renal origin: Secondary | ICD-10-CM | POA: Diagnosis not present

## 2017-06-14 DIAGNOSIS — D688 Other specified coagulation defects: Secondary | ICD-10-CM | POA: Diagnosis not present

## 2017-06-14 DIAGNOSIS — R51 Headache: Secondary | ICD-10-CM | POA: Diagnosis not present

## 2017-06-14 DIAGNOSIS — D631 Anemia in chronic kidney disease: Secondary | ICD-10-CM | POA: Diagnosis not present

## 2017-06-14 DIAGNOSIS — M109 Gout, unspecified: Secondary | ICD-10-CM | POA: Diagnosis not present

## 2017-06-14 DIAGNOSIS — N186 End stage renal disease: Secondary | ICD-10-CM | POA: Diagnosis not present

## 2017-06-14 DIAGNOSIS — D509 Iron deficiency anemia, unspecified: Secondary | ICD-10-CM | POA: Diagnosis not present

## 2017-06-14 DIAGNOSIS — E1129 Type 2 diabetes mellitus with other diabetic kidney complication: Secondary | ICD-10-CM | POA: Diagnosis not present

## 2017-06-14 DIAGNOSIS — T8249XA Other complication of vascular dialysis catheter, initial encounter: Secondary | ICD-10-CM | POA: Diagnosis not present

## 2017-06-16 DIAGNOSIS — N186 End stage renal disease: Secondary | ICD-10-CM | POA: Diagnosis not present

## 2017-06-16 DIAGNOSIS — D631 Anemia in chronic kidney disease: Secondary | ICD-10-CM | POA: Diagnosis not present

## 2017-06-16 DIAGNOSIS — D688 Other specified coagulation defects: Secondary | ICD-10-CM | POA: Diagnosis not present

## 2017-06-16 DIAGNOSIS — T8249XA Other complication of vascular dialysis catheter, initial encounter: Secondary | ICD-10-CM | POA: Diagnosis not present

## 2017-06-16 DIAGNOSIS — D509 Iron deficiency anemia, unspecified: Secondary | ICD-10-CM | POA: Diagnosis not present

## 2017-06-16 DIAGNOSIS — R51 Headache: Secondary | ICD-10-CM | POA: Diagnosis not present

## 2017-06-16 DIAGNOSIS — M109 Gout, unspecified: Secondary | ICD-10-CM | POA: Diagnosis not present

## 2017-06-16 DIAGNOSIS — N2581 Secondary hyperparathyroidism of renal origin: Secondary | ICD-10-CM | POA: Diagnosis not present

## 2017-06-16 DIAGNOSIS — E1129 Type 2 diabetes mellitus with other diabetic kidney complication: Secondary | ICD-10-CM | POA: Diagnosis not present

## 2017-06-17 DIAGNOSIS — N186 End stage renal disease: Secondary | ICD-10-CM | POA: Diagnosis not present

## 2017-06-17 DIAGNOSIS — E1129 Type 2 diabetes mellitus with other diabetic kidney complication: Secondary | ICD-10-CM | POA: Diagnosis not present

## 2017-06-17 DIAGNOSIS — Z992 Dependence on renal dialysis: Secondary | ICD-10-CM | POA: Diagnosis not present

## 2017-06-18 DIAGNOSIS — R51 Headache: Secondary | ICD-10-CM | POA: Diagnosis not present

## 2017-06-18 DIAGNOSIS — D509 Iron deficiency anemia, unspecified: Secondary | ICD-10-CM | POA: Diagnosis not present

## 2017-06-18 DIAGNOSIS — D631 Anemia in chronic kidney disease: Secondary | ICD-10-CM | POA: Diagnosis not present

## 2017-06-18 DIAGNOSIS — N2581 Secondary hyperparathyroidism of renal origin: Secondary | ICD-10-CM | POA: Diagnosis not present

## 2017-06-18 DIAGNOSIS — D688 Other specified coagulation defects: Secondary | ICD-10-CM | POA: Diagnosis not present

## 2017-06-18 DIAGNOSIS — E1129 Type 2 diabetes mellitus with other diabetic kidney complication: Secondary | ICD-10-CM | POA: Diagnosis not present

## 2017-06-18 DIAGNOSIS — T8249XA Other complication of vascular dialysis catheter, initial encounter: Secondary | ICD-10-CM | POA: Diagnosis not present

## 2017-06-18 DIAGNOSIS — N186 End stage renal disease: Secondary | ICD-10-CM | POA: Diagnosis not present

## 2017-06-21 DIAGNOSIS — D688 Other specified coagulation defects: Secondary | ICD-10-CM | POA: Diagnosis not present

## 2017-06-21 DIAGNOSIS — N186 End stage renal disease: Secondary | ICD-10-CM | POA: Diagnosis not present

## 2017-06-21 DIAGNOSIS — N2581 Secondary hyperparathyroidism of renal origin: Secondary | ICD-10-CM | POA: Diagnosis not present

## 2017-06-21 DIAGNOSIS — D631 Anemia in chronic kidney disease: Secondary | ICD-10-CM | POA: Diagnosis not present

## 2017-06-21 DIAGNOSIS — T8249XA Other complication of vascular dialysis catheter, initial encounter: Secondary | ICD-10-CM | POA: Diagnosis not present

## 2017-06-21 DIAGNOSIS — D509 Iron deficiency anemia, unspecified: Secondary | ICD-10-CM | POA: Diagnosis not present

## 2017-06-21 DIAGNOSIS — R51 Headache: Secondary | ICD-10-CM | POA: Diagnosis not present

## 2017-06-21 DIAGNOSIS — E1129 Type 2 diabetes mellitus with other diabetic kidney complication: Secondary | ICD-10-CM | POA: Diagnosis not present

## 2017-06-23 DIAGNOSIS — D631 Anemia in chronic kidney disease: Secondary | ICD-10-CM | POA: Diagnosis not present

## 2017-06-23 DIAGNOSIS — D509 Iron deficiency anemia, unspecified: Secondary | ICD-10-CM | POA: Diagnosis not present

## 2017-06-23 DIAGNOSIS — T8249XA Other complication of vascular dialysis catheter, initial encounter: Secondary | ICD-10-CM | POA: Diagnosis not present

## 2017-06-23 DIAGNOSIS — N186 End stage renal disease: Secondary | ICD-10-CM | POA: Diagnosis not present

## 2017-06-23 DIAGNOSIS — N2581 Secondary hyperparathyroidism of renal origin: Secondary | ICD-10-CM | POA: Diagnosis not present

## 2017-06-23 DIAGNOSIS — D688 Other specified coagulation defects: Secondary | ICD-10-CM | POA: Diagnosis not present

## 2017-06-23 DIAGNOSIS — R51 Headache: Secondary | ICD-10-CM | POA: Diagnosis not present

## 2017-06-23 DIAGNOSIS — E1129 Type 2 diabetes mellitus with other diabetic kidney complication: Secondary | ICD-10-CM | POA: Diagnosis not present

## 2017-06-25 DIAGNOSIS — E1129 Type 2 diabetes mellitus with other diabetic kidney complication: Secondary | ICD-10-CM | POA: Diagnosis not present

## 2017-06-25 DIAGNOSIS — N2581 Secondary hyperparathyroidism of renal origin: Secondary | ICD-10-CM | POA: Diagnosis not present

## 2017-06-25 DIAGNOSIS — R51 Headache: Secondary | ICD-10-CM | POA: Diagnosis not present

## 2017-06-25 DIAGNOSIS — N186 End stage renal disease: Secondary | ICD-10-CM | POA: Diagnosis not present

## 2017-06-25 DIAGNOSIS — D509 Iron deficiency anemia, unspecified: Secondary | ICD-10-CM | POA: Diagnosis not present

## 2017-06-25 DIAGNOSIS — D631 Anemia in chronic kidney disease: Secondary | ICD-10-CM | POA: Diagnosis not present

## 2017-06-25 DIAGNOSIS — T8249XA Other complication of vascular dialysis catheter, initial encounter: Secondary | ICD-10-CM | POA: Diagnosis not present

## 2017-06-25 DIAGNOSIS — D688 Other specified coagulation defects: Secondary | ICD-10-CM | POA: Diagnosis not present

## 2017-06-28 DIAGNOSIS — E1129 Type 2 diabetes mellitus with other diabetic kidney complication: Secondary | ICD-10-CM | POA: Diagnosis not present

## 2017-06-28 DIAGNOSIS — R51 Headache: Secondary | ICD-10-CM | POA: Diagnosis not present

## 2017-06-28 DIAGNOSIS — N186 End stage renal disease: Secondary | ICD-10-CM | POA: Diagnosis not present

## 2017-06-28 DIAGNOSIS — D688 Other specified coagulation defects: Secondary | ICD-10-CM | POA: Diagnosis not present

## 2017-06-28 DIAGNOSIS — N2581 Secondary hyperparathyroidism of renal origin: Secondary | ICD-10-CM | POA: Diagnosis not present

## 2017-06-28 DIAGNOSIS — D631 Anemia in chronic kidney disease: Secondary | ICD-10-CM | POA: Diagnosis not present

## 2017-06-28 DIAGNOSIS — D509 Iron deficiency anemia, unspecified: Secondary | ICD-10-CM | POA: Diagnosis not present

## 2017-06-28 DIAGNOSIS — T8249XA Other complication of vascular dialysis catheter, initial encounter: Secondary | ICD-10-CM | POA: Diagnosis not present

## 2017-06-30 DIAGNOSIS — D688 Other specified coagulation defects: Secondary | ICD-10-CM | POA: Diagnosis not present

## 2017-06-30 DIAGNOSIS — N2581 Secondary hyperparathyroidism of renal origin: Secondary | ICD-10-CM | POA: Diagnosis not present

## 2017-06-30 DIAGNOSIS — R51 Headache: Secondary | ICD-10-CM | POA: Diagnosis not present

## 2017-06-30 DIAGNOSIS — D509 Iron deficiency anemia, unspecified: Secondary | ICD-10-CM | POA: Diagnosis not present

## 2017-06-30 DIAGNOSIS — D631 Anemia in chronic kidney disease: Secondary | ICD-10-CM | POA: Diagnosis not present

## 2017-06-30 DIAGNOSIS — N186 End stage renal disease: Secondary | ICD-10-CM | POA: Diagnosis not present

## 2017-06-30 DIAGNOSIS — E1129 Type 2 diabetes mellitus with other diabetic kidney complication: Secondary | ICD-10-CM | POA: Diagnosis not present

## 2017-06-30 DIAGNOSIS — T8249XA Other complication of vascular dialysis catheter, initial encounter: Secondary | ICD-10-CM | POA: Diagnosis not present

## 2017-07-02 DIAGNOSIS — R51 Headache: Secondary | ICD-10-CM | POA: Diagnosis not present

## 2017-07-02 DIAGNOSIS — E1129 Type 2 diabetes mellitus with other diabetic kidney complication: Secondary | ICD-10-CM | POA: Diagnosis not present

## 2017-07-02 DIAGNOSIS — D631 Anemia in chronic kidney disease: Secondary | ICD-10-CM | POA: Diagnosis not present

## 2017-07-02 DIAGNOSIS — N186 End stage renal disease: Secondary | ICD-10-CM | POA: Diagnosis not present

## 2017-07-02 DIAGNOSIS — T8249XA Other complication of vascular dialysis catheter, initial encounter: Secondary | ICD-10-CM | POA: Diagnosis not present

## 2017-07-02 DIAGNOSIS — N2581 Secondary hyperparathyroidism of renal origin: Secondary | ICD-10-CM | POA: Diagnosis not present

## 2017-07-02 DIAGNOSIS — D688 Other specified coagulation defects: Secondary | ICD-10-CM | POA: Diagnosis not present

## 2017-07-02 DIAGNOSIS — D509 Iron deficiency anemia, unspecified: Secondary | ICD-10-CM | POA: Diagnosis not present

## 2017-07-04 ENCOUNTER — Encounter: Payer: Self-pay | Admitting: Family Medicine

## 2017-07-04 ENCOUNTER — Other Ambulatory Visit: Payer: Self-pay

## 2017-07-04 ENCOUNTER — Ambulatory Visit (INDEPENDENT_AMBULATORY_CARE_PROVIDER_SITE_OTHER): Payer: Medicare Other | Admitting: Family Medicine

## 2017-07-04 VITALS — BP 102/58 | HR 95 | Temp 98.1°F | Wt 228.0 lb

## 2017-07-04 DIAGNOSIS — R21 Rash and other nonspecific skin eruption: Secondary | ICD-10-CM

## 2017-07-04 MED ORDER — TRIAMCINOLONE ACETONIDE 0.5 % EX OINT: 1.0000 "application " | TOPICAL_OINTMENT | Freq: Two times a day (BID) | CUTANEOUS | 0 refills | Status: DC

## 2017-07-04 MED ORDER — CETIRIZINE HCL 5 MG PO TABS: 5.0000 mg | ORAL_TABLET | Freq: Every day | ORAL | 0 refills | Status: DC

## 2017-07-04 NOTE — Patient Instructions (Signed)
It was great seeing you today! We have addressed the following issues today  1. I want you to use the ointment two times a day. When you itch, use Vaseline and stay moisturize.   2. Use zyrtec for the itching during the day one pill and benadryl at night  3. I will check your liver function.  If we did any lab work today, and the results require attention, either me or my nurse will get in touch with you. If everything is normal, you will get a letter in mail and a message via . If you don't hear from [place] in two weeks, please give [place] a call. Otherwise, we look forward to seeing you again at your next visit. If you have any questions or concerns before then, please call the clinic at [phone].  Please bring all your medications to every doctors visit  Sign up for My Chart to have easy access to your labs results, and communication with your Primary care physician. Please ask Front Desk for some assistance.   Please check-out at the front desk before leaving the clinic.    Take Care,   Dr. Andy Gauss

## 2017-07-04 NOTE — Progress Notes (Signed)
Subjective:    Patient ID: Rachel Hobbs, female    DOB: March 07, 1941, 77 y.o.   MRN: [id number]   CC: Diffuse pruritic rash  HPI: Patient is 77 year old female with a past medical history significant for hypertension, diastolic CHF, type 2 diabetes, ESRD, hyperlipidemia who presents today complaining of diffuse pruritic rash for the past few months.  Patient was recently seen in clinic January and was prescribed Atarax, Eucerin, and clobetasol for a week.  Patient reports that above regimen did not symptoms better.  She has been taking Benadryl for the itching which some improvement.  Patient also has a diagnosis of lichen planus in October and has received Kenalog in the past with no change in symptoms.  Patient reports significant irritation due to pruritus has been unable to sleep.  Patient was recently treated for gout and received prednisone for 5 days.  Patient denies any improvement in her symptoms during oral steroid treatment.  Smoking status reviewed   ROS: all other systems were reviewed and are negative other than in the HPI   Past Medical History:  Diagnosis Date  . Arthritis   . CHF (congestive heart failure) ([place])   . Depression   . Diabetes mellitus    type 2  . Hernia   . Hyperlipidemia   . Hypertension   . Iron deficiency anemia   . Low iron   . Peripheral vascular disease (HCC)    in legs  . Pneumonia    teenager  . Renal disorder    CKD - dialysis T/TH/Sa  . Shortness of breath dyspnea    with exertion    Past Surgical History:  Procedure Laterality Date  . ABDOMINAL HYSTERECTOMY     in the 70's  . AV FISTULA PLACEMENT Left 12/05/2015   Procedure: RADIOCEPHALIC VERSUS BRACHIOCEPHALIC ARTERIOVENOUS (AV) FISTULA CREATION;  Surgeon: [name], MD;  Location: [facility];  Service: Vascular;  Laterality: Left;  . AV FISTULA PLACEMENT Left 05/20/2017   Procedure: INSERTION OF ARTERIOVENOUS (AV) GORE-TEX VASCULAR STRETCH 4-7 GRAFT ARM LEFT UPPER ARM;  Surgeon:  [name], MD;  Location: [facility];  Service: Vascular;  Laterality: Left;  . BASCILIC VEIN TRANSPOSITION Left 06/04/2016   Procedure: FIRST STAGE BASILIC VEIN TRANSPOSITION;  Surgeon: [name], MD;  Location: [address];  Service: Vascular;  Laterality: Left;  . BASCILIC VEIN TRANSPOSITION Left 08/04/2016   Procedure: LEFT UPPER ARM [place], SECOND STAGE;  Surgeon: [name], MD;  Location: [facility];  Service: Vascular;  Laterality: Left;  . HERNIA REPAIR     umbilical in the 50'N  . INSERTION OF DIALYSIS CATHETER Right 12/05/2015   Procedure: INSERTION OF DIALYSIS CATHETER;  Surgeon: [name], MD;  Location: [facility] OR;  Service: Vascular;  Laterality: Right;    Past medical history, surgical, family, and social history reviewed and updated in the EMR as appropriate.  Objective:  BP (!) 102/58   Pulse 95   Temp 98.1 F (36.7 C) (Oral)   Wt 228 lb (103.4 kg)   SpO2 99%   BMI 41.70 kg/m   Vitals and nursing note reviewed  General: NAD, pleasant, able to participate in exam Cardiac: RRR, normal heart sounds, no murmurs. 2+ radial and PT pulses bilaterally Respiratory: CTAB, normal effort, No wheezes, rales or rhonchi Abdomen: soft, nontender, nondistended, no hepatic or splenomegaly, +BS Extremities: no edema or cyanosis. WWP. Skin: Diffuse plaque on abdomen hyperpigmented pruritic in nature.  Multiple lesions noted  on her torso, back, upper and lower extremity.  Hyperpigmented, no bleeding no drainage, no warmth no erythema.  Excoriations noted around lesions. Neuro: alert and oriented x4, no focal deficits Psych: Normal affect and mood   Assessment & Plan:   #Pruritic rash, diffuse, worsening Patient presents for extensive rash on abdomen, chest, back, upper and lower extremity.  On exam lesion on the consistent with prior diagnosis of lichen planus.  Prior treatment with Kenalog, clobetasol, Eucerin and Atarax have been unsuccessful.  There is sparing  of mid back, bottom half of lower extremity and medial aspect of arms.  These are areas of  is unable to scratch.  Lesions seems to be secondary to excoriation from chronic itching.  Patient is an ESRD patient on dialysis.  Literature shows that pruritus is pretty common in ESRD patient on dialysis.  No signs of liver dysfunction however cholestasis could also present in the same manner.  Unlikely to be lichen planus given diffuse rash/pruritus. --Order CMP to assess liver function and possible cholestasis --Prescribe triamcinolone 0.5% ointment to be applied to all affected areas --Vaseline in all affected areas --Zyrtec for pruritus during the day continue with Benadryl at night --If symptoms do not improve patient will return for skin biopsy and further evaluation --Discussed possibility of gabapentin based on literature for pruritus associated with dialysis.    Marjie Skiff, MD [place] PGY-2

## 2017-07-05 DIAGNOSIS — E1129 Type 2 diabetes mellitus with other diabetic kidney complication: Secondary | ICD-10-CM | POA: Diagnosis not present

## 2017-07-05 DIAGNOSIS — R51 Headache: Secondary | ICD-10-CM | POA: Diagnosis not present

## 2017-07-05 DIAGNOSIS — D631 Anemia in chronic kidney disease: Secondary | ICD-10-CM | POA: Diagnosis not present

## 2017-07-05 DIAGNOSIS — N186 End stage renal disease: Secondary | ICD-10-CM | POA: Diagnosis not present

## 2017-07-05 DIAGNOSIS — T8249XA Other complication of vascular dialysis catheter, initial encounter: Secondary | ICD-10-CM | POA: Diagnosis not present

## 2017-07-05 DIAGNOSIS — D509 Iron deficiency anemia, unspecified: Secondary | ICD-10-CM | POA: Diagnosis not present

## 2017-07-05 DIAGNOSIS — D688 Other specified coagulation defects: Secondary | ICD-10-CM | POA: Diagnosis not present

## 2017-07-05 DIAGNOSIS — N2581 Secondary hyperparathyroidism of renal origin: Secondary | ICD-10-CM | POA: Diagnosis not present

## 2017-07-05 LAB — CMP14+EGFR
A/G RATIO: 1.6 (ref 1.2–2.2)
ALT: 18 IU/L (ref 0–32)
AST: 22 IU/L (ref 0–40)
Albumin: 4.1 g/dL (ref 3.5–4.8)
Alkaline Phosphatase: 137 IU/L — ABNORMAL HIGH (ref 39–117)
BILIRUBIN TOTAL: 0.3 mg/dL (ref 0.0–1.2)
BUN/Creatinine Ratio: 5 — ABNORMAL LOW (ref 12–28)
BUN: 41 mg/dL — ABNORMAL HIGH (ref 8–27)
CHLORIDE: 96 mmol/L (ref 96–106)
CO2: 20 mmol/L (ref 20–29)
Calcium: 8.4 mg/dL — ABNORMAL LOW (ref 8.7–10.3)
Creatinine, Ser: 8.46 mg/dL — ABNORMAL HIGH (ref 0.57–1.00)
GFR calc non Af Amer: 4 mL/min/{1.73_m2} — ABNORMAL LOW (ref >59)
GFR, EST AFRICAN AMERICAN: 5 mL/min/{1.73_m2} — AB (ref >59)
GLOBULIN, TOTAL: 2.6 g/dL (ref 1.5–4.5)
Glucose: 212 mg/dL — ABNORMAL HIGH (ref 65–99)
POTASSIUM: 4.4 mmol/L (ref 3.5–5.2)
SODIUM: 137 mmol/L (ref 134–144)
TOTAL PROTEIN: 6.7 g/dL (ref 6.0–8.5)

## 2017-07-07 DIAGNOSIS — D688 Other specified coagulation defects: Secondary | ICD-10-CM | POA: Diagnosis not present

## 2017-07-07 DIAGNOSIS — R51 Headache: Secondary | ICD-10-CM | POA: Diagnosis not present

## 2017-07-07 DIAGNOSIS — N186 End stage renal disease: Secondary | ICD-10-CM | POA: Diagnosis not present

## 2017-07-07 DIAGNOSIS — T8249XA Other complication of vascular dialysis catheter, initial encounter: Secondary | ICD-10-CM | POA: Diagnosis not present

## 2017-07-07 DIAGNOSIS — N2581 Secondary hyperparathyroidism of renal origin: Secondary | ICD-10-CM | POA: Diagnosis not present

## 2017-07-07 DIAGNOSIS — D509 Iron deficiency anemia, unspecified: Secondary | ICD-10-CM | POA: Diagnosis not present

## 2017-07-07 DIAGNOSIS — E1129 Type 2 diabetes mellitus with other diabetic kidney complication: Secondary | ICD-10-CM | POA: Diagnosis not present

## 2017-07-07 DIAGNOSIS — D631 Anemia in chronic kidney disease: Secondary | ICD-10-CM | POA: Diagnosis not present

## 2017-07-09 DIAGNOSIS — T8249XA Other complication of vascular dialysis catheter, initial encounter: Secondary | ICD-10-CM | POA: Diagnosis not present

## 2017-07-09 DIAGNOSIS — R51 Headache: Secondary | ICD-10-CM | POA: Diagnosis not present

## 2017-07-09 DIAGNOSIS — D631 Anemia in chronic kidney disease: Secondary | ICD-10-CM | POA: Diagnosis not present

## 2017-07-09 DIAGNOSIS — D509 Iron deficiency anemia, unspecified: Secondary | ICD-10-CM | POA: Diagnosis not present

## 2017-07-09 DIAGNOSIS — D688 Other specified coagulation defects: Secondary | ICD-10-CM | POA: Diagnosis not present

## 2017-07-09 DIAGNOSIS — N186 End stage renal disease: Secondary | ICD-10-CM | POA: Diagnosis not present

## 2017-07-09 DIAGNOSIS — E1129 Type 2 diabetes mellitus with other diabetic kidney complication: Secondary | ICD-10-CM | POA: Diagnosis not present

## 2017-07-09 DIAGNOSIS — N2581 Secondary hyperparathyroidism of renal origin: Secondary | ICD-10-CM | POA: Diagnosis not present

## 2017-07-12 DIAGNOSIS — T8249XA Other complication of vascular dialysis catheter, initial encounter: Secondary | ICD-10-CM | POA: Diagnosis not present

## 2017-07-12 DIAGNOSIS — D509 Iron deficiency anemia, unspecified: Secondary | ICD-10-CM | POA: Diagnosis not present

## 2017-07-12 DIAGNOSIS — E1129 Type 2 diabetes mellitus with other diabetic kidney complication: Secondary | ICD-10-CM | POA: Diagnosis not present

## 2017-07-12 DIAGNOSIS — D688 Other specified coagulation defects: Secondary | ICD-10-CM | POA: Diagnosis not present

## 2017-07-12 DIAGNOSIS — N186 End stage renal disease: Secondary | ICD-10-CM | POA: Diagnosis not present

## 2017-07-12 DIAGNOSIS — N2581 Secondary hyperparathyroidism of renal origin: Secondary | ICD-10-CM | POA: Diagnosis not present

## 2017-07-12 DIAGNOSIS — D631 Anemia in chronic kidney disease: Secondary | ICD-10-CM | POA: Diagnosis not present

## 2017-07-12 DIAGNOSIS — R51 Headache: Secondary | ICD-10-CM | POA: Diagnosis not present

## 2017-07-14 DIAGNOSIS — T8249XA Other complication of vascular dialysis catheter, initial encounter: Secondary | ICD-10-CM | POA: Diagnosis not present

## 2017-07-14 DIAGNOSIS — D631 Anemia in chronic kidney disease: Secondary | ICD-10-CM | POA: Diagnosis not present

## 2017-07-14 DIAGNOSIS — R51 Headache: Secondary | ICD-10-CM | POA: Diagnosis not present

## 2017-07-14 DIAGNOSIS — D509 Iron deficiency anemia, unspecified: Secondary | ICD-10-CM | POA: Diagnosis not present

## 2017-07-14 DIAGNOSIS — N2581 Secondary hyperparathyroidism of renal origin: Secondary | ICD-10-CM | POA: Diagnosis not present

## 2017-07-14 DIAGNOSIS — D688 Other specified coagulation defects: Secondary | ICD-10-CM | POA: Diagnosis not present

## 2017-07-14 DIAGNOSIS — N186 End stage renal disease: Secondary | ICD-10-CM | POA: Diagnosis not present

## 2017-07-14 DIAGNOSIS — E1129 Type 2 diabetes mellitus with other diabetic kidney complication: Secondary | ICD-10-CM | POA: Diagnosis not present

## 2017-07-15 ENCOUNTER — Encounter: Payer: Self-pay | Admitting: Podiatry

## 2017-07-15 ENCOUNTER — Ambulatory Visit: Payer: Medicare Other | Admitting: Podiatry

## 2017-07-15 ENCOUNTER — Telehealth: Payer: Self-pay

## 2017-07-15 DIAGNOSIS — M79609 Pain in unspecified limb: Secondary | ICD-10-CM | POA: Diagnosis not present

## 2017-07-15 DIAGNOSIS — Z794 Long term (current) use of insulin: Secondary | ICD-10-CM

## 2017-07-15 DIAGNOSIS — B351 Tinea unguium: Secondary | ICD-10-CM

## 2017-07-15 DIAGNOSIS — E1151 Type 2 diabetes mellitus with diabetic peripheral angiopathy without gangrene: Secondary | ICD-10-CM

## 2017-07-15 NOTE — Progress Notes (Signed)
Patient ID: Rachel Hobbs, female   DOB: [birth year], 77 y.o.   MRN: [id number] HPI  Complaint:  Visit Type: Patient returns to my office for continued preventative foot care services. Complaint: Patient states" my nails have grown long and thick and become painful to walk and wear shoes" Patient has been diagnosed with DM . This patient  presents for preventative foot care services. No changes to ROS  Podiatric Exam: Vascular: dorsalis pedis and posterior tibial pulses are non palpable due to swelling both feet.. Capillary return is slow to refill. Temperature gradient is negative. Skin turgor WNL, bilateral swelling  Sensorium: Diminished  Semmes Weinstein monofilament test. Normal tactile sensation bilaterally.  Nail Exam: Pt has thick disfigured discolored nails with subungual debris noted bilateral entire nail hallux through fifth toenails Ulcer Exam: There is no evidence of ulcer or pre-ulcerative changes or infection. Orthopedic Exam: Muscle tone and strength are WNL. No limitations in general ROM. No crepitus or effusions noted. Foot type and digits show no abnormalities.  HAV  B/L Skin: No Porokeratosis. No infection or ulcers.  Blackened circular lesions 1st MPJ left and heel medial aspect left.  Diagnosis:  Onychomycosis, Pain in right toe, pain in left toes  Treatment & Plan Procedures and Treatment: Consent by patient was obtained for treatment procedures. The patient understood the discussion of treatment and procedures well. All questions were answered thoroughly reviewed. Debridement of mycotic and hypertrophic toenails, 1 through 5 bilateral and clearing of subungual debris. No ulceration, no infection noted.  Return Visit-Office Procedure: Patient instructed to return to the office for a follow up visit 10 weeks  for continued evaluation and treatment.   Gardiner Barefoot DPM

## 2017-07-15 NOTE — Telephone Encounter (Signed)
Patient left message stating she has not heard about lab results from 07/04/17. Danley Danker, RN [facility] RN)

## 2017-07-16 DIAGNOSIS — D631 Anemia in chronic kidney disease: Secondary | ICD-10-CM | POA: Diagnosis not present

## 2017-07-16 DIAGNOSIS — R51 Headache: Secondary | ICD-10-CM | POA: Diagnosis not present

## 2017-07-16 DIAGNOSIS — N2581 Secondary hyperparathyroidism of renal origin: Secondary | ICD-10-CM | POA: Diagnosis not present

## 2017-07-16 DIAGNOSIS — N186 End stage renal disease: Secondary | ICD-10-CM | POA: Diagnosis not present

## 2017-07-16 DIAGNOSIS — T8249XA Other complication of vascular dialysis catheter, initial encounter: Secondary | ICD-10-CM | POA: Diagnosis not present

## 2017-07-16 DIAGNOSIS — E1129 Type 2 diabetes mellitus with other diabetic kidney complication: Secondary | ICD-10-CM | POA: Diagnosis not present

## 2017-07-16 DIAGNOSIS — D509 Iron deficiency anemia, unspecified: Secondary | ICD-10-CM | POA: Diagnosis not present

## 2017-07-16 DIAGNOSIS — D688 Other specified coagulation defects: Secondary | ICD-10-CM | POA: Diagnosis not present

## 2017-07-18 DIAGNOSIS — Z992 Dependence on renal dialysis: Secondary | ICD-10-CM | POA: Diagnosis not present

## 2017-07-18 DIAGNOSIS — E1129 Type 2 diabetes mellitus with other diabetic kidney complication: Secondary | ICD-10-CM | POA: Diagnosis not present

## 2017-07-18 DIAGNOSIS — N186 End stage renal disease: Secondary | ICD-10-CM | POA: Diagnosis not present

## 2017-07-18 DIAGNOSIS — Z452 Encounter for adjustment and management of vascular access device: Secondary | ICD-10-CM | POA: Diagnosis not present

## 2017-07-18 NOTE — Telephone Encounter (Signed)
Patient informed of results.  States that she is still having itchy but has started the zyrtec for the past 2 days.  Per note, patient should return to clinic for a skin biopsy if there isn't any improvement.  Pt voiced understanding and will call back at the end of the week if she needs to do this.  Jazmin Hartsell,CMA

## 2017-07-18 NOTE — Telephone Encounter (Signed)
Jazmin,  Please let patient know that her lab results were normal and are not concerning.   Thank you  Marjie Skiff, MD [place], PGY-2

## 2017-07-19 DIAGNOSIS — D688 Other specified coagulation defects: Secondary | ICD-10-CM | POA: Diagnosis not present

## 2017-07-19 DIAGNOSIS — E1129 Type 2 diabetes mellitus with other diabetic kidney complication: Secondary | ICD-10-CM | POA: Diagnosis not present

## 2017-07-19 DIAGNOSIS — M109 Gout, unspecified: Secondary | ICD-10-CM | POA: Diagnosis not present

## 2017-07-19 DIAGNOSIS — N2581 Secondary hyperparathyroidism of renal origin: Secondary | ICD-10-CM | POA: Diagnosis not present

## 2017-07-19 DIAGNOSIS — R51 Headache: Secondary | ICD-10-CM | POA: Diagnosis not present

## 2017-07-19 DIAGNOSIS — D631 Anemia in chronic kidney disease: Secondary | ICD-10-CM | POA: Diagnosis not present

## 2017-07-19 DIAGNOSIS — N186 End stage renal disease: Secondary | ICD-10-CM | POA: Diagnosis not present

## 2017-07-19 DIAGNOSIS — D509 Iron deficiency anemia, unspecified: Secondary | ICD-10-CM | POA: Diagnosis not present

## 2017-07-21 DIAGNOSIS — R51 Headache: Secondary | ICD-10-CM | POA: Diagnosis not present

## 2017-07-21 DIAGNOSIS — D688 Other specified coagulation defects: Secondary | ICD-10-CM | POA: Diagnosis not present

## 2017-07-21 DIAGNOSIS — D631 Anemia in chronic kidney disease: Secondary | ICD-10-CM | POA: Diagnosis not present

## 2017-07-21 DIAGNOSIS — M109 Gout, unspecified: Secondary | ICD-10-CM | POA: Diagnosis not present

## 2017-07-21 DIAGNOSIS — D509 Iron deficiency anemia, unspecified: Secondary | ICD-10-CM | POA: Diagnosis not present

## 2017-07-21 DIAGNOSIS — E1129 Type 2 diabetes mellitus with other diabetic kidney complication: Secondary | ICD-10-CM | POA: Diagnosis not present

## 2017-07-21 DIAGNOSIS — N2581 Secondary hyperparathyroidism of renal origin: Secondary | ICD-10-CM | POA: Diagnosis not present

## 2017-07-21 DIAGNOSIS — N186 End stage renal disease: Secondary | ICD-10-CM | POA: Diagnosis not present

## 2017-07-23 DIAGNOSIS — D509 Iron deficiency anemia, unspecified: Secondary | ICD-10-CM | POA: Diagnosis not present

## 2017-07-23 DIAGNOSIS — N186 End stage renal disease: Secondary | ICD-10-CM | POA: Diagnosis not present

## 2017-07-23 DIAGNOSIS — N2581 Secondary hyperparathyroidism of renal origin: Secondary | ICD-10-CM | POA: Diagnosis not present

## 2017-07-23 DIAGNOSIS — D688 Other specified coagulation defects: Secondary | ICD-10-CM | POA: Diagnosis not present

## 2017-07-23 DIAGNOSIS — E1129 Type 2 diabetes mellitus with other diabetic kidney complication: Secondary | ICD-10-CM | POA: Diagnosis not present

## 2017-07-23 DIAGNOSIS — M109 Gout, unspecified: Secondary | ICD-10-CM | POA: Diagnosis not present

## 2017-07-23 DIAGNOSIS — R51 Headache: Secondary | ICD-10-CM | POA: Diagnosis not present

## 2017-07-23 DIAGNOSIS — D631 Anemia in chronic kidney disease: Secondary | ICD-10-CM | POA: Diagnosis not present

## 2017-07-26 DIAGNOSIS — E1129 Type 2 diabetes mellitus with other diabetic kidney complication: Secondary | ICD-10-CM | POA: Diagnosis not present

## 2017-07-26 DIAGNOSIS — N186 End stage renal disease: Secondary | ICD-10-CM | POA: Diagnosis not present

## 2017-07-26 DIAGNOSIS — D631 Anemia in chronic kidney disease: Secondary | ICD-10-CM | POA: Diagnosis not present

## 2017-07-26 DIAGNOSIS — D509 Iron deficiency anemia, unspecified: Secondary | ICD-10-CM | POA: Diagnosis not present

## 2017-07-26 DIAGNOSIS — N2581 Secondary hyperparathyroidism of renal origin: Secondary | ICD-10-CM | POA: Diagnosis not present

## 2017-07-26 DIAGNOSIS — R51 Headache: Secondary | ICD-10-CM | POA: Diagnosis not present

## 2017-07-26 DIAGNOSIS — D688 Other specified coagulation defects: Secondary | ICD-10-CM | POA: Diagnosis not present

## 2017-07-26 DIAGNOSIS — M109 Gout, unspecified: Secondary | ICD-10-CM | POA: Diagnosis not present

## 2017-07-28 DIAGNOSIS — D509 Iron deficiency anemia, unspecified: Secondary | ICD-10-CM | POA: Diagnosis not present

## 2017-07-28 DIAGNOSIS — E1129 Type 2 diabetes mellitus with other diabetic kidney complication: Secondary | ICD-10-CM | POA: Diagnosis not present

## 2017-07-28 DIAGNOSIS — D631 Anemia in chronic kidney disease: Secondary | ICD-10-CM | POA: Diagnosis not present

## 2017-07-28 DIAGNOSIS — N2581 Secondary hyperparathyroidism of renal origin: Secondary | ICD-10-CM | POA: Diagnosis not present

## 2017-07-28 DIAGNOSIS — N186 End stage renal disease: Secondary | ICD-10-CM | POA: Diagnosis not present

## 2017-07-28 DIAGNOSIS — R51 Headache: Secondary | ICD-10-CM | POA: Diagnosis not present

## 2017-07-28 DIAGNOSIS — M109 Gout, unspecified: Secondary | ICD-10-CM | POA: Diagnosis not present

## 2017-07-28 DIAGNOSIS — D688 Other specified coagulation defects: Secondary | ICD-10-CM | POA: Diagnosis not present

## 2017-07-30 DIAGNOSIS — N2581 Secondary hyperparathyroidism of renal origin: Secondary | ICD-10-CM | POA: Diagnosis not present

## 2017-07-30 DIAGNOSIS — E1129 Type 2 diabetes mellitus with other diabetic kidney complication: Secondary | ICD-10-CM | POA: Diagnosis not present

## 2017-07-30 DIAGNOSIS — N186 End stage renal disease: Secondary | ICD-10-CM | POA: Diagnosis not present

## 2017-07-30 DIAGNOSIS — M109 Gout, unspecified: Secondary | ICD-10-CM | POA: Diagnosis not present

## 2017-07-30 DIAGNOSIS — D509 Iron deficiency anemia, unspecified: Secondary | ICD-10-CM | POA: Diagnosis not present

## 2017-07-30 DIAGNOSIS — D688 Other specified coagulation defects: Secondary | ICD-10-CM | POA: Diagnosis not present

## 2017-07-30 DIAGNOSIS — D631 Anemia in chronic kidney disease: Secondary | ICD-10-CM | POA: Diagnosis not present

## 2017-07-30 DIAGNOSIS — R51 Headache: Secondary | ICD-10-CM | POA: Diagnosis not present

## 2017-08-02 DIAGNOSIS — E1129 Type 2 diabetes mellitus with other diabetic kidney complication: Secondary | ICD-10-CM | POA: Diagnosis not present

## 2017-08-02 DIAGNOSIS — D688 Other specified coagulation defects: Secondary | ICD-10-CM | POA: Diagnosis not present

## 2017-08-02 DIAGNOSIS — M109 Gout, unspecified: Secondary | ICD-10-CM | POA: Diagnosis not present

## 2017-08-02 DIAGNOSIS — D509 Iron deficiency anemia, unspecified: Secondary | ICD-10-CM | POA: Diagnosis not present

## 2017-08-02 DIAGNOSIS — R51 Headache: Secondary | ICD-10-CM | POA: Diagnosis not present

## 2017-08-02 DIAGNOSIS — D631 Anemia in chronic kidney disease: Secondary | ICD-10-CM | POA: Diagnosis not present

## 2017-08-02 DIAGNOSIS — N186 End stage renal disease: Secondary | ICD-10-CM | POA: Diagnosis not present

## 2017-08-02 DIAGNOSIS — N2581 Secondary hyperparathyroidism of renal origin: Secondary | ICD-10-CM | POA: Diagnosis not present

## 2017-08-04 ENCOUNTER — Ambulatory Visit: Payer: Medicare Other | Admitting: Internal Medicine

## 2017-08-04 DIAGNOSIS — D631 Anemia in chronic kidney disease: Secondary | ICD-10-CM | POA: Diagnosis not present

## 2017-08-04 DIAGNOSIS — E1129 Type 2 diabetes mellitus with other diabetic kidney complication: Secondary | ICD-10-CM | POA: Diagnosis not present

## 2017-08-04 DIAGNOSIS — R51 Headache: Secondary | ICD-10-CM | POA: Diagnosis not present

## 2017-08-04 DIAGNOSIS — N186 End stage renal disease: Secondary | ICD-10-CM | POA: Diagnosis not present

## 2017-08-04 DIAGNOSIS — D688 Other specified coagulation defects: Secondary | ICD-10-CM | POA: Diagnosis not present

## 2017-08-04 DIAGNOSIS — M109 Gout, unspecified: Secondary | ICD-10-CM | POA: Diagnosis not present

## 2017-08-04 DIAGNOSIS — D509 Iron deficiency anemia, unspecified: Secondary | ICD-10-CM | POA: Diagnosis not present

## 2017-08-04 DIAGNOSIS — N2581 Secondary hyperparathyroidism of renal origin: Secondary | ICD-10-CM | POA: Diagnosis not present

## 2017-08-06 DIAGNOSIS — M109 Gout, unspecified: Secondary | ICD-10-CM | POA: Diagnosis not present

## 2017-08-06 DIAGNOSIS — D631 Anemia in chronic kidney disease: Secondary | ICD-10-CM | POA: Diagnosis not present

## 2017-08-06 DIAGNOSIS — N186 End stage renal disease: Secondary | ICD-10-CM | POA: Diagnosis not present

## 2017-08-06 DIAGNOSIS — D509 Iron deficiency anemia, unspecified: Secondary | ICD-10-CM | POA: Diagnosis not present

## 2017-08-06 DIAGNOSIS — N2581 Secondary hyperparathyroidism of renal origin: Secondary | ICD-10-CM | POA: Diagnosis not present

## 2017-08-06 DIAGNOSIS — D688 Other specified coagulation defects: Secondary | ICD-10-CM | POA: Diagnosis not present

## 2017-08-06 DIAGNOSIS — R51 Headache: Secondary | ICD-10-CM | POA: Diagnosis not present

## 2017-08-06 DIAGNOSIS — E1129 Type 2 diabetes mellitus with other diabetic kidney complication: Secondary | ICD-10-CM | POA: Diagnosis not present

## 2017-08-08 ENCOUNTER — Ambulatory Visit: Payer: Medicare Other | Admitting: Internal Medicine

## 2017-08-09 ENCOUNTER — Other Ambulatory Visit: Payer: Self-pay

## 2017-08-09 DIAGNOSIS — M109 Gout, unspecified: Secondary | ICD-10-CM | POA: Diagnosis not present

## 2017-08-09 DIAGNOSIS — R51 Headache: Secondary | ICD-10-CM | POA: Diagnosis not present

## 2017-08-09 DIAGNOSIS — D688 Other specified coagulation defects: Secondary | ICD-10-CM | POA: Diagnosis not present

## 2017-08-09 DIAGNOSIS — D631 Anemia in chronic kidney disease: Secondary | ICD-10-CM | POA: Diagnosis not present

## 2017-08-09 DIAGNOSIS — N186 End stage renal disease: Secondary | ICD-10-CM | POA: Diagnosis not present

## 2017-08-09 DIAGNOSIS — N2581 Secondary hyperparathyroidism of renal origin: Secondary | ICD-10-CM | POA: Diagnosis not present

## 2017-08-09 DIAGNOSIS — D509 Iron deficiency anemia, unspecified: Secondary | ICD-10-CM | POA: Diagnosis not present

## 2017-08-09 DIAGNOSIS — E1129 Type 2 diabetes mellitus with other diabetic kidney complication: Secondary | ICD-10-CM | POA: Diagnosis not present

## 2017-08-11 ENCOUNTER — Encounter: Payer: Self-pay | Admitting: Family Medicine

## 2017-08-11 DIAGNOSIS — N2581 Secondary hyperparathyroidism of renal origin: Secondary | ICD-10-CM | POA: Diagnosis not present

## 2017-08-11 DIAGNOSIS — D688 Other specified coagulation defects: Secondary | ICD-10-CM | POA: Diagnosis not present

## 2017-08-11 DIAGNOSIS — E1129 Type 2 diabetes mellitus with other diabetic kidney complication: Secondary | ICD-10-CM | POA: Diagnosis not present

## 2017-08-11 DIAGNOSIS — M109 Gout, unspecified: Secondary | ICD-10-CM | POA: Diagnosis not present

## 2017-08-11 DIAGNOSIS — D631 Anemia in chronic kidney disease: Secondary | ICD-10-CM | POA: Diagnosis not present

## 2017-08-11 DIAGNOSIS — N186 End stage renal disease: Secondary | ICD-10-CM | POA: Diagnosis not present

## 2017-08-11 DIAGNOSIS — R51 Headache: Secondary | ICD-10-CM | POA: Diagnosis not present

## 2017-08-11 DIAGNOSIS — D509 Iron deficiency anemia, unspecified: Secondary | ICD-10-CM | POA: Diagnosis not present

## 2017-08-12 ENCOUNTER — Other Ambulatory Visit: Payer: Self-pay | Admitting: Vascular Surgery

## 2017-08-13 DIAGNOSIS — R51 Headache: Secondary | ICD-10-CM | POA: Diagnosis not present

## 2017-08-13 DIAGNOSIS — D631 Anemia in chronic kidney disease: Secondary | ICD-10-CM | POA: Diagnosis not present

## 2017-08-13 DIAGNOSIS — M109 Gout, unspecified: Secondary | ICD-10-CM | POA: Diagnosis not present

## 2017-08-13 DIAGNOSIS — D688 Other specified coagulation defects: Secondary | ICD-10-CM | POA: Diagnosis not present

## 2017-08-13 DIAGNOSIS — E1129 Type 2 diabetes mellitus with other diabetic kidney complication: Secondary | ICD-10-CM | POA: Diagnosis not present

## 2017-08-13 DIAGNOSIS — N186 End stage renal disease: Secondary | ICD-10-CM | POA: Diagnosis not present

## 2017-08-13 DIAGNOSIS — D509 Iron deficiency anemia, unspecified: Secondary | ICD-10-CM | POA: Diagnosis not present

## 2017-08-13 DIAGNOSIS — N2581 Secondary hyperparathyroidism of renal origin: Secondary | ICD-10-CM | POA: Diagnosis not present

## 2017-08-16 DIAGNOSIS — R51 Headache: Secondary | ICD-10-CM | POA: Diagnosis not present

## 2017-08-16 DIAGNOSIS — E1129 Type 2 diabetes mellitus with other diabetic kidney complication: Secondary | ICD-10-CM | POA: Diagnosis not present

## 2017-08-16 DIAGNOSIS — M109 Gout, unspecified: Secondary | ICD-10-CM | POA: Diagnosis not present

## 2017-08-16 DIAGNOSIS — D509 Iron deficiency anemia, unspecified: Secondary | ICD-10-CM | POA: Diagnosis not present

## 2017-08-16 DIAGNOSIS — D631 Anemia in chronic kidney disease: Secondary | ICD-10-CM | POA: Diagnosis not present

## 2017-08-16 DIAGNOSIS — N186 End stage renal disease: Secondary | ICD-10-CM | POA: Diagnosis not present

## 2017-08-16 DIAGNOSIS — N2581 Secondary hyperparathyroidism of renal origin: Secondary | ICD-10-CM | POA: Diagnosis not present

## 2017-08-16 DIAGNOSIS — D688 Other specified coagulation defects: Secondary | ICD-10-CM | POA: Diagnosis not present

## 2017-08-18 DIAGNOSIS — Z23 Encounter for immunization: Secondary | ICD-10-CM | POA: Diagnosis not present

## 2017-08-18 DIAGNOSIS — L299 Pruritus, unspecified: Secondary | ICD-10-CM | POA: Diagnosis not present

## 2017-08-18 DIAGNOSIS — R51 Headache: Secondary | ICD-10-CM | POA: Diagnosis not present

## 2017-08-18 DIAGNOSIS — E1129 Type 2 diabetes mellitus with other diabetic kidney complication: Secondary | ICD-10-CM | POA: Diagnosis not present

## 2017-08-18 DIAGNOSIS — N2581 Secondary hyperparathyroidism of renal origin: Secondary | ICD-10-CM | POA: Diagnosis not present

## 2017-08-18 DIAGNOSIS — N186 End stage renal disease: Secondary | ICD-10-CM | POA: Diagnosis not present

## 2017-08-18 DIAGNOSIS — D509 Iron deficiency anemia, unspecified: Secondary | ICD-10-CM | POA: Diagnosis not present

## 2017-08-18 DIAGNOSIS — D688 Other specified coagulation defects: Secondary | ICD-10-CM | POA: Diagnosis not present

## 2017-08-19 ENCOUNTER — Encounter: Payer: Self-pay | Admitting: Internal Medicine

## 2017-08-19 ENCOUNTER — Ambulatory Visit (INDEPENDENT_AMBULATORY_CARE_PROVIDER_SITE_OTHER): Payer: Medicare Other | Admitting: Internal Medicine

## 2017-08-19 ENCOUNTER — Other Ambulatory Visit: Payer: Self-pay

## 2017-08-19 DIAGNOSIS — L03012 Cellulitis of left finger: Secondary | ICD-10-CM | POA: Diagnosis not present

## 2017-08-19 DIAGNOSIS — R21 Rash and other nonspecific skin eruption: Secondary | ICD-10-CM | POA: Diagnosis not present

## 2017-08-19 MED ORDER — TRIAMCINOLONE ACETONIDE 0.5 % EX OINT: 1.0000 "application " | TOPICAL_OINTMENT | Freq: Two times a day (BID) | CUTANEOUS | 0 refills | Status: DC

## 2017-08-19 MED ORDER — CEPHALEXIN 500 MG PO CAPS: 500.0000 mg | ORAL_CAPSULE | Freq: Three times a day (TID) | ORAL | 0 refills | Status: DC

## 2017-08-19 MED ORDER — HYDROXYZINE HCL 10 MG PO TABS: 10.0000 mg | ORAL_TABLET | Freq: Three times a day (TID) | ORAL | 0 refills | Status: DC | PRN

## 2017-08-19 NOTE — Patient Instructions (Signed)
It was so nice to see you!  I think you have a bacterial infection of your nail bed. You don't have any signs of an abscess, so I don't think you need to have it drained at this point. I have prescribed some antibiotics for this. Please use Keflex three times per day for 7 days. You should also soak your finger in warm water for 10-15 minutes multiple times per day. If this is not getting better after finishing the antibiotics, please come back to see [place]!  -Dr. Brett Albino

## 2017-08-20 DIAGNOSIS — D509 Iron deficiency anemia, unspecified: Secondary | ICD-10-CM | POA: Diagnosis not present

## 2017-08-20 DIAGNOSIS — N2581 Secondary hyperparathyroidism of renal origin: Secondary | ICD-10-CM | POA: Diagnosis not present

## 2017-08-20 DIAGNOSIS — D688 Other specified coagulation defects: Secondary | ICD-10-CM | POA: Diagnosis not present

## 2017-08-20 DIAGNOSIS — E1129 Type 2 diabetes mellitus with other diabetic kidney complication: Secondary | ICD-10-CM | POA: Diagnosis not present

## 2017-08-20 DIAGNOSIS — Z23 Encounter for immunization: Secondary | ICD-10-CM | POA: Diagnosis not present

## 2017-08-20 DIAGNOSIS — R51 Headache: Secondary | ICD-10-CM | POA: Diagnosis not present

## 2017-08-20 DIAGNOSIS — L299 Pruritus, unspecified: Secondary | ICD-10-CM | POA: Diagnosis not present

## 2017-08-20 DIAGNOSIS — N186 End stage renal disease: Secondary | ICD-10-CM | POA: Diagnosis not present

## 2017-08-22 DIAGNOSIS — L03012 Cellulitis of left finger: Secondary | ICD-10-CM | POA: Insufficient documentation

## 2017-08-22 NOTE — Progress Notes (Signed)
   [facility] Phone: [phone]  Subjective:  Clayton is a 77 year old female presenting to clinic with left second finger pain. This has been going on for 1 week. She describes the pain as "soreness". She has tried epsom salt soaks. She has also tried putting alcohol on her finger, which has not helped. She denies any trauma. She states she just woke up in the morning and it was hurting. She endorses swelling of her fingertip. No redness, no warmth. She wonder if she has gout in her finger.  ROS: See HPI for pertinent positives and negatives  Past Medical History- diastolic CHF, HTN, I7JL, ESRD, HLD, morbid obesity  Family history reviewed for today's visit. No changes.  Social history- patient is a never smoker  Objective: BP 134/60   Pulse 90   Temp 98.4 F (36.9 C) (Oral)   Wt 228 lb (103.4 kg)   SpO2 99%   BMI 41.70 kg/m  Gen: NAD, alert, cooperative with exam Msk: Left second finger with erythema and edema at the base of the nail. No abscess noted. No drainage.  Assessment/Plan: Paronychia: Of the left 2nd finger. Has not improved with warm soaks. - Will prescribe Keflex 500mg  tid x 7 days (per up-to-date recommendations)  - Continue warm soaks multiple times per day - Follow-up if no improvement   Hyman Bible, MD PGY-3

## 2017-08-22 NOTE — Assessment & Plan Note (Signed)
Of the left 2nd finger. Has not improved with warm soaks. - Will prescribe Keflex 500mg  tid x 7 days (per up-to-date recommendations)  - Continue warm soaks multiple times per day - Follow-up if no improvement

## 2017-08-23 DIAGNOSIS — E1129 Type 2 diabetes mellitus with other diabetic kidney complication: Secondary | ICD-10-CM | POA: Diagnosis not present

## 2017-08-23 DIAGNOSIS — N2581 Secondary hyperparathyroidism of renal origin: Secondary | ICD-10-CM | POA: Diagnosis not present

## 2017-08-23 DIAGNOSIS — Z23 Encounter for immunization: Secondary | ICD-10-CM | POA: Diagnosis not present

## 2017-08-23 DIAGNOSIS — N186 End stage renal disease: Secondary | ICD-10-CM | POA: Diagnosis not present

## 2017-08-23 DIAGNOSIS — L299 Pruritus, unspecified: Secondary | ICD-10-CM | POA: Diagnosis not present

## 2017-08-23 DIAGNOSIS — D688 Other specified coagulation defects: Secondary | ICD-10-CM | POA: Diagnosis not present

## 2017-08-23 DIAGNOSIS — R51 Headache: Secondary | ICD-10-CM | POA: Diagnosis not present

## 2017-08-23 DIAGNOSIS — D509 Iron deficiency anemia, unspecified: Secondary | ICD-10-CM | POA: Diagnosis not present

## 2017-08-25 DIAGNOSIS — D688 Other specified coagulation defects: Secondary | ICD-10-CM | POA: Diagnosis not present

## 2017-08-25 DIAGNOSIS — Z23 Encounter for immunization: Secondary | ICD-10-CM | POA: Diagnosis not present

## 2017-08-25 DIAGNOSIS — N186 End stage renal disease: Secondary | ICD-10-CM | POA: Diagnosis not present

## 2017-08-25 DIAGNOSIS — E1129 Type 2 diabetes mellitus with other diabetic kidney complication: Secondary | ICD-10-CM | POA: Diagnosis not present

## 2017-08-25 DIAGNOSIS — R51 Headache: Secondary | ICD-10-CM | POA: Diagnosis not present

## 2017-08-25 DIAGNOSIS — N2581 Secondary hyperparathyroidism of renal origin: Secondary | ICD-10-CM | POA: Diagnosis not present

## 2017-08-25 DIAGNOSIS — D509 Iron deficiency anemia, unspecified: Secondary | ICD-10-CM | POA: Diagnosis not present

## 2017-08-25 DIAGNOSIS — L299 Pruritus, unspecified: Secondary | ICD-10-CM | POA: Diagnosis not present

## 2017-08-27 DIAGNOSIS — R51 Headache: Secondary | ICD-10-CM | POA: Diagnosis not present

## 2017-08-27 DIAGNOSIS — L299 Pruritus, unspecified: Secondary | ICD-10-CM | POA: Diagnosis not present

## 2017-08-27 DIAGNOSIS — Z23 Encounter for immunization: Secondary | ICD-10-CM | POA: Diagnosis not present

## 2017-08-27 DIAGNOSIS — N2581 Secondary hyperparathyroidism of renal origin: Secondary | ICD-10-CM | POA: Diagnosis not present

## 2017-08-27 DIAGNOSIS — E1129 Type 2 diabetes mellitus with other diabetic kidney complication: Secondary | ICD-10-CM | POA: Diagnosis not present

## 2017-08-27 DIAGNOSIS — D688 Other specified coagulation defects: Secondary | ICD-10-CM | POA: Diagnosis not present

## 2017-08-27 DIAGNOSIS — N186 End stage renal disease: Secondary | ICD-10-CM | POA: Diagnosis not present

## 2017-08-27 DIAGNOSIS — D509 Iron deficiency anemia, unspecified: Secondary | ICD-10-CM | POA: Diagnosis not present

## 2017-08-30 DIAGNOSIS — D509 Iron deficiency anemia, unspecified: Secondary | ICD-10-CM | POA: Diagnosis not present

## 2017-08-30 DIAGNOSIS — N186 End stage renal disease: Secondary | ICD-10-CM | POA: Diagnosis not present

## 2017-08-30 DIAGNOSIS — D688 Other specified coagulation defects: Secondary | ICD-10-CM | POA: Diagnosis not present

## 2017-08-30 DIAGNOSIS — E1129 Type 2 diabetes mellitus with other diabetic kidney complication: Secondary | ICD-10-CM | POA: Diagnosis not present

## 2017-08-30 DIAGNOSIS — Z23 Encounter for immunization: Secondary | ICD-10-CM | POA: Diagnosis not present

## 2017-08-30 DIAGNOSIS — N2581 Secondary hyperparathyroidism of renal origin: Secondary | ICD-10-CM | POA: Diagnosis not present

## 2017-08-30 DIAGNOSIS — R51 Headache: Secondary | ICD-10-CM | POA: Diagnosis not present

## 2017-08-30 DIAGNOSIS — L299 Pruritus, unspecified: Secondary | ICD-10-CM | POA: Diagnosis not present

## 2017-09-01 DIAGNOSIS — D688 Other specified coagulation defects: Secondary | ICD-10-CM | POA: Diagnosis not present

## 2017-09-01 DIAGNOSIS — Z23 Encounter for immunization: Secondary | ICD-10-CM | POA: Diagnosis not present

## 2017-09-01 DIAGNOSIS — N2581 Secondary hyperparathyroidism of renal origin: Secondary | ICD-10-CM | POA: Diagnosis not present

## 2017-09-01 DIAGNOSIS — L299 Pruritus, unspecified: Secondary | ICD-10-CM | POA: Diagnosis not present

## 2017-09-01 DIAGNOSIS — R51 Headache: Secondary | ICD-10-CM | POA: Diagnosis not present

## 2017-09-01 DIAGNOSIS — N186 End stage renal disease: Secondary | ICD-10-CM | POA: Diagnosis not present

## 2017-09-01 DIAGNOSIS — D509 Iron deficiency anemia, unspecified: Secondary | ICD-10-CM | POA: Diagnosis not present

## 2017-09-01 DIAGNOSIS — E1129 Type 2 diabetes mellitus with other diabetic kidney complication: Secondary | ICD-10-CM | POA: Diagnosis not present

## 2017-09-03 DIAGNOSIS — R51 Headache: Secondary | ICD-10-CM | POA: Diagnosis not present

## 2017-09-03 DIAGNOSIS — N186 End stage renal disease: Secondary | ICD-10-CM | POA: Diagnosis not present

## 2017-09-03 DIAGNOSIS — Z23 Encounter for immunization: Secondary | ICD-10-CM | POA: Diagnosis not present

## 2017-09-03 DIAGNOSIS — D509 Iron deficiency anemia, unspecified: Secondary | ICD-10-CM | POA: Diagnosis not present

## 2017-09-03 DIAGNOSIS — E1129 Type 2 diabetes mellitus with other diabetic kidney complication: Secondary | ICD-10-CM | POA: Diagnosis not present

## 2017-09-03 DIAGNOSIS — N2581 Secondary hyperparathyroidism of renal origin: Secondary | ICD-10-CM | POA: Diagnosis not present

## 2017-09-03 DIAGNOSIS — L299 Pruritus, unspecified: Secondary | ICD-10-CM | POA: Diagnosis not present

## 2017-09-03 DIAGNOSIS — D688 Other specified coagulation defects: Secondary | ICD-10-CM | POA: Diagnosis not present

## 2017-09-06 ENCOUNTER — Other Ambulatory Visit: Payer: Self-pay

## 2017-09-06 DIAGNOSIS — Z23 Encounter for immunization: Secondary | ICD-10-CM | POA: Diagnosis not present

## 2017-09-06 DIAGNOSIS — N186 End stage renal disease: Secondary | ICD-10-CM | POA: Diagnosis not present

## 2017-09-06 DIAGNOSIS — D688 Other specified coagulation defects: Secondary | ICD-10-CM | POA: Diagnosis not present

## 2017-09-06 DIAGNOSIS — R51 Headache: Secondary | ICD-10-CM | POA: Diagnosis not present

## 2017-09-06 DIAGNOSIS — L299 Pruritus, unspecified: Secondary | ICD-10-CM | POA: Diagnosis not present

## 2017-09-06 DIAGNOSIS — N2581 Secondary hyperparathyroidism of renal origin: Secondary | ICD-10-CM | POA: Diagnosis not present

## 2017-09-06 DIAGNOSIS — D509 Iron deficiency anemia, unspecified: Secondary | ICD-10-CM | POA: Diagnosis not present

## 2017-09-06 DIAGNOSIS — I998 Other disorder of circulatory system: Secondary | ICD-10-CM

## 2017-09-06 DIAGNOSIS — E1129 Type 2 diabetes mellitus with other diabetic kidney complication: Secondary | ICD-10-CM | POA: Diagnosis not present

## 2017-09-07 ENCOUNTER — Encounter: Payer: Self-pay | Admitting: Vascular Surgery

## 2017-09-07 ENCOUNTER — Ambulatory Visit (INDEPENDENT_AMBULATORY_CARE_PROVIDER_SITE_OTHER): Payer: Medicare Other | Admitting: Vascular Surgery

## 2017-09-07 ENCOUNTER — Ambulatory Visit (HOSPITAL_COMMUNITY)
Admission: RE | Admit: 2017-09-07 | Discharge: 2017-09-07 | Disposition: A | Discharge status: Home / Self Care | Payer: Medicare Other | Source: Ambulatory Visit | Attending: Vascular Surgery | Admitting: Vascular Surgery

## 2017-09-07 VITALS — BP 135/62 | HR 77 | Temp 98.3°F | Resp 18 | Ht 62.0 in | Wt 228.0 lb

## 2017-09-07 DIAGNOSIS — I998 Other disorder of circulatory system: Secondary | ICD-10-CM | POA: Diagnosis not present

## 2017-09-07 DIAGNOSIS — M7989 Other specified soft tissue disorders: Secondary | ICD-10-CM | POA: Diagnosis not present

## 2017-09-07 DIAGNOSIS — N186 End stage renal disease: Secondary | ICD-10-CM

## 2017-09-07 DIAGNOSIS — M79643 Pain in unspecified hand: Secondary | ICD-10-CM | POA: Insufficient documentation

## 2017-09-07 DIAGNOSIS — T82898A Other specified complication of vascular prosthetic devices, implants and grafts, initial encounter: Secondary | ICD-10-CM

## 2017-09-07 NOTE — Progress Notes (Signed)
    Postoperative Access Visit   History of Present Illness   Rachel Hobbs is a 78 y.o. year old female who presents for postoperative follow-up for: left upper arm arteriovenous graft with Dr. Early(Date: 05/20/17).  The patient's wounds are healed.  The patient notes L 2nd finger pain for one month.  Pt has been using the LUA AVG during HD.  She denies increased pain while on HD.  She notes sharp pain without obvious pattern.  Pt denies weakness or numbness  In L hand.  Pt has not developed any wounds.  Pt was reported treated for paronychia in R 2nd finger with antibiotics.  The patient is able to complete their activities of daily living.     Physical Examination   Vitals:   09/07/17 1239  BP: 135/62  Pulse: 77  Resp: 18  Temp: 98.3 F (36.8 C)  TempSrc: Oral  SpO2: 100%  Weight: 228 lb (103.4 kg)  Height: 5\' 2"  (1.575 m)   Body mass index is 41.7 kg/m.  left arm Incisions are healed, skin feels warm in upper arm and forearm, L hand somewhat cooler, hand grip is 3-4/5 secondary to pain L 2nd finger, no erythema or fluid ballotable in L 2nd finger, sensation in digits is intact, palpable thrill through upper arm AVG, bruit can be auscultated, faint palpable L radial pulse    L steal study (09/07/2017): radial artery 74 c/s, 153 c/s with compression of AVG   Medical Decision Making   Rachel Hobbs is a 77 y.o. year old female who presents s/p left upper arm arteriovenous graft without significant interference with ADL currently, L 2nd finger pain   I'm not really convinced this is steal syndrome especially with palpable radial pulse.  I did offer the patient L arm angiogram to get a definitive diagnosis, but pt declined.  She is going to follow up in one month with Dr. Donnetta Hutching for re-evaluation.  Thank you for allowing [place] to participate in this patient's care.   Adele Barthel, MD, FACS [facility] [place] Office: [phone] Pager: [phone]

## 2017-09-08 DIAGNOSIS — R51 Headache: Secondary | ICD-10-CM | POA: Diagnosis not present

## 2017-09-08 DIAGNOSIS — D688 Other specified coagulation defects: Secondary | ICD-10-CM | POA: Diagnosis not present

## 2017-09-08 DIAGNOSIS — D509 Iron deficiency anemia, unspecified: Secondary | ICD-10-CM | POA: Diagnosis not present

## 2017-09-08 DIAGNOSIS — E1129 Type 2 diabetes mellitus with other diabetic kidney complication: Secondary | ICD-10-CM | POA: Diagnosis not present

## 2017-09-08 DIAGNOSIS — Z23 Encounter for immunization: Secondary | ICD-10-CM | POA: Diagnosis not present

## 2017-09-08 DIAGNOSIS — N2581 Secondary hyperparathyroidism of renal origin: Secondary | ICD-10-CM | POA: Diagnosis not present

## 2017-09-08 DIAGNOSIS — L299 Pruritus, unspecified: Secondary | ICD-10-CM | POA: Diagnosis not present

## 2017-09-08 DIAGNOSIS — N186 End stage renal disease: Secondary | ICD-10-CM | POA: Diagnosis not present

## 2017-09-10 DIAGNOSIS — N2581 Secondary hyperparathyroidism of renal origin: Secondary | ICD-10-CM | POA: Diagnosis not present

## 2017-09-10 DIAGNOSIS — R51 Headache: Secondary | ICD-10-CM | POA: Diagnosis not present

## 2017-09-10 DIAGNOSIS — Z23 Encounter for immunization: Secondary | ICD-10-CM | POA: Diagnosis not present

## 2017-09-10 DIAGNOSIS — D509 Iron deficiency anemia, unspecified: Secondary | ICD-10-CM | POA: Diagnosis not present

## 2017-09-10 DIAGNOSIS — D688 Other specified coagulation defects: Secondary | ICD-10-CM | POA: Diagnosis not present

## 2017-09-10 DIAGNOSIS — E1129 Type 2 diabetes mellitus with other diabetic kidney complication: Secondary | ICD-10-CM | POA: Diagnosis not present

## 2017-09-10 DIAGNOSIS — L299 Pruritus, unspecified: Secondary | ICD-10-CM | POA: Diagnosis not present

## 2017-09-10 DIAGNOSIS — N186 End stage renal disease: Secondary | ICD-10-CM | POA: Diagnosis not present

## 2017-09-13 DIAGNOSIS — N186 End stage renal disease: Secondary | ICD-10-CM | POA: Diagnosis not present

## 2017-09-13 DIAGNOSIS — Z23 Encounter for immunization: Secondary | ICD-10-CM | POA: Diagnosis not present

## 2017-09-13 DIAGNOSIS — D509 Iron deficiency anemia, unspecified: Secondary | ICD-10-CM | POA: Diagnosis not present

## 2017-09-13 DIAGNOSIS — N2581 Secondary hyperparathyroidism of renal origin: Secondary | ICD-10-CM | POA: Diagnosis not present

## 2017-09-13 DIAGNOSIS — D688 Other specified coagulation defects: Secondary | ICD-10-CM | POA: Diagnosis not present

## 2017-09-13 DIAGNOSIS — R51 Headache: Secondary | ICD-10-CM | POA: Diagnosis not present

## 2017-09-13 DIAGNOSIS — L299 Pruritus, unspecified: Secondary | ICD-10-CM | POA: Diagnosis not present

## 2017-09-13 DIAGNOSIS — E1129 Type 2 diabetes mellitus with other diabetic kidney complication: Secondary | ICD-10-CM | POA: Diagnosis not present

## 2017-09-15 DIAGNOSIS — D509 Iron deficiency anemia, unspecified: Secondary | ICD-10-CM | POA: Diagnosis not present

## 2017-09-15 DIAGNOSIS — E1129 Type 2 diabetes mellitus with other diabetic kidney complication: Secondary | ICD-10-CM | POA: Diagnosis not present

## 2017-09-15 DIAGNOSIS — N186 End stage renal disease: Secondary | ICD-10-CM | POA: Diagnosis not present

## 2017-09-15 DIAGNOSIS — D688 Other specified coagulation defects: Secondary | ICD-10-CM | POA: Diagnosis not present

## 2017-09-15 DIAGNOSIS — L299 Pruritus, unspecified: Secondary | ICD-10-CM | POA: Diagnosis not present

## 2017-09-15 DIAGNOSIS — R51 Headache: Secondary | ICD-10-CM | POA: Diagnosis not present

## 2017-09-15 DIAGNOSIS — N2581 Secondary hyperparathyroidism of renal origin: Secondary | ICD-10-CM | POA: Diagnosis not present

## 2017-09-15 DIAGNOSIS — Z23 Encounter for immunization: Secondary | ICD-10-CM | POA: Diagnosis not present

## 2017-09-16 ENCOUNTER — Ambulatory Visit: Payer: Medicare Other | Admitting: Podiatry

## 2017-09-16 ENCOUNTER — Encounter: Payer: Self-pay | Admitting: Podiatry

## 2017-09-16 DIAGNOSIS — E1129 Type 2 diabetes mellitus with other diabetic kidney complication: Secondary | ICD-10-CM | POA: Diagnosis not present

## 2017-09-16 DIAGNOSIS — E1151 Type 2 diabetes mellitus with diabetic peripheral angiopathy without gangrene: Secondary | ICD-10-CM

## 2017-09-16 DIAGNOSIS — N186 End stage renal disease: Secondary | ICD-10-CM | POA: Diagnosis not present

## 2017-09-16 DIAGNOSIS — M79609 Pain in unspecified limb: Secondary | ICD-10-CM

## 2017-09-16 DIAGNOSIS — Z794 Long term (current) use of insulin: Secondary | ICD-10-CM | POA: Diagnosis not present

## 2017-09-16 DIAGNOSIS — B351 Tinea unguium: Secondary | ICD-10-CM | POA: Diagnosis not present

## 2017-09-16 DIAGNOSIS — Z992 Dependence on renal dialysis: Secondary | ICD-10-CM | POA: Diagnosis not present

## 2017-09-16 NOTE — Progress Notes (Signed)
Patient ID: Rachel Hobbs, female   DOB: 02-02-1941, 77 y.o.   MRN: [id number] HPI  Complaint:  Visit Type: Patient returns to my office for continued preventative foot care services. Complaint: Patient states" my nails have grown long and thick and become painful to walk and wear shoes" Patient has been diagnosed with DM . This patient  presents for preventative foot care services. No changes to ROS  Podiatric Exam: Vascular: dorsalis pedis and posterior tibial pulses are non palpable due to swelling both feet.. Capillary return is slow to refill. Temperature gradient is negative. Skin turgor WNL, bilateral swelling  Sensorium: Diminished  Semmes Weinstein monofilament test. Normal tactile sensation bilaterally.  Nail Exam: Pt has thick disfigured discolored nails with subungual debris noted bilateral entire nail hallux through fifth toenails.  Great hallux nail left foot is severely painful.  Second toenail left is unattached from nail bed. Ulcer Exam: There is no evidence of ulcer or pre-ulcerative changes or infection. Orthopedic Exam: Muscle tone and strength are WNL. No limitations in general ROM. No crepitus or effusions noted. Foot type and digits show no abnormalities.  HAV  B/L Skin: No Porokeratosis. No infection or ulcers.  Desquamated skin distal aspect right hallux.  Diagnosis:  Onychomycosis, Pain in right toe, pain in left toes  Treatment & Plan Procedures and Treatment: Consent by patient was obtained for treatment procedures. The patient understood the discussion of treatment and procedures well. All questions were answered thoroughly reviewed. Debridement of mycotic and hypertrophic toenails, 1 through 5 bilateral and clearing of subungual debris. No ulceration, no infection noted.  Return Visit-Office Procedure: Patient instructed to return to the office for a follow up visit 10 weeks  for continued evaluation and treatment.   Gardiner Barefoot DPM

## 2017-09-17 DIAGNOSIS — D631 Anemia in chronic kidney disease: Secondary | ICD-10-CM | POA: Diagnosis not present

## 2017-09-17 DIAGNOSIS — R739 Hyperglycemia, unspecified: Secondary | ICD-10-CM | POA: Diagnosis not present

## 2017-09-17 DIAGNOSIS — R51 Headache: Secondary | ICD-10-CM | POA: Diagnosis not present

## 2017-09-17 DIAGNOSIS — D509 Iron deficiency anemia, unspecified: Secondary | ICD-10-CM | POA: Diagnosis not present

## 2017-09-17 DIAGNOSIS — N186 End stage renal disease: Secondary | ICD-10-CM | POA: Diagnosis not present

## 2017-09-17 DIAGNOSIS — L299 Pruritus, unspecified: Secondary | ICD-10-CM | POA: Diagnosis not present

## 2017-09-17 DIAGNOSIS — D688 Other specified coagulation defects: Secondary | ICD-10-CM | POA: Diagnosis not present

## 2017-09-17 DIAGNOSIS — N2581 Secondary hyperparathyroidism of renal origin: Secondary | ICD-10-CM | POA: Diagnosis not present

## 2017-09-17 DIAGNOSIS — E1129 Type 2 diabetes mellitus with other diabetic kidney complication: Secondary | ICD-10-CM | POA: Diagnosis not present

## 2017-09-20 DIAGNOSIS — D631 Anemia in chronic kidney disease: Secondary | ICD-10-CM | POA: Diagnosis not present

## 2017-09-20 DIAGNOSIS — N186 End stage renal disease: Secondary | ICD-10-CM | POA: Diagnosis not present

## 2017-09-20 DIAGNOSIS — N2581 Secondary hyperparathyroidism of renal origin: Secondary | ICD-10-CM | POA: Diagnosis not present

## 2017-09-20 DIAGNOSIS — R51 Headache: Secondary | ICD-10-CM | POA: Diagnosis not present

## 2017-09-20 DIAGNOSIS — D509 Iron deficiency anemia, unspecified: Secondary | ICD-10-CM | POA: Diagnosis not present

## 2017-09-20 DIAGNOSIS — E1129 Type 2 diabetes mellitus with other diabetic kidney complication: Secondary | ICD-10-CM | POA: Diagnosis not present

## 2017-09-20 DIAGNOSIS — L299 Pruritus, unspecified: Secondary | ICD-10-CM | POA: Diagnosis not present

## 2017-09-20 DIAGNOSIS — R739 Hyperglycemia, unspecified: Secondary | ICD-10-CM | POA: Diagnosis not present

## 2017-09-20 DIAGNOSIS — D688 Other specified coagulation defects: Secondary | ICD-10-CM | POA: Diagnosis not present

## 2017-09-22 DIAGNOSIS — D509 Iron deficiency anemia, unspecified: Secondary | ICD-10-CM | POA: Diagnosis not present

## 2017-09-22 DIAGNOSIS — N2581 Secondary hyperparathyroidism of renal origin: Secondary | ICD-10-CM | POA: Diagnosis not present

## 2017-09-22 DIAGNOSIS — L299 Pruritus, unspecified: Secondary | ICD-10-CM | POA: Diagnosis not present

## 2017-09-22 DIAGNOSIS — R739 Hyperglycemia, unspecified: Secondary | ICD-10-CM | POA: Diagnosis not present

## 2017-09-22 DIAGNOSIS — D688 Other specified coagulation defects: Secondary | ICD-10-CM | POA: Diagnosis not present

## 2017-09-22 DIAGNOSIS — R51 Headache: Secondary | ICD-10-CM | POA: Diagnosis not present

## 2017-09-22 DIAGNOSIS — E1129 Type 2 diabetes mellitus with other diabetic kidney complication: Secondary | ICD-10-CM | POA: Diagnosis not present

## 2017-09-22 DIAGNOSIS — D631 Anemia in chronic kidney disease: Secondary | ICD-10-CM | POA: Diagnosis not present

## 2017-09-22 DIAGNOSIS — N186 End stage renal disease: Secondary | ICD-10-CM | POA: Diagnosis not present

## 2017-09-22 NOTE — Progress Notes (Addendum)
   Subjective:   Patient ID: Rachel Hobbs    DOB: 09/09/1940, 77 y.o. female   MRN: [id number]  Rachel Hobbs is a 77 y.o. female with a history of HTN, CHF, DM2, ESRD on HD, gout here for   Itching all over Patient endorses persistent diffuse pruritic rash that she states started 3-4 months ago however per chart review has been seen for similar complaint as far back as October 2018. She has been prescribed atarax, eucerin, clobetasol with use of OTC cortisone cream and benadryl with no improvement. She denies new medications, tick/insect/pet exposure, new soaps or detergents, recent travel. No fever, mouth sores, joint pain or swelling, difficulty breathing.   Review of Systems:  Per HPI.   [place], medications and smoking status reviewed.  Objective:   BP 122/66   Pulse 87   Temp 98 F (36.7 C) (Oral)   Wt 225 lb (102.1 kg)   SpO2 99%   BMI 41.15 kg/m  Vitals and nursing note reviewed.  General: obese female, in no acute distress with non-toxic appearance HEENT: normocephalic, atraumatic, moist mucous membranes CV: regular rate and rhythm without murmurs, rubs, or gallops Lungs: clear to auscultation bilaterally with normal work of breathing Abdomen: soft, non-tender, obese abdomen with large ventral hernia. Normoactive bowel sounds. Skin: warm, dry. Diffuse hyperpigmented hyperkeratotic lesions present on L shoulder back, lower abdomen, and thighs bilaterally with worsened hyperkeratosis to lower abdomen and thighs. Few excoriations with bleeding noted. Extremities: warm and well perfused, normal tone Neuro: Alert and oriented, speech normal  Assessment & Plan:   Rash and nonspecific skin eruption Unclear etiology with no improvement with topical steroids or antihistamines. Question whether it may be uremic pruritis with hyperkeratotic scarring from chronic excoriation. Will prescribe low dose gabapentin and prednisone burst to aid with itching and refer to Dermatology for closer  evaluation and possible biopsy. Advised patient to closely monitor CBGs while on prednisone and increase Lantus 1u for every CBG >150 given recent worsening in A1c. Follow up with PCP.  Orders Placed This Encounter  Procedures  . Ambulatory referral to Dermatology    Referral Priority:   Routine    Referral Type:   Consultation    Referral Reason:   Specialty Services Required    Requested Specialty:   Dermatology    Number of Visits Requested:   1  . HgB A1c   Meds ordered this encounter  Medications  . predniSONE (DELTASONE) 50 MG tablet    Sig: Take 1 tablet (50 mg total) by mouth daily with breakfast for 5 days.    Dispense:  5 tablet    Refill:  0  . gabapentin (NEURONTIN) 100 MG capsule    Sig: Take 1 capsule (100 mg total) by mouth 2 (two) times daily for 15 days.    Dispense:  30 capsule    Refill:  0    Rory Percy, DO PGY-1, [facility] 09/23/2017 12:34 PM

## 2017-09-23 ENCOUNTER — Encounter: Payer: Self-pay | Admitting: Family Medicine

## 2017-09-23 ENCOUNTER — Telehealth: Payer: Self-pay | Admitting: *Deleted

## 2017-09-23 ENCOUNTER — Other Ambulatory Visit: Payer: Self-pay

## 2017-09-23 ENCOUNTER — Ambulatory Visit (INDEPENDENT_AMBULATORY_CARE_PROVIDER_SITE_OTHER): Payer: Medicare Other | Admitting: Family Medicine

## 2017-09-23 VITALS — BP 122/66 | HR 87 | Temp 98.0°F | Wt 225.0 lb

## 2017-09-23 DIAGNOSIS — E118 Type 2 diabetes mellitus with unspecified complications: Secondary | ICD-10-CM

## 2017-09-23 DIAGNOSIS — R21 Rash and other nonspecific skin eruption: Secondary | ICD-10-CM

## 2017-09-23 LAB — POCT GLYCOSYLATED HEMOGLOBIN (HGB A1C): HBA1C, POC (CONTROLLED DIABETIC RANGE): 9.6 % — AB (ref 0.0–7.0)

## 2017-09-23 MED ORDER — PREDNISONE 50 MG PO TABS: 50.0000 mg | ORAL_TABLET | Freq: Every day | ORAL | 0 refills | Status: AC

## 2017-09-23 MED ORDER — GABAPENTIN 100 MG PO CAPS: 100.0000 mg | ORAL_CAPSULE | Freq: Two times a day (BID) | ORAL | 0 refills | Status: DC

## 2017-09-23 NOTE — Patient Instructions (Signed)
It was great to see you!  For your rash,  - We are trying a couple oral medications that will hopefully get you some relief. - We are also referring you to Dermatology to figure out what may be causing your rash.  Take care and seek immediate care sooner if you develop any concerns.   Dr. [facility]

## 2017-09-23 NOTE — Assessment & Plan Note (Addendum)
Unclear etiology with no improvement with topical steroids or antihistamines. Question whether it may be uremic pruritis with hyperkeratotic scarring from chronic excoriation. Will prescribe low dose gabapentin and prednisone burst to aid with itching and refer to Dermatology for closer evaluation and possible biopsy. Advised patient to closely monitor CBGs while on prednisone and increase Lantus 1u for every CBG >150 given recent worsening in A1c. Follow up with PCP.

## 2017-09-23 NOTE — Telephone Encounter (Signed)
Pt lm on nurse line wondering if the doctor was going to call in a cream, she did not remember.   Spoke with Dr. Ky Barban.  Did not intend to send cream, only gabapentin and prednisone.  She is ok with pt trying OTC hydrocortisone cream if she wants.    Attempted to call pt back.  Had to leave message stating "the meds the doctor rx'd were the correct one, please call us back Monday if you have questions." [place], Salome Spotted, CMA

## 2017-09-24 DIAGNOSIS — L299 Pruritus, unspecified: Secondary | ICD-10-CM | POA: Diagnosis not present

## 2017-09-24 DIAGNOSIS — D631 Anemia in chronic kidney disease: Secondary | ICD-10-CM | POA: Diagnosis not present

## 2017-09-24 DIAGNOSIS — E1129 Type 2 diabetes mellitus with other diabetic kidney complication: Secondary | ICD-10-CM | POA: Diagnosis not present

## 2017-09-24 DIAGNOSIS — D688 Other specified coagulation defects: Secondary | ICD-10-CM | POA: Diagnosis not present

## 2017-09-24 DIAGNOSIS — D509 Iron deficiency anemia, unspecified: Secondary | ICD-10-CM | POA: Diagnosis not present

## 2017-09-24 DIAGNOSIS — R51 Headache: Secondary | ICD-10-CM | POA: Diagnosis not present

## 2017-09-24 DIAGNOSIS — N186 End stage renal disease: Secondary | ICD-10-CM | POA: Diagnosis not present

## 2017-09-24 DIAGNOSIS — N2581 Secondary hyperparathyroidism of renal origin: Secondary | ICD-10-CM | POA: Diagnosis not present

## 2017-09-24 DIAGNOSIS — R739 Hyperglycemia, unspecified: Secondary | ICD-10-CM | POA: Diagnosis not present

## 2017-09-27 DIAGNOSIS — L299 Pruritus, unspecified: Secondary | ICD-10-CM | POA: Diagnosis not present

## 2017-09-27 DIAGNOSIS — E1129 Type 2 diabetes mellitus with other diabetic kidney complication: Secondary | ICD-10-CM | POA: Diagnosis not present

## 2017-09-27 DIAGNOSIS — D631 Anemia in chronic kidney disease: Secondary | ICD-10-CM | POA: Diagnosis not present

## 2017-09-27 DIAGNOSIS — R739 Hyperglycemia, unspecified: Secondary | ICD-10-CM | POA: Diagnosis not present

## 2017-09-27 DIAGNOSIS — D509 Iron deficiency anemia, unspecified: Secondary | ICD-10-CM | POA: Diagnosis not present

## 2017-09-27 DIAGNOSIS — N186 End stage renal disease: Secondary | ICD-10-CM | POA: Diagnosis not present

## 2017-09-27 DIAGNOSIS — D688 Other specified coagulation defects: Secondary | ICD-10-CM | POA: Diagnosis not present

## 2017-09-27 DIAGNOSIS — N2581 Secondary hyperparathyroidism of renal origin: Secondary | ICD-10-CM | POA: Diagnosis not present

## 2017-09-27 DIAGNOSIS — R51 Headache: Secondary | ICD-10-CM | POA: Diagnosis not present

## 2017-09-29 DIAGNOSIS — D509 Iron deficiency anemia, unspecified: Secondary | ICD-10-CM | POA: Diagnosis not present

## 2017-09-29 DIAGNOSIS — D688 Other specified coagulation defects: Secondary | ICD-10-CM | POA: Diagnosis not present

## 2017-09-29 DIAGNOSIS — D631 Anemia in chronic kidney disease: Secondary | ICD-10-CM | POA: Diagnosis not present

## 2017-09-29 DIAGNOSIS — L299 Pruritus, unspecified: Secondary | ICD-10-CM | POA: Diagnosis not present

## 2017-09-29 DIAGNOSIS — R51 Headache: Secondary | ICD-10-CM | POA: Diagnosis not present

## 2017-09-29 DIAGNOSIS — N186 End stage renal disease: Secondary | ICD-10-CM | POA: Diagnosis not present

## 2017-09-29 DIAGNOSIS — R739 Hyperglycemia, unspecified: Secondary | ICD-10-CM | POA: Diagnosis not present

## 2017-09-29 DIAGNOSIS — N2581 Secondary hyperparathyroidism of renal origin: Secondary | ICD-10-CM | POA: Diagnosis not present

## 2017-09-29 DIAGNOSIS — E1129 Type 2 diabetes mellitus with other diabetic kidney complication: Secondary | ICD-10-CM | POA: Diagnosis not present

## 2017-10-01 DIAGNOSIS — N186 End stage renal disease: Secondary | ICD-10-CM | POA: Diagnosis not present

## 2017-10-01 DIAGNOSIS — D631 Anemia in chronic kidney disease: Secondary | ICD-10-CM | POA: Diagnosis not present

## 2017-10-01 DIAGNOSIS — L299 Pruritus, unspecified: Secondary | ICD-10-CM | POA: Diagnosis not present

## 2017-10-01 DIAGNOSIS — R51 Headache: Secondary | ICD-10-CM | POA: Diagnosis not present

## 2017-10-01 DIAGNOSIS — E1129 Type 2 diabetes mellitus with other diabetic kidney complication: Secondary | ICD-10-CM | POA: Diagnosis not present

## 2017-10-01 DIAGNOSIS — N2581 Secondary hyperparathyroidism of renal origin: Secondary | ICD-10-CM | POA: Diagnosis not present

## 2017-10-01 DIAGNOSIS — D509 Iron deficiency anemia, unspecified: Secondary | ICD-10-CM | POA: Diagnosis not present

## 2017-10-01 DIAGNOSIS — R739 Hyperglycemia, unspecified: Secondary | ICD-10-CM | POA: Diagnosis not present

## 2017-10-01 DIAGNOSIS — D688 Other specified coagulation defects: Secondary | ICD-10-CM | POA: Diagnosis not present

## 2017-10-04 ENCOUNTER — Encounter: Payer: Self-pay | Admitting: Vascular Surgery

## 2017-10-04 ENCOUNTER — Ambulatory Visit (INDEPENDENT_AMBULATORY_CARE_PROVIDER_SITE_OTHER): Payer: Medicare Other | Admitting: Vascular Surgery

## 2017-10-04 ENCOUNTER — Other Ambulatory Visit: Payer: Self-pay

## 2017-10-04 VITALS — BP 155/70 | HR 83 | Resp 18 | Ht 62.0 in | Wt 228.0 lb

## 2017-10-04 DIAGNOSIS — R51 Headache: Secondary | ICD-10-CM | POA: Diagnosis not present

## 2017-10-04 DIAGNOSIS — N186 End stage renal disease: Secondary | ICD-10-CM | POA: Diagnosis not present

## 2017-10-04 DIAGNOSIS — D631 Anemia in chronic kidney disease: Secondary | ICD-10-CM | POA: Diagnosis not present

## 2017-10-04 DIAGNOSIS — L299 Pruritus, unspecified: Secondary | ICD-10-CM | POA: Diagnosis not present

## 2017-10-04 DIAGNOSIS — R739 Hyperglycemia, unspecified: Secondary | ICD-10-CM | POA: Diagnosis not present

## 2017-10-04 DIAGNOSIS — E1129 Type 2 diabetes mellitus with other diabetic kidney complication: Secondary | ICD-10-CM | POA: Diagnosis not present

## 2017-10-04 DIAGNOSIS — N2581 Secondary hyperparathyroidism of renal origin: Secondary | ICD-10-CM | POA: Diagnosis not present

## 2017-10-04 DIAGNOSIS — D688 Other specified coagulation defects: Secondary | ICD-10-CM | POA: Diagnosis not present

## 2017-10-04 DIAGNOSIS — D509 Iron deficiency anemia, unspecified: Secondary | ICD-10-CM | POA: Diagnosis not present

## 2017-10-04 NOTE — Progress Notes (Signed)
Vascular and Vein Specialist of [facility]  Patient name: Rachel Hobbs MRN: [id number] DOB: 1941/02/19 Sex: female  REASON FOR VISIT: Follow-up left upper arm AV Gore-Tex graft placement  HPI: Rachel Hobbs is a 77 y.o. female here today for follow-up.  She had placement of a left upper arm AV Gore-Tex graft by myself on 05/23/2017.  She has had very good use of the graft with no issues with flow.  She saw Dr. Geryl Councilman 1 month ago with some concern regarding mild steal.  She had a second finger paronychia as well.  The paronychia has now completely healed.  She does report that she occasionally feels some coolness in her hand.  It specifically does not wake her up at night and does not become worse on hemodialysis.  She has noted no weakness in her left hand.  She reports that this is mild concern for her.  Past Medical History:  Diagnosis Date  . Arthritis   . CHF (congestive heart failure) ([place])   . Depression   . Diabetes mellitus    type 2  . Hernia   . Hyperlipidemia   . Hypertension   . Iron deficiency anemia   . Low iron   . Peripheral vascular disease (HCC)    in legs  . Pneumonia    teenager  . Renal disorder    CKD - dialysis T/TH/Sa  . Shortness of breath dyspnea    with exertion    Family History  Problem Relation Age of Onset  . Kidney disease Mother   . Hypertension Mother   . COPD Father        smoke  . Cancer Father        Lung  . Hypertension Father   . Kidney disease Sister   . Diabetes Daughter     SOCIAL HISTORY: Social History   Tobacco Use  . Smoking status: Never Smoker  . Smokeless tobacco: Never Used  Substance Use Topics  . Alcohol use: No    No Known Allergies  Current Outpatient Medications  Medication Sig Dispense Refill  . acetaminophen (TYLENOL) 325 MG tablet Take 650 mg by mouth daily as needed for moderate pain or headache.    Marland Kitchen aspirin 81 MG tablet Take 81 mg by mouth daily.      Marland Kitchen  atorvastatin (LIPITOR) 40 MG tablet TAKE 1 TABLET BY MOUTH AT NIGHT FOR CHOLESTEROL 90 tablet 0  . B Complex-C-Folic Acid (NEPHRO VITAMINS) 0.8 MG TABS Take 1 tablet by mouth daily.    . bisacodyl (BISACODYL) 5 MG EC tablet Take 5 mg by mouth daily as needed for moderate constipation.    . calcium acetate (PHOSLO) 667 MG capsule Take 667-1,334 mg by mouth See admin instructions. Take 1334 mg with each meal and 667 mg with each snack    . cetirizine (ZYRTEC) 5 MG tablet Take 1 tablet (5 mg total) by mouth daily. 30 tablet 0  . clobetasol ointment (TEMOVATE) 8.67 % Apply 1 application topically 2 (two) times daily. For very severe eczema.  Do not use for more than 1 week at a time. 60 g 0  . ethyl chloride spray APP TO ACCESS PRE-DIALYSIS UTD  0  . gabapentin (NEURONTIN) 100 MG capsule Take 1 capsule (100 mg total) by mouth 2 (two) times daily for 15 days. 30 capsule 0  . hydrOXYzine (ATARAX/VISTARIL) 10 MG tablet Take 1 tablet (10 mg total) by mouth 3 (three) times daily as needed. [place]  tablet 0  . isosorbide mononitrate (IMDUR) 30 MG 24 hr tablet Take 30 mg by mouth every evening.     . Lancets (ONETOUCH ULTRASOFT) lancets Once daily testing plus prn for hypoglycemia 100 each 9  . LANTUS SOLOSTAR 100 UNIT/ML Solostar Pen INJECT 30 UNITS UNDER THE SKIN DAILY AT 10 PM (Patient taking differently: INJECT 30-35 UNITS UNDER THE SKIN DAILY AT SUPPER) 15 mL 0  . oxyCODONE-acetaminophen (PERCOCET) 5-325 MG tablet Take 1 tablet by mouth every 6 (six) hours as needed for severe pain. 10 tablet 0  . Skin Protectants, Misc. (EUCERIN) cream Apply topically as needed for dry skin. 454 g 0  . triamcinolone ointment (KENALOG) 0.5 % Apply 1 application topically 2 (two) times daily. 30 g 0  . Blood Glucose Monitoring Suppl (BLOOD GLUCOSE METER) kit Use as instructed (Patient not taking: Reported on 10/04/2017) 1 each 0  . cephALEXin (KEFLEX) 500 MG capsule Take 1 capsule (500 mg total) by mouth 3 (three) times daily.  (Patient not taking: Reported on 10/04/2017) 21 capsule 0  . Insulin Pen Needle 31G X 8 MM MISC BD UltraFine III Pen Needles. For use with insulin pen device. Inject insulin 6 x daily (Patient not taking: Reported on 10/04/2017) 100 each 3  . NEEDLE, DISP, 30 G (B-D DISP NEEDLE 30GX1") 30G X 1" MISC 1 each by Does not apply route daily. (Patient not taking: Reported on 10/04/2017) 100 each 2  . ONE TOUCH ULTRA TEST test strip TEST BLOOD SUGAR EVERY DAY AND AS NEEDED FOR SYMPTOMS OF HYPOGLYCEMIA (Patient not taking: Reported on 10/04/2017) 100 each 0   No current facility-administered medications for this visit.     REVIEW OF SYSTEMS:  _0  denotes positive finding, _1  denotes negative finding Cardiac  Comments:  Chest pain or chest pressure:    Shortness of breath upon exertion:    Short of breath when lying flat:    Irregular heart rhythm:        Vascular    Pain in calf, thigh, or hip brought on by ambulation:    Pain in feet at night that wakes you up from your sleep:     Blood clot in your veins:    Leg swelling:           PHYSICAL EXAM: Vitals:   10/04/17 0828 10/04/17 0829  BP: (!) 151/71 (!) 155/70  Pulse: 83   Resp: 18   SpO2: 99%   Weight: 228 lb (103.4 kg)   Height: _2  (1.575 m)     GENERAL: The patient is a well-nourished female, in no acute distress. The vital signs are documented above. CARDIOVASCULAR: Thrill in her left upper arm graft.  Her hand is equally warm right and left.  I do not feel a radial pulse with compression of the graft or without PULMONARY: There is good air exchange  MUSCULOSKELETAL: There are no major deformities or cyanosis. NEUROLOGIC: No focal weakness or paresthesias are detected. SKIN: There are no ulcers or rashes noted. PSYCHIATRIC: The patient has a normal affect.  DATA:  None  MEDICAL ISSUES: Patient did have a tapered graft.  I explained that there really is no other option for steal other than ligation.  She reports that this  is very mild discomfort to her and the paronychia has completely healed with no evidence of tissue loss.  I explained the only option would be ligation and right arm access which I would certainly avoid if possible since she is  right arm dominant.  She is comfortable with this discussion.  May be having mild steal symptoms but this is tolerable for her.  She will see [place] again on an as-needed basis    [name], MD [facility] Vascular and Vein Specialists of [facility] Tel [phone] Pager [phone]

## 2017-10-06 DIAGNOSIS — E1129 Type 2 diabetes mellitus with other diabetic kidney complication: Secondary | ICD-10-CM | POA: Diagnosis not present

## 2017-10-06 DIAGNOSIS — N186 End stage renal disease: Secondary | ICD-10-CM | POA: Diagnosis not present

## 2017-10-06 DIAGNOSIS — D631 Anemia in chronic kidney disease: Secondary | ICD-10-CM | POA: Diagnosis not present

## 2017-10-06 DIAGNOSIS — D509 Iron deficiency anemia, unspecified: Secondary | ICD-10-CM | POA: Diagnosis not present

## 2017-10-06 DIAGNOSIS — R51 Headache: Secondary | ICD-10-CM | POA: Diagnosis not present

## 2017-10-06 DIAGNOSIS — D688 Other specified coagulation defects: Secondary | ICD-10-CM | POA: Diagnosis not present

## 2017-10-06 DIAGNOSIS — R739 Hyperglycemia, unspecified: Secondary | ICD-10-CM | POA: Diagnosis not present

## 2017-10-06 DIAGNOSIS — N2581 Secondary hyperparathyroidism of renal origin: Secondary | ICD-10-CM | POA: Diagnosis not present

## 2017-10-06 DIAGNOSIS — L299 Pruritus, unspecified: Secondary | ICD-10-CM | POA: Diagnosis not present

## 2017-10-08 DIAGNOSIS — N186 End stage renal disease: Secondary | ICD-10-CM | POA: Diagnosis not present

## 2017-10-08 DIAGNOSIS — D631 Anemia in chronic kidney disease: Secondary | ICD-10-CM | POA: Diagnosis not present

## 2017-10-08 DIAGNOSIS — N2581 Secondary hyperparathyroidism of renal origin: Secondary | ICD-10-CM | POA: Diagnosis not present

## 2017-10-08 DIAGNOSIS — L299 Pruritus, unspecified: Secondary | ICD-10-CM | POA: Diagnosis not present

## 2017-10-08 DIAGNOSIS — D509 Iron deficiency anemia, unspecified: Secondary | ICD-10-CM | POA: Diagnosis not present

## 2017-10-08 DIAGNOSIS — R51 Headache: Secondary | ICD-10-CM | POA: Diagnosis not present

## 2017-10-08 DIAGNOSIS — R739 Hyperglycemia, unspecified: Secondary | ICD-10-CM | POA: Diagnosis not present

## 2017-10-08 DIAGNOSIS — D688 Other specified coagulation defects: Secondary | ICD-10-CM | POA: Diagnosis not present

## 2017-10-08 DIAGNOSIS — E1129 Type 2 diabetes mellitus with other diabetic kidney complication: Secondary | ICD-10-CM | POA: Diagnosis not present

## 2017-10-11 DIAGNOSIS — D688 Other specified coagulation defects: Secondary | ICD-10-CM | POA: Diagnosis not present

## 2017-10-11 DIAGNOSIS — E1129 Type 2 diabetes mellitus with other diabetic kidney complication: Secondary | ICD-10-CM | POA: Diagnosis not present

## 2017-10-11 DIAGNOSIS — D631 Anemia in chronic kidney disease: Secondary | ICD-10-CM | POA: Diagnosis not present

## 2017-10-11 DIAGNOSIS — L299 Pruritus, unspecified: Secondary | ICD-10-CM | POA: Diagnosis not present

## 2017-10-11 DIAGNOSIS — N186 End stage renal disease: Secondary | ICD-10-CM | POA: Diagnosis not present

## 2017-10-11 DIAGNOSIS — D509 Iron deficiency anemia, unspecified: Secondary | ICD-10-CM | POA: Diagnosis not present

## 2017-10-11 DIAGNOSIS — R51 Headache: Secondary | ICD-10-CM | POA: Diagnosis not present

## 2017-10-11 DIAGNOSIS — R739 Hyperglycemia, unspecified: Secondary | ICD-10-CM | POA: Diagnosis not present

## 2017-10-11 DIAGNOSIS — N2581 Secondary hyperparathyroidism of renal origin: Secondary | ICD-10-CM | POA: Diagnosis not present

## 2017-10-13 DIAGNOSIS — R51 Headache: Secondary | ICD-10-CM | POA: Diagnosis not present

## 2017-10-13 DIAGNOSIS — N2581 Secondary hyperparathyroidism of renal origin: Secondary | ICD-10-CM | POA: Diagnosis not present

## 2017-10-13 DIAGNOSIS — L299 Pruritus, unspecified: Secondary | ICD-10-CM | POA: Diagnosis not present

## 2017-10-13 DIAGNOSIS — R739 Hyperglycemia, unspecified: Secondary | ICD-10-CM | POA: Diagnosis not present

## 2017-10-13 DIAGNOSIS — D688 Other specified coagulation defects: Secondary | ICD-10-CM | POA: Diagnosis not present

## 2017-10-13 DIAGNOSIS — N186 End stage renal disease: Secondary | ICD-10-CM | POA: Diagnosis not present

## 2017-10-13 DIAGNOSIS — D631 Anemia in chronic kidney disease: Secondary | ICD-10-CM | POA: Diagnosis not present

## 2017-10-13 DIAGNOSIS — D509 Iron deficiency anemia, unspecified: Secondary | ICD-10-CM | POA: Diagnosis not present

## 2017-10-13 DIAGNOSIS — E1129 Type 2 diabetes mellitus with other diabetic kidney complication: Secondary | ICD-10-CM | POA: Diagnosis not present

## 2017-10-14 DIAGNOSIS — D509 Iron deficiency anemia, unspecified: Secondary | ICD-10-CM | POA: Diagnosis not present

## 2017-10-14 DIAGNOSIS — N2581 Secondary hyperparathyroidism of renal origin: Secondary | ICD-10-CM | POA: Diagnosis not present

## 2017-10-14 DIAGNOSIS — R739 Hyperglycemia, unspecified: Secondary | ICD-10-CM | POA: Diagnosis not present

## 2017-10-14 DIAGNOSIS — R51 Headache: Secondary | ICD-10-CM | POA: Diagnosis not present

## 2017-10-14 DIAGNOSIS — E1129 Type 2 diabetes mellitus with other diabetic kidney complication: Secondary | ICD-10-CM | POA: Diagnosis not present

## 2017-10-14 DIAGNOSIS — D631 Anemia in chronic kidney disease: Secondary | ICD-10-CM | POA: Diagnosis not present

## 2017-10-14 DIAGNOSIS — L299 Pruritus, unspecified: Secondary | ICD-10-CM | POA: Diagnosis not present

## 2017-10-14 DIAGNOSIS — N186 End stage renal disease: Secondary | ICD-10-CM | POA: Diagnosis not present

## 2017-10-14 DIAGNOSIS — D688 Other specified coagulation defects: Secondary | ICD-10-CM | POA: Diagnosis not present

## 2017-10-16 DIAGNOSIS — E1129 Type 2 diabetes mellitus with other diabetic kidney complication: Secondary | ICD-10-CM | POA: Diagnosis not present

## 2017-10-16 DIAGNOSIS — N186 End stage renal disease: Secondary | ICD-10-CM | POA: Diagnosis not present

## 2017-10-16 DIAGNOSIS — Z992 Dependence on renal dialysis: Secondary | ICD-10-CM | POA: Diagnosis not present

## 2017-10-18 DIAGNOSIS — N2581 Secondary hyperparathyroidism of renal origin: Secondary | ICD-10-CM | POA: Diagnosis not present

## 2017-10-18 DIAGNOSIS — D631 Anemia in chronic kidney disease: Secondary | ICD-10-CM | POA: Diagnosis not present

## 2017-10-18 DIAGNOSIS — D509 Iron deficiency anemia, unspecified: Secondary | ICD-10-CM | POA: Diagnosis not present

## 2017-10-18 DIAGNOSIS — L299 Pruritus, unspecified: Secondary | ICD-10-CM | POA: Diagnosis not present

## 2017-10-18 DIAGNOSIS — R51 Headache: Secondary | ICD-10-CM | POA: Diagnosis not present

## 2017-10-18 DIAGNOSIS — E1129 Type 2 diabetes mellitus with other diabetic kidney complication: Secondary | ICD-10-CM | POA: Diagnosis not present

## 2017-10-18 DIAGNOSIS — D688 Other specified coagulation defects: Secondary | ICD-10-CM | POA: Diagnosis not present

## 2017-10-18 DIAGNOSIS — N186 End stage renal disease: Secondary | ICD-10-CM | POA: Diagnosis not present

## 2017-10-20 DIAGNOSIS — N2581 Secondary hyperparathyroidism of renal origin: Secondary | ICD-10-CM | POA: Diagnosis not present

## 2017-10-20 DIAGNOSIS — E1129 Type 2 diabetes mellitus with other diabetic kidney complication: Secondary | ICD-10-CM | POA: Diagnosis not present

## 2017-10-20 DIAGNOSIS — D631 Anemia in chronic kidney disease: Secondary | ICD-10-CM | POA: Diagnosis not present

## 2017-10-20 DIAGNOSIS — R51 Headache: Secondary | ICD-10-CM | POA: Diagnosis not present

## 2017-10-20 DIAGNOSIS — N186 End stage renal disease: Secondary | ICD-10-CM | POA: Diagnosis not present

## 2017-10-20 DIAGNOSIS — D509 Iron deficiency anemia, unspecified: Secondary | ICD-10-CM | POA: Diagnosis not present

## 2017-10-20 DIAGNOSIS — L299 Pruritus, unspecified: Secondary | ICD-10-CM | POA: Diagnosis not present

## 2017-10-20 DIAGNOSIS — D688 Other specified coagulation defects: Secondary | ICD-10-CM | POA: Diagnosis not present

## 2017-10-22 DIAGNOSIS — R51 Headache: Secondary | ICD-10-CM | POA: Diagnosis not present

## 2017-10-22 DIAGNOSIS — D509 Iron deficiency anemia, unspecified: Secondary | ICD-10-CM | POA: Diagnosis not present

## 2017-10-22 DIAGNOSIS — D688 Other specified coagulation defects: Secondary | ICD-10-CM | POA: Diagnosis not present

## 2017-10-22 DIAGNOSIS — N2581 Secondary hyperparathyroidism of renal origin: Secondary | ICD-10-CM | POA: Diagnosis not present

## 2017-10-22 DIAGNOSIS — N186 End stage renal disease: Secondary | ICD-10-CM | POA: Diagnosis not present

## 2017-10-22 DIAGNOSIS — D631 Anemia in chronic kidney disease: Secondary | ICD-10-CM | POA: Diagnosis not present

## 2017-10-22 DIAGNOSIS — L299 Pruritus, unspecified: Secondary | ICD-10-CM | POA: Diagnosis not present

## 2017-10-22 DIAGNOSIS — E1129 Type 2 diabetes mellitus with other diabetic kidney complication: Secondary | ICD-10-CM | POA: Diagnosis not present

## 2017-10-24 DIAGNOSIS — B86 Scabies: Secondary | ICD-10-CM | POA: Diagnosis not present

## 2017-10-25 DIAGNOSIS — D688 Other specified coagulation defects: Secondary | ICD-10-CM | POA: Diagnosis not present

## 2017-10-25 DIAGNOSIS — R51 Headache: Secondary | ICD-10-CM | POA: Diagnosis not present

## 2017-10-25 DIAGNOSIS — D509 Iron deficiency anemia, unspecified: Secondary | ICD-10-CM | POA: Diagnosis not present

## 2017-10-25 DIAGNOSIS — N2581 Secondary hyperparathyroidism of renal origin: Secondary | ICD-10-CM | POA: Diagnosis not present

## 2017-10-25 DIAGNOSIS — E1129 Type 2 diabetes mellitus with other diabetic kidney complication: Secondary | ICD-10-CM | POA: Diagnosis not present

## 2017-10-25 DIAGNOSIS — N186 End stage renal disease: Secondary | ICD-10-CM | POA: Diagnosis not present

## 2017-10-25 DIAGNOSIS — L299 Pruritus, unspecified: Secondary | ICD-10-CM | POA: Diagnosis not present

## 2017-10-25 DIAGNOSIS — D631 Anemia in chronic kidney disease: Secondary | ICD-10-CM | POA: Diagnosis not present

## 2017-10-27 DIAGNOSIS — E1129 Type 2 diabetes mellitus with other diabetic kidney complication: Secondary | ICD-10-CM | POA: Diagnosis not present

## 2017-10-27 DIAGNOSIS — D631 Anemia in chronic kidney disease: Secondary | ICD-10-CM | POA: Diagnosis not present

## 2017-10-27 DIAGNOSIS — D688 Other specified coagulation defects: Secondary | ICD-10-CM | POA: Diagnosis not present

## 2017-10-27 DIAGNOSIS — N186 End stage renal disease: Secondary | ICD-10-CM | POA: Diagnosis not present

## 2017-10-27 DIAGNOSIS — L299 Pruritus, unspecified: Secondary | ICD-10-CM | POA: Diagnosis not present

## 2017-10-27 DIAGNOSIS — N2581 Secondary hyperparathyroidism of renal origin: Secondary | ICD-10-CM | POA: Diagnosis not present

## 2017-10-27 DIAGNOSIS — D509 Iron deficiency anemia, unspecified: Secondary | ICD-10-CM | POA: Diagnosis not present

## 2017-10-27 DIAGNOSIS — R51 Headache: Secondary | ICD-10-CM | POA: Diagnosis not present

## 2017-10-29 DIAGNOSIS — D509 Iron deficiency anemia, unspecified: Secondary | ICD-10-CM | POA: Diagnosis not present

## 2017-10-29 DIAGNOSIS — N186 End stage renal disease: Secondary | ICD-10-CM | POA: Diagnosis not present

## 2017-10-29 DIAGNOSIS — E1129 Type 2 diabetes mellitus with other diabetic kidney complication: Secondary | ICD-10-CM | POA: Diagnosis not present

## 2017-10-29 DIAGNOSIS — D631 Anemia in chronic kidney disease: Secondary | ICD-10-CM | POA: Diagnosis not present

## 2017-10-29 DIAGNOSIS — R51 Headache: Secondary | ICD-10-CM | POA: Diagnosis not present

## 2017-10-29 DIAGNOSIS — N2581 Secondary hyperparathyroidism of renal origin: Secondary | ICD-10-CM | POA: Diagnosis not present

## 2017-10-29 DIAGNOSIS — L299 Pruritus, unspecified: Secondary | ICD-10-CM | POA: Diagnosis not present

## 2017-10-29 DIAGNOSIS — D688 Other specified coagulation defects: Secondary | ICD-10-CM | POA: Diagnosis not present

## 2017-11-01 DIAGNOSIS — R51 Headache: Secondary | ICD-10-CM | POA: Diagnosis not present

## 2017-11-01 DIAGNOSIS — D509 Iron deficiency anemia, unspecified: Secondary | ICD-10-CM | POA: Diagnosis not present

## 2017-11-01 DIAGNOSIS — L299 Pruritus, unspecified: Secondary | ICD-10-CM | POA: Diagnosis not present

## 2017-11-01 DIAGNOSIS — N186 End stage renal disease: Secondary | ICD-10-CM | POA: Diagnosis not present

## 2017-11-01 DIAGNOSIS — N2581 Secondary hyperparathyroidism of renal origin: Secondary | ICD-10-CM | POA: Diagnosis not present

## 2017-11-01 DIAGNOSIS — D688 Other specified coagulation defects: Secondary | ICD-10-CM | POA: Diagnosis not present

## 2017-11-01 DIAGNOSIS — E1129 Type 2 diabetes mellitus with other diabetic kidney complication: Secondary | ICD-10-CM | POA: Diagnosis not present

## 2017-11-01 DIAGNOSIS — D631 Anemia in chronic kidney disease: Secondary | ICD-10-CM | POA: Diagnosis not present

## 2017-11-03 DIAGNOSIS — D688 Other specified coagulation defects: Secondary | ICD-10-CM | POA: Diagnosis not present

## 2017-11-03 DIAGNOSIS — L299 Pruritus, unspecified: Secondary | ICD-10-CM | POA: Diagnosis not present

## 2017-11-03 DIAGNOSIS — D509 Iron deficiency anemia, unspecified: Secondary | ICD-10-CM | POA: Diagnosis not present

## 2017-11-03 DIAGNOSIS — E1129 Type 2 diabetes mellitus with other diabetic kidney complication: Secondary | ICD-10-CM | POA: Diagnosis not present

## 2017-11-03 DIAGNOSIS — N2581 Secondary hyperparathyroidism of renal origin: Secondary | ICD-10-CM | POA: Diagnosis not present

## 2017-11-03 DIAGNOSIS — R51 Headache: Secondary | ICD-10-CM | POA: Diagnosis not present

## 2017-11-03 DIAGNOSIS — N186 End stage renal disease: Secondary | ICD-10-CM | POA: Diagnosis not present

## 2017-11-03 DIAGNOSIS — D631 Anemia in chronic kidney disease: Secondary | ICD-10-CM | POA: Diagnosis not present

## 2017-11-05 DIAGNOSIS — N186 End stage renal disease: Secondary | ICD-10-CM | POA: Diagnosis not present

## 2017-11-05 DIAGNOSIS — D688 Other specified coagulation defects: Secondary | ICD-10-CM | POA: Diagnosis not present

## 2017-11-05 DIAGNOSIS — N2581 Secondary hyperparathyroidism of renal origin: Secondary | ICD-10-CM | POA: Diagnosis not present

## 2017-11-05 DIAGNOSIS — L299 Pruritus, unspecified: Secondary | ICD-10-CM | POA: Diagnosis not present

## 2017-11-05 DIAGNOSIS — E1129 Type 2 diabetes mellitus with other diabetic kidney complication: Secondary | ICD-10-CM | POA: Diagnosis not present

## 2017-11-05 DIAGNOSIS — D631 Anemia in chronic kidney disease: Secondary | ICD-10-CM | POA: Diagnosis not present

## 2017-11-05 DIAGNOSIS — D509 Iron deficiency anemia, unspecified: Secondary | ICD-10-CM | POA: Diagnosis not present

## 2017-11-05 DIAGNOSIS — R51 Headache: Secondary | ICD-10-CM | POA: Diagnosis not present

## 2017-11-08 DIAGNOSIS — E1129 Type 2 diabetes mellitus with other diabetic kidney complication: Secondary | ICD-10-CM | POA: Diagnosis not present

## 2017-11-08 DIAGNOSIS — D688 Other specified coagulation defects: Secondary | ICD-10-CM | POA: Diagnosis not present

## 2017-11-08 DIAGNOSIS — D631 Anemia in chronic kidney disease: Secondary | ICD-10-CM | POA: Diagnosis not present

## 2017-11-08 DIAGNOSIS — L299 Pruritus, unspecified: Secondary | ICD-10-CM | POA: Diagnosis not present

## 2017-11-08 DIAGNOSIS — R51 Headache: Secondary | ICD-10-CM | POA: Diagnosis not present

## 2017-11-08 DIAGNOSIS — D509 Iron deficiency anemia, unspecified: Secondary | ICD-10-CM | POA: Diagnosis not present

## 2017-11-08 DIAGNOSIS — N186 End stage renal disease: Secondary | ICD-10-CM | POA: Diagnosis not present

## 2017-11-08 DIAGNOSIS — N2581 Secondary hyperparathyroidism of renal origin: Secondary | ICD-10-CM | POA: Diagnosis not present

## 2017-11-09 ENCOUNTER — Other Ambulatory Visit: Payer: Self-pay | Admitting: Vascular Surgery

## 2017-11-10 DIAGNOSIS — N2581 Secondary hyperparathyroidism of renal origin: Secondary | ICD-10-CM | POA: Diagnosis not present

## 2017-11-10 DIAGNOSIS — E1129 Type 2 diabetes mellitus with other diabetic kidney complication: Secondary | ICD-10-CM | POA: Diagnosis not present

## 2017-11-10 DIAGNOSIS — D631 Anemia in chronic kidney disease: Secondary | ICD-10-CM | POA: Diagnosis not present

## 2017-11-10 DIAGNOSIS — D688 Other specified coagulation defects: Secondary | ICD-10-CM | POA: Diagnosis not present

## 2017-11-10 DIAGNOSIS — D509 Iron deficiency anemia, unspecified: Secondary | ICD-10-CM | POA: Diagnosis not present

## 2017-11-10 DIAGNOSIS — R51 Headache: Secondary | ICD-10-CM | POA: Diagnosis not present

## 2017-11-10 DIAGNOSIS — L299 Pruritus, unspecified: Secondary | ICD-10-CM | POA: Diagnosis not present

## 2017-11-10 DIAGNOSIS — N186 End stage renal disease: Secondary | ICD-10-CM | POA: Diagnosis not present

## 2017-11-12 DIAGNOSIS — D688 Other specified coagulation defects: Secondary | ICD-10-CM | POA: Diagnosis not present

## 2017-11-12 DIAGNOSIS — E1129 Type 2 diabetes mellitus with other diabetic kidney complication: Secondary | ICD-10-CM | POA: Diagnosis not present

## 2017-11-12 DIAGNOSIS — D509 Iron deficiency anemia, unspecified: Secondary | ICD-10-CM | POA: Diagnosis not present

## 2017-11-12 DIAGNOSIS — N186 End stage renal disease: Secondary | ICD-10-CM | POA: Diagnosis not present

## 2017-11-12 DIAGNOSIS — L299 Pruritus, unspecified: Secondary | ICD-10-CM | POA: Diagnosis not present

## 2017-11-12 DIAGNOSIS — N2581 Secondary hyperparathyroidism of renal origin: Secondary | ICD-10-CM | POA: Diagnosis not present

## 2017-11-12 DIAGNOSIS — D631 Anemia in chronic kidney disease: Secondary | ICD-10-CM | POA: Diagnosis not present

## 2017-11-12 DIAGNOSIS — R51 Headache: Secondary | ICD-10-CM | POA: Diagnosis not present

## 2017-11-15 DIAGNOSIS — N186 End stage renal disease: Secondary | ICD-10-CM | POA: Diagnosis not present

## 2017-11-15 DIAGNOSIS — D509 Iron deficiency anemia, unspecified: Secondary | ICD-10-CM | POA: Diagnosis not present

## 2017-11-15 DIAGNOSIS — N2581 Secondary hyperparathyroidism of renal origin: Secondary | ICD-10-CM | POA: Diagnosis not present

## 2017-11-15 DIAGNOSIS — D631 Anemia in chronic kidney disease: Secondary | ICD-10-CM | POA: Diagnosis not present

## 2017-11-15 DIAGNOSIS — E1129 Type 2 diabetes mellitus with other diabetic kidney complication: Secondary | ICD-10-CM | POA: Diagnosis not present

## 2017-11-15 DIAGNOSIS — L299 Pruritus, unspecified: Secondary | ICD-10-CM | POA: Diagnosis not present

## 2017-11-15 DIAGNOSIS — R51 Headache: Secondary | ICD-10-CM | POA: Diagnosis not present

## 2017-11-15 DIAGNOSIS — D688 Other specified coagulation defects: Secondary | ICD-10-CM | POA: Diagnosis not present

## 2017-11-16 DIAGNOSIS — N186 End stage renal disease: Secondary | ICD-10-CM | POA: Diagnosis not present

## 2017-11-16 DIAGNOSIS — Z992 Dependence on renal dialysis: Secondary | ICD-10-CM | POA: Diagnosis not present

## 2017-11-16 DIAGNOSIS — E1129 Type 2 diabetes mellitus with other diabetic kidney complication: Secondary | ICD-10-CM | POA: Diagnosis not present

## 2017-11-17 DIAGNOSIS — E1129 Type 2 diabetes mellitus with other diabetic kidney complication: Secondary | ICD-10-CM | POA: Diagnosis not present

## 2017-11-17 DIAGNOSIS — N186 End stage renal disease: Secondary | ICD-10-CM | POA: Diagnosis not present

## 2017-11-17 DIAGNOSIS — R51 Headache: Secondary | ICD-10-CM | POA: Diagnosis not present

## 2017-11-17 DIAGNOSIS — D631 Anemia in chronic kidney disease: Secondary | ICD-10-CM | POA: Diagnosis not present

## 2017-11-17 DIAGNOSIS — D509 Iron deficiency anemia, unspecified: Secondary | ICD-10-CM | POA: Diagnosis not present

## 2017-11-17 DIAGNOSIS — N2581 Secondary hyperparathyroidism of renal origin: Secondary | ICD-10-CM | POA: Diagnosis not present

## 2017-11-17 DIAGNOSIS — D688 Other specified coagulation defects: Secondary | ICD-10-CM | POA: Diagnosis not present

## 2017-11-18 ENCOUNTER — Ambulatory Visit (INDEPENDENT_AMBULATORY_CARE_PROVIDER_SITE_OTHER): Payer: Medicare Other | Admitting: Physician Assistant

## 2017-11-18 ENCOUNTER — Other Ambulatory Visit: Payer: Self-pay

## 2017-11-18 ENCOUNTER — Telehealth: Payer: Self-pay | Admitting: *Deleted

## 2017-11-18 VITALS — BP 129/64 | HR 74 | Temp 97.9°F | Resp 16 | Ht 62.0 in | Wt 227.0 lb

## 2017-11-18 DIAGNOSIS — M25532 Pain in left wrist: Secondary | ICD-10-CM | POA: Diagnosis not present

## 2017-11-18 DIAGNOSIS — N186 End stage renal disease: Secondary | ICD-10-CM

## 2017-11-18 NOTE — Telephone Encounter (Signed)
Patient called and left 3 messages she needed to be seen today due to "severe left wrist pain". Left mesg. X 2 for patient to call back to speak with this nurse to get in today.

## 2017-11-18 NOTE — Progress Notes (Signed)
History of Present Illness:  Patient is a 77 y.o. year old female who presents for examination of her "severe left wrist pain" .  The pain started on Monday 11/14/2017.   She denise trauma or change with HD in the intensity.  She does state since she took Ibuprofen it has gotten better since yesterday.   She denise fever, chills, numbness and loss of motor.  No recent history of non healing left UE wounds. s/p left upper arm arteriovenous graft with Dr. Early(Date: 05/20/17).   She previously had basilic vein fistula which was completed in April 2018.  She ended up having a fistulogram and stenting in August and since that time she has been complaining of numbness in her forearm.  Her fistula has gone on to occlude.       Past Medical History:  Diagnosis Date  . Arthritis   . CHF (congestive heart failure) ([place])   . Depression   . Diabetes mellitus    type 2  . Hernia   . Hyperlipidemia   . Hypertension   . Iron deficiency anemia   . Low iron   . Peripheral vascular disease (HCC)    in legs  . Pneumonia    teenager  . Renal disorder    CKD - dialysis T/TH/Sa  . Shortness of breath dyspnea    with exertion    Past Surgical History:  Procedure Laterality Date  . ABDOMINAL HYSTERECTOMY     in the 70's  . AV FISTULA PLACEMENT Left 12/05/2015   Procedure: RADIOCEPHALIC VERSUS BRACHIOCEPHALIC ARTERIOVENOUS (AV) FISTULA CREATION;  Surgeon: [name], MD;  Location: [facility] OR;  Service: Vascular;  Laterality: Left;  . AV FISTULA PLACEMENT Left 05/20/2017   Procedure: INSERTION OF ARTERIOVENOUS (AV) GORE-TEX VASCULAR STRETCH 4-7 GRAFT ARM LEFT UPPER ARM;  Surgeon: [name], MD;  Location: [facility];  Service: Vascular;  Laterality: Left;  . BASCILIC VEIN TRANSPOSITION Left 06/04/2016   Procedure: FIRST STAGE BASILIC VEIN TRANSPOSITION;  Surgeon: [name], MD;  Location: [facility];  Service: Vascular;  Laterality: Left;  . BASCILIC VEIN TRANSPOSITION Left 08/04/2016   Procedure: LEFT UPPER ARM [place], SECOND STAGE;  Surgeon: [name], MD;  Location: [facility];  Service: Vascular;  Laterality: Left;  . HERNIA REPAIR     umbilical in the 01'V  . INSERTION OF DIALYSIS CATHETER Right 12/05/2015   Procedure: INSERTION OF DIALYSIS CATHETER;  Surgeon: [name], MD;  Location: [facility] OR;  Service: Vascular;  Laterality: Right;     Social History Social History   Tobacco Use  . Smoking status: Never Smoker  . Smokeless tobacco: Never Used  Substance Use Topics  . Alcohol use: No  . Drug use: No    Family History Family History  Problem Relation Age of Onset  . Kidney disease Mother   . Hypertension Mother   . COPD Father        smoke  . Cancer Father        Lung  . Hypertension Father   . Kidney disease Sister   . Diabetes Daughter     Allergies  No Known Allergies   Current Outpatient Medications  Medication Sig Dispense Refill  . acetaminophen (TYLENOL) 325 MG tablet Take 650 mg by mouth daily as needed for moderate pain or headache.    Marland Kitchen aspirin 81 MG tablet Take 81 mg by mouth daily.      Marland Kitchen atorvastatin (LIPITOR)  40 MG tablet TAKE 1 TABLET BY MOUTH AT NIGHT FOR CHOLESTEROL 90 tablet 0  . B Complex-C-Folic Acid (NEPHRO VITAMINS) 0.8 MG TABS Take 1 tablet by mouth daily.    . bisacodyl (BISACODYL) 5 MG EC tablet Take 5 mg by mouth daily as needed for moderate constipation.    . Blood Glucose Monitoring Suppl (BLOOD GLUCOSE METER) kit Use as instructed (Patient not taking: Reported on 10/04/2017) 1 each 0  . calcium acetate (PHOSLO) 667 MG capsule Take 667-1,334 mg by mouth See admin instructions. Take 1334 mg with each meal and 667 mg with each snack    . cephALEXin (KEFLEX) 500 MG capsule Take 1 capsule (500 mg total) by mouth 3 (three) times daily. (Patient not taking: Reported on 10/04/2017) 21 capsule 0  . cetirizine (ZYRTEC) 5 MG tablet Take 1 tablet (5 mg total) by mouth daily. 30 tablet 0  . clobetasol  ointment (TEMOVATE) 6.26 % Apply 1 application topically 2 (two) times daily. For very severe eczema.  Do not use for more than 1 week at a time. 60 g 0  . ethyl chloride spray APP TO ACCESS PRE-DIALYSIS UTD  0  . gabapentin (NEURONTIN) 100 MG capsule Take 1 capsule (100 mg total) by mouth 2 (two) times daily for 15 days. 30 capsule 0  . hydrOXYzine (ATARAX/VISTARIL) 10 MG tablet Take 1 tablet (10 mg total) by mouth 3 (three) times daily as needed. 30 tablet 0  . Insulin Pen Needle 31G X 8 MM MISC BD UltraFine III Pen Needles. For use with insulin pen device. Inject insulin 6 x daily (Patient not taking: Reported on 10/04/2017) 100 each 3  . isosorbide mononitrate (IMDUR) 30 MG 24 hr tablet Take 30 mg by mouth every evening.     . Lancets (ONETOUCH ULTRASOFT) lancets Once daily testing plus prn for hypoglycemia 100 each 9  . LANTUS SOLOSTAR 100 UNIT/ML Solostar Pen INJECT 30 UNITS UNDER THE SKIN DAILY AT 10 PM (Patient taking differently: INJECT 30-35 UNITS UNDER THE SKIN DAILY AT SUPPER) 15 mL 0  . NEEDLE, DISP, 30 G (B-D DISP NEEDLE 30GX1") 30G X 1" MISC 1 each by Does not apply route daily. (Patient not taking: Reported on 10/04/2017) 100 each 2  . ONE TOUCH ULTRA TEST test strip TEST BLOOD SUGAR EVERY DAY AND AS NEEDED FOR SYMPTOMS OF HYPOGLYCEMIA (Patient not taking: Reported on 10/04/2017) 100 each 0  . oxyCODONE-acetaminophen (PERCOCET) 5-325 MG tablet Take 1 tablet by mouth every 6 (six) hours as needed for severe pain. (Patient not taking: Reported on 11/18/2017) 10 tablet 0  . Skin Protectants, Misc. (EUCERIN) cream Apply topically as needed for dry skin. 454 g 0  . triamcinolone ointment (KENALOG) 0.5 % Apply 1 application topically 2 (two) times daily. 30 g 0   No current facility-administered medications for this visit.     ROS:   General:  No weight loss, Fever, chills  HEENT: No recent headaches, no nasal bleeding, no visual changes, no sore throat  Neurologic: No dizziness,  blackouts, seizures. No recent symptoms of stroke or mini- stroke. No recent episodes of slurred speech, or temporary blindness.  Cardiac: No recent episodes of chest pain/pressure, no shortness of breath at rest.  No shortness of breath with exertion.  Denies history of atrial fibrillation or irregular heartbeat  Vascular: No history of rest pain in feet.  No history of claudication.  No history of non-healing ulcer, No history of DVT   Pulmonary: No home oxygen, no  productive cough, no hemoptysis,  No asthma or wheezing  Musculoskeletal:  [ ]  Arthritis, [ ]  Low back pain,  [x ] Joint pain  Hematologic:No history of hypercoagulable state.  No history of easy bleeding.  No history of anemia  Gastrointestinal: No hematochezia or melena,  No gastroesophageal reflux, no trouble swallowing, left abdominal hernia  Urinary: [ ]  chronic Kidney disease, [x ] on HD - [ ]  MWF or [x ] TTHS, [ ]  Burning with urination, [ ]  Frequent urination, [ ]  Difficulty urinating;   Skin: No rashes  Psychological: No history of anxiety,  No history of depression   Physical Examination  Vitals:   11/18/17 1553  BP: 129/64  Pulse: 74  Resp: 16  Temp: 97.9 F (36.6 C)  TempSrc: Oral  SpO2: 100%  Weight: 227 lb (103 kg)  Height: 5' 2"  (1.575 m)    Body mass index is 41.52 kg/m.  General:  Alert and oriented, no acute distress HEENT: Normal, normocephalic Neck: No bruit or JVD Pulmonary: Clear to auscultation bilaterally Cardiac: Regular Rate and Rhythm without murmur Gastrointestinal: Soft, non-tender, non-distended, comprisable left LQ hernia NTTP Skin: No rash, darken skin pigmentation digits left hand chronic Extremity Pulses:  2+ radial, brachial pulses bilaterally, left doppler palmer signal, palpable thrill in left UE AV graft Musculoskeletal: No deformity or edema, point tenderness ulnar pulse point area of the left wrist.  No erythema or edema.  No break in the skin.  Neurologic: Upper and  lower extremity motor 5/5 and symmetric     ASSESSMENT:  ESRD on HD AV graft left UE Contusion/ strain left wrist with pin point tenderness.      PLAN: Topical OTC anti inflammatory cream or pain patch, ice PRN.  He graft has a great thrill and she does not have symptoms of steal.  F/U PRN.  Roxy Horseman PA-C Vascular and Vein Specialists of [place] Office: [phone]

## 2017-11-19 DIAGNOSIS — E1129 Type 2 diabetes mellitus with other diabetic kidney complication: Secondary | ICD-10-CM | POA: Diagnosis not present

## 2017-11-19 DIAGNOSIS — N2581 Secondary hyperparathyroidism of renal origin: Secondary | ICD-10-CM | POA: Diagnosis not present

## 2017-11-19 DIAGNOSIS — D509 Iron deficiency anemia, unspecified: Secondary | ICD-10-CM | POA: Diagnosis not present

## 2017-11-19 DIAGNOSIS — D631 Anemia in chronic kidney disease: Secondary | ICD-10-CM | POA: Diagnosis not present

## 2017-11-19 DIAGNOSIS — N186 End stage renal disease: Secondary | ICD-10-CM | POA: Diagnosis not present

## 2017-11-19 DIAGNOSIS — R51 Headache: Secondary | ICD-10-CM | POA: Diagnosis not present

## 2017-11-19 DIAGNOSIS — D688 Other specified coagulation defects: Secondary | ICD-10-CM | POA: Diagnosis not present

## 2017-11-22 DIAGNOSIS — E1129 Type 2 diabetes mellitus with other diabetic kidney complication: Secondary | ICD-10-CM | POA: Diagnosis not present

## 2017-11-22 DIAGNOSIS — N2581 Secondary hyperparathyroidism of renal origin: Secondary | ICD-10-CM | POA: Diagnosis not present

## 2017-11-22 DIAGNOSIS — R51 Headache: Secondary | ICD-10-CM | POA: Diagnosis not present

## 2017-11-22 DIAGNOSIS — N186 End stage renal disease: Secondary | ICD-10-CM | POA: Diagnosis not present

## 2017-11-22 DIAGNOSIS — D509 Iron deficiency anemia, unspecified: Secondary | ICD-10-CM | POA: Diagnosis not present

## 2017-11-22 DIAGNOSIS — D631 Anemia in chronic kidney disease: Secondary | ICD-10-CM | POA: Diagnosis not present

## 2017-11-22 DIAGNOSIS — D688 Other specified coagulation defects: Secondary | ICD-10-CM | POA: Diagnosis not present

## 2017-11-24 DIAGNOSIS — D631 Anemia in chronic kidney disease: Secondary | ICD-10-CM | POA: Diagnosis not present

## 2017-11-24 DIAGNOSIS — D509 Iron deficiency anemia, unspecified: Secondary | ICD-10-CM | POA: Diagnosis not present

## 2017-11-24 DIAGNOSIS — N2581 Secondary hyperparathyroidism of renal origin: Secondary | ICD-10-CM | POA: Diagnosis not present

## 2017-11-24 DIAGNOSIS — E1129 Type 2 diabetes mellitus with other diabetic kidney complication: Secondary | ICD-10-CM | POA: Diagnosis not present

## 2017-11-24 DIAGNOSIS — R51 Headache: Secondary | ICD-10-CM | POA: Diagnosis not present

## 2017-11-24 DIAGNOSIS — D688 Other specified coagulation defects: Secondary | ICD-10-CM | POA: Diagnosis not present

## 2017-11-24 DIAGNOSIS — N186 End stage renal disease: Secondary | ICD-10-CM | POA: Diagnosis not present

## 2017-11-25 ENCOUNTER — Ambulatory Visit: Payer: Medicare Other | Admitting: Podiatry

## 2017-11-26 DIAGNOSIS — N2581 Secondary hyperparathyroidism of renal origin: Secondary | ICD-10-CM | POA: Diagnosis not present

## 2017-11-26 DIAGNOSIS — N186 End stage renal disease: Secondary | ICD-10-CM | POA: Diagnosis not present

## 2017-11-26 DIAGNOSIS — E1129 Type 2 diabetes mellitus with other diabetic kidney complication: Secondary | ICD-10-CM | POA: Diagnosis not present

## 2017-11-26 DIAGNOSIS — R51 Headache: Secondary | ICD-10-CM | POA: Diagnosis not present

## 2017-11-26 DIAGNOSIS — D631 Anemia in chronic kidney disease: Secondary | ICD-10-CM | POA: Diagnosis not present

## 2017-11-26 DIAGNOSIS — D688 Other specified coagulation defects: Secondary | ICD-10-CM | POA: Diagnosis not present

## 2017-11-26 DIAGNOSIS — D509 Iron deficiency anemia, unspecified: Secondary | ICD-10-CM | POA: Diagnosis not present

## 2017-11-29 DIAGNOSIS — N2581 Secondary hyperparathyroidism of renal origin: Secondary | ICD-10-CM | POA: Diagnosis not present

## 2017-11-29 DIAGNOSIS — N186 End stage renal disease: Secondary | ICD-10-CM | POA: Diagnosis not present

## 2017-11-29 DIAGNOSIS — D509 Iron deficiency anemia, unspecified: Secondary | ICD-10-CM | POA: Diagnosis not present

## 2017-11-29 DIAGNOSIS — R51 Headache: Secondary | ICD-10-CM | POA: Diagnosis not present

## 2017-11-29 DIAGNOSIS — D688 Other specified coagulation defects: Secondary | ICD-10-CM | POA: Diagnosis not present

## 2017-11-29 DIAGNOSIS — D631 Anemia in chronic kidney disease: Secondary | ICD-10-CM | POA: Diagnosis not present

## 2017-11-29 DIAGNOSIS — E1129 Type 2 diabetes mellitus with other diabetic kidney complication: Secondary | ICD-10-CM | POA: Diagnosis not present

## 2017-12-01 DIAGNOSIS — D509 Iron deficiency anemia, unspecified: Secondary | ICD-10-CM | POA: Diagnosis not present

## 2017-12-01 DIAGNOSIS — E1129 Type 2 diabetes mellitus with other diabetic kidney complication: Secondary | ICD-10-CM | POA: Diagnosis not present

## 2017-12-01 DIAGNOSIS — N186 End stage renal disease: Secondary | ICD-10-CM | POA: Diagnosis not present

## 2017-12-01 DIAGNOSIS — N2581 Secondary hyperparathyroidism of renal origin: Secondary | ICD-10-CM | POA: Diagnosis not present

## 2017-12-01 DIAGNOSIS — D688 Other specified coagulation defects: Secondary | ICD-10-CM | POA: Diagnosis not present

## 2017-12-01 DIAGNOSIS — R51 Headache: Secondary | ICD-10-CM | POA: Diagnosis not present

## 2017-12-01 DIAGNOSIS — D631 Anemia in chronic kidney disease: Secondary | ICD-10-CM | POA: Diagnosis not present

## 2017-12-03 DIAGNOSIS — D688 Other specified coagulation defects: Secondary | ICD-10-CM | POA: Diagnosis not present

## 2017-12-03 DIAGNOSIS — R51 Headache: Secondary | ICD-10-CM | POA: Diagnosis not present

## 2017-12-03 DIAGNOSIS — D509 Iron deficiency anemia, unspecified: Secondary | ICD-10-CM | POA: Diagnosis not present

## 2017-12-03 DIAGNOSIS — E1129 Type 2 diabetes mellitus with other diabetic kidney complication: Secondary | ICD-10-CM | POA: Diagnosis not present

## 2017-12-03 DIAGNOSIS — D631 Anemia in chronic kidney disease: Secondary | ICD-10-CM | POA: Diagnosis not present

## 2017-12-03 DIAGNOSIS — N186 End stage renal disease: Secondary | ICD-10-CM | POA: Diagnosis not present

## 2017-12-03 DIAGNOSIS — N2581 Secondary hyperparathyroidism of renal origin: Secondary | ICD-10-CM | POA: Diagnosis not present

## 2017-12-06 DIAGNOSIS — N186 End stage renal disease: Secondary | ICD-10-CM | POA: Diagnosis not present

## 2017-12-06 DIAGNOSIS — R51 Headache: Secondary | ICD-10-CM | POA: Diagnosis not present

## 2017-12-06 DIAGNOSIS — D631 Anemia in chronic kidney disease: Secondary | ICD-10-CM | POA: Diagnosis not present

## 2017-12-06 DIAGNOSIS — D509 Iron deficiency anemia, unspecified: Secondary | ICD-10-CM | POA: Diagnosis not present

## 2017-12-06 DIAGNOSIS — E1129 Type 2 diabetes mellitus with other diabetic kidney complication: Secondary | ICD-10-CM | POA: Diagnosis not present

## 2017-12-06 DIAGNOSIS — N2581 Secondary hyperparathyroidism of renal origin: Secondary | ICD-10-CM | POA: Diagnosis not present

## 2017-12-06 DIAGNOSIS — D688 Other specified coagulation defects: Secondary | ICD-10-CM | POA: Diagnosis not present

## 2017-12-08 DIAGNOSIS — N186 End stage renal disease: Secondary | ICD-10-CM | POA: Diagnosis not present

## 2017-12-08 DIAGNOSIS — N2581 Secondary hyperparathyroidism of renal origin: Secondary | ICD-10-CM | POA: Diagnosis not present

## 2017-12-08 DIAGNOSIS — E1129 Type 2 diabetes mellitus with other diabetic kidney complication: Secondary | ICD-10-CM | POA: Diagnosis not present

## 2017-12-08 DIAGNOSIS — R51 Headache: Secondary | ICD-10-CM | POA: Diagnosis not present

## 2017-12-08 DIAGNOSIS — D631 Anemia in chronic kidney disease: Secondary | ICD-10-CM | POA: Diagnosis not present

## 2017-12-08 DIAGNOSIS — D509 Iron deficiency anemia, unspecified: Secondary | ICD-10-CM | POA: Diagnosis not present

## 2017-12-08 DIAGNOSIS — D688 Other specified coagulation defects: Secondary | ICD-10-CM | POA: Diagnosis not present

## 2017-12-10 DIAGNOSIS — D688 Other specified coagulation defects: Secondary | ICD-10-CM | POA: Diagnosis not present

## 2017-12-10 DIAGNOSIS — N2581 Secondary hyperparathyroidism of renal origin: Secondary | ICD-10-CM | POA: Diagnosis not present

## 2017-12-10 DIAGNOSIS — D631 Anemia in chronic kidney disease: Secondary | ICD-10-CM | POA: Diagnosis not present

## 2017-12-10 DIAGNOSIS — N186 End stage renal disease: Secondary | ICD-10-CM | POA: Diagnosis not present

## 2017-12-10 DIAGNOSIS — D509 Iron deficiency anemia, unspecified: Secondary | ICD-10-CM | POA: Diagnosis not present

## 2017-12-10 DIAGNOSIS — E1129 Type 2 diabetes mellitus with other diabetic kidney complication: Secondary | ICD-10-CM | POA: Diagnosis not present

## 2017-12-10 DIAGNOSIS — R51 Headache: Secondary | ICD-10-CM | POA: Diagnosis not present

## 2017-12-13 DIAGNOSIS — D509 Iron deficiency anemia, unspecified: Secondary | ICD-10-CM | POA: Diagnosis not present

## 2017-12-13 DIAGNOSIS — N2581 Secondary hyperparathyroidism of renal origin: Secondary | ICD-10-CM | POA: Diagnosis not present

## 2017-12-13 DIAGNOSIS — N186 End stage renal disease: Secondary | ICD-10-CM | POA: Diagnosis not present

## 2017-12-13 DIAGNOSIS — D631 Anemia in chronic kidney disease: Secondary | ICD-10-CM | POA: Diagnosis not present

## 2017-12-13 DIAGNOSIS — R51 Headache: Secondary | ICD-10-CM | POA: Diagnosis not present

## 2017-12-13 DIAGNOSIS — E1129 Type 2 diabetes mellitus with other diabetic kidney complication: Secondary | ICD-10-CM | POA: Diagnosis not present

## 2017-12-13 DIAGNOSIS — D688 Other specified coagulation defects: Secondary | ICD-10-CM | POA: Diagnosis not present

## 2017-12-15 DIAGNOSIS — D631 Anemia in chronic kidney disease: Secondary | ICD-10-CM | POA: Diagnosis not present

## 2017-12-15 DIAGNOSIS — N2581 Secondary hyperparathyroidism of renal origin: Secondary | ICD-10-CM | POA: Diagnosis not present

## 2017-12-15 DIAGNOSIS — N186 End stage renal disease: Secondary | ICD-10-CM | POA: Diagnosis not present

## 2017-12-15 DIAGNOSIS — E1129 Type 2 diabetes mellitus with other diabetic kidney complication: Secondary | ICD-10-CM | POA: Diagnosis not present

## 2017-12-15 DIAGNOSIS — D509 Iron deficiency anemia, unspecified: Secondary | ICD-10-CM | POA: Diagnosis not present

## 2017-12-15 DIAGNOSIS — R51 Headache: Secondary | ICD-10-CM | POA: Diagnosis not present

## 2017-12-15 DIAGNOSIS — D688 Other specified coagulation defects: Secondary | ICD-10-CM | POA: Diagnosis not present

## 2017-12-17 DIAGNOSIS — R51 Headache: Secondary | ICD-10-CM | POA: Diagnosis not present

## 2017-12-17 DIAGNOSIS — D509 Iron deficiency anemia, unspecified: Secondary | ICD-10-CM | POA: Diagnosis not present

## 2017-12-17 DIAGNOSIS — D631 Anemia in chronic kidney disease: Secondary | ICD-10-CM | POA: Diagnosis not present

## 2017-12-17 DIAGNOSIS — D688 Other specified coagulation defects: Secondary | ICD-10-CM | POA: Diagnosis not present

## 2017-12-17 DIAGNOSIS — Z992 Dependence on renal dialysis: Secondary | ICD-10-CM | POA: Diagnosis not present

## 2017-12-17 DIAGNOSIS — N2581 Secondary hyperparathyroidism of renal origin: Secondary | ICD-10-CM | POA: Diagnosis not present

## 2017-12-17 DIAGNOSIS — E1129 Type 2 diabetes mellitus with other diabetic kidney complication: Secondary | ICD-10-CM | POA: Diagnosis not present

## 2017-12-17 DIAGNOSIS — N186 End stage renal disease: Secondary | ICD-10-CM | POA: Diagnosis not present

## 2017-12-20 DIAGNOSIS — R51 Headache: Secondary | ICD-10-CM | POA: Diagnosis not present

## 2017-12-20 DIAGNOSIS — E1129 Type 2 diabetes mellitus with other diabetic kidney complication: Secondary | ICD-10-CM | POA: Diagnosis not present

## 2017-12-20 DIAGNOSIS — N186 End stage renal disease: Secondary | ICD-10-CM | POA: Diagnosis not present

## 2017-12-20 DIAGNOSIS — D688 Other specified coagulation defects: Secondary | ICD-10-CM | POA: Diagnosis not present

## 2017-12-20 DIAGNOSIS — D509 Iron deficiency anemia, unspecified: Secondary | ICD-10-CM | POA: Diagnosis not present

## 2017-12-20 DIAGNOSIS — N2581 Secondary hyperparathyroidism of renal origin: Secondary | ICD-10-CM | POA: Diagnosis not present

## 2017-12-20 DIAGNOSIS — D631 Anemia in chronic kidney disease: Secondary | ICD-10-CM | POA: Diagnosis not present

## 2017-12-22 DIAGNOSIS — D631 Anemia in chronic kidney disease: Secondary | ICD-10-CM | POA: Diagnosis not present

## 2017-12-22 DIAGNOSIS — E1129 Type 2 diabetes mellitus with other diabetic kidney complication: Secondary | ICD-10-CM | POA: Diagnosis not present

## 2017-12-22 DIAGNOSIS — D509 Iron deficiency anemia, unspecified: Secondary | ICD-10-CM | POA: Diagnosis not present

## 2017-12-22 DIAGNOSIS — D688 Other specified coagulation defects: Secondary | ICD-10-CM | POA: Diagnosis not present

## 2017-12-22 DIAGNOSIS — R51 Headache: Secondary | ICD-10-CM | POA: Diagnosis not present

## 2017-12-22 DIAGNOSIS — N186 End stage renal disease: Secondary | ICD-10-CM | POA: Diagnosis not present

## 2017-12-22 DIAGNOSIS — N2581 Secondary hyperparathyroidism of renal origin: Secondary | ICD-10-CM | POA: Diagnosis not present

## 2017-12-24 DIAGNOSIS — N2581 Secondary hyperparathyroidism of renal origin: Secondary | ICD-10-CM | POA: Diagnosis not present

## 2017-12-24 DIAGNOSIS — D631 Anemia in chronic kidney disease: Secondary | ICD-10-CM | POA: Diagnosis not present

## 2017-12-24 DIAGNOSIS — E1129 Type 2 diabetes mellitus with other diabetic kidney complication: Secondary | ICD-10-CM | POA: Diagnosis not present

## 2017-12-24 DIAGNOSIS — D509 Iron deficiency anemia, unspecified: Secondary | ICD-10-CM | POA: Diagnosis not present

## 2017-12-24 DIAGNOSIS — N186 End stage renal disease: Secondary | ICD-10-CM | POA: Diagnosis not present

## 2017-12-24 DIAGNOSIS — R51 Headache: Secondary | ICD-10-CM | POA: Diagnosis not present

## 2017-12-24 DIAGNOSIS — D688 Other specified coagulation defects: Secondary | ICD-10-CM | POA: Diagnosis not present

## 2017-12-27 DIAGNOSIS — D509 Iron deficiency anemia, unspecified: Secondary | ICD-10-CM | POA: Diagnosis not present

## 2017-12-27 DIAGNOSIS — R51 Headache: Secondary | ICD-10-CM | POA: Diagnosis not present

## 2017-12-27 DIAGNOSIS — N186 End stage renal disease: Secondary | ICD-10-CM | POA: Diagnosis not present

## 2017-12-27 DIAGNOSIS — E1129 Type 2 diabetes mellitus with other diabetic kidney complication: Secondary | ICD-10-CM | POA: Diagnosis not present

## 2017-12-27 DIAGNOSIS — D688 Other specified coagulation defects: Secondary | ICD-10-CM | POA: Diagnosis not present

## 2017-12-27 DIAGNOSIS — D631 Anemia in chronic kidney disease: Secondary | ICD-10-CM | POA: Diagnosis not present

## 2017-12-27 DIAGNOSIS — N2581 Secondary hyperparathyroidism of renal origin: Secondary | ICD-10-CM | POA: Diagnosis not present

## 2017-12-29 DIAGNOSIS — R51 Headache: Secondary | ICD-10-CM | POA: Diagnosis not present

## 2017-12-29 DIAGNOSIS — N2581 Secondary hyperparathyroidism of renal origin: Secondary | ICD-10-CM | POA: Diagnosis not present

## 2017-12-29 DIAGNOSIS — N186 End stage renal disease: Secondary | ICD-10-CM | POA: Diagnosis not present

## 2017-12-29 DIAGNOSIS — D509 Iron deficiency anemia, unspecified: Secondary | ICD-10-CM | POA: Diagnosis not present

## 2017-12-29 DIAGNOSIS — D631 Anemia in chronic kidney disease: Secondary | ICD-10-CM | POA: Diagnosis not present

## 2017-12-29 DIAGNOSIS — E1129 Type 2 diabetes mellitus with other diabetic kidney complication: Secondary | ICD-10-CM | POA: Diagnosis not present

## 2017-12-29 DIAGNOSIS — D688 Other specified coagulation defects: Secondary | ICD-10-CM | POA: Diagnosis not present

## 2017-12-31 DIAGNOSIS — D688 Other specified coagulation defects: Secondary | ICD-10-CM | POA: Diagnosis not present

## 2017-12-31 DIAGNOSIS — E1129 Type 2 diabetes mellitus with other diabetic kidney complication: Secondary | ICD-10-CM | POA: Diagnosis not present

## 2017-12-31 DIAGNOSIS — N2581 Secondary hyperparathyroidism of renal origin: Secondary | ICD-10-CM | POA: Diagnosis not present

## 2017-12-31 DIAGNOSIS — R51 Headache: Secondary | ICD-10-CM | POA: Diagnosis not present

## 2017-12-31 DIAGNOSIS — D509 Iron deficiency anemia, unspecified: Secondary | ICD-10-CM | POA: Diagnosis not present

## 2017-12-31 DIAGNOSIS — D631 Anemia in chronic kidney disease: Secondary | ICD-10-CM | POA: Diagnosis not present

## 2017-12-31 DIAGNOSIS — N186 End stage renal disease: Secondary | ICD-10-CM | POA: Diagnosis not present

## 2018-01-03 DIAGNOSIS — D631 Anemia in chronic kidney disease: Secondary | ICD-10-CM | POA: Diagnosis not present

## 2018-01-03 DIAGNOSIS — N186 End stage renal disease: Secondary | ICD-10-CM | POA: Diagnosis not present

## 2018-01-03 DIAGNOSIS — N2581 Secondary hyperparathyroidism of renal origin: Secondary | ICD-10-CM | POA: Diagnosis not present

## 2018-01-03 DIAGNOSIS — D688 Other specified coagulation defects: Secondary | ICD-10-CM | POA: Diagnosis not present

## 2018-01-03 DIAGNOSIS — E1129 Type 2 diabetes mellitus with other diabetic kidney complication: Secondary | ICD-10-CM | POA: Diagnosis not present

## 2018-01-03 DIAGNOSIS — D509 Iron deficiency anemia, unspecified: Secondary | ICD-10-CM | POA: Diagnosis not present

## 2018-01-03 DIAGNOSIS — R51 Headache: Secondary | ICD-10-CM | POA: Diagnosis not present

## 2018-01-05 DIAGNOSIS — N186 End stage renal disease: Secondary | ICD-10-CM | POA: Diagnosis not present

## 2018-01-05 DIAGNOSIS — N2581 Secondary hyperparathyroidism of renal origin: Secondary | ICD-10-CM | POA: Diagnosis not present

## 2018-01-05 DIAGNOSIS — R51 Headache: Secondary | ICD-10-CM | POA: Diagnosis not present

## 2018-01-05 DIAGNOSIS — D509 Iron deficiency anemia, unspecified: Secondary | ICD-10-CM | POA: Diagnosis not present

## 2018-01-05 DIAGNOSIS — D631 Anemia in chronic kidney disease: Secondary | ICD-10-CM | POA: Diagnosis not present

## 2018-01-05 DIAGNOSIS — D688 Other specified coagulation defects: Secondary | ICD-10-CM | POA: Diagnosis not present

## 2018-01-05 DIAGNOSIS — E1129 Type 2 diabetes mellitus with other diabetic kidney complication: Secondary | ICD-10-CM | POA: Diagnosis not present

## 2018-01-07 DIAGNOSIS — D631 Anemia in chronic kidney disease: Secondary | ICD-10-CM | POA: Diagnosis not present

## 2018-01-07 DIAGNOSIS — D509 Iron deficiency anemia, unspecified: Secondary | ICD-10-CM | POA: Diagnosis not present

## 2018-01-07 DIAGNOSIS — N186 End stage renal disease: Secondary | ICD-10-CM | POA: Diagnosis not present

## 2018-01-07 DIAGNOSIS — D688 Other specified coagulation defects: Secondary | ICD-10-CM | POA: Diagnosis not present

## 2018-01-07 DIAGNOSIS — N2581 Secondary hyperparathyroidism of renal origin: Secondary | ICD-10-CM | POA: Diagnosis not present

## 2018-01-07 DIAGNOSIS — E1129 Type 2 diabetes mellitus with other diabetic kidney complication: Secondary | ICD-10-CM | POA: Diagnosis not present

## 2018-01-07 DIAGNOSIS — R51 Headache: Secondary | ICD-10-CM | POA: Diagnosis not present

## 2018-01-10 DIAGNOSIS — N186 End stage renal disease: Secondary | ICD-10-CM | POA: Diagnosis not present

## 2018-01-10 DIAGNOSIS — E1129 Type 2 diabetes mellitus with other diabetic kidney complication: Secondary | ICD-10-CM | POA: Diagnosis not present

## 2018-01-10 DIAGNOSIS — D631 Anemia in chronic kidney disease: Secondary | ICD-10-CM | POA: Diagnosis not present

## 2018-01-10 DIAGNOSIS — R51 Headache: Secondary | ICD-10-CM | POA: Diagnosis not present

## 2018-01-10 DIAGNOSIS — D509 Iron deficiency anemia, unspecified: Secondary | ICD-10-CM | POA: Diagnosis not present

## 2018-01-10 DIAGNOSIS — N2581 Secondary hyperparathyroidism of renal origin: Secondary | ICD-10-CM | POA: Diagnosis not present

## 2018-01-10 DIAGNOSIS — D688 Other specified coagulation defects: Secondary | ICD-10-CM | POA: Diagnosis not present

## 2018-01-12 DIAGNOSIS — D631 Anemia in chronic kidney disease: Secondary | ICD-10-CM | POA: Diagnosis not present

## 2018-01-12 DIAGNOSIS — D688 Other specified coagulation defects: Secondary | ICD-10-CM | POA: Diagnosis not present

## 2018-01-12 DIAGNOSIS — D509 Iron deficiency anemia, unspecified: Secondary | ICD-10-CM | POA: Diagnosis not present

## 2018-01-12 DIAGNOSIS — N2581 Secondary hyperparathyroidism of renal origin: Secondary | ICD-10-CM | POA: Diagnosis not present

## 2018-01-12 DIAGNOSIS — N186 End stage renal disease: Secondary | ICD-10-CM | POA: Diagnosis not present

## 2018-01-12 DIAGNOSIS — E1129 Type 2 diabetes mellitus with other diabetic kidney complication: Secondary | ICD-10-CM | POA: Diagnosis not present

## 2018-01-12 DIAGNOSIS — R51 Headache: Secondary | ICD-10-CM | POA: Diagnosis not present

## 2018-01-14 DIAGNOSIS — E1129 Type 2 diabetes mellitus with other diabetic kidney complication: Secondary | ICD-10-CM | POA: Diagnosis not present

## 2018-01-14 DIAGNOSIS — N186 End stage renal disease: Secondary | ICD-10-CM | POA: Diagnosis not present

## 2018-01-14 DIAGNOSIS — D631 Anemia in chronic kidney disease: Secondary | ICD-10-CM | POA: Diagnosis not present

## 2018-01-14 DIAGNOSIS — D688 Other specified coagulation defects: Secondary | ICD-10-CM | POA: Diagnosis not present

## 2018-01-14 DIAGNOSIS — D509 Iron deficiency anemia, unspecified: Secondary | ICD-10-CM | POA: Diagnosis not present

## 2018-01-14 DIAGNOSIS — R51 Headache: Secondary | ICD-10-CM | POA: Diagnosis not present

## 2018-01-14 DIAGNOSIS — N2581 Secondary hyperparathyroidism of renal origin: Secondary | ICD-10-CM | POA: Diagnosis not present

## 2018-01-16 DIAGNOSIS — E1129 Type 2 diabetes mellitus with other diabetic kidney complication: Secondary | ICD-10-CM | POA: Diagnosis not present

## 2018-01-16 DIAGNOSIS — Z992 Dependence on renal dialysis: Secondary | ICD-10-CM | POA: Diagnosis not present

## 2018-01-16 DIAGNOSIS — N186 End stage renal disease: Secondary | ICD-10-CM | POA: Diagnosis not present

## 2018-01-17 DIAGNOSIS — R51 Headache: Secondary | ICD-10-CM | POA: Diagnosis not present

## 2018-01-17 DIAGNOSIS — Z23 Encounter for immunization: Secondary | ICD-10-CM | POA: Diagnosis not present

## 2018-01-17 DIAGNOSIS — D631 Anemia in chronic kidney disease: Secondary | ICD-10-CM | POA: Diagnosis not present

## 2018-01-17 DIAGNOSIS — D509 Iron deficiency anemia, unspecified: Secondary | ICD-10-CM | POA: Diagnosis not present

## 2018-01-17 DIAGNOSIS — N186 End stage renal disease: Secondary | ICD-10-CM | POA: Diagnosis not present

## 2018-01-17 DIAGNOSIS — D688 Other specified coagulation defects: Secondary | ICD-10-CM | POA: Diagnosis not present

## 2018-01-17 DIAGNOSIS — N2581 Secondary hyperparathyroidism of renal origin: Secondary | ICD-10-CM | POA: Diagnosis not present

## 2018-01-17 DIAGNOSIS — E1129 Type 2 diabetes mellitus with other diabetic kidney complication: Secondary | ICD-10-CM | POA: Diagnosis not present

## 2018-01-18 ENCOUNTER — Encounter: Payer: Medicare Other | Admitting: Podiatry

## 2018-01-19 DIAGNOSIS — N2581 Secondary hyperparathyroidism of renal origin: Secondary | ICD-10-CM | POA: Diagnosis not present

## 2018-01-19 DIAGNOSIS — R51 Headache: Secondary | ICD-10-CM | POA: Diagnosis not present

## 2018-01-19 DIAGNOSIS — E1129 Type 2 diabetes mellitus with other diabetic kidney complication: Secondary | ICD-10-CM | POA: Diagnosis not present

## 2018-01-19 DIAGNOSIS — D631 Anemia in chronic kidney disease: Secondary | ICD-10-CM | POA: Diagnosis not present

## 2018-01-19 DIAGNOSIS — D509 Iron deficiency anemia, unspecified: Secondary | ICD-10-CM | POA: Diagnosis not present

## 2018-01-19 DIAGNOSIS — N186 End stage renal disease: Secondary | ICD-10-CM | POA: Diagnosis not present

## 2018-01-19 DIAGNOSIS — D688 Other specified coagulation defects: Secondary | ICD-10-CM | POA: Diagnosis not present

## 2018-01-19 DIAGNOSIS — Z23 Encounter for immunization: Secondary | ICD-10-CM | POA: Diagnosis not present

## 2018-01-20 ENCOUNTER — Telehealth: Payer: Self-pay | Admitting: Family Medicine

## 2018-01-20 ENCOUNTER — Other Ambulatory Visit: Payer: Self-pay | Admitting: Family Medicine

## 2018-01-20 DIAGNOSIS — E118 Type 2 diabetes mellitus with unspecified complications: Secondary | ICD-10-CM

## 2018-01-20 MED ORDER — INSULIN GLARGINE 100 UNIT/ML SOLOSTAR PEN: PEN_INJECTOR | SUBCUTANEOUS | 0 refills | Status: DC

## 2018-01-20 NOTE — Telephone Encounter (Signed)
Patient needs an [employer] but only on Mon, Wed, Friday because she has dialysis on other days.  I did not see anything on schedule for you.  Can you call her to schedule one please?  Thanks

## 2018-01-20 NOTE — Telephone Encounter (Signed)
Pt said that she been waiting over a week for her insulin. She is out and really needs this. Please call in right away.jw

## 2018-01-20 NOTE — Telephone Encounter (Signed)
November template not out. Please ask patient to call back in a couple of weeks.  Danley Danker, RN [facility] RN)

## 2018-01-21 DIAGNOSIS — Z23 Encounter for immunization: Secondary | ICD-10-CM | POA: Diagnosis not present

## 2018-01-21 DIAGNOSIS — D509 Iron deficiency anemia, unspecified: Secondary | ICD-10-CM | POA: Diagnosis not present

## 2018-01-21 DIAGNOSIS — N2581 Secondary hyperparathyroidism of renal origin: Secondary | ICD-10-CM | POA: Diagnosis not present

## 2018-01-21 DIAGNOSIS — E1129 Type 2 diabetes mellitus with other diabetic kidney complication: Secondary | ICD-10-CM | POA: Diagnosis not present

## 2018-01-21 DIAGNOSIS — D688 Other specified coagulation defects: Secondary | ICD-10-CM | POA: Diagnosis not present

## 2018-01-21 DIAGNOSIS — N186 End stage renal disease: Secondary | ICD-10-CM | POA: Diagnosis not present

## 2018-01-21 DIAGNOSIS — R51 Headache: Secondary | ICD-10-CM | POA: Diagnosis not present

## 2018-01-21 DIAGNOSIS — D631 Anemia in chronic kidney disease: Secondary | ICD-10-CM | POA: Diagnosis not present

## 2018-01-24 DIAGNOSIS — N2581 Secondary hyperparathyroidism of renal origin: Secondary | ICD-10-CM | POA: Diagnosis not present

## 2018-01-24 DIAGNOSIS — N186 End stage renal disease: Secondary | ICD-10-CM | POA: Diagnosis not present

## 2018-01-24 DIAGNOSIS — R51 Headache: Secondary | ICD-10-CM | POA: Diagnosis not present

## 2018-01-24 DIAGNOSIS — Z23 Encounter for immunization: Secondary | ICD-10-CM | POA: Diagnosis not present

## 2018-01-24 DIAGNOSIS — D688 Other specified coagulation defects: Secondary | ICD-10-CM | POA: Diagnosis not present

## 2018-01-24 DIAGNOSIS — D509 Iron deficiency anemia, unspecified: Secondary | ICD-10-CM | POA: Diagnosis not present

## 2018-01-24 DIAGNOSIS — E1129 Type 2 diabetes mellitus with other diabetic kidney complication: Secondary | ICD-10-CM | POA: Diagnosis not present

## 2018-01-24 DIAGNOSIS — D631 Anemia in chronic kidney disease: Secondary | ICD-10-CM | POA: Diagnosis not present

## 2018-01-24 NOTE — Progress Notes (Signed)
This encounter was created in error - please disregard.

## 2018-01-26 DIAGNOSIS — N186 End stage renal disease: Secondary | ICD-10-CM | POA: Diagnosis not present

## 2018-01-26 DIAGNOSIS — D509 Iron deficiency anemia, unspecified: Secondary | ICD-10-CM | POA: Diagnosis not present

## 2018-01-26 DIAGNOSIS — N2581 Secondary hyperparathyroidism of renal origin: Secondary | ICD-10-CM | POA: Diagnosis not present

## 2018-01-26 DIAGNOSIS — D688 Other specified coagulation defects: Secondary | ICD-10-CM | POA: Diagnosis not present

## 2018-01-26 DIAGNOSIS — R51 Headache: Secondary | ICD-10-CM | POA: Diagnosis not present

## 2018-01-26 DIAGNOSIS — Z23 Encounter for immunization: Secondary | ICD-10-CM | POA: Diagnosis not present

## 2018-01-26 DIAGNOSIS — E1129 Type 2 diabetes mellitus with other diabetic kidney complication: Secondary | ICD-10-CM | POA: Diagnosis not present

## 2018-01-26 DIAGNOSIS — D631 Anemia in chronic kidney disease: Secondary | ICD-10-CM | POA: Diagnosis not present

## 2018-01-28 DIAGNOSIS — N2581 Secondary hyperparathyroidism of renal origin: Secondary | ICD-10-CM | POA: Diagnosis not present

## 2018-01-28 DIAGNOSIS — E1129 Type 2 diabetes mellitus with other diabetic kidney complication: Secondary | ICD-10-CM | POA: Diagnosis not present

## 2018-01-28 DIAGNOSIS — R51 Headache: Secondary | ICD-10-CM | POA: Diagnosis not present

## 2018-01-28 DIAGNOSIS — D688 Other specified coagulation defects: Secondary | ICD-10-CM | POA: Diagnosis not present

## 2018-01-28 DIAGNOSIS — D509 Iron deficiency anemia, unspecified: Secondary | ICD-10-CM | POA: Diagnosis not present

## 2018-01-28 DIAGNOSIS — D631 Anemia in chronic kidney disease: Secondary | ICD-10-CM | POA: Diagnosis not present

## 2018-01-28 DIAGNOSIS — Z23 Encounter for immunization: Secondary | ICD-10-CM | POA: Diagnosis not present

## 2018-01-28 DIAGNOSIS — N186 End stage renal disease: Secondary | ICD-10-CM | POA: Diagnosis not present

## 2018-01-30 DIAGNOSIS — H524 Presbyopia: Secondary | ICD-10-CM | POA: Diagnosis not present

## 2018-01-30 DIAGNOSIS — E119 Type 2 diabetes mellitus without complications: Secondary | ICD-10-CM | POA: Diagnosis not present

## 2018-01-31 DIAGNOSIS — D509 Iron deficiency anemia, unspecified: Secondary | ICD-10-CM | POA: Diagnosis not present

## 2018-01-31 DIAGNOSIS — N186 End stage renal disease: Secondary | ICD-10-CM | POA: Diagnosis not present

## 2018-01-31 DIAGNOSIS — N2581 Secondary hyperparathyroidism of renal origin: Secondary | ICD-10-CM | POA: Diagnosis not present

## 2018-01-31 DIAGNOSIS — D631 Anemia in chronic kidney disease: Secondary | ICD-10-CM | POA: Diagnosis not present

## 2018-01-31 DIAGNOSIS — E1129 Type 2 diabetes mellitus with other diabetic kidney complication: Secondary | ICD-10-CM | POA: Diagnosis not present

## 2018-01-31 DIAGNOSIS — R51 Headache: Secondary | ICD-10-CM | POA: Diagnosis not present

## 2018-01-31 DIAGNOSIS — Z23 Encounter for immunization: Secondary | ICD-10-CM | POA: Diagnosis not present

## 2018-01-31 DIAGNOSIS — D688 Other specified coagulation defects: Secondary | ICD-10-CM | POA: Diagnosis not present

## 2018-02-02 DIAGNOSIS — N186 End stage renal disease: Secondary | ICD-10-CM | POA: Diagnosis not present

## 2018-02-02 DIAGNOSIS — D688 Other specified coagulation defects: Secondary | ICD-10-CM | POA: Diagnosis not present

## 2018-02-02 DIAGNOSIS — N2581 Secondary hyperparathyroidism of renal origin: Secondary | ICD-10-CM | POA: Diagnosis not present

## 2018-02-02 DIAGNOSIS — R51 Headache: Secondary | ICD-10-CM | POA: Diagnosis not present

## 2018-02-02 DIAGNOSIS — E1129 Type 2 diabetes mellitus with other diabetic kidney complication: Secondary | ICD-10-CM | POA: Diagnosis not present

## 2018-02-02 DIAGNOSIS — D631 Anemia in chronic kidney disease: Secondary | ICD-10-CM | POA: Diagnosis not present

## 2018-02-02 DIAGNOSIS — Z23 Encounter for immunization: Secondary | ICD-10-CM | POA: Diagnosis not present

## 2018-02-02 DIAGNOSIS — D509 Iron deficiency anemia, unspecified: Secondary | ICD-10-CM | POA: Diagnosis not present

## 2018-02-04 DIAGNOSIS — N186 End stage renal disease: Secondary | ICD-10-CM | POA: Diagnosis not present

## 2018-02-04 DIAGNOSIS — N2581 Secondary hyperparathyroidism of renal origin: Secondary | ICD-10-CM | POA: Diagnosis not present

## 2018-02-04 DIAGNOSIS — D688 Other specified coagulation defects: Secondary | ICD-10-CM | POA: Diagnosis not present

## 2018-02-04 DIAGNOSIS — D509 Iron deficiency anemia, unspecified: Secondary | ICD-10-CM | POA: Diagnosis not present

## 2018-02-04 DIAGNOSIS — R51 Headache: Secondary | ICD-10-CM | POA: Diagnosis not present

## 2018-02-04 DIAGNOSIS — E1129 Type 2 diabetes mellitus with other diabetic kidney complication: Secondary | ICD-10-CM | POA: Diagnosis not present

## 2018-02-04 DIAGNOSIS — D631 Anemia in chronic kidney disease: Secondary | ICD-10-CM | POA: Diagnosis not present

## 2018-02-04 DIAGNOSIS — Z23 Encounter for immunization: Secondary | ICD-10-CM | POA: Diagnosis not present

## 2018-02-06 ENCOUNTER — Encounter: Payer: Self-pay | Admitting: Podiatry

## 2018-02-06 ENCOUNTER — Ambulatory Visit (INDEPENDENT_AMBULATORY_CARE_PROVIDER_SITE_OTHER): Payer: Medicare Other | Admitting: Podiatry

## 2018-02-06 DIAGNOSIS — M79609 Pain in unspecified limb: Principal | ICD-10-CM

## 2018-02-06 DIAGNOSIS — M79676 Pain in unspecified toe(s): Secondary | ICD-10-CM | POA: Diagnosis not present

## 2018-02-06 DIAGNOSIS — B351 Tinea unguium: Secondary | ICD-10-CM

## 2018-02-06 DIAGNOSIS — E0843 Diabetes mellitus due to underlying condition with diabetic autonomic (poly)neuropathy: Secondary | ICD-10-CM | POA: Diagnosis not present

## 2018-02-07 DIAGNOSIS — N186 End stage renal disease: Secondary | ICD-10-CM | POA: Diagnosis not present

## 2018-02-07 DIAGNOSIS — R51 Headache: Secondary | ICD-10-CM | POA: Diagnosis not present

## 2018-02-07 DIAGNOSIS — E1129 Type 2 diabetes mellitus with other diabetic kidney complication: Secondary | ICD-10-CM | POA: Diagnosis not present

## 2018-02-07 DIAGNOSIS — D688 Other specified coagulation defects: Secondary | ICD-10-CM | POA: Diagnosis not present

## 2018-02-07 DIAGNOSIS — Z23 Encounter for immunization: Secondary | ICD-10-CM | POA: Diagnosis not present

## 2018-02-07 DIAGNOSIS — D509 Iron deficiency anemia, unspecified: Secondary | ICD-10-CM | POA: Diagnosis not present

## 2018-02-07 DIAGNOSIS — N2581 Secondary hyperparathyroidism of renal origin: Secondary | ICD-10-CM | POA: Diagnosis not present

## 2018-02-07 DIAGNOSIS — D631 Anemia in chronic kidney disease: Secondary | ICD-10-CM | POA: Diagnosis not present

## 2018-02-08 DIAGNOSIS — N186 End stage renal disease: Secondary | ICD-10-CM | POA: Diagnosis not present

## 2018-02-08 DIAGNOSIS — I871 Compression of vein: Secondary | ICD-10-CM | POA: Diagnosis not present

## 2018-02-08 DIAGNOSIS — Z992 Dependence on renal dialysis: Secondary | ICD-10-CM | POA: Diagnosis not present

## 2018-02-08 DIAGNOSIS — T82858A Stenosis of vascular prosthetic devices, implants and grafts, initial encounter: Secondary | ICD-10-CM | POA: Diagnosis not present

## 2018-02-09 DIAGNOSIS — Z23 Encounter for immunization: Secondary | ICD-10-CM | POA: Diagnosis not present

## 2018-02-09 DIAGNOSIS — N2581 Secondary hyperparathyroidism of renal origin: Secondary | ICD-10-CM | POA: Diagnosis not present

## 2018-02-09 DIAGNOSIS — D509 Iron deficiency anemia, unspecified: Secondary | ICD-10-CM | POA: Diagnosis not present

## 2018-02-09 DIAGNOSIS — N186 End stage renal disease: Secondary | ICD-10-CM | POA: Diagnosis not present

## 2018-02-09 DIAGNOSIS — D688 Other specified coagulation defects: Secondary | ICD-10-CM | POA: Diagnosis not present

## 2018-02-09 DIAGNOSIS — D631 Anemia in chronic kidney disease: Secondary | ICD-10-CM | POA: Diagnosis not present

## 2018-02-09 DIAGNOSIS — E1129 Type 2 diabetes mellitus with other diabetic kidney complication: Secondary | ICD-10-CM | POA: Diagnosis not present

## 2018-02-09 DIAGNOSIS — R51 Headache: Secondary | ICD-10-CM | POA: Diagnosis not present

## 2018-02-11 ENCOUNTER — Other Ambulatory Visit: Payer: Self-pay | Admitting: Vascular Surgery

## 2018-02-11 DIAGNOSIS — N186 End stage renal disease: Secondary | ICD-10-CM | POA: Diagnosis not present

## 2018-02-11 DIAGNOSIS — R51 Headache: Secondary | ICD-10-CM | POA: Diagnosis not present

## 2018-02-11 DIAGNOSIS — N2581 Secondary hyperparathyroidism of renal origin: Secondary | ICD-10-CM | POA: Diagnosis not present

## 2018-02-11 DIAGNOSIS — D509 Iron deficiency anemia, unspecified: Secondary | ICD-10-CM | POA: Diagnosis not present

## 2018-02-11 DIAGNOSIS — D688 Other specified coagulation defects: Secondary | ICD-10-CM | POA: Diagnosis not present

## 2018-02-11 DIAGNOSIS — Z23 Encounter for immunization: Secondary | ICD-10-CM | POA: Diagnosis not present

## 2018-02-11 DIAGNOSIS — D631 Anemia in chronic kidney disease: Secondary | ICD-10-CM | POA: Diagnosis not present

## 2018-02-11 DIAGNOSIS — E1129 Type 2 diabetes mellitus with other diabetic kidney complication: Secondary | ICD-10-CM | POA: Diagnosis not present

## 2018-02-12 NOTE — Progress Notes (Signed)
   SUBJECTIVE Patient with a history of diabetes mellitus presents to office today complaining of elongated, thickened nails that cause pain while ambulating in shoes. She is unable to trim her own nails. Patient is here for further evaluation and treatment.   Past Medical History:  Diagnosis Date  . Arthritis   . CHF (congestive heart failure) ([place])   . Depression   . Diabetes mellitus    type 2  . Hernia   . Hyperlipidemia   . Hypertension   . Iron deficiency anemia   . Low iron   . Peripheral vascular disease (HCC)    in legs  . Pneumonia    teenager  . Renal disorder    CKD - dialysis T/TH/Sa  . Shortness of breath dyspnea    with exertion    OBJECTIVE General Patient is awake, alert, and oriented x 3 and in no acute distress. Derm Skin is dry and supple bilateral. Negative open lesions or macerations. Remaining integument unremarkable. Nails are tender, long, thickened and dystrophic with subungual debris, consistent with onychomycosis, 1-5 bilateral. No signs of infection noted. Vasc  DP and PT pedal pulses palpable bilaterally. Temperature gradient within normal limits.  Neuro Epicritic and protective threshold sensation diminished bilaterally.  Musculoskeletal Exam No symptomatic pedal deformities noted bilateral. Muscular strength within normal limits.  ASSESSMENT 1. Diabetes Mellitus w/ peripheral neuropathy 2. Onychomycosis of nail due to dermatophyte bilateral 3. Pain in foot bilateral  PLAN OF CARE 1. Patient evaluated today. 2. Instructed to maintain good pedal hygiene and foot care. Stressed importance of controlling blood sugar.  3. Mechanical debridement of nails 1-5 bilaterally performed using a nail nipper. Filed with dremel without incident.  4. Return to clinic in 3 mos.     Edrick Kins, DPM [facility]  Dr. Edrick Kins, [place]                                        [place],  [address]                  Office [phone]  Fax [phone]

## 2018-02-14 DIAGNOSIS — R51 Headache: Secondary | ICD-10-CM | POA: Diagnosis not present

## 2018-02-14 DIAGNOSIS — N186 End stage renal disease: Secondary | ICD-10-CM | POA: Diagnosis not present

## 2018-02-14 DIAGNOSIS — E1129 Type 2 diabetes mellitus with other diabetic kidney complication: Secondary | ICD-10-CM | POA: Diagnosis not present

## 2018-02-14 DIAGNOSIS — D509 Iron deficiency anemia, unspecified: Secondary | ICD-10-CM | POA: Diagnosis not present

## 2018-02-14 DIAGNOSIS — N2581 Secondary hyperparathyroidism of renal origin: Secondary | ICD-10-CM | POA: Diagnosis not present

## 2018-02-14 DIAGNOSIS — D688 Other specified coagulation defects: Secondary | ICD-10-CM | POA: Diagnosis not present

## 2018-02-14 DIAGNOSIS — D631 Anemia in chronic kidney disease: Secondary | ICD-10-CM | POA: Diagnosis not present

## 2018-02-14 DIAGNOSIS — Z23 Encounter for immunization: Secondary | ICD-10-CM | POA: Diagnosis not present

## 2018-02-16 DIAGNOSIS — E1129 Type 2 diabetes mellitus with other diabetic kidney complication: Secondary | ICD-10-CM | POA: Diagnosis not present

## 2018-02-16 DIAGNOSIS — D509 Iron deficiency anemia, unspecified: Secondary | ICD-10-CM | POA: Diagnosis not present

## 2018-02-16 DIAGNOSIS — Z992 Dependence on renal dialysis: Secondary | ICD-10-CM | POA: Diagnosis not present

## 2018-02-16 DIAGNOSIS — D631 Anemia in chronic kidney disease: Secondary | ICD-10-CM | POA: Diagnosis not present

## 2018-02-16 DIAGNOSIS — D688 Other specified coagulation defects: Secondary | ICD-10-CM | POA: Diagnosis not present

## 2018-02-16 DIAGNOSIS — R51 Headache: Secondary | ICD-10-CM | POA: Diagnosis not present

## 2018-02-16 DIAGNOSIS — N2581 Secondary hyperparathyroidism of renal origin: Secondary | ICD-10-CM | POA: Diagnosis not present

## 2018-02-16 DIAGNOSIS — N186 End stage renal disease: Secondary | ICD-10-CM | POA: Diagnosis not present

## 2018-02-16 DIAGNOSIS — Z23 Encounter for immunization: Secondary | ICD-10-CM | POA: Diagnosis not present

## 2018-02-17 ENCOUNTER — Other Ambulatory Visit: Payer: Self-pay | Admitting: Family Medicine

## 2018-02-17 MED ORDER — ATORVASTATIN CALCIUM 40 MG PO TABS
ORAL_TABLET | ORAL | 0 refills | Status: DC
Start: 2018-02-17 — End: 2018-05-16

## 2018-02-17 NOTE — Telephone Encounter (Signed)
Pt is calling to check on getting her Lipitor refilled. She would like for someone to call her when this has been done.

## 2018-02-17 NOTE — Telephone Encounter (Signed)
Tried to call patient but number wasn't working.  Pharmacy will let her know that script is ready for pick up.  Azul Coffie,CMA

## 2018-02-18 DIAGNOSIS — N186 End stage renal disease: Secondary | ICD-10-CM | POA: Diagnosis not present

## 2018-02-18 DIAGNOSIS — E1129 Type 2 diabetes mellitus with other diabetic kidney complication: Secondary | ICD-10-CM | POA: Diagnosis not present

## 2018-02-18 DIAGNOSIS — D509 Iron deficiency anemia, unspecified: Secondary | ICD-10-CM | POA: Diagnosis not present

## 2018-02-18 DIAGNOSIS — D688 Other specified coagulation defects: Secondary | ICD-10-CM | POA: Diagnosis not present

## 2018-02-18 DIAGNOSIS — D631 Anemia in chronic kidney disease: Secondary | ICD-10-CM | POA: Diagnosis not present

## 2018-02-18 DIAGNOSIS — N2581 Secondary hyperparathyroidism of renal origin: Secondary | ICD-10-CM | POA: Diagnosis not present

## 2018-02-21 DIAGNOSIS — D509 Iron deficiency anemia, unspecified: Secondary | ICD-10-CM | POA: Diagnosis not present

## 2018-02-21 DIAGNOSIS — D688 Other specified coagulation defects: Secondary | ICD-10-CM | POA: Diagnosis not present

## 2018-02-21 DIAGNOSIS — N186 End stage renal disease: Secondary | ICD-10-CM | POA: Diagnosis not present

## 2018-02-21 DIAGNOSIS — N2581 Secondary hyperparathyroidism of renal origin: Secondary | ICD-10-CM | POA: Diagnosis not present

## 2018-02-21 DIAGNOSIS — E1129 Type 2 diabetes mellitus with other diabetic kidney complication: Secondary | ICD-10-CM | POA: Diagnosis not present

## 2018-02-21 DIAGNOSIS — D631 Anemia in chronic kidney disease: Secondary | ICD-10-CM | POA: Diagnosis not present

## 2018-02-23 DIAGNOSIS — D509 Iron deficiency anemia, unspecified: Secondary | ICD-10-CM | POA: Diagnosis not present

## 2018-02-23 DIAGNOSIS — D631 Anemia in chronic kidney disease: Secondary | ICD-10-CM | POA: Diagnosis not present

## 2018-02-23 DIAGNOSIS — D688 Other specified coagulation defects: Secondary | ICD-10-CM | POA: Diagnosis not present

## 2018-02-23 DIAGNOSIS — E1129 Type 2 diabetes mellitus with other diabetic kidney complication: Secondary | ICD-10-CM | POA: Diagnosis not present

## 2018-02-23 DIAGNOSIS — N186 End stage renal disease: Secondary | ICD-10-CM | POA: Diagnosis not present

## 2018-02-23 DIAGNOSIS — N2581 Secondary hyperparathyroidism of renal origin: Secondary | ICD-10-CM | POA: Diagnosis not present

## 2018-02-24 ENCOUNTER — Ambulatory Visit (INDEPENDENT_AMBULATORY_CARE_PROVIDER_SITE_OTHER): Payer: Medicare Other | Admitting: Family Medicine

## 2018-02-24 ENCOUNTER — Other Ambulatory Visit: Payer: Self-pay

## 2018-02-24 ENCOUNTER — Encounter: Payer: Self-pay | Admitting: Family Medicine

## 2018-02-24 VITALS — BP 128/70 | HR 94 | Temp 97.6°F | Ht 62.0 in | Wt 226.0 lb

## 2018-02-24 DIAGNOSIS — G629 Polyneuropathy, unspecified: Secondary | ICD-10-CM

## 2018-02-24 DIAGNOSIS — E1140 Type 2 diabetes mellitus with diabetic neuropathy, unspecified: Secondary | ICD-10-CM | POA: Insufficient documentation

## 2018-02-24 DIAGNOSIS — G608 Other hereditary and idiopathic neuropathies: Secondary | ICD-10-CM

## 2018-02-24 DIAGNOSIS — E118 Type 2 diabetes mellitus with unspecified complications: Secondary | ICD-10-CM

## 2018-02-24 DIAGNOSIS — Z794 Long term (current) use of insulin: Secondary | ICD-10-CM | POA: Diagnosis not present

## 2018-02-24 DIAGNOSIS — N185 Chronic kidney disease, stage 5: Secondary | ICD-10-CM

## 2018-02-24 DIAGNOSIS — E1122 Type 2 diabetes mellitus with diabetic chronic kidney disease: Secondary | ICD-10-CM

## 2018-02-24 LAB — POCT GLYCOSYLATED HEMOGLOBIN (HGB A1C): HbA1c, POC (controlled diabetic range): 8.6 % — AB (ref 0.0–7.0)

## 2018-02-24 MED ORDER — GABAPENTIN 100 MG PO CAPS: 100.0000 mg | ORAL_CAPSULE | Freq: Two times a day (BID) | ORAL | 0 refills | Status: DC

## 2018-02-24 MED ORDER — ONETOUCH DELICA LANCING DEV MISC: 0 refills | Status: DC

## 2018-02-24 MED ORDER — GLUCOSE BLOOD VI STRP: ORAL_STRIP | 12 refills | Status: DC

## 2018-02-24 MED ORDER — ONETOUCH VERIO W/DEVICE KIT: PACK | 0 refills | Status: DC

## 2018-02-24 MED ORDER — ONETOUCH DELICA LANCETS 33G MISC: 0 refills | Status: DC

## 2018-02-24 NOTE — Patient Instructions (Addendum)
It was great seeing you today! We have addressed the following issues today  1. I want you to check your blood sugar every morning before breakfast and two hours after lunch and dinner. I want you to write down the numbers and call me in two weeks.  2. I am starting you on a new medication for the pain in your leg. You can tell me if its helping you in 10 days or so.  3. I made you a appointment with our pharmacist in December to help you further control your diabetes.  If we did any lab work today, and the results require attention, either me or my nurse will get in touch with you. If everything is normal, you will get a letter in mail and a message via . If you don't hear from [place] in two weeks, please give [place] a call. Otherwise, we look forward to seeing you again at your next visit. If you have any questions or concerns before then, please call the clinic at [phone].  Please bring all your medications to every doctors visit  Sign up for My Chart to have easy access to your labs results, and communication with your Primary care physician. Please ask Front Desk for some assistance.   Please check-out at the front desk before leaving the clinic.    Take Care,   Dr. Andy Gauss

## 2018-02-24 NOTE — Assessment & Plan Note (Signed)
A1c today is 8.6 down from 9.6 back in June.  Patient reports that she has been using Lantus 30 to 35 units daily.  She does report dietary indiscretion.  A1c has gradually increased over the past 2 years.  She has been unable to check blood glucose at home due to faulty meter.  Discussed with patient need to improve glycemic control. --We will increase Lantus to 40 units daily. --New glucometer kit has been ordered for her.  She will check blood glucose in the morning and after lunch and dinner.  Patient will call with results for further insulin titration.  Given age we will hold off on fast acting.  Could benefit from a GLP-1 or SGLT2 given history of heart failure and ESRD. --Discussed making dietary changes. --Follow-up appointment has been made with Dr. Valentina Lucks on 03/31/2018.

## 2018-02-24 NOTE — Progress Notes (Signed)
Subjective:    Patient ID: Rachel Hobbs, female    DOB: [birth year], 77 y.o.   MRN: [id number]   CC: Lower extremity burning and pain  HPI: Patient is a 77 year old female with a past medical history significant for ESRD on dialysis, type 2 diabetes, hypertension, hyperlipidemia, congestive heart failure who presents today complaining of lower extremity pain burning and tingling.  Patient reports that symptoms are worse at night when she tries to go to sleep.  She denies any prior similar presentation.  Patient reports her symptoms have worsened in the past month and a half.  She was told by her nephrologist that there is a cream that she may be able to buy to help control symptoms.  She continues to use Lantus 30-35 units daily.  She still reports having "weakness for sweets".  She is otherwise doing well.  Smoking status reviewed   ROS: all other systems were reviewed and are negative other than in the HPI   Past Medical History:  Diagnosis Date  . Arthritis   . CHF (congestive heart failure) ([place])   . Depression   . Diabetes mellitus    type 2  . Hernia   . Hyperlipidemia   . Hypertension   . Iron deficiency anemia   . Low iron   . Peripheral vascular disease (HCC)    in legs  . Pneumonia    teenager  . Renal disorder    CKD - dialysis T/TH/Sa  . Shortness of breath dyspnea    with exertion    Past Surgical History:  Procedure Laterality Date  . ABDOMINAL HYSTERECTOMY     in the 70's  . AV FISTULA PLACEMENT Left 12/05/2015   Procedure: RADIOCEPHALIC VERSUS BRACHIOCEPHALIC ARTERIOVENOUS (AV) FISTULA CREATION;  Surgeon: [name], MD;  Location: [facility] OR;  Service: Vascular;  Laterality: Left;  . AV FISTULA PLACEMENT Left 05/20/2017   Procedure: INSERTION OF ARTERIOVENOUS (AV) GORE-TEX VASCULAR STRETCH 4-7 GRAFT ARM LEFT UPPER ARM;  Surgeon: [name], MD;  Location: [facility];  Service: Vascular;  Laterality: Left;  . BASCILIC VEIN TRANSPOSITION Left 06/04/2016   Procedure: FIRST STAGE BASILIC VEIN TRANSPOSITION;  Surgeon: [name], MD;  Location: [place];  Service: Vascular;  Laterality: Left;  . BASCILIC VEIN TRANSPOSITION Left 08/04/2016   Procedure: LEFT UPPER ARM [place], SECOND STAGE;  Surgeon: [name], MD;  Location: [place];  Service: Vascular;  Laterality: Left;  . HERNIA REPAIR     umbilical in the 09'T  . INSERTION OF DIALYSIS CATHETER Right 12/05/2015   Procedure: INSERTION OF DIALYSIS CATHETER;  Surgeon: [name], MD;  Location: [facility] OR;  Service: Vascular;  Laterality: Right;    Past medical history, surgical, family, and social history reviewed and updated in the EMR as appropriate.  Objective:  BP 128/70   Pulse 94   Temp 97.6 F (36.4 C) (Oral)   Ht 5' 2"  (1.575 m)   Wt 226 lb (102.5 kg)   SpO2 99%   BMI 41.34 kg/m   Vitals and nursing note reviewed  General: NAD, pleasant, able to participate in exam Cardiac: RRR, normal heart sounds, no murmurs. 2+ radial and PT pulses bilaterally Respiratory: CTAB, normal effort, No wheezes, rales or rhonchi Abdomen: soft, nontender, nondistended, no hepatic or splenomegaly, +BS Extremities: no edema or cyanosis. WWP. Skin: warm and dry, no rashes noted Neuro: alert and oriented x4, no focal deficits Psych: Normal affect and mood  Assessment & Plan:    Type 2 diabetes mellitus with stage 5 chronic kidney disease not on chronic dialysis, with long-term current use of insulin (HCC) A1c today is 8.6 down from 9.6 back in June.  Patient reports that she has been using Lantus 30 to 35 units daily.  She does report dietary indiscretion.  A1c has gradually increased over the past 2 years.  She has been unable to check blood glucose at home due to faulty meter.  Discussed with patient need to improve glycemic control. --We will increase Lantus to 40 units daily. --New glucometer kit has been ordered for her.  She will check blood glucose in the morning  and after lunch and dinner.  Patient will call with results for further insulin titration.  Given age we will hold off on fast acting.  Could benefit from a GLP-1 or SGLT2 given history of heart failure and ESRD. --Discussed making dietary changes. --Follow-up appointment has been made with Dr. Valentina Lucks on 03/31/2018.  Peripheral sensory neuropathy Patient reports burning and tingling for the past 6 weeks.  Denies any similar symptoms.  Has minimal swelling.  Based on symptom description patient history likely peripheral neuropathy from uncontrolled diabetes.  Given patient ESRD will start on renally dose gabapentin.  Could also consider amitriptyline and nortriptyline although should be mindful of anticholinergic effect in elderly patient.  Will reinforce need for improved glycemic control. --Start patient on gabapentin 100 mg twice daily.  Patient was instructed to take 1 pill in the morning and 1 pain evening if symptoms do not improve she can take both pills and evening when she has worsening symptoms.  Will reevaluate in 4 to 6 weeks.  Could add a booster dose of 125 to 300 mg 4 hours after dialysis as needed.  Will hold off for for the time being on additional dose.    Marjie Skiff, MD [place] PGY-3

## 2018-02-24 NOTE — Assessment & Plan Note (Signed)
Patient reports burning and tingling for the past 6 weeks.  Denies any similar symptoms.  Has minimal swelling.  Based on symptom description patient history likely peripheral neuropathy from uncontrolled diabetes.  Given patient ESRD will start on renally dose gabapentin.  Could also consider amitriptyline and nortriptyline although should be mindful of anticholinergic effect in elderly patient.  Will reinforce need for improved glycemic control. --Start patient on gabapentin 100 mg twice daily.  Patient was instructed to take 1 pill in the morning and 1 pain evening if symptoms do not improve she can take both pills and evening when she has worsening symptoms.  Will reevaluate in 4 to 6 weeks.  Could add a booster dose of 125 to 300 mg 4 hours after dialysis as needed.  Will hold off for for the time being on additional dose.

## 2018-02-25 DIAGNOSIS — E1129 Type 2 diabetes mellitus with other diabetic kidney complication: Secondary | ICD-10-CM | POA: Diagnosis not present

## 2018-02-25 DIAGNOSIS — D509 Iron deficiency anemia, unspecified: Secondary | ICD-10-CM | POA: Diagnosis not present

## 2018-02-25 DIAGNOSIS — D631 Anemia in chronic kidney disease: Secondary | ICD-10-CM | POA: Diagnosis not present

## 2018-02-25 DIAGNOSIS — N2581 Secondary hyperparathyroidism of renal origin: Secondary | ICD-10-CM | POA: Diagnosis not present

## 2018-02-25 DIAGNOSIS — D688 Other specified coagulation defects: Secondary | ICD-10-CM | POA: Diagnosis not present

## 2018-02-25 DIAGNOSIS — N186 End stage renal disease: Secondary | ICD-10-CM | POA: Diagnosis not present

## 2018-02-28 DIAGNOSIS — N2581 Secondary hyperparathyroidism of renal origin: Secondary | ICD-10-CM | POA: Diagnosis not present

## 2018-02-28 DIAGNOSIS — E1129 Type 2 diabetes mellitus with other diabetic kidney complication: Secondary | ICD-10-CM | POA: Diagnosis not present

## 2018-02-28 DIAGNOSIS — D688 Other specified coagulation defects: Secondary | ICD-10-CM | POA: Diagnosis not present

## 2018-02-28 DIAGNOSIS — D631 Anemia in chronic kidney disease: Secondary | ICD-10-CM | POA: Diagnosis not present

## 2018-02-28 DIAGNOSIS — N186 End stage renal disease: Secondary | ICD-10-CM | POA: Diagnosis not present

## 2018-02-28 DIAGNOSIS — D509 Iron deficiency anemia, unspecified: Secondary | ICD-10-CM | POA: Diagnosis not present

## 2018-03-02 DIAGNOSIS — D509 Iron deficiency anemia, unspecified: Secondary | ICD-10-CM | POA: Diagnosis not present

## 2018-03-02 DIAGNOSIS — N186 End stage renal disease: Secondary | ICD-10-CM | POA: Diagnosis not present

## 2018-03-02 DIAGNOSIS — N2581 Secondary hyperparathyroidism of renal origin: Secondary | ICD-10-CM | POA: Diagnosis not present

## 2018-03-02 DIAGNOSIS — D688 Other specified coagulation defects: Secondary | ICD-10-CM | POA: Diagnosis not present

## 2018-03-02 DIAGNOSIS — E1129 Type 2 diabetes mellitus with other diabetic kidney complication: Secondary | ICD-10-CM | POA: Diagnosis not present

## 2018-03-02 DIAGNOSIS — D631 Anemia in chronic kidney disease: Secondary | ICD-10-CM | POA: Diagnosis not present

## 2018-03-04 DIAGNOSIS — D631 Anemia in chronic kidney disease: Secondary | ICD-10-CM | POA: Diagnosis not present

## 2018-03-04 DIAGNOSIS — D509 Iron deficiency anemia, unspecified: Secondary | ICD-10-CM | POA: Diagnosis not present

## 2018-03-04 DIAGNOSIS — N2581 Secondary hyperparathyroidism of renal origin: Secondary | ICD-10-CM | POA: Diagnosis not present

## 2018-03-04 DIAGNOSIS — D688 Other specified coagulation defects: Secondary | ICD-10-CM | POA: Diagnosis not present

## 2018-03-04 DIAGNOSIS — N186 End stage renal disease: Secondary | ICD-10-CM | POA: Diagnosis not present

## 2018-03-04 DIAGNOSIS — E1129 Type 2 diabetes mellitus with other diabetic kidney complication: Secondary | ICD-10-CM | POA: Diagnosis not present

## 2018-03-07 DIAGNOSIS — D688 Other specified coagulation defects: Secondary | ICD-10-CM | POA: Diagnosis not present

## 2018-03-07 DIAGNOSIS — N186 End stage renal disease: Secondary | ICD-10-CM | POA: Diagnosis not present

## 2018-03-07 DIAGNOSIS — D509 Iron deficiency anemia, unspecified: Secondary | ICD-10-CM | POA: Diagnosis not present

## 2018-03-07 DIAGNOSIS — E1129 Type 2 diabetes mellitus with other diabetic kidney complication: Secondary | ICD-10-CM | POA: Diagnosis not present

## 2018-03-07 DIAGNOSIS — D631 Anemia in chronic kidney disease: Secondary | ICD-10-CM | POA: Diagnosis not present

## 2018-03-07 DIAGNOSIS — N2581 Secondary hyperparathyroidism of renal origin: Secondary | ICD-10-CM | POA: Diagnosis not present

## 2018-03-09 DIAGNOSIS — N2581 Secondary hyperparathyroidism of renal origin: Secondary | ICD-10-CM | POA: Diagnosis not present

## 2018-03-09 DIAGNOSIS — E1129 Type 2 diabetes mellitus with other diabetic kidney complication: Secondary | ICD-10-CM | POA: Diagnosis not present

## 2018-03-09 DIAGNOSIS — D688 Other specified coagulation defects: Secondary | ICD-10-CM | POA: Diagnosis not present

## 2018-03-09 DIAGNOSIS — N186 End stage renal disease: Secondary | ICD-10-CM | POA: Diagnosis not present

## 2018-03-09 DIAGNOSIS — D509 Iron deficiency anemia, unspecified: Secondary | ICD-10-CM | POA: Diagnosis not present

## 2018-03-09 DIAGNOSIS — D631 Anemia in chronic kidney disease: Secondary | ICD-10-CM | POA: Diagnosis not present

## 2018-03-11 DIAGNOSIS — D631 Anemia in chronic kidney disease: Secondary | ICD-10-CM | POA: Diagnosis not present

## 2018-03-11 DIAGNOSIS — E1129 Type 2 diabetes mellitus with other diabetic kidney complication: Secondary | ICD-10-CM | POA: Diagnosis not present

## 2018-03-11 DIAGNOSIS — D509 Iron deficiency anemia, unspecified: Secondary | ICD-10-CM | POA: Diagnosis not present

## 2018-03-11 DIAGNOSIS — N2581 Secondary hyperparathyroidism of renal origin: Secondary | ICD-10-CM | POA: Diagnosis not present

## 2018-03-11 DIAGNOSIS — D688 Other specified coagulation defects: Secondary | ICD-10-CM | POA: Diagnosis not present

## 2018-03-11 DIAGNOSIS — N186 End stage renal disease: Secondary | ICD-10-CM | POA: Diagnosis not present

## 2018-03-13 DIAGNOSIS — N186 End stage renal disease: Secondary | ICD-10-CM | POA: Diagnosis not present

## 2018-03-13 DIAGNOSIS — D631 Anemia in chronic kidney disease: Secondary | ICD-10-CM | POA: Diagnosis not present

## 2018-03-13 DIAGNOSIS — N2581 Secondary hyperparathyroidism of renal origin: Secondary | ICD-10-CM | POA: Diagnosis not present

## 2018-03-13 DIAGNOSIS — E1129 Type 2 diabetes mellitus with other diabetic kidney complication: Secondary | ICD-10-CM | POA: Diagnosis not present

## 2018-03-13 DIAGNOSIS — D509 Iron deficiency anemia, unspecified: Secondary | ICD-10-CM | POA: Diagnosis not present

## 2018-03-13 DIAGNOSIS — D688 Other specified coagulation defects: Secondary | ICD-10-CM | POA: Diagnosis not present

## 2018-03-15 DIAGNOSIS — N2581 Secondary hyperparathyroidism of renal origin: Secondary | ICD-10-CM | POA: Diagnosis not present

## 2018-03-15 DIAGNOSIS — E1129 Type 2 diabetes mellitus with other diabetic kidney complication: Secondary | ICD-10-CM | POA: Diagnosis not present

## 2018-03-15 DIAGNOSIS — N186 End stage renal disease: Secondary | ICD-10-CM | POA: Diagnosis not present

## 2018-03-15 DIAGNOSIS — D688 Other specified coagulation defects: Secondary | ICD-10-CM | POA: Diagnosis not present

## 2018-03-15 DIAGNOSIS — D631 Anemia in chronic kidney disease: Secondary | ICD-10-CM | POA: Diagnosis not present

## 2018-03-15 DIAGNOSIS — D509 Iron deficiency anemia, unspecified: Secondary | ICD-10-CM | POA: Diagnosis not present

## 2018-03-18 DIAGNOSIS — N2581 Secondary hyperparathyroidism of renal origin: Secondary | ICD-10-CM | POA: Diagnosis not present

## 2018-03-18 DIAGNOSIS — Z992 Dependence on renal dialysis: Secondary | ICD-10-CM | POA: Diagnosis not present

## 2018-03-18 DIAGNOSIS — D509 Iron deficiency anemia, unspecified: Secondary | ICD-10-CM | POA: Diagnosis not present

## 2018-03-18 DIAGNOSIS — E1129 Type 2 diabetes mellitus with other diabetic kidney complication: Secondary | ICD-10-CM | POA: Diagnosis not present

## 2018-03-18 DIAGNOSIS — N186 End stage renal disease: Secondary | ICD-10-CM | POA: Diagnosis not present

## 2018-03-18 DIAGNOSIS — D688 Other specified coagulation defects: Secondary | ICD-10-CM | POA: Diagnosis not present

## 2018-03-18 DIAGNOSIS — D631 Anemia in chronic kidney disease: Secondary | ICD-10-CM | POA: Diagnosis not present

## 2018-03-21 DIAGNOSIS — D509 Iron deficiency anemia, unspecified: Secondary | ICD-10-CM | POA: Diagnosis not present

## 2018-03-21 DIAGNOSIS — D631 Anemia in chronic kidney disease: Secondary | ICD-10-CM | POA: Diagnosis not present

## 2018-03-21 DIAGNOSIS — N186 End stage renal disease: Secondary | ICD-10-CM | POA: Diagnosis not present

## 2018-03-21 DIAGNOSIS — N2581 Secondary hyperparathyroidism of renal origin: Secondary | ICD-10-CM | POA: Diagnosis not present

## 2018-03-21 DIAGNOSIS — R51 Headache: Secondary | ICD-10-CM | POA: Diagnosis not present

## 2018-03-21 DIAGNOSIS — E1129 Type 2 diabetes mellitus with other diabetic kidney complication: Secondary | ICD-10-CM | POA: Diagnosis not present

## 2018-03-21 DIAGNOSIS — D688 Other specified coagulation defects: Secondary | ICD-10-CM | POA: Diagnosis not present

## 2018-03-23 DIAGNOSIS — D688 Other specified coagulation defects: Secondary | ICD-10-CM | POA: Diagnosis not present

## 2018-03-23 DIAGNOSIS — D631 Anemia in chronic kidney disease: Secondary | ICD-10-CM | POA: Diagnosis not present

## 2018-03-23 DIAGNOSIS — N186 End stage renal disease: Secondary | ICD-10-CM | POA: Diagnosis not present

## 2018-03-23 DIAGNOSIS — R51 Headache: Secondary | ICD-10-CM | POA: Diagnosis not present

## 2018-03-23 DIAGNOSIS — N2581 Secondary hyperparathyroidism of renal origin: Secondary | ICD-10-CM | POA: Diagnosis not present

## 2018-03-23 DIAGNOSIS — E1129 Type 2 diabetes mellitus with other diabetic kidney complication: Secondary | ICD-10-CM | POA: Diagnosis not present

## 2018-03-23 DIAGNOSIS — D509 Iron deficiency anemia, unspecified: Secondary | ICD-10-CM | POA: Diagnosis not present

## 2018-03-24 ENCOUNTER — Ambulatory Visit: Payer: Medicare Other

## 2018-03-25 DIAGNOSIS — R51 Headache: Secondary | ICD-10-CM | POA: Diagnosis not present

## 2018-03-25 DIAGNOSIS — D688 Other specified coagulation defects: Secondary | ICD-10-CM | POA: Diagnosis not present

## 2018-03-25 DIAGNOSIS — E1129 Type 2 diabetes mellitus with other diabetic kidney complication: Secondary | ICD-10-CM | POA: Diagnosis not present

## 2018-03-25 DIAGNOSIS — N186 End stage renal disease: Secondary | ICD-10-CM | POA: Diagnosis not present

## 2018-03-25 DIAGNOSIS — D631 Anemia in chronic kidney disease: Secondary | ICD-10-CM | POA: Diagnosis not present

## 2018-03-25 DIAGNOSIS — D509 Iron deficiency anemia, unspecified: Secondary | ICD-10-CM | POA: Diagnosis not present

## 2018-03-25 DIAGNOSIS — N2581 Secondary hyperparathyroidism of renal origin: Secondary | ICD-10-CM | POA: Diagnosis not present

## 2018-03-28 DIAGNOSIS — D509 Iron deficiency anemia, unspecified: Secondary | ICD-10-CM | POA: Diagnosis not present

## 2018-03-28 DIAGNOSIS — R51 Headache: Secondary | ICD-10-CM | POA: Diagnosis not present

## 2018-03-28 DIAGNOSIS — N2581 Secondary hyperparathyroidism of renal origin: Secondary | ICD-10-CM | POA: Diagnosis not present

## 2018-03-28 DIAGNOSIS — D688 Other specified coagulation defects: Secondary | ICD-10-CM | POA: Diagnosis not present

## 2018-03-28 DIAGNOSIS — D631 Anemia in chronic kidney disease: Secondary | ICD-10-CM | POA: Diagnosis not present

## 2018-03-28 DIAGNOSIS — E1129 Type 2 diabetes mellitus with other diabetic kidney complication: Secondary | ICD-10-CM | POA: Diagnosis not present

## 2018-03-28 DIAGNOSIS — N186 End stage renal disease: Secondary | ICD-10-CM | POA: Diagnosis not present

## 2018-03-30 DIAGNOSIS — R51 Headache: Secondary | ICD-10-CM | POA: Diagnosis not present

## 2018-03-30 DIAGNOSIS — E1129 Type 2 diabetes mellitus with other diabetic kidney complication: Secondary | ICD-10-CM | POA: Diagnosis not present

## 2018-03-30 DIAGNOSIS — N186 End stage renal disease: Secondary | ICD-10-CM | POA: Diagnosis not present

## 2018-03-30 DIAGNOSIS — D631 Anemia in chronic kidney disease: Secondary | ICD-10-CM | POA: Diagnosis not present

## 2018-03-30 DIAGNOSIS — D688 Other specified coagulation defects: Secondary | ICD-10-CM | POA: Diagnosis not present

## 2018-03-30 DIAGNOSIS — N2581 Secondary hyperparathyroidism of renal origin: Secondary | ICD-10-CM | POA: Diagnosis not present

## 2018-03-30 DIAGNOSIS — D509 Iron deficiency anemia, unspecified: Secondary | ICD-10-CM | POA: Diagnosis not present

## 2018-03-31 ENCOUNTER — Ambulatory Visit: Payer: Medicare Other | Admitting: Pharmacist

## 2018-03-31 ENCOUNTER — Telehealth: Payer: Self-pay | Admitting: Family Medicine

## 2018-03-31 NOTE — Telephone Encounter (Signed)
Follow up call per patient request after she was unable to come to her appointment today.  She reports her blood sugars are 150-200 most of the time with some readings 200-250 when she eats the wrong things.  We agreed to make no changes in her insulin regimen of Lantus 35 units once daily at this time.   She also reports twitching and numbness in her legs.  She believes the twitching may be due to the gabapentin.  She is on 100mg  BID.  I asked her to stop the gabapentin until she is contacted on Monday.  Plan to discuss neuropathy options with PCP - Dr. Andy Gauss and follow/up on Monday 12/16

## 2018-03-31 NOTE — Telephone Encounter (Signed)
Pt called to reschedule her appointment for today because of the weather. She has rescheduled the first available which is 04/27/2018. She would like to speak to Dr. Valentina Lucks if possible before then since she was unable to come today. jw

## 2018-04-01 DIAGNOSIS — D688 Other specified coagulation defects: Secondary | ICD-10-CM | POA: Diagnosis not present

## 2018-04-01 DIAGNOSIS — N186 End stage renal disease: Secondary | ICD-10-CM | POA: Diagnosis not present

## 2018-04-01 DIAGNOSIS — R51 Headache: Secondary | ICD-10-CM | POA: Diagnosis not present

## 2018-04-01 DIAGNOSIS — D509 Iron deficiency anemia, unspecified: Secondary | ICD-10-CM | POA: Diagnosis not present

## 2018-04-01 DIAGNOSIS — E1129 Type 2 diabetes mellitus with other diabetic kidney complication: Secondary | ICD-10-CM | POA: Diagnosis not present

## 2018-04-01 DIAGNOSIS — N2581 Secondary hyperparathyroidism of renal origin: Secondary | ICD-10-CM | POA: Diagnosis not present

## 2018-04-01 DIAGNOSIS — D631 Anemia in chronic kidney disease: Secondary | ICD-10-CM | POA: Diagnosis not present

## 2018-04-03 ENCOUNTER — Telehealth: Payer: Self-pay | Admitting: Pharmacist

## 2018-04-03 NOTE — Telephone Encounter (Signed)
Called in follow- up of plan to stop gabapentin to evaluate if twitching in toes and legs was resolved by stopping gabapentin.   Patient reports continues symptoms with no reduction in severity.   Asked her to continue HOLD the gabapentin until f/u from our office.   I did not get to talk with Dr. Andy Gauss this AM and patient did NOT get relief of twitching from stopping the gabapentin.  Potential plans could include increasing gabapentin or switching agents.    Side NOTE:  Patient reports blood sugars are doing better with current insulin regimen.

## 2018-04-04 DIAGNOSIS — D631 Anemia in chronic kidney disease: Secondary | ICD-10-CM | POA: Diagnosis not present

## 2018-04-04 DIAGNOSIS — D509 Iron deficiency anemia, unspecified: Secondary | ICD-10-CM | POA: Diagnosis not present

## 2018-04-04 DIAGNOSIS — N186 End stage renal disease: Secondary | ICD-10-CM | POA: Diagnosis not present

## 2018-04-04 DIAGNOSIS — N2581 Secondary hyperparathyroidism of renal origin: Secondary | ICD-10-CM | POA: Diagnosis not present

## 2018-04-04 DIAGNOSIS — E1129 Type 2 diabetes mellitus with other diabetic kidney complication: Secondary | ICD-10-CM | POA: Diagnosis not present

## 2018-04-04 DIAGNOSIS — R51 Headache: Secondary | ICD-10-CM | POA: Diagnosis not present

## 2018-04-04 DIAGNOSIS — D688 Other specified coagulation defects: Secondary | ICD-10-CM | POA: Diagnosis not present

## 2018-04-06 DIAGNOSIS — D631 Anemia in chronic kidney disease: Secondary | ICD-10-CM | POA: Diagnosis not present

## 2018-04-06 DIAGNOSIS — N186 End stage renal disease: Secondary | ICD-10-CM | POA: Diagnosis not present

## 2018-04-06 DIAGNOSIS — D688 Other specified coagulation defects: Secondary | ICD-10-CM | POA: Diagnosis not present

## 2018-04-06 DIAGNOSIS — D509 Iron deficiency anemia, unspecified: Secondary | ICD-10-CM | POA: Diagnosis not present

## 2018-04-06 DIAGNOSIS — R51 Headache: Secondary | ICD-10-CM | POA: Diagnosis not present

## 2018-04-06 DIAGNOSIS — E1129 Type 2 diabetes mellitus with other diabetic kidney complication: Secondary | ICD-10-CM | POA: Diagnosis not present

## 2018-04-06 DIAGNOSIS — N2581 Secondary hyperparathyroidism of renal origin: Secondary | ICD-10-CM | POA: Diagnosis not present

## 2018-04-08 DIAGNOSIS — R51 Headache: Secondary | ICD-10-CM | POA: Diagnosis not present

## 2018-04-08 DIAGNOSIS — E1129 Type 2 diabetes mellitus with other diabetic kidney complication: Secondary | ICD-10-CM | POA: Diagnosis not present

## 2018-04-08 DIAGNOSIS — N2581 Secondary hyperparathyroidism of renal origin: Secondary | ICD-10-CM | POA: Diagnosis not present

## 2018-04-08 DIAGNOSIS — N186 End stage renal disease: Secondary | ICD-10-CM | POA: Diagnosis not present

## 2018-04-08 DIAGNOSIS — D509 Iron deficiency anemia, unspecified: Secondary | ICD-10-CM | POA: Diagnosis not present

## 2018-04-08 DIAGNOSIS — D631 Anemia in chronic kidney disease: Secondary | ICD-10-CM | POA: Diagnosis not present

## 2018-04-08 DIAGNOSIS — D688 Other specified coagulation defects: Secondary | ICD-10-CM | POA: Diagnosis not present

## 2018-04-10 DIAGNOSIS — R51 Headache: Secondary | ICD-10-CM | POA: Diagnosis not present

## 2018-04-10 DIAGNOSIS — E1129 Type 2 diabetes mellitus with other diabetic kidney complication: Secondary | ICD-10-CM | POA: Diagnosis not present

## 2018-04-10 DIAGNOSIS — N2581 Secondary hyperparathyroidism of renal origin: Secondary | ICD-10-CM | POA: Diagnosis not present

## 2018-04-10 DIAGNOSIS — D509 Iron deficiency anemia, unspecified: Secondary | ICD-10-CM | POA: Diagnosis not present

## 2018-04-10 DIAGNOSIS — D631 Anemia in chronic kidney disease: Secondary | ICD-10-CM | POA: Diagnosis not present

## 2018-04-10 DIAGNOSIS — D688 Other specified coagulation defects: Secondary | ICD-10-CM | POA: Diagnosis not present

## 2018-04-10 DIAGNOSIS — N186 End stage renal disease: Secondary | ICD-10-CM | POA: Diagnosis not present

## 2018-04-13 DIAGNOSIS — N186 End stage renal disease: Secondary | ICD-10-CM | POA: Diagnosis not present

## 2018-04-13 DIAGNOSIS — D688 Other specified coagulation defects: Secondary | ICD-10-CM | POA: Diagnosis not present

## 2018-04-13 DIAGNOSIS — N2581 Secondary hyperparathyroidism of renal origin: Secondary | ICD-10-CM | POA: Diagnosis not present

## 2018-04-13 DIAGNOSIS — D631 Anemia in chronic kidney disease: Secondary | ICD-10-CM | POA: Diagnosis not present

## 2018-04-13 DIAGNOSIS — E1129 Type 2 diabetes mellitus with other diabetic kidney complication: Secondary | ICD-10-CM | POA: Diagnosis not present

## 2018-04-13 DIAGNOSIS — D509 Iron deficiency anemia, unspecified: Secondary | ICD-10-CM | POA: Diagnosis not present

## 2018-04-13 DIAGNOSIS — R51 Headache: Secondary | ICD-10-CM | POA: Diagnosis not present

## 2018-04-15 DIAGNOSIS — D688 Other specified coagulation defects: Secondary | ICD-10-CM | POA: Diagnosis not present

## 2018-04-15 DIAGNOSIS — D509 Iron deficiency anemia, unspecified: Secondary | ICD-10-CM | POA: Diagnosis not present

## 2018-04-15 DIAGNOSIS — R51 Headache: Secondary | ICD-10-CM | POA: Diagnosis not present

## 2018-04-15 DIAGNOSIS — N2581 Secondary hyperparathyroidism of renal origin: Secondary | ICD-10-CM | POA: Diagnosis not present

## 2018-04-15 DIAGNOSIS — N186 End stage renal disease: Secondary | ICD-10-CM | POA: Diagnosis not present

## 2018-04-15 DIAGNOSIS — D631 Anemia in chronic kidney disease: Secondary | ICD-10-CM | POA: Diagnosis not present

## 2018-04-15 DIAGNOSIS — E1129 Type 2 diabetes mellitus with other diabetic kidney complication: Secondary | ICD-10-CM | POA: Diagnosis not present

## 2018-04-17 ENCOUNTER — Telehealth: Payer: Self-pay | Admitting: Family Medicine

## 2018-04-17 DIAGNOSIS — D509 Iron deficiency anemia, unspecified: Secondary | ICD-10-CM | POA: Diagnosis not present

## 2018-04-17 DIAGNOSIS — E1129 Type 2 diabetes mellitus with other diabetic kidney complication: Secondary | ICD-10-CM | POA: Diagnosis not present

## 2018-04-17 DIAGNOSIS — D688 Other specified coagulation defects: Secondary | ICD-10-CM | POA: Diagnosis not present

## 2018-04-17 DIAGNOSIS — D631 Anemia in chronic kidney disease: Secondary | ICD-10-CM | POA: Diagnosis not present

## 2018-04-17 DIAGNOSIS — N2581 Secondary hyperparathyroidism of renal origin: Secondary | ICD-10-CM | POA: Diagnosis not present

## 2018-04-17 DIAGNOSIS — R51 Headache: Secondary | ICD-10-CM | POA: Diagnosis not present

## 2018-04-17 DIAGNOSIS — N186 End stage renal disease: Secondary | ICD-10-CM | POA: Diagnosis not present

## 2018-04-17 NOTE — Telephone Encounter (Signed)
Pt called to reschedule her appointment with Dr. Valentina Lucks due to not being able to come on thursdays because of dialysis. Pt would like for Dr. Andy Gauss to call her to discuss some issues with her medications.

## 2018-04-18 DIAGNOSIS — E1129 Type 2 diabetes mellitus with other diabetic kidney complication: Secondary | ICD-10-CM | POA: Diagnosis not present

## 2018-04-18 DIAGNOSIS — Z992 Dependence on renal dialysis: Secondary | ICD-10-CM | POA: Diagnosis not present

## 2018-04-18 DIAGNOSIS — N186 End stage renal disease: Secondary | ICD-10-CM | POA: Diagnosis not present

## 2018-04-20 DIAGNOSIS — R51 Headache: Secondary | ICD-10-CM | POA: Diagnosis not present

## 2018-04-20 DIAGNOSIS — N186 End stage renal disease: Secondary | ICD-10-CM | POA: Diagnosis not present

## 2018-04-20 DIAGNOSIS — D509 Iron deficiency anemia, unspecified: Secondary | ICD-10-CM | POA: Diagnosis not present

## 2018-04-20 DIAGNOSIS — D631 Anemia in chronic kidney disease: Secondary | ICD-10-CM | POA: Diagnosis not present

## 2018-04-20 DIAGNOSIS — N2581 Secondary hyperparathyroidism of renal origin: Secondary | ICD-10-CM | POA: Diagnosis not present

## 2018-04-20 DIAGNOSIS — E1129 Type 2 diabetes mellitus with other diabetic kidney complication: Secondary | ICD-10-CM | POA: Diagnosis not present

## 2018-04-20 DIAGNOSIS — D688 Other specified coagulation defects: Secondary | ICD-10-CM | POA: Diagnosis not present

## 2018-04-20 NOTE — Telephone Encounter (Signed)
Tried to call patient back to get some more information but was unable to reach her.  Will try again later today.  Jazmin Hartsell,CMA

## 2018-04-21 NOTE — Telephone Encounter (Signed)
Patient informed and voiced understanding.  Pike Scantlebury,CMA  

## 2018-04-21 NOTE — Telephone Encounter (Signed)
Spoke with patient and she states that she was advised to stop the gabapentin on 04-03-18 by Dr. Valentina Lucks.  There is a phone note from this date and patient would like to know what she needs to take in its place now.  Will forward to MD. [name]

## 2018-04-21 NOTE — Telephone Encounter (Signed)
Please try calling the patient again about her meds, as she is home today, [phone].

## 2018-04-21 NOTE — Telephone Encounter (Signed)
LM for patient to call back.  Please ask what concern she has regarding her medications.  Thanks [employer]

## 2018-04-21 NOTE — Telephone Encounter (Signed)
Patient can resume 100 mg bid of gabapentin and let us know if she notices any improvement.  Thanks  Marjie Skiff, MD [place], PGY-3

## 2018-04-22 DIAGNOSIS — D631 Anemia in chronic kidney disease: Secondary | ICD-10-CM | POA: Diagnosis not present

## 2018-04-22 DIAGNOSIS — E1129 Type 2 diabetes mellitus with other diabetic kidney complication: Secondary | ICD-10-CM | POA: Diagnosis not present

## 2018-04-22 DIAGNOSIS — D688 Other specified coagulation defects: Secondary | ICD-10-CM | POA: Diagnosis not present

## 2018-04-22 DIAGNOSIS — D509 Iron deficiency anemia, unspecified: Secondary | ICD-10-CM | POA: Diagnosis not present

## 2018-04-22 DIAGNOSIS — N2581 Secondary hyperparathyroidism of renal origin: Secondary | ICD-10-CM | POA: Diagnosis not present

## 2018-04-22 DIAGNOSIS — N186 End stage renal disease: Secondary | ICD-10-CM | POA: Diagnosis not present

## 2018-04-22 DIAGNOSIS — R51 Headache: Secondary | ICD-10-CM | POA: Diagnosis not present

## 2018-04-25 ENCOUNTER — Ambulatory Visit: Payer: Medicare Other

## 2018-04-25 DIAGNOSIS — N186 End stage renal disease: Secondary | ICD-10-CM | POA: Diagnosis not present

## 2018-04-25 DIAGNOSIS — R51 Headache: Secondary | ICD-10-CM | POA: Diagnosis not present

## 2018-04-25 DIAGNOSIS — D688 Other specified coagulation defects: Secondary | ICD-10-CM | POA: Diagnosis not present

## 2018-04-25 DIAGNOSIS — E1129 Type 2 diabetes mellitus with other diabetic kidney complication: Secondary | ICD-10-CM | POA: Diagnosis not present

## 2018-04-25 DIAGNOSIS — N2581 Secondary hyperparathyroidism of renal origin: Secondary | ICD-10-CM | POA: Diagnosis not present

## 2018-04-25 DIAGNOSIS — D509 Iron deficiency anemia, unspecified: Secondary | ICD-10-CM | POA: Diagnosis not present

## 2018-04-25 DIAGNOSIS — D631 Anemia in chronic kidney disease: Secondary | ICD-10-CM | POA: Diagnosis not present

## 2018-04-27 ENCOUNTER — Ambulatory Visit: Payer: Medicare Other | Admitting: Pharmacist

## 2018-04-27 DIAGNOSIS — E1129 Type 2 diabetes mellitus with other diabetic kidney complication: Secondary | ICD-10-CM | POA: Diagnosis not present

## 2018-04-27 DIAGNOSIS — N186 End stage renal disease: Secondary | ICD-10-CM | POA: Diagnosis not present

## 2018-04-27 DIAGNOSIS — R51 Headache: Secondary | ICD-10-CM | POA: Diagnosis not present

## 2018-04-27 DIAGNOSIS — D509 Iron deficiency anemia, unspecified: Secondary | ICD-10-CM | POA: Diagnosis not present

## 2018-04-27 DIAGNOSIS — D688 Other specified coagulation defects: Secondary | ICD-10-CM | POA: Diagnosis not present

## 2018-04-27 DIAGNOSIS — D631 Anemia in chronic kidney disease: Secondary | ICD-10-CM | POA: Diagnosis not present

## 2018-04-27 DIAGNOSIS — N2581 Secondary hyperparathyroidism of renal origin: Secondary | ICD-10-CM | POA: Diagnosis not present

## 2018-04-29 DIAGNOSIS — D631 Anemia in chronic kidney disease: Secondary | ICD-10-CM | POA: Diagnosis not present

## 2018-04-29 DIAGNOSIS — R51 Headache: Secondary | ICD-10-CM | POA: Diagnosis not present

## 2018-04-29 DIAGNOSIS — N186 End stage renal disease: Secondary | ICD-10-CM | POA: Diagnosis not present

## 2018-04-29 DIAGNOSIS — N2581 Secondary hyperparathyroidism of renal origin: Secondary | ICD-10-CM | POA: Diagnosis not present

## 2018-04-29 DIAGNOSIS — D509 Iron deficiency anemia, unspecified: Secondary | ICD-10-CM | POA: Diagnosis not present

## 2018-04-29 DIAGNOSIS — D688 Other specified coagulation defects: Secondary | ICD-10-CM | POA: Diagnosis not present

## 2018-04-29 DIAGNOSIS — E1129 Type 2 diabetes mellitus with other diabetic kidney complication: Secondary | ICD-10-CM | POA: Diagnosis not present

## 2018-05-02 DIAGNOSIS — T82868A Thrombosis of vascular prosthetic devices, implants and grafts, initial encounter: Secondary | ICD-10-CM | POA: Diagnosis not present

## 2018-05-02 DIAGNOSIS — D688 Other specified coagulation defects: Secondary | ICD-10-CM | POA: Diagnosis not present

## 2018-05-02 DIAGNOSIS — Z992 Dependence on renal dialysis: Secondary | ICD-10-CM | POA: Diagnosis not present

## 2018-05-02 DIAGNOSIS — I871 Compression of vein: Secondary | ICD-10-CM | POA: Diagnosis not present

## 2018-05-02 DIAGNOSIS — D509 Iron deficiency anemia, unspecified: Secondary | ICD-10-CM | POA: Diagnosis not present

## 2018-05-02 DIAGNOSIS — N186 End stage renal disease: Secondary | ICD-10-CM | POA: Diagnosis not present

## 2018-05-02 DIAGNOSIS — E1129 Type 2 diabetes mellitus with other diabetic kidney complication: Secondary | ICD-10-CM | POA: Diagnosis not present

## 2018-05-02 DIAGNOSIS — N2581 Secondary hyperparathyroidism of renal origin: Secondary | ICD-10-CM | POA: Diagnosis not present

## 2018-05-02 DIAGNOSIS — R51 Headache: Secondary | ICD-10-CM | POA: Diagnosis not present

## 2018-05-02 DIAGNOSIS — D631 Anemia in chronic kidney disease: Secondary | ICD-10-CM | POA: Diagnosis not present

## 2018-05-03 ENCOUNTER — Ambulatory Visit (INDEPENDENT_AMBULATORY_CARE_PROVIDER_SITE_OTHER): Payer: Medicare Other

## 2018-05-03 VITALS — BP 120/70 | HR 80 | Temp 98.9°F | Ht 62.0 in | Wt 229.0 lb

## 2018-05-03 DIAGNOSIS — Z0000 Encounter for general adult medical examination without abnormal findings: Secondary | ICD-10-CM | POA: Diagnosis not present

## 2018-05-03 NOTE — Patient Instructions (Addendum)
[name] , Thank you for taking time to come for your Medicare Wellness Visit. I appreciate your ongoing commitment to your health goals. Please review the following plan we discussed and let me know if I can assist you in the future.   Please keep your appointment with Dr. Valentina Lucks on 05/05/18.  These are the goals we discussed: Goals    . Consume at least 2-4 vegetables daily.     . Exercise 2x per week (30 min per time)        This is a list of the screening recommended for you and due dates:  Health Maintenance  Topic Date Due  . DEXA scan (bone density measurement)  05/04/2019*  . Tetanus Vaccine  05/04/2019*  . Pneumonia vaccines (1 of 2 - PCV13) 05/04/2019*  . Hemoglobin A1C  08/25/2018  . Eye exam for diabetics  09/18/2018  . Complete foot exam   02/07/2019  . Flu Shot  Completed  *Topic was postponed. The date shown is not the original due date.     Bone Density Test The bone density test uses a special type of X-ray to measure the amount of calcium and other minerals in your bones. It can measure bone density in the hip and the spine. The test procedure is similar to having a regular X-ray. This test may also be called:  Bone densitometry.  Bone mineral density test.  Dual-energy X-ray absorptiometry (DEXA). You may have this test to:  Diagnose a condition that causes weak or thin bones (osteoporosis).  Screen you for osteoporosis.  Predict your risk for a broken bone (fracture).  Determine how well your osteoporosis treatment is working. Tell a health care provider about:  Any allergies you have.  All medicines you are taking, including vitamins, herbs, eye drops, creams, and over-the-counter medicines.  Any problems you or family members have had with anesthetic medicines.  Any blood disorders you have.  Any surgeries you have had.  Any medical conditions you have.  Whether you are pregnant or may be pregnant.  Any medical tests you have had within  the past 14 days that used contrast material. What are the risks? Generally, this is a safe procedure. However, it does expose you to a small amount of radiation, which can slightly increase your cancer risk. What happens before the procedure?  Do not take any calcium supplements starting 24 hours before your test.  Remove all metal jewelry, eyeglasses, dental appliances, and any other metal objects. What happens during the procedure?   You will lie down on an exam table. There will be an X-ray generator below you and an imaging device above you.  Other devices, such as boxes or braces, may be used to position your body properly for the scan.  The machine will slowly scan your body. You will need to keep still.  The images will show up on a screen in the room. Images will be examined by a specialist after your test is done. The procedure may vary among health care providers and hospitals. What happens after the procedure?  It is up to you to get your test results. Ask your health care provider, or the department that is doing the test, when your results will be ready. Summary  A bone density test is an imaging test that uses a type of X-ray to measure the amount of calcium and other minerals in your bones.  The test may be used to diagnose or screen you for a  condition that causes weak or thin bones (osteoporosis), predict your risk for a broken bone (fracture), or determine how well your osteoporosis treatment is working.  Do not take any calcium supplements starting 24 hours before your test.  Ask your health care provider, or the department that is doing the test, when your results will be ready. This information is not intended to replace advice given to you by your health care provider. Make sure you discuss any questions you have with your health care provider. Document Released: 04/27/2004 Document Revised: 02/07/2017 Document Reviewed: 02/07/2017 Elsevier Interactive Patient  Education  2019 [place] Prevention in the Home, Adult Falls can cause injuries. They can happen to people of all ages. There are many things you can do to make your home safe and to help prevent falls. Ask for help when making these changes, if needed. What actions can I take to prevent falls? General Instructions  Use good lighting in all rooms. Replace any light bulbs that burn out.  Turn on the lights when you go into a dark area. Use night-lights.  Keep items that you use often in easy-to-reach places. Lower the shelves around your home if necessary.  Set up your furniture so you have a clear path. Avoid moving your furniture around.  Do not have throw rugs and other things on the floor that can make you trip.  Avoid walking on wet floors.  If any of your floors are uneven, fix them.  Add color or contrast paint or tape to clearly mark and help you see: ? Any grab bars or handrails. ? First and last steps of stairways. ? Where the edge of each step is.  If you use a stepladder: ? Make sure that it is fully opened. Do not climb a closed stepladder. ? Make sure that both sides of the stepladder are locked into place. ? Ask someone to hold the stepladder for you while you use it.  If there are any pets around you, be aware of where they are. What can I do in the bathroom?      Keep the floor dry. Clean up any water that spills onto the floor as soon as it happens.  Remove soap buildup in the tub or shower regularly.  Use non-skid mats or decals on the floor of the tub or shower.  Attach bath mats securely with double-sided, non-slip rug tape.  If you need to sit down in the shower, use a plastic, non-slip stool.  Install grab bars by the toilet and in the tub and shower. Do not use towel bars as grab bars. What can I do in the bedroom?  Make sure that you have a light by your bed that is easy to reach.  Do not use any sheets or blankets that are too  big for your bed. They should not hang down onto the floor.  Have a firm chair that has side arms. You can use this for support while you get dressed. What can I do in the kitchen?  Clean up any spills right away.  If you need to reach something above you, use a strong step stool that has a grab bar.  Keep electrical cords out of the way.  Do not use floor polish or wax that makes floors slippery. If you must use wax, use non-skid floor wax. What can I do with my stairs?  Do not leave any items on the stairs.  Make sure that  you have a light switch at the top of the stairs and the bottom of the stairs. If you do not have them, ask someone to add them for you.  Make sure that there are handrails on both sides of the stairs, and use them. Fix handrails that are broken or loose. Make sure that handrails are as long as the stairways.  Install non-slip stair treads on all stairs in your home.  Avoid having throw rugs at the top or bottom of the stairs. If you do have throw rugs, attach them to the floor with carpet tape.  Choose a carpet that does not hide the edge of the steps on the stairway.  Check any carpeting to make sure that it is firmly attached to the stairs. Fix any carpet that is loose or worn. What can I do on the outside of my home?  Use bright outdoor lighting.  Regularly fix the edges of walkways and driveways and fix any cracks.  Remove anything that might make you trip as you walk through a door, such as a raised step or threshold.  Trim any bushes or trees on the path to your home.  Regularly check to see if handrails are loose or broken. Make sure that both sides of any steps have handrails.  Install guardrails along the edges of any raised decks and porches.  Clear walking paths of anything that might make someone trip, such as tools or rocks.  Have any leaves, snow, or ice cleared regularly.  Use sand or salt on walking paths during winter.  Clean up  any spills in your garage right away. This includes grease or oil spills. What other actions can I take?  Wear shoes that: ? Have a low heel. Do not wear high heels. ? Have rubber bottoms. ? Are comfortable and fit you well. ? Are closed at the toe. Do not wear open-toe sandals.  Use tools that help you move around (mobility aids) if they are needed. These include: ? Canes. ? Walkers. ? Scooters. ? Crutches.  Review your medicines with your doctor. Some medicines can make you feel dizzy. This can increase your chance of falling. Ask your doctor what other things you can do to help prevent falls. Where to find more information  Centers for Disease Control and Prevention, [employer]AD[employer]: https://garcia.biz/  [employer] on Aging: BrainJudge.co.uk Contact a doctor if:  You are afraid of falling at home.  You feel weak, drowsy, or dizzy at home.  You fall at home. Summary  There are many simple things that you can do to make your home safe and to help prevent falls.  Ways to make your home safe include removing tripping hazards and installing grab bars in the bathroom.  Ask for help when making these changes in your home. This information is not intended to replace advice given to you by your health care provider. Make sure you discuss any questions you have with your health care provider. Document Released: 01/30/2009 Document Revised: 11/18/2016 Document Reviewed: 11/18/2016 Elsevier Interactive Patient Education  2019 [employer].

## 2018-05-03 NOTE — Progress Notes (Signed)
Subjective:   Rachel Hobbs is a 78 y.o. female who presents for Medicare Annual (Subsequent) preventive examination.  Review of Systems:  Physical assessment deferred to PCP.  Cardiac Risk Factors include: advanced age (>58mn, >>55women);diabetes mellitus;dyslipidemia;obesity (BMI >30kg/m2)     Objective:     Vitals: BP 120/70   Pulse 80   Temp 98.9 F (37.2 C) (Oral)   Ht 5' 2"  (1.575 m)   Wt 229 lb (103.9 kg)   SpO2 99%   BMI 41.88 kg/m   Body mass index is 41.88 kg/m.  Advanced Directives 05/03/2018 02/24/2018 09/23/2017 08/19/2017 07/04/2017 05/27/2017 05/17/2017  Does Patient Have a Medical Advance Directive? No No No No No No No  Would patient like information on creating a medical advance directive? No - Patient declined No - Patient declined No - Patient declined No - Patient declined No - Patient declined No - Patient declined No - Patient declined  Pre-existing out of facility DNR order (yellow form or pink MOST form) - - - - - - -    Tobacco Social History   Tobacco Use  Smoking Status Never Smoker  Smokeless Tobacco Never Used     Counseling given: Not Answered   Clinical Intake:  Pre-visit preparation completed: Yes  Pain : No/denies pain Pain Score: 0-No pain     Nutritional Status: BMI > 30  Obese Diabetes: Yes CBG done?: No Did pt. bring in CBG monitor from home?: No  What is the last grade level you completed in school?: 12th grade  Interpreter Needed?: No     Past Medical History:  Diagnosis Date  . Arthritis   . CHF (congestive heart failure) ([facility]   . Depression   . Diabetes mellitus    type 2  . Hernia   . Hyperlipidemia   . Hypertension   . Iron deficiency anemia   . Low iron   . Peripheral vascular disease (HCC)    in legs  . Pneumonia    teenager  . Renal disorder    CKD - dialysis T/TH/Sa  . Shortness of breath dyspnea    with exertion   Past Surgical History:  Procedure Laterality Date  . ABDOMINAL HYSTERECTOMY      in the 70's  . AV FISTULA PLACEMENT Left 12/05/2015   Procedure: RADIOCEPHALIC VERSUS BRACHIOCEPHALIC ARTERIOVENOUS (AV) FISTULA CREATION;  Surgeon: CElam Dutch MD;  [facility]  Service: Vascular;  Laterality: Left;  . AV FISTULA PLACEMENT Left 05/20/2017   Procedure: INSERTION OF ARTERIOVENOUS (AV) GORE-TEX VASCULAR STRETCH 4-7 GRAFT ARM LEFT UPPER ARM;  Surgeon: ERosetta Posner MD;  [place] [facility]  Service: Vascular;  Laterality: Left;  . BASCILIC VEIN TRANSPOSITION Left 06/04/2016   Procedure: FIRST STAGE BASILIC VEIN TRANSPOSITION;  Surgeon: VSerafina Mitchell MD;  [place]  Service: Vascular;  Laterality: Left;  . BASCILIC VEIN TRANSPOSITION Left 08/04/2016   Procedure: LEFT UPPER ARM [place] SECOND STAGE;  Surgeon: VSerafina Mitchell MD;  [place] [facility]  Service: Vascular;  Laterality: Left;  . HERNIA REPAIR     umbilical in the 716'X . INSERTION OF DIALYSIS CATHETER Right 12/05/2015   Procedure: INSERTION OF DIALYSIS CATHETER;  Surgeon: CElam Dutch MD;  [facility]  Service: Vascular;  Laterality: Right;   Family History  Problem Relation Age of Onset  . Kidney disease Mother   . Hypertension Mother   . COPD Father  smoke  . Cancer Father        Lung  . Hypertension Father   . Kidney disease Sister   . Diabetes Daughter    Social History   Socioeconomic History  . Marital status: Single    Spouse name: Not on file  . Number of children: 1  . Years of education: 43  . Highest education level: 12th grade  Occupational History  . Occupation: Retired- [employer]     Employer: RETIRED  Social Needs  . Financial resource strain: Not very hard  . Food insecurity:    Worry: Sometimes true    Inability: Sometimes true  . Transportation needs:    Medical: No    Non-medical: No  Tobacco Use  . Smoking status: Never Smoker  . Smokeless tobacco: Never Used  Substance and Sexual Activity  . Alcohol use: No  . Drug use: No  .  Sexual activity: Not Currently  Lifestyle  . Physical activity:    Days per week: 0 days    Minutes per session: 0 min  . Stress: Only a little  Relationships  . Social connections:    Talks on phone: More than three times a week    Gets together: Three times a week    Attends religious service: More than 4 times per year    Active member of club or organization: No    Attends meetings of clubs or organizations: Never    Relationship status: Widowed  Other Topics Concern  . Not on file  Social History Narrative   Current Social History       Patient lives with daughter [name]) in one level home with ramp; has smoke alarms, no tripping hazards, no grab bars in bathroom.   Picky eater; likes to eat "bad stuff", some green vegetables, likes carbs.   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                                                                                                           Outpatient Encounter Medications as of 05/03/2018  Medication Sig  . acetaminophen (TYLENOL) 325 MG tablet Take 650 mg by mouth daily as needed for moderate pain or headache.  Marland Kitchen aspirin 81 MG tablet Take 81 mg by mouth daily.    Marland Kitchen atorvastatin (LIPITOR) 40 MG tablet TAKE 1 TABLET BY MOUTH AT NIGHT FOR CHOLESTEROL  . B Complex-C-Folic Acid (NEPHRO VITAMINS) 0.8 MG TABS Take 1 tablet by mouth daily.  . bisacodyl (BISACODYL) 5 MG EC tablet Take 5 mg by mouth daily as needed for moderate constipation.  . Blood Glucose Monitoring Suppl (ONETOUCH VERIO) w/Device  KIT Use daily as indicated  . calcium acetate (PHOSLO) 667 MG capsule Take 667-1,334 mg by mouth See admin  instructions. Take 1334 mg with each meal and 667 mg with each snack  . cetirizine (ZYRTEC) 5 MG tablet Take 1 tablet (5 mg total) by mouth daily.  Marland Kitchen ethyl chloride spray APP TO ACCESS PRE-DIALYSIS UTD  . gabapentin (NEURONTIN) 100 MG capsule Take 1 capsule (100 mg total) by mouth 2 (two) times daily.  Marland Kitchen glucose blood (ONETOUCH VERIO) test strip Use as instructed  . Insulin Glargine (LANTUS SOLOSTAR) 100 UNIT/ML Solostar Pen INJECT 30-35 UNITS UNDER THE SKIN DAILY AT SUPPER  . Insulin Pen Needle 31G X 8 MM MISC BD UltraFine III Pen Needles. For use with insulin pen device. Inject insulin 6 x daily  . isosorbide mononitrate (IMDUR) 30 MG 24 hr tablet Take 30 mg by mouth every evening.   . Lancets (ONETOUCH ULTRASOFT) lancets Once daily testing plus prn for hypoglycemia  . NEEDLE, DISP, 30 G (B-D DISP NEEDLE 30GX1") 30G X 1" MISC 1 each by Does not apply route daily.  . ONE TOUCH ULTRA TEST test strip TEST BLOOD SUGAR EVERY DAY AND AS NEEDED FOR SYMPTOMS OF HYPOGLYCEMIA  . ONETOUCH DELICA LANCETS 76O MISC Use daily as indicated  . Skin Protectants, Misc. (EUCERIN) cream Apply topically as needed for dry skin.  Marland Kitchen triamcinolone ointment (KENALOG) 0.5 % Apply 1 application topically 2 (two) times daily.  . Blood Glucose Monitoring Suppl (BLOOD GLUCOSE METER) kit Use as instructed  . cephALEXin (KEFLEX) 500 MG capsule Take 1 capsule (500 mg total) by mouth 3 (three) times daily. (Patient not taking: Reported on 05/03/2018)  . clobetasol ointment (TEMOVATE) 1.15 % Apply 1 application topically 2 (two) times daily. For very severe eczema.  Do not use for more than 1 week at a time. (Patient not taking: Reported on 05/03/2018)  . gabapentin (NEURONTIN) 100 MG capsule Take 1 capsule (100 mg total) by mouth 2 (two) times daily for 15 days.  . hydrOXYzine (ATARAX/VISTARIL) 10 MG tablet Take 1 tablet (10 mg total) by mouth 3 (three) times daily as needed. (Patient not taking: Reported on 05/03/2018)  . Lancet  Devices (ONE TOUCH DELICA LANCING DEV) MISC Use daily as instructed (Patient not taking: Reported on 05/03/2018)  . oxyCODONE-acetaminophen (PERCOCET) 5-325 MG tablet Take 1 tablet by mouth every 6 (six) hours as needed for severe pain. (Patient not taking: Reported on 05/03/2018)   No facility-administered encounter medications on file as of 05/03/2018.     Activities of Daily Living In your present state of health, do you have any difficulty performing the following activities: 05/03/2018 05/20/2017  Hearing? N N  Vision? N N  Comment reading glasses; last eye exam this past summer -  Difficulty concentrating or making decisions? N N  Walking or climbing stairs? Y N  Dressing or bathing? N N  Doing errands, shopping? N -  Preparing Food and eating ? N -  Using the Toilet? N -  In the past six months, have you accidently leaked urine? N -  Do you have problems with loss of bowel control? N -  Managing your Medications? N -  Managing your Finances? N -  Housekeeping or managing your Housekeeping? N -  Some recent data might be hidden    Patient Care Team: Marjie Skiff, MD as PCP - General (Family Medicine) Corliss Parish, MD (Nephrology) Ralene Bathe, MD (Ophthalmology) Bensimhon, Shaune Pascal, MD (Cardiology) [facility], [place],  Belenda Cruise, DPM as Consulting Physician (Podiatry)    Assessment:   This is a routine wellness examination for Rachel Hobbs.  Exercise Activities and Dietary recommendations Current Exercise Habits: The patient does not participate in regular exercise at present  Goals    . Consume at least 2-4 vegetables daily.     . Exercise 2x per week (30 min per time)        Fall Risk Fall Risk  05/03/2018 02/24/2018 09/23/2017 08/19/2017 07/04/2017  Falls in the past year? 0 0 No No No  Risk for fall due to : Impaired mobility - - - -   Is the patient's home free of loose throw rugs in walkways, pet beds, electrical cords, etc?   yes      Grab bars  in the bathroom? no      Handrails on the stairs?   yes      Adequate lighting?   yes   Depression Screen PHQ 2/9 Scores 05/03/2018 02/24/2018 09/23/2017 08/19/2017  PHQ - 2 Score 0 0 0 0  PHQ- 9 Score - - - -     Cognitive Function MMSE - Mini Mental State Exam 05/03/2018 06/27/2013 10/15/2010  Orientation to time 5 5 5   Orientation to Place 5 5 5   Registration 3 3 3   Attention/ Calculation 5 4 5   Recall 3 2 2   Language- name 2 objects 2 2 2   Language- repeat 1 1 1   Language- follow 3 step command 3 3 3   Language- read & follow direction 1 1 1   Write a sentence 1 1 1   Copy design 1 1 1   Total score 30 28 29      6CIT Screen 05/03/2018  What Year? 0 points  What month? 0 points  What time? 0 points  Count back from 20 0 points  Months in reverse 0 points  Repeat phrase 0 points  Total Score 0    Immunization History  Administered Date(s) Administered  . Influenza Split 04/25/2012  . Influenza,inj,Quad PF,6+ Mos 05/22/2014, 02/13/2015  . Influenza-Unspecified 04/19/2013, 12/16/2016, 12/18/2017  . Td 01/22/2005     Screening Tests Health Maintenance  Topic Date Due  . DEXA SCAN  05/04/2019 (Originally 04/22/2006)  . TETANUS/TDAP  05/04/2019 (Originally 01/23/2015)  . PNA vac Low Risk Adult (1 of 2 - PCV13) 05/04/2019 (Originally 04/22/2006)  . HEMOGLOBIN A1C  08/25/2018  . OPHTHALMOLOGY EXAM  09/18/2018  . FOOT EXAM  02/07/2019  . INFLUENZA VACCINE  Completed    Cancer Screenings: Lung: Low Dose CT Chest recommended if Age 73-80 years, 30 pack-year currently smoking OR have quit w/in 15years. Patient does not qualify. Breast:  Up to date on Mammogram? Yes   Up to date of Bone Density/Dexa? No Colorectal: N/A     Plan:  Health Maintenance up to date. Declined DEXA, Tdap, and Pneumovax. Foot exam performed at [place]. Will attempt to get last eye exam from 2019 faxed to [place]. Patient has appt with Dr Valentina Lucks on 05/05/18.   I have personally reviewed and noted the  following in the patient's chart:   . Medical and social history . Use of alcohol, tobacco or illicit drugs  . Current medications and supplements . Functional ability and status . Nutritional status . Physical activity . Advanced directives . List of other physicians . Hospitalizations, surgeries, and ER visits in previous 12 months . Vitals . Screenings to include cognitive, depression, and falls . Referrals and appointments  In addition, I have reviewed and  discussed with patient certain preventive protocols, quality metrics, and best practice recommendations. A written personalized care plan for preventive services as well as general preventive health recommendations were provided to patient.     Esau Grew, RN  05/03/2018

## 2018-05-04 ENCOUNTER — Other Ambulatory Visit: Payer: Self-pay | Admitting: Family Medicine

## 2018-05-04 DIAGNOSIS — D509 Iron deficiency anemia, unspecified: Secondary | ICD-10-CM | POA: Diagnosis not present

## 2018-05-04 DIAGNOSIS — G629 Polyneuropathy, unspecified: Secondary | ICD-10-CM

## 2018-05-04 DIAGNOSIS — D631 Anemia in chronic kidney disease: Secondary | ICD-10-CM | POA: Diagnosis not present

## 2018-05-04 DIAGNOSIS — E118 Type 2 diabetes mellitus with unspecified complications: Secondary | ICD-10-CM

## 2018-05-04 DIAGNOSIS — N2581 Secondary hyperparathyroidism of renal origin: Secondary | ICD-10-CM | POA: Diagnosis not present

## 2018-05-04 DIAGNOSIS — G608 Other hereditary and idiopathic neuropathies: Secondary | ICD-10-CM

## 2018-05-04 DIAGNOSIS — N186 End stage renal disease: Secondary | ICD-10-CM | POA: Diagnosis not present

## 2018-05-04 DIAGNOSIS — D688 Other specified coagulation defects: Secondary | ICD-10-CM | POA: Diagnosis not present

## 2018-05-04 DIAGNOSIS — E1129 Type 2 diabetes mellitus with other diabetic kidney complication: Secondary | ICD-10-CM | POA: Diagnosis not present

## 2018-05-04 DIAGNOSIS — R51 Headache: Secondary | ICD-10-CM | POA: Diagnosis not present

## 2018-05-04 NOTE — Progress Notes (Signed)
I have reviewed this visit and agree with the documentation.   

## 2018-05-05 ENCOUNTER — Ambulatory Visit (INDEPENDENT_AMBULATORY_CARE_PROVIDER_SITE_OTHER): Payer: Medicare Other | Admitting: Pharmacist

## 2018-05-05 ENCOUNTER — Encounter: Payer: Self-pay | Admitting: Pharmacist

## 2018-05-05 DIAGNOSIS — E118 Type 2 diabetes mellitus with unspecified complications: Secondary | ICD-10-CM | POA: Diagnosis not present

## 2018-05-05 MED ORDER — INSULIN PEN NEEDLE 31G X 8 MM MISC: 3 refills | Status: DC

## 2018-05-05 MED ORDER — INSULIN GLARGINE 100 UNIT/ML SOLOSTAR PEN: 24.0000 [IU] | PEN_INJECTOR | Freq: Every day | SUBCUTANEOUS | 3 refills | Status: DC

## 2018-05-05 MED ORDER — INSULIN LISPRO (1 UNIT DIAL) 100 UNIT/ML (KWIKPEN): 8.0000 [IU] | PEN_INJECTOR | Freq: Every day | SUBCUTANEOUS | 11 refills | Status: DC

## 2018-05-05 NOTE — Progress Notes (Signed)
S:     Chief Complaint  Patient presents with  . Medication Management    diabetes    Patient arrives in good spirits, ambulating slowly with assistance of a cane.  Presents for diabetes evaluation, education, and management at the request of PCP, Dr. Andy Gauss, referred and last seen on 02/24/2018.   Patient reports Diabetes was diagnosed in late 1990.  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 Sometimes patient misses insulin dose after dialysis. Current diabetes medications include: Lantus (insulin glargine) 30 to 35 units at dinner  Patient reports hypoglycemic events. On one occasion, patient notes reading of 1 with.   Patient reported dietary habits: Eats 2 meals/day. Dinner is her largest meal. Patient continues to eat sweets regularly, "I'm a sweet tooth."   Patient reports neuropathy in right lower leg.    O:  Physical Exam Vitals signs reviewed.  Neurological:     Mental Status: She is alert.      Review of Systems  Neurological: Positive for tingling.       In feet  All other systems reviewed and are negative.    Lab Results  Component Value Date   HGBA1C 8.6 (A) 02/24/2018   Vitals:   05/05/18 1130  BP: 126/74  Pulse: 88  SpO2: 94%    Lipid Panel     Component Value Date/Time   CHOL 132 08/23/2016 1158   TRIG 134 08/23/2016 1158   TRIG 188 04/22/2008   HDL 54 08/23/2016 1158   CHOLHDL 2.4 08/23/2016 1158   CHOLHDL 2.8 06/27/2013 1508   VLDL 20 06/27/2013 1508   LDLCALC 51 08/23/2016 1158    Home fasting CBG: low 100s  2 hour post-prandial/random CBG: high 100s to above 200   Clinical ASCVD: NO The 10-year ASCVD risk score Mikey Bussing DC Jr., et al., 2013) is: 25.1%   Values used to calculate the score:     Age: 49 years     Sex: Female     Is Non-Hispanic African American: Yes     Diabetic: Yes     Tobacco smoker: No     Systolic Blood Pressure: 683 mmHg     Is BP  treated: Yes     HDL Cholesterol: 54 mg/dL     Total Cholesterol: 132 mg/dL    A/P: Diabetes longstanding diagnosed late 1990 currently uncontrolled. Patient is able to verbalize appropriate hypoglycemia management plan and drinks orange juice. Patient is adherent with medication. Control is suboptimal due to dietary habits and natural progression of diabetes. -Adjusted dose of basal insulin Lantus (insulin glargine). Patient will use 24 units instead of 30 to 35 units daily at dinner. Patient preference to keep the long acting insulin at the same time as her meal time insulin daily for convenience.   -Started  rapid insulin Humalog (insulin lispro) 8 units once daily with dinner.  -Extensively discussed pathophysiology of DM, recommended lifestyle interventions, dietary effects on glycemic control -Counseled on s/sx of and management of hypoglycemia -Requested at least one CBG check daily and asked for various times including first AM, pre-evening meal and QHS.  -Next A1C anticipated 3-6 months.  ASCVD risk - primary prevention in patient with DM. Last LDL is controlled. ASCVD risk score is >20%  - high intensity statin continued. Aspirin continued. Consider new cholesterol panel at next blood draw (> 1 year since last).    Written patient instructions provided.  Total time in face to face counseling  30 minutes.   Follow up Pharmacist Clinic Visit in 4 to 6 weeks.   Patient seen with Emeline General, PharmD Candidate.

## 2018-05-05 NOTE — Patient Instructions (Addendum)
Nice to see you today!  Reduce your Lantus to 24 units each day at the same time (at dinner).   START Humalog 8 units each day prior to dinner.   Follow up with Dr. Andy Gauss in the next few weeks.  Please schedule follow-up in pharmacy clinic in 4-6 weeks.

## 2018-05-05 NOTE — Assessment & Plan Note (Signed)
Diabetes longstanding diagnosed late 1990 currently uncontrolled. Patient is able to verbalize appropriate hypoglycemia management plan and drinks orange juice. Patient is adherent with medication. Control is suboptimal due to dietary habits and natural progression of diabetes. -Adjusted dose of basal insulin Lantus (insulin glargine). Patient will use 24 units instead of 30 to 35 units daily at dinner. Patient preference to keep the long acting insulin at the same time as her meal time insulin daily for convenience.   -Started  rapid insulin Humalog (insulin lispro) 8 units once daily with dinner.  -Extensively discussed pathophysiology of DM, recommended lifestyle interventions, dietary effects on glycemic control -Counseled on s/sx of and management of hypoglycemia -Requested at least one CBG check daily and asked for various times including first AM, pre-evening meal and QHS.  -Next A1C anticipated 3-6 months.

## 2018-05-06 DIAGNOSIS — D688 Other specified coagulation defects: Secondary | ICD-10-CM | POA: Diagnosis not present

## 2018-05-06 DIAGNOSIS — D631 Anemia in chronic kidney disease: Secondary | ICD-10-CM | POA: Diagnosis not present

## 2018-05-06 DIAGNOSIS — N186 End stage renal disease: Secondary | ICD-10-CM | POA: Diagnosis not present

## 2018-05-06 DIAGNOSIS — D509 Iron deficiency anemia, unspecified: Secondary | ICD-10-CM | POA: Diagnosis not present

## 2018-05-06 DIAGNOSIS — R51 Headache: Secondary | ICD-10-CM | POA: Diagnosis not present

## 2018-05-06 DIAGNOSIS — N2581 Secondary hyperparathyroidism of renal origin: Secondary | ICD-10-CM | POA: Diagnosis not present

## 2018-05-06 DIAGNOSIS — E1129 Type 2 diabetes mellitus with other diabetic kidney complication: Secondary | ICD-10-CM | POA: Diagnosis not present

## 2018-05-06 NOTE — Progress Notes (Signed)
Patient ID: Rachel Hobbs, female   DOB: [birth year], 78 y.o.   MRN: [id number] Reviewed: I agree with Dr. Graylin Shiver documentation and management.

## 2018-05-09 ENCOUNTER — Ambulatory Visit: Payer: Medicare Other | Admitting: Podiatry

## 2018-05-09 DIAGNOSIS — E1129 Type 2 diabetes mellitus with other diabetic kidney complication: Secondary | ICD-10-CM | POA: Diagnosis not present

## 2018-05-09 DIAGNOSIS — N186 End stage renal disease: Secondary | ICD-10-CM | POA: Diagnosis not present

## 2018-05-09 DIAGNOSIS — D688 Other specified coagulation defects: Secondary | ICD-10-CM | POA: Diagnosis not present

## 2018-05-09 DIAGNOSIS — D509 Iron deficiency anemia, unspecified: Secondary | ICD-10-CM | POA: Diagnosis not present

## 2018-05-09 DIAGNOSIS — R51 Headache: Secondary | ICD-10-CM | POA: Diagnosis not present

## 2018-05-09 DIAGNOSIS — N2581 Secondary hyperparathyroidism of renal origin: Secondary | ICD-10-CM | POA: Diagnosis not present

## 2018-05-09 DIAGNOSIS — D631 Anemia in chronic kidney disease: Secondary | ICD-10-CM | POA: Diagnosis not present

## 2018-05-11 DIAGNOSIS — D688 Other specified coagulation defects: Secondary | ICD-10-CM | POA: Diagnosis not present

## 2018-05-11 DIAGNOSIS — N186 End stage renal disease: Secondary | ICD-10-CM | POA: Diagnosis not present

## 2018-05-11 DIAGNOSIS — D631 Anemia in chronic kidney disease: Secondary | ICD-10-CM | POA: Diagnosis not present

## 2018-05-11 DIAGNOSIS — R51 Headache: Secondary | ICD-10-CM | POA: Diagnosis not present

## 2018-05-11 DIAGNOSIS — E1129 Type 2 diabetes mellitus with other diabetic kidney complication: Secondary | ICD-10-CM | POA: Diagnosis not present

## 2018-05-11 DIAGNOSIS — D509 Iron deficiency anemia, unspecified: Secondary | ICD-10-CM | POA: Diagnosis not present

## 2018-05-11 DIAGNOSIS — N2581 Secondary hyperparathyroidism of renal origin: Secondary | ICD-10-CM | POA: Diagnosis not present

## 2018-05-13 DIAGNOSIS — D688 Other specified coagulation defects: Secondary | ICD-10-CM | POA: Diagnosis not present

## 2018-05-13 DIAGNOSIS — D509 Iron deficiency anemia, unspecified: Secondary | ICD-10-CM | POA: Diagnosis not present

## 2018-05-13 DIAGNOSIS — R51 Headache: Secondary | ICD-10-CM | POA: Diagnosis not present

## 2018-05-13 DIAGNOSIS — N186 End stage renal disease: Secondary | ICD-10-CM | POA: Diagnosis not present

## 2018-05-13 DIAGNOSIS — D631 Anemia in chronic kidney disease: Secondary | ICD-10-CM | POA: Diagnosis not present

## 2018-05-13 DIAGNOSIS — E1129 Type 2 diabetes mellitus with other diabetic kidney complication: Secondary | ICD-10-CM | POA: Diagnosis not present

## 2018-05-13 DIAGNOSIS — N2581 Secondary hyperparathyroidism of renal origin: Secondary | ICD-10-CM | POA: Diagnosis not present

## 2018-05-16 ENCOUNTER — Other Ambulatory Visit: Payer: Self-pay | Admitting: Family Medicine

## 2018-05-16 DIAGNOSIS — R51 Headache: Secondary | ICD-10-CM | POA: Diagnosis not present

## 2018-05-16 DIAGNOSIS — N2581 Secondary hyperparathyroidism of renal origin: Secondary | ICD-10-CM | POA: Diagnosis not present

## 2018-05-16 DIAGNOSIS — E1129 Type 2 diabetes mellitus with other diabetic kidney complication: Secondary | ICD-10-CM | POA: Diagnosis not present

## 2018-05-16 DIAGNOSIS — D509 Iron deficiency anemia, unspecified: Secondary | ICD-10-CM | POA: Diagnosis not present

## 2018-05-16 DIAGNOSIS — D631 Anemia in chronic kidney disease: Secondary | ICD-10-CM | POA: Diagnosis not present

## 2018-05-16 DIAGNOSIS — D688 Other specified coagulation defects: Secondary | ICD-10-CM | POA: Diagnosis not present

## 2018-05-16 DIAGNOSIS — N186 End stage renal disease: Secondary | ICD-10-CM | POA: Diagnosis not present

## 2018-05-18 DIAGNOSIS — D631 Anemia in chronic kidney disease: Secondary | ICD-10-CM | POA: Diagnosis not present

## 2018-05-18 DIAGNOSIS — N186 End stage renal disease: Secondary | ICD-10-CM | POA: Diagnosis not present

## 2018-05-18 DIAGNOSIS — D509 Iron deficiency anemia, unspecified: Secondary | ICD-10-CM | POA: Diagnosis not present

## 2018-05-18 DIAGNOSIS — N2581 Secondary hyperparathyroidism of renal origin: Secondary | ICD-10-CM | POA: Diagnosis not present

## 2018-05-18 DIAGNOSIS — D688 Other specified coagulation defects: Secondary | ICD-10-CM | POA: Diagnosis not present

## 2018-05-18 DIAGNOSIS — R51 Headache: Secondary | ICD-10-CM | POA: Diagnosis not present

## 2018-05-18 DIAGNOSIS — E1129 Type 2 diabetes mellitus with other diabetic kidney complication: Secondary | ICD-10-CM | POA: Diagnosis not present

## 2018-05-19 DIAGNOSIS — E1129 Type 2 diabetes mellitus with other diabetic kidney complication: Secondary | ICD-10-CM | POA: Diagnosis not present

## 2018-05-19 DIAGNOSIS — Z992 Dependence on renal dialysis: Secondary | ICD-10-CM | POA: Diagnosis not present

## 2018-05-19 DIAGNOSIS — N186 End stage renal disease: Secondary | ICD-10-CM | POA: Diagnosis not present

## 2018-05-20 DIAGNOSIS — N2581 Secondary hyperparathyroidism of renal origin: Secondary | ICD-10-CM | POA: Diagnosis not present

## 2018-05-20 DIAGNOSIS — D509 Iron deficiency anemia, unspecified: Secondary | ICD-10-CM | POA: Diagnosis not present

## 2018-05-20 DIAGNOSIS — D631 Anemia in chronic kidney disease: Secondary | ICD-10-CM | POA: Diagnosis not present

## 2018-05-20 DIAGNOSIS — R51 Headache: Secondary | ICD-10-CM | POA: Diagnosis not present

## 2018-05-20 DIAGNOSIS — N186 End stage renal disease: Secondary | ICD-10-CM | POA: Diagnosis not present

## 2018-05-20 DIAGNOSIS — E1129 Type 2 diabetes mellitus with other diabetic kidney complication: Secondary | ICD-10-CM | POA: Diagnosis not present

## 2018-05-20 DIAGNOSIS — R739 Hyperglycemia, unspecified: Secondary | ICD-10-CM | POA: Diagnosis not present

## 2018-05-20 DIAGNOSIS — D688 Other specified coagulation defects: Secondary | ICD-10-CM | POA: Diagnosis not present

## 2018-05-23 DIAGNOSIS — R739 Hyperglycemia, unspecified: Secondary | ICD-10-CM | POA: Diagnosis not present

## 2018-05-23 DIAGNOSIS — R51 Headache: Secondary | ICD-10-CM | POA: Diagnosis not present

## 2018-05-23 DIAGNOSIS — N2581 Secondary hyperparathyroidism of renal origin: Secondary | ICD-10-CM | POA: Diagnosis not present

## 2018-05-23 DIAGNOSIS — D631 Anemia in chronic kidney disease: Secondary | ICD-10-CM | POA: Diagnosis not present

## 2018-05-23 DIAGNOSIS — D509 Iron deficiency anemia, unspecified: Secondary | ICD-10-CM | POA: Diagnosis not present

## 2018-05-23 DIAGNOSIS — D688 Other specified coagulation defects: Secondary | ICD-10-CM | POA: Diagnosis not present

## 2018-05-23 DIAGNOSIS — N186 End stage renal disease: Secondary | ICD-10-CM | POA: Diagnosis not present

## 2018-05-23 DIAGNOSIS — E1129 Type 2 diabetes mellitus with other diabetic kidney complication: Secondary | ICD-10-CM | POA: Diagnosis not present

## 2018-05-25 DIAGNOSIS — R51 Headache: Secondary | ICD-10-CM | POA: Diagnosis not present

## 2018-05-25 DIAGNOSIS — D631 Anemia in chronic kidney disease: Secondary | ICD-10-CM | POA: Diagnosis not present

## 2018-05-25 DIAGNOSIS — R739 Hyperglycemia, unspecified: Secondary | ICD-10-CM | POA: Diagnosis not present

## 2018-05-25 DIAGNOSIS — N2581 Secondary hyperparathyroidism of renal origin: Secondary | ICD-10-CM | POA: Diagnosis not present

## 2018-05-25 DIAGNOSIS — D688 Other specified coagulation defects: Secondary | ICD-10-CM | POA: Diagnosis not present

## 2018-05-25 DIAGNOSIS — D509 Iron deficiency anemia, unspecified: Secondary | ICD-10-CM | POA: Diagnosis not present

## 2018-05-25 DIAGNOSIS — N186 End stage renal disease: Secondary | ICD-10-CM | POA: Diagnosis not present

## 2018-05-25 DIAGNOSIS — E1129 Type 2 diabetes mellitus with other diabetic kidney complication: Secondary | ICD-10-CM | POA: Diagnosis not present

## 2018-05-27 DIAGNOSIS — N2581 Secondary hyperparathyroidism of renal origin: Secondary | ICD-10-CM | POA: Diagnosis not present

## 2018-05-27 DIAGNOSIS — D688 Other specified coagulation defects: Secondary | ICD-10-CM | POA: Diagnosis not present

## 2018-05-27 DIAGNOSIS — E1129 Type 2 diabetes mellitus with other diabetic kidney complication: Secondary | ICD-10-CM | POA: Diagnosis not present

## 2018-05-27 DIAGNOSIS — N186 End stage renal disease: Secondary | ICD-10-CM | POA: Diagnosis not present

## 2018-05-27 DIAGNOSIS — D631 Anemia in chronic kidney disease: Secondary | ICD-10-CM | POA: Diagnosis not present

## 2018-05-27 DIAGNOSIS — D509 Iron deficiency anemia, unspecified: Secondary | ICD-10-CM | POA: Diagnosis not present

## 2018-05-27 DIAGNOSIS — R739 Hyperglycemia, unspecified: Secondary | ICD-10-CM | POA: Diagnosis not present

## 2018-05-27 DIAGNOSIS — R51 Headache: Secondary | ICD-10-CM | POA: Diagnosis not present

## 2018-05-30 DIAGNOSIS — R51 Headache: Secondary | ICD-10-CM | POA: Diagnosis not present

## 2018-05-30 DIAGNOSIS — N186 End stage renal disease: Secondary | ICD-10-CM | POA: Diagnosis not present

## 2018-05-30 DIAGNOSIS — E1129 Type 2 diabetes mellitus with other diabetic kidney complication: Secondary | ICD-10-CM | POA: Diagnosis not present

## 2018-05-30 DIAGNOSIS — D631 Anemia in chronic kidney disease: Secondary | ICD-10-CM | POA: Diagnosis not present

## 2018-05-30 DIAGNOSIS — R739 Hyperglycemia, unspecified: Secondary | ICD-10-CM | POA: Diagnosis not present

## 2018-05-30 DIAGNOSIS — D688 Other specified coagulation defects: Secondary | ICD-10-CM | POA: Diagnosis not present

## 2018-05-30 DIAGNOSIS — D509 Iron deficiency anemia, unspecified: Secondary | ICD-10-CM | POA: Diagnosis not present

## 2018-05-30 DIAGNOSIS — N2581 Secondary hyperparathyroidism of renal origin: Secondary | ICD-10-CM | POA: Diagnosis not present

## 2018-06-01 DIAGNOSIS — R51 Headache: Secondary | ICD-10-CM | POA: Diagnosis not present

## 2018-06-01 DIAGNOSIS — N2581 Secondary hyperparathyroidism of renal origin: Secondary | ICD-10-CM | POA: Diagnosis not present

## 2018-06-01 DIAGNOSIS — D688 Other specified coagulation defects: Secondary | ICD-10-CM | POA: Diagnosis not present

## 2018-06-01 DIAGNOSIS — E1129 Type 2 diabetes mellitus with other diabetic kidney complication: Secondary | ICD-10-CM | POA: Diagnosis not present

## 2018-06-01 DIAGNOSIS — D631 Anemia in chronic kidney disease: Secondary | ICD-10-CM | POA: Diagnosis not present

## 2018-06-01 DIAGNOSIS — N186 End stage renal disease: Secondary | ICD-10-CM | POA: Diagnosis not present

## 2018-06-01 DIAGNOSIS — D509 Iron deficiency anemia, unspecified: Secondary | ICD-10-CM | POA: Diagnosis not present

## 2018-06-01 DIAGNOSIS — R739 Hyperglycemia, unspecified: Secondary | ICD-10-CM | POA: Diagnosis not present

## 2018-06-03 DIAGNOSIS — R739 Hyperglycemia, unspecified: Secondary | ICD-10-CM | POA: Diagnosis not present

## 2018-06-03 DIAGNOSIS — N2581 Secondary hyperparathyroidism of renal origin: Secondary | ICD-10-CM | POA: Diagnosis not present

## 2018-06-03 DIAGNOSIS — N186 End stage renal disease: Secondary | ICD-10-CM | POA: Diagnosis not present

## 2018-06-03 DIAGNOSIS — E1129 Type 2 diabetes mellitus with other diabetic kidney complication: Secondary | ICD-10-CM | POA: Diagnosis not present

## 2018-06-03 DIAGNOSIS — D509 Iron deficiency anemia, unspecified: Secondary | ICD-10-CM | POA: Diagnosis not present

## 2018-06-03 DIAGNOSIS — R51 Headache: Secondary | ICD-10-CM | POA: Diagnosis not present

## 2018-06-03 DIAGNOSIS — D688 Other specified coagulation defects: Secondary | ICD-10-CM | POA: Diagnosis not present

## 2018-06-03 DIAGNOSIS — D631 Anemia in chronic kidney disease: Secondary | ICD-10-CM | POA: Diagnosis not present

## 2018-06-06 DIAGNOSIS — N2581 Secondary hyperparathyroidism of renal origin: Secondary | ICD-10-CM | POA: Diagnosis not present

## 2018-06-06 DIAGNOSIS — R51 Headache: Secondary | ICD-10-CM | POA: Diagnosis not present

## 2018-06-06 DIAGNOSIS — R739 Hyperglycemia, unspecified: Secondary | ICD-10-CM | POA: Diagnosis not present

## 2018-06-06 DIAGNOSIS — D509 Iron deficiency anemia, unspecified: Secondary | ICD-10-CM | POA: Diagnosis not present

## 2018-06-06 DIAGNOSIS — D631 Anemia in chronic kidney disease: Secondary | ICD-10-CM | POA: Diagnosis not present

## 2018-06-06 DIAGNOSIS — E1129 Type 2 diabetes mellitus with other diabetic kidney complication: Secondary | ICD-10-CM | POA: Diagnosis not present

## 2018-06-06 DIAGNOSIS — N186 End stage renal disease: Secondary | ICD-10-CM | POA: Diagnosis not present

## 2018-06-06 DIAGNOSIS — D688 Other specified coagulation defects: Secondary | ICD-10-CM | POA: Diagnosis not present

## 2018-06-08 DIAGNOSIS — R51 Headache: Secondary | ICD-10-CM | POA: Diagnosis not present

## 2018-06-08 DIAGNOSIS — D631 Anemia in chronic kidney disease: Secondary | ICD-10-CM | POA: Diagnosis not present

## 2018-06-08 DIAGNOSIS — D688 Other specified coagulation defects: Secondary | ICD-10-CM | POA: Diagnosis not present

## 2018-06-08 DIAGNOSIS — R739 Hyperglycemia, unspecified: Secondary | ICD-10-CM | POA: Diagnosis not present

## 2018-06-08 DIAGNOSIS — D509 Iron deficiency anemia, unspecified: Secondary | ICD-10-CM | POA: Diagnosis not present

## 2018-06-08 DIAGNOSIS — N2581 Secondary hyperparathyroidism of renal origin: Secondary | ICD-10-CM | POA: Diagnosis not present

## 2018-06-08 DIAGNOSIS — N186 End stage renal disease: Secondary | ICD-10-CM | POA: Diagnosis not present

## 2018-06-08 DIAGNOSIS — E1129 Type 2 diabetes mellitus with other diabetic kidney complication: Secondary | ICD-10-CM | POA: Diagnosis not present

## 2018-06-10 DIAGNOSIS — R51 Headache: Secondary | ICD-10-CM | POA: Diagnosis not present

## 2018-06-10 DIAGNOSIS — D509 Iron deficiency anemia, unspecified: Secondary | ICD-10-CM | POA: Diagnosis not present

## 2018-06-10 DIAGNOSIS — N2581 Secondary hyperparathyroidism of renal origin: Secondary | ICD-10-CM | POA: Diagnosis not present

## 2018-06-10 DIAGNOSIS — D688 Other specified coagulation defects: Secondary | ICD-10-CM | POA: Diagnosis not present

## 2018-06-10 DIAGNOSIS — E1129 Type 2 diabetes mellitus with other diabetic kidney complication: Secondary | ICD-10-CM | POA: Diagnosis not present

## 2018-06-10 DIAGNOSIS — N186 End stage renal disease: Secondary | ICD-10-CM | POA: Diagnosis not present

## 2018-06-10 DIAGNOSIS — R739 Hyperglycemia, unspecified: Secondary | ICD-10-CM | POA: Diagnosis not present

## 2018-06-10 DIAGNOSIS — D631 Anemia in chronic kidney disease: Secondary | ICD-10-CM | POA: Diagnosis not present

## 2018-06-13 DIAGNOSIS — E1129 Type 2 diabetes mellitus with other diabetic kidney complication: Secondary | ICD-10-CM | POA: Diagnosis not present

## 2018-06-13 DIAGNOSIS — D631 Anemia in chronic kidney disease: Secondary | ICD-10-CM | POA: Diagnosis not present

## 2018-06-13 DIAGNOSIS — D509 Iron deficiency anemia, unspecified: Secondary | ICD-10-CM | POA: Diagnosis not present

## 2018-06-13 DIAGNOSIS — N186 End stage renal disease: Secondary | ICD-10-CM | POA: Diagnosis not present

## 2018-06-13 DIAGNOSIS — D688 Other specified coagulation defects: Secondary | ICD-10-CM | POA: Diagnosis not present

## 2018-06-13 DIAGNOSIS — N2581 Secondary hyperparathyroidism of renal origin: Secondary | ICD-10-CM | POA: Diagnosis not present

## 2018-06-13 DIAGNOSIS — R739 Hyperglycemia, unspecified: Secondary | ICD-10-CM | POA: Diagnosis not present

## 2018-06-13 DIAGNOSIS — R51 Headache: Secondary | ICD-10-CM | POA: Diagnosis not present

## 2018-06-15 DIAGNOSIS — R739 Hyperglycemia, unspecified: Secondary | ICD-10-CM | POA: Diagnosis not present

## 2018-06-15 DIAGNOSIS — R51 Headache: Secondary | ICD-10-CM | POA: Diagnosis not present

## 2018-06-15 DIAGNOSIS — N2581 Secondary hyperparathyroidism of renal origin: Secondary | ICD-10-CM | POA: Diagnosis not present

## 2018-06-15 DIAGNOSIS — D631 Anemia in chronic kidney disease: Secondary | ICD-10-CM | POA: Diagnosis not present

## 2018-06-15 DIAGNOSIS — D688 Other specified coagulation defects: Secondary | ICD-10-CM | POA: Diagnosis not present

## 2018-06-15 DIAGNOSIS — E1129 Type 2 diabetes mellitus with other diabetic kidney complication: Secondary | ICD-10-CM | POA: Diagnosis not present

## 2018-06-15 DIAGNOSIS — D509 Iron deficiency anemia, unspecified: Secondary | ICD-10-CM | POA: Diagnosis not present

## 2018-06-15 DIAGNOSIS — N186 End stage renal disease: Secondary | ICD-10-CM | POA: Diagnosis not present

## 2018-06-17 DIAGNOSIS — D509 Iron deficiency anemia, unspecified: Secondary | ICD-10-CM | POA: Diagnosis not present

## 2018-06-17 DIAGNOSIS — R51 Headache: Secondary | ICD-10-CM | POA: Diagnosis not present

## 2018-06-17 DIAGNOSIS — E1129 Type 2 diabetes mellitus with other diabetic kidney complication: Secondary | ICD-10-CM | POA: Diagnosis not present

## 2018-06-17 DIAGNOSIS — R739 Hyperglycemia, unspecified: Secondary | ICD-10-CM | POA: Diagnosis not present

## 2018-06-17 DIAGNOSIS — N2581 Secondary hyperparathyroidism of renal origin: Secondary | ICD-10-CM | POA: Diagnosis not present

## 2018-06-17 DIAGNOSIS — N186 End stage renal disease: Secondary | ICD-10-CM | POA: Diagnosis not present

## 2018-06-17 DIAGNOSIS — Z992 Dependence on renal dialysis: Secondary | ICD-10-CM | POA: Diagnosis not present

## 2018-06-17 DIAGNOSIS — D631 Anemia in chronic kidney disease: Secondary | ICD-10-CM | POA: Diagnosis not present

## 2018-06-17 DIAGNOSIS — D688 Other specified coagulation defects: Secondary | ICD-10-CM | POA: Diagnosis not present

## 2018-06-20 DIAGNOSIS — E1129 Type 2 diabetes mellitus with other diabetic kidney complication: Secondary | ICD-10-CM | POA: Diagnosis not present

## 2018-06-20 DIAGNOSIS — N2581 Secondary hyperparathyroidism of renal origin: Secondary | ICD-10-CM | POA: Diagnosis not present

## 2018-06-20 DIAGNOSIS — D631 Anemia in chronic kidney disease: Secondary | ICD-10-CM | POA: Diagnosis not present

## 2018-06-20 DIAGNOSIS — D509 Iron deficiency anemia, unspecified: Secondary | ICD-10-CM | POA: Diagnosis not present

## 2018-06-20 DIAGNOSIS — N186 End stage renal disease: Secondary | ICD-10-CM | POA: Diagnosis not present

## 2018-06-20 DIAGNOSIS — D688 Other specified coagulation defects: Secondary | ICD-10-CM | POA: Diagnosis not present

## 2018-06-22 DIAGNOSIS — D631 Anemia in chronic kidney disease: Secondary | ICD-10-CM | POA: Diagnosis not present

## 2018-06-22 DIAGNOSIS — N186 End stage renal disease: Secondary | ICD-10-CM | POA: Diagnosis not present

## 2018-06-22 DIAGNOSIS — N2581 Secondary hyperparathyroidism of renal origin: Secondary | ICD-10-CM | POA: Diagnosis not present

## 2018-06-22 DIAGNOSIS — D688 Other specified coagulation defects: Secondary | ICD-10-CM | POA: Diagnosis not present

## 2018-06-22 DIAGNOSIS — E1129 Type 2 diabetes mellitus with other diabetic kidney complication: Secondary | ICD-10-CM | POA: Diagnosis not present

## 2018-06-22 DIAGNOSIS — D509 Iron deficiency anemia, unspecified: Secondary | ICD-10-CM | POA: Diagnosis not present

## 2018-06-24 DIAGNOSIS — E1129 Type 2 diabetes mellitus with other diabetic kidney complication: Secondary | ICD-10-CM | POA: Diagnosis not present

## 2018-06-24 DIAGNOSIS — N2581 Secondary hyperparathyroidism of renal origin: Secondary | ICD-10-CM | POA: Diagnosis not present

## 2018-06-24 DIAGNOSIS — D688 Other specified coagulation defects: Secondary | ICD-10-CM | POA: Diagnosis not present

## 2018-06-24 DIAGNOSIS — D631 Anemia in chronic kidney disease: Secondary | ICD-10-CM | POA: Diagnosis not present

## 2018-06-24 DIAGNOSIS — N186 End stage renal disease: Secondary | ICD-10-CM | POA: Diagnosis not present

## 2018-06-24 DIAGNOSIS — D509 Iron deficiency anemia, unspecified: Secondary | ICD-10-CM | POA: Diagnosis not present

## 2018-06-27 DIAGNOSIS — N2581 Secondary hyperparathyroidism of renal origin: Secondary | ICD-10-CM | POA: Diagnosis not present

## 2018-06-27 DIAGNOSIS — E1129 Type 2 diabetes mellitus with other diabetic kidney complication: Secondary | ICD-10-CM | POA: Diagnosis not present

## 2018-06-27 DIAGNOSIS — D509 Iron deficiency anemia, unspecified: Secondary | ICD-10-CM | POA: Diagnosis not present

## 2018-06-27 DIAGNOSIS — N186 End stage renal disease: Secondary | ICD-10-CM | POA: Diagnosis not present

## 2018-06-27 DIAGNOSIS — D688 Other specified coagulation defects: Secondary | ICD-10-CM | POA: Diagnosis not present

## 2018-06-27 DIAGNOSIS — D631 Anemia in chronic kidney disease: Secondary | ICD-10-CM | POA: Diagnosis not present

## 2018-06-29 DIAGNOSIS — D688 Other specified coagulation defects: Secondary | ICD-10-CM | POA: Diagnosis not present

## 2018-06-29 DIAGNOSIS — E1129 Type 2 diabetes mellitus with other diabetic kidney complication: Secondary | ICD-10-CM | POA: Diagnosis not present

## 2018-06-29 DIAGNOSIS — N186 End stage renal disease: Secondary | ICD-10-CM | POA: Diagnosis not present

## 2018-06-29 DIAGNOSIS — D631 Anemia in chronic kidney disease: Secondary | ICD-10-CM | POA: Diagnosis not present

## 2018-06-29 DIAGNOSIS — N2581 Secondary hyperparathyroidism of renal origin: Secondary | ICD-10-CM | POA: Diagnosis not present

## 2018-06-29 DIAGNOSIS — D509 Iron deficiency anemia, unspecified: Secondary | ICD-10-CM | POA: Diagnosis not present

## 2018-07-01 DIAGNOSIS — E1129 Type 2 diabetes mellitus with other diabetic kidney complication: Secondary | ICD-10-CM | POA: Diagnosis not present

## 2018-07-01 DIAGNOSIS — N186 End stage renal disease: Secondary | ICD-10-CM | POA: Diagnosis not present

## 2018-07-01 DIAGNOSIS — D631 Anemia in chronic kidney disease: Secondary | ICD-10-CM | POA: Diagnosis not present

## 2018-07-01 DIAGNOSIS — N2581 Secondary hyperparathyroidism of renal origin: Secondary | ICD-10-CM | POA: Diagnosis not present

## 2018-07-01 DIAGNOSIS — D688 Other specified coagulation defects: Secondary | ICD-10-CM | POA: Diagnosis not present

## 2018-07-01 DIAGNOSIS — D509 Iron deficiency anemia, unspecified: Secondary | ICD-10-CM | POA: Diagnosis not present

## 2018-07-04 DIAGNOSIS — D631 Anemia in chronic kidney disease: Secondary | ICD-10-CM | POA: Diagnosis not present

## 2018-07-04 DIAGNOSIS — N2581 Secondary hyperparathyroidism of renal origin: Secondary | ICD-10-CM | POA: Diagnosis not present

## 2018-07-04 DIAGNOSIS — D688 Other specified coagulation defects: Secondary | ICD-10-CM | POA: Diagnosis not present

## 2018-07-04 DIAGNOSIS — D509 Iron deficiency anemia, unspecified: Secondary | ICD-10-CM | POA: Diagnosis not present

## 2018-07-04 DIAGNOSIS — N186 End stage renal disease: Secondary | ICD-10-CM | POA: Diagnosis not present

## 2018-07-04 DIAGNOSIS — E1129 Type 2 diabetes mellitus with other diabetic kidney complication: Secondary | ICD-10-CM | POA: Diagnosis not present

## 2018-07-06 DIAGNOSIS — N2581 Secondary hyperparathyroidism of renal origin: Secondary | ICD-10-CM | POA: Diagnosis not present

## 2018-07-06 DIAGNOSIS — N186 End stage renal disease: Secondary | ICD-10-CM | POA: Diagnosis not present

## 2018-07-06 DIAGNOSIS — E1129 Type 2 diabetes mellitus with other diabetic kidney complication: Secondary | ICD-10-CM | POA: Diagnosis not present

## 2018-07-06 DIAGNOSIS — D688 Other specified coagulation defects: Secondary | ICD-10-CM | POA: Diagnosis not present

## 2018-07-06 DIAGNOSIS — D631 Anemia in chronic kidney disease: Secondary | ICD-10-CM | POA: Diagnosis not present

## 2018-07-06 DIAGNOSIS — D509 Iron deficiency anemia, unspecified: Secondary | ICD-10-CM | POA: Diagnosis not present

## 2018-07-08 DIAGNOSIS — N2581 Secondary hyperparathyroidism of renal origin: Secondary | ICD-10-CM | POA: Diagnosis not present

## 2018-07-08 DIAGNOSIS — N186 End stage renal disease: Secondary | ICD-10-CM | POA: Diagnosis not present

## 2018-07-08 DIAGNOSIS — E1129 Type 2 diabetes mellitus with other diabetic kidney complication: Secondary | ICD-10-CM | POA: Diagnosis not present

## 2018-07-08 DIAGNOSIS — D688 Other specified coagulation defects: Secondary | ICD-10-CM | POA: Diagnosis not present

## 2018-07-08 DIAGNOSIS — D509 Iron deficiency anemia, unspecified: Secondary | ICD-10-CM | POA: Diagnosis not present

## 2018-07-08 DIAGNOSIS — D631 Anemia in chronic kidney disease: Secondary | ICD-10-CM | POA: Diagnosis not present

## 2018-07-10 DIAGNOSIS — M65341 Trigger finger, right ring finger: Secondary | ICD-10-CM | POA: Insufficient documentation

## 2018-07-10 DIAGNOSIS — M79641 Pain in right hand: Secondary | ICD-10-CM | POA: Diagnosis not present

## 2018-07-11 DIAGNOSIS — E1129 Type 2 diabetes mellitus with other diabetic kidney complication: Secondary | ICD-10-CM | POA: Diagnosis not present

## 2018-07-11 DIAGNOSIS — N2581 Secondary hyperparathyroidism of renal origin: Secondary | ICD-10-CM | POA: Diagnosis not present

## 2018-07-11 DIAGNOSIS — D631 Anemia in chronic kidney disease: Secondary | ICD-10-CM | POA: Diagnosis not present

## 2018-07-11 DIAGNOSIS — N186 End stage renal disease: Secondary | ICD-10-CM | POA: Diagnosis not present

## 2018-07-11 DIAGNOSIS — D688 Other specified coagulation defects: Secondary | ICD-10-CM | POA: Diagnosis not present

## 2018-07-11 DIAGNOSIS — D509 Iron deficiency anemia, unspecified: Secondary | ICD-10-CM | POA: Diagnosis not present

## 2018-07-13 DIAGNOSIS — D688 Other specified coagulation defects: Secondary | ICD-10-CM | POA: Diagnosis not present

## 2018-07-13 DIAGNOSIS — N2581 Secondary hyperparathyroidism of renal origin: Secondary | ICD-10-CM | POA: Diagnosis not present

## 2018-07-13 DIAGNOSIS — E1129 Type 2 diabetes mellitus with other diabetic kidney complication: Secondary | ICD-10-CM | POA: Diagnosis not present

## 2018-07-13 DIAGNOSIS — D631 Anemia in chronic kidney disease: Secondary | ICD-10-CM | POA: Diagnosis not present

## 2018-07-13 DIAGNOSIS — D509 Iron deficiency anemia, unspecified: Secondary | ICD-10-CM | POA: Diagnosis not present

## 2018-07-13 DIAGNOSIS — N186 End stage renal disease: Secondary | ICD-10-CM | POA: Diagnosis not present

## 2018-07-15 DIAGNOSIS — D509 Iron deficiency anemia, unspecified: Secondary | ICD-10-CM | POA: Diagnosis not present

## 2018-07-15 DIAGNOSIS — D688 Other specified coagulation defects: Secondary | ICD-10-CM | POA: Diagnosis not present

## 2018-07-15 DIAGNOSIS — D631 Anemia in chronic kidney disease: Secondary | ICD-10-CM | POA: Diagnosis not present

## 2018-07-15 DIAGNOSIS — E1129 Type 2 diabetes mellitus with other diabetic kidney complication: Secondary | ICD-10-CM | POA: Diagnosis not present

## 2018-07-15 DIAGNOSIS — N2581 Secondary hyperparathyroidism of renal origin: Secondary | ICD-10-CM | POA: Diagnosis not present

## 2018-07-15 DIAGNOSIS — N186 End stage renal disease: Secondary | ICD-10-CM | POA: Diagnosis not present

## 2018-07-18 DIAGNOSIS — E1129 Type 2 diabetes mellitus with other diabetic kidney complication: Secondary | ICD-10-CM | POA: Diagnosis not present

## 2018-07-18 DIAGNOSIS — Z992 Dependence on renal dialysis: Secondary | ICD-10-CM | POA: Diagnosis not present

## 2018-07-18 DIAGNOSIS — D631 Anemia in chronic kidney disease: Secondary | ICD-10-CM | POA: Diagnosis not present

## 2018-07-18 DIAGNOSIS — N186 End stage renal disease: Secondary | ICD-10-CM | POA: Diagnosis not present

## 2018-07-18 DIAGNOSIS — D509 Iron deficiency anemia, unspecified: Secondary | ICD-10-CM | POA: Diagnosis not present

## 2018-07-18 DIAGNOSIS — N2581 Secondary hyperparathyroidism of renal origin: Secondary | ICD-10-CM | POA: Diagnosis not present

## 2018-07-18 DIAGNOSIS — D688 Other specified coagulation defects: Secondary | ICD-10-CM | POA: Diagnosis not present

## 2018-07-19 ENCOUNTER — Other Ambulatory Visit: Payer: Self-pay | Admitting: Family Medicine

## 2018-07-20 DIAGNOSIS — D631 Anemia in chronic kidney disease: Secondary | ICD-10-CM | POA: Diagnosis not present

## 2018-07-20 DIAGNOSIS — D509 Iron deficiency anemia, unspecified: Secondary | ICD-10-CM | POA: Diagnosis not present

## 2018-07-20 DIAGNOSIS — D688 Other specified coagulation defects: Secondary | ICD-10-CM | POA: Diagnosis not present

## 2018-07-20 DIAGNOSIS — R51 Headache: Secondary | ICD-10-CM | POA: Diagnosis not present

## 2018-07-20 DIAGNOSIS — N2581 Secondary hyperparathyroidism of renal origin: Secondary | ICD-10-CM | POA: Diagnosis not present

## 2018-07-20 DIAGNOSIS — N186 End stage renal disease: Secondary | ICD-10-CM | POA: Diagnosis not present

## 2018-07-20 DIAGNOSIS — E1129 Type 2 diabetes mellitus with other diabetic kidney complication: Secondary | ICD-10-CM | POA: Diagnosis not present

## 2018-07-22 DIAGNOSIS — D509 Iron deficiency anemia, unspecified: Secondary | ICD-10-CM | POA: Diagnosis not present

## 2018-07-22 DIAGNOSIS — E1129 Type 2 diabetes mellitus with other diabetic kidney complication: Secondary | ICD-10-CM | POA: Diagnosis not present

## 2018-07-22 DIAGNOSIS — N186 End stage renal disease: Secondary | ICD-10-CM | POA: Diagnosis not present

## 2018-07-22 DIAGNOSIS — N2581 Secondary hyperparathyroidism of renal origin: Secondary | ICD-10-CM | POA: Diagnosis not present

## 2018-07-22 DIAGNOSIS — R51 Headache: Secondary | ICD-10-CM | POA: Diagnosis not present

## 2018-07-22 DIAGNOSIS — D631 Anemia in chronic kidney disease: Secondary | ICD-10-CM | POA: Diagnosis not present

## 2018-07-22 DIAGNOSIS — D688 Other specified coagulation defects: Secondary | ICD-10-CM | POA: Diagnosis not present

## 2018-07-25 DIAGNOSIS — R51 Headache: Secondary | ICD-10-CM | POA: Diagnosis not present

## 2018-07-25 DIAGNOSIS — D631 Anemia in chronic kidney disease: Secondary | ICD-10-CM | POA: Diagnosis not present

## 2018-07-25 DIAGNOSIS — E1129 Type 2 diabetes mellitus with other diabetic kidney complication: Secondary | ICD-10-CM | POA: Diagnosis not present

## 2018-07-25 DIAGNOSIS — N2581 Secondary hyperparathyroidism of renal origin: Secondary | ICD-10-CM | POA: Diagnosis not present

## 2018-07-25 DIAGNOSIS — D688 Other specified coagulation defects: Secondary | ICD-10-CM | POA: Diagnosis not present

## 2018-07-25 DIAGNOSIS — D509 Iron deficiency anemia, unspecified: Secondary | ICD-10-CM | POA: Diagnosis not present

## 2018-07-25 DIAGNOSIS — N186 End stage renal disease: Secondary | ICD-10-CM | POA: Diagnosis not present

## 2018-07-26 ENCOUNTER — Encounter: Payer: Self-pay | Admitting: Family Medicine

## 2018-07-26 ENCOUNTER — Other Ambulatory Visit: Payer: Self-pay

## 2018-07-26 ENCOUNTER — Telehealth (INDEPENDENT_AMBULATORY_CARE_PROVIDER_SITE_OTHER): Payer: Medicare Other | Admitting: Family Medicine

## 2018-07-26 DIAGNOSIS — G608 Other hereditary and idiopathic neuropathies: Secondary | ICD-10-CM

## 2018-07-26 DIAGNOSIS — G629 Polyneuropathy, unspecified: Secondary | ICD-10-CM | POA: Diagnosis not present

## 2018-07-26 DIAGNOSIS — E1140 Type 2 diabetes mellitus with diabetic neuropathy, unspecified: Secondary | ICD-10-CM

## 2018-07-26 MED ORDER — GABAPENTIN 100 MG PO CAPS: ORAL_CAPSULE | ORAL | 0 refills | Status: DC

## 2018-07-26 NOTE — Assessment & Plan Note (Signed)
Established problem Uncontrolled Gradual taper up to max gabapentin dose in ESRD Gabapentin 100 mg capsules, 2 caps qhs x 7d.  3 caps qhs until told o/w  Discussed common side effect including sadation, confusion and balance impairment/falls.  Asked Ms Devargas to contact us if she has concerns.   Asked Ms Gurevich to call [facility] in 2 weeks to reports effectiveness of increase dose on pain and tolerance of medication

## 2018-07-26 NOTE — Progress Notes (Signed)
[place] Telemedicine Visit  Patient consented to have visit conducted via telephone.  Encounter participants: Patient: Rachel Hobbs  Location of patient: Patient Location: Home Provider: Sherren Mocha Abhi Moccia  Others (if applicable): none  Chief Complaint: painful feet  HPI:  Chronic, longstanding diabetic neuropathy pain in feet.   Currently taking gabapentin between 100-200 mg qhs since06/2019.  Currently taking 100 mg qhs with little pain relief in feet.   Denies falls, unbalanced, confusion since being on gabapentin.  Told by [facility] home visiting nurse to contact her physcian about this unresolved issue with her feet.    ROS: No fever, no cough  Pertinent PMHx:  Painful Diabetic peripheral neuropathy ESRD  Exam:  Respiratory: speaking in full sentence, no audible wheeze, voice prosodic   Assessment/Plan:  Painful diabetic neuropathy (HCC) Established problem Uncontrolled Gradual taper up to max gabapentin dose in ESRD Gabapentin 100 mg capsules, 2 caps qhs x 7d.  3 caps qhs until told o/w  Discussed common side effect including sadation, confusion and balance impairment/falls.  Asked Ms Coulston to contact us if she has concerns.   Asked Ms Adcox to call [facility] in 2 weeks to reports effectiveness of increase dose on pain and tolerance of medication     Time spent on phone with patient: 6 minutes

## 2018-07-27 DIAGNOSIS — D509 Iron deficiency anemia, unspecified: Secondary | ICD-10-CM | POA: Diagnosis not present

## 2018-07-27 DIAGNOSIS — N2581 Secondary hyperparathyroidism of renal origin: Secondary | ICD-10-CM | POA: Diagnosis not present

## 2018-07-27 DIAGNOSIS — N186 End stage renal disease: Secondary | ICD-10-CM | POA: Diagnosis not present

## 2018-07-27 DIAGNOSIS — D688 Other specified coagulation defects: Secondary | ICD-10-CM | POA: Diagnosis not present

## 2018-07-27 DIAGNOSIS — D631 Anemia in chronic kidney disease: Secondary | ICD-10-CM | POA: Diagnosis not present

## 2018-07-27 DIAGNOSIS — E1129 Type 2 diabetes mellitus with other diabetic kidney complication: Secondary | ICD-10-CM | POA: Diagnosis not present

## 2018-07-27 DIAGNOSIS — R51 Headache: Secondary | ICD-10-CM | POA: Diagnosis not present

## 2018-07-29 DIAGNOSIS — R51 Headache: Secondary | ICD-10-CM | POA: Diagnosis not present

## 2018-07-29 DIAGNOSIS — D509 Iron deficiency anemia, unspecified: Secondary | ICD-10-CM | POA: Diagnosis not present

## 2018-07-29 DIAGNOSIS — D688 Other specified coagulation defects: Secondary | ICD-10-CM | POA: Diagnosis not present

## 2018-07-29 DIAGNOSIS — E1129 Type 2 diabetes mellitus with other diabetic kidney complication: Secondary | ICD-10-CM | POA: Diagnosis not present

## 2018-07-29 DIAGNOSIS — N2581 Secondary hyperparathyroidism of renal origin: Secondary | ICD-10-CM | POA: Diagnosis not present

## 2018-07-29 DIAGNOSIS — D631 Anemia in chronic kidney disease: Secondary | ICD-10-CM | POA: Diagnosis not present

## 2018-07-29 DIAGNOSIS — N186 End stage renal disease: Secondary | ICD-10-CM | POA: Diagnosis not present

## 2018-08-01 DIAGNOSIS — N186 End stage renal disease: Secondary | ICD-10-CM | POA: Diagnosis not present

## 2018-08-01 DIAGNOSIS — D688 Other specified coagulation defects: Secondary | ICD-10-CM | POA: Diagnosis not present

## 2018-08-01 DIAGNOSIS — E1129 Type 2 diabetes mellitus with other diabetic kidney complication: Secondary | ICD-10-CM | POA: Diagnosis not present

## 2018-08-01 DIAGNOSIS — D631 Anemia in chronic kidney disease: Secondary | ICD-10-CM | POA: Diagnosis not present

## 2018-08-01 DIAGNOSIS — R51 Headache: Secondary | ICD-10-CM | POA: Diagnosis not present

## 2018-08-01 DIAGNOSIS — N2581 Secondary hyperparathyroidism of renal origin: Secondary | ICD-10-CM | POA: Diagnosis not present

## 2018-08-01 DIAGNOSIS — D509 Iron deficiency anemia, unspecified: Secondary | ICD-10-CM | POA: Diagnosis not present

## 2018-08-03 DIAGNOSIS — E1129 Type 2 diabetes mellitus with other diabetic kidney complication: Secondary | ICD-10-CM | POA: Diagnosis not present

## 2018-08-03 DIAGNOSIS — D509 Iron deficiency anemia, unspecified: Secondary | ICD-10-CM | POA: Diagnosis not present

## 2018-08-03 DIAGNOSIS — D688 Other specified coagulation defects: Secondary | ICD-10-CM | POA: Diagnosis not present

## 2018-08-03 DIAGNOSIS — R51 Headache: Secondary | ICD-10-CM | POA: Diagnosis not present

## 2018-08-03 DIAGNOSIS — D631 Anemia in chronic kidney disease: Secondary | ICD-10-CM | POA: Diagnosis not present

## 2018-08-03 DIAGNOSIS — N2581 Secondary hyperparathyroidism of renal origin: Secondary | ICD-10-CM | POA: Diagnosis not present

## 2018-08-03 DIAGNOSIS — N186 End stage renal disease: Secondary | ICD-10-CM | POA: Diagnosis not present

## 2018-08-05 DIAGNOSIS — R51 Headache: Secondary | ICD-10-CM | POA: Diagnosis not present

## 2018-08-05 DIAGNOSIS — D631 Anemia in chronic kidney disease: Secondary | ICD-10-CM | POA: Diagnosis not present

## 2018-08-05 DIAGNOSIS — N2581 Secondary hyperparathyroidism of renal origin: Secondary | ICD-10-CM | POA: Diagnosis not present

## 2018-08-05 DIAGNOSIS — D688 Other specified coagulation defects: Secondary | ICD-10-CM | POA: Diagnosis not present

## 2018-08-05 DIAGNOSIS — D509 Iron deficiency anemia, unspecified: Secondary | ICD-10-CM | POA: Diagnosis not present

## 2018-08-05 DIAGNOSIS — E1129 Type 2 diabetes mellitus with other diabetic kidney complication: Secondary | ICD-10-CM | POA: Diagnosis not present

## 2018-08-05 DIAGNOSIS — N186 End stage renal disease: Secondary | ICD-10-CM | POA: Diagnosis not present

## 2018-08-08 DIAGNOSIS — E1129 Type 2 diabetes mellitus with other diabetic kidney complication: Secondary | ICD-10-CM | POA: Diagnosis not present

## 2018-08-08 DIAGNOSIS — D631 Anemia in chronic kidney disease: Secondary | ICD-10-CM | POA: Diagnosis not present

## 2018-08-08 DIAGNOSIS — D688 Other specified coagulation defects: Secondary | ICD-10-CM | POA: Diagnosis not present

## 2018-08-08 DIAGNOSIS — N186 End stage renal disease: Secondary | ICD-10-CM | POA: Diagnosis not present

## 2018-08-08 DIAGNOSIS — N2581 Secondary hyperparathyroidism of renal origin: Secondary | ICD-10-CM | POA: Diagnosis not present

## 2018-08-08 DIAGNOSIS — D509 Iron deficiency anemia, unspecified: Secondary | ICD-10-CM | POA: Diagnosis not present

## 2018-08-08 DIAGNOSIS — R51 Headache: Secondary | ICD-10-CM | POA: Diagnosis not present

## 2018-08-10 DIAGNOSIS — D631 Anemia in chronic kidney disease: Secondary | ICD-10-CM | POA: Diagnosis not present

## 2018-08-10 DIAGNOSIS — D509 Iron deficiency anemia, unspecified: Secondary | ICD-10-CM | POA: Diagnosis not present

## 2018-08-10 DIAGNOSIS — N186 End stage renal disease: Secondary | ICD-10-CM | POA: Diagnosis not present

## 2018-08-10 DIAGNOSIS — D688 Other specified coagulation defects: Secondary | ICD-10-CM | POA: Diagnosis not present

## 2018-08-10 DIAGNOSIS — E1129 Type 2 diabetes mellitus with other diabetic kidney complication: Secondary | ICD-10-CM | POA: Diagnosis not present

## 2018-08-10 DIAGNOSIS — N2581 Secondary hyperparathyroidism of renal origin: Secondary | ICD-10-CM | POA: Diagnosis not present

## 2018-08-10 DIAGNOSIS — R51 Headache: Secondary | ICD-10-CM | POA: Diagnosis not present

## 2018-08-12 DIAGNOSIS — D631 Anemia in chronic kidney disease: Secondary | ICD-10-CM | POA: Diagnosis not present

## 2018-08-12 DIAGNOSIS — D688 Other specified coagulation defects: Secondary | ICD-10-CM | POA: Diagnosis not present

## 2018-08-12 DIAGNOSIS — N2581 Secondary hyperparathyroidism of renal origin: Secondary | ICD-10-CM | POA: Diagnosis not present

## 2018-08-12 DIAGNOSIS — D509 Iron deficiency anemia, unspecified: Secondary | ICD-10-CM | POA: Diagnosis not present

## 2018-08-12 DIAGNOSIS — R51 Headache: Secondary | ICD-10-CM | POA: Diagnosis not present

## 2018-08-12 DIAGNOSIS — N186 End stage renal disease: Secondary | ICD-10-CM | POA: Diagnosis not present

## 2018-08-12 DIAGNOSIS — E1129 Type 2 diabetes mellitus with other diabetic kidney complication: Secondary | ICD-10-CM | POA: Diagnosis not present

## 2018-08-15 DIAGNOSIS — N2581 Secondary hyperparathyroidism of renal origin: Secondary | ICD-10-CM | POA: Diagnosis not present

## 2018-08-15 DIAGNOSIS — D688 Other specified coagulation defects: Secondary | ICD-10-CM | POA: Diagnosis not present

## 2018-08-15 DIAGNOSIS — D509 Iron deficiency anemia, unspecified: Secondary | ICD-10-CM | POA: Diagnosis not present

## 2018-08-15 DIAGNOSIS — D631 Anemia in chronic kidney disease: Secondary | ICD-10-CM | POA: Diagnosis not present

## 2018-08-15 DIAGNOSIS — R51 Headache: Secondary | ICD-10-CM | POA: Diagnosis not present

## 2018-08-15 DIAGNOSIS — E1129 Type 2 diabetes mellitus with other diabetic kidney complication: Secondary | ICD-10-CM | POA: Diagnosis not present

## 2018-08-15 DIAGNOSIS — N186 End stage renal disease: Secondary | ICD-10-CM | POA: Diagnosis not present

## 2018-08-16 ENCOUNTER — Other Ambulatory Visit: Payer: Self-pay | Admitting: *Deleted

## 2018-08-16 MED ORDER — ATORVASTATIN CALCIUM 40 MG PO TABS
ORAL_TABLET | ORAL | 0 refills | Status: DC
Start: 2018-08-16 — End: 2018-10-27

## 2018-08-17 DIAGNOSIS — N2581 Secondary hyperparathyroidism of renal origin: Secondary | ICD-10-CM | POA: Diagnosis not present

## 2018-08-17 DIAGNOSIS — R51 Headache: Secondary | ICD-10-CM | POA: Diagnosis not present

## 2018-08-17 DIAGNOSIS — D509 Iron deficiency anemia, unspecified: Secondary | ICD-10-CM | POA: Diagnosis not present

## 2018-08-17 DIAGNOSIS — N186 End stage renal disease: Secondary | ICD-10-CM | POA: Diagnosis not present

## 2018-08-17 DIAGNOSIS — Z992 Dependence on renal dialysis: Secondary | ICD-10-CM | POA: Diagnosis not present

## 2018-08-17 DIAGNOSIS — D631 Anemia in chronic kidney disease: Secondary | ICD-10-CM | POA: Diagnosis not present

## 2018-08-17 DIAGNOSIS — E1129 Type 2 diabetes mellitus with other diabetic kidney complication: Secondary | ICD-10-CM | POA: Diagnosis not present

## 2018-08-17 DIAGNOSIS — D688 Other specified coagulation defects: Secondary | ICD-10-CM | POA: Diagnosis not present

## 2018-08-19 DIAGNOSIS — N186 End stage renal disease: Secondary | ICD-10-CM | POA: Diagnosis not present

## 2018-08-19 DIAGNOSIS — D688 Other specified coagulation defects: Secondary | ICD-10-CM | POA: Diagnosis not present

## 2018-08-19 DIAGNOSIS — D509 Iron deficiency anemia, unspecified: Secondary | ICD-10-CM | POA: Diagnosis not present

## 2018-08-19 DIAGNOSIS — N2581 Secondary hyperparathyroidism of renal origin: Secondary | ICD-10-CM | POA: Diagnosis not present

## 2018-08-19 DIAGNOSIS — D631 Anemia in chronic kidney disease: Secondary | ICD-10-CM | POA: Diagnosis not present

## 2018-08-19 DIAGNOSIS — E1129 Type 2 diabetes mellitus with other diabetic kidney complication: Secondary | ICD-10-CM | POA: Diagnosis not present

## 2018-08-19 DIAGNOSIS — R739 Hyperglycemia, unspecified: Secondary | ICD-10-CM | POA: Diagnosis not present

## 2018-08-22 DIAGNOSIS — D688 Other specified coagulation defects: Secondary | ICD-10-CM | POA: Diagnosis not present

## 2018-08-22 DIAGNOSIS — R739 Hyperglycemia, unspecified: Secondary | ICD-10-CM | POA: Diagnosis not present

## 2018-08-22 DIAGNOSIS — N186 End stage renal disease: Secondary | ICD-10-CM | POA: Diagnosis not present

## 2018-08-22 DIAGNOSIS — N2581 Secondary hyperparathyroidism of renal origin: Secondary | ICD-10-CM | POA: Diagnosis not present

## 2018-08-22 DIAGNOSIS — D509 Iron deficiency anemia, unspecified: Secondary | ICD-10-CM | POA: Diagnosis not present

## 2018-08-22 DIAGNOSIS — D631 Anemia in chronic kidney disease: Secondary | ICD-10-CM | POA: Diagnosis not present

## 2018-08-22 DIAGNOSIS — E1129 Type 2 diabetes mellitus with other diabetic kidney complication: Secondary | ICD-10-CM | POA: Diagnosis not present

## 2018-08-24 DIAGNOSIS — N186 End stage renal disease: Secondary | ICD-10-CM | POA: Diagnosis not present

## 2018-08-24 DIAGNOSIS — E1129 Type 2 diabetes mellitus with other diabetic kidney complication: Secondary | ICD-10-CM | POA: Diagnosis not present

## 2018-08-24 DIAGNOSIS — R739 Hyperglycemia, unspecified: Secondary | ICD-10-CM | POA: Diagnosis not present

## 2018-08-24 DIAGNOSIS — D509 Iron deficiency anemia, unspecified: Secondary | ICD-10-CM | POA: Diagnosis not present

## 2018-08-24 DIAGNOSIS — N2581 Secondary hyperparathyroidism of renal origin: Secondary | ICD-10-CM | POA: Diagnosis not present

## 2018-08-24 DIAGNOSIS — D631 Anemia in chronic kidney disease: Secondary | ICD-10-CM | POA: Diagnosis not present

## 2018-08-24 DIAGNOSIS — D688 Other specified coagulation defects: Secondary | ICD-10-CM | POA: Diagnosis not present

## 2018-08-26 DIAGNOSIS — D631 Anemia in chronic kidney disease: Secondary | ICD-10-CM | POA: Diagnosis not present

## 2018-08-26 DIAGNOSIS — D509 Iron deficiency anemia, unspecified: Secondary | ICD-10-CM | POA: Diagnosis not present

## 2018-08-26 DIAGNOSIS — D688 Other specified coagulation defects: Secondary | ICD-10-CM | POA: Diagnosis not present

## 2018-08-26 DIAGNOSIS — N186 End stage renal disease: Secondary | ICD-10-CM | POA: Diagnosis not present

## 2018-08-26 DIAGNOSIS — E1129 Type 2 diabetes mellitus with other diabetic kidney complication: Secondary | ICD-10-CM | POA: Diagnosis not present

## 2018-08-26 DIAGNOSIS — N2581 Secondary hyperparathyroidism of renal origin: Secondary | ICD-10-CM | POA: Diagnosis not present

## 2018-08-26 DIAGNOSIS — R739 Hyperglycemia, unspecified: Secondary | ICD-10-CM | POA: Diagnosis not present

## 2018-08-29 DIAGNOSIS — E1129 Type 2 diabetes mellitus with other diabetic kidney complication: Secondary | ICD-10-CM | POA: Diagnosis not present

## 2018-08-29 DIAGNOSIS — D509 Iron deficiency anemia, unspecified: Secondary | ICD-10-CM | POA: Diagnosis not present

## 2018-08-29 DIAGNOSIS — N2581 Secondary hyperparathyroidism of renal origin: Secondary | ICD-10-CM | POA: Diagnosis not present

## 2018-08-29 DIAGNOSIS — N186 End stage renal disease: Secondary | ICD-10-CM | POA: Diagnosis not present

## 2018-08-29 DIAGNOSIS — D688 Other specified coagulation defects: Secondary | ICD-10-CM | POA: Diagnosis not present

## 2018-08-29 DIAGNOSIS — R739 Hyperglycemia, unspecified: Secondary | ICD-10-CM | POA: Diagnosis not present

## 2018-08-29 DIAGNOSIS — D631 Anemia in chronic kidney disease: Secondary | ICD-10-CM | POA: Diagnosis not present

## 2018-08-31 DIAGNOSIS — R739 Hyperglycemia, unspecified: Secondary | ICD-10-CM | POA: Diagnosis not present

## 2018-08-31 DIAGNOSIS — N2581 Secondary hyperparathyroidism of renal origin: Secondary | ICD-10-CM | POA: Diagnosis not present

## 2018-08-31 DIAGNOSIS — N186 End stage renal disease: Secondary | ICD-10-CM | POA: Diagnosis not present

## 2018-08-31 DIAGNOSIS — D509 Iron deficiency anemia, unspecified: Secondary | ICD-10-CM | POA: Diagnosis not present

## 2018-08-31 DIAGNOSIS — D688 Other specified coagulation defects: Secondary | ICD-10-CM | POA: Diagnosis not present

## 2018-08-31 DIAGNOSIS — E1129 Type 2 diabetes mellitus with other diabetic kidney complication: Secondary | ICD-10-CM | POA: Diagnosis not present

## 2018-08-31 DIAGNOSIS — D631 Anemia in chronic kidney disease: Secondary | ICD-10-CM | POA: Diagnosis not present

## 2018-09-02 DIAGNOSIS — D631 Anemia in chronic kidney disease: Secondary | ICD-10-CM | POA: Diagnosis not present

## 2018-09-02 DIAGNOSIS — R739 Hyperglycemia, unspecified: Secondary | ICD-10-CM | POA: Diagnosis not present

## 2018-09-02 DIAGNOSIS — N186 End stage renal disease: Secondary | ICD-10-CM | POA: Diagnosis not present

## 2018-09-02 DIAGNOSIS — D509 Iron deficiency anemia, unspecified: Secondary | ICD-10-CM | POA: Diagnosis not present

## 2018-09-02 DIAGNOSIS — N2581 Secondary hyperparathyroidism of renal origin: Secondary | ICD-10-CM | POA: Diagnosis not present

## 2018-09-02 DIAGNOSIS — D688 Other specified coagulation defects: Secondary | ICD-10-CM | POA: Diagnosis not present

## 2018-09-02 DIAGNOSIS — E1129 Type 2 diabetes mellitus with other diabetic kidney complication: Secondary | ICD-10-CM | POA: Diagnosis not present

## 2018-09-05 DIAGNOSIS — D509 Iron deficiency anemia, unspecified: Secondary | ICD-10-CM | POA: Diagnosis not present

## 2018-09-05 DIAGNOSIS — N2581 Secondary hyperparathyroidism of renal origin: Secondary | ICD-10-CM | POA: Diagnosis not present

## 2018-09-05 DIAGNOSIS — N186 End stage renal disease: Secondary | ICD-10-CM | POA: Diagnosis not present

## 2018-09-05 DIAGNOSIS — D688 Other specified coagulation defects: Secondary | ICD-10-CM | POA: Diagnosis not present

## 2018-09-05 DIAGNOSIS — R739 Hyperglycemia, unspecified: Secondary | ICD-10-CM | POA: Diagnosis not present

## 2018-09-05 DIAGNOSIS — D631 Anemia in chronic kidney disease: Secondary | ICD-10-CM | POA: Diagnosis not present

## 2018-09-05 DIAGNOSIS — E1129 Type 2 diabetes mellitus with other diabetic kidney complication: Secondary | ICD-10-CM | POA: Diagnosis not present

## 2018-09-07 DIAGNOSIS — D688 Other specified coagulation defects: Secondary | ICD-10-CM | POA: Diagnosis not present

## 2018-09-07 DIAGNOSIS — E1129 Type 2 diabetes mellitus with other diabetic kidney complication: Secondary | ICD-10-CM | POA: Diagnosis not present

## 2018-09-07 DIAGNOSIS — D509 Iron deficiency anemia, unspecified: Secondary | ICD-10-CM | POA: Diagnosis not present

## 2018-09-07 DIAGNOSIS — D631 Anemia in chronic kidney disease: Secondary | ICD-10-CM | POA: Diagnosis not present

## 2018-09-07 DIAGNOSIS — N2581 Secondary hyperparathyroidism of renal origin: Secondary | ICD-10-CM | POA: Diagnosis not present

## 2018-09-07 DIAGNOSIS — N186 End stage renal disease: Secondary | ICD-10-CM | POA: Diagnosis not present

## 2018-09-07 DIAGNOSIS — R739 Hyperglycemia, unspecified: Secondary | ICD-10-CM | POA: Diagnosis not present

## 2018-09-09 DIAGNOSIS — D631 Anemia in chronic kidney disease: Secondary | ICD-10-CM | POA: Diagnosis not present

## 2018-09-09 DIAGNOSIS — D688 Other specified coagulation defects: Secondary | ICD-10-CM | POA: Diagnosis not present

## 2018-09-09 DIAGNOSIS — N186 End stage renal disease: Secondary | ICD-10-CM | POA: Diagnosis not present

## 2018-09-09 DIAGNOSIS — N2581 Secondary hyperparathyroidism of renal origin: Secondary | ICD-10-CM | POA: Diagnosis not present

## 2018-09-09 DIAGNOSIS — E1129 Type 2 diabetes mellitus with other diabetic kidney complication: Secondary | ICD-10-CM | POA: Diagnosis not present

## 2018-09-09 DIAGNOSIS — D509 Iron deficiency anemia, unspecified: Secondary | ICD-10-CM | POA: Diagnosis not present

## 2018-09-09 DIAGNOSIS — R739 Hyperglycemia, unspecified: Secondary | ICD-10-CM | POA: Diagnosis not present

## 2018-09-12 DIAGNOSIS — N186 End stage renal disease: Secondary | ICD-10-CM | POA: Diagnosis not present

## 2018-09-12 DIAGNOSIS — D688 Other specified coagulation defects: Secondary | ICD-10-CM | POA: Diagnosis not present

## 2018-09-12 DIAGNOSIS — E1129 Type 2 diabetes mellitus with other diabetic kidney complication: Secondary | ICD-10-CM | POA: Diagnosis not present

## 2018-09-12 DIAGNOSIS — R739 Hyperglycemia, unspecified: Secondary | ICD-10-CM | POA: Diagnosis not present

## 2018-09-12 DIAGNOSIS — N2581 Secondary hyperparathyroidism of renal origin: Secondary | ICD-10-CM | POA: Diagnosis not present

## 2018-09-12 DIAGNOSIS — D509 Iron deficiency anemia, unspecified: Secondary | ICD-10-CM | POA: Diagnosis not present

## 2018-09-12 DIAGNOSIS — D631 Anemia in chronic kidney disease: Secondary | ICD-10-CM | POA: Diagnosis not present

## 2018-09-14 DIAGNOSIS — D509 Iron deficiency anemia, unspecified: Secondary | ICD-10-CM | POA: Diagnosis not present

## 2018-09-14 DIAGNOSIS — N2581 Secondary hyperparathyroidism of renal origin: Secondary | ICD-10-CM | POA: Diagnosis not present

## 2018-09-14 DIAGNOSIS — E1129 Type 2 diabetes mellitus with other diabetic kidney complication: Secondary | ICD-10-CM | POA: Diagnosis not present

## 2018-09-14 DIAGNOSIS — D688 Other specified coagulation defects: Secondary | ICD-10-CM | POA: Diagnosis not present

## 2018-09-14 DIAGNOSIS — R739 Hyperglycemia, unspecified: Secondary | ICD-10-CM | POA: Diagnosis not present

## 2018-09-14 DIAGNOSIS — D631 Anemia in chronic kidney disease: Secondary | ICD-10-CM | POA: Diagnosis not present

## 2018-09-14 DIAGNOSIS — N186 End stage renal disease: Secondary | ICD-10-CM | POA: Diagnosis not present

## 2018-09-16 DIAGNOSIS — R739 Hyperglycemia, unspecified: Secondary | ICD-10-CM | POA: Diagnosis not present

## 2018-09-16 DIAGNOSIS — N2581 Secondary hyperparathyroidism of renal origin: Secondary | ICD-10-CM | POA: Diagnosis not present

## 2018-09-16 DIAGNOSIS — D509 Iron deficiency anemia, unspecified: Secondary | ICD-10-CM | POA: Diagnosis not present

## 2018-09-16 DIAGNOSIS — N186 End stage renal disease: Secondary | ICD-10-CM | POA: Diagnosis not present

## 2018-09-16 DIAGNOSIS — E1129 Type 2 diabetes mellitus with other diabetic kidney complication: Secondary | ICD-10-CM | POA: Diagnosis not present

## 2018-09-16 DIAGNOSIS — D631 Anemia in chronic kidney disease: Secondary | ICD-10-CM | POA: Diagnosis not present

## 2018-09-16 DIAGNOSIS — D688 Other specified coagulation defects: Secondary | ICD-10-CM | POA: Diagnosis not present

## 2018-09-17 DIAGNOSIS — N186 End stage renal disease: Secondary | ICD-10-CM | POA: Diagnosis not present

## 2018-09-17 DIAGNOSIS — E1129 Type 2 diabetes mellitus with other diabetic kidney complication: Secondary | ICD-10-CM | POA: Diagnosis not present

## 2018-09-17 DIAGNOSIS — Z992 Dependence on renal dialysis: Secondary | ICD-10-CM | POA: Diagnosis not present

## 2018-09-19 DIAGNOSIS — D509 Iron deficiency anemia, unspecified: Secondary | ICD-10-CM | POA: Diagnosis not present

## 2018-09-19 DIAGNOSIS — N186 End stage renal disease: Secondary | ICD-10-CM | POA: Diagnosis not present

## 2018-09-19 DIAGNOSIS — D688 Other specified coagulation defects: Secondary | ICD-10-CM | POA: Diagnosis not present

## 2018-09-19 DIAGNOSIS — R739 Hyperglycemia, unspecified: Secondary | ICD-10-CM | POA: Diagnosis not present

## 2018-09-19 DIAGNOSIS — E1129 Type 2 diabetes mellitus with other diabetic kidney complication: Secondary | ICD-10-CM | POA: Diagnosis not present

## 2018-09-19 DIAGNOSIS — N2581 Secondary hyperparathyroidism of renal origin: Secondary | ICD-10-CM | POA: Diagnosis not present

## 2018-09-21 DIAGNOSIS — D688 Other specified coagulation defects: Secondary | ICD-10-CM | POA: Diagnosis not present

## 2018-09-21 DIAGNOSIS — N2581 Secondary hyperparathyroidism of renal origin: Secondary | ICD-10-CM | POA: Diagnosis not present

## 2018-09-21 DIAGNOSIS — R739 Hyperglycemia, unspecified: Secondary | ICD-10-CM | POA: Diagnosis not present

## 2018-09-21 DIAGNOSIS — N186 End stage renal disease: Secondary | ICD-10-CM | POA: Diagnosis not present

## 2018-09-21 DIAGNOSIS — D509 Iron deficiency anemia, unspecified: Secondary | ICD-10-CM | POA: Diagnosis not present

## 2018-09-21 DIAGNOSIS — E1129 Type 2 diabetes mellitus with other diabetic kidney complication: Secondary | ICD-10-CM | POA: Diagnosis not present

## 2018-09-23 DIAGNOSIS — D509 Iron deficiency anemia, unspecified: Secondary | ICD-10-CM | POA: Diagnosis not present

## 2018-09-23 DIAGNOSIS — D688 Other specified coagulation defects: Secondary | ICD-10-CM | POA: Diagnosis not present

## 2018-09-23 DIAGNOSIS — E1129 Type 2 diabetes mellitus with other diabetic kidney complication: Secondary | ICD-10-CM | POA: Diagnosis not present

## 2018-09-23 DIAGNOSIS — N2581 Secondary hyperparathyroidism of renal origin: Secondary | ICD-10-CM | POA: Diagnosis not present

## 2018-09-23 DIAGNOSIS — N186 End stage renal disease: Secondary | ICD-10-CM | POA: Diagnosis not present

## 2018-09-23 DIAGNOSIS — R739 Hyperglycemia, unspecified: Secondary | ICD-10-CM | POA: Diagnosis not present

## 2018-09-25 DIAGNOSIS — D688 Other specified coagulation defects: Secondary | ICD-10-CM | POA: Diagnosis not present

## 2018-09-25 DIAGNOSIS — N2581 Secondary hyperparathyroidism of renal origin: Secondary | ICD-10-CM | POA: Diagnosis not present

## 2018-09-25 DIAGNOSIS — R739 Hyperglycemia, unspecified: Secondary | ICD-10-CM | POA: Diagnosis not present

## 2018-09-25 DIAGNOSIS — N186 End stage renal disease: Secondary | ICD-10-CM | POA: Diagnosis not present

## 2018-09-25 DIAGNOSIS — E1129 Type 2 diabetes mellitus with other diabetic kidney complication: Secondary | ICD-10-CM | POA: Diagnosis not present

## 2018-09-25 DIAGNOSIS — D509 Iron deficiency anemia, unspecified: Secondary | ICD-10-CM | POA: Diagnosis not present

## 2018-09-28 DIAGNOSIS — D688 Other specified coagulation defects: Secondary | ICD-10-CM | POA: Diagnosis not present

## 2018-09-28 DIAGNOSIS — R739 Hyperglycemia, unspecified: Secondary | ICD-10-CM | POA: Diagnosis not present

## 2018-09-28 DIAGNOSIS — N2581 Secondary hyperparathyroidism of renal origin: Secondary | ICD-10-CM | POA: Diagnosis not present

## 2018-09-28 DIAGNOSIS — D509 Iron deficiency anemia, unspecified: Secondary | ICD-10-CM | POA: Diagnosis not present

## 2018-09-28 DIAGNOSIS — N186 End stage renal disease: Secondary | ICD-10-CM | POA: Diagnosis not present

## 2018-09-28 DIAGNOSIS — E1129 Type 2 diabetes mellitus with other diabetic kidney complication: Secondary | ICD-10-CM | POA: Diagnosis not present

## 2018-09-30 DIAGNOSIS — N2581 Secondary hyperparathyroidism of renal origin: Secondary | ICD-10-CM | POA: Diagnosis not present

## 2018-09-30 DIAGNOSIS — R739 Hyperglycemia, unspecified: Secondary | ICD-10-CM | POA: Diagnosis not present

## 2018-09-30 DIAGNOSIS — N186 End stage renal disease: Secondary | ICD-10-CM | POA: Diagnosis not present

## 2018-09-30 DIAGNOSIS — D509 Iron deficiency anemia, unspecified: Secondary | ICD-10-CM | POA: Diagnosis not present

## 2018-09-30 DIAGNOSIS — E1129 Type 2 diabetes mellitus with other diabetic kidney complication: Secondary | ICD-10-CM | POA: Diagnosis not present

## 2018-09-30 DIAGNOSIS — D688 Other specified coagulation defects: Secondary | ICD-10-CM | POA: Diagnosis not present

## 2018-10-03 DIAGNOSIS — R739 Hyperglycemia, unspecified: Secondary | ICD-10-CM | POA: Diagnosis not present

## 2018-10-03 DIAGNOSIS — E1129 Type 2 diabetes mellitus with other diabetic kidney complication: Secondary | ICD-10-CM | POA: Diagnosis not present

## 2018-10-03 DIAGNOSIS — D509 Iron deficiency anemia, unspecified: Secondary | ICD-10-CM | POA: Diagnosis not present

## 2018-10-03 DIAGNOSIS — N186 End stage renal disease: Secondary | ICD-10-CM | POA: Diagnosis not present

## 2018-10-03 DIAGNOSIS — D688 Other specified coagulation defects: Secondary | ICD-10-CM | POA: Diagnosis not present

## 2018-10-03 DIAGNOSIS — N2581 Secondary hyperparathyroidism of renal origin: Secondary | ICD-10-CM | POA: Diagnosis not present

## 2018-10-04 DIAGNOSIS — I871 Compression of vein: Secondary | ICD-10-CM | POA: Diagnosis not present

## 2018-10-04 DIAGNOSIS — N186 End stage renal disease: Secondary | ICD-10-CM | POA: Diagnosis not present

## 2018-10-04 DIAGNOSIS — T82858A Stenosis of vascular prosthetic devices, implants and grafts, initial encounter: Secondary | ICD-10-CM | POA: Diagnosis not present

## 2018-10-04 DIAGNOSIS — Z992 Dependence on renal dialysis: Secondary | ICD-10-CM | POA: Diagnosis not present

## 2018-10-05 DIAGNOSIS — D509 Iron deficiency anemia, unspecified: Secondary | ICD-10-CM | POA: Diagnosis not present

## 2018-10-05 DIAGNOSIS — N2581 Secondary hyperparathyroidism of renal origin: Secondary | ICD-10-CM | POA: Diagnosis not present

## 2018-10-05 DIAGNOSIS — N186 End stage renal disease: Secondary | ICD-10-CM | POA: Diagnosis not present

## 2018-10-05 DIAGNOSIS — R739 Hyperglycemia, unspecified: Secondary | ICD-10-CM | POA: Diagnosis not present

## 2018-10-05 DIAGNOSIS — D688 Other specified coagulation defects: Secondary | ICD-10-CM | POA: Diagnosis not present

## 2018-10-05 DIAGNOSIS — E1129 Type 2 diabetes mellitus with other diabetic kidney complication: Secondary | ICD-10-CM | POA: Diagnosis not present

## 2018-10-07 DIAGNOSIS — N186 End stage renal disease: Secondary | ICD-10-CM | POA: Diagnosis not present

## 2018-10-07 DIAGNOSIS — E1129 Type 2 diabetes mellitus with other diabetic kidney complication: Secondary | ICD-10-CM | POA: Diagnosis not present

## 2018-10-07 DIAGNOSIS — N2581 Secondary hyperparathyroidism of renal origin: Secondary | ICD-10-CM | POA: Diagnosis not present

## 2018-10-07 DIAGNOSIS — R739 Hyperglycemia, unspecified: Secondary | ICD-10-CM | POA: Diagnosis not present

## 2018-10-07 DIAGNOSIS — D688 Other specified coagulation defects: Secondary | ICD-10-CM | POA: Diagnosis not present

## 2018-10-07 DIAGNOSIS — D509 Iron deficiency anemia, unspecified: Secondary | ICD-10-CM | POA: Diagnosis not present

## 2018-10-10 DIAGNOSIS — N186 End stage renal disease: Secondary | ICD-10-CM | POA: Diagnosis not present

## 2018-10-10 DIAGNOSIS — R739 Hyperglycemia, unspecified: Secondary | ICD-10-CM | POA: Diagnosis not present

## 2018-10-10 DIAGNOSIS — D688 Other specified coagulation defects: Secondary | ICD-10-CM | POA: Diagnosis not present

## 2018-10-10 DIAGNOSIS — D509 Iron deficiency anemia, unspecified: Secondary | ICD-10-CM | POA: Diagnosis not present

## 2018-10-10 DIAGNOSIS — E1129 Type 2 diabetes mellitus with other diabetic kidney complication: Secondary | ICD-10-CM | POA: Diagnosis not present

## 2018-10-10 DIAGNOSIS — N2581 Secondary hyperparathyroidism of renal origin: Secondary | ICD-10-CM | POA: Diagnosis not present

## 2018-10-12 DIAGNOSIS — N186 End stage renal disease: Secondary | ICD-10-CM | POA: Diagnosis not present

## 2018-10-12 DIAGNOSIS — D509 Iron deficiency anemia, unspecified: Secondary | ICD-10-CM | POA: Diagnosis not present

## 2018-10-12 DIAGNOSIS — D688 Other specified coagulation defects: Secondary | ICD-10-CM | POA: Diagnosis not present

## 2018-10-12 DIAGNOSIS — E1129 Type 2 diabetes mellitus with other diabetic kidney complication: Secondary | ICD-10-CM | POA: Diagnosis not present

## 2018-10-12 DIAGNOSIS — R739 Hyperglycemia, unspecified: Secondary | ICD-10-CM | POA: Diagnosis not present

## 2018-10-12 DIAGNOSIS — N2581 Secondary hyperparathyroidism of renal origin: Secondary | ICD-10-CM | POA: Diagnosis not present

## 2018-10-14 DIAGNOSIS — R739 Hyperglycemia, unspecified: Secondary | ICD-10-CM | POA: Diagnosis not present

## 2018-10-14 DIAGNOSIS — D688 Other specified coagulation defects: Secondary | ICD-10-CM | POA: Diagnosis not present

## 2018-10-14 DIAGNOSIS — E1129 Type 2 diabetes mellitus with other diabetic kidney complication: Secondary | ICD-10-CM | POA: Diagnosis not present

## 2018-10-14 DIAGNOSIS — D509 Iron deficiency anemia, unspecified: Secondary | ICD-10-CM | POA: Diagnosis not present

## 2018-10-14 DIAGNOSIS — N186 End stage renal disease: Secondary | ICD-10-CM | POA: Diagnosis not present

## 2018-10-14 DIAGNOSIS — N2581 Secondary hyperparathyroidism of renal origin: Secondary | ICD-10-CM | POA: Diagnosis not present

## 2018-10-16 ENCOUNTER — Other Ambulatory Visit: Payer: Self-pay | Admitting: Family Medicine

## 2018-10-16 DIAGNOSIS — G608 Other hereditary and idiopathic neuropathies: Secondary | ICD-10-CM

## 2018-10-17 DIAGNOSIS — Z992 Dependence on renal dialysis: Secondary | ICD-10-CM | POA: Diagnosis not present

## 2018-10-17 DIAGNOSIS — R739 Hyperglycemia, unspecified: Secondary | ICD-10-CM | POA: Diagnosis not present

## 2018-10-17 DIAGNOSIS — N2581 Secondary hyperparathyroidism of renal origin: Secondary | ICD-10-CM | POA: Diagnosis not present

## 2018-10-17 DIAGNOSIS — E1129 Type 2 diabetes mellitus with other diabetic kidney complication: Secondary | ICD-10-CM | POA: Diagnosis not present

## 2018-10-17 DIAGNOSIS — D688 Other specified coagulation defects: Secondary | ICD-10-CM | POA: Diagnosis not present

## 2018-10-17 DIAGNOSIS — D509 Iron deficiency anemia, unspecified: Secondary | ICD-10-CM | POA: Diagnosis not present

## 2018-10-17 DIAGNOSIS — N186 End stage renal disease: Secondary | ICD-10-CM | POA: Diagnosis not present

## 2018-10-19 DIAGNOSIS — D688 Other specified coagulation defects: Secondary | ICD-10-CM | POA: Diagnosis not present

## 2018-10-19 DIAGNOSIS — E1129 Type 2 diabetes mellitus with other diabetic kidney complication: Secondary | ICD-10-CM | POA: Diagnosis not present

## 2018-10-19 DIAGNOSIS — N186 End stage renal disease: Secondary | ICD-10-CM | POA: Diagnosis not present

## 2018-10-19 DIAGNOSIS — N2581 Secondary hyperparathyroidism of renal origin: Secondary | ICD-10-CM | POA: Diagnosis not present

## 2018-10-19 DIAGNOSIS — D631 Anemia in chronic kidney disease: Secondary | ICD-10-CM | POA: Diagnosis not present

## 2018-10-19 DIAGNOSIS — R739 Hyperglycemia, unspecified: Secondary | ICD-10-CM | POA: Diagnosis not present

## 2018-10-19 DIAGNOSIS — D509 Iron deficiency anemia, unspecified: Secondary | ICD-10-CM | POA: Diagnosis not present

## 2018-10-21 DIAGNOSIS — R739 Hyperglycemia, unspecified: Secondary | ICD-10-CM | POA: Diagnosis not present

## 2018-10-21 DIAGNOSIS — D631 Anemia in chronic kidney disease: Secondary | ICD-10-CM | POA: Diagnosis not present

## 2018-10-21 DIAGNOSIS — D688 Other specified coagulation defects: Secondary | ICD-10-CM | POA: Diagnosis not present

## 2018-10-21 DIAGNOSIS — N186 End stage renal disease: Secondary | ICD-10-CM | POA: Diagnosis not present

## 2018-10-21 DIAGNOSIS — D509 Iron deficiency anemia, unspecified: Secondary | ICD-10-CM | POA: Diagnosis not present

## 2018-10-21 DIAGNOSIS — N2581 Secondary hyperparathyroidism of renal origin: Secondary | ICD-10-CM | POA: Diagnosis not present

## 2018-10-21 DIAGNOSIS — E1129 Type 2 diabetes mellitus with other diabetic kidney complication: Secondary | ICD-10-CM | POA: Diagnosis not present

## 2018-10-24 DIAGNOSIS — N2581 Secondary hyperparathyroidism of renal origin: Secondary | ICD-10-CM | POA: Diagnosis not present

## 2018-10-24 DIAGNOSIS — R739 Hyperglycemia, unspecified: Secondary | ICD-10-CM | POA: Diagnosis not present

## 2018-10-24 DIAGNOSIS — E1129 Type 2 diabetes mellitus with other diabetic kidney complication: Secondary | ICD-10-CM | POA: Diagnosis not present

## 2018-10-24 DIAGNOSIS — D631 Anemia in chronic kidney disease: Secondary | ICD-10-CM | POA: Diagnosis not present

## 2018-10-24 DIAGNOSIS — N186 End stage renal disease: Secondary | ICD-10-CM | POA: Diagnosis not present

## 2018-10-24 DIAGNOSIS — D688 Other specified coagulation defects: Secondary | ICD-10-CM | POA: Diagnosis not present

## 2018-10-24 DIAGNOSIS — D509 Iron deficiency anemia, unspecified: Secondary | ICD-10-CM | POA: Diagnosis not present

## 2018-10-26 DIAGNOSIS — D688 Other specified coagulation defects: Secondary | ICD-10-CM | POA: Diagnosis not present

## 2018-10-26 DIAGNOSIS — R739 Hyperglycemia, unspecified: Secondary | ICD-10-CM | POA: Diagnosis not present

## 2018-10-26 DIAGNOSIS — N186 End stage renal disease: Secondary | ICD-10-CM | POA: Diagnosis not present

## 2018-10-26 DIAGNOSIS — E1129 Type 2 diabetes mellitus with other diabetic kidney complication: Secondary | ICD-10-CM | POA: Diagnosis not present

## 2018-10-26 DIAGNOSIS — N2581 Secondary hyperparathyroidism of renal origin: Secondary | ICD-10-CM | POA: Diagnosis not present

## 2018-10-26 DIAGNOSIS — D509 Iron deficiency anemia, unspecified: Secondary | ICD-10-CM | POA: Diagnosis not present

## 2018-10-26 DIAGNOSIS — D631 Anemia in chronic kidney disease: Secondary | ICD-10-CM | POA: Diagnosis not present

## 2018-10-27 ENCOUNTER — Other Ambulatory Visit: Payer: Self-pay

## 2018-10-27 ENCOUNTER — Encounter: Payer: Self-pay | Admitting: Podiatry

## 2018-10-27 ENCOUNTER — Ambulatory Visit: Payer: Medicare Other | Admitting: Podiatry

## 2018-10-27 VITALS — Temp 96.7°F

## 2018-10-27 DIAGNOSIS — B351 Tinea unguium: Secondary | ICD-10-CM | POA: Diagnosis not present

## 2018-10-27 DIAGNOSIS — M79609 Pain in unspecified limb: Secondary | ICD-10-CM | POA: Diagnosis not present

## 2018-10-27 DIAGNOSIS — E0843 Diabetes mellitus due to underlying condition with diabetic autonomic (poly)neuropathy: Secondary | ICD-10-CM

## 2018-10-27 NOTE — Progress Notes (Signed)
Patient ID: Rachel Hobbs, female   DOB: 1940-11-10, 78 y.o.   MRN: [id number] HPI  Complaint:  Visit Type: Patient returns to my office for continued preventative foot care services. Complaint: Patient states" my nails have grown long and thick and become painful to walk and wear shoes" Patient has been diagnosed with DM . This patient  presents for preventative foot care services. No changes to ROS  Podiatric Exam: Vascular: dorsalis pedis and posterior tibial pulses are non palpable due to swelling both feet.. Capillary return is slow to refill. Temperature gradient is negative. Skin turgor WNL, bilateral swelling  Sensorium: Diminished  Semmes Weinstein monofilament test. Normal tactile sensation bilaterally.  Nail Exam: Pt has thick disfigured discolored nails with subungual debris noted bilateral entire nail hallux through fifth toenails.  Great hallux nail left foot is severely painful.  Second toenail left is unattached from nail bed. Ulcer Exam: There is no evidence of ulcer or pre-ulcerative changes or infection. Orthopedic Exam: Muscle tone and strength are WNL. No limitations in general ROM. No crepitus or effusions noted. Foot type and digits show no abnormalities.  HAV  B/L Skin: No Porokeratosis. No infection or ulcers.  Desquamated skin distal aspect right hallux.  Diagnosis:  Onychomycosis, Pain in right toe, pain in left toes  Treatment & Plan Procedures and Treatment: Consent by patient was obtained for treatment procedures. The patient understood the discussion of treatment and procedures well. All questions were answered thoroughly reviewed. Debridement of mycotic and hypertrophic toenails, 1 through 5 bilateral and clearing of subungual debris. No ulceration, no infection noted.  Return Visit-Office Procedure: Patient instructed to return to the office for a follow up visit 10 weeks  for continued evaluation and treatment.   Gardiner Barefoot DPM

## 2018-10-28 DIAGNOSIS — D631 Anemia in chronic kidney disease: Secondary | ICD-10-CM | POA: Diagnosis not present

## 2018-10-28 DIAGNOSIS — N186 End stage renal disease: Secondary | ICD-10-CM | POA: Diagnosis not present

## 2018-10-28 DIAGNOSIS — R739 Hyperglycemia, unspecified: Secondary | ICD-10-CM | POA: Diagnosis not present

## 2018-10-28 DIAGNOSIS — N2581 Secondary hyperparathyroidism of renal origin: Secondary | ICD-10-CM | POA: Diagnosis not present

## 2018-10-28 DIAGNOSIS — D688 Other specified coagulation defects: Secondary | ICD-10-CM | POA: Diagnosis not present

## 2018-10-28 DIAGNOSIS — E1129 Type 2 diabetes mellitus with other diabetic kidney complication: Secondary | ICD-10-CM | POA: Diagnosis not present

## 2018-10-28 DIAGNOSIS — D509 Iron deficiency anemia, unspecified: Secondary | ICD-10-CM | POA: Diagnosis not present

## 2018-10-30 ENCOUNTER — Other Ambulatory Visit: Payer: Self-pay | Admitting: *Deleted

## 2018-10-30 DIAGNOSIS — E118 Type 2 diabetes mellitus with unspecified complications: Secondary | ICD-10-CM

## 2018-10-30 MED ORDER — ATORVASTATIN CALCIUM 40 MG PO TABS: 40.0000 mg | ORAL_TABLET | Freq: Every day | ORAL | 1 refills | Status: DC

## 2018-10-31 DIAGNOSIS — N2581 Secondary hyperparathyroidism of renal origin: Secondary | ICD-10-CM | POA: Diagnosis not present

## 2018-10-31 DIAGNOSIS — D688 Other specified coagulation defects: Secondary | ICD-10-CM | POA: Diagnosis not present

## 2018-10-31 DIAGNOSIS — E1129 Type 2 diabetes mellitus with other diabetic kidney complication: Secondary | ICD-10-CM | POA: Diagnosis not present

## 2018-10-31 DIAGNOSIS — R739 Hyperglycemia, unspecified: Secondary | ICD-10-CM | POA: Diagnosis not present

## 2018-10-31 DIAGNOSIS — D509 Iron deficiency anemia, unspecified: Secondary | ICD-10-CM | POA: Diagnosis not present

## 2018-10-31 DIAGNOSIS — D631 Anemia in chronic kidney disease: Secondary | ICD-10-CM | POA: Diagnosis not present

## 2018-10-31 DIAGNOSIS — N186 End stage renal disease: Secondary | ICD-10-CM | POA: Diagnosis not present

## 2018-11-02 DIAGNOSIS — E1129 Type 2 diabetes mellitus with other diabetic kidney complication: Secondary | ICD-10-CM | POA: Diagnosis not present

## 2018-11-02 DIAGNOSIS — N2581 Secondary hyperparathyroidism of renal origin: Secondary | ICD-10-CM | POA: Diagnosis not present

## 2018-11-02 DIAGNOSIS — D631 Anemia in chronic kidney disease: Secondary | ICD-10-CM | POA: Diagnosis not present

## 2018-11-02 DIAGNOSIS — N186 End stage renal disease: Secondary | ICD-10-CM | POA: Diagnosis not present

## 2018-11-02 DIAGNOSIS — D688 Other specified coagulation defects: Secondary | ICD-10-CM | POA: Diagnosis not present

## 2018-11-02 DIAGNOSIS — D509 Iron deficiency anemia, unspecified: Secondary | ICD-10-CM | POA: Diagnosis not present

## 2018-11-02 DIAGNOSIS — R739 Hyperglycemia, unspecified: Secondary | ICD-10-CM | POA: Diagnosis not present

## 2018-11-03 ENCOUNTER — Other Ambulatory Visit: Payer: Self-pay

## 2018-11-03 ENCOUNTER — Ambulatory Visit (INDEPENDENT_AMBULATORY_CARE_PROVIDER_SITE_OTHER): Payer: Medicare Other | Admitting: Family Medicine

## 2018-11-03 ENCOUNTER — Encounter: Payer: Self-pay | Admitting: Family Medicine

## 2018-11-03 VITALS — BP 110/52 | HR 85 | Temp 98.6°F | Wt 225.0 lb

## 2018-11-03 DIAGNOSIS — E118 Type 2 diabetes mellitus with unspecified complications: Secondary | ICD-10-CM | POA: Diagnosis not present

## 2018-11-03 DIAGNOSIS — H6122 Impacted cerumen, left ear: Secondary | ICD-10-CM | POA: Insufficient documentation

## 2018-11-03 DIAGNOSIS — J302 Other seasonal allergic rhinitis: Secondary | ICD-10-CM

## 2018-11-03 LAB — POCT GLYCOSYLATED HEMOGLOBIN (HGB A1C): HbA1c, POC (controlled diabetic range): 9.2 % — AB (ref 0.0–7.0)

## 2018-11-03 NOTE — Assessment & Plan Note (Addendum)
Flush ears today.  Likely cause of fullness, could also consider allergic rhinitis as contributing to fullness.  No red flag symptoms or evidence of infection.

## 2018-11-03 NOTE — Assessment & Plan Note (Signed)
Suspect that patient has seasonal allergies given her report of symptoms.  Could also be contributing to ear fullness.  Patient advised that she can use Claritin over-the-counter when symptoms are at worst and can also use nasal saline daily, which can help improve fullness, if does not improve following removal of cerumen.

## 2018-11-03 NOTE — Assessment & Plan Note (Signed)
Patient on HD Tuesday, Thursday, Saturday schedule.  A1c 9.2.  Follow-up with PCP for DM.

## 2018-11-03 NOTE — Progress Notes (Signed)
     Subjective: Chief Complaint  Patient presents with  . Ear Fullness     HPI: Rachel Hobbs is a 78 y.o. presenting to clinic today to discuss the following:  1 Ear Fullness Patient reports ear fullness on left side that has been progressively worsening for the last month.  Also reports occasional rhinorrhea.  States that this usually happens with spring and summer.  Notes that she had ear fullness on the left a few months ago as well, that spontaneously resolved.  She also notes a mild decrease in her hearing on the left side.  She denies any fever, cough, sore throat.  Otherwise, patient states she has been in her normal state of health.  Health Maintenance: A1c today     ROS noted in HPI.   Past Medical, Surgical, Social, and Family History Reviewed & Updated per EMR.   Pertinent Historical Findings include:   Social History   Tobacco Use  Smoking Status Never Smoker  Smokeless Tobacco Never Used      Objective: BP (!) 110/52   Pulse 85   Temp 98.6 F (37 C) (Oral)   Wt 225 lb (102.1 kg)   SpO2 99%   BMI 41.15 kg/m  Vitals and nursing notes reviewed  Physical Exam:  General: 78 y.o. female in NAD HEENT: Left ear with impacted cerumen, right ear with minimal cerumen, tympanic membrane clear, nonerythematous, oropharynx clear, moist Neck: Supple, no cervical LAD Cardio: RRR no m/r/g Lungs: CTAB, no wheezing, no rhonchi, no crackles, no IWOB on RA   Results for orders placed or performed in visit on 11/03/18 (from the past 72 hour(s))  HgB A1c     Status: Abnormal   Collection Time: 11/03/18  3:00 PM  Result Value Ref Range   Hemoglobin A1C     HbA1c POC (<> result, manual entry)     HbA1c, POC (prediabetic range)     HbA1c, POC (controlled diabetic range) 9.2 (A) 0.0 - 7.0 %    Assessment/Plan:  Impacted cerumen of left ear Flush ears today.  Likely cause of fullness, could also consider allergic rhinitis as contributing to fullness.  No red flag  symptoms or evidence of infection.  Seasonal allergic rhinitis Suspect that patient has seasonal allergies given her report of symptoms.  Could also be contributing to ear fullness.  Patient advised that she can use Claritin over-the-counter when symptoms are at worst and can also use nasal saline daily, which can help improve fullness, if does not improve following removal of cerumen.  DM type 2, controlled, with complication [facility]) Patient on HD Tuesday, Thursday, Saturday schedule.  A1c 9.2.  Follow-up with PCP for DM.     PATIENT EDUCATION PROVIDED: See AVS    Diagnosis and plan along with any newly prescribed medication(s) were discussed in detail with this patient today. The patient verbalized understanding and agreed with the plan. Patient advised if symptoms worsen return to clinic or ER.     Orders Placed This Encounter  Procedures  . HgB A1c    No orders of the defined types were placed in this encounter.    [place] Constable, DO 11/03/2018, 4:18 PM PGY-2 [place]

## 2018-11-03 NOTE — Patient Instructions (Signed)
Thank you for coming to see me today. It was a pleasure. Today we talked about:   Your ear: we will clean out your ears today.  You can try clairtin over the counter for your runny nose.  If you have any questions or concerns, please do not hesitate to call the office at [phone].  Best,   Arizona Constable, DO   Allergic Rhinitis, Adult Allergic rhinitis is a reaction to allergens in the air. Allergens are tiny specks (particles) in the air that cause your body to have an allergic reaction. This condition cannot be passed from person to person (is not contagious). Allergic rhinitis cannot be cured, but it can be controlled. There are two types of allergic rhinitis:  Seasonal. This type is also called hay fever. It happens only during certain times of the year.  Perennial. This type can happen at any time of the year. What are the causes? This condition may be caused by:  Pollen from grasses, trees, and weeds.  House dust mites.  Pet dander.  Mold. What are the signs or symptoms? Symptoms of this condition include:  Sneezing.  Runny or stuffy nose (nasal congestion).  A lot of mucus in the back of the throat (postnasal drip).  Itchy nose.  Tearing of the eyes.  Trouble sleeping.  Being sleepy during day. How is this treated? There is no cure for this condition. You should avoid things that trigger your symptoms (allergens). Treatment can help to relieve symptoms. This may include:  Medicines that block allergy symptoms, such as antihistamines. These may be given as a shot, nasal spray, or pill.  Shots that are given until your body becomes less sensitive to the allergen (desensitization).  Stronger medicines, if all other treatments have not worked. Follow these instructions at home: Avoiding allergens   Find out what you are allergic to. Common allergens include smoke, dust, and pollen.  Avoid them if you can. These are some of the things that you  can do to avoid allergens: ? Replace carpet with wood, tile, or vinyl flooring. Carpet can trap dander and dust. ? Clean any mold found in the home. ? Do not smoke. Do not allow smoking in your home. ? Change your heating and air conditioning filter at least once a month. ? During allergy season:  Keep windows closed as much as you can. If possible, use air conditioning when there is a lot of pollen in the air.  Use a special filter for allergies with your furnace and air conditioner.  Plan outdoor activities when pollen counts are lowest. This is usually during the early morning or evening hours.  If you do go outdoors when pollen count is high, wear a special mask for people with allergies.  When you come indoors, take a shower and change your clothes before sitting on furniture or bedding. General instructions  Do not use fans in your home.  Do not hang clothes outside to dry.  Wear sunglasses to keep pollen out of your eyes.  Wash your hands right away after you touch household pets.  Take over-the-counter and prescription medicines only as told by your doctor.  Keep all follow-up visits as told by your doctor. This is important. Contact a doctor if:  You have a fever.  You have a cough that does not go away (is persistent).  You start to make whistling sounds when you breathe (wheeze).  Your symptoms do not get better with treatment.  You have  thick fluid coming from your nose.  You start to have nosebleeds. Get help right away if:  Your tongue or your lips are swollen.  You have trouble breathing.  You feel dizzy or you feel like you are going to pass out (faint).  You have cold sweats. Summary  Allergic rhinitis is a reaction to allergens in the air.  This condition may be caused by allergens. These include pollen, dust mites, pet dander, and mold.  Symptoms include a runny, itchy nose, sneezing, or tearing eyes. You may also have trouble sleeping or  feel sleepy during the day.  Treatment includes taking medicines and avoiding allergens. You may also get shots or take stronger medicines.  Get help if you have a fever or a cough that does not stop. Get help right away if you are short of breath. This information is not intended to replace advice given to you by your health care provider. Make sure you discuss any questions you have with your health care provider. Document Released: 08/05/2010 Document Revised: 07/25/2018 Document Reviewed: 10/25/2017 Elsevier Patient Education  2020 [employer].

## 2018-11-04 DIAGNOSIS — D688 Other specified coagulation defects: Secondary | ICD-10-CM | POA: Diagnosis not present

## 2018-11-04 DIAGNOSIS — N2581 Secondary hyperparathyroidism of renal origin: Secondary | ICD-10-CM | POA: Diagnosis not present

## 2018-11-04 DIAGNOSIS — E1129 Type 2 diabetes mellitus with other diabetic kidney complication: Secondary | ICD-10-CM | POA: Diagnosis not present

## 2018-11-04 DIAGNOSIS — D631 Anemia in chronic kidney disease: Secondary | ICD-10-CM | POA: Diagnosis not present

## 2018-11-04 DIAGNOSIS — D509 Iron deficiency anemia, unspecified: Secondary | ICD-10-CM | POA: Diagnosis not present

## 2018-11-04 DIAGNOSIS — R739 Hyperglycemia, unspecified: Secondary | ICD-10-CM | POA: Diagnosis not present

## 2018-11-04 DIAGNOSIS — N186 End stage renal disease: Secondary | ICD-10-CM | POA: Diagnosis not present

## 2018-11-07 DIAGNOSIS — N186 End stage renal disease: Secondary | ICD-10-CM | POA: Diagnosis not present

## 2018-11-07 DIAGNOSIS — D509 Iron deficiency anemia, unspecified: Secondary | ICD-10-CM | POA: Diagnosis not present

## 2018-11-07 DIAGNOSIS — N2581 Secondary hyperparathyroidism of renal origin: Secondary | ICD-10-CM | POA: Diagnosis not present

## 2018-11-07 DIAGNOSIS — R739 Hyperglycemia, unspecified: Secondary | ICD-10-CM | POA: Diagnosis not present

## 2018-11-07 DIAGNOSIS — D631 Anemia in chronic kidney disease: Secondary | ICD-10-CM | POA: Diagnosis not present

## 2018-11-07 DIAGNOSIS — E1129 Type 2 diabetes mellitus with other diabetic kidney complication: Secondary | ICD-10-CM | POA: Diagnosis not present

## 2018-11-07 DIAGNOSIS — D688 Other specified coagulation defects: Secondary | ICD-10-CM | POA: Diagnosis not present

## 2018-11-09 DIAGNOSIS — N186 End stage renal disease: Secondary | ICD-10-CM | POA: Diagnosis not present

## 2018-11-09 DIAGNOSIS — D688 Other specified coagulation defects: Secondary | ICD-10-CM | POA: Diagnosis not present

## 2018-11-09 DIAGNOSIS — N2581 Secondary hyperparathyroidism of renal origin: Secondary | ICD-10-CM | POA: Diagnosis not present

## 2018-11-09 DIAGNOSIS — D509 Iron deficiency anemia, unspecified: Secondary | ICD-10-CM | POA: Diagnosis not present

## 2018-11-09 DIAGNOSIS — D631 Anemia in chronic kidney disease: Secondary | ICD-10-CM | POA: Diagnosis not present

## 2018-11-09 DIAGNOSIS — E1129 Type 2 diabetes mellitus with other diabetic kidney complication: Secondary | ICD-10-CM | POA: Diagnosis not present

## 2018-11-09 DIAGNOSIS — R739 Hyperglycemia, unspecified: Secondary | ICD-10-CM | POA: Diagnosis not present

## 2018-11-11 DIAGNOSIS — D509 Iron deficiency anemia, unspecified: Secondary | ICD-10-CM | POA: Diagnosis not present

## 2018-11-11 DIAGNOSIS — D631 Anemia in chronic kidney disease: Secondary | ICD-10-CM | POA: Diagnosis not present

## 2018-11-11 DIAGNOSIS — N2581 Secondary hyperparathyroidism of renal origin: Secondary | ICD-10-CM | POA: Diagnosis not present

## 2018-11-11 DIAGNOSIS — R739 Hyperglycemia, unspecified: Secondary | ICD-10-CM | POA: Diagnosis not present

## 2018-11-11 DIAGNOSIS — E1129 Type 2 diabetes mellitus with other diabetic kidney complication: Secondary | ICD-10-CM | POA: Diagnosis not present

## 2018-11-11 DIAGNOSIS — N186 End stage renal disease: Secondary | ICD-10-CM | POA: Diagnosis not present

## 2018-11-11 DIAGNOSIS — D688 Other specified coagulation defects: Secondary | ICD-10-CM | POA: Diagnosis not present

## 2018-11-14 DIAGNOSIS — D688 Other specified coagulation defects: Secondary | ICD-10-CM | POA: Diagnosis not present

## 2018-11-14 DIAGNOSIS — N2581 Secondary hyperparathyroidism of renal origin: Secondary | ICD-10-CM | POA: Diagnosis not present

## 2018-11-14 DIAGNOSIS — R739 Hyperglycemia, unspecified: Secondary | ICD-10-CM | POA: Diagnosis not present

## 2018-11-14 DIAGNOSIS — E1129 Type 2 diabetes mellitus with other diabetic kidney complication: Secondary | ICD-10-CM | POA: Diagnosis not present

## 2018-11-14 DIAGNOSIS — N186 End stage renal disease: Secondary | ICD-10-CM | POA: Diagnosis not present

## 2018-11-14 DIAGNOSIS — D509 Iron deficiency anemia, unspecified: Secondary | ICD-10-CM | POA: Diagnosis not present

## 2018-11-14 DIAGNOSIS — D631 Anemia in chronic kidney disease: Secondary | ICD-10-CM | POA: Diagnosis not present

## 2018-11-16 DIAGNOSIS — R739 Hyperglycemia, unspecified: Secondary | ICD-10-CM | POA: Diagnosis not present

## 2018-11-16 DIAGNOSIS — D631 Anemia in chronic kidney disease: Secondary | ICD-10-CM | POA: Diagnosis not present

## 2018-11-16 DIAGNOSIS — E1129 Type 2 diabetes mellitus with other diabetic kidney complication: Secondary | ICD-10-CM | POA: Diagnosis not present

## 2018-11-16 DIAGNOSIS — N2581 Secondary hyperparathyroidism of renal origin: Secondary | ICD-10-CM | POA: Diagnosis not present

## 2018-11-16 DIAGNOSIS — N186 End stage renal disease: Secondary | ICD-10-CM | POA: Diagnosis not present

## 2018-11-16 DIAGNOSIS — D688 Other specified coagulation defects: Secondary | ICD-10-CM | POA: Diagnosis not present

## 2018-11-16 DIAGNOSIS — D509 Iron deficiency anemia, unspecified: Secondary | ICD-10-CM | POA: Diagnosis not present

## 2018-11-17 DIAGNOSIS — N186 End stage renal disease: Secondary | ICD-10-CM | POA: Diagnosis not present

## 2018-11-17 DIAGNOSIS — E1129 Type 2 diabetes mellitus with other diabetic kidney complication: Secondary | ICD-10-CM | POA: Diagnosis not present

## 2018-11-17 DIAGNOSIS — Z992 Dependence on renal dialysis: Secondary | ICD-10-CM | POA: Diagnosis not present

## 2018-11-29 LAB — HM DIABETES EYE EXAM

## 2018-12-11 LAB — HM DIABETES EYE EXAM

## 2018-12-12 ENCOUNTER — Other Ambulatory Visit: Payer: Self-pay

## 2018-12-12 DIAGNOSIS — G608 Other hereditary and idiopathic neuropathies: Secondary | ICD-10-CM

## 2018-12-12 MED ORDER — GABAPENTIN 100 MG PO CAPS: ORAL_CAPSULE | ORAL | 0 refills | Status: DC

## 2018-12-26 ENCOUNTER — Encounter: Payer: Self-pay | Admitting: Family Medicine

## 2019-01-03 ENCOUNTER — Other Ambulatory Visit: Payer: Self-pay | Admitting: Family Medicine

## 2019-01-03 DIAGNOSIS — E118 Type 2 diabetes mellitus with unspecified complications: Secondary | ICD-10-CM

## 2019-01-10 ENCOUNTER — Ambulatory Visit: Payer: Medicare Other | Admitting: Podiatry

## 2019-01-22 ENCOUNTER — Other Ambulatory Visit: Payer: Self-pay

## 2019-01-22 DIAGNOSIS — G608 Other hereditary and idiopathic neuropathies: Secondary | ICD-10-CM

## 2019-01-23 MED ORDER — GABAPENTIN 100 MG PO CAPS: ORAL_CAPSULE | ORAL | 0 refills | Status: DC

## 2019-02-05 DIAGNOSIS — T7840XA Allergy, unspecified, initial encounter: Secondary | ICD-10-CM | POA: Insufficient documentation

## 2019-02-14 ENCOUNTER — Encounter: Payer: Self-pay | Admitting: Podiatry

## 2019-02-14 ENCOUNTER — Ambulatory Visit: Payer: Medicare Other | Admitting: Podiatry

## 2019-02-14 ENCOUNTER — Other Ambulatory Visit: Payer: Self-pay

## 2019-02-14 ENCOUNTER — Encounter: Payer: Self-pay | Admitting: Family Medicine

## 2019-02-14 DIAGNOSIS — B351 Tinea unguium: Secondary | ICD-10-CM | POA: Diagnosis not present

## 2019-02-14 DIAGNOSIS — E1151 Type 2 diabetes mellitus with diabetic peripheral angiopathy without gangrene: Secondary | ICD-10-CM | POA: Diagnosis not present

## 2019-02-14 DIAGNOSIS — Z794 Long term (current) use of insulin: Secondary | ICD-10-CM | POA: Diagnosis not present

## 2019-02-14 DIAGNOSIS — M79609 Pain in unspecified limb: Secondary | ICD-10-CM | POA: Diagnosis not present

## 2019-02-14 NOTE — Progress Notes (Signed)
Patient ID: Rachel Hobbs, female   DOB: [birth year], 78 y.o.   MRN: [id number] HPI  Complaint:  Visit Type: Patient returns to my office for continued preventative foot care services. Complaint: Patient states" my nails have grown long and thick and become painful to walk and wear shoes" Patient has been diagnosed with DM . This patient  presents for preventative foot care services.  She says she is having severe pain in her big toe left foot.   No changes to ROS  Podiatric Exam: Vascular: dorsalis pedis and posterior tibial pulses are non palpable .Swelling  B/L. Capillary return is slow to refill. Temperature gradient is negative. Skin turgor WNL, bilateral swelling  Sensorium: Diminished  Semmes Weinstein monofilament test. Normal tactile sensation bilaterally.  Nail Exam: Pt has thick disfigured discolored nails with subungual debris noted bilateral entire nail hallux through fifth toenails.  Great hallux nail left foot is severely painful.   Ulcer Exam: There is no evidence of ulcer or pre-ulcerative changes or infection. Orthopedic Exam: Muscle tone and strength are WNL. No limitations in general ROM. No crepitus or effusions noted. Foot type and digits show no abnormalities.  HAV  B/L Skin: No Porokeratosis. No infection or ulcers.  Desquamated skin distal aspect right hallux.  Diagnosis:  Onychomycosis, Pain in right toe, pain in left toes  Treatment & Plan Procedures and Treatment: Consent by patient was obtained for treatment procedures. The patient understood the discussion of treatment and procedures well. All questions were answered thoroughly reviewed. Debridement of mycotic and hypertrophic toenails, 1 through 5 bilateral and clearing of subungual debris. No ulceration, no infection noted. patient needs vascular exam.  Contact  Valerie for ordering  vascular studies and exam. Return Visit-Office Procedure: Patient instructed to return to the office for a follow up visit 10 weeks   for continued evaluation and treatment.   Gardiner Barefoot DPM

## 2019-02-15 ENCOUNTER — Telehealth: Payer: Self-pay | Admitting: *Deleted

## 2019-02-15 DIAGNOSIS — L819 Disorder of pigmentation, unspecified: Secondary | ICD-10-CM

## 2019-02-15 DIAGNOSIS — R0989 Other specified symptoms and signs involving the circulatory and respiratory systems: Secondary | ICD-10-CM

## 2019-02-15 NOTE — Telephone Encounter (Signed)
Dr. Prudence Davidson requested pt be referred to [facility] - Dr. Donnetta Hutching for non-palpable pulses and darkened toes. Faxed required referral form, clinicals and demographics to [facility].

## 2019-03-05 ENCOUNTER — Telehealth: Payer: Self-pay | Admitting: *Deleted

## 2019-03-05 NOTE — Telephone Encounter (Signed)
LM for patient to call back and schedule an appointment to follow up on her diabetes as well as get her flu shot.  Jazmin Hartsell,CMA

## 2019-03-06 ENCOUNTER — Other Ambulatory Visit: Payer: Self-pay | Admitting: Family Medicine

## 2019-03-06 DIAGNOSIS — E118 Type 2 diabetes mellitus with unspecified complications: Secondary | ICD-10-CM

## 2019-03-08 NOTE — Telephone Encounter (Signed)
Left message informing pt of the 03/01/2019 call from [facility] and [facility] [phone] to call to schedule.

## 2019-03-08 NOTE — Telephone Encounter (Addendum)
Pt called states she has not received a call from vascular doctor to schedule. I reviewed referral information and 02/28/2021 [facility] - C. [place] Crane had spoke to pt and pt stated she would call [facility] to schedule.

## 2019-04-17 ENCOUNTER — Emergency Department (HOSPITAL_COMMUNITY): Payer: Medicare Other

## 2019-04-17 ENCOUNTER — Encounter (HOSPITAL_COMMUNITY): Payer: Self-pay | Admitting: Emergency Medicine

## 2019-04-17 ENCOUNTER — Emergency Department (HOSPITAL_COMMUNITY)
Admission: EM | Admit: 2019-04-17 | Discharge: 2019-04-17 | Disposition: A | Discharge status: Home / Self Care | Payer: Medicare Other | Attending: Emergency Medicine | Admitting: Emergency Medicine

## 2019-04-17 ENCOUNTER — Other Ambulatory Visit: Payer: Self-pay

## 2019-04-17 DIAGNOSIS — Z79899 Other long term (current) drug therapy: Secondary | ICD-10-CM | POA: Diagnosis not present

## 2019-04-17 DIAGNOSIS — I5032 Chronic diastolic (congestive) heart failure: Secondary | ICD-10-CM | POA: Insufficient documentation

## 2019-04-17 DIAGNOSIS — Z7982 Long term (current) use of aspirin: Secondary | ICD-10-CM | POA: Insufficient documentation

## 2019-04-17 DIAGNOSIS — N186 End stage renal disease: Secondary | ICD-10-CM | POA: Diagnosis not present

## 2019-04-17 DIAGNOSIS — I509 Heart failure, unspecified: Secondary | ICD-10-CM | POA: Diagnosis not present

## 2019-04-17 DIAGNOSIS — U071 COVID-19: Secondary | ICD-10-CM | POA: Diagnosis not present

## 2019-04-17 DIAGNOSIS — J069 Acute upper respiratory infection, unspecified: Secondary | ICD-10-CM

## 2019-04-17 DIAGNOSIS — E119 Type 2 diabetes mellitus without complications: Secondary | ICD-10-CM | POA: Diagnosis not present

## 2019-04-17 DIAGNOSIS — I132 Hypertensive heart and chronic kidney disease with heart failure and with stage 5 chronic kidney disease, or end stage renal disease: Secondary | ICD-10-CM | POA: Insufficient documentation

## 2019-04-17 DIAGNOSIS — Z992 Dependence on renal dialysis: Secondary | ICD-10-CM | POA: Diagnosis not present

## 2019-04-17 DIAGNOSIS — I739 Peripheral vascular disease, unspecified: Secondary | ICD-10-CM | POA: Diagnosis not present

## 2019-04-17 DIAGNOSIS — R0602 Shortness of breath: Secondary | ICD-10-CM | POA: Diagnosis present

## 2019-04-17 LAB — CBC WITH DIFFERENTIAL/PLATELET
Abs Immature Granulocytes: 0.01 10*3/uL (ref 0.00–0.07)
Basophils Absolute: 0 10*3/uL (ref 0.0–0.1)
Basophils Relative: 0 %
Eosinophils Absolute: 0.1 10*3/uL (ref 0.0–0.5)
Eosinophils Relative: 2 %
HCT: 30.6 % — ABNORMAL LOW (ref 36.0–46.0)
Hemoglobin: 9.9 g/dL — ABNORMAL LOW (ref 12.0–15.0)
Immature Granulocytes: 0 %
Lymphocytes Relative: 23 %
Lymphs Abs: 0.7 10*3/uL (ref 0.7–4.0)
MCH: 34.6 pg — ABNORMAL HIGH (ref 26.0–34.0)
MCHC: 32.4 g/dL (ref 30.0–36.0)
MCV: 107 fL — ABNORMAL HIGH (ref 80.0–100.0)
Monocytes Absolute: 0.3 10*3/uL (ref 0.1–1.0)
Monocytes Relative: 11 %
Neutro Abs: 2 10*3/uL (ref 1.7–7.7)
Neutrophils Relative %: 64 %
Platelets: 171 10*3/uL (ref 150–400)
RBC: 2.86 MIL/uL — ABNORMAL LOW (ref 3.87–5.11)
RDW: 13.2 % (ref 11.5–15.5)
WBC: 3.1 10*3/uL — ABNORMAL LOW (ref 4.0–10.5)
nRBC: 0 % (ref 0.0–0.2)

## 2019-04-17 LAB — BASIC METABOLIC PANEL
Anion gap: 14 (ref 5–15)
BUN: 36 mg/dL — ABNORMAL HIGH (ref 8–23)
CO2: 30 mmol/L (ref 22–32)
Calcium: 7.6 mg/dL — ABNORMAL LOW (ref 8.9–10.3)
Chloride: 92 mmol/L — ABNORMAL LOW (ref 98–111)
Creatinine, Ser: 10.17 mg/dL — ABNORMAL HIGH (ref 0.44–1.00)
GFR calc Af Amer: 4 mL/min — ABNORMAL LOW (ref >60)
GFR calc non Af Amer: 3 mL/min — ABNORMAL LOW (ref >60)
Glucose, Bld: 134 mg/dL — ABNORMAL HIGH (ref 70–99)
Potassium: 4.4 mmol/L (ref 3.5–5.1)
Sodium: 136 mmol/L (ref 135–145)

## 2019-04-17 LAB — POC SARS CORONAVIRUS 2 AG -  ED: SARS Coronavirus 2 Ag: POSITIVE — AB

## 2019-04-17 LAB — LACTIC ACID, PLASMA: Lactic Acid, Venous: 1.5 mmol/L (ref 0.5–1.9)

## 2019-04-17 MED ORDER — ONDANSETRON HCL 4 MG/2ML IJ SOLN
4.0000 mg | Freq: Once | INTRAMUSCULAR | Status: AC
  Administered 2019-04-17: 18:00:00 4 mg via INTRAVENOUS
  Filled 2019-04-17: qty 2

## 2019-04-17 MED ORDER — ACETAMINOPHEN 500 MG PO TABS
1000.0000 mg | ORAL_TABLET | Freq: Once | ORAL | Status: AC
  Administered 2019-04-17: 1000 mg via ORAL
  Filled 2019-04-17: qty 2

## 2019-04-17 MED ORDER — MORPHINE SULFATE (PF) 4 MG/ML IV SOLN
4.0000 mg | Freq: Once | INTRAVENOUS | Status: AC
  Administered 2019-04-17: 18:00:00 4 mg via INTRAVENOUS
  Filled 2019-04-17: qty 1

## 2019-04-17 NOTE — ED Provider Notes (Signed)
[facility] EMERGENCY DEPARTMENT Provider Note   CSN: [id number] Arrival date & time: 04/17/19  1035     History Chief Complaint  Patient presents with  . Shortness of Breath  . Nasal Congestion    Rachel Hobbs is a 78 y.o. female.  HPI Patient presents to the emergency department with fever and sinus drainage with some shortness of breath since yesterday.  The patient states that she was last dialyzed on Saturday.  Patient states she took some Tylenol this morning for her fever.  The patient denies chest pain,  headache,blurred vision, neck pain, weakness, numbness, dizziness, anorexia, edema, abdominal pain, nausea, vomiting, diarrhea, rash, back pain, dysuria, hematemesis, bloody stool, near syncope, or syncope.    Past Medical History:  Diagnosis Date  . Anemia of renal disease 06/16/2006   Qualifier: Diagnosis of  By: Marinell Blight, Dawn    . Arthritis   . CHF (congestive heart failure) ([place])   . Depression   . Diabetes mellitus    type 2  . HAIR LOSS 08/17/2006   Qualifier: Diagnosis of  By: Girard Cooter MD, MAKEECHA    . Hernia   . Hyperlipidemia   . Hypertension   . Iron deficiency anemia   . Low iron   . Nodule of soft tissue 06/22/2012  . Peripheral vascular disease ([facility])    in legs  . Pneumonia    teenager  . Renal disorder    CKD - dialysis T/TH/Sa  . Shortness of breath dyspnea    with exertion  . SMALL BOWEL OBSTRUCTION, HX OF 02/15/2007   Qualifier: Diagnosis of  By: [name], HEIDI      Patient Active Problem List   Diagnosis Date Noted  . Impacted cerumen of left ear 11/03/2018  . Seasonal allergic rhinitis 11/03/2018  . Painful diabetic neuropathy ([place]) 02/24/2018    Class: Chronic  . Lichen planus 94/76/5465  . Healthcare maintenance 08/23/2016  . Type 2 diabetes mellitus with stage 5 chronic kidney disease not on chronic dialysis, with long-term current use of insulin ([place])   . Chronic constipation 08/13/2013  .  Diastolic CHF ([place]) 03/54/6568  . End stage renal disease ([place]) 06/14/2011  . GOUT, ACUTE 11/03/2009  . FOOT PAIN, RIGHT 12/11/2008  . LEG EDEMA, BILATERAL 12/03/2008  . Other specified abdominal hernia without obstruction or gangrene 07/10/2007  . OSTEOARTHROSIS, LOCAL NOS, OTHER [facility] SITE 12/08/2006  . GLAUCOMA 08/17/2006  . HAIR LOSS 08/17/2006  . DM type 2, controlled, with complication ([place]) 12/75/1700  . HLD (hyperlipidemia) 06/16/2006  . Morbid obesity ([place]) 06/16/2006  . Anemia of renal disease 06/16/2006    Class: Chronic  . HYPERTENSION, BENIGN SYSTEMIC 06/16/2006  . EDEMA-LEGS,DUE TO VENOUS OBSTRUCT. 06/16/2006    Past Surgical History:  Procedure Laterality Date  . ABDOMINAL HYSTERECTOMY     in the 70's  . AV FISTULA PLACEMENT Left 12/05/2015   Procedure: RADIOCEPHALIC VERSUS BRACHIOCEPHALIC ARTERIOVENOUS (AV) FISTULA CREATION;  Surgeon: [name], MD;  Location[facility];  Service: Vascular;  Laterality: Left;  . AV FISTULA PLACEMENT Left 05/20/2017   Procedure: INSERTION OF ARTERIOVENOUS (AV) GORE-TEX VASCULAR STRETCH 4-7 GRAFT ARM LEFT UPPER ARM;  Surgeon: [name], MD;  Location: [facility];  Service: Vascular;  Laterality: Left;  . BASCILIC VEIN TRANSPOSITION Left 06/04/2016   Procedure: FIRST STAGE BASILIC VEIN TRANSPOSITION;  Surgeon: [name], MD;  Location: [place];  Service: Vascular;  Laterality: Left;  . [place]  TRANSPOSITION Left 08/04/2016   Procedure: LEFT UPPER ARM BASCILIC VEIN TRANSPOSITION, SECOND STAGE;  Surgeon: [name], MD;  Location: [place];  Service: Vascular;  Laterality: Left;  . HERNIA REPAIR     umbilical in the 09'G  . INSERTION OF DIALYSIS CATHETER Right 12/05/2015   Procedure: INSERTION OF DIALYSIS CATHETER;  Surgeon: [name], MD;  Location: [place];  Service: Vascular;  Laterality: Right;     OB History   No obstetric history on file.     Family History  Problem Relation Age of Onset  . Kidney disease Mother    . Hypertension Mother   . COPD Father        smoke  . Cancer Father        Lung  . Hypertension Father   . Kidney disease Sister   . Diabetes Daughter     Social History   Tobacco Use  . Smoking status: Never Smoker  . Smokeless tobacco: Never Used  Substance Use Topics  . Alcohol use: No  . Drug use: No    Home Medications Prior to Admission medications   Medication Sig Start Date End Date Taking? Authorizing Provider  acetaminophen (TYLENOL) 500 MG tablet Take 500 mg by mouth every 6 (six) hours as needed for mild pain or headache.    [provider]  aspirin 81 MG tablet Take 81 mg by mouth daily.      [provider]  Aspirin-Calcium Carbonate 81-777 MG TABS Take by mouth.    [provider]  atorvastatin (LIPITOR) 40 MG tablet TAKE 1 TABLET(40 MG) BY MOUTH DAILY AT 6 PM 03/06/19   Gladys Damme, MD  B Complex-C-Folic Acid (NEPHRO VITAMINS) 0.8 MG TABS Take 1 tablet by mouth daily.    [provider]  bisacodyl (BISACODYL) 5 MG EC tablet Take 5 mg by mouth daily as needed for moderate constipation.    [provider]  Blood Glucose Monitoring Suppl (BLOOD GLUCOSE METER) kit Use as instructed 05/09/13   Olam Idler, MD  Blood Glucose Monitoring Suppl [facility] VERIO) w/Device KIT Use daily as indicated 02/24/18   Diallo, Earna Coder, MD  calcium acetate (PHOSLO) 667 MG capsule Take 667-1,334 mg by mouth See admin instructions. Take 1334 mg with each meal and 667 mg with each snack 05/10/16   [provider]  cetirizine (ZYRTEC) 10 MG tablet Take 10 mg by mouth daily as needed for allergies (spring allergy only).    [provider]  ethyl chloride spray APP TO ACCESS PRE-DIALYSIS UTD 08/10/17   [provider]  gabapentin (NEURONTIN) 100 MG capsule TAKE 3 CAPSULES(300 MG) BY MOUTH AT BEDTIME 01/23/19   Gladys Damme, MD  glucose blood [facility] VERIO) test strip Use as instructed 02/24/18   Diallo,  Earna Coder, MD  insulin lispro (HUMALOG KWIKPEN) 100 UNIT/ML KwikPen Inject 0.08 mLs (8 Units total) into the skin daily before supper. 05/05/18   Zenia Resides, MD  Insulin Pen Needle 31G X 8 MM MISC BD UltraFine III Pen Needles. For use with insulin pen device. Inject insulin 2X daily 05/05/18   Zenia Resides, MD  isosorbide mononitrate (IMDUR) 30 MG 24 hr tablet  07/19/18   [provider]  Lancets [facility] ULTRASOFT) lancets Once daily testing plus prn for hypoglycemia 05/09/13   Olam Idler, MD  LANTUS SOLOSTAR 100 UNIT/ML Solostar Pen  09/21/18   [provider]  Multiple Vitamins-Minerals (QUIN B STRONG) TABS Take by mouth.  [provider]  NEEDLE, DISP, 30 G (B-D DISP NEEDLE 30GX1") 30G X 1" MISC 1 each by Does not apply route daily. 05/03/14   Olam Idler, MD  ONE TOUCH ULTRA TEST test strip TEST BLOOD SUGAR EVERY DAY AND AS NEEDED FOR SYMPTOMS OF HYPOGLYCEMIA 08/12/16   Vivi Barrack, MD  [facility] DELICA LANCETS 64B MISC Use daily as indicated 02/24/18   Marjie Skiff, MD  Skin Protectants, Misc. (EUCERIN) cream Apply topically as needed for dry skin. 05/17/17   Everrett Coombe, MD  atorvastatin (LIPITOR) 40 MG tablet Take 1 tablet (40 mg total) by mouth daily at 6 PM. 10/30/18   Gladys Damme, MD    Allergies    Patient has no known allergies.  Review of Systems   Review of Systems All other systems negative except as documented in the HPI. All pertinent positives and negatives as reviewed in the HPI. Physical Exam Updated Vital Signs BP (!) 148/65   Pulse 89   Temp (!) 101.1 F (38.4 C) (Oral)   Resp 19   SpO2 98%   Physical Exam Vitals and nursing note reviewed.  Constitutional:      General: She is not in acute distress.    Appearance: She is well-developed.  HENT:     Head: Normocephalic and atraumatic.  Eyes:     Pupils: Pupils are equal, round, and reactive to light.  Cardiovascular:     Rate and Rhythm: Normal rate and  regular rhythm.     Heart sounds: Normal heart sounds. No murmur. No friction rub. No gallop.   Pulmonary:     Effort: Pulmonary effort is normal. No respiratory distress.     Breath sounds: Normal breath sounds. No wheezing.  Abdominal:     General: Bowel sounds are normal. There is no distension.     Palpations: Abdomen is soft.     Tenderness: There is no abdominal tenderness.  Musculoskeletal:     Cervical back: Normal range of motion and neck supple.  Skin:    General: Skin is warm and dry.     Capillary Refill: Capillary refill takes less than 2 seconds.     Findings: No erythema or rash.  Neurological:     Mental Status: She is alert and oriented to person, place, and time.     Motor: No abnormal muscle tone.     Coordination: Coordination normal.  Psychiatric:        Behavior: Behavior normal.     ED Results / Procedures / Treatments   Labs (all labs ordered are listed, but only abnormal results are displayed) Labs Reviewed  BASIC METABOLIC PANEL - Abnormal; Notable for the following components:      Result Value   Chloride 92 (*)    Glucose, Bld 134 (*)    BUN 36 (*)    Creatinine, Ser 10.17 (*)    Calcium 7.6 (*)    GFR calc non Af Amer 3 (*)    GFR calc Af Amer 4 (*)    All other components within normal limits  CBC WITH DIFFERENTIAL/PLATELET - Abnormal; Notable for the following components:   WBC 3.1 (*)    RBC 2.86 (*)    Hemoglobin 9.9 (*)    HCT 30.6 (*)    MCV 107.0 (*)    MCH 34.6 (*)    All other components within normal limits  CULTURE, BLOOD (ROUTINE X 2)  CULTURE, BLOOD (ROUTINE X 2)  LACTIC ACID, PLASMA  POC SARS CORONAVIRUS 2 AG -  ED    EKG EKG Interpretation  Date/Time:  Tuesday April 17 2019 10:40:05 EST Ventricular Rate:  89 PR Interval:  160 QRS Duration: 98 QT Interval:  382 QTC Calculation: 464 R Axis:   89 Text Interpretation: Normal sinus rhythm Low voltage QRS Cannot rule out Anteroseptal infarct , age undetermined  Abnormal ECG no ischemic  changes, no sig change from old Confirmed by Charlesetta Shanks [phone]) on 04/17/2019 11:08:06 AM   Radiology DG Chest Port 1 View  Result Date: 04/17/2019 CLINICAL DATA:  Fever EXAM: PORTABLE CHEST 1 VIEW COMPARISON:  12/05/2015 FINDINGS: Cardiomegaly. Aortic atherosclerosis. Pulmonary vascular prominence. There may be heterogeneous airspace opacity or alternately atelectasis of the left lung, similar in appearance to prior examination. The visualized skeletal structures are unremarkable. IMPRESSION: 1. Cardiomegaly and pulmonary vascular prominence without overt pulmonary edema. 2. There may be heterogeneous airspace opacity or alternately atelectasis of the left lung, similar in appearance to prior examination. PA and lateral radiographs may be helpful to further evaluate. Electronically Signed   By: Eddie Candle M.D.   On: 04/17/2019 12:55    Procedures Procedures (including critical care time)  Medications Ordered in ED Medications  morphine 4 MG/ML injection 4 mg (has no administration in time range)  ondansetron (ZOFRAN) injection 4 mg (has no administration in time range)  acetaminophen (TYLENOL) tablet 1,000 mg (1,000 mg Oral Given 04/17/19 1450)    ED Course  I have reviewed the triage vital signs and the nursing notes.  Pertinent labs & imaging results that were available during my care of the patient were reviewed by me and considered in my medical decision making (see chart for details).    MDM Rules/Calculators/A&P                      Patient is awaiting laboratory testing.  She most likely has a Covid type infection based on her symptoms and presentation.  Patient has not desaturated did not require any oxygen at this time.. Final Clinical Impression(s) / ED Diagnoses Final diagnoses:  None    Rx / DC Orders ED Discharge Orders    None       Dalia Heading, PA-C 04/17/19 1624    Charlesetta Shanks, MD 04/21/19 1438

## 2019-04-17 NOTE — ED Notes (Signed)
ED Provider at bedside. 

## 2019-04-17 NOTE — ED Provider Notes (Signed)
Medical screening examination/treatment/procedure(s) were conducted as a shared visit with non-physician practitioner(s) and myself.  I personally evaluated the patient during the encounter.  EKG Interpretation  Date/Time:  Tuesday April 17 2019 10:40:05 EST Ventricular Rate:  89 PR Interval:  160 QRS Duration: 98 QT Interval:  382 QTC Calculation: 464 R Axis:   89 Text Interpretation: Normal sinus rhythm Low voltage QRS Cannot rule out Anteroseptal infarct , age undetermined Abnormal ECG no ischemic  changes, no sig change from old Confirmed by Charlesetta Shanks [phone]) on 04/17/2019 11:08:06 AM Patient presents with report of fever.  Temp in the emergency department up to 101.  She reports significant generalized weakness and malaise with shortness of breath.  No productive cough or chest pain.  Patient is dialysis patient she reports she had last dialysis on Saturday.  Patient is alert with clear mental status.  She does not have respiratory distress at rest.  Heart regular.  No gross crackle or wheeze.  Movements are coordinated and symmetric.  Given history of fever and malaise plan to add lactic acid, blood cultures and Covid testing pending.  I agree with plan of management.   Charlesetta Shanks, MD 04/21/19 1438

## 2019-04-17 NOTE — ED Triage Notes (Addendum)
C/o SOB and sinus drainage since yesterday.  Last dialysis on Saturday.  Due for dialysis today.  Took extra strength Tylenol this morning but not sure what time.

## 2019-04-17 NOTE — Discharge Instructions (Addendum)
Dr. Caprice Red office will contact you in the morning to let you know which dialysis center to use. Please refer to the attached instructions.

## 2019-04-17 NOTE — ED Notes (Signed)
Portable at the bedside.

## 2019-04-17 NOTE — ED Provider Notes (Signed)
Patient received in sign out from [place], [place]. Patient with fever and nasal congestion/sinus drainage and occasional shortness of breath since yesterday. Patient has tested covid positive. She is ESRD, on dialysis. Last dialysis was Saturday. Labs are reassuring. No pneumonia on chest film. Vitals are stable and patient does not have increased oxygen demand. Patient discussed with Dr. Eulis Foster and Dr [name] (nephrology). Dr. [name] will contact the patient in the morning to advise on which dialysis center to use.  Care instructions provided and return precautions discussed.  Rachel Hobbs was evaluated in Emergency Department on 04/17/2019 for the symptoms described in the history of present illness. She was evaluated in the context of the global COVID-19 pandemic, which necessitated consideration that the patient might be at risk for infection with the SARS-CoV-2 virus that causes COVID-19. Institutional protocols and algorithms that pertain to the evaluation of patients at risk for COVID-19 are in a state of rapid change based on information released by regulatory bodies including the CDC and federal and state organizations. These policies and algorithms were followed during the patient's care in the ED.  Results for orders placed or performed during the hospital encounter of 70/17/79  Basic metabolic panel  Result Value Ref Range   Sodium 136 135 - 145 mmol/L   Potassium 4.4 3.5 - 5.1 mmol/L   Chloride 92 (L) 98 - 111 mmol/L   CO2 30 22 - 32 mmol/L   Glucose, Bld 134 (H) 70 - 99 mg/dL   BUN 36 (H) 8 - 23 mg/dL   Creatinine, Ser 10.17 (H) 0.44 - 1.00 mg/dL   Calcium 7.6 (L) 8.9 - 10.3 mg/dL   GFR calc non Af Amer 3 (L) >60 mL/min   GFR calc Af Amer 4 (L) >60 mL/min   Anion gap 14 5 - 15  CBC with Differential  Result Value Ref Range   WBC 3.1 (L) 4.0 - 10.5 K/uL   RBC 2.86 (L) 3.87 - 5.11 MIL/uL   Hemoglobin 9.9 (L) 12.0 - 15.0 g/dL   HCT 30.6 (L) 36.0 - 46.0 %   MCV 107.0 (H) 80.0 - 100.0  fL   MCH 34.6 (H) 26.0 - 34.0 pg   MCHC 32.4 30.0 - 36.0 g/dL   RDW 13.2 11.5 - 15.5 %   Platelets 171 150 - 400 K/uL   nRBC 0.0 0.0 - 0.2 %   Neutrophils Relative % 64 %   Neutro Abs 2.0 1.7 - 7.7 K/uL   Lymphocytes Relative 23 %   Lymphs Abs 0.7 0.7 - 4.0 K/uL   Monocytes Relative 11 %   Monocytes Absolute 0.3 0.1 - 1.0 K/uL   Eosinophils Relative 2 %   Eosinophils Absolute 0.1 0.0 - 0.5 K/uL   Basophils Relative 0 %   Basophils Absolute 0.0 0.0 - 0.1 K/uL   Immature Granulocytes 0 %   Abs Immature Granulocytes 0.01 0.00 - 0.07 K/uL  Lactic acid  Result Value Ref Range   Lactic Acid, Venous 1.5 0.5 - 1.9 mmol/L  POC SARS Coronavirus 2 Ag-ED - Nasal Swab (BD Veritor Kit)  Result Value Ref Range   SARS Coronavirus 2 Ag POSITIVE (A) NEGATIVE   DG Chest Port 1 View  Result Date: 04/17/2019 CLINICAL DATA:  Fever EXAM: PORTABLE CHEST 1 VIEW COMPARISON:  12/05/2015 FINDINGS: Cardiomegaly. Aortic atherosclerosis. Pulmonary vascular prominence. There may be heterogeneous airspace opacity or alternately atelectasis of the left lung, similar in appearance to prior examination. The visualized skeletal structures are unremarkable.  IMPRESSION: 1. Cardiomegaly and pulmonary vascular prominence without overt pulmonary edema. 2. There may be heterogeneous airspace opacity or alternately atelectasis of the left lung, similar in appearance to prior examination. PA and lateral radiographs may be helpful to further evaluate. Electronically Signed   By: Eddie Candle M.D.   On: 04/17/2019 12:55        Etta Quill, NP 04/17/19 1943    Daleen Bo, MD 04/17/19 2300

## 2019-04-18 ENCOUNTER — Telehealth: Payer: Self-pay | Admitting: Nurse Practitioner

## 2019-04-18 LAB — SARS CORONAVIRUS 2 (TAT 6-24 HRS): SARS Coronavirus 2: POSITIVE — AB

## 2019-04-18 NOTE — Telephone Encounter (Signed)
Called to Discuss with patient about Covid symptoms and the use of bamlanivimab, a monoclonal antibody infusion for those with mild to moderate Covid symptoms and at a high risk of hospitalization.     Pt is qualified for this infusion at the [facility] infusion center due to co-morbid conditions and/or a member of an at-risk group.    Patient Active Problem List   Diagnosis Date Noted  . Impacted cerumen of left ear 11/03/2018  . Seasonal allergic rhinitis 11/03/2018  . Painful diabetic neuropathy ([place]) 02/24/2018    Class: Chronic  . Lichen planus [id number]  . Healthcare maintenance 08/23/2016  . Type 2 diabetes mellitus with stage 5 chronic kidney disease not on chronic dialysis, with long-term current use of insulin ([place])   . Chronic constipation 08/13/2013  . Diastolic CHF ([place]) [id number]  . End stage renal disease ([place]) 06/14/2011  . GOUT, ACUTE 11/03/2009  . FOOT PAIN, RIGHT 12/11/2008  . LEG EDEMA, BILATERAL 12/03/2008  . Other specified abdominal hernia without obstruction or gangrene 07/10/2007  . OSTEOARTHROSIS, LOCAL NOS, OTHER [facility] SITE 12/08/2006  . GLAUCOMA 08/17/2006  . HAIR LOSS 08/17/2006  . DM type 2, controlled, with complication ([place]) [id number]  . HLD (hyperlipidemia) 06/16/2006  . Morbid obesity ([place]) 06/16/2006  . Anemia of renal disease 06/16/2006    Class: Chronic  . HYPERTENSION, BENIGN SYSTEMIC 06/16/2006  . EDEMA-LEGS,DUE TO VENOUS OBSTRUCT. 06/16/2006      Unable to reach pt

## 2019-04-20 DIAGNOSIS — U071 COVID-19: Secondary | ICD-10-CM

## 2019-04-20 HISTORY — DX: COVID-19: U07.1

## 2019-04-22 DIAGNOSIS — U071 COVID-19: Secondary | ICD-10-CM | POA: Insufficient documentation

## 2019-04-22 LAB — CULTURE, BLOOD (ROUTINE X 2)
Culture: NO GROWTH
Special Requests: ADEQUATE

## 2019-04-24 ENCOUNTER — Ambulatory Visit: Payer: Medicare Other | Admitting: Vascular Surgery

## 2019-04-24 ENCOUNTER — Encounter (HOSPITAL_COMMUNITY): Payer: Medicare Other

## 2019-05-08 ENCOUNTER — Other Ambulatory Visit: Payer: Self-pay | Admitting: Radiology

## 2019-05-08 ENCOUNTER — Other Ambulatory Visit (HOSPITAL_COMMUNITY): Payer: Self-pay | Admitting: Nephrology

## 2019-05-08 DIAGNOSIS — N186 End stage renal disease: Secondary | ICD-10-CM

## 2019-05-09 ENCOUNTER — Other Ambulatory Visit (HOSPITAL_COMMUNITY): Payer: Self-pay | Admitting: Nephrology

## 2019-05-09 ENCOUNTER — Other Ambulatory Visit: Payer: Self-pay

## 2019-05-09 ENCOUNTER — Encounter (HOSPITAL_COMMUNITY): Payer: Self-pay

## 2019-05-09 ENCOUNTER — Ambulatory Visit (HOSPITAL_COMMUNITY)
Admission: RE | Admit: 2019-05-09 | Discharge: 2019-05-09 | Disposition: A | Discharge status: Home / Self Care | Payer: Medicare Other | Source: Ambulatory Visit | Attending: Nephrology | Admitting: Nephrology

## 2019-05-09 DIAGNOSIS — T82868A Thrombosis of vascular prosthetic devices, implants and grafts, initial encounter: Secondary | ICD-10-CM | POA: Insufficient documentation

## 2019-05-09 DIAGNOSIS — Z841 Family history of disorders of kidney and ureter: Secondary | ICD-10-CM | POA: Insufficient documentation

## 2019-05-09 DIAGNOSIS — Z79899 Other long term (current) drug therapy: Secondary | ICD-10-CM | POA: Diagnosis not present

## 2019-05-09 DIAGNOSIS — N186 End stage renal disease: Secondary | ICD-10-CM | POA: Diagnosis not present

## 2019-05-09 DIAGNOSIS — Z95828 Presence of other vascular implants and grafts: Secondary | ICD-10-CM | POA: Diagnosis not present

## 2019-05-09 DIAGNOSIS — Z833 Family history of diabetes mellitus: Secondary | ICD-10-CM | POA: Diagnosis not present

## 2019-05-09 DIAGNOSIS — Z7982 Long term (current) use of aspirin: Secondary | ICD-10-CM | POA: Insufficient documentation

## 2019-05-09 DIAGNOSIS — E1122 Type 2 diabetes mellitus with diabetic chronic kidney disease: Secondary | ICD-10-CM | POA: Diagnosis not present

## 2019-05-09 DIAGNOSIS — M1990 Unspecified osteoarthritis, unspecified site: Secondary | ICD-10-CM | POA: Diagnosis not present

## 2019-05-09 DIAGNOSIS — I509 Heart failure, unspecified: Secondary | ICD-10-CM | POA: Diagnosis not present

## 2019-05-09 DIAGNOSIS — I132 Hypertensive heart and chronic kidney disease with heart failure and with stage 5 chronic kidney disease, or end stage renal disease: Secondary | ICD-10-CM | POA: Diagnosis not present

## 2019-05-09 DIAGNOSIS — Z8249 Family history of ischemic heart disease and other diseases of the circulatory system: Secondary | ICD-10-CM | POA: Diagnosis not present

## 2019-05-09 DIAGNOSIS — Z992 Dependence on renal dialysis: Secondary | ICD-10-CM | POA: Insufficient documentation

## 2019-05-09 DIAGNOSIS — Z794 Long term (current) use of insulin: Secondary | ICD-10-CM | POA: Insufficient documentation

## 2019-05-09 DIAGNOSIS — Y832 Surgical operation with anastomosis, bypass or graft as the cause of abnormal reaction of the patient, or of later complication, without mention of misadventure at the time of the procedure: Secondary | ICD-10-CM | POA: Insufficient documentation

## 2019-05-09 DIAGNOSIS — E1151 Type 2 diabetes mellitus with diabetic peripheral angiopathy without gangrene: Secondary | ICD-10-CM | POA: Diagnosis not present

## 2019-05-09 DIAGNOSIS — Z8616 Personal history of COVID-19: Secondary | ICD-10-CM | POA: Insufficient documentation

## 2019-05-09 DIAGNOSIS — E785 Hyperlipidemia, unspecified: Secondary | ICD-10-CM | POA: Insufficient documentation

## 2019-05-09 HISTORY — PX: IR THROMBECTOMY AV FISTULA W/THROMBOLYSIS/PTA INC/SHUNT/IMG LEFT: IMG6106

## 2019-05-09 HISTORY — PX: IR US GUIDE VASC ACCESS LEFT: IMG2389

## 2019-05-09 LAB — GLUCOSE, CAPILLARY
Glucose-Capillary: 81 mg/dL (ref 70–99)
Glucose-Capillary: 59 mg/dL — ABNORMAL LOW (ref 70–99)
Glucose-Capillary: 84 mg/dL (ref 70–99)

## 2019-05-09 MED ORDER — LIDOCAINE HCL (PF) 1 % IJ SOLN
INTRAMUSCULAR | Status: AC | PRN
  Administered 2019-05-09: 5 mL

## 2019-05-09 MED ORDER — FLUMAZENIL 0.5 MG/5ML IV SOLN
INTRAVENOUS | Status: AC
  Filled 2019-05-09: qty 5

## 2019-05-09 MED ORDER — SODIUM CHLORIDE 0.9 % IV SOLN: INTRAVENOUS | Status: DC

## 2019-05-09 MED ORDER — ALTEPLASE 2 MG IJ SOLR
INTRAMUSCULAR | Status: AC | PRN
  Administered 2019-05-09 (×2): 1 mg

## 2019-05-09 MED ORDER — HEPARIN SODIUM (PORCINE) 1000 UNIT/ML IJ SOLN
INTRAMUSCULAR | Status: AC
  Filled 2019-05-09: qty 1

## 2019-05-09 MED ORDER — NALOXONE HCL 0.4 MG/ML IJ SOLN
INTRAMUSCULAR | Status: AC
  Filled 2019-05-09: qty 1

## 2019-05-09 MED ORDER — LIDOCAINE HCL 1 % IJ SOLN
INTRAMUSCULAR | Status: AC
  Filled 2019-05-09: qty 20

## 2019-05-09 MED ORDER — HEPARIN SODIUM (PORCINE) 1000 UNIT/ML IJ SOLN
INTRAMUSCULAR | Status: AC | PRN
  Administered 2019-05-09: 3000 [IU]

## 2019-05-09 MED ORDER — MIDAZOLAM HCL 2 MG/2ML IJ SOLN
INTRAMUSCULAR | Status: AC | PRN
  Administered 2019-05-09: 1 mg via INTRAVENOUS
  Administered 2019-05-09: 0.5 mg via INTRAVENOUS

## 2019-05-09 MED ORDER — ALTEPLASE 2 MG IJ SOLR
INTRAMUSCULAR | Status: AC
  Filled 2019-05-09: qty 2

## 2019-05-09 MED ORDER — FENTANYL CITRATE (PF) 100 MCG/2ML IJ SOLN
INTRAMUSCULAR | Status: AC
  Filled 2019-05-09: qty 4

## 2019-05-09 MED ORDER — MIDAZOLAM HCL 2 MG/2ML IJ SOLN
INTRAMUSCULAR | Status: AC
  Filled 2019-05-09: qty 4

## 2019-05-09 MED ORDER — FENTANYL CITRATE (PF) 100 MCG/2ML IJ SOLN
INTRAMUSCULAR | Status: AC | PRN
  Administered 2019-05-09: 25 ug via INTRAVENOUS
  Administered 2019-05-09: 50 ug via INTRAVENOUS

## 2019-05-09 MED ORDER — IOHEXOL 300 MG/ML  SOLN
100.0000 mL | Freq: Once | INTRAMUSCULAR | Status: AC | PRN
  Administered 2019-05-09: 50 mL via INTRAVENOUS

## 2019-05-09 NOTE — Progress Notes (Addendum)
Pt states she drove herself today. And has no one to drive her home Rachel Hobbs, [place] called and informed . After talking with pt she states her daughter could come after she drives a bus around [phone]. Pt states she had a fever yesterday. She was positive for COVID 04-17-2019. [name] PA informed of above.

## 2019-05-09 NOTE — Procedures (Signed)
Interventional Radiology Procedure Note  Procedure: Left upper arm AVGG declot  Complications: Focal graft disruption with contrast extravasation  Estimated Blood Loss: < 10 mL  Findings: Successful declot of left upper arm AVGG after thrombectomy and balloon angioplasty of venous anastamosis and intragraft stenoses. Focal graft injury with contrast extravasation treated with sustained balloon inflations x 3.  Venetia Night. Kathlene Cote, M.D Pager:  [phone]

## 2019-05-09 NOTE — H&P (Signed)
Chief Complaint: Clotted AV graft  Referring Physician(s): Claudia Desanctis  Supervising Physician: Aletta Edouard  Patient Status: [facility] - Out-pt  History of Present Illness: Rachel Hobbs is a 79 y.o. female with end stage renal disease on hemodialysis via left upper arm AV graft.  Her last dialysis was Saturday, it was a full treatment.  Attempted dialysis yesterday but graft was thrombosed.  She was COVID + 22 days ago. She had a fever yesterday at Dialysis, but no fever since that time.  We will treat her as COVID + with full PPE.  Past Medical History:  Diagnosis Date  . Anemia of renal disease 06/16/2006   Qualifier: Diagnosis of  By: Marinell Blight, Dawn    . Arthritis   . CHF (congestive heart failure) ([place])   . Depression   . Diabetes mellitus    type 2  . HAIR LOSS 08/17/2006   Qualifier: Diagnosis of  By: Girard Cooter MD, MAKEECHA    . Hernia   . Hyperlipidemia   . Hypertension   . Iron deficiency anemia   . Low iron   . Nodule of soft tissue 06/22/2012  . Peripheral vascular disease ([facility])    in legs  . Pneumonia    teenager  . Renal disorder    CKD - dialysis T/TH/Sa  . Shortness of breath dyspnea    with exertion  . SMALL BOWEL OBSTRUCTION, HX OF 02/15/2007   Qualifier: Diagnosis of  By: [name], HEIDI      Past Surgical History:  Procedure Laterality Date  . ABDOMINAL HYSTERECTOMY     in the 70's  . AV FISTULA PLACEMENT Left 12/05/2015   Procedure: RADIOCEPHALIC VERSUS BRACHIOCEPHALIC ARTERIOVENOUS (AV) FISTULA CREATION;  Surgeon: [name], MD;  Location: [facility] OR;  Service: Vascular;  Laterality: Left;  . AV FISTULA PLACEMENT Left 05/20/2017   Procedure: INSERTION OF ARTERIOVENOUS (AV) GORE-TEX VASCULAR STRETCH 4-7 GRAFT ARM LEFT UPPER ARM;  Surgeon: [name], MD;  Location: [place];  Service: Vascular;  Laterality: Left;  . BASCILIC VEIN TRANSPOSITION Left 06/04/2016   Procedure: FIRST STAGE BASILIC VEIN TRANSPOSITION;  Surgeon:  [name], MD;  Location: [place];  Service: Vascular;  Laterality: Left;  . BASCILIC VEIN TRANSPOSITION Left 08/04/2016   Procedure: LEFT UPPER ARM [place], SECOND STAGE;  Surgeon: [name], MD;  Location: [place];  Service: Vascular;  Laterality: Left;  . HERNIA REPAIR     umbilical in the 99'I  . INSERTION OF DIALYSIS CATHETER Right 12/05/2015   Procedure: INSERTION OF DIALYSIS CATHETER;  Surgeon: [name], MD;  Location: [place];  Service: Vascular;  Laterality: Right;    Allergies: Patient has no known allergies.  Medications: Prior to Admission medications   Medication Sig Start Date End Date Taking? Authorizing Provider  acetaminophen (TYLENOL) 500 MG tablet Take 500 mg by mouth every 6 (six) hours as needed for mild pain or headache.   Yes [provider]  aspirin 81 MG tablet Take 81 mg by mouth daily.     Yes [provider]  atorvastatin (LIPITOR) 40 MG tablet TAKE 1 TABLET(40 MG) BY MOUTH DAILY AT 6 PM 03/06/19  Yes Gladys Damme, MD  B Complex-C-Folic Acid (NEPHRO VITAMINS) 0.8 MG TABS Take 1 tablet by mouth daily.   Yes [provider]  bisacodyl (BISACODYL) 5 MG EC tablet Take 5 mg by mouth daily as needed for moderate constipation.  Yes [provider]  calcium acetate (PHOSLO) 667 MG capsule Take 667-1,334 mg by mouth See admin instructions. Take 1334 mg with each meal and 667 mg with each snack 05/10/16  Yes [provider]  ethyl chloride spray APP TO ACCESS PRE-DIALYSIS UTD 08/10/17  Yes [provider]  gabapentin (NEURONTIN) 100 MG capsule TAKE 3 CAPSULES(300 MG) BY MOUTH AT BEDTIME 01/23/19  Yes Gladys Damme, MD  insulin lispro (HUMALOG KWIKPEN) 100 UNIT/ML KwikPen Inject 0.08 mLs (8 Units total) into the skin daily before supper. 05/05/18  Yes Hensel, Jamal Collin, MD  isosorbide mononitrate (IMDUR) 30 MG 24 hr tablet  07/19/18  Yes [provider]  LANTUS SOLOSTAR 100  UNIT/ML Solostar Pen  09/21/18  Yes [provider]  Aspirin-Calcium Carbonate 81-777 MG TABS Take by mouth.    [provider]  Blood Glucose Monitoring Suppl (BLOOD GLUCOSE METER) kit Use as instructed 05/09/13   Olam Idler, MD  Blood Glucose Monitoring Suppl [facility] VERIO) w/Device KIT Use daily as indicated 02/24/18   Diallo, Earna Coder, MD  cetirizine (ZYRTEC) 10 MG tablet Take 10 mg by mouth daily as needed for allergies (spring allergy only).    [provider]  glucose blood (ONETOUCH VERIO) test strip Use as instructed 02/24/18   Diallo, Earna Coder, MD  Insulin Pen Needle 31G X 8 MM MISC BD UltraFine III Pen Needles. For use with insulin pen device. Inject insulin 2X daily 05/05/18   Zenia Resides, MD  Lancets [facility] ULTRASOFT) lancets Once daily testing plus prn for hypoglycemia 05/09/13   Olam Idler, MD  Multiple Vitamins-Minerals (QUIN B STRONG) TABS Take by mouth.    [provider]  NEEDLE, DISP, 30 G (B-D DISP NEEDLE 30GX1") 30G X 1" MISC 1 each by Does not apply route daily. 05/03/14   Olam Idler, MD  ONE TOUCH ULTRA TEST test strip TEST BLOOD SUGAR EVERY DAY AND AS NEEDED FOR SYMPTOMS OF HYPOGLYCEMIA 08/12/16   Vivi Barrack, MD  [facility] DELICA LANCETS 25D MISC Use daily as indicated 02/24/18   Marjie Skiff, MD  Skin Protectants, Misc. (EUCERIN) cream Apply topically as needed for dry skin. 05/17/17   Everrett Coombe, MD  atorvastatin (LIPITOR) 40 MG tablet Take 1 tablet (40 mg total) by mouth daily at 6 PM. 10/30/18   Gladys Damme, MD     Family History  Problem Relation Age of Onset  . Kidney disease Mother   . Hypertension Mother   . COPD Father        smoke  . Cancer Father        Lung  . Hypertension Father   . Kidney disease Sister   . Diabetes Daughter     Social History   Socioeconomic History  . Marital status: Single    Spouse name: Not on file  . Number of children: 1  . Years of education: 58  .  Highest education level: 12th grade  Occupational History  . Occupation: Retired- [employer]     Employer: RETIRED  Tobacco Use  . Smoking status: Never Smoker  . Smokeless tobacco: Never Used  Substance and Sexual Activity  . Alcohol use: No  . Drug use: No  . Sexual activity: Not Currently  Other Topics Concern  . Not on file  Social History Narrative   Current Social History       Patient lives with daughter [name]) in one level home with ramp; has smoke alarms, no tripping hazards, no  grab bars in bathroom.   Picky eater; likes to eat "bad stuff", some green vegetables, likes carbs.   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                                                                                                          Social Determinants of Health   Financial Resource Strain:   . Difficulty of Paying Living Expenses: Not on file  Food Insecurity:   . Worried About Charity fundraiser in the Last Year: Not on file  . Ran Out of Food in the Last Year: Not on file  Transportation Needs:   . Lack of Transportation (Medical): Not on file  . Lack of Transportation (Non-Medical): Not on file  Physical Activity:   . Days of Exercise per Week: Not on file  . Minutes of Exercise per Session: Not on file  Stress:   . Feeling of Stress : Not on file  Social Connections:   . Frequency of Communication with Friends and Family: Not on file  . Frequency of Social Gatherings with Friends and Family: Not on file  . Attends Religious Services: Not on file  . Active Member of Clubs or Organizations: Not on file  . Attends Archivist Meetings:  Not on file  . Marital Status: Not on file     Review of Systems: A 12 point ROS discussed and pertinent positives are indicated in the HPI above.  All other systems are negative.  Review of Systems  Vital Signs: BP (!) 144/72   Pulse 83   Temp 98.2 F (36.8 C) (Oral)   Resp 18   Ht 5' 2"  (1.575 m)   Wt 103.4 kg   SpO2 100%   BMI 41.70 kg/m   Physical Exam Vitals reviewed.  Constitutional:      Appearance: She is obese.  HENT:     Head: Normocephalic and atraumatic.  Eyes:     Extraocular Movements: Extraocular movements intact.  Cardiovascular:     Rate and Rhythm: Normal rate and regular rhythm.  Pulmonary:     Effort: Pulmonary effort is normal. No respiratory distress.  Abdominal:     General: There is no distension.     Palpations: Abdomen is soft.     Tenderness: There is no abdominal tenderness.  Musculoskeletal:        General: Normal range of motion.       Arms:     Comments: Left arm AV graft, no thrill,no bruit, c/w thrombosis.  Skin:    General: Skin is warm and dry.  Neurological:     General: No focal  deficit present.     Mental Status: She is alert and oriented to person, place, and time.  Psychiatric:        Mood and Affect: Mood normal.        Behavior: Behavior normal.        Thought Content: Thought content normal.        Judgment: Judgment normal.     Imaging: DG Chest Port 1 View  Result Date: 04/17/2019 CLINICAL DATA:  Fever EXAM: PORTABLE CHEST 1 VIEW COMPARISON:  12/05/2015 FINDINGS: Cardiomegaly. Aortic atherosclerosis. Pulmonary vascular prominence. There may be heterogeneous airspace opacity or alternately atelectasis of the left lung, similar in appearance to prior examination. The visualized skeletal structures are unremarkable. IMPRESSION: 1. Cardiomegaly and pulmonary vascular prominence without overt pulmonary edema. 2. There may be heterogeneous airspace opacity or alternately atelectasis of the left lung, similar in  appearance to prior examination. PA and lateral radiographs may be helpful to further evaluate. Electronically Signed   By: Eddie Candle M.D.   On: 04/17/2019 12:55    Labs:  CBC: Recent Labs    04/17/19 1240  WBC 3.1*  HGB 9.9*  HCT 30.6*  PLT 171    COAGS: No results for input(s): INR, APTT in the last 8760 hours.  BMP: Recent Labs    04/17/19 1240  NA 136  K 4.4  CL 92*  CO2 30  GLUCOSE 134*  BUN 36*  CALCIUM 7.6*  CREATININE 10.17*  GFRNONAA 3*  GFRAA 4*    LIVER FUNCTION TESTS: No results for input(s): BILITOT, AST, ALT, ALKPHOS, PROT, ALBUMIN in the last 8760 hours.  TUMOR MARKERS: No results for input(s): AFPTM, CEA, CA199, CHROMGRNA in the last 8760 hours.  Assessment and Plan:  Thrombosed hemodialysis graft  Will proceed with thrombectomy today by Dr.Yamagata,  possible tunneled dialysis catheter if unsuccessful.  Risks and benefits discussed with the patient including, but not limited to bleeding, infection, vascular injury, pulmonary embolism, need for tunneled HD catheter placement or even death.  All of the patient's questions were answered, patient is agreeable to proceed. Consent signed and in chart.  Risks and benefits discussed with the patient including, but not limited to bleeding, infection, vascular injury, pneumothorax which may require chest tube placement, air embolism or even death  All of the patient's questions were answered, patient is agreeable to proceed. Consent signed and in chart.  Thank you for this interesting consult.  I greatly enjoyed meeting Rachel Hobbs and look forward to participating in their care.  A copy of this report was sent to the requesting provider on this date.  Electronically Signed: Murrell Redden, PA-C   05/09/2019, 10:36 AM      I spent a total of  30 Minutes in face to face in clinical consultation, greater than 50% of which was counseling/coordinating care for AV graft declot.

## 2019-05-09 NOTE — Progress Notes (Signed)
Attempted to call Pt daughter message left to return call.

## 2019-05-09 NOTE — Discharge Instructions (Signed)

## 2019-05-09 NOTE — Progress Notes (Signed)
Message left for pt daughter to return call.

## 2019-05-15 ENCOUNTER — Ambulatory Visit: Payer: Medicare Other | Admitting: Vascular Surgery

## 2019-05-15 ENCOUNTER — Encounter (HOSPITAL_COMMUNITY): Payer: Medicare Other

## 2019-05-20 DIAGNOSIS — E440 Moderate protein-calorie malnutrition: Secondary | ICD-10-CM | POA: Insufficient documentation

## 2019-05-26 ENCOUNTER — Emergency Department (HOSPITAL_COMMUNITY)
Admission: EM | Admit: 2019-05-26 | Discharge: 2019-05-26 | Disposition: A | Discharge status: Home / Self Care | Payer: Medicare Other | Attending: Emergency Medicine | Admitting: Emergency Medicine

## 2019-05-26 ENCOUNTER — Encounter (HOSPITAL_COMMUNITY): Payer: Self-pay

## 2019-05-26 ENCOUNTER — Emergency Department (HOSPITAL_COMMUNITY): Payer: Medicare Other

## 2019-05-26 ENCOUNTER — Other Ambulatory Visit: Payer: Self-pay

## 2019-05-26 DIAGNOSIS — I132 Hypertensive heart and chronic kidney disease with heart failure and with stage 5 chronic kidney disease, or end stage renal disease: Secondary | ICD-10-CM | POA: Diagnosis not present

## 2019-05-26 DIAGNOSIS — W010XXA Fall on same level from slipping, tripping and stumbling without subsequent striking against object, initial encounter: Secondary | ICD-10-CM | POA: Diagnosis not present

## 2019-05-26 DIAGNOSIS — N186 End stage renal disease: Secondary | ICD-10-CM | POA: Diagnosis not present

## 2019-05-26 DIAGNOSIS — Z7982 Long term (current) use of aspirin: Secondary | ICD-10-CM | POA: Diagnosis not present

## 2019-05-26 DIAGNOSIS — E1122 Type 2 diabetes mellitus with diabetic chronic kidney disease: Secondary | ICD-10-CM | POA: Insufficient documentation

## 2019-05-26 DIAGNOSIS — S63641A Sprain of metacarpophalangeal joint of right thumb, initial encounter: Secondary | ICD-10-CM | POA: Diagnosis not present

## 2019-05-26 DIAGNOSIS — Y92538 Other ambulatory health services establishments as the place of occurrence of the external cause: Secondary | ICD-10-CM | POA: Diagnosis not present

## 2019-05-26 DIAGNOSIS — Z79899 Other long term (current) drug therapy: Secondary | ICD-10-CM | POA: Insufficient documentation

## 2019-05-26 DIAGNOSIS — S6991XA Unspecified injury of right wrist, hand and finger(s), initial encounter: Secondary | ICD-10-CM | POA: Diagnosis present

## 2019-05-26 DIAGNOSIS — Z992 Dependence on renal dialysis: Secondary | ICD-10-CM | POA: Diagnosis not present

## 2019-05-26 DIAGNOSIS — Y999 Unspecified external cause status: Secondary | ICD-10-CM | POA: Insufficient documentation

## 2019-05-26 DIAGNOSIS — Y9301 Activity, walking, marching and hiking: Secondary | ICD-10-CM | POA: Diagnosis not present

## 2019-05-26 DIAGNOSIS — I5032 Chronic diastolic (congestive) heart failure: Secondary | ICD-10-CM | POA: Diagnosis not present

## 2019-05-26 DIAGNOSIS — S62111A Displaced fracture of triquetrum [cuneiform] bone, right wrist, initial encounter for closed fracture: Secondary | ICD-10-CM | POA: Diagnosis not present

## 2019-05-26 DIAGNOSIS — W19XXXA Unspecified fall, initial encounter: Secondary | ICD-10-CM

## 2019-05-26 DIAGNOSIS — Z794 Long term (current) use of insulin: Secondary | ICD-10-CM | POA: Diagnosis not present

## 2019-05-26 NOTE — Discharge Instructions (Signed)
You were seen in the emergency department for evaluation of injuries from a fall.  You had an x-ray of your right hand and wrist.  There is no obvious injury at the thumb where you are most tender but they did see some evidence of a wrist chip fracture.  You put you in a splint for comfort.  Please contact Dr. Jeannie Fend hand surgery for follow-up in the office.  Return if any worsening or concerning symptoms.

## 2019-05-26 NOTE — ED Provider Notes (Signed)
[place] EMERGENCY DEPARTMENT Provider Note   CSN: [id number] Arrival date & time: 05/26/19  1741     History Chief Complaint  Patient presents with  . Fall    Rachel Hobbs is a 79 y.o. female.  She is right-hand dominant and uses a cane.  She said she was walking into dialysis today when she tripped and fell injuring her right hand.  She denies any head strike or loss of consciousness.  No other complaints other than right thumb pain.  She went into dialysis and completed her treatment and then came here for evaluation of her hand pain.  Its moderate aching pain worse with movement and improved with position.  No headache chest pain shortness of breath numbness or weakness.  The history is provided by the patient.  Fall This is a new problem. The current episode started 6 to 12 hours ago. The problem occurs rarely. The problem has not changed since onset.Pertinent negatives include no chest pain, no abdominal pain, no headaches and no shortness of breath. The symptoms are aggravated by bending. The symptoms are relieved by position. She has tried nothing for the symptoms. The treatment provided no relief.       Past Medical History:  Diagnosis Date  . Anemia of renal disease 06/16/2006   Qualifier: Diagnosis of  By: Marinell Blight, Dawn    . Arthritis   . CHF (congestive heart failure) ([place])   . Depression   . Diabetes mellitus    type 2  . HAIR LOSS 08/17/2006   Qualifier: Diagnosis of  By: Girard Cooter MD, MAKEECHA    . Hernia   . Hyperlipidemia   . Hypertension   . Iron deficiency anemia   . Low iron   . Nodule of soft tissue 06/22/2012  . Peripheral vascular disease (HCC)    in legs  . Pneumonia    teenager  . Renal disorder    CKD - dialysis T/TH/Sa  . Shortness of breath dyspnea    with exertion  . SMALL BOWEL OBSTRUCTION, HX OF 02/15/2007   Qualifier: Diagnosis of  By: [name], HEIDI      Patient Active Problem List   Diagnosis Date  Noted  . Impacted cerumen of left ear 11/03/2018  . Seasonal allergic rhinitis 11/03/2018  . Painful diabetic neuropathy ([place]) 02/24/2018    Class: Chronic  . Lichen planus 27/74/1287  . Healthcare maintenance 08/23/2016  . Type 2 diabetes mellitus with stage 5 chronic kidney disease not on chronic dialysis, with long-term current use of insulin ([place])   . Chronic constipation 08/13/2013  . Diastolic CHF ([place]) 86/76/7209  . End stage renal disease ([place]) 06/14/2011  . GOUT, ACUTE 11/03/2009  . FOOT PAIN, RIGHT 12/11/2008  . LEG EDEMA, BILATERAL 12/03/2008  . Other specified abdominal hernia without obstruction or gangrene 07/10/2007  . OSTEOARTHROSIS, LOCAL NOS, OTHER [facility] SITE 12/08/2006  . GLAUCOMA 08/17/2006  . HAIR LOSS 08/17/2006  . DM type 2, controlled, with complication ([place]) 47/12/6281  . HLD (hyperlipidemia) 06/16/2006  . Morbid obesity ([place]) 06/16/2006  . Anemia of renal disease 06/16/2006    Class: Chronic  . HYPERTENSION, BENIGN SYSTEMIC 06/16/2006  . EDEMA-LEGS,DUE TO VENOUS OBSTRUCT. 06/16/2006    Past Surgical History:  Procedure Laterality Date  . ABDOMINAL HYSTERECTOMY     in the 70's  . AV FISTULA PLACEMENT Left 12/05/2015   Procedure: RADIOCEPHALIC VERSUS BRACHIOCEPHALIC ARTERIOVENOUS (AV) FISTULA CREATION;  Surgeon: [name], MD;  Location: [facility] OR;  Service: Vascular;  Laterality: Left;  . AV FISTULA PLACEMENT Left 05/20/2017   Procedure: INSERTION OF ARTERIOVENOUS (AV) GORE-TEX VASCULAR STRETCH 4-7 GRAFT ARM LEFT UPPER ARM;  Surgeon: [name], MD;  Location: [place];  Service: Vascular;  Laterality: Left;  . BASCILIC VEIN TRANSPOSITION Left 06/04/2016   Procedure: FIRST STAGE BASILIC VEIN TRANSPOSITION;  Surgeon: [name], MD;  Location: [place];  Service: Vascular;  Laterality: Left;  . BASCILIC VEIN TRANSPOSITION Left 08/04/2016   Procedure: LEFT UPPER ARM [place], SECOND STAGE;  Surgeon: [name], MD;  Location: [place];   Service: Vascular;  Laterality: Left;  . HERNIA REPAIR     umbilical in the 57'Q  . INSERTION OF DIALYSIS CATHETER Right 12/05/2015   Procedure: INSERTION OF DIALYSIS CATHETER;  Surgeon: [name], MD;  Location: [place];  Service: Vascular;  Laterality: Right;  . IR THROMBECTOMY AV FISTULA W/THROMBOLYSIS/PTA INC/SHUNT/IMG LEFT Left 05/09/2019  . IR US GUIDE VASC ACCESS LEFT  05/09/2019     OB History   No obstetric history on file.     Family History  Problem Relation Age of Onset  . Kidney disease Mother   . Hypertension Mother   . COPD Father        smoke  . Cancer Father        Lung  . Hypertension Father   . Kidney disease Sister   . Diabetes Daughter     Social History   Tobacco Use  . Smoking status: Never Smoker  . Smokeless tobacco: Never Used  Substance Use Topics  . Alcohol use: No  . Drug use: No    Home Medications Prior to Admission medications   Medication Sig Start Date End Date Taking? Authorizing Provider  acetaminophen (TYLENOL) 500 MG tablet Take 500 mg by mouth every 6 (six) hours as needed for mild pain or headache.    [provider]  aspirin 81 MG tablet Take 81 mg by mouth daily.      [provider]  Aspirin-Calcium Carbonate 81-777 MG TABS Take by mouth.    [provider]  atorvastatin (LIPITOR) 40 MG tablet TAKE 1 TABLET(40 MG) BY MOUTH DAILY AT 6 PM 03/06/19   Gladys Damme, MD  B Complex-C-Folic Acid (NEPHRO VITAMINS) 0.8 MG TABS Take 1 tablet by mouth daily.    [provider]  bisacodyl (BISACODYL) 5 MG EC tablet Take 5 mg by mouth daily as needed for moderate constipation.    [provider]  Blood Glucose Monitoring Suppl (BLOOD GLUCOSE METER) kit Use as instructed 05/09/13   Olam Idler, MD  Blood Glucose Monitoring Suppl [facility] VERIO) w/Device KIT Use daily as indicated 02/24/18   Diallo, Earna Coder, MD  calcium acetate (PHOSLO) 667 MG capsule Take 667-1,334 mg by mouth See admin  instructions. Take 1334 mg with each meal and 667 mg with each snack 05/10/16   [provider]  cetirizine (ZYRTEC) 10 MG tablet Take 10 mg by mouth daily as needed for allergies (spring allergy only).    [provider]  ethyl chloride spray APP TO ACCESS PRE-DIALYSIS UTD 08/10/17   [provider]  gabapentin (NEURONTIN) 100 MG capsule TAKE 3 CAPSULES(300 MG) BY MOUTH AT BEDTIME 01/23/19   Gladys Damme, MD  glucose blood [facility] VERIO) test strip Use as instructed 02/24/18   Diallo, Earna Coder, MD  insulin lispro (HUMALOG KWIKPEN) 100 UNIT/ML KwikPen Inject 0.08 mLs (8 Units total)  into the skin daily before supper. 05/05/18   Zenia Resides, MD  Insulin Pen Needle 31G X 8 MM MISC BD UltraFine III Pen Needles. For use with insulin pen device. Inject insulin 2X daily 05/05/18   Zenia Resides, MD  isosorbide mononitrate (IMDUR) 30 MG 24 hr tablet  07/19/18   [provider]  Lancets [facility] ULTRASOFT) lancets Once daily testing plus prn for hypoglycemia 05/09/13   Olam Idler, MD  LANTUS SOLOSTAR 100 UNIT/ML Solostar Pen  09/21/18   [provider]  Multiple Vitamins-Minerals (QUIN B STRONG) TABS Take by mouth.    [provider]  NEEDLE, DISP, 30 G (B-D DISP NEEDLE 30GX1") 30G X 1" MISC 1 each by Does not apply route daily. 05/03/14   Olam Idler, MD  ONE TOUCH ULTRA TEST test strip TEST BLOOD SUGAR EVERY DAY AND AS NEEDED FOR SYMPTOMS OF HYPOGLYCEMIA 08/12/16   Vivi Barrack, MD  [facility] DELICA LANCETS 45X MISC Use daily as indicated 02/24/18   Marjie Skiff, MD  Skin Protectants, Misc. (EUCERIN) cream Apply topically as needed for dry skin. 05/17/17   Everrett Coombe, MD  atorvastatin (LIPITOR) 40 MG tablet Take 1 tablet (40 mg total) by mouth daily at 6 PM. 10/30/18   Gladys Damme, MD    Allergies    Patient has no known allergies.  Review of Systems   Review of Systems  Constitutional: Negative for fever.  HENT:  Negative for sore throat.   Eyes: Negative for visual disturbance.  Respiratory: Negative for shortness of breath.   Cardiovascular: Negative for chest pain.  Gastrointestinal: Negative for abdominal pain.  Genitourinary: Negative for dysuria.  Musculoskeletal: Negative for neck pain.  Skin: Negative for rash.  Neurological: Negative for headaches.    Physical Exam Updated Vital Signs BP (!) 139/59 (BP Location: Right Arm)   Pulse 81   Temp 97.8 F (36.6 C) (Oral)   Resp 17   SpO2 100%   Physical Exam Constitutional:      Appearance: She is well-developed.  HENT:     Head: Normocephalic and atraumatic.  Eyes:     Conjunctiva/sclera: Conjunctivae normal.  Cardiovascular:     Rate and Rhythm: Normal rate and regular rhythm.     Pulses: Normal pulses.  Pulmonary:     Effort: Pulmonary effort is normal.     Breath sounds: Normal breath sounds.  Abdominal:     Palpations: Abdomen is soft. There is no mass.     Tenderness: There is no abdominal tenderness.  Musculoskeletal:        General: Tenderness present. Normal range of motion.     Cervical back: Neck supple.     Comments: She is tender at the base of her right thumb and through the metacarpal.  Other digits unaffected.  Minimal wrist discomfort.  Elbow and shoulder nontender.  Left upper extremity nontender.  Bilateral lower extremities nontender.  Cap refill brisk.  Skin:    General: Skin is warm and dry.     Capillary Refill: Capillary refill takes less than 2 seconds.  Neurological:     General: No focal deficit present.     Mental Status: She is alert.     GCS: GCS eye subscore is 4. GCS verbal subscore is 5. GCS motor subscore is 6.     ED Results / Procedures / Treatments   Labs (all labs ordered are listed, but only abnormal results are displayed) Labs Reviewed - No data to  display  EKG EKG Interpretation  Date/Time:  Saturday May 26 2019 17:44:23 EST Ventricular Rate:  80 PR Interval:    QRS  Duration: 93 QT Interval:  422 QTC Calculation: 487 R Axis:   97 Text Interpretation: Sinus rhythm Right axis deviation Low voltage, precordial leads Anteroseptal infarct, old Nonspecific T abnormalities, lateral leads No significant change since 12/20 Confirmed by Aletta Edouard [phone]) on 05/26/2019 5:52:03 PM   Radiology DG Wrist Complete Right  Result Date: 05/26/2019 CLINICAL DATA:  Fall, thumb pain EXAM: RIGHT HAND - COMPLETE 3+ VIEW; RIGHT WRIST - COMPLETE 3+ VIEW COMPARISON:  None. FINDINGS: Fracture fragments dorsal to the carpus on lateral view, characteristic in appearance for triquetral avulsion. No other fracture or dislocation of the right hand or wrist. Joint spaces are generally the well preserved. Extensive vascular calcinosis about the hand and wrist. IMPRESSION: Fracture fragments dorsal to the carpus characteristic for triquetral avulsion. No other fracture or dislocation of the right hand or wrist. Electronically Signed   By: Eddie Candle M.D.   On: 05/26/2019 18:15   DG Hand Complete Right  Result Date: 05/26/2019 CLINICAL DATA:  Fall, thumb pain EXAM: RIGHT HAND - COMPLETE 3+ VIEW; RIGHT WRIST - COMPLETE 3+ VIEW COMPARISON:  None. FINDINGS: Fracture fragments dorsal to the carpus on lateral view, characteristic in appearance for triquetral avulsion. No other fracture or dislocation of the right hand or wrist. Joint spaces are generally the well preserved. Extensive vascular calcinosis about the hand and wrist. IMPRESSION: Fracture fragments dorsal to the carpus characteristic for triquetral avulsion. No other fracture or dislocation of the right hand or wrist. Electronically Signed   By: Eddie Candle M.D.   On: 05/26/2019 18:15    Procedures Procedures (including critical care time)  Medications Ordered in ED Medications - No data to display  ED Course  I have reviewed the triage vital signs and the nursing notes.  Pertinent labs & imaging results that were available  during my care of the patient were reviewed by me and considered in my medical decision making (see chart for details).  Clinical Course as of May 25 2208  Sat May 26, 2019  1815 Differential includes fracture, dislocation, sprain, contusion.   [MB]  1816 X-rays interpreted by me as no fracture or dislocation noted.  Awaiting radiology reading.   [MB]  1572 Discussed with Dr. Jeannie Fend hand surgery who recommends patient going into a thumb spica either removable or Ortho-Glass and can see in the clinic in a week or 2.   [MB]    Clinical Course User Index [MB] Hayden Rasmussen, MD   MDM Rules/Calculators/A&P                       Final Clinical Impression(s) / ED Diagnoses Final diagnoses:  Fall, initial encounter  Sprain of metacarpophalangeal (MCP) joint of right thumb, initial encounter  Closed chip fracture of triquetrum of right wrist, initial encounter    Rx / DC Orders ED Discharge Orders    None       Hayden Rasmussen, MD 05/26/19 2211

## 2019-05-26 NOTE — ED Triage Notes (Addendum)
Pt arrived via [facility] from dialysis. Pt tripped in the parking lot before dialysis at noon today. Pt fell on her right side and is c/o of right thumb and wrist pain. Pt was able to complete her dialysis treatment but wanted to be checked out before returning home. No obvious deformity noted.

## 2019-06-11 ENCOUNTER — Other Ambulatory Visit: Payer: Self-pay | Admitting: *Deleted

## 2019-06-11 MED ORDER — ISOSORBIDE MONONITRATE ER 30 MG PO TB24: 30.0000 mg | ORAL_TABLET | Freq: Every day | ORAL | 2 refills | Status: DC

## 2019-07-09 ENCOUNTER — Other Ambulatory Visit: Payer: Self-pay | Admitting: Family Medicine

## 2019-07-09 DIAGNOSIS — E118 Type 2 diabetes mellitus with unspecified complications: Secondary | ICD-10-CM

## 2019-07-11 ENCOUNTER — Other Ambulatory Visit: Payer: Self-pay

## 2019-07-11 ENCOUNTER — Encounter: Payer: Self-pay | Admitting: Family Medicine

## 2019-07-11 ENCOUNTER — Ambulatory Visit (INDEPENDENT_AMBULATORY_CARE_PROVIDER_SITE_OTHER): Payer: Medicare Other | Admitting: Family Medicine

## 2019-07-11 VITALS — BP 142/80 | HR 80 | Ht 62.0 in | Wt 231.8 lb

## 2019-07-11 DIAGNOSIS — E118 Type 2 diabetes mellitus with unspecified complications: Secondary | ICD-10-CM | POA: Diagnosis not present

## 2019-07-11 DIAGNOSIS — E1140 Type 2 diabetes mellitus with diabetic neuropathy, unspecified: Secondary | ICD-10-CM

## 2019-07-11 DIAGNOSIS — N185 Chronic kidney disease, stage 5: Secondary | ICD-10-CM

## 2019-07-11 DIAGNOSIS — E1122 Type 2 diabetes mellitus with diabetic chronic kidney disease: Secondary | ICD-10-CM | POA: Diagnosis not present

## 2019-07-11 DIAGNOSIS — Z794 Long term (current) use of insulin: Secondary | ICD-10-CM

## 2019-07-11 LAB — POCT GLYCOSYLATED HEMOGLOBIN (HGB A1C): HbA1c, POC (controlled diabetic range): 7.6 % — AB (ref 0.0–7.0)

## 2019-07-11 MED ORDER — GABAPENTIN 400 MG PO CAPS: 400.0000 mg | ORAL_CAPSULE | Freq: Two times a day (BID) | ORAL | 0 refills | Status: DC

## 2019-07-11 NOTE — Patient Instructions (Signed)
It was great meeting you today! You hemoglobin a1c looks great, keep up all the good work. I will increase your gabapentin to 400mg . You can take this 2 times per day. If have any side effects please let me know and I can adjust the medication as needed. WE will see you back in around 3 months.

## 2019-07-12 ENCOUNTER — Encounter: Payer: Self-pay | Admitting: Family Medicine

## 2019-07-12 NOTE — Progress Notes (Signed)
   CHIEF COMPLAINT / HPI: 79 year old female who presents for neuropathic pain.  Of note patient has poorly controlled diabetes and is end-stage renal disease.  States that she has been doing much better from a eating and medication ministration standpoint. Takes lantus 44U, lispro 8U with dinner.   Patient states he has been having worsening neuropathic pain.  Patient has only been taking 200 mg of the Neurontin once daily as opposed to the prescribed 300 mg 2 times a day.  She has not tried nothing else for the pain.  The pain described as a burning sensation in her soles of her feet  PERTINENT  PMH / PSH:    OBJECTIVE: BP (!) 142/80   Pulse 80   Ht 5\' 2"  (1.575 m)   Wt 231 lb 12.8 oz (105.1 kg)   SpO2 99%   BMI 42.40 kg/m   Gen: 79 year old Afro-American female, no acute distress, resting comfortably CV: Regular rate rhythm, no M/R/G Resp: Lungs clear to auscultation bilaterally  Neuro: Alert and oriented, Speech clear, No gross deficits  Bilateral feet: Sensation intact all nerve distributions to microfilament testing.  Palpable PT/DP  ASSESSMENT / PLAN:  Type 2 diabetes mellitus with stage 5 chronic kidney disease not on chronic dialysis, with long-term current use of insulin (HCC) A1c improved to 7.6 from 9.2.  Continue Lantus 44 units, lispro 8 units.  Follow-up with PCP in 3 months for A1c check.  Painful diabetic neuropathy ([place]) Has not been getting good relief from 200 mg gabapentin.  Will titrate up to 400 mg twice daily.  Can follow-up as needed.  Given her ESRD will do twice daily dosing and be careful about the total amount.    Guadalupe Dawn MD PGY-3 Family Medicine Resident [place]

## 2019-07-12 NOTE — Assessment & Plan Note (Addendum)
Has not been getting good relief from 200 mg gabapentin.  Will titrate up to 400 mg twice daily.  Can follow-up as needed.  Given her ESRD will do twice daily dosing and be careful about the total amount.

## 2019-07-12 NOTE — Assessment & Plan Note (Signed)
A1c improved to 7.6 from 9.2.  Continue Lantus 44 units, lispro 8 units.  Follow-up with PCP in 3 months for A1c check.

## 2019-07-23 ENCOUNTER — Encounter (INDEPENDENT_AMBULATORY_CARE_PROVIDER_SITE_OTHER): Payer: Self-pay | Admitting: Ophthalmology

## 2019-07-23 ENCOUNTER — Ambulatory Visit (INDEPENDENT_AMBULATORY_CARE_PROVIDER_SITE_OTHER): Payer: Medicare Other | Admitting: Ophthalmology

## 2019-07-23 ENCOUNTER — Other Ambulatory Visit: Payer: Self-pay

## 2019-07-23 DIAGNOSIS — H43811 Vitreous degeneration, right eye: Secondary | ICD-10-CM | POA: Diagnosis not present

## 2019-07-23 DIAGNOSIS — H43812 Vitreous degeneration, left eye: Secondary | ICD-10-CM | POA: Diagnosis not present

## 2019-07-23 DIAGNOSIS — E113393 Type 2 diabetes mellitus with moderate nonproliferative diabetic retinopathy without macular edema, bilateral: Secondary | ICD-10-CM

## 2019-07-23 DIAGNOSIS — H2513 Age-related nuclear cataract, bilateral: Secondary | ICD-10-CM | POA: Diagnosis not present

## 2019-07-23 DIAGNOSIS — H25013 Cortical age-related cataract, bilateral: Secondary | ICD-10-CM

## 2019-10-19 ENCOUNTER — Encounter: Payer: Self-pay | Admitting: Podiatry

## 2019-10-19 ENCOUNTER — Other Ambulatory Visit: Payer: Self-pay

## 2019-10-19 ENCOUNTER — Ambulatory Visit: Payer: Medicare Other | Admitting: Podiatry

## 2019-10-19 DIAGNOSIS — M79609 Pain in unspecified limb: Secondary | ICD-10-CM | POA: Diagnosis not present

## 2019-10-19 DIAGNOSIS — E1151 Type 2 diabetes mellitus with diabetic peripheral angiopathy without gangrene: Secondary | ICD-10-CM | POA: Diagnosis not present

## 2019-10-19 DIAGNOSIS — B351 Tinea unguium: Secondary | ICD-10-CM | POA: Diagnosis not present

## 2019-10-19 DIAGNOSIS — Z794 Long term (current) use of insulin: Secondary | ICD-10-CM

## 2019-10-19 NOTE — Progress Notes (Signed)
This patient returns to my office for at risk foot care.  This patient requires this care by a professional since this patient will be at risk due to having chronic kidney disease and diabetes type 2.    This patient is unable to cut nails herself since the patient cannot reach her nails.These nails are painful walking and wearing shoes.  This patient presents for at risk foot care today.  Patient has not been seen in over 8 months.  General Appearance  Alert, conversant and in no acute stress.  Vascular  Dorsalis pedis and posterior tibial  pulses are not  palpable  bilaterally.  Capillary return is within normal limits  bilaterally. Temperature is within normal limits  Bilaterally.  Swelling bilaterally.  Neurologic  Senn-Weinstein monofilament wire test diminished  bilaterally. Muscle power within normal limits bilaterally.  Nails Thick disfigured discolored nails with subungual debris  from hallux to fifth toes bilaterally. No evidence of bacterial infection or drainage bilaterally.  Orthopedic  No limitations of motion  feet .  No crepitus or effusions noted.  No bony pathology or digital deformities noted. HAV  B/L.  Skin  normotropic skin with no porokeratosis noted bilaterally.  No signs of infections or ulcers noted.     Onychomycosis  Pain in right toes  Pain in left toes  Consent was obtained for treatment procedures.   Mechanical debridement of nails 1-5  bilaterally performed with a nail nipper.  Filed with dremel without incident.    Return office visit    10 weeks                  Told patient to return for periodic foot care and evaluation due to potential at risk complications.   Gardiner Barefoot DPM

## 2019-11-12 ENCOUNTER — Encounter: Payer: Self-pay | Admitting: Family Medicine

## 2019-11-12 ENCOUNTER — Other Ambulatory Visit: Payer: Self-pay

## 2019-11-12 ENCOUNTER — Ambulatory Visit (INDEPENDENT_AMBULATORY_CARE_PROVIDER_SITE_OTHER): Payer: Medicare Other | Admitting: Family Medicine

## 2019-11-12 VITALS — BP 134/78 | HR 84 | Ht 62.0 in | Wt 231.0 lb

## 2019-11-12 DIAGNOSIS — I5030 Unspecified diastolic (congestive) heart failure: Secondary | ICD-10-CM

## 2019-11-12 DIAGNOSIS — E118 Type 2 diabetes mellitus with unspecified complications: Secondary | ICD-10-CM | POA: Diagnosis not present

## 2019-11-12 DIAGNOSIS — E1122 Type 2 diabetes mellitus with diabetic chronic kidney disease: Secondary | ICD-10-CM

## 2019-11-12 DIAGNOSIS — R220 Localized swelling, mass and lump, head: Secondary | ICD-10-CM

## 2019-11-12 DIAGNOSIS — N186 End stage renal disease: Secondary | ICD-10-CM

## 2019-11-12 LAB — POCT GLYCOSYLATED HEMOGLOBIN (HGB A1C): HbA1c, POC (controlled diabetic range): 7.5 % — AB (ref 0.0–7.0)

## 2019-11-12 MED ORDER — OZEMPIC (0.25 OR 0.5 MG/DOSE) 2 MG/1.5ML ~~LOC~~ SOPN: 0.2500 mg | PEN_INJECTOR | SUBCUTANEOUS | 0 refills | Status: DC

## 2019-11-12 NOTE — Assessment & Plan Note (Signed)
Likely epidermoid cyst given appearance and firm, mobile texture without significant growth in size over time.  No systemic signs.  Less likely malignant or infectious process. - Patient declined referral to surgery given lack of symptoms - Instructed patient to return if notices getting larger or develops pain with mass

## 2019-11-12 NOTE — Patient Instructions (Addendum)
It was good to see you today.  Thank you for coming in.  I think you have an Epidermoid Cyst on your scalp.      I believe this to be benign, but we recommend referral to Surgery for Removal.  If it appears to keep getting biggerlet [place] know and come back and see [place].  Your A1C today is 7.5 which is very good.  We are also adding on Ozempic 0.25 injection to be taken weekly.  I do not believe you have any heart failure symptoms at this time.  I would like you to follow-up in 1 month to see how you are tolerating Ozempic.   Be Well, Dr Kathrin Ruddy

## 2019-11-12 NOTE — Progress Notes (Signed)
    SUBJECTIVE:   CHIEF COMPLAINT / HPI: Medication f/u, lump on head  Rachel Hobbs is a 79 yo female here for diabetes follow-up.  She is ucrrently on Lantus 44U daily and was previously taking Novolog 8U with meals but stopped taking due to cost.  She is currently tolerating medications well.  No heart palpitations, n/v/d, chest pain or difficulty breathing.  Her last A1C 4 months ago was 7.6 down from 9.2 1 year ago.  Patient also indicates she has a lump on her head.  Indicates she first noticed this many years ago when a piece of exercise equipment fell on head.  She believes it may have become slightly larger over time.  Denies any pain or itchiness.  Denies headaches or lightheadedness.  No n/v/d or fevers.  No fluid drainage.  Patient also mentioned it had been several years since she had seen cardiologist.  Currently takes Imdur. No new symptoms of chest pain, heart palpiations, difficulty breathing, lightheadedness or syncopal episodes.   PERTINENT  PMH / PSH: ESRD on Dialysis, HFpEF, Hypertension  OBJECTIVE:   BP (!) 134/78   Pulse 84   Ht 5\' 2"  (1.575 m)   Wt (!) 231 lb (104.8 kg)   SpO2 98%   BMI 42.25 kg/m    Physical Exam Constitutional:      General: She is not in acute distress.    Appearance: She is not toxic-appearing.  HENT:     Head: Normocephalic and atraumatic. Mass present. No contusion or laceration.     Comments: 2 cm firm, non-tender mobile mass on scalp with hair clearing (picture in chart) Cardiovascular:     Rate and Rhythm: Normal rate and regular rhythm.  Pulmonary:     Effort: Pulmonary effort is normal.     Breath sounds: Normal breath sounds.  Musculoskeletal:     Right lower leg: No edema.     Left lower leg: No edema.  Neurological:     General: No focal deficit present.     Mental Status: She is alert.     Comments: Ambulates with cane     ASSESSMENT/PLAN:   Type II diabetes mellitus with end-stage renal disease ([place]) Patient  A1C 7.5, in desired target range.  Is currtenly on Lantus 44U without issue or side effects.  ESRD Currently on Dialysis T, Th, S.  - Continue on current Lantus regimen - Prescribed Ozempic 0.25 mg injection once per week add-on for benefits including weight loss, discussed common GI side effects with patient, can stop taking if side effects intolerable or if experience heart palpitations or pre-syncope - F/u in 1 month    Head mass Likely epidermoid cyst given appearance and firm, mobile texture without significant growth in size over time.  No systemic signs.  Less likely malignant or infectious process. - Patient declined referral to surgery given lack of symptoms - Instructed patient to return if notices getting larger or develops pain with mass   Diastolic CHF [facility]) Patient inquired about referral.  Appears to have last seen Cardiologist in 2017.  No new dyspnea, cheat pain, or heart palpitations. - Will hold off on referal for now given lack of concerning symptoms, instucted patient to return if she has any of these     Delora Fuel, MD [place]

## 2019-11-12 NOTE — Assessment & Plan Note (Signed)
Patient A1C 7.5, in desired target range.  Is currtenly on Lantus 44U without issue or side effects.  ESRD Currently on Dialysis T, Th, S.  - Continue on current Lantus regimen - Prescribed Ozempic 0.25 mg injection once per week add-on for benefits including weight loss, discussed common GI side effects with patient, can stop taking if side effects intolerable or if experience heart palpitations or pre-syncope - F/u in 1 month

## 2019-11-12 NOTE — Assessment & Plan Note (Signed)
Patient inquired about referral.  Appears to have last seen Cardiologist in 2017.  No new dyspnea, cheat pain, or heart palpitations. - Will hold off on referal for now given lack of concerning symptoms, instucted patient to return if she has any of these

## 2019-11-23 ENCOUNTER — Other Ambulatory Visit (HOSPITAL_COMMUNITY): Payer: Self-pay | Admitting: Nephrology

## 2019-11-23 DIAGNOSIS — N186 End stage renal disease: Secondary | ICD-10-CM

## 2019-11-26 ENCOUNTER — Other Ambulatory Visit: Payer: Self-pay | Admitting: Radiology

## 2019-11-26 ENCOUNTER — Inpatient Hospital Stay (HOSPITAL_COMMUNITY): Admission: RE | Admit: 2019-11-26 | Payer: Medicare Other | Source: Ambulatory Visit

## 2020-01-04 ENCOUNTER — Ambulatory Visit: Payer: Medicare Other | Admitting: Podiatry

## 2020-01-22 ENCOUNTER — Encounter (INDEPENDENT_AMBULATORY_CARE_PROVIDER_SITE_OTHER): Payer: Medicare Other | Admitting: Ophthalmology

## 2020-02-22 ENCOUNTER — Ambulatory Visit: Payer: Medicare Other | Admitting: Podiatry

## 2020-03-05 ENCOUNTER — Other Ambulatory Visit: Payer: Self-pay

## 2020-03-05 ENCOUNTER — Ambulatory Visit (INDEPENDENT_AMBULATORY_CARE_PROVIDER_SITE_OTHER): Payer: Medicare Other | Admitting: Ophthalmology

## 2020-03-05 ENCOUNTER — Encounter (INDEPENDENT_AMBULATORY_CARE_PROVIDER_SITE_OTHER): Payer: Self-pay | Admitting: Ophthalmology

## 2020-03-05 ENCOUNTER — Encounter (INDEPENDENT_AMBULATORY_CARE_PROVIDER_SITE_OTHER): Payer: Medicare Other | Admitting: Ophthalmology

## 2020-03-05 DIAGNOSIS — H2513 Age-related nuclear cataract, bilateral: Secondary | ICD-10-CM | POA: Diagnosis not present

## 2020-03-05 DIAGNOSIS — E113393 Type 2 diabetes mellitus with moderate nonproliferative diabetic retinopathy without macular edema, bilateral: Secondary | ICD-10-CM

## 2020-03-05 NOTE — Patient Instructions (Signed)
Instructed the critical importance of continued blood sugar control monitoring is the best chance to slow progression of diabetic eye disease.

## 2020-03-05 NOTE — Assessment & Plan Note (Signed)
Cataract(s) account for the patient's complaint. I discussed the risks and benefits of cataract surgery. Options were explained to the patient. The patient understands that new glasses may not improve their vision and desires to have cataract surgery. I have recommended follow up with their general eye care doctor for evaluation and consideration of cataract extraction with new intraocular lens insertion.  OD with visual acuity impact and cataractous opacity that would improve dramatically should cataract replacement and intraocular lens insertion occur.  These findings were reviewed with the patient and I encouraged her to consider consider this in future months

## 2020-03-05 NOTE — Progress Notes (Signed)
03/05/2020     CHIEF COMPLAINT Patient presents for Retina Follow Up   HISTORY OF PRESENT ILLNESS: Rachel Hobbs is a 79 y.o. female who presents to the clinic today for:   HPI    Retina Follow Up    Patient presents with  Diabetic Retinopathy.  In both eyes.  This started 8 months ago.  Severity is mild.  Duration of 8 months.          Comments    8 MO F/U OU   Pt reports stable vision, no new F/F    Last A1C: Around 7.5 taken 12/2019    Last BS: 214 taken yesterday       Last edited by Nichola Sizer D on 03/05/2020  2:42 PM. (History)      Referring physician: Delora Fuel, MD [place],  [place] [address]  HISTORICAL INFORMATION:   Selected notes from the MEDICAL RECORD NUMBER    Lab Results  Component Value Date   HGBA1C 7.5 (A) 11/12/2019     CURRENT MEDICATIONS: No current outpatient medications on file. (Ophthalmic Drugs)   No current facility-administered medications for this visit. (Ophthalmic Drugs)   Current Outpatient Medications (Other)  Medication Sig  . acetaminophen (TYLENOL) 500 MG tablet Take 500 mg by mouth every 6 (six) hours as needed for mild pain or headache.  Marland Kitchen aspirin 81 MG tablet Take 81 mg by mouth daily.    . Aspirin-Calcium Carbonate 81-777 MG TABS Take by mouth.  Marland Kitchen atorvastatin (LIPITOR) 40 MG tablet TAKE 1 TABLET(40 MG) BY MOUTH DAILY AT 6 PM  . B Complex-C-Folic Acid (NEPHRO VITAMINS) 0.8 MG TABS Take 1 tablet by mouth daily.  . bisacodyl (BISACODYL) 5 MG EC tablet Take 5 mg by mouth daily as needed for moderate constipation.  . Blood Glucose Monitoring Suppl (BLOOD GLUCOSE METER) kit Use as instructed  . Blood Glucose Monitoring Suppl (ONETOUCH VERIO) w/Device KIT Use daily as indicated  . calcium acetate (PHOSLO) 667 MG capsule Take 667-1,334 mg by mouth See admin instructions. Take 1334 mg with each meal and 667 mg with each snack  . ethyl chloride spray APP TO ACCESS PRE-DIALYSIS UTD  . gabapentin  (NEURONTIN) 100 MG capsule Take 100 mg by mouth 2 (two) times daily.  Marland Kitchen glucose blood (ONETOUCH VERIO) test strip Use as instructed  . insulin lispro (HUMALOG KWIKPEN) 100 UNIT/ML KwikPen Inject 0.08 mLs (8 Units total) into the skin daily before supper.  . Insulin Pen Needle 31G X 8 MM MISC BD UltraFine III Pen Needles. For use with insulin pen device. Inject insulin 2X daily  . isosorbide mononitrate (IMDUR) 30 MG 24 hr tablet Take 1 tablet (30 mg total) by mouth daily.  . Lancets (ONETOUCH ULTRASOFT) lancets Once daily testing plus prn for hypoglycemia  . LANTUS SOLOSTAR 100 UNIT/ML Solostar Pen   . Multiple Vitamins-Minerals (QUIN B STRONG) TABS Take by mouth.  Marland Kitchen NEEDLE, DISP, 30 G (B-D DISP NEEDLE 30GX1") 30G X 1" MISC 1 each by Does not apply route daily.  . ONE TOUCH ULTRA TEST test strip TEST BLOOD SUGAR EVERY DAY AND AS NEEDED FOR SYMPTOMS OF HYPOGLYCEMIA  . ONETOUCH DELICA LANCETS 42V MISC Use daily as indicated  . Semaglutide,0.25 or 0.5MG/DOS, (OZEMPIC, 0.25 OR 0.5 MG/DOSE,) 2 MG/1.5ML SOPN Inject 0.1875 mLs (0.25 mg total) into the skin every 7 (seven) days.  . Skin Protectants, Misc. (EUCERIN) cream Apply topically as needed for dry skin.   No current  facility-administered medications for this visit. (Other)      REVIEW OF SYSTEMS:    ALLERGIES No Known Allergies  PAST MEDICAL HISTORY Past Medical History:  Diagnosis Date  . Anemia of renal disease 06/16/2006   Qualifier: Diagnosis of  By: Marinell Blight, Dawn    . Arthritis   . CHF (congestive heart failure) ([place])   . Depression   . Diabetes mellitus    type 2  . HAIR LOSS 08/17/2006   Qualifier: Diagnosis of  By: Girard Cooter MD, MAKEECHA    . Hernia   . Hyperlipidemia   . Hypertension   . Iron deficiency anemia   . Low iron   . Nodule of soft tissue 06/22/2012  . Peripheral vascular disease (HCC)    in legs  . Pneumonia    teenager  . Renal disorder    CKD - dialysis T/TH/Sa  . Shortness of breath  dyspnea    with exertion  . SMALL BOWEL OBSTRUCTION, HX OF 02/15/2007   Qualifier: Diagnosis of  By: [name], HEIDI     Past Surgical History:  Procedure Laterality Date  . ABDOMINAL HYSTERECTOMY     in the 70's  . AV FISTULA PLACEMENT Left 12/05/2015   Procedure: RADIOCEPHALIC VERSUS BRACHIOCEPHALIC ARTERIOVENOUS (AV) FISTULA CREATION;  Surgeon: [name], MD;  Location: [facility] OR;  Service: Vascular;  Laterality: Left;  . AV FISTULA PLACEMENT Left 05/20/2017   Procedure: INSERTION OF ARTERIOVENOUS (AV) GORE-TEX VASCULAR STRETCH 4-7 GRAFT ARM LEFT UPPER ARM;  Surgeon: [name], MD;  Location: [place];  Service: Vascular;  Laterality: Left;  . BASCILIC VEIN TRANSPOSITION Left 06/04/2016   Procedure: FIRST STAGE BASILIC VEIN TRANSPOSITION;  Surgeon: [name], MD;  Location: [place];  Service: Vascular;  Laterality: Left;  . BASCILIC VEIN TRANSPOSITION Left 08/04/2016   Procedure: LEFT UPPER ARM [place], SECOND STAGE;  Surgeon: [name], MD;  Location: [facility];  Service: Vascular;  Laterality: Left;  . HERNIA REPAIR     umbilical in the 67'M  . INSERTION OF DIALYSIS CATHETER Right 12/05/2015   Procedure: INSERTION OF DIALYSIS CATHETER;  Surgeon: [name], MD;  Location: [facility];  Service: Vascular;  Laterality: Right;  . IR THROMBECTOMY AV FISTULA W/THROMBOLYSIS/PTA INC/SHUNT/IMG LEFT Left 05/09/2019  . IR US GUIDE VASC ACCESS LEFT  05/09/2019    FAMILY HISTORY Family History  Problem Relation Age of Onset  . Kidney disease Mother   . Hypertension Mother   . COPD Father        smoke  . Cancer Father        Lung  . Hypertension Father   . Kidney disease Sister   . Diabetes Daughter     SOCIAL HISTORY Social History   Tobacco Use  . Smoking status: Never Smoker  . Smokeless tobacco: Never Used  Vaping Use  . Vaping Use: Never used  Substance Use Topics  . Alcohol use: No  . Drug use: No         OPHTHALMIC EXAM:  Base Eye Exam      Visual Acuity (ETDRS)      Right Left   Dist [place] 20/30 +1 20/30 +1   Dist ph [place] 20/25 -1 NI       Tonometry (Tonopen, 2:48 PM)      Right Left   Pressure 14 19       Pupils      Pupils Dark Light Shape  React APD   Right PERRL 3 2 Round Brisk None   Left PERRL 3 2 Round Brisk None       Visual Fields (Counting fingers)      Left Right    Full Full       Extraocular Movement      Right Left    Full Full       Neuro/Psych    Oriented x3: Yes   Mood/Affect: Normal       Dilation    Both eyes: 1.0% Mydriacyl, 2.5% Phenylephrine @ 2:50 PM        Slit Lamp and Fundus Exam    External Exam      Right Left   External Normal Normal       Slit Lamp Exam      Right Left   Lids/Lashes Normal Normal   Conjunctiva/Sclera White and quiet White and quiet   Cornea Clear Clear   Anterior Chamber Narrow angle,,at 1/2 ct depth Deep and quiet.. narrow, at 1/2 ac deptha   Iris Round and reactive Round and reactive   Lens 3+ Nuclear sclerosis, 3+ Cortical cataract 2+ Cortical cataract, 2+ Nuclear sclerosis   Anterior Vitreous Posterior vitreous detachment Normal       Fundus Exam      Right Left   Posterior Vitreous Posterior vitreous detachment Normal   Disc Normal Normal   C/D Ratio 0.3 0.25   Macula Normal Normal, no macular thickening, no clinically significant macular edema   Vessels Moderate NPDR Moderate NPDR   Periphery Normal Normal          IMAGING AND PROCEDURES  Imaging and Procedures for 03/05/20  OCT, Retina - OU - Both Eyes       Right Eye Quality was good. Scan locations included subfoveal. Central Foveal Thickness: 271. Progression has been stable. Findings include normal foveal contour.   Left Eye Quality was good. Scan locations included subfoveal. Central Foveal Thickness: 291. Progression has been stable. Findings include cystoid macular edema.   Notes Moderate nonproliferative diabetic retinopathy with Minor perifoveal leakage CME  superotemporal OS only   OD, no active maculopathy                ASSESSMENT/PLAN:  Nuclear sclerotic cataract of both eyes Cataract(s) account for the patient's complaint. I discussed the risks and benefits of cataract surgery. Options were explained to the patient. The patient understands that new glasses may not improve their vision and desires to have cataract surgery. I have recommended follow up with their general eye care doctor for evaluation and consideration of cataract extraction with new intraocular lens insertion.  OD with visual acuity impact and cataractous opacity that would improve dramatically should cataract replacement and intraocular lens insertion occur.  These findings were reviewed with the patient and I encouraged her to consider consider this in future months      ICD-10-CM   1. Moderate nonproliferative diabetic retinopathy of both eyes without macular edema associated with type 2 diabetes mellitus (HCC)  J67.3419 OCT, Retina - OU - Both Eyes  2. Nuclear sclerotic cataract of both eyes  H25.13     1. Continue excellent blood sugar control attempts.  2. Patient encouraged to monitor visual acuity monocular Lee to monitor for decline in acuity from cataract particularly OD  3.  Ophthalmic Meds Ordered this visit:  No orders of the defined types were placed in this encounter.      Return in about 6 months (  around 09/02/2020) for DILATE OU, OCT.  Patient Instructions  Instructed the critical importance of continued blood sugar control monitoring is the best chance to slow progression of diabetic eye disease.    Explained the diagnoses, plan, and follow up with the patient and they expressed understanding.  Patient expressed understanding of the importance of proper follow up care.   Clent Demark Selenia Mihok M.D. Diseases & Surgery of the Retina and Vitreous Retina & Diabetic [place] 03/05/20     Abbreviations: M myopia (nearsighted); A  astigmatism; H hyperopia (farsighted); P presbyopia; Mrx spectacle prescription;  CTL contact lenses; OD right eye; OS left eye; OU both eyes  XT exotropia; ET esotropia; PEK punctate epithelial keratitis; PEE punctate epithelial erosions; DES dry eye syndrome; MGD meibomian gland dysfunction; ATs artificial tears; PFAT's preservative free artificial tears; [place] nuclear sclerotic cataract; PSC posterior subcapsular cataract; ERM epi-retinal membrane; PVD posterior vitreous detachment; RD retinal detachment; DM diabetes mellitus; DR diabetic retinopathy; NPDR non-proliferative diabetic retinopathy; PDR proliferative diabetic retinopathy; CSME clinically significant macular edema; DME diabetic macular edema; dbh dot blot hemorrhages; CWS cotton wool spot; POAG primary open angle glaucoma; C/D cup-to-disc ratio; HVF humphrey visual field; GVF goldmann visual field; OCT optical coherence tomography; IOP intraocular pressure; BRVO Branch retinal vein occlusion; CRVO central retinal vein occlusion; CRAO central retinal artery occlusion; BRAO branch retinal artery occlusion; RT retinal tear; SB scleral buckle; PPV pars plana vitrectomy; VH Vitreous hemorrhage; PRP panretinal laser photocoagulation; IVK intravitreal kenalog; VMT vitreomacular traction; MH Macular hole;  NVD neovascularization of the disc; NVE neovascularization elsewhere; AREDS age related eye disease study; ARMD age related macular degeneration; POAG primary open angle glaucoma; EBMD epithelial/anterior basement membrane dystrophy; ACIOL anterior chamber intraocular lens; IOL intraocular lens; PCIOL posterior chamber intraocular lens; Phaco/IOL phacoemulsification with intraocular lens placement; [place] photorefractive keratectomy; LASIK laser assisted in situ keratomileusis; HTN hypertension; DM diabetes mellitus; COPD chronic obstructive pulmonary disease

## 2020-03-07 ENCOUNTER — Other Ambulatory Visit: Payer: Self-pay

## 2020-03-07 ENCOUNTER — Ambulatory Visit (INDEPENDENT_AMBULATORY_CARE_PROVIDER_SITE_OTHER): Payer: Medicare Other | Admitting: Podiatry

## 2020-03-07 DIAGNOSIS — B351 Tinea unguium: Secondary | ICD-10-CM

## 2020-03-07 DIAGNOSIS — Z794 Long term (current) use of insulin: Secondary | ICD-10-CM | POA: Diagnosis not present

## 2020-03-07 DIAGNOSIS — M79609 Pain in unspecified limb: Secondary | ICD-10-CM

## 2020-03-07 DIAGNOSIS — E0843 Diabetes mellitus due to underlying condition with diabetic autonomic (poly)neuropathy: Secondary | ICD-10-CM

## 2020-03-07 DIAGNOSIS — E1151 Type 2 diabetes mellitus with diabetic peripheral angiopathy without gangrene: Secondary | ICD-10-CM | POA: Diagnosis not present

## 2020-03-07 NOTE — Progress Notes (Signed)
  Subjective:  Patient ID: Rachel Hobbs, female    DOB: January 20, 1941,  MRN: [id number]  Chief Complaint  Patient presents with  . Nail Problem    thick painful toenails, 4 month follow up  . Diabetes    A1C 7.5, diabetic foot exam  . Peripheral Neuropathy    79 y.o. female presents with the above complaint. History confirmed with patient.  She usually sees Dr. Prudence Davidson.  No new issues.  Objective:  Physical Exam: warm, good capillary refill, no trophic changes or ulcerative lesions, weakly palpable DP and PT pulses and onychomycosis x10.  Absent protective sensation..  Assessment:  No diagnosis found.   Plan:  Patient was evaluated and treated and all questions answered.  Patient educated on diabetes. Discussed proper diabetic foot care and discussed risks and complications of disease. Educated patient in depth on reasons to return to the office immediately should he/she discover anything concerning or new on the feet. All questions answered. Discussed proper shoes as well.   Discussed the etiology and treatment options for the condition in detail with the patient. Educated patient on the topical and oral treatment options for mycotic nails. Recommended debridement of the nails today. Sharp and mechanical debridement performed of all painful and mycotic nails today. Nails debrided in length and thickness using a nail nipper and a mechanical burr to level of comfort. Discussed treatment options including appropriate shoe gear. Follow up as needed for painful nails.    Return in about 3 months (around 06/07/2020) for diabetic nail trim.

## 2020-03-10 ENCOUNTER — Encounter: Payer: Self-pay | Admitting: Podiatry

## 2020-03-14 ENCOUNTER — Other Ambulatory Visit: Payer: Self-pay | Admitting: Family Medicine

## 2020-03-19 ENCOUNTER — Ambulatory Visit: Payer: Medicare Other | Admitting: Podiatry

## 2020-03-19 ENCOUNTER — Other Ambulatory Visit: Payer: Self-pay

## 2020-03-20 NOTE — Telephone Encounter (Signed)
Patient returns call to nurse line regarding rx refill request.   Talbot Grumbling, RN

## 2020-03-21 NOTE — Telephone Encounter (Signed)
Patient calling again to check status. Christen Bame, CMA

## 2020-03-25 MED ORDER — LANTUS SOLOSTAR 100 UNIT/ML ~~LOC~~ SOPN
44.0000 [IU] | PEN_INJECTOR | Freq: Every day | SUBCUTANEOUS | 3 refills | Status: DC
Start: 2020-03-25 — End: 2020-10-08

## 2020-03-31 ENCOUNTER — Telehealth: Payer: Self-pay | Admitting: *Deleted

## 2020-03-31 ENCOUNTER — Encounter: Payer: Self-pay | Admitting: Family Medicine

## 2020-03-31 ENCOUNTER — Ambulatory Visit (INDEPENDENT_AMBULATORY_CARE_PROVIDER_SITE_OTHER): Payer: Medicare Other | Admitting: Family Medicine

## 2020-03-31 ENCOUNTER — Other Ambulatory Visit: Payer: Self-pay

## 2020-03-31 VITALS — BP 140/72 | HR 82 | Ht 62.0 in | Wt 231.4 lb

## 2020-03-31 DIAGNOSIS — E118 Type 2 diabetes mellitus with unspecified complications: Secondary | ICD-10-CM

## 2020-03-31 DIAGNOSIS — E1122 Type 2 diabetes mellitus with diabetic chronic kidney disease: Secondary | ICD-10-CM

## 2020-03-31 DIAGNOSIS — R220 Localized swelling, mass and lump, head: Secondary | ICD-10-CM

## 2020-03-31 DIAGNOSIS — L918 Other hypertrophic disorders of the skin: Secondary | ICD-10-CM

## 2020-03-31 DIAGNOSIS — N186 End stage renal disease: Secondary | ICD-10-CM

## 2020-03-31 LAB — POCT GLYCOSYLATED HEMOGLOBIN (HGB A1C): Hemoglobin A1C: 7.9 % — AB (ref 4.0–5.6)

## 2020-03-31 MED ORDER — GABAPENTIN 100 MG PO CAPS
100.0000 mg | ORAL_CAPSULE | Freq: Three times a day (TID) | ORAL | 3 refills | Status: DC
Start: 2020-03-31 — End: 2021-12-25

## 2020-03-31 NOTE — Assessment & Plan Note (Signed)
Likely given appearance.  Patient indicates wish to have remoeved if can b done in clinic.  Unable to go to derm clinic given T,Th, S dialysis.   - f/u in clinic opening with me for removal

## 2020-03-31 NOTE — Telephone Encounter (Signed)
Sending this as a reminder to check on bringing pt in to remove skin tags. Syed Zukas Zimmerman Rumple, CMA

## 2020-03-31 NOTE — Assessment & Plan Note (Signed)
A1C increased to 7.9 from 7.4 4 months ago.  Will continue to monitor without making change to regimen of 44U Lantus and 0.25 mg Semaglutide at this time.   - Continue current regimen - f/u in 3 months for repeat A1C

## 2020-03-31 NOTE — Progress Notes (Signed)
    SUBJECTIVE:   CHIEF COMPLAINT / HPI: Diabetes f/u  Rachel Hobbs is here for A1C follow-up.  Indicates she is compliant with medicine but has not had best diet recently with holidays.  Indicates she is also takes gabapentin for neuropathy in feet.  Was recently seen by podiatrist who indicated feet were well perfused and looked well, other than chronic nail issues.    Also here for follow-up of lump on her head and new skin lesion by eye.  Niether are painful and feels the lump on top of hread has not been getting larger.  Declined referral to surgery before and still expresses desire for conservative management if not changing.    PERTINENT  PMH / PSH:   OBJECTIVE:   BP 140/72   Pulse 82   Ht 5\' 2"  (1.575 m)   Wt 231 lb 6.4 oz (105 kg)   SpO2 100%   BMI 42.32 kg/m    Physical Exam Constitutional:      Appearance: Normal appearance.  HENT:     Head: Atraumatic. Mass present. No laceration.     Comments: 2 cm firm, non-tender mobile mass on scalp with hair clearing (picture in chart) Also 0.4 mm non-tender flesh colored pedunculated stalk lateral to left eye (picture in chart)    Mouth/Throat:     Mouth: Mucous membranes are moist.  Cardiovascular:     Rate and Rhythm: Normal rate and regular rhythm.  Pulmonary:     Effort: Pulmonary effort is normal.     Breath sounds: Normal breath sounds.  Skin:    General: Skin is warm.  Neurological:     Mental Status: She is alert.     ASSESSMENT/PLAN:   Head mass Likely epidermoid cyst given appearance and firm, mobile texture without significant growth in size since last seen in July 4 months ago.  No systemic signs.  Less likely malignant or infectious process. - Patient declined referral to surgery given lack of symptoms - Instructed patient to return if notices getting larger or develops pain with mass  Acrochordon Likely given appearance.  Patient indicates wish to have remoeved if can b done in clinic.  Unable to go  to derm clinic given T,Th, S dialysis.   - f/u in clinic opening with me for removal  Type II diabetes mellitus with end-stage renal disease ([place]) A1C increased to 7.9 from 7.4 4 months ago.  Will continue to monitor without making change to regimen of 44U Lantus and 0.25 mg Semaglutide at this time.   - Continue current regimen - f/u in 3 months for repeat N4B    Diastolic CHF ([place]) Last seen Cardiologist in 2017.  Reports no current concerning symptoms. - Can discuss referral at next visit  Delora Fuel, MD [place]

## 2020-03-31 NOTE — Assessment & Plan Note (Signed)
Likely epidermoid cyst given appearance and firm, mobile texture without significant growth in size since last seen in July 4 months ago.  No systemic signs.  Less likely malignant or infectious process. - Patient declined referral to surgery given lack of symptoms - Instructed patient to return if notices getting larger or develops pain with mass

## 2020-03-31 NOTE — Patient Instructions (Addendum)
It was good to see you today.  Thank you for coming in.  I am increasing your Gabapentin.  Please continue to take 1 tablet in the morning and 2 at night.  Your A1C was 6.9 today.  Continue to take your diabetes medicines.  Please come to Derm clinic to have the place on your eye removed.  Otherwise I would like to follow-up on your diabetes in 3 months.  Be Well, Dr Manus Rudd

## 2020-04-16 ENCOUNTER — Ambulatory Visit (INDEPENDENT_AMBULATORY_CARE_PROVIDER_SITE_OTHER): Payer: Medicare Other

## 2020-04-16 DIAGNOSIS — Z0000 Encounter for general adult medical examination without abnormal findings: Secondary | ICD-10-CM

## 2020-04-16 NOTE — Patient Instructions (Addendum)
You spoke to Rachel Hobbs, [place] over the phone for your annual wellness visit.  We discussed goals: Goals    . HEMOGLOBIN A1C < 7     7.9 as of 03/2020. Discussed diet for 06/2020 follow-up.       We also discussed recommended health maintenance. As discussed, you are due for below. You can discuss PNA and Tdap vaccines at next PCP apt. You are due for follow-up March 2022.  Health Maintenance  Topic Date Due  . Hepatitis C Screening  Never done  . DEXA SCAN  Never done  . PNA vac Low Risk Adult (1 of 2 - PCV13) Never done  . TETANUS/TDAP  01/23/2015  . OPHTHALMOLOGY EXAM  12/11/2019  . FOOT EXAM  02/14/2020  . COVID-19 Vaccine (4 - Booster) 07/24/2020  . HEMOGLOBIN A1C  09/29/2020  . INFLUENZA VACCINE  Completed   You declined an advance directive today.  If you reconsider you can pick one up at our office.  Call to schedule your PCP apt for March 2022. Happy Holidays!   Diet Recommendations for Diabetes   1. Eat at least 3 meals and 1-2 snacks per day. Never go more than 4-5 hours while awake without eating. Eat breakfast within the first hour of getting up.   2. Limit starchy foods to TWO per meal and ONE per snack. ONE portion of a starchy  food is equal to the following:   - ONE slice of bread (or its equivalent, such as half of a hamburger bun).   - 1/2 cup of a "scoopable" starchy food such as potatoes or rice.   - 15 grams of Total Carbohydrate as shown on food label.  3. Include at every meal: a protein food, a carb food, and vegetables and/or fruit.   - Obtain twice the volume of vegetables as protein or carbohydrate foods for both lunch and dinner.   - Fresh or frozen vegetables are best.   - Keep frozen vegetables on hand for a quick vegetable serving.       Starchy (carb) foods: Bread, rice, pasta, potatoes, corn, cereal, grits, crackers, bagels, muffins, all baked goods.  (Fruits, milk, and yogurt also have carbohydrate, but most of these foods will not spike  your blood sugar as most starchy foods will.)  A few fruits do cause high blood sugars; use small portions of bananas (limit to 1/2 at a time), grapes, watermelon, oranges, and most tropical fruits.    Protein foods: Meat, fish, poultry, eggs, dairy foods, and beans such as pinto and kidney beans (beans also provide carbohydrate).    Preventive Care 26 Years and Older, Female Preventive care refers to lifestyle choices and visits with your health care provider that can promote health and wellness. This includes:  A yearly physical exam. This is also called an annual well check.  Regular dental and eye exams.  Immunizations.  Screening for certain conditions.  Healthy lifestyle choices, such as diet and exercise. What can I expect for my preventive care visit? Physical exam Your health care provider will check:  Height and weight. These may be used to calculate body mass index (BMI), which is a measurement that tells if you are at a healthy weight.  Heart rate and blood pressure.  Your skin for abnormal spots. Counseling Your health care provider may ask you questions about:  Alcohol, tobacco, and drug use.  Emotional well-being.  Home and relationship well-being.  Sexual activity.  Eating habits.  History  of falls.  Memory and ability to understand (cognition).  Work and work Statistician.  Pregnancy and menstrual history. What immunizations do I need?  Influenza (flu) vaccine  This is recommended every year. Tetanus, diphtheria, and pertussis (Tdap) vaccine  You may need a Td booster every 10 years. Varicella (chickenpox) vaccine  You may need this vaccine if you have not already been vaccinated. Zoster (shingles) vaccine  You may need this after age 53. Pneumococcal conjugate (PCV13) vaccine  One dose is recommended after age 18. Pneumococcal polysaccharide (PPSV23) vaccine  One dose is recommended after age 31. Measles, mumps, and rubella (MMR)  vaccine  You may need at least one dose of MMR if you were born in 1957 or later. You may also need a second dose. Meningococcal conjugate (MenACWY) vaccine  You may need this if you have certain conditions. Hepatitis A vaccine  You may need this if you have certain conditions or if you travel or work in places where you may be exposed to hepatitis A. Hepatitis B vaccine  You may need this if you have certain conditions or if you travel or work in places where you may be exposed to hepatitis B. Haemophilus influenzae type b (Hib) vaccine  You may need this if you have certain conditions. You may receive vaccines as individual doses or as more than one vaccine together in one shot (combination vaccines). Talk with your health care provider about the risks and benefits of combination vaccines. What tests do I need? Blood tests  Lipid and cholesterol levels. These may be checked every 5 years, or more frequently depending on your overall health.  Hepatitis C test.  Hepatitis B test. Screening  Lung cancer screening. You may have this screening every year starting at age 7 if you have a 30-pack-year history of smoking and currently smoke or have quit within the past 15 years.  Colorectal cancer screening. All adults should have this screening starting at age 10 and continuing until age 62. Your health care provider may recommend screening at age 29 if you are at increased risk. You will have tests every 1-10 years, depending on your results and the type of screening test.  Diabetes screening. This is done by checking your blood sugar (glucose) after you have not eaten for a while (fasting). You may have this done every 1-3 years.  Mammogram. This may be done every 1-2 years. Talk with your health care provider about how often you should have regular mammograms.  BRCA-related cancer screening. This may be done if you have a family history of breast, ovarian, tubal, or peritoneal  cancers. Other tests  Sexually transmitted disease (STD) testing.  Bone density scan. This is done to screen for osteoporosis. You may have this done starting at age 21. Follow these instructions at home: Eating and drinking  Eat a diet that includes fresh fruits and vegetables, whole grains, lean protein, and low-fat dairy products. Limit your intake of foods with high amounts of sugar, saturated fats, and salt.  Take vitamin and mineral supplements as recommended by your health care provider.  Do not drink alcohol if your health care provider tells you not to drink.  If you drink alcohol: ? Limit how much you have to 0-1 drink a day. ? Be aware of how much alcohol is in your drink. In the [place] one drink equals one 12 oz bottle of beer (355 mL), one 5 oz glass of wine (148 mL), or one 1 oz glass  of hard liquor (44 mL). Lifestyle  Take daily care of your teeth and gums.  Stay active. Exercise for at least 30 minutes on 5 or more days each week.  Do not use any products that contain nicotine or tobacco, such as cigarettes, e-cigarettes, and chewing tobacco. If you need help quitting, ask your health care provider.  If you are sexually active, practice safe sex. Use a condom or other form of protection in order to prevent STIs (sexually transmitted infections).  Talk with your health care provider about taking a low-dose aspirin or statin. What's next?  Go to your health care provider once a year for a well check visit.  Ask your health care provider how often you should have your eyes and teeth checked.  Stay up to date on all vaccines. This information is not intended to replace advice given to you by your health care provider. Make sure you discuss any questions you have with your health care provider. Document Revised: 03/30/2018 Document Reviewed: 03/30/2018 Elsevier Patient Education  2020 [facility]'s number is [phone]. Please call with questions or  concerns about what we discussed today.

## 2020-04-16 NOTE — Progress Notes (Signed)
Subjective:   Rachel Hobbs is a 79 y.o. female who presents for Medicare Annual (Subsequent) preventive examination.  Patient consented to have virtual visit and was identified by name and date of birth. Method of visit: Telephone  Encounter participants: Patient: Rachel Hobbs - located at Home Nurse/Provider: [name] - located at [facility] Others (if applicable): NA  Review of Systems: Defer to PCP.  Cardiac Risk Factors include: advanced age (>30mn, >>77women);diabetes mellitus;hypertension  Objective:   Vitals: There were no vitals taken for this visit.  There is no height or weight on file to calculate BMI.  Advanced Directives 04/16/2020 11/12/2019 05/09/2019 04/17/2019 05/03/2018 02/24/2018 09/23/2017  Does Patient Have a Medical Advance Directive? _0  No No  Would patient like information on creating a medical advance directive? No - Patient declined No - Patient declined No - Patient declined No - Patient declined No - Patient declined No - Patient declined No - Patient declined  Pre-existing out of facility DNR order (yellow form or pink MOST form) - - - - - - -   Tobacco Social History   Tobacco Use  Smoking Status Never Smoker  Smokeless Tobacco Never Used     Clinical Intake:  Pre-visit preparation completed: Yes  How often do you need to have someone help you when you read instructions, pamphlets, or other written materials from your doctor or pharmacy?: 2 - Rarely What is the last grade level you completed in school?: high school  Interpreter Needed?: No  Past Medical History:  Diagnosis Date   Anemia of renal disease 06/16/2006   Qualifier: Diagnosis of  By: PMarinell Blight Dawn     Arthritis    [place]ive [place]    Depression    Diabetes mellitus    type 2   HAIR LOSS 08/17/2006   Qualifier: Diagnosis of  By: RGirard CooterMD, MAKEECHA     Hernia    Hyperlipidemia    Hypertension    Iron deficiency anemia     Low iron    Nodule of soft tissue 06/22/2012   Peripheral vascular disease ([place]    in legs   Pneumonia    teenager   Renal disorder    CKD - dialysis T/TH/Sa   Shortness of breath dyspnea    with exertion   SMALL BOWEL OBSTRUCTION, HX OF 02/15/2007   Qualifier: Diagnosis of  By: GHoy MornMD, HEIDI     Past Surgical History:  Procedure Laterality Date   ABDOMINAL HYSTERECTOMY     in the 748's  AV FISTULA PLACEMENT Left 12/05/2015   Procedure: RADIOCEPHALIC VERSUS BRACHIOCEPHALIC ARTERIOVENOUS (AV) FISTULA CREATION;  Surgeon: CElam Dutch MD;  Location: [facility];  Service: Vascular;  Laterality: Left;   AV FISTULA PLACEMENT Left 05/20/2017   Procedure: INSERTION OF ARTERIOVENOUS (AV) GORE-TEX VASCULAR STRETCH 4-7 GRAFT ARM LEFT UPPER ARM;  Surgeon: ERosetta Posner MD;  [facility]  Service: Vascular;  Laterality: Left;   [place] 06/[place]   Procedure: FIRST STAGE BASILIC VEIN TRANSPOSITION;  Surgeon: VSerafina Mitchell MD;  [place]  Service: Vascular;  Laterality: Left;   [place] 08/[place]   Procedure: LEFT [place]PPER ARM [place] SECOND STAGE;  Surgeon: VSerafina Mitchell MD;  [facility]: [facility];  Service: Vascular;  Laterality: Left;   HERNIA REPAIR     umbilical in the 786'H  INSERTION OF  DIALYSIS CATHETER Right 12/05/2015   Procedure: INSERTION OF DIALYSIS CATHETER;  Surgeon: [name], MD;  Location[facility];  Service: Vascular;  Laterality: Right;   IR THROMBECTOMY AV FISTULA W/THROMBOLYSIS/PTA INC/SHUNT/IMG LEFT Left 05/09/2019   IR US GUIDE VASC ACCESS LEFT  05/09/2019   Family History  Problem Relation Age of Onset   Kidney disease Mother    Hypertension Mother    COPD Father        smoke   Cancer Father        Lung   Hypertension Father    Kidney disease Sister    Diabetes Daughter    Social History   Socioeconomic History   Marital status: Divorced    Spouse name:  Not on file   Number of children: 1   Years of education: 12   Highest education level: 12th grade  Occupational History   Occupation: Retired- [employer]     Employer: RETIRED  Tobacco Use   Smoking status: Never Smoker   Smokeless tobacco: Never Used  Scientific laboratory technician Use: Never used  Substance and Sexual Activity   Alcohol use: No   Drug use: No   Sexual activity: Not Currently  Other Topics Concern   Not on file  Social History Narrative   Patient lives with daughter [name])   Important Relationships Daughter   Education / Work:  18 th grade/ 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)    Interests / Fun: Sings in choir at [place], going to [employer], and going to grandkids' games and school events    Current Stressors: Denies   Religious / Personal Beliefs: "I believe in God."         *Updated 03/2020*                                                                                                          Social Determinants of Health   Financial Resource Strain: Low Risk    Difficulty of Paying Living Expenses: Not hard at all  Food Insecurity: No Food Insecurity   Worried About Charity fundraiser in the Last Year: Never true   Arboriculturist in the Last Year: Never true  Transportation Needs: No Transportation Needs   Lack of Transportation (Medical): No   Lack of Transportation (Non-Medical): No  Physical Activity: Inactive   Days of Exercise per Week: 0 days   Minutes of Exercise per Session: 0 min  Stress: No Stress Concern Present   Feeling of Stress : Not at all  Social Connections: Moderately Integrated   Frequency of Communication with Friends and Family: More than three times a week   Frequency of Social Gatherings with Friends and Family: More than three times a week   Attends Religious Services: More than 4 times per year   Active Member of [employer] or Organizations: Yes   Attends Music therapist: More  than 4 times per year   Marital Status: Divorced   Outpatient Encounter Medications as of  04/16/2020  Medication Sig   acetaminophen (TYLENOL) 500 MG tablet Take 500 mg by mouth every 6 (six) hours as needed for mild pain or headache.   aspirin 81 MG tablet Take 81 mg by mouth daily.   Aspirin-Calcium Carbonate 81-777 MG TABS Take by mouth.   atorvastatin (LIPITOR) 40 MG tablet TAKE 1 TABLET(40 MG) BY MOUTH DAILY AT 6 PM   B Complex-C-Folic Acid (NEPHRO VITAMINS) 0.8 MG TABS Take 1 tablet by mouth daily.   bisacodyl (DULCOLAX) 5 MG EC tablet Take 5 mg by mouth daily as needed for moderate constipation.   Blood Glucose Monitoring Suppl (BLOOD GLUCOSE METER) kit Use as instructed   Blood Glucose Monitoring Suppl (ONETOUCH VERIO) w/Device KIT Use daily as indicated   calcium acetate (PHOSLO) 667 MG capsule Take 667-1,334 mg by mouth See admin instructions. Take 1334 mg with each meal and 667 mg with each snack   gabapentin (NEURONTIN) 100 MG capsule Take 1 capsule (100 mg total) by mouth 3 (three) times daily. Take 100 mg in morning, and 200 mg at night   glucose blood (ONETOUCH VERIO) test strip Use as instructed   Insulin Pen Needle 31G X 8 MM MISC BD UltraFine III Pen Needles. For use with insulin pen device. Inject insulin 2X daily   isosorbide mononitrate (IMDUR) 30 MG 24 hr tablet TAKE 1 TABLET(30 MG) BY MOUTH DAILY   Lancets (ONETOUCH ULTRASOFT) lancets Once daily testing plus prn for hypoglycemia   LANTUS SOLOSTAR 100 UNIT/ML Solostar Pen Inject 44 Units into the skin at bedtime.   Methoxy PEG-Epoetin Beta (MIRCERA IJ) Mircera   Multiple Vitamins-Minerals (QUIN B STRONG) TABS Take by mouth.   NEEDLE, DISP, 30 G (B-D DISP NEEDLE 30GX1") 30G X 1" MISC 1 each by Does not apply route daily.   ONE TOUCH ULTRA TEST test strip TEST BLOOD SUGAR EVERY DAY AND AS NEEDED FOR SYMPTOMS OF HYPOGLYCEMIA   ONETOUCH DELICA LANCETS 96E MISC Use daily as indicated   doxycycline  (VIBRAMYCIN) 100 MG capsule Take 100 mg by mouth 2 (two) times daily. (Patient not taking: Reported on 04/16/2020)   ethyl chloride spray APP TO ACCESS PRE-DIALYSIS UTD (Patient not taking: Reported on 04/16/2020)   insulin lispro (HUMALOG KWIKPEN) 100 UNIT/ML KwikPen Inject 0.08 mLs (8 Units total) into the skin daily before supper. (Patient not taking: Reported on 04/16/2020)   Semaglutide,0.25 or 0.5MG/DOS, (OZEMPIC, 0.25 OR 0.5 MG/DOSE,) 2 MG/1.5ML SOPN Inject 0.1875 mLs (0.25 mg total) into the skin every 7 (seven) days. (Patient not taking: Reported on 04/16/2020)   Skin Protectants, Misc. (EUCERIN) cream Apply topically as needed for dry skin. (Patient not taking: Reported on 04/16/2020)   triamcinolone (KENALOG) 0.025 % cream Apply topically. (Patient not taking: Reported on 04/16/2020)   [DISCONTINUED] atorvastatin (LIPITOR) 40 MG tablet Take 1 tablet (40 mg total) by mouth daily at 6 PM.   No facility-administered encounter medications on file as of 04/16/2020.   Activities of Daily Living In your present state of health, do you have any difficulty performing the following activities: 04/16/2020  Hearing? Y  Vision? Y  Difficulty concentrating or making decisions? N  Walking or climbing stairs? N  Dressing or bathing? N  Doing errands, shopping? N  Preparing Food and eating ? N  Using the Toilet? N  In the past six months, have you accidently leaked urine? N  Do you have problems with loss of bowel control? N  Managing your Medications? N  Managing your Finances? N  Housekeeping or managing your Housekeeping? N  Some recent data might be hidden   Patient Care Team: Delora Fuel, MD as PCP - General (Family Medicine) Corliss Parish, MD (Nephrology) Ralene Bathe, MD (Ophthalmology) Bensimhon, Shaune Pascal, MD (Cardiology) Center, [facility] Barefoot, DPM as Consulting Physician (Podiatry)    Assessment:  This is a routine wellness examination  for Lari.  Exercise Activities and Dietary recommendations Current Exercise Habits: The patient does not participate in regular exercise at present, Exercise limited by: cardiac condition(s)  Goals     HEMOGLOBIN A1C < 7     7.9 as of 03/2020. Discussed diet for 06/2020 follow-up.       Fall Risk Fall Risk  04/16/2020 11/12/2019 11/03/2018 05/03/2018 02/24/2018  Falls in the past year? 0 0 0 0 0  Number falls in past yr: - 0 - - -  Injury with Fall? - 0 - - -  Risk for fall due to : - - - Impaired mobility -   Is the patient's home free of loose throw rugs in walkways, pet beds, electrical cords, etc?   yes      Grab bars in the bathroom? yes      Handrails on the stairs?   yes      Adequate lighting?   yes  Patient rating of health (0-10) scale: 7   Depression Screen PHQ 2/9 Scores 04/16/2020 04/16/2020 11/12/2019 11/03/2018  PHQ - 2 Score 0 0 0 0  PHQ- 9 Score - - 2 -    Cognitive Function MMSE - Mini Mental State Exam 05/03/2018 06/27/2013 10/15/2010  Orientation to time _0 Orientation to Place _1 Registration _2 Attention/ Calculation _3 Recall _4 Language- name 2 objects _5 Language- repeat _6 Language- follow 3 step command _7 Language- read & follow direction _8 Write a sentence _9 Copy design _10 Total score _11 6CIT Screen 04/16/2020 05/03/2018  What Year? 0 points 0 points  What month? 0 points 0 points  What time? 0 points 0 points  Count back from 20 0 points 0 points  Months in reverse 0 points 0 points  Repeat phrase 0 points 0 points  Total Score 0 0   Immunization History  Administered Date(s) Administered   Hepatitis B, adult 06/08/2016, 07/06/2016, 07/31/2016, 11/27/2016, 03/03/2017, 03/31/2017, 04/28/2017, 08/25/2017   Influenza Split 04/25/2012   Influenza, High Dose Seasonal PF 01/13/2019   Influenza,inj,Quad PF,6+ Mos 05/22/2014, 02/13/2015   Influenza-Unspecified 04/19/2013, 12/16/2016,  12/18/2017, 01/13/2019, 03/18/2020   Moderna Sars-Covid-2 Vaccination 08/02/2019, 08/30/2019   PFIZER SARS-COV-2 Vaccination 01/24/2020   Td 01/22/2005   Screening Tests Health Maintenance  Topic Date Due   Hepatitis C Screening  Never done   DEXA SCAN  Never done   PNA vac Low Risk Adult (1 of 2 - PCV13) Never done   TETANUS/TDAP  01/23/2015   OPHTHALMOLOGY EXAM  12/11/2019   FOOT EXAM  02/14/2020   COVID-19 Vaccine (4 - Booster) 07/24/2020   HEMOGLOBIN A1C  09/29/2020   INFLUENZA VACCINE  Completed   Cancer Screenings: Lung: Low Dose CT Chest recommended if Age 81-80 years, 30 pack-year currently smoking OR have quit w/in 15years. Patient does not qualify. Breast:  Up to date on Mammogram? Yes   Up to date of Bone Density/Dexa? No Colorectal:  NA  Plan:  You declined an advance directive today.  If you reconsider you can pick one up at our office.  Call to schedule your PCP apt for March 2022. Happy Holidays!  I have personally reviewed and noted the following in the patients chart:    Medical and social history  Use of alcohol, tobacco or illicit drugs   Current medications and supplements  Functional ability and status  Nutritional status  Physical activity  Advanced directives  List of other physicians  Hospitalizations, surgeries, and ER visits in previous 12 months  Vitals  Screenings to include cognitive, depression, and falls  Referrals and appointments  In addition, I have reviewed and discussed with patient certain preventive protocols, quality metrics, and best practice recommendations. A written personalized care plan for preventive services as well as general preventive health recommendations were provided to patient.  This visit was conducted virtually in the setting of the [place] pandemic.    [name], [place]  04/16/2020

## 2020-04-30 ENCOUNTER — Encounter: Payer: Self-pay | Admitting: Family Medicine

## 2020-05-02 ENCOUNTER — Ambulatory Visit: Payer: Medicare Other | Admitting: Family Medicine

## 2020-05-28 ENCOUNTER — Ambulatory Visit: Payer: Medicare Other | Admitting: Podiatry

## 2020-05-28 ENCOUNTER — Encounter: Payer: Self-pay | Admitting: Podiatry

## 2020-05-28 ENCOUNTER — Other Ambulatory Visit: Payer: Self-pay

## 2020-05-28 DIAGNOSIS — E1151 Type 2 diabetes mellitus with diabetic peripheral angiopathy without gangrene: Secondary | ICD-10-CM | POA: Diagnosis not present

## 2020-05-28 DIAGNOSIS — M792 Neuralgia and neuritis, unspecified: Secondary | ICD-10-CM | POA: Diagnosis not present

## 2020-05-28 DIAGNOSIS — Z794 Long term (current) use of insulin: Secondary | ICD-10-CM | POA: Diagnosis not present

## 2020-05-28 MED ORDER — PREGABALIN 75 MG PO CAPS: 75.0000 mg | ORAL_CAPSULE | Freq: Two times a day (BID) | ORAL | 1 refills | Status: DC

## 2020-05-28 NOTE — Progress Notes (Signed)
  Subjective:  Patient ID: Rachel Hobbs, female    DOB: 1940-07-15,  MRN: [id number]  Chief Complaint  Patient presents with  . Nail Problem  . Callouses    Left hallux has a place on it. Skin is peeling on the heels. PT stated that she has neuropathy     80 y.o. female presents with the above complaint.  Patient states that she is getting a lot of neuropathic pain.  Patient states is all the toes.  She has tried taking gabapentin which does help a little bit but not as much.  She would like to know if there is any other treatment options for it.  She denies any other acute complaints.  Objective:  Physical Exam: warm, good capillary refill, no trophic changes or ulcerative lesions, weakly palpable DP and PT pulses and onychomycosis x10.  Absent protective sensation..  Assessment:   1. Neuropathic pain   2. Type 2 diabetes mellitus with diabetic peripheral angiopathy without gangrene, with long-term current use of insulin ([place])      Plan:  Patient was evaluated and treated and all questions answered.  Patient educated on diabetes. Discussed proper diabetic foot care and discussed risks and complications of disease. Educated patient in depth on reasons to return to the office immediately should he/she discover anything concerning or new on the feet. All questions answered. Discussed proper shoes as well.   Neuropathic pain -I explained to patient the etiology of neuropathic pain and very treatment options were extensively discussed.  Given the amount of pain that she is having and is failing on gabapentin I believe she may benefit from Lyrica.  Lyrica was sent to the pharmacy.  I instructed her to you take it once or twice a day to help with her neuropathic pain.  She states understanding will try it out. .    No follow-ups on file.

## 2020-05-29 ENCOUNTER — Telehealth: Payer: Self-pay | Admitting: Podiatry

## 2020-05-29 NOTE — Telephone Encounter (Signed)
Please resend the medication to the [facility] on [address]. The pharmacy has not received it.

## 2020-05-30 ENCOUNTER — Other Ambulatory Visit: Payer: Self-pay

## 2020-05-30 MED ORDER — PREGABALIN 75 MG PO CAPS
75.0000 mg | ORAL_CAPSULE | Freq: Two times a day (BID) | ORAL | 1 refills | Status: DC
Start: 2020-05-30 — End: 2020-05-30

## 2020-05-30 MED ORDER — PREGABALIN 75 MG PO CAPS: 75.0000 mg | ORAL_CAPSULE | Freq: Two times a day (BID) | ORAL | 1 refills | Status: DC

## 2020-05-30 NOTE — Telephone Encounter (Signed)
done

## 2020-05-30 NOTE — Addendum Note (Signed)
Addended by: Boneta Lucks on: 05/30/2020 07:58 AM   Modules accepted: Orders

## 2020-06-02 ENCOUNTER — Other Ambulatory Visit: Payer: Self-pay | Admitting: Podiatry

## 2020-06-02 MED ORDER — PREGABALIN 75 MG PO CAPS: 75.0000 mg | ORAL_CAPSULE | Freq: Two times a day (BID) | ORAL | 1 refills | Status: DC

## 2020-06-02 NOTE — Telephone Encounter (Signed)
done

## 2020-06-02 NOTE — Telephone Encounter (Signed)
Patient left a voicemail stating that the pharmacy has not received the prescription.

## 2020-06-11 ENCOUNTER — Ambulatory Visit: Payer: Medicare Other | Admitting: Podiatry

## 2020-06-23 ENCOUNTER — Other Ambulatory Visit: Payer: Self-pay | Admitting: Family Medicine

## 2020-06-23 DIAGNOSIS — E118 Type 2 diabetes mellitus with unspecified complications: Secondary | ICD-10-CM

## 2020-08-06 ENCOUNTER — Other Ambulatory Visit: Payer: Self-pay

## 2020-08-06 ENCOUNTER — Encounter: Payer: Self-pay | Admitting: Podiatry

## 2020-08-06 ENCOUNTER — Ambulatory Visit (INDEPENDENT_AMBULATORY_CARE_PROVIDER_SITE_OTHER): Payer: Medicare Other | Admitting: Podiatry

## 2020-08-06 DIAGNOSIS — M79609 Pain in unspecified limb: Secondary | ICD-10-CM

## 2020-08-06 DIAGNOSIS — B351 Tinea unguium: Secondary | ICD-10-CM

## 2020-08-06 DIAGNOSIS — E0843 Diabetes mellitus due to underlying condition with diabetic autonomic (poly)neuropathy: Secondary | ICD-10-CM

## 2020-08-06 NOTE — Progress Notes (Signed)
This patient returns to my office for at risk foot care.  This patient requires this care by a professional since this patient will be at risk due to having chronic kidney disease and diabetes type 2.    This patient is unable to cut nails herself since the patient cannot reach her nails.These nails are painful walking and wearing shoes.  This patient presents for at risk foot care today.  Patient has not been seen in over 9 months.  General Appearance  Alert, conversant and in no acute stress.  Vascular  Dorsalis pedis and posterior tibial  pulses are not  palpable  bilaterally.  Capillary return is within normal limits  bilaterally. Temperature is within normal limits  Bilaterally.  Swelling bilaterally.  Neurologic  Senn-Weinstein monofilament wire test diminished  bilaterally. Muscle power within normal limits bilaterally.  Nails Thick disfigured discolored nails with subungual debris  from hallux to fifth toes bilaterally. No evidence of bacterial infection or drainage bilaterally.  Orthopedic  No limitations of motion  feet .  No crepitus or effusions noted.  No bony pathology or digital deformities noted. HAV  B/L.  Skin  normotropic skin with no porokeratosis noted bilaterally.  No signs of infections or ulcers noted.     Onychomycosis  Pain in right toes  Pain in left toes  Consent was obtained for treatment procedures.   Mechanical debridement of nails 1-5  bilaterally performed with a nail nipper.  Filed with dremel without incident. Patient would benefit from capasain treatment.   Return office visit    10 weeks                  Told patient to return for periodic foot care and evaluation due to potential at risk complications.   Gardiner Barefoot DPM

## 2020-08-29 ENCOUNTER — Other Ambulatory Visit: Payer: Self-pay

## 2020-08-29 ENCOUNTER — Ambulatory Visit: Payer: Medicare Other

## 2020-08-29 ENCOUNTER — Ambulatory Visit (INDEPENDENT_AMBULATORY_CARE_PROVIDER_SITE_OTHER): Payer: Medicare Other | Admitting: Podiatry

## 2020-08-29 DIAGNOSIS — M792 Neuralgia and neuritis, unspecified: Secondary | ICD-10-CM

## 2020-08-29 DIAGNOSIS — M778 Other enthesopathies, not elsewhere classified: Secondary | ICD-10-CM | POA: Diagnosis not present

## 2020-08-29 DIAGNOSIS — M722 Plantar fascial fibromatosis: Secondary | ICD-10-CM

## 2020-09-02 ENCOUNTER — Encounter: Payer: Self-pay | Admitting: Podiatry

## 2020-09-02 NOTE — Progress Notes (Signed)
Subjective:  Patient ID: Rachel Hobbs, female    DOB: 26-Apr-1940,  MRN: [id number]  Chief Complaint  Patient presents with  . Peripheral Neuropathy    B/L pain/numbness in feet. Reports never taking lyrica that was discussed in previous visit d/t cost.     80 y.o. female presents with the above complaint.  Patient presents with pain to the right plantar midfoot.  Patient states is painful to touch.  It hurts at the central band of the plantar fascia.  Hurts with every step.  She states that she has not taken Lyrica for neuropathy.  The neuropathy tends to be continuous worse.  She denies any other acute complaints.  She would like to discuss treatment options.  She is open to injection as well.   Review of Systems: Negative except as noted in the HPI. Denies N/V/F/Ch.  Past Medical History:  Diagnosis Date  . Anemia of renal disease 06/16/2006   Qualifier: Diagnosis of  By: Marinell Blight, Dawn    . Arthritis   . CHF (congestive heart failure) ([place])   . Depression   . Diabetes mellitus    type 2  . Hernia   . Hyperlipidemia   . Hypertension   . Iron deficiency anemia   . Low iron   . Nodule of soft tissue 06/22/2012  . Peripheral vascular disease (HCC)    in legs  . Pneumonia    teenager  . Renal disorder    CKD - dialysis T/TH/Sa  . Shortness of breath dyspnea    with exertion  . SMALL BOWEL OBSTRUCTION, HX OF 02/15/2007   Qualifier: Diagnosis of  By: [name], HEIDI      Current Outpatient Medications:  .  acetaminophen (TYLENOL) 500 MG tablet, Take 500 mg by mouth every 6 (six) hours as needed for mild pain or headache., Disp: , Rfl:  .  aspirin 81 MG tablet, Take 81 mg by mouth daily., Disp: , Rfl:  .  Aspirin-Calcium Carbonate 81-777 MG TABS, Take by mouth., Disp: , Rfl:  .  atorvastatin (LIPITOR) 40 MG tablet, TAKE 1 TABLET(40 MG) BY MOUTH DAILY AT 6 PM, Disp: 90 tablet, Rfl: 3 .  B Complex-C-Folic Acid (NEPHRO VITAMINS) 0.8 MG TABS, Take 1 tablet by mouth daily.,  Disp: , Rfl:  .  bisacodyl (DULCOLAX) 5 MG EC tablet, Take 5 mg by mouth daily as needed for moderate constipation., Disp: , Rfl:  .  Blood Glucose Monitoring Suppl (BLOOD GLUCOSE METER) kit, Use as instructed, Disp: 1 each, Rfl: 0 .  Blood Glucose Monitoring Suppl (ONETOUCH VERIO) w/Device KIT, Use daily as indicated, Disp: 1 kit, Rfl: 0 .  calcium acetate (PHOSLO) 667 MG capsule, Take 667-1,334 mg by mouth See admin instructions. Take 1334 mg with each meal and 667 mg with each snack, Disp: , Rfl:  .  doxycycline (VIBRAMYCIN) 100 MG capsule, Take 100 mg by mouth 2 (two) times daily., Disp: , Rfl:  .  ethyl chloride spray, , Disp: , Rfl: 0 .  gabapentin (NEURONTIN) 100 MG capsule, Take 1 capsule (100 mg total) by mouth 3 (three) times daily. Take 100 mg in morning, and 200 mg at night, Disp: 90 capsule, Rfl: 3 .  glucose blood (ONETOUCH VERIO) test strip, Use as instructed, Disp: 100 each, Rfl: 12 .  insulin lispro (HUMALOG KWIKPEN) 100 UNIT/ML KwikPen, Inject 0.08 mLs (8 Units total) into the skin daily before supper., Disp: 15 mL, Rfl: 11 .  Insulin Pen Needle  31G X 8 MM MISC, BD UltraFine III Pen Needles. For use with insulin pen device. Inject insulin 2X daily, Disp: 100 each, Rfl: 3 .  isosorbide mononitrate (IMDUR) 30 MG 24 hr tablet, TAKE 1 TABLET(30 MG) BY MOUTH DAILY, Disp: 90 tablet, Rfl: 2 .  Lancets (ONETOUCH ULTRASOFT) lancets, Once daily testing plus prn for hypoglycemia, Disp: 100 each, Rfl: 9 .  LANTUS SOLOSTAR 100 UNIT/ML Solostar Pen, Inject 44 Units into the skin at bedtime., Disp: 15 mL, Rfl: 3 .  Methoxy PEG-Epoetin Beta (MIRCERA IJ), Mircera, Disp: , Rfl:  .  Multiple Vitamins-Minerals (QUIN B STRONG) TABS, Take by mouth., Disp: , Rfl:  .  NEEDLE, DISP, 30 G (B-D DISP NEEDLE 30GX1") 30G X 1" MISC, 1 each by Does not apply route daily., Disp: 100 each, Rfl: 2 .  ONE TOUCH ULTRA TEST test strip, TEST BLOOD SUGAR EVERY DAY AND AS NEEDED FOR SYMPTOMS OF HYPOGLYCEMIA, Disp: 100  each, Rfl: 0 .  ONETOUCH DELICA LANCETS 70Y MISC, Use daily as indicated, Disp: 100 each, Rfl: 0 .  pregabalin (LYRICA) 75 MG capsule, Take 1 capsule (75 mg total) by mouth 2 (two) times daily., Disp: 30 capsule, Rfl: 1 .  pregabalin (LYRICA) 75 MG capsule, Take 1 capsule (75 mg total) by mouth 2 (two) times daily., Disp: 30 capsule, Rfl: 1 .  pregabalin (LYRICA) 75 MG capsule, Take 1 capsule (75 mg total) by mouth 2 (two) times daily., Disp: 30 capsule, Rfl: 1 .  Semaglutide,0.25 or 0.5MG/DOS, (OZEMPIC, 0.25 OR 0.5 MG/DOSE,) 2 MG/1.5ML SOPN, Inject 0.1875 mLs (0.25 mg total) into the skin every 7 (seven) days., Disp: 1.5 mL, Rfl: 0 .  Skin Protectants, Misc. (EUCERIN) cream, Apply topically as needed for dry skin., Disp: 454 g, Rfl: 0 .  triamcinolone (KENALOG) 0.025 % cream, Apply topically., Disp: , Rfl:   Social History   Tobacco Use  Smoking Status Never Smoker  Smokeless Tobacco Never Used    No Known Allergies Objective:  There were no vitals filed for this visit. There is no height or weight on file to calculate BMI. Constitutional Well developed. Well nourished.  Vascular Dorsalis pedis pulses palpable bilaterally. Posterior tibial pulses palpable bilaterally. Capillary refill normal to all digits.  No cyanosis or clubbing noted. Pedal hair growth normal.  Neurologic Normal speech. Oriented to person, place, and time. Decreased sensation to light touch grossly present bilaterally.  Dermatologic Nails well groomed and normal in appearance. No open wounds. No skin lesions.  Orthopedic:  Pain along the course of the plantar fascia.  High pain at the plantar midfoot segment of the plantar fascia.  Pain with dorsiflexion of the digits.  No pain at the calcaneal tuber.   Radiographs: None Assessment:   1. Plantar fasciitis, right   2. Capsulitis of right foot    Plan:  Patient was evaluated and treated and all questions answered.  Right plantar midfoot capsulitis with  underlying plantar fasciitis -I explained the patient the etiology of capsulitis numbers treatment options were discussed.  Given the amount of pain she is having I believe she will benefit from a steroid injection of decrease acute inflammatory component associated pain.  She states understanding and would like to proceed with a steroid injection. -A steroid injection was performed at right plantar midfoot at point of maximal tenderness using 1% plain Lidocaine and 10 mg of Kenalog. This was well tolerated.   No follow-ups on file.

## 2020-09-04 ENCOUNTER — Encounter (INDEPENDENT_AMBULATORY_CARE_PROVIDER_SITE_OTHER): Payer: Medicare Other | Admitting: Ophthalmology

## 2020-09-19 ENCOUNTER — Other Ambulatory Visit: Payer: Self-pay

## 2020-09-19 MED ORDER — ISOSORBIDE MONONITRATE ER 30 MG PO TB24: ORAL_TABLET | ORAL | 2 refills | Status: DC

## 2020-09-22 ENCOUNTER — Ambulatory Visit (INDEPENDENT_AMBULATORY_CARE_PROVIDER_SITE_OTHER): Payer: Medicare Other | Admitting: Ophthalmology

## 2020-09-22 ENCOUNTER — Other Ambulatory Visit: Payer: Self-pay

## 2020-09-22 ENCOUNTER — Encounter (INDEPENDENT_AMBULATORY_CARE_PROVIDER_SITE_OTHER): Payer: Self-pay | Admitting: Ophthalmology

## 2020-09-22 DIAGNOSIS — H2513 Age-related nuclear cataract, bilateral: Secondary | ICD-10-CM | POA: Diagnosis not present

## 2020-09-22 DIAGNOSIS — E113393 Type 2 diabetes mellitus with moderate nonproliferative diabetic retinopathy without macular edema, bilateral: Secondary | ICD-10-CM | POA: Diagnosis not present

## 2020-09-22 NOTE — Assessment & Plan Note (Signed)
The nature of cataract was discussed with the patient as well as the elective nature of surgery. The patient was reassured that surgery at a later date does not put the patient at risk for a worse outcome. It was emphasized that the need for surgery is dictated by the patient's quality of life as influenced by the cataract. Patient was instructed to maintain close follow up with their general eye care doctor.  Yellow sunglasses Story and analogy reviewed with the patient.  Patient understands to follow-up with Dr. Nathanial Rancher and [facility] eye care for consideration of cataract extraction with intraocular lens placement in the future

## 2020-09-22 NOTE — Assessment & Plan Note (Signed)
The nature of moderate nonproliferative diabetic retinopathy was discussed with the patient as well as the need for more frequent follow up to judge for progression. Good blood glucose, blood pressure, and serum lipid control was recommended as well as avoidance of smoking and maintenance of normal weight.  Close follow up with PCP encouraged. °

## 2020-09-22 NOTE — Progress Notes (Signed)
09/22/2020     CHIEF COMPLAINT Patient presents for Retina Follow Up (6 Mo F/U OU//Pt reports floaters off and on OU. Pt c/o itching and watering OU. VA stable OU./A1c: 8.1, 3 mo ago/LBS: 125 3 wks ago)   HISTORY OF PRESENT ILLNESS: Rachel Hobbs is a 80 y.o. female who presents to the clinic today for:   HPI    Retina Follow Up    Diagnosis: Diabetic Retinopathy   Laterality: both eyes   Onset: 6 months ago   Severity: mild   Duration: 6 months   Course: stable   Comments: 6 Mo F/U OU  Pt reports floaters off and on OU. Pt c/o itching and watering OU. VA stable OU. A1c: 8.1, 3 mo ago LBS: 125 3 wks ago          Comments    Patient does have awareness of needing stronger light to view items particularly on dark rainy days nights       Last edited by Edmon Crape, MD on 09/22/2020  2:42 PM. (History)      Referring physician: Jovita Kussmaul, MD [address] [place],  [place] [address]  HISTORICAL INFORMATION:   Selected notes from the MEDICAL RECORD NUMBER    Lab Results  Component Value Date   HGBA1C 7.9 (A) 03/31/2020     CURRENT MEDICATIONS: No current outpatient medications on file. (Ophthalmic Drugs)   No current facility-administered medications for this visit. (Ophthalmic Drugs)   Current Outpatient Medications (Other)  Medication Sig  . acetaminophen (TYLENOL) 500 MG tablet Take 500 mg by mouth every 6 (six) hours as needed for mild pain or headache.  Marland Kitchen aspirin 81 MG tablet Take 81 mg by mouth daily.  . Aspirin-Calcium Carbonate 81-777 MG TABS Take by mouth.  Marland Kitchen atorvastatin (LIPITOR) 40 MG tablet TAKE 1 TABLET(40 MG) BY MOUTH DAILY AT 6 PM  . B Complex-C-Folic Acid (NEPHRO VITAMINS) 0.8 MG TABS Take 1 tablet by mouth daily.  . bisacodyl (DULCOLAX) 5 MG EC tablet Take 5 mg by mouth daily as needed for moderate constipation.  . Blood Glucose Monitoring Suppl (BLOOD GLUCOSE METER) kit Use as instructed  . Blood Glucose Monitoring Suppl (ONETOUCH VERIO)  w/Device KIT Use daily as indicated  . calcium acetate (PHOSLO) 667 MG capsule Take 667-1,334 mg by mouth See admin instructions. Take 1334 mg with each meal and 667 mg with each snack  . doxycycline (VIBRAMYCIN) 100 MG capsule Take 100 mg by mouth 2 (two) times daily.  Marland Kitchen ethyl chloride spray   . gabapentin (NEURONTIN) 100 MG capsule Take 1 capsule (100 mg total) by mouth 3 (three) times daily. Take 100 mg in morning, and 200 mg at night  . glucose blood (ONETOUCH VERIO) test strip Use as instructed  . insulin lispro (HUMALOG KWIKPEN) 100 UNIT/ML KwikPen Inject 0.08 mLs (8 Units total) into the skin daily before supper.  . Insulin Pen Needle 31G X 8 MM MISC BD UltraFine III Pen Needles. For use with insulin pen device. Inject insulin 2X daily  . isosorbide mononitrate (IMDUR) 30 MG 24 hr tablet TAKE 1 TABLET(30 MG) BY MOUTH DAILY  . Lancets (ONETOUCH ULTRASOFT) lancets Once daily testing plus prn for hypoglycemia  . LANTUS SOLOSTAR 100 UNIT/ML Solostar Pen Inject 44 Units into the skin at bedtime.  . Methoxy PEG-Epoetin Beta (MIRCERA IJ) Mircera  . Multiple Vitamins-Minerals (QUIN B STRONG) TABS Take by mouth.  Marland Kitchen NEEDLE, DISP, 30 G (B-D DISP NEEDLE 30GX1")  30G X 1" MISC 1 each by Does not apply route daily.  . ONE TOUCH ULTRA TEST test strip TEST BLOOD SUGAR EVERY DAY AND AS NEEDED FOR SYMPTOMS OF HYPOGLYCEMIA  . ONETOUCH DELICA LANCETS 74B MISC Use daily as indicated  . pregabalin (LYRICA) 75 MG capsule Take 1 capsule (75 mg total) by mouth 2 (two) times daily.  . pregabalin (LYRICA) 75 MG capsule Take 1 capsule (75 mg total) by mouth 2 (two) times daily.  . pregabalin (LYRICA) 75 MG capsule Take 1 capsule (75 mg total) by mouth 2 (two) times daily.  . Semaglutide,0.25 or 0.5MG /DOS, (OZEMPIC, 0.25 OR 0.5 MG/DOSE,) 2 MG/1.5ML SOPN Inject 0.1875 mLs (0.25 mg total) into the skin every 7 (seven) days.  . Skin Protectants, Misc. (EUCERIN) cream Apply topically as needed for dry skin.  Marland Kitchen  triamcinolone (KENALOG) 0.025 % cream Apply topically.   No current facility-administered medications for this visit. (Other)      REVIEW OF SYSTEMS:    ALLERGIES No Known Allergies  PAST MEDICAL HISTORY Past Medical History:  Diagnosis Date  . Anemia of renal disease 06/16/2006   Qualifier: Diagnosis of  By: Marinell Blight, Dawn    . Arthritis   . CHF (congestive heart failure) ([place])   . Depression   . Diabetes mellitus    type 2  . Hernia   . Hyperlipidemia   . Hypertension   . Iron deficiency anemia   . Low iron   . Nodule of soft tissue 06/22/2012  . Peripheral vascular disease (HCC)    in legs  . Pneumonia    teenager  . Renal disorder    CKD - dialysis T/TH/Sa  . Shortness of breath dyspnea    with exertion  . SMALL BOWEL OBSTRUCTION, HX OF 02/15/2007   Qualifier: Diagnosis of  By: [name], HEIDI     Past Surgical History:  Procedure Laterality Date  . ABDOMINAL HYSTERECTOMY     in the 70's  . AV FISTULA PLACEMENT Left 12/05/2015   Procedure: RADIOCEPHALIC VERSUS BRACHIOCEPHALIC ARTERIOVENOUS (AV) FISTULA CREATION;  Surgeon: [name], MD;  Location: [facility] OR;  Service: Vascular;  Laterality: Left;  . AV FISTULA PLACEMENT Left 05/20/2017   Procedure: INSERTION OF ARTERIOVENOUS (AV) GORE-TEX VASCULAR STRETCH 4-7 GRAFT ARM LEFT UPPER ARM;  Surgeon: [name], MD;  Location: [place];  Service: Vascular;  Laterality: Left;  . BASCILIC VEIN TRANSPOSITION Left 06/04/2016   Procedure: FIRST STAGE BASILIC VEIN TRANSPOSITION;  Surgeon: [name], MD;  Location: [place];  Service: Vascular;  Laterality: Left;  . BASCILIC VEIN TRANSPOSITION Left 08/04/2016   Procedure: LEFT UPPER ARM [place], SECOND STAGE;  Surgeon: [name], MD;  Location: [place];  Service: Vascular;  Laterality: Left;  . HERNIA REPAIR     umbilical in the 44'H  . INSERTION OF DIALYSIS CATHETER Right 12/05/2015   Procedure: INSERTION OF DIALYSIS CATHETER;  Surgeon:  [name], MD;  Location: [place];  Service: Vascular;  Laterality: Right;  . IR THROMBECTOMY AV FISTULA W/THROMBOLYSIS/PTA INC/SHUNT/IMG LEFT Left 05/09/2019  . IR US GUIDE VASC ACCESS LEFT  05/09/2019    FAMILY HISTORY Family History  Problem Relation Age of Onset  . Kidney disease Mother   . Hypertension Mother   . COPD Father        smoke  . Cancer Father        Lung  . Hypertension Father   . Kidney disease  Sister   . Diabetes Daughter     SOCIAL HISTORY Social History   Tobacco Use  . Smoking status: Never Smoker  . Smokeless tobacco: Never Used  Vaping Use  . Vaping Use: Never used  Substance Use Topics  . Alcohol use: No  . Drug use: No         OPHTHALMIC EXAM: Base Eye Exam    Visual Acuity (ETDRS)      Right Left   Dist [place] 20/40 20/30 -1   Dist ph [place] 20/25 -1 20/25 -1       Tonometry (Tonopen, 2:04 PM)      Right Left   Pressure 15 17       Pupils      Pupils Dark Light Shape React APD   Right PERRL 3 3 Round Minimal None   Left PERRL 3 3 Round Minimal None       Visual Fields (Counting fingers)      Left Right    Full Full       Extraocular Movement      Right Left    Full Full       Neuro/Psych    Oriented x3: Yes   Mood/Affect: Normal       Dilation    Both eyes: 1.0% Mydriacyl, 2.5% Phenylephrine @ 2:04 PM        Slit Lamp and Fundus Exam    External Exam      Right Left   External Normal Normal       Slit Lamp Exam      Right Left   Lids/Lashes Normal Normal   Conjunctiva/Sclera White and quiet White and quiet   Cornea Clear Clear   Anterior Chamber Narrow angle,,at 1/2 ct depth Deep and quiet.. narrow, at 1/2 ac deptha   Iris Round and reactive Round and reactive   Lens 3+ Nuclear sclerosis, 3+ Cortical cataract 2+ Cortical cataract, 2+ Nuclear sclerosis   Anterior Vitreous Posterior vitreous detachment Normal       Fundus Exam      Right Left   Posterior Vitreous Posterior vitreous detachment Normal    Disc Normal Normal   C/D Ratio 0.3 0.25   Macula Normal Normal, no macular thickening, no clinically significant macular edema   Vessels Moderate NPDR Moderate NPDR   Periphery Normal Normal          IMAGING AND PROCEDURES  Imaging and Procedures for 09/22/20  OCT, Retina - OU - Both Eyes       Right Eye Quality was good. Scan locations included subfoveal. Central Foveal Thickness: 260. Progression has been stable. Findings include normal foveal contour.   Left Eye Quality was good. Scan locations included subfoveal. Central Foveal Thickness: 282. Progression has been stable. Findings include cystoid macular edema.   Notes Moderate nonproliferative diabetic retinopathy with Minor perifoveal leakage CME superotemporal OS only   OD, no active maculopathy                ASSESSMENT/PLAN:  Moderate nonproliferative diabetic retinopathy of both eyes without macular edema associated with type 2 diabetes mellitus ([place]) The nature of moderate nonproliferative diabetic retinopathy was discussed with the patient as well as the need for more frequent follow up to judge for progression. Good blood glucose, blood pressure, and serum lipid control was recommended as well as avoidance of smoking and maintenance of normal weight.  Close follow up with PCP encouraged.  Nuclear sclerotic cataract of both eyes The  nature of cataract was discussed with the patient as well as the elective nature of surgery. The patient was reassured that surgery at a later date does not put the patient at risk for a worse outcome. It was emphasized that the need for surgery is dictated by the patient's quality of life as influenced by the cataract. Patient was instructed to maintain close follow up with their general eye care doctor.  Yellow sunglasses Story and analogy reviewed with the patient.  Patient understands to follow-up with Dr. Nathanial Rancher and [facility] eye care for consideration of cataract  extraction with intraocular lens placement in the future      ICD-10-CM   1. Moderate nonproliferative diabetic retinopathy of both eyes without macular edema associated with type 2 diabetes mellitus (HCC)  Y63.7858 OCT, Retina - OU - Both Eyes  2. Nuclear sclerotic cataract of both eyes  H25.13     1.  No progression of moderate nonproliferative diabetic retinopathy in either eye.  Minor perifoveal CME has resolved by OCT and clinically.  2.  Patient is unaware of symptoms of her cataract until I did explain to her the dark yellow-green nature of the lenses and some cloudiness which she is now aware that this is related to having difficulty in darker rooms, nighttime vision issues.    3.  I briefly explained the nature of cataract surgery and the remedy for such a condition and asked that patient follow-up with Dr. Carolynn Sayers for baseline testing and discussion about her options and timing should it be required for surgery  Ophthalmic Meds Ordered this visit:  No orders of the defined types were placed in this encounter.      Return in about 6 months (around 03/24/2021) for DILATE OU, OCT.  There are no Patient Instructions on file for this visit.   Explained the diagnoses, plan, and follow up with the patient and they expressed understanding.  Patient expressed understanding of the importance of proper follow up care.   Clent Demark Joven Mom M.D. Diseases & Surgery of the Retina and Vitreous Retina & Diabetic [place] 09/22/20     Abbreviations: M myopia (nearsighted); A astigmatism; H hyperopia (farsighted); P presbyopia; Mrx spectacle prescription;  CTL contact lenses; OD right eye; OS left eye; OU both eyes  XT exotropia; ET esotropia; PEK punctate epithelial keratitis; PEE punctate epithelial erosions; DES dry eye syndrome; MGD meibomian gland dysfunction; ATs artificial tears; PFAT's preservative free artificial tears; [place] nuclear sclerotic cataract; PSC posterior subcapsular  cataract; ERM epi-retinal membrane; PVD posterior vitreous detachment; RD retinal detachment; DM diabetes mellitus; DR diabetic retinopathy; NPDR non-proliferative diabetic retinopathy; PDR proliferative diabetic retinopathy; CSME clinically significant macular edema; DME diabetic macular edema; dbh dot blot hemorrhages; CWS cotton wool spot; POAG primary open angle glaucoma; C/D cup-to-disc ratio; HVF humphrey visual field; GVF goldmann visual field; OCT optical coherence tomography; IOP intraocular pressure; BRVO Branch retinal vein occlusion; CRVO central retinal vein occlusion; CRAO central retinal artery occlusion; BRAO branch retinal artery occlusion; RT retinal tear; SB scleral buckle; PPV pars plana vitrectomy; VH Vitreous hemorrhage; PRP panretinal laser photocoagulation; IVK intravitreal kenalog; VMT vitreomacular traction; MH Macular hole;  NVD neovascularization of the disc; NVE neovascularization elsewhere; AREDS age related eye disease study; ARMD age related macular degeneration; POAG primary open angle glaucoma; EBMD epithelial/anterior basement membrane dystrophy; ACIOL anterior chamber intraocular lens; IOL intraocular lens; PCIOL posterior chamber intraocular lens; Phaco/IOL phacoemulsification with intraocular lens placement; PRK photorefractive keratectomy; LASIK laser assisted in situ keratomileusis; HTN hypertension;  DM diabetes mellitus; COPD chronic obstructive pulmonary disease

## 2020-10-08 ENCOUNTER — Other Ambulatory Visit: Payer: Self-pay | Admitting: *Deleted

## 2020-10-08 MED ORDER — LANTUS SOLOSTAR 100 UNIT/ML ~~LOC~~ SOPN: 44.0000 [IU] | PEN_INJECTOR | Freq: Every day | SUBCUTANEOUS | 3 refills | Status: DC

## 2020-10-27 ENCOUNTER — Telehealth: Payer: Self-pay | Admitting: *Deleted

## 2020-10-27 NOTE — Telephone Encounter (Signed)
Notes received and placed in Dr. Manus Rudd' box for review.  Author [place],CMA

## 2020-10-27 NOTE — Telephone Encounter (Signed)
Received voicemail from patient requesting a referral to cardiology per dialysis center.  I called [facility] and they will fax PCP last few notes to show what concerns patient had while she was getting treatment.  Will forward to MD to place referral if necessary once notes are reviewed.  [place],CMA

## 2020-11-03 ENCOUNTER — Encounter: Payer: Self-pay | Admitting: Family Medicine

## 2020-11-03 ENCOUNTER — Telehealth: Payer: Self-pay | Admitting: Family Medicine

## 2020-11-03 ENCOUNTER — Other Ambulatory Visit: Payer: Self-pay

## 2020-11-03 ENCOUNTER — Ambulatory Visit (INDEPENDENT_AMBULATORY_CARE_PROVIDER_SITE_OTHER): Payer: Medicare Other | Admitting: Family Medicine

## 2020-11-03 VITALS — BP 145/61 | HR 83 | Wt 226.4 lb

## 2020-11-03 DIAGNOSIS — R220 Localized swelling, mass and lump, head: Secondary | ICD-10-CM

## 2020-11-03 DIAGNOSIS — R0600 Dyspnea, unspecified: Secondary | ICD-10-CM

## 2020-11-03 DIAGNOSIS — I5030 Unspecified diastolic (congestive) heart failure: Secondary | ICD-10-CM | POA: Diagnosis not present

## 2020-11-03 DIAGNOSIS — R0609 Other forms of dyspnea: Secondary | ICD-10-CM

## 2020-11-03 DIAGNOSIS — E1151 Type 2 diabetes mellitus with diabetic peripheral angiopathy without gangrene: Secondary | ICD-10-CM

## 2020-11-03 LAB — POCT GLYCOSYLATED HEMOGLOBIN (HGB A1C): HbA1c, POC (controlled diabetic range): 8.5 % — AB (ref 0.0–7.0)

## 2020-11-03 MED ORDER — LANTUS SOLOSTAR 100 UNIT/ML ~~LOC~~ SOPN: 50.0000 [IU] | PEN_INJECTOR | Freq: Every day | SUBCUTANEOUS | 3 refills | Status: DC

## 2020-11-03 NOTE — Telephone Encounter (Signed)
Patient informed via voicemail message the echo will be scheduled for her and we will call back with the information.  Nyjah Schwake Autry-Lott, DO 11/03/2020, 1:39 PM PGY-3, [place]

## 2020-11-03 NOTE — Progress Notes (Signed)
    SUBJECTIVE:   CHIEF COMPLAINT / HPI:   [name] is a 80 yo F who presents for the following:  Referral to Cardiologist Would like to be referred to the cardiologist.  Her nephrologist was also suggested this.  Last time at dialysis a few days ago she felt her heart beating in her ears.  She also feels as if her heart is skipping a beat.  She does have shortness of breath with walking.  Denies chest pain or shortness of breath at rest.  Has a history of heart failure and has seen cardiologist in the past.  Diabetes Current Regimen: Lantus 48 units nightly, endorses skipping meals and will occasionally not take insulin when she skips her meals. CBGs: 90-120 fasting Last A1c: 7.9 on 03/31/2020  Denies polyuria, polydipsia, hypoglycemia Last Eye Exam: Per chart review 8/24 2020 encouraged to update Statin: Lipitor 40 mg nightly ACE/ARB: N/A  Head mass Seen for this several months prior does not feel as if it is enlarging but continues to bother patient.  Admits that it has changed in appearance.  It is not painful.  Would like to be seen in our dermatology clinic.  PERTINENT  PMH / PSH: HTN (taken off of medication due to hypotension with dialysis), diastolic heart failure, ESRD (Tuesday/Thursday/Saturday)  OBJECTIVE:   BP (!) 145/61   Pulse 83   Wt 226 lb 6.4 oz (102.7 kg)   SpO2 100%   BMI 41.41 kg/m    General: Appears well, no acute distress. Age appropriate. Cardiac: RRR, normal heart sounds, no murmurs Respiratory: CTAB, normal effort Skin: 3 cm fluctuant mass on right parietal area of the head. With black necrotic appearing center.  Media Information         Document Information  Photos    11/03/2020 12:07  Attached To:  Office Visit on 11/03/20 with Autry-Lott, Naaman Plummer, DO   Source Information  Autry-Lott, Naaman Plummer, DO  [facility]   Extremities: No LE edema or cyanosis.  ASSESSMENT/PLAN:   Type 2 diabetes mellitus with diabetic  peripheral angiopathy without gangrene (HCC) Hgb increased from 7.9 to 8.5. CBGs reported to be 100-120.  -Increase lantus to 50 units qhs; can scale back if hypoglycemia episodes occur.  Patient given precautions. -Follow up in 3 months repeat [id number]  Diastolic CHF ([place]) Endorsing symptoms of dyspnea on exertion as well as possible palpitations. Heart sounds today are normal.  SBP moderately elevated.  Question if dyspnea and palpitations are felt during dialysis for possible fluid down symptoms.  Patient is currently asymptomatic today.  Previous EF of 65-70 in 2017.  Will obtain echo and refer to cardiology.  Head mass Chronic.  Last office visit 7 months prior initially thought to be a epidermoid cyst.  Now changing as compared to last pictures necrotic appearing center more prominent.  Patient would like to be seen in dermatology clinic for possible evaluation and removal. -Scheduled with dermatology clinic    Gerlene Fee, [place]

## 2020-11-03 NOTE — Patient Instructions (Addendum)
I have referred you to cardiology.  You should receive a call within a week.  We increased your Lantus to 50 units.  Please make sure you continue to get all of your meals in. Follow up in 3 months for another A1c  For the bump on your head we have scheduled a dermatology visit 01/08/21.   Dr. Janus Molder

## 2020-11-06 NOTE — Assessment & Plan Note (Signed)
Endorsing symptoms of dyspnea on exertion as well as possible palpitations. Heart sounds today are normal.  SBP moderately elevated.  Question if dyspnea and palpitations are felt during dialysis for possible fluid down symptoms.  Patient is currently asymptomatic today.  Previous EF of 65-70 in 2017.  Will obtain echo and refer to cardiology.

## 2020-11-06 NOTE — Assessment & Plan Note (Signed)
Hgb increased from 7.9 to 8.5. CBGs reported to be 100-120.  -Increase lantus to 50 units qhs; can scale back if hypoglycemia episodes occur.  Patient given precautions. -Follow up in 3 months repeat A1c

## 2020-11-06 NOTE — Assessment & Plan Note (Addendum)
Chronic.  Last office visit 7 months prior initially thought to be a epidermoid cyst.  Now changing as compared to last pictures necrotic appearing center more prominent.  Patient would like to be seen in dermatology clinic for possible evaluation and removal. -Scheduled with dermatology clinic

## 2020-11-12 ENCOUNTER — Ambulatory Visit: Payer: Medicare Other | Admitting: Podiatry

## 2020-11-26 ENCOUNTER — Ambulatory Visit (HOSPITAL_COMMUNITY)
Admission: RE | Admit: 2020-11-26 | Discharge: 2020-11-26 | Disposition: A | Discharge status: Home / Self Care | Payer: Medicare Other | Source: Ambulatory Visit | Attending: Family Medicine | Admitting: Family Medicine

## 2020-11-26 ENCOUNTER — Other Ambulatory Visit: Payer: Self-pay

## 2020-11-26 DIAGNOSIS — R0602 Shortness of breath: Secondary | ICD-10-CM | POA: Diagnosis not present

## 2020-11-26 DIAGNOSIS — R0600 Dyspnea, unspecified: Secondary | ICD-10-CM | POA: Insufficient documentation

## 2020-11-26 DIAGNOSIS — I509 Heart failure, unspecified: Secondary | ICD-10-CM | POA: Insufficient documentation

## 2020-11-26 DIAGNOSIS — R0609 Other forms of dyspnea: Secondary | ICD-10-CM | POA: Diagnosis not present

## 2020-11-26 DIAGNOSIS — N186 End stage renal disease: Secondary | ICD-10-CM | POA: Insufficient documentation

## 2020-11-26 DIAGNOSIS — E1122 Type 2 diabetes mellitus with diabetic chronic kidney disease: Secondary | ICD-10-CM | POA: Diagnosis not present

## 2020-11-26 DIAGNOSIS — I132 Hypertensive heart and chronic kidney disease with heart failure and with stage 5 chronic kidney disease, or end stage renal disease: Secondary | ICD-10-CM | POA: Insufficient documentation

## 2020-11-26 DIAGNOSIS — E785 Hyperlipidemia, unspecified: Secondary | ICD-10-CM | POA: Insufficient documentation

## 2020-11-26 LAB — ECHOCARDIOGRAM COMPLETE
Area-P 1/2: 4.15 cm2
Calc EF: 67.1 %
MV VTI: 1.84 cm2
S' Lateral: 2.4 cm
Single Plane A2C EF: 63.8 %
Single Plane A4C EF: 68.2 %

## 2020-11-26 NOTE — Progress Notes (Signed)
  Echocardiogram 2D Echocardiogram has been performed.  Rachel Hobbs 11/26/2020, 4:00 PM

## 2020-11-28 ENCOUNTER — Ambulatory Visit (INDEPENDENT_AMBULATORY_CARE_PROVIDER_SITE_OTHER): Payer: Medicare Other | Admitting: Podiatry

## 2020-11-28 ENCOUNTER — Encounter: Payer: Self-pay | Admitting: Podiatry

## 2020-11-28 ENCOUNTER — Other Ambulatory Visit: Payer: Self-pay

## 2020-11-28 DIAGNOSIS — B351 Tinea unguium: Secondary | ICD-10-CM | POA: Diagnosis not present

## 2020-11-28 DIAGNOSIS — M79676 Pain in unspecified toe(s): Secondary | ICD-10-CM | POA: Diagnosis not present

## 2020-11-28 DIAGNOSIS — L84 Corns and callosities: Secondary | ICD-10-CM

## 2020-11-28 DIAGNOSIS — M792 Neuralgia and neuritis, unspecified: Secondary | ICD-10-CM

## 2020-11-28 DIAGNOSIS — E0843 Diabetes mellitus due to underlying condition with diabetic autonomic (poly)neuropathy: Secondary | ICD-10-CM | POA: Diagnosis not present

## 2020-11-28 NOTE — Progress Notes (Signed)
This patient returns to my office for at risk foot care.  This patient requires this care by a professional since this patient will be at risk due to having chronic kidney disease and diabetes type 2.    This patient is unable to cut nails herself since the patient cannot reach her nails.These nails are painful walking and wearing shoes. Patient has painful callus on her bunion left foot. This patient presents for at risk foot care today.   General Appearance  Alert, conversant and in no acute stress.  Vascular  Dorsalis pedis and posterior tibial  pulses are not  palpable  bilaterally.  Capillary return is within normal limits  bilaterally. Temperature is within normal limits  Bilaterally.  Swelling bilaterally.  Neurologic  Senn-Weinstein monofilament wire test diminished  bilaterally. Muscle power within normal limits bilaterally.  Nails Thick disfigured discolored nails with subungual debris  from hallux to fifth toes bilaterally. No evidence of bacterial infection or drainage bilaterally.  Orthopedic  No limitations of motion  feet .  No crepitus or effusions noted.  No bony pathology or digital deformities noted. HAV  B/L.  Skin  normotropic skin with no porokeratosis noted bilaterally.  No signs of infections or ulcers noted.   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  Consent was obtained for treatment procedures.   Mechanical debridement of nails 1-5  bilaterally performed with a nail nipper.  Filed with dremel without incident. Debride callus with # 15 blade.   Return office visit    12  weeks                  Told patient to return for periodic foot care and evaluation due to potential at risk complications.   Gardiner Barefoot DPM

## 2020-12-01 ENCOUNTER — Telehealth: Payer: Self-pay | Admitting: Family Medicine

## 2020-12-01 NOTE — Telephone Encounter (Signed)
Attempted to call patient to notify of echo results and to inquire about cardiology appointment. No ring, straight to VM. Left HIPPA compliant VM. Referral note reviewed and they too were unable to reach patient on several attempts and referral request closed.   [place], DO 12/01/2020, 9:33 AM PGY-3, [place]

## 2020-12-05 ENCOUNTER — Ambulatory Visit: Payer: Medicare Other | Admitting: Podiatry

## 2020-12-10 ENCOUNTER — Ambulatory Visit (INDEPENDENT_AMBULATORY_CARE_PROVIDER_SITE_OTHER): Payer: Medicare Other | Admitting: Podiatry

## 2020-12-10 ENCOUNTER — Other Ambulatory Visit: Payer: Self-pay

## 2020-12-10 DIAGNOSIS — E1151 Type 2 diabetes mellitus with diabetic peripheral angiopathy without gangrene: Secondary | ICD-10-CM | POA: Diagnosis not present

## 2020-12-10 DIAGNOSIS — M792 Neuralgia and neuritis, unspecified: Secondary | ICD-10-CM

## 2020-12-10 DIAGNOSIS — Z794 Long term (current) use of insulin: Secondary | ICD-10-CM | POA: Diagnosis not present

## 2020-12-10 MED ORDER — PREGABALIN 75 MG PO CAPS: 75.0000 mg | ORAL_CAPSULE | Freq: Two times a day (BID) | ORAL | 0 refills | Status: DC

## 2020-12-10 NOTE — Progress Notes (Signed)
  Subjective:  Patient ID: Rachel Hobbs, female    DOB: Jul 05, 1940,  MRN: [id number]  Chief Complaint  Patient presents with   Numbness    Bilateral foot pain and numbness     80 y.o. female presents with the above complaint.  Patient states that she is getting a lot of neuropathic pain.  Patient states is all the toes.  She has tried taking gabapentin which does help a little bit but not as much.  She would like to know if there is any other treatment options for it.  She denies any other acute complaints.  She is a diabetic with last A1c of 8.4.  She states that she is try to get the sugars under control.  Objective:  Physical Exam: warm, good capillary refill, no trophic changes or ulcerative lesions, weakly palpable DP and PT pulses and onychomycosis x10.  Absent protective sensation..  Assessment:   1. Neuropathic pain   2. Type 2 diabetes mellitus with diabetic peripheral angiopathy without gangrene, with long-term current use of insulin ([place])       Plan:  Patient was evaluated and treated and all questions answered.  Patient educated on diabetes. Discussed proper diabetic foot care and discussed risks and complications of disease. Educated patient in depth on reasons to return to the office immediately should he/she discover anything concerning or new on the feet. All questions answered. Discussed proper shoes as well.   Neuropathic pain -I explained to patient the etiology of neuropathic pain and very treatment options were extensively discussed.  She would like to try Lyrica again.  I discussed the importance of Lyrica.  I sent it over to her pharmacy. .    No follow-ups on file.

## 2020-12-29 ENCOUNTER — Emergency Department (HOSPITAL_COMMUNITY)
Admission: EM | Admit: 2020-12-29 | Discharge: 2020-12-30 | Disposition: A | Discharge status: Home / Self Care | Payer: Medicare Other | Attending: Emergency Medicine | Admitting: Emergency Medicine

## 2020-12-29 ENCOUNTER — Emergency Department (HOSPITAL_COMMUNITY): Payer: Medicare Other

## 2020-12-29 ENCOUNTER — Other Ambulatory Visit: Payer: Self-pay

## 2020-12-29 DIAGNOSIS — I132 Hypertensive heart and chronic kidney disease with heart failure and with stage 5 chronic kidney disease, or end stage renal disease: Secondary | ICD-10-CM | POA: Diagnosis not present

## 2020-12-29 DIAGNOSIS — Z8616 Personal history of COVID-19: Secondary | ICD-10-CM | POA: Diagnosis not present

## 2020-12-29 DIAGNOSIS — E1122 Type 2 diabetes mellitus with diabetic chronic kidney disease: Secondary | ICD-10-CM | POA: Diagnosis not present

## 2020-12-29 DIAGNOSIS — E1140 Type 2 diabetes mellitus with diabetic neuropathy, unspecified: Secondary | ICD-10-CM | POA: Insufficient documentation

## 2020-12-29 DIAGNOSIS — G629 Polyneuropathy, unspecified: Secondary | ICD-10-CM

## 2020-12-29 DIAGNOSIS — Z794 Long term (current) use of insulin: Secondary | ICD-10-CM | POA: Insufficient documentation

## 2020-12-29 DIAGNOSIS — R519 Headache, unspecified: Secondary | ICD-10-CM

## 2020-12-29 DIAGNOSIS — I5030 Unspecified diastolic (congestive) heart failure: Secondary | ICD-10-CM | POA: Insufficient documentation

## 2020-12-29 DIAGNOSIS — Z7982 Long term (current) use of aspirin: Secondary | ICD-10-CM | POA: Insufficient documentation

## 2020-12-29 DIAGNOSIS — N2889 Other specified disorders of kidney and ureter: Secondary | ICD-10-CM

## 2020-12-29 DIAGNOSIS — Z992 Dependence on renal dialysis: Secondary | ICD-10-CM | POA: Diagnosis not present

## 2020-12-29 DIAGNOSIS — N186 End stage renal disease: Secondary | ICD-10-CM | POA: Diagnosis not present

## 2020-12-29 DIAGNOSIS — I151 Hypertension secondary to other renal disorders: Secondary | ICD-10-CM

## 2020-12-29 LAB — COMPREHENSIVE METABOLIC PANEL
ALT: 15 U/L (ref 0–44)
AST: 16 U/L (ref 15–41)
Albumin: 3.5 g/dL (ref 3.5–5.0)
Alkaline Phosphatase: 94 U/L (ref 38–126)
Anion gap: 17 — ABNORMAL HIGH (ref 5–15)
BUN: 27 mg/dL — ABNORMAL HIGH (ref 8–23)
CO2: 31 mmol/L (ref 22–32)
Calcium: 9 mg/dL (ref 8.9–10.3)
Chloride: 89 mmol/L — ABNORMAL LOW (ref 98–111)
Creatinine, Ser: 8.13 mg/dL — ABNORMAL HIGH (ref 0.44–1.00)
GFR, Estimated: 5 mL/min — ABNORMAL LOW (ref >60)
Glucose, Bld: 189 mg/dL — ABNORMAL HIGH (ref 70–99)
Potassium: 3.8 mmol/L (ref 3.5–5.1)
Sodium: 137 mmol/L (ref 135–145)
Total Bilirubin: 0.6 mg/dL (ref 0.3–1.2)
Total Protein: 6.7 g/dL (ref 6.5–8.1)

## 2020-12-29 LAB — CBC WITH DIFFERENTIAL/PLATELET
Abs Immature Granulocytes: 0.02 10*3/uL (ref 0.00–0.07)
Basophils Absolute: 0 10*3/uL (ref 0.0–0.1)
Basophils Relative: 1 %
Eosinophils Absolute: 0.3 10*3/uL (ref 0.0–0.5)
Eosinophils Relative: 5 %
HCT: 36.7 % (ref 36.0–46.0)
Hemoglobin: 11.9 g/dL — ABNORMAL LOW (ref 12.0–15.0)
Immature Granulocytes: 0 %
Lymphocytes Relative: 19 %
Lymphs Abs: 1 10*3/uL (ref 0.7–4.0)
MCH: 34 pg (ref 26.0–34.0)
MCHC: 32.4 g/dL (ref 30.0–36.0)
MCV: 104.9 fL — ABNORMAL HIGH (ref 80.0–100.0)
Monocytes Absolute: 0.5 10*3/uL (ref 0.1–1.0)
Monocytes Relative: 9 %
Neutro Abs: 3.6 10*3/uL (ref 1.7–7.7)
Neutrophils Relative %: 66 %
Platelets: 180 10*3/uL (ref 150–400)
RBC: 3.5 MIL/uL — ABNORMAL LOW (ref 3.87–5.11)
RDW: 14.8 % (ref 11.5–15.5)
WBC: 5.5 10*3/uL (ref 4.0–10.5)
nRBC: 0 % (ref 0.0–0.2)

## 2020-12-29 LAB — CBG MONITORING, ED: Glucose-Capillary: 119 mg/dL — ABNORMAL HIGH (ref 70–99)

## 2020-12-29 NOTE — ED Triage Notes (Signed)
Pt arrived to triage c/o head pressure and neuropathy pain in both feet  Head pressures started about 1 wk ago. No sensitivity to light or sounds    Pt is alert and oriented x4  Pt is ambulatory with cane

## 2020-12-29 NOTE — ED Provider Notes (Addendum)
Emergency Medicine Provider Triage Evaluation Note  Rachel Hobbs , a 80 y.o. female  was evaluated in triage.  Pt complains of headache.  Patient states that she has had a headache for about 2 weeks.  She has never had headaches in the past.  She says that it was onset was gradual.  She describes it as feeling pressure.  Headache is all the time.  Nothing is made it better or worse.  She has tried Tylenol with no relief.  She has associated numbness and tingling in her feet.  Of note, she is a dialysis patient.  She goes on Tuesday, Thursday, Saturday.  Last met on Saturday.  She is a fistula on her left upper arm. Review of Systems  Positive: Headache, numbness and tingling in lower extremities. Negative: Vision changes, dizziness, chest pain, shortness of breath, weakness in extremities.  Physical Exam  BP (!) 189/82 (BP Location: Right Arm)   Pulse 88   Temp 98.2 F (36.8 C) (Oral)   Resp 17   Ht 5' 2"  (1.575 m)   Wt 102.1 kg   SpO2 100%   BMI 41.15 kg/m  Gen:   Awake, no distress  Resp:  Normal effort.  Clear to auscultation MSK:   Moves extremities without difficulty. Other:  Cranial nerves are all intact.  No focal weakness noted on exam.  Decree sensation in bilateral lower extremities.  Heart sounds normal.  Fistula noted on left upper arm.  No bruit noted on exam.  Radial pulses strong bilaterally.  Skin warm.  Medical Decision Making  Medically screening exam initiated at 3:44 PM.  Appropriate orders placed.  Rachel Hobbs was informed that the remainder of the evaluation will be completed by another provider, this initial triage assessment does not replace that evaluation, and the importance of remaining in the ED until their evaluation is complete.   Rachel Birchwood, PA-C 12/29/20 1548    Rachel Hobbs 12/29/20 1549    Rachel Rank, MD 12/29/20 [phone]

## 2020-12-30 LAB — CBG MONITORING, ED: Glucose-Capillary: 110 mg/dL — ABNORMAL HIGH (ref 70–99)

## 2020-12-30 MED ORDER — ACETAMINOPHEN 500 MG PO TABS
1000.0000 mg | ORAL_TABLET | Freq: Once | ORAL | Status: AC
  Administered 2020-12-30: 1000 mg via ORAL
  Filled 2020-12-30: qty 2

## 2020-12-30 MED ORDER — DIPHENHYDRAMINE HCL 50 MG/ML IJ SOLN: 12.5000 mg | Freq: Once | INTRAMUSCULAR | Status: DC

## 2020-12-30 MED ORDER — PROCHLORPERAZINE EDISYLATE 10 MG/2ML IJ SOLN: 10.0000 mg | Freq: Once | INTRAMUSCULAR | Status: DC

## 2020-12-30 NOTE — Discharge Instructions (Signed)
You are seen today for headache.  This is likely related to your blood pressure.  Proceed to dialysis as regularly scheduled.  Take Tylenol for your pain.

## 2020-12-30 NOTE — ED Notes (Signed)
Pt did not want IV and refused medications since she will be going to dialysis this morning. MD Horton made aware.

## 2020-12-30 NOTE — ED Notes (Signed)
Patient verbalizes understanding of d/c instructions. Opportunities for questions and answers were provided. Pt d/c from ED. Pt went to lobby via wheelchair, family picking pt up.

## 2020-12-30 NOTE — ED Provider Notes (Signed)
[facility] EMERGENCY DEPARTMENT Provider Note   CSN: [id number] Arrival date & time: 12/29/20  1327     History Chief Complaint  Patient presents with   Headache    Rachel Hobbs is a 80 y.o. female.  HPI    This is a 80 year old female with a history of CHF, diabetes, hypertension, hyperlipidemia, end-stage renal disease on dialysis, Tuesday Thursday, and Saturday who presents with headache.  Patient reports 1 week history of headache.  She describes it as throbbing.  She does not note that her headache is better or worse as she approaches her need for dialysis.  She states that at baseline she has some blurry vision but this is not new.  She denies any weakness, numbness, tingling, strokelike symptoms.  She is due for dialysis later this morning.  Patient states she does not currently take any blood pressure medications because "my blood pressure drops so much at dialysis."  She denies any fevers.  Patient does report ongoing issues with neuropathy.  She previously took gabapentin but did not like the side effects. Past Medical History:  Diagnosis Date   Anemia of renal disease 06/16/2006   Qualifier: Diagnosis of  By: Marinell Blight, Dawn     Arthritis    CHF (congestive heart failure) ([place])    Depression    Diabetes mellitus    type 2   Hernia    Hyperlipidemia    Hypertension    Iron deficiency anemia    Low iron    Nodule of soft tissue 06/22/2012   Peripheral vascular disease (HCC)    in legs   Pneumonia    teenager   Renal disorder    CKD - dialysis T/TH/Sa   Shortness of breath dyspnea    with exertion   SMALL BOWEL OBSTRUCTION, HX OF 02/15/2007   Qualifier: Diagnosis of  By: [name], HEIDI      Patient Active Problem List   Diagnosis Date Noted   Acrochordon 03/31/2020   Head mass 11/12/2019   Nuclear sclerotic cataract of both eyes 07/23/2019   Cortical age-related cataract of both eyes 07/23/2019   Posterior vitreous detachment of  left eye 07/23/2019   Posterior vitreous detachment of right eye 07/23/2019   Moderate nonproliferative diabetic retinopathy of both eyes without macular edema associated with type 2 diabetes mellitus ([place]) 07/23/2019   Moderate protein-calorie malnutrition ([place]) 05/20/2019   COVID-19 04/22/2019   Allergy, unspecified, initial encounter 02/05/2019   Impacted cerumen of left ear 11/03/2018   Seasonal allergic rhinitis 11/03/2018   Trigger ring finger of right hand 07/10/2018   Painful diabetic neuropathy ([place]) 02/24/2018    Class: Chronic   Lichen planus 22/63/3354   Hypercalcemia 02/03/2017   Pain, unspecified 09/22/2016   Healthcare maintenance 08/23/2016   Thrombocytopenia, unspecified ([place]) 05/10/2016   Hyperglycemia, unspecified 12/17/2015   Anemia in chronic kidney disease 12/16/2015   Diarrhea, unspecified 12/16/2015   Fever, unspecified 12/16/2015   Iron deficiency anemia, unspecified 12/16/2015   Other specified coagulation defects ([place]) 12/16/2015   Pruritus, unspecified 12/16/2015   Secondary hyperparathyroidism of renal origin ([place]) 12/16/2015   Type 2 diabetes mellitus with diabetic peripheral angiopathy without gangrene ([place]) 12/16/2015   Type II diabetes mellitus with end-stage renal disease ([place])    Chronic constipation 56/25/6389   Diastolic CHF ([place]) 37/34/2876   End stage renal disease ([place]) 06/14/2011   GOUT, ACUTE 11/03/2009   FOOT PAIN, RIGHT 12/11/2008   LEG EDEMA, BILATERAL 12/03/2008  Other specified abdominal hernia without obstruction or gangrene 07/10/2007   OSTEOARTHROSIS, LOCAL NOS, OTHER SPEC SITE 12/08/2006   GLAUCOMA 08/17/2006   HAIR LOSS 08/17/2006   DM type 2, controlled, with complication ([place]) 02/63/7858   HLD (hyperlipidemia) 06/16/2006   Morbid obesity ([place]) 06/16/2006   Anemia of renal disease 06/16/2006    Class: Chronic   HYPERTENSION, BENIGN SYSTEMIC 06/16/2006   EDEMA-LEGS,DUE TO VENOUS OBSTRUCT. 06/16/2006    Past Surgical  History:  Procedure Laterality Date   ABDOMINAL HYSTERECTOMY     in the 70's   AV FISTULA PLACEMENT Left 12/05/2015   Procedure: RADIOCEPHALIC VERSUS BRACHIOCEPHALIC ARTERIOVENOUS (AV) FISTULA CREATION;  Surgeon: [name], MD;  Location: [facility] OR;  Service: Vascular;  Laterality: Left;   AV FISTULA PLACEMENT Left 05/20/2017   Procedure: INSERTION OF ARTERIOVENOUS (AV) GORE-TEX VASCULAR STRETCH 4-7 GRAFT ARM LEFT UPPER ARM;  Surgeon: [name], MD;  Location: [facility];  Service: Vascular;  Laterality: Left;   [place] Left 06/04/2016   Procedure: FIRST STAGE BASILIC VEIN TRANSPOSITION;  Surgeon: [name], MD;  Location: [place];  Service: Vascular;  Laterality: Left;   [place] Left 08/04/2016   Procedure: LEFT UPPER ARM [place], SECOND STAGE;  Surgeon: [name], MD;  Location: [place];  Service: Vascular;  Laterality: Left;   HERNIA REPAIR     umbilical in the 85'O   INSERTION OF DIALYSIS CATHETER Right 12/05/2015   Procedure: INSERTION OF DIALYSIS CATHETER;  Surgeon: [name], MD;  Location: [facility];  Service: Vascular;  Laterality: Right;   IR THROMBECTOMY AV FISTULA W/THROMBOLYSIS/PTA INC/SHUNT/IMG LEFT Left 05/09/2019   IR US GUIDE VASC ACCESS LEFT  05/09/2019     OB History   No obstetric history on file.     Family History  Problem Relation Age of Onset   Kidney disease Mother    Hypertension Mother    COPD Father        smoke   Cancer Father        Lung   Hypertension Father    Kidney disease Sister    Diabetes Daughter     Social History   Tobacco Use   Smoking status: Never   Smokeless tobacco: Never  Vaping Use   Vaping Use: Never used  Substance Use Topics   Alcohol use: No   Drug use: No    Home Medications Prior to Admission medications   Medication Sig Start Date End Date Taking? Authorizing Provider  acetaminophen (TYLENOL) 500 MG tablet Take 500 mg by mouth every 6 (six) hours  as needed for mild pain or headache.    [provider]  aspirin 81 MG tablet Take 81 mg by mouth daily.    [provider]  Aspirin-Calcium Carbonate 81-777 MG TABS Take by mouth.    [provider]  atorvastatin (LIPITOR) 40 MG tablet TAKE 1 TABLET(40 MG) BY MOUTH DAILY AT 6 PM 06/25/20   Maness, Arnette Norris, MD  B Complex-C-Folic Acid (NEPHRO VITAMINS) 0.8 MG TABS Take 1 tablet by mouth daily.    [provider]  bisacodyl (DULCOLAX) 5 MG EC tablet Take 5 mg by mouth daily as needed for moderate constipation.    [provider]  Blood Glucose Monitoring Suppl (BLOOD GLUCOSE METER) kit Use as instructed 05/09/13   Olam Idler, MD  Blood Glucose Monitoring Suppl [facility] VERIO) w/Device KIT Use daily as indicated 02/24/18   Marjie Skiff, MD  calcium acetate (PHOSLO) 667 MG capsule Take 667-1,334 mg by mouth See admin instructions. Take 1334 mg with each meal and 667 mg with each snack 05/10/16   [provider]  ethyl chloride spray  08/10/17   [provider]  gabapentin (NEURONTIN) 100 MG capsule Take 1 capsule (100 mg total) by mouth 3 (three) times daily. Take 100 mg in morning, and 200 mg at night 03/31/20   Maness, Philip, MD  glucose blood (ONETOUCH VERIO) test strip Use as instructed 02/24/18   Diallo, Earna Coder, MD  Insulin Pen Needle 31G X 8 MM MISC BD UltraFine III Pen Needles. For use with insulin pen device. Inject insulin 2X daily 05/05/18   Zenia Resides, MD  Lancets [facility] ULTRASOFT) lancets Once daily testing plus prn for hypoglycemia 05/09/13   Olam Idler, MD  LANTUS SOLOSTAR 100 UNIT/ML Solostar Pen Inject 50 Units into the skin at bedtime. 11/03/20   Autry-Lott, Naaman Plummer, DO  Methoxy PEG-Epoetin Beta (MIRCERA IJ) Mircera 09/27/19 01/29/21  [provider]  Multiple Vitamins-Minerals (QUIN B STRONG) TABS Take by mouth.    [provider]  NEEDLE, DISP, 30 G (B-D DISP NEEDLE 30GX1") 30G X 1" MISC  1 each by Does not apply route daily. 05/03/14   Olam Idler, MD  ONE TOUCH ULTRA TEST test strip TEST BLOOD SUGAR EVERY DAY AND AS NEEDED FOR SYMPTOMS OF HYPOGLYCEMIA 08/12/16   Vivi Barrack, MD  [facility] DELICA LANCETS 77O MISC Use daily as indicated 02/24/18   Diallo, Earna Coder, MD  pregabalin (LYRICA) 75 MG capsule Take 1 capsule (75 mg total) by mouth 2 (two) times daily. 12/10/20   Felipa Furnace, DPM    Allergies    Patient has no known allergies.  Review of Systems   Review of Systems  Constitutional:  Negative for fever.  Eyes:  Positive for visual disturbance. Negative for photophobia.  Respiratory:  Negative for shortness of breath.   Cardiovascular:  Negative for chest pain.  Gastrointestinal:  Negative for abdominal pain, nausea and vomiting.  Neurological:  Positive for headaches.  All other systems reviewed and are negative.  Physical Exam Updated Vital Signs BP (!) 186/71 (BP Location: Right Arm)   Pulse 68   Temp 97.7 F (36.5 C)   Resp 16   Ht 1.575 m (5' 2" )   Wt 102.1 kg   SpO2 100%   BMI 41.15 kg/m   Physical Exam Vitals and nursing note reviewed.  Constitutional:      Appearance: She is well-developed. She is obese. She is not ill-appearing.  HENT:     Head: Normocephalic and atraumatic.     Mouth/Throat:     Mouth: Mucous membranes are moist.  Eyes:     Pupils: Pupils are equal, round, and reactive to light.  Cardiovascular:     Rate and Rhythm: Normal rate and regular rhythm.     Heart sounds: Normal heart sounds.  Pulmonary:     Effort: Pulmonary effort is normal. No respiratory distress.     Breath sounds: No wheezing.  Abdominal:     General: Bowel sounds are normal.     Palpations: Abdomen is soft.     Comments: Abdominal wall hernia  Musculoskeletal:     Cervical back: Normal range of motion and neck supple.  Skin:    General: Skin is warm and dry.  Neurological:     Mental Status: She is alert and oriented to person, place, and  time.  Comments: Cranial nerves II through XII intact, 5 out of 5 strength in all 4 extremities, no dysmetria to finger-nose-finger  Psychiatric:        Mood and Affect: Mood normal.    ED Results / Procedures / Treatments   Labs (all labs ordered are listed, but only abnormal results are displayed) Labs Reviewed  COMPREHENSIVE METABOLIC PANEL - Abnormal; Notable for the following components:      Result Value   Chloride 89 (*)    Glucose, Bld 189 (*)    BUN 27 (*)    Creatinine, Ser 8.13 (*)    GFR, Estimated 5 (*)    Anion gap 17 (*)    All other components within normal limits  CBC WITH DIFFERENTIAL/PLATELET - Abnormal; Notable for the following components:   RBC 3.50 (*)    Hemoglobin 11.9 (*)    MCV 104.9 (*)    All other components within normal limits  CBG MONITORING, ED - Abnormal; Notable for the following components:   Glucose-Capillary 119 (*)    All other components within normal limits  CBG MONITORING, ED - Abnormal; Notable for the following components:   Glucose-Capillary 110 (*)    All other components within normal limits    EKG None  Radiology CT Head Wo Contrast  Result Date: 12/29/2020 CLINICAL DATA:  Headache EXAM: CT HEAD WITHOUT CONTRAST TECHNIQUE: Contiguous axial images were obtained from the base of the skull through the vertex without intravenous contrast. COMPARISON:  None. FINDINGS: Brain: No acute intracranial abnormality. Specifically, no hemorrhage, hydrocephalus, mass lesion, acute infarction, or significant intracranial injury. Vascular: No hyperdense vessel or unexpected calcification. Skull: No acute calvarial abnormality. Sinuses/Orbits: Visualized paranasal sinuses and mastoids clear. Orbital soft tissues unremarkable. Other: None IMPRESSION: No acute intracranial abnormality. Electronically Signed   By: Rolm Baptise M.D.   On: 12/29/2020 16:30    Procedures Procedures   Medications Ordered in ED Medications  prochlorperazine  (COMPAZINE) injection 10 mg (10 mg Intravenous Not Given 12/30/20 0518)  diphenhydrAMINE (BENADRYL) injection 12.5 mg (12.5 mg Intravenous Not Given 12/30/20 0518)  acetaminophen (TYLENOL) tablet 1,000 mg (1,000 mg Oral Given 12/30/20 0515)    ED Course  I have reviewed the triage vital signs and the nursing notes.  Pertinent labs & imaging results that were available during my care of the patient were reviewed by me and considered in my medical decision making (see chart for details).    MDM Rules/Calculators/A&P                            Patient presents with headache.  Ongoing for the last week or so.  She is nontoxic.  Vital signs notable for high blood pressure.  Initially 189/82.  It did trend down but then trended back up to 204/62.  She does not take regular blood pressure medications because her pressure tends to drop with dialysis.  She is due for dialysis later this morning.  No red flags.  Neurologic exam is normal.  No focal deficits.  CT scan ordered from triage reviewed and shows no obvious mass lesions or bleed.  Labs are close to the patient's baseline.  Migraine cocktail was ordered.  Patient refused.  She would only consent to Tylenol because she did not want to be somnolent.  She did have some improvement with Tylenol.  Patient has some poor improvement with Tylenol.  She has no signs or symptoms of hypertensive urgency or  emergency.  Would likely benefit from her regularly scheduled dialysis later today.  She is agreeable to plan.  Recommend that she trial gabapentin again for her neuropathy but at lower dose.  After history, exam, and medical workup I feel the patient has been appropriately medically screened and is safe for discharge home. Pertinent diagnoses were discussed with the patient. Patient was given return precautions.   Final Clinical Impression(s) / ED Diagnoses Final diagnoses:  Acute nonintractable headache, unspecified headache type  Hypertension  secondary to other renal disorders  Neuropathy    Rx / DC Orders ED Discharge Orders     None        Merryl Hacker, MD 12/30/20 [phone]

## 2021-01-01 ENCOUNTER — Ambulatory Visit: Payer: Medicare Other

## 2021-01-07 NOTE — Progress Notes (Deleted)
    SUBJECTIVE:   CHIEF COMPLAINT / HPI:   ***  11/03/20 Dr Janus Molder Head mass Seen for this several months prior does not feel as if it is enlarging but continues to bother patient.  Admits that it has changed in appearance.  It is not painful.  Would like to be seen in our dermatology clinic.     PERTINENT  PMH / PSH: ***  OBJECTIVE:   There were no vitals taken for this visit.  ***  ASSESSMENT/PLAN:   No problem-specific Assessment & Plan notes found for this encounter.     Rachel Essex, MD [place]

## 2021-01-08 ENCOUNTER — Ambulatory Visit: Payer: Medicare Other

## 2021-01-30 ENCOUNTER — Ambulatory Visit (INDEPENDENT_AMBULATORY_CARE_PROVIDER_SITE_OTHER): Payer: Medicare Other | Admitting: Student

## 2021-01-30 ENCOUNTER — Encounter: Payer: Self-pay | Admitting: Student

## 2021-01-30 ENCOUNTER — Other Ambulatory Visit: Payer: Self-pay

## 2021-01-30 VITALS — BP 181/80 | HR 91

## 2021-01-30 DIAGNOSIS — J019 Acute sinusitis, unspecified: Secondary | ICD-10-CM | POA: Insufficient documentation

## 2021-01-30 DIAGNOSIS — J0100 Acute maxillary sinusitis, unspecified: Secondary | ICD-10-CM | POA: Diagnosis not present

## 2021-01-30 MED ORDER — OXYMETAZOLINE HCL 0.05 % NA SOLN: 1.0000 | Freq: Once | NASAL | Status: AC

## 2021-01-30 MED ORDER — AMOXICILLIN 500 MG PO TABS: 500.0000 mg | ORAL_TABLET | Freq: Two times a day (BID) | ORAL | 0 refills | Status: AC

## 2021-01-30 NOTE — Patient Instructions (Addendum)
It was great to see you! Thank you for allowing me to participate in your care!  Your symptoms are likely due to sinusitis- inflammation and congestion of your sinuses.  Start using Afrin nose spray- one spray per nostril each day for 3-5 days maximum. Spray toward the ear, away from the nasal septum.  If you are not feeling better by Monday (10/17), I have sent in an antibiotic for you to start taking. (Amoxicillin). Follow the instructions accordingly.  Best wishes,  Dr. Orvis Brill, [place] [phone]

## 2021-01-30 NOTE — Progress Notes (Signed)
    SUBJECTIVE:   CHIEF COMPLAINT / HPI:   Sinus congestion/pressure Patient has had maxillary pressure for 2 weeks with congestion and runny nose and left ear pressure. No nausea, vomiting, fever, chills. She is UTD on COVID vaccinations and will get her flu vaccine next week. She was seen at the ED on 12/29/2020 for acute nonintractable headache. She had a CT scan that showed no acute abnormalities. Since then, she has had continuous pain.  Visual floaters She has had "strings" of floaters and blacks pots in her vision that is not new. She follows outpatient with retina specialist Dr. Zadie Rhine for cataracts and retinopathy 2/2 diabetes. She had many questions about if she should have cataract surgery, to which I said she should defer to her specialist.  PERTINENT  PMH / [place]: Type 2 DM, HTN, ESRD on HD, Diabetic retinopathy of both eyes  OBJECTIVE:   BP (!) 181/80   Pulse 91   SpO2 98%   General: Elderly female sitting in wheelchair  HEENT: Rhinorrhea, TM normal bilaterally without bulging, erythema or exudate. No pharyngeal erythema. Mild pain to palpation over maxillary sinuses. Bilateral eyes with clear discharge and conjunctival injection. CV: RRR, murmur heard from AV fistula Pulm: Clear in all fields, no wheezing or crackles Skin: AV fistula in left arm with palpable thrill    ASSESSMENT/PLAN:   Acute non-recurrent sinusitis Likely sinusitis given location of pressure, as opposed to pain, in maxillary region and forehead. No systemic symptoms like fever, chills, nausea, vomiting. UTD on COVID vaccinations and flu vaccine. - Start Afrin for 3-5 days for symptomatic improvement. - Given guidelines (>10 days)  prescribed Amoxicillin to be taken if symptoms not improved by 10/17.     Orvis Brill, [place]

## 2021-01-30 NOTE — Assessment & Plan Note (Signed)
Likely sinusitis given location of pressure, as opposed to pain, in maxillary region and forehead. No systemic symptoms like fever, chills, nausea, vomiting. UTD on COVID vaccinations and flu vaccine. - Start Afrin for 3-5 days for symptomatic improvement. - Given guidelines (>10 days)  prescribed Amoxicillin to be taken if symptoms not improved by 10/17.

## 2021-03-02 ENCOUNTER — Ambulatory Visit: Payer: Medicare Other | Admitting: Podiatry

## 2021-03-04 ENCOUNTER — Encounter: Payer: Self-pay | Admitting: Neurology

## 2021-03-10 ENCOUNTER — Other Ambulatory Visit: Payer: Self-pay | Admitting: *Deleted

## 2021-03-10 DIAGNOSIS — T782XXA Anaphylactic shock, unspecified, initial encounter: Secondary | ICD-10-CM | POA: Insufficient documentation

## 2021-03-10 DIAGNOSIS — E118 Type 2 diabetes mellitus with unspecified complications: Secondary | ICD-10-CM

## 2021-03-10 MED ORDER — ATORVASTATIN CALCIUM 40 MG PO TABS: ORAL_TABLET | ORAL | 3 refills | Status: DC

## 2021-03-11 ENCOUNTER — Ambulatory Visit (INDEPENDENT_AMBULATORY_CARE_PROVIDER_SITE_OTHER): Payer: Medicare Other | Admitting: Podiatry

## 2021-03-11 ENCOUNTER — Other Ambulatory Visit: Payer: Self-pay

## 2021-03-11 DIAGNOSIS — M79609 Pain in unspecified limb: Secondary | ICD-10-CM

## 2021-03-11 DIAGNOSIS — L84 Corns and callosities: Secondary | ICD-10-CM

## 2021-03-11 DIAGNOSIS — M79676 Pain in unspecified toe(s): Secondary | ICD-10-CM | POA: Diagnosis not present

## 2021-03-11 DIAGNOSIS — B351 Tinea unguium: Secondary | ICD-10-CM

## 2021-03-11 NOTE — Progress Notes (Signed)
This patient returns to my office for at risk foot care.  This patient requires this care by a professional since this patient will be at risk due to having chronic kidney disease and diabetes type 2.    This patient is unable to cut nails herself since the patient cannot reach her nails.These nails are painful walking and wearing shoes. Patient has painful callus on her bunion left foot. This patient presents for at risk foot care today.   General Appearance  Alert, conversant and in no acute stress.  Vascular  Dorsalis pedis and posterior tibial  pulses are not  palpable  bilaterally.  Capillary return is within normal limits  bilaterally. Temperature is within normal limits  Bilaterally.  Swelling bilaterally.  Neurologic  Senn-Weinstein monofilament wire test diminished  bilaterally. Muscle power within normal limits bilaterally.  Nails Thick disfigured discolored nails with subungual debris  from hallux to fifth toes bilaterally. No evidence of bacterial infection or drainage bilaterally.  Orthopedic  No limitations of motion  feet .  No crepitus or effusions noted.  No bony pathology or digital deformities noted. HAV  B/L.  Skin  normotropic skin with no porokeratosis noted bilaterally.  No signs of infections or ulcers noted.   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  Consent was obtained for treatment procedures.   Mechanical debridement of nails 1-5  bilaterally performed with a nail nipper.  Filed with dremel without incident. Debride callus with # 15 blade.   Return office visit    12  weeks                  Told patient to return for periodic foot care and evaluation due to potential at risk complications.   Gardiner Barefoot DPM

## 2021-03-27 ENCOUNTER — Ambulatory Visit: Payer: Medicare Other | Admitting: Neurology

## 2021-03-30 ENCOUNTER — Ambulatory Visit (INDEPENDENT_AMBULATORY_CARE_PROVIDER_SITE_OTHER): Payer: Medicare Other | Admitting: Ophthalmology

## 2021-03-30 ENCOUNTER — Encounter (INDEPENDENT_AMBULATORY_CARE_PROVIDER_SITE_OTHER): Payer: Self-pay | Admitting: Ophthalmology

## 2021-03-30 ENCOUNTER — Other Ambulatory Visit: Payer: Self-pay

## 2021-03-30 DIAGNOSIS — H401314 Pigmentary glaucoma, right eye, indeterminate stage: Secondary | ICD-10-CM | POA: Diagnosis not present

## 2021-03-30 DIAGNOSIS — H2513 Age-related nuclear cataract, bilateral: Secondary | ICD-10-CM

## 2021-03-30 DIAGNOSIS — E113393 Type 2 diabetes mellitus with moderate nonproliferative diabetic retinopathy without macular edema, bilateral: Secondary | ICD-10-CM | POA: Diagnosis not present

## 2021-03-30 DIAGNOSIS — H4010X4 Unspecified open-angle glaucoma, indeterminate stage: Secondary | ICD-10-CM | POA: Insufficient documentation

## 2021-03-30 NOTE — Assessment & Plan Note (Signed)
I recommend follow-up within 3 to 4 months for the patient for the cataract but with the intraocular pressure elevation I recommend follow-up within few weeks with [facility]

## 2021-03-30 NOTE — Assessment & Plan Note (Signed)
Healthy optic nerve OD thus no emergency however I do recommend follow-up in the coming 2 to 3 weeks with [facility]

## 2021-03-30 NOTE — Progress Notes (Signed)
03/30/2021     CHIEF COMPLAINT Patient presents for  Chief Complaint  Patient presents with   Retina Follow Up    6 Mo F/U OU  Pt reports floaters off and on OU. Pt c/o itching and watering OU. VA stable OU. A1c: 8.1, 3 mo ago LBS: 125 3 wks ago      HISTORY OF PRESENT ILLNESS: Rachel Hobbs is a 80 y.o. female who presents to the clinic today for:   HPI     Retina Follow Up           Diagnosis: Diabetic Retinopathy   Laterality: both eyes   Onset: 6 months ago   Severity: mild   Duration: 6 months   Course: stable   Comments: 6 Mo F/U OU  Pt reports floaters off and on OU. Pt c/o itching and watering OU. VA stable OU. A1c: 8.1, 3 mo ago LBS: 125 3 wks ago         Comments   6 mos fu OU oct. Pt states she notices a change in the left eye. Pt states floaters and strands have worsened in the left eye. Onset since 3 weeks ago. Pt denies new FOL.      Last edited by Laurin Coder on 03/30/2021  1:29 PM.      Referring physician: Clent Jacks, MD [address] [place],  [place] [address]  HISTORICAL INFORMATION:   Selected notes from the MEDICAL RECORD NUMBER    Lab Results  Component Value Date   HGBA1C 8.5 (A) 11/03/2020     CURRENT MEDICATIONS: No current outpatient medications on file. (Ophthalmic Drugs)   No current facility-administered medications for this visit. (Ophthalmic Drugs)   Current Outpatient Medications (Other)  Medication Sig   acetaminophen (TYLENOL) 500 MG tablet Take 500 mg by mouth every 6 (six) hours as needed for mild pain or headache.   aspirin 81 MG tablet Take 81 mg by mouth daily.   Aspirin-Calcium Carbonate 81-777 MG TABS Take by mouth.   atorvastatin (LIPITOR) 40 MG tablet TAKE 1 TABLET(40 MG) BY MOUTH DAILY AT 6 PM Strength: 40 mg   B Complex-C-Folic Acid (NEPHRO VITAMINS) 0.8 MG TABS Take 1 tablet by mouth daily.   bisacodyl (DULCOLAX) 5 MG EC tablet Take 5 mg by mouth daily as needed for moderate  constipation.   Blood Glucose Monitoring Suppl (BLOOD GLUCOSE METER) kit Use as instructed   Blood Glucose Monitoring Suppl (ONETOUCH VERIO) w/Device KIT Use daily as indicated   calcium acetate (PHOSLO) 667 MG capsule Take 667-1,334 mg by mouth See admin instructions. Take 1334 mg with each meal and 667 mg with each snack   ethyl chloride spray    gabapentin (NEURONTIN) 100 MG capsule Take 1 capsule (100 mg total) by mouth 3 (three) times daily. Take 100 mg in morning, and 200 mg at night   glucose blood (ONETOUCH VERIO) test strip Use as instructed   Insulin Pen Needle 31G X 8 MM MISC BD UltraFine III Pen Needles. For use with insulin pen device. Inject insulin 2X daily   Lancets (ONETOUCH ULTRASOFT) lancets Once daily testing plus prn for hypoglycemia   LANTUS SOLOSTAR 100 UNIT/ML Solostar Pen Inject 50 Units into the skin at bedtime.   Multiple Vitamins-Minerals (QUIN B STRONG) TABS Take by mouth.   NEEDLE, DISP, 30 G (B-D DISP NEEDLE 30GX1") 30G X 1" MISC 1 each by Does not apply route daily.   ONE TOUCH ULTRA  TEST test strip TEST BLOOD SUGAR EVERY DAY AND AS NEEDED FOR SYMPTOMS OF HYPOGLYCEMIA   ONETOUCH DELICA LANCETS 55V MISC Use daily as indicated   pregabalin (LYRICA) 75 MG capsule Take 1 capsule (75 mg total) by mouth 2 (two) times daily.   Current Facility-Administered Medications (Other)  Medication Route   oxymetazoline (AFRIN) 0.05 % nasal spray 1 spray Each Nare      REVIEW OF SYSTEMS:    ALLERGIES No Known Allergies  PAST MEDICAL HISTORY Past Medical History:  Diagnosis Date   Anemia of renal disease 06/16/2006   Qualifier: Diagnosis of  By: Marinell Blight, Dawn     Arthritis    CHF (congestive heart failure) ([place])    Depression    Diabetes mellitus    type 2   Hernia    Hyperlipidemia    Hypertension    Iron deficiency anemia    Low iron    Nodule of soft tissue 06/22/2012   Peripheral vascular disease (HCC)    in legs   Pneumonia    teenager   Renal  disorder    CKD - dialysis T/TH/Sa   Shortness of breath dyspnea    with exertion   SMALL BOWEL OBSTRUCTION, HX OF 02/15/2007   Qualifier: Diagnosis of  By: [name], HEIDI     Past Surgical History:  Procedure Laterality Date   ABDOMINAL HYSTERECTOMY     in the 60's   AV FISTULA PLACEMENT Left 12/05/2015   Procedure: RADIOCEPHALIC VERSUS BRACHIOCEPHALIC ARTERIOVENOUS (AV) FISTULA CREATION;  Surgeon: [name], MD;  Location: [facility] OR;  Service: Vascular;  Laterality: Left;   AV FISTULA PLACEMENT Left 05/20/2017   Procedure: INSERTION OF ARTERIOVENOUS (AV) GORE-TEX VASCULAR STRETCH 4-7 GRAFT ARM LEFT UPPER ARM;  Surgeon: [name], MD;  Location: [facility] OR;  Service: Vascular;  Laterality: Left;   [place] Left 06/04/2016   Procedure: FIRST STAGE BASILIC VEIN TRANSPOSITION;  Surgeon: [name], MD;  Location: [facility] OR;  Service: Vascular;  Laterality: Left;   [place] Left 08/04/2016   Procedure: LEFT UPPER ARM [place], SECOND STAGE;  Surgeon: [name], MD;  Location: [facility] OR;  Service: Vascular;  Laterality: Left;   HERNIA REPAIR     umbilical in the 74'M   INSERTION OF DIALYSIS CATHETER Right 12/05/2015   Procedure: INSERTION OF [place];  Surgeon: [name], MD;  [facility];  Service: Vascular;  Laterality: Right;   IR THROMBECTOMY AV FISTULA W/THROMBOLYSIS/PTA INC/SHUNT/IMG LEFT Left 05/09/2019   IR US GUIDE VASC ACCESS LEFT  05/09/2019    FAMILY HISTORY Family History  Problem Relation Age of Onset   Kidney disease Mother    Hypertension Mother    COPD Father        smoke   Cancer Father        Lung   Hypertension Father    Kidney disease Sister    Diabetes Daughter     SOCIAL HISTORY Social History   Tobacco Use   Smoking status: Never   Smokeless tobacco: Never  Vaping Use   Vaping Use: Never used  Substance Use Topics   Alcohol use: No   Drug use: No         OPHTHALMIC  EXAM:  Base Eye Exam     Visual Acuity (ETDRS)       Right Left   Dist [place] 20/40 -1 20/40 +2   Dist [place]  20/30 -2 20/25 -2         Tonometry (Tonopen, 1:27 PM)       Right Left   Pressure 31 17         Tonometry #2 (Tonopen, 1:27 PM)       Right Left   Pressure 24 18         Tonometry Comments   Pt blinking, squeezing eyes closed and looking away.        Pupils       Pupils Dark Light React APD   Right PERRL 2 2 Minimal None   Left PERRL 2 2 Minimal None         Extraocular Movement       Right Left    Full Full         Neuro/Psych     Oriented x3: Yes   Mood/Affect: Normal         Dilation     Both eyes: 1.0% Mydriacyl, 2.5% Phenylephrine @ 1:27 PM           Slit Lamp and Fundus Exam     External Exam       Right Left   External Normal Normal         Slit Lamp Exam       Right Left   Lids/Lashes Normal Normal   Conjunctiva/Sclera White and quiet White and quiet   Cornea Clear Clear   Anterior Chamber Narrow angle,,at 1/2 ct depth Deep and quiet.. narrow, at 1/2 ac deptha   Iris Round and reactive Round and reactive   Lens 3+ Nuclear sclerosis, 3+ Cortical cataract 2+ Cortical cataract, 2.5+ Nuclear sclerosis   Anterior Vitreous Posterior vitreous detachment Normal         Fundus Exam       Right Left   Posterior Vitreous Posterior vitreous detachment Normal   Disc Normal Normal   C/D Ratio 0.3 0.25   Macula Normal Normal, no macular thickening, no clinically significant macular edema   Vessels Moderate NPDR Moderate NPDR   Periphery Normal Normal            IMAGING AND PROCEDURES  Imaging and Procedures for 03/30/21  OCT, Retina - OU - Both Eyes       Right Eye Quality was good. Scan locations included subfoveal. Central Foveal Thickness: 267. Progression has been stable. Findings include normal foveal contour.   Left Eye Quality was good. Scan locations included subfoveal. Central Foveal  Thickness: 283. Progression has been stable. Findings include cystoid macular edema.   Notes Moderate nonproliferative diabetic retinopathy with no active maculopathy OU   OD, no active maculopathy             ASSESSMENT/PLAN:  Moderate nonproliferative diabetic retinopathy of both eyes without macular edema associated with type 2 diabetes mellitus ([place]) Appears present but I suspect it may be earlier severe NPDR present with 20 diopter examination but the view is limited by fairly significant cataract now OU  Nuclear sclerotic cataract of both eyes I recommend follow-up within 3 to 4 months for the patient for the cataract but with the intraocular pressure elevation I recommend follow-up within few weeks with [facility]  Open-angle glaucoma of right eye, indeterminate stage Healthy optic nerve OD thus no emergency however I do recommend follow-up in the coming 2 to 3 weeks with [facility]     ICD-10-CM   1. Moderate nonproliferative diabetic retinopathy of both eyes without macular  edema associated with type 2 diabetes mellitus (HCC)  H68.0881 OCT, Retina - OU - Both Eyes    2. Nuclear sclerotic cataract of both eyes  H25.13     3. Pigmentary glaucoma of right eye, indeterminate stage  H40.1314       1.  OU with moderate nonproliferative diabetic retinopathy with all I suspect there is some conversion to early severe NPDR.  No signs of media opacity beyond the dense cataract progression in each eye hampering some ability to monitor retinopathy with 20 diopter lens in the wheelchair setting  2.  Unilateral elevated intraocular pressure continues to occur in the right eye.  Needs follow-up with [facility] regarding this issue  3.  Discussed with the patient the potential benefit for cataract surgery to maximize visual potential but also to medically monitor the retinopathy in each eye  Ophthalmic Meds Ordered this visit:  No orders of the defined types were placed  in this encounter.      Return in about 4 months (around 07/29/2021) for COLOR FP.  There are no Patient Instructions on file for this visit.   Explained the diagnoses, plan, and follow up with the patient and they expressed understanding.  Patient expressed understanding of the importance of proper follow up care.   Clent Demark Lakhia Gengler M.D. Diseases & Surgery of the Retina and Vitreous [facility] [place] 03/30/21     Abbreviations: M myopia (nearsighted); A astigmatism; H hyperopia (farsighted); P presbyopia; Mrx spectacle prescription;  CTL contact lenses; OD right eye; OS left eye; OU both eyes  XT exotropia; ET esotropia; PEK punctate epithelial keratitis; PEE punctate epithelial erosions; DES dry eye syndrome; MGD meibomian gland dysfunction; ATs artificial tears; PFAT's preservative free artificial tears; [place] nuclear sclerotic cataract; PSC posterior subcapsular cataract; ERM epi-retinal membrane; PVD posterior vitreous detachment; RD retinal detachment; DM diabetes mellitus; DR diabetic retinopathy; NPDR non-proliferative diabetic retinopathy; PDR proliferative diabetic retinopathy; CSME clinically significant macular edema; DME diabetic macular edema; dbh dot blot hemorrhages; CWS cotton wool spot; POAG primary open angle glaucoma; C/D cup-to-disc ratio; HVF humphrey visual field; GVF goldmann visual field; OCT optical coherence tomography; IOP intraocular pressure; BRVO Branch retinal vein occlusion; CRVO central retinal vein occlusion; CRAO central retinal artery occlusion; BRAO branch retinal artery occlusion; RT retinal tear; SB scleral buckle; PPV pars plana vitrectomy; VH Vitreous hemorrhage; PRP panretinal laser photocoagulation; IVK intravitreal kenalog; VMT vitreomacular traction; MH Macular hole;  NVD neovascularization of the disc; NVE neovascularization elsewhere; AREDS age related eye disease study; ARMD age related macular degeneration; POAG primary open angle glaucoma;  EBMD epithelial/anterior basement membrane dystrophy; ACIOL anterior chamber intraocular lens; IOL intraocular lens; PCIOL posterior chamber intraocular lens; Phaco/IOL phacoemulsification with intraocular lens placement; [place] photorefractive keratectomy; LASIK laser assisted in situ keratomileusis; HTN hypertension; DM diabetes mellitus; COPD chronic obstructive pulmonary disease

## 2021-03-30 NOTE — Assessment & Plan Note (Signed)
Appears present but I suspect it may be earlier severe NPDR present with 20 diopter examination but the view is limited by fairly significant cataract now OU

## 2021-05-27 ENCOUNTER — Other Ambulatory Visit: Payer: Self-pay

## 2021-05-27 DIAGNOSIS — E118 Type 2 diabetes mellitus with unspecified complications: Secondary | ICD-10-CM

## 2021-05-28 ENCOUNTER — Other Ambulatory Visit: Payer: Self-pay

## 2021-05-28 DIAGNOSIS — E1122 Type 2 diabetes mellitus with diabetic chronic kidney disease: Secondary | ICD-10-CM

## 2021-05-28 MED ORDER — ATORVASTATIN CALCIUM 40 MG PO TABS: ORAL_TABLET | ORAL | 3 refills | Status: AC

## 2021-06-03 ENCOUNTER — Ambulatory Visit: Payer: Medicare Other | Admitting: Vascular Surgery

## 2021-06-03 ENCOUNTER — Other Ambulatory Visit: Payer: Self-pay

## 2021-06-03 ENCOUNTER — Inpatient Hospital Stay (HOSPITAL_COMMUNITY): Admission: RE | Admit: 2021-06-03 | Payer: Medicare Other | Source: Ambulatory Visit

## 2021-06-03 ENCOUNTER — Encounter: Payer: Self-pay | Admitting: Vascular Surgery

## 2021-06-03 VITALS — BP 180/79 | HR 90 | Temp 97.9°F | Resp 20 | Ht 62.0 in | Wt 225.0 lb

## 2021-06-03 DIAGNOSIS — N186 End stage renal disease: Secondary | ICD-10-CM | POA: Diagnosis not present

## 2021-06-03 MED ORDER — SODIUM CHLORIDE 0.9% FLUSH: 3.0000 mL | INTRAVENOUS | Status: DC | PRN

## 2021-06-03 MED ORDER — SODIUM CHLORIDE 0.9% FLUSH: 3.0000 mL | Freq: Two times a day (BID) | INTRAVENOUS | Status: DC

## 2021-06-03 MED ORDER — SODIUM CHLORIDE 0.9 % IV SOLN: 250.0000 mL | INTRAVENOUS | Status: DC | PRN

## 2021-06-03 NOTE — Progress Notes (Signed)
Patient ID: Rachel Hobbs, female   DOB: [birth year], 81 y.o.   MRN: [id number]  Reason for Consult: New Patient (Initial Visit)   Referred by Donnal Moat*  Subjective:     HPI:  Rachel Hobbs is a 81 y.o. female end-stage renal disease currently dialyzes Tuesdays, Thursdays and Saturdays via right IJ tunnel catheter.  She has 2 previous fistulas in the left upper extremity failed and more recently a thrombosed brachial to axillary vein graft.  She is right-hand dominant.  She does have pain in the left hand that has been present since her most recent dialysis access she is hoping to keep access in the left hand.  No previous pacemakers.  Past Medical History:  Diagnosis Date   Anemia of renal disease 06/16/2006   Qualifier: Diagnosis of  By: Marinell Blight, Dawn     Arthritis    CHF (congestive heart failure) ([place])    Depression    Diabetes mellitus    type 2   Hernia    Hyperlipidemia    Hypertension    Iron deficiency anemia    Low iron    Nodule of soft tissue 06/22/2012   Peripheral vascular disease (HCC)    in legs   Pneumonia    teenager   Renal disorder    CKD - dialysis T/TH/Sa   Shortness of breath dyspnea    with exertion   SMALL BOWEL OBSTRUCTION, HX OF 02/15/2007   Qualifier: Diagnosis of  By: [name], HEIDI     Family History  Problem Relation Age of Onset   Kidney disease Mother    Hypertension Mother    COPD Father        smoke   Cancer Father        Lung   Hypertension Father    Kidney disease Sister    Diabetes Daughter    Past Surgical History:  Procedure Laterality Date   ABDOMINAL HYSTERECTOMY     in the 42's   AV FISTULA PLACEMENT Left 12/05/2015   Procedure: RADIOCEPHALIC VERSUS BRACHIOCEPHALIC ARTERIOVENOUS (AV) FISTULA CREATION;  Surgeon: [name], MD;  [facility];  Service: Vascular;  Laterality: Left;   AV FISTULA PLACEMENT Left 05/20/2017   Procedure: INSERTION OF ARTERIOVENOUS (AV) GORE-TEX VASCULAR STRETCH  4-7 GRAFT ARM LEFT UPPER ARM;  Surgeon: [name], MD;  Location: [facility] OR;  Service: Vascular;  Laterality: Left;   [place] Left 06/04/2016   Procedure: FIRST STAGE BASILIC VEIN TRANSPOSITION;  Surgeon: [name], MD;  Location: [facility] OR;  Service: Vascular;  Laterality: Left;   BASCILIC VEIN TRANSPOSITION Left 08/04/2016   Procedure: LEFT UPPER ARM [place], SECOND STAGE;  Surgeon: [name], MD;  Location: [facility] OR;  Service: Vascular;  Laterality: Left;   HERNIA REPAIR     umbilical in the 94'H   INSERTION OF DIALYSIS CATHETER Right 12/05/2015   Procedure: INSERTION OF DIALYSIS CATHETER;  Surgeon: [name], MD;  Location: [facility] OR;  Service: Vascular;  Laterality: Right;   IR THROMBECTOMY AV FISTULA W/THROMBOLYSIS/PTA INC/SHUNT/IMG LEFT Left 05/09/2019   IR US GUIDE VASC ACCESS LEFT  05/09/2019    Short Social History:  Social History   Tobacco Use   Smoking status: Never   Smokeless tobacco: Never  Substance Use Topics   Alcohol use: No    No Known Allergies  Current Outpatient Medications  Medication Sig Dispense Refill   acetaminophen (TYLENOL) 500 MG  tablet Take 500 mg by mouth every 6 (six) hours as needed for mild pain or headache.     aspirin 81 MG tablet Take 81 mg by mouth daily.     Aspirin-Calcium Carbonate 81-777 MG TABS Take by mouth.     atorvastatin (LIPITOR) 40 MG tablet TAKE 1 TABLET(40 MG) BY MOUTH DAILY AT 6 PM Strength: 40 mg 90 tablet 3   B Complex-C-Folic Acid (NEPHRO VITAMINS) 0.8 MG TABS Take 1 tablet by mouth daily.     bisacodyl (DULCOLAX) 5 MG EC tablet Take 5 mg by mouth daily as needed for moderate constipation.     Blood Glucose Monitoring Suppl (BLOOD GLUCOSE METER) kit Use as instructed 1 each 0   Blood Glucose Monitoring Suppl (ONETOUCH VERIO) w/Device KIT Use daily as indicated 1 kit 0   calcium acetate (PHOSLO) 667 MG capsule Take 667-1,334 mg by mouth See admin instructions. Take 1334 mg with each  meal and 667 mg with each snack     ethyl chloride spray   0   gabapentin (NEURONTIN) 100 MG capsule Take 1 capsule (100 mg total) by mouth 3 (three) times daily. Take 100 mg in morning, and 200 mg at night 90 capsule 3   glucose blood (ONETOUCH VERIO) test strip Use as instructed 100 each 12   Insulin Pen Needle 31G X 8 MM MISC BD UltraFine III Pen Needles. For use with insulin pen device. Inject insulin 2X daily 100 each 3   Lancets (ONETOUCH ULTRASOFT) lancets Once daily testing plus prn for hypoglycemia 100 each 9   LANTUS SOLOSTAR 100 UNIT/ML Solostar Pen Inject 50 Units into the skin at bedtime. 15 mL 3   Multiple Vitamins-Minerals (QUIN B STRONG) TABS Take by mouth.     NEEDLE, DISP, 30 G (B-D DISP NEEDLE 30GX1") 30G X 1" MISC 1 each by Does not apply route daily. 100 each 2   ONE TOUCH ULTRA TEST test strip TEST BLOOD SUGAR EVERY DAY AND AS NEEDED FOR SYMPTOMS OF HYPOGLYCEMIA 100 each 0   ONETOUCH DELICA LANCETS 16X MISC Use daily as indicated 100 each 0   pregabalin (LYRICA) 75 MG capsule Take 1 capsule (75 mg total) by mouth 2 (two) times daily. 30 capsule 0   Current Facility-Administered Medications  Medication Dose Route Frequency Provider Last Rate Last Admin   oxymetazoline (AFRIN) 0.05 % nasal spray 1 spray  1 spray Each Nare Once Orvis Brill, DO        Review of Systems  Constitutional:  Constitutional negative. HENT: HENT negative.  Eyes: Eyes negative.  Respiratory: Respiratory negative.  Cardiovascular: Cardiovascular negative.  GI: Gastrointestinal negative.  Musculoskeletal:       Left hand pain Neurological: Neurological negative. Hematologic: Hematologic/lymphatic negative.  Psychiatric: Psychiatric negative.       Objective:  Objective   Vitals:   06/03/21 1417  BP: (!) 180/79  Pulse: 90  Resp: 20  Temp: 97.9 F (36.6 C)  SpO2: 98%  Weight: 225 lb (102.1 kg)  Height: 5' 2"  (1.575 m)   Body mass index is 41.15 kg/m.  Physical Exam HENT:      Head: Normocephalic.     Nose:     Comments: Wearing a mask Eyes:     Pupils: Pupils are equal, round, and reactive to light.  Neck:     Comments: Right IJ catheter in place with dressing clean dry intact Cardiovascular:     Pulses:          Radial  pulses are 2+ on the right side and 1+ on the left side.  Abdominal:     General: Abdomen is flat.     Palpations: Abdomen is soft.  Neurological:     Mental Status: She is alert.    Data: No studies today     Assessment/Plan:     81 year old female with end-stage renal disease currently dialyzing via catheter.  No studies were performed prior to today's visit.  Patient would prefer to keep access in her left upper extremity but I discussed with her that I am not overly optimistic.  We will start with bilateral upper extremity venography to evaluate for possible left upper extremity access but more likely will need to evaluate her for fistula versus graft on the right.  She demonstrates good understanding we will get this set up on a nondialysis day in the near future.     [name] MD [facility]

## 2021-06-03 NOTE — H&P (View-Only) (Signed)
Patient ID: Rachel Hobbs, female   DOB: 1941/04/07, 81 y.o.   MRN: [id number]  Reason for Consult: New Patient (Initial Visit)   Referred by Donnal Moat*  Subjective:     HPI:  Rachel Hobbs is a 81 y.o. female end-stage renal disease currently dialyzes Tuesdays, Thursdays and Saturdays via right IJ tunnel catheter.  She has 2 previous fistulas in the left upper extremity failed and more recently a thrombosed brachial to axillary vein graft.  She is right-hand dominant.  She does have pain in the left hand that has been present since her most recent dialysis access she is hoping to keep access in the left hand.  No previous pacemakers.  Past Medical History:  Diagnosis Date   Anemia of renal disease 06/16/2006   Qualifier: Diagnosis of  By: Marinell Blight, Dawn     Arthritis    CHF (congestive heart failure) ([place])    Depression    Diabetes mellitus    type 2   Hernia    Hyperlipidemia    Hypertension    Iron deficiency anemia    Low iron    Nodule of soft tissue 06/22/2012   Peripheral vascular disease ([facility])    in legs   Pneumonia    teenager   Renal disorder    CKD - dialysis T/TH/Sa   Shortness of breath dyspnea    with exertion   SMALL BOWEL OBSTRUCTION, HX OF 02/15/2007   Qualifier: Diagnosis of  By: [name], HEIDI     Family History  Problem Relation Age of Onset   Kidney disease Mother    Hypertension Mother    COPD Father        smoke   Cancer Father        Lung   Hypertension Father    Kidney disease Sister    Diabetes Daughter    Past Surgical History:  Procedure Laterality Date   ABDOMINAL HYSTERECTOMY     in the 15's   AV FISTULA PLACEMENT Left 12/05/2015   Procedure: RADIOCEPHALIC VERSUS BRACHIOCEPHALIC ARTERIOVENOUS (AV) FISTULA CREATION;  Surgeon: [name], MD;  Location: [facility] OR;  Service: Vascular;  Laterality: Left;   AV FISTULA PLACEMENT Left 05/20/2017   Procedure: INSERTION OF ARTERIOVENOUS (AV) GORE-TEX VASCULAR STRETCH  4-7 GRAFT ARM LEFT UPPER ARM;  Surgeon: [name], MD;  Location: [facility] OR;  Service: Vascular;  Laterality: Left;   [place] Left 06/04/2016   Procedure: FIRST STAGE BASILIC VEIN TRANSPOSITION;  Surgeon: [name], MD;  Location: [facility] OR;  Service: Vascular;  Laterality: Left;   BASCILIC VEIN TRANSPOSITION Left 08/04/2016   Procedure: LEFT UPPER ARM [place], SECOND STAGE;  Surgeon: [name], MD;  Location: [facility] OR;  Service: Vascular;  Laterality: Left;   HERNIA REPAIR     umbilical in the 38'T   INSERTION OF DIALYSIS CATHETER Right 12/05/2015   Procedure: INSERTION OF DIALYSIS CATHETER;  Surgeon: [name], MD;  Location: [facility] OR;  Service: Vascular;  Laterality: Right;   IR THROMBECTOMY AV FISTULA W/THROMBOLYSIS/PTA INC/SHUNT/IMG LEFT Left 05/09/2019   IR US GUIDE VASC ACCESS LEFT  05/09/2019    Short Social History:  Social History   Tobacco Use   Smoking status: Never   Smokeless tobacco: Never  Substance Use Topics   Alcohol use: No    No Known Allergies  Current Outpatient Medications  Medication Sig Dispense Refill   acetaminophen (TYLENOL) 500 MG  tablet Take 500 mg by mouth every 6 (six) hours as needed for mild pain or headache.     aspirin 81 MG tablet Take 81 mg by mouth daily.     Aspirin-Calcium Carbonate 81-777 MG TABS Take by mouth.     atorvastatin (LIPITOR) 40 MG tablet TAKE 1 TABLET(40 MG) BY MOUTH DAILY AT 6 PM Strength: 40 mg 90 tablet 3   B Complex-C-Folic Acid (NEPHRO VITAMINS) 0.8 MG TABS Take 1 tablet by mouth daily.     bisacodyl (DULCOLAX) 5 MG EC tablet Take 5 mg by mouth daily as needed for moderate constipation.     Blood Glucose Monitoring Suppl (BLOOD GLUCOSE METER) kit Use as instructed 1 each 0   Blood Glucose Monitoring Suppl (ONETOUCH VERIO) w/Device KIT Use daily as indicated 1 kit 0   calcium acetate (PHOSLO) 667 MG capsule Take 667-1,334 mg by mouth See admin instructions. Take 1334 mg with each  meal and 667 mg with each snack     ethyl chloride spray   0   gabapentin (NEURONTIN) 100 MG capsule Take 1 capsule (100 mg total) by mouth 3 (three) times daily. Take 100 mg in morning, and 200 mg at night 90 capsule 3   glucose blood (ONETOUCH VERIO) test strip Use as instructed 100 each 12   Insulin Pen Needle 31G X 8 MM MISC BD UltraFine III Pen Needles. For use with insulin pen device. Inject insulin 2X daily 100 each 3   Lancets (ONETOUCH ULTRASOFT) lancets Once daily testing plus prn for hypoglycemia 100 each 9   LANTUS SOLOSTAR 100 UNIT/ML Solostar Pen Inject 50 Units into the skin at bedtime. 15 mL 3   Multiple Vitamins-Minerals (QUIN B STRONG) TABS Take by mouth.     NEEDLE, DISP, 30 G (B-D DISP NEEDLE 30GX1") 30G X 1" MISC 1 each by Does not apply route daily. 100 each 2   ONE TOUCH ULTRA TEST test strip TEST BLOOD SUGAR EVERY DAY AND AS NEEDED FOR SYMPTOMS OF HYPOGLYCEMIA 100 each 0   ONETOUCH DELICA LANCETS 35T MISC Use daily as indicated 100 each 0   pregabalin (LYRICA) 75 MG capsule Take 1 capsule (75 mg total) by mouth 2 (two) times daily. 30 capsule 0   Current Facility-Administered Medications  Medication Dose Route Frequency Provider Last Rate Last Admin   oxymetazoline (AFRIN) 0.05 % nasal spray 1 spray  1 spray Each Nare Once Orvis Brill, DO        Review of Systems  Constitutional:  Constitutional negative. HENT: HENT negative.  Eyes: Eyes negative.  Respiratory: Respiratory negative.  Cardiovascular: Cardiovascular negative.  GI: Gastrointestinal negative.  Musculoskeletal:       Left hand pain Neurological: Neurological negative. Hematologic: Hematologic/lymphatic negative.  Psychiatric: Psychiatric negative.       Objective:  Objective   Vitals:   06/03/21 1417  BP: (!) 180/79  Pulse: 90  Resp: 20  Temp: 97.9 F (36.6 C)  SpO2: 98%  Weight: 225 lb (102.1 kg)  Height: 5' 2"  (1.575 m)   Body mass index is 41.15 kg/m.  Physical Exam HENT:      Head: Normocephalic.     Nose:     Comments: Wearing a mask Eyes:     Pupils: Pupils are equal, round, and reactive to light.  Neck:     Comments: Right IJ catheter in place with dressing clean dry intact Cardiovascular:     Pulses:          Radial  pulses are 2+ on the right side and 1+ on the left side.  Abdominal:     General: Abdomen is flat.     Palpations: Abdomen is soft.  Neurological:     Mental Status: She is alert.    Data: No studies today     Assessment/Plan:     81 year old female with end-stage renal disease currently dialyzing via catheter.  No studies were performed prior to today's visit.  Patient would prefer to keep access in her left upper extremity but I discussed with her that I am not overly optimistic.  We will start with bilateral upper extremity venography to evaluate for possible left upper extremity access but more likely will need to evaluate her for fistula versus graft on the right.  She demonstrates good understanding we will get this set up on a nondialysis day in the near future.     [name] MD [facility]

## 2021-06-15 ENCOUNTER — Encounter (HOSPITAL_COMMUNITY)
Admission: RE | Disposition: A | Discharge status: Home / Self Care | Payer: Self-pay | Source: Ambulatory Visit | Attending: Vascular Surgery

## 2021-06-15 ENCOUNTER — Encounter (HOSPITAL_COMMUNITY): Payer: Self-pay | Admitting: Vascular Surgery

## 2021-06-15 ENCOUNTER — Encounter: Payer: Self-pay | Admitting: Nephrology

## 2021-06-15 ENCOUNTER — Other Ambulatory Visit: Payer: Self-pay

## 2021-06-15 ENCOUNTER — Ambulatory Visit (HOSPITAL_COMMUNITY)
Admission: RE | Admit: 2021-06-15 | Discharge: 2021-06-15 | Disposition: A | Discharge status: Home / Self Care | Payer: Medicare Other | Source: Ambulatory Visit | Attending: Vascular Surgery | Admitting: Vascular Surgery

## 2021-06-15 DIAGNOSIS — E1151 Type 2 diabetes mellitus with diabetic peripheral angiopathy without gangrene: Secondary | ICD-10-CM | POA: Insufficient documentation

## 2021-06-15 DIAGNOSIS — N186 End stage renal disease: Secondary | ICD-10-CM | POA: Insufficient documentation

## 2021-06-15 DIAGNOSIS — D631 Anemia in chronic kidney disease: Secondary | ICD-10-CM | POA: Diagnosis not present

## 2021-06-15 DIAGNOSIS — Z794 Long term (current) use of insulin: Secondary | ICD-10-CM | POA: Diagnosis not present

## 2021-06-15 DIAGNOSIS — I132 Hypertensive heart and chronic kidney disease with heart failure and with stage 5 chronic kidney disease, or end stage renal disease: Secondary | ICD-10-CM | POA: Insufficient documentation

## 2021-06-15 DIAGNOSIS — E1122 Type 2 diabetes mellitus with diabetic chronic kidney disease: Secondary | ICD-10-CM | POA: Insufficient documentation

## 2021-06-15 DIAGNOSIS — Z992 Dependence on renal dialysis: Secondary | ICD-10-CM | POA: Insufficient documentation

## 2021-06-15 DIAGNOSIS — I509 Heart failure, unspecified: Secondary | ICD-10-CM | POA: Diagnosis not present

## 2021-06-15 DIAGNOSIS — M79642 Pain in left hand: Secondary | ICD-10-CM | POA: Diagnosis not present

## 2021-06-15 DIAGNOSIS — N185 Chronic kidney disease, stage 5: Secondary | ICD-10-CM | POA: Diagnosis not present

## 2021-06-15 HISTORY — PX: UPPER EXTREMITY VENOGRAPHY: CATH118272

## 2021-06-15 LAB — POCT I-STAT, CHEM 8
BUN: 26 mg/dL — ABNORMAL HIGH (ref 8–23)
Calcium, Ion: 1.15 mmol/L (ref 1.15–1.40)
Chloride: 94 mmol/L — ABNORMAL LOW (ref 98–111)
Creatinine, Ser: 6.8 mg/dL — ABNORMAL HIGH (ref 0.44–1.00)
Glucose, Bld: 165 mg/dL — ABNORMAL HIGH (ref 70–99)
HCT: 35 % — ABNORMAL LOW (ref 36.0–46.0)
Hemoglobin: 11.9 g/dL — ABNORMAL LOW (ref 12.0–15.0)
Potassium: 3.6 mmol/L (ref 3.5–5.1)
Sodium: 136 mmol/L (ref 135–145)
TCO2: 29 mmol/L (ref 22–32)

## 2021-06-15 SURGERY — UPPER EXTREMITY VENOGRAPHY
Laterality: Bilateral

## 2021-06-15 MED ORDER — HEPARIN (PORCINE) IN NACL 1000-0.9 UT/500ML-% IV SOLN
INTRAVENOUS | Status: AC
  Filled 2021-06-15: qty 500

## 2021-06-15 MED ORDER — IODIXANOL 320 MG/ML IV SOLN
INTRAVENOUS | Status: DC | PRN
  Administered 2021-06-15: 50 mL

## 2021-06-15 MED ORDER — LIDOCAINE HCL (PF) 1 % IJ SOLN
INTRAMUSCULAR | Status: AC
Start: 2021-06-15 — End: ?
  Filled 2021-06-15: qty 30

## 2021-06-15 SURGICAL SUPPLY — 2 items
STOPCOCK MORSE 400PSI 3WAY (MISCELLANEOUS) ×1 IMPLANT
TUBING CIL FLEX 10 FLL-RA (TUBING) ×1 IMPLANT

## 2021-06-15 NOTE — Interval H&P Note (Signed)
History and Physical Interval Note:  06/15/2021 7:20 AM  Rachel Hobbs  has presented today for surgery, with the diagnosis of end stage renal disease.  The various methods of treatment have been discussed with the patient and family. After consideration of risks, benefits and other options for treatment, the patient has consented to  Procedure(s): UPPER EXTREMITY VENOGRAPHY (N/A) as a surgical intervention.  The patient's history has been reviewed, patient examined, no change in status, stable for surgery.  I have reviewed the patient's chart and labs.  Questions were answered to the patient's satisfaction.     Servando Snare

## 2021-06-15 NOTE — Op Note (Signed)
° ° °  Patient name: Rachel Hobbs MRN: [id number] DOB: 1941/02/23 Sex: female  06/15/2021 Pre-operative Diagnosis: End-stage renal disease Post-operative diagnosis:  Same Surgeon:  Erlene Quan C. Donzetta Matters, MD Procedure Performed:   Bilateral upper extremity venography  Indications: 81 year old female with end-stage renal disease with left upper extremity accesses in the past.  She would like to keep access in the left upper extremity as she is right-hand dominant.  Plan today is for bilateral upper extremity venography.  Findings: On the right side she has collaterals filling her central system but it does appear that the axillary vein is patent as is the subclavian and right innominate vein.  There is a catheter in the right internal jugular vein.  On the left side she has multiple stents including the central system.  Runoff appears to be only via the cephalic vein there may be a small cephalic arch stenosis but centrally the subclavian and innominate veins on the left are patent.  I evaluated the upper extremity on the left with an ultrasound.  There is a patent cephalic vein all the way up although it is quite lateral on the forearm.  We will plan for left arm cephalic vein fistula or possibly will need an interposition graft of the cephalic vein.  This will be planned on a nondialysis day in the near future.  Patient is on dialysis Tuesdays Thursdays and Saturdays.   Procedure:  The patient was identified in the holding area and taken to procedure room where she was placed prone on the table.  Timeout was called.  Previous IVs in the bilateral upper extremities were accessed and contrast venography was performed bilaterally.  With the above findings I evaluated the left upper extremity ultrasound and we will plan for left upper extremity AV fistula versus interposition graft on a nondialysis day in the near future.  She tolerated procedure without immediate complication.  Contrast: 50cc     Derk Doubek C.  Donzetta Matters, MD [facility] [place] Office: [phone] Pager: [phone]

## 2021-06-17 ENCOUNTER — Other Ambulatory Visit: Payer: Self-pay

## 2021-06-17 ENCOUNTER — Ambulatory Visit (INDEPENDENT_AMBULATORY_CARE_PROVIDER_SITE_OTHER): Payer: Medicare Other | Admitting: Podiatry

## 2021-06-17 ENCOUNTER — Encounter: Payer: Self-pay | Admitting: Podiatry

## 2021-06-17 DIAGNOSIS — M79609 Pain in unspecified limb: Secondary | ICD-10-CM

## 2021-06-17 DIAGNOSIS — L84 Corns and callosities: Secondary | ICD-10-CM

## 2021-06-17 DIAGNOSIS — B351 Tinea unguium: Secondary | ICD-10-CM | POA: Diagnosis not present

## 2021-06-17 DIAGNOSIS — E1151 Type 2 diabetes mellitus with diabetic peripheral angiopathy without gangrene: Secondary | ICD-10-CM | POA: Diagnosis not present

## 2021-06-17 DIAGNOSIS — M792 Neuralgia and neuritis, unspecified: Secondary | ICD-10-CM

## 2021-06-17 DIAGNOSIS — Z794 Long term (current) use of insulin: Secondary | ICD-10-CM | POA: Diagnosis not present

## 2021-06-17 NOTE — Progress Notes (Signed)
This patient returns to my office for at risk foot care.  This patient requires this care by a professional since this patient will be at risk due to having chronic kidney disease and diabetes type 2.    This patient is unable to cut nails herself since the patient cannot reach her nails.These nails are painful walking and wearing shoes. Patient has painful callus on her bunion left foot. This patient presents for at risk foot care today.  ? ?General Appearance  Alert, conversant and in no acute stress. ? ?Vascular  Dorsalis pedis and posterior tibial  pulses are not  palpable  bilaterally.  Capillary return is within normal limits  bilaterally. Temperature is within normal limits  Bilaterally.  Swelling bilaterally. ? ?Neurologic  Senn-Weinstein monofilament wire test diminished  bilaterally. Muscle power within normal limits bilaterally. ? ?Nails Thick disfigured discolored nails with subungual debris  from hallux to fifth toes bilaterally. No evidence of bacterial infection or drainage bilaterally. ? ?Orthopedic  No limitations of motion  feet .  No crepitus or effusions noted.  No bony pathology or digital deformities noted. HAV  B/L. ? ?Skin  normotropic skin with no porokeratosis noted bilaterally.  No signs of infections or ulcers noted.   Callus left foot ? 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 ? ?Consent was obtained for treatment procedures.   Mechanical debridement of nails 1-5  bilaterally performed with a nail nipper.  Filed with dremel without incident. Debride callus with # 15 blade. ? ? ?Return office visit    12  weeks                  Told patient to return for periodic foot care and evaluation due to potential at risk complications. ? ? ?Gardiner Barefoot DPM  ?

## 2021-07-08 ENCOUNTER — Telehealth: Payer: Self-pay

## 2021-07-08 NOTE — Telephone Encounter (Signed)
Attempted to reach patient on multiple attempts to schedule left arm fistula versus graft with Dr. Donzetta Matters. Will mail patient letter requesting to contact office to schedule.  ?

## 2021-07-20 ENCOUNTER — Other Ambulatory Visit: Payer: Self-pay

## 2021-07-27 ENCOUNTER — Encounter (INDEPENDENT_AMBULATORY_CARE_PROVIDER_SITE_OTHER): Payer: Self-pay | Admitting: Ophthalmology

## 2021-07-27 ENCOUNTER — Ambulatory Visit (INDEPENDENT_AMBULATORY_CARE_PROVIDER_SITE_OTHER): Payer: Medicare Other | Admitting: Ophthalmology

## 2021-07-27 DIAGNOSIS — H43811 Vitreous degeneration, right eye: Secondary | ICD-10-CM

## 2021-07-27 DIAGNOSIS — E113393 Type 2 diabetes mellitus with moderate nonproliferative diabetic retinopathy without macular edema, bilateral: Secondary | ICD-10-CM | POA: Diagnosis not present

## 2021-07-27 DIAGNOSIS — H2513 Age-related nuclear cataract, bilateral: Secondary | ICD-10-CM | POA: Diagnosis not present

## 2021-07-27 NOTE — Assessment & Plan Note (Signed)
With media opacity in place that he has cataracts, no signs of significant progression of retinopathy in either eye and certainly no vitreous hemorrhage and no signs of neovascularization ? ?At some length I explained to the patient the requirement and the need medically as well as visually to have cataract surgery in each eye sequentially in the near future so as to allow ongoing medical monitoring of the diabetic eye disease and the changes in each eye ?

## 2021-07-27 NOTE — Progress Notes (Signed)
? ? ?07/27/2021 ? ?  ? ?CHIEF COMPLAINT ?Patient presents for  ?Chief Complaint  ?Patient presents with  ? 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 ? ? ? ?HISTORY OF PRESENT ILLNESS: ?Rachel Hobbs is a 81 y.o. female who presents to the clinic today for:  ? ?HPI   ?4 mos fu, Color FP. ?Pt states she has cataract surgery scheduled with one of the [facility] but is unsure when it is. States she has surgery on left arm April 28th and she is going to call to re schedule her cataract surgery. ?Pt did not check BS this morning, patient states she does not check her blood sugar every morning. ?Pt states "I think I am seeing pretty good but I see dots, which I guess is the diabetes. I see little lines," longstanding. ?Last edited by Laurin Coder on 07/27/2021  1:44 PM.  ?  ? ? ?Referring physician: ?Debbra Riding, MD ?[place] ?[address] ?[place],  [place] [address] ? ?HISTORICAL INFORMATION:  ? ?Selected notes from the [facility] ?  ? ?Lab Results  ?Component Value Date  ? HGBA1C 8.5 (A) 11/03/2020  ?  ? ?CURRENT MEDICATIONS: ?No current outpatient medications on file. (Ophthalmic Drugs)  ? ?No current facility-administered medications for this visit. (Ophthalmic Drugs)  ? ?Current Outpatient Medications (Other)  ?Medication Sig  ? acetaminophen (TYLENOL) 500 MG tablet Take 500 mg by mouth every 6 (six) hours as needed for mild pain or headache.  ? aspirin 81 MG tablet Take 81 mg by mouth every other day.  ? atorvastatin (LIPITOR) 40 MG tablet TAKE 1 TABLET(40 MG) BY MOUTH DAILY AT 6 PM Strength: 40 mg  ? bisacodyl (DULCOLAX) 5 MG EC tablet Take 5 mg by mouth daily as needed for moderate constipation.  ? Blood Glucose Monitoring Suppl (BLOOD GLUCOSE METER) kit Use as instructed  ? Blood Glucose Monitoring Suppl (ONETOUCH VERIO) w/Device KIT Use daily as indicated  ? calcium acetate (PHOSLO) 667 MG capsule Take 667-1,334 mg by mouth See admin instructions. Take 1334 mg with each meal and 667 mg with each  snack  ? gabapentin (NEURONTIN) 100 MG capsule Take 1 capsule (100 mg total) by mouth 3 (three) times daily. Take 100 mg in morning, and 200 mg at night (Patient not taking: Reported on 06/12/2021)  ? glucose blood (ONETOUCH VERIO) test strip Use as instructed  ? Insulin Pen Needle 31G X 8 MM MISC BD UltraFine III Pen Needles. For use with insulin pen device. Inject insulin 2X daily  ? isosorbide mononitrate (IMDUR) 30 MG 24 hr tablet Take 30 mg by mouth daily.  ? Lancets (ONETOUCH ULTRASOFT) lancets Once daily testing plus prn for hypoglycemia  ? LANTUS SOLOSTAR 100 UNIT/ML Solostar Pen Inject 50 Units into the skin at bedtime. (Patient taking differently: Inject 40 Units into the skin every evening.)  ? NEEDLE, DISP, 30 G (B-D DISP NEEDLE 30GX1") 30G X 1" MISC 1 each by Does not apply route daily.  ? ONE TOUCH ULTRA TEST test strip TEST BLOOD SUGAR EVERY DAY AND AS NEEDED FOR SYMPTOMS OF HYPOGLYCEMIA  ? ONETOUCH DELICA LANCETS 81L MISC Use daily as indicated  ? pregabalin (LYRICA) 75 MG capsule Take 1 capsule (75 mg total) by mouth 2 (two) times daily. (Patient not taking: Reported on 06/12/2021)  ? ?Current Facility-Administered Medications (Other)  ?Medication Route  ? 0.9 %  sodium chloride infusion Intravenous  ? oxymetazoline (AFRIN) 0.05 % nasal spray 1 spray Each  Nare  ? sodium chloride flush (NS) 0.9 % injection 3 mL Intravenous  ? sodium chloride flush (NS) 0.9 % injection 3 mL Intravenous  ? ? ? ? ?REVIEW OF SYSTEMS: ? ? ? ?ALLERGIES ?No Known Allergies ? ?PAST MEDICAL HISTORY ?Past Medical History:  ?Diagnosis Date  ? Anemia of renal disease 06/16/2006  ? Qualifier: Diagnosis of  By: Marinell Blight, Dawn    ? Arthritis   ? CHF (congestive heart failure) ([place])   ? Depression   ? Diabetes mellitus   ? type 2  ? Hernia   ? Hyperlipidemia   ? Hypertension   ? Iron deficiency anemia   ? Low iron   ? Nodule of soft tissue 06/22/2012  ? Peripheral vascular disease ([place])   ? in legs  ? Pneumonia   ? teenager  ?  Renal disorder   ? CKD - dialysis T/TH/Sa  ? Shortness of breath dyspnea   ? with exertion  ? SMALL BOWEL OBSTRUCTION, HX OF 02/15/2007  ? Qualifier: Diagnosis of  By: [name], Britt    ? ?Past Surgical History:  ?Procedure Laterality Date  ? ABDOMINAL HYSTERECTOMY    ? in the 70's  ? AV FISTULA PLACEMENT Left 12/05/2015  ? Procedure: RADIOCEPHALIC VERSUS BRACHIOCEPHALIC ARTERIOVENOUS (AV) FISTULA CREATION;  Surgeon: [name], MD;  Location[facility] OR;  Service: Vascular;  Laterality: Left;  ? AV FISTULA PLACEMENT Left 05/20/2017  ? Procedure: INSERTION OF ARTERIOVENOUS (AV) GORE-TEX VASCULAR STRETCH 4-7 GRAFT ARM LEFT UPPER ARM;  Surgeon: [name], MD;  Location: [place];  Service: Vascular;  Laterality: Left;  ? BASCILIC VEIN TRANSPOSITION Left 06/04/2016  ? Procedure: FIRST STAGE BASILIC VEIN TRANSPOSITION;  Surgeon: [name], MD;  Location: [place] [facility];  Service: Vascular;  Laterality: Left;  ? BASCILIC VEIN TRANSPOSITION Left 08/04/2016  ? Procedure: LEFT UPPER ARM [place], SECOND STAGE;  Surgeon: [name], MD;  Location: [facility];  Service: Vascular;  Laterality: Left;  ? HERNIA REPAIR    ? umbilical in the 68'E  ? INSERTION OF DIALYSIS CATHETER Right 12/05/2015  ? Procedure: INSERTION OF DIALYSIS CATHETER;  Surgeon: [name], MD;  Location: [facility] OR;  Service: Vascular;  Laterality: Right;  ? IR THROMBECTOMY AV FISTULA W/THROMBOLYSIS/PTA INC/SHUNT/IMG LEFT Left 05/09/2019  ? IR US GUIDE VASC ACCESS LEFT  05/09/2019  ? UPPER EXTREMITY VENOGRAPHY Bilateral 06/15/2021  ? Procedure: UPPER EXTREMITY VENOGRAPHY;  Surgeon: [name], MD;  Location: [place] CV LAB;  Service: Cardiovascular;  Laterality: Bilateral;  ? ? ?FAMILY HISTORY ?Family History  ?Problem Relation Age of Onset  ? Kidney disease Mother   ? Hypertension Mother   ? COPD Father   ?     smoke  ? Cancer Father   ?     Lung  ? Hypertension Father   ? Kidney disease Sister   ? Diabetes Daughter    ? ? ?SOCIAL HISTORY ?Social History  ? ?Tobacco Use  ? Smoking status: Never  ? Smokeless tobacco: Never  ?Vaping Use  ? Vaping Use: Never used  ?Substance Use Topics  ? Alcohol use: No  ? Drug use: No  ? ?  ? ?  ? ?OPHTHALMIC EXAM: ? ?Base Eye Exam   ? ? Visual Acuity (ETDRS)   ? ?   Right Left  ? Dist [place] 20/40 -2 20/40 -2  ? Dist ph [place] 20/30 -1 20/30 -2  ? ?  ?  ? ?  Tonometry (Tonopen, 1:50 PM)   ? ?   Right Left  ? Pressure 16 10  ? ?  ?  ? ? Pupils   ? ?   Pupils Dark Light React APD  ? Right PERRL 2 2 Minimal None  ? Left PERRL 2 2 Minimal None  ? ?  ?  ? ? Visual Fields   ? ?   Left Right  ?  Full Full  ? ?  ?  ? ? Extraocular Movement   ? ?   Right Left  ?  Full Full  ? ?  ?  ? ? Neuro/Psych   ? ? Oriented x3: Yes  ? Mood/Affect: Normal  ? ?  ?  ? ? Dilation   ? ? Both eyes: 1.0% Mydriacyl, 2.5% Phenylephrine @ 1:50 PM  ? ?  ?  ? ?  ? ?Slit Lamp and Fundus Exam   ? ? External Exam   ? ?   Right Left  ? External Normal Normal  ? ?  ?  ? ? Slit Lamp Exam   ? ?   Right Left  ? Lids/Lashes Normal Normal  ? Conjunctiva/Sclera White and quiet White and quiet  ? Cornea Clear Clear  ? Anterior Chamber Narrow angle,,at 1/2 ct depth Deep and quiet.. narrow, at 1/2 ac deptha  ? Iris Round and reactive Round and reactive  ? Lens 3+ Nuclear sclerosis, 3+ Cortical cataract 2+ Cortical cataract, 2.5+ Nuclear sclerosis  ? Anterior Vitreous Posterior vitreous detachment Normal  ? ?  ?  ? ? Fundus Exam   ? ?   Right Left  ? Posterior Vitreous Posterior vitreous detachment Normal  ? Disc Normal Normal  ? C/D Ratio 0.3 0.25  ? Macula Normal Normal, no macular thickening, no clinically significant macular edema  ? Vessels Moderate NPDR Moderate NPDR  ? Periphery Normal Normal  ? ?  ?  ? ?  ? ? ?IMAGING AND PROCEDURES  ?Imaging and Procedures for 07/27/21 ? ?Color Fundus Photography Optos - OU - Both Eyes   ? ?   ?Right Eye ?Progression has no prior data. Disc findings include normal observations. Macula : normal observations.   ? ?Left Eye ?Progression has no prior data. Disc findings include normal observations. Macula : normal observations.  ? ?Notes ?Cloudy media opacity from [facility] bilaterally ? ?No obvious progression of disease. ? ?  ?

## 2021-07-27 NOTE — Assessment & Plan Note (Signed)
Moderate to severe cataract opacity including cortical spoking which does hamper adequate visualization of the posterior segment with indirect ophthalmoscopy as examination typically is performed while patient is in wheelchair ?

## 2021-07-27 NOTE — Assessment & Plan Note (Signed)
No retinal holes or tears ?

## 2021-08-13 ENCOUNTER — Encounter (HOSPITAL_COMMUNITY): Payer: Self-pay | Admitting: Vascular Surgery

## 2021-08-13 NOTE — Anesthesia Preprocedure Evaluation (Addendum)
Anesthesia Evaluation  ?Patient identified by MRN, date of birth, ID band ?Patient awake ? ? ? ?Reviewed: ?Allergy & Precautions, NPO status , Patient's Chart, lab work & pertinent test results, reviewed documented beta blocker date and time  ? ?Airway ?Mallampati: II ? ?TM Distance: >3 FB ?Neck ROM: Full ? ? ? Dental ? ?(+) Edentulous Upper, Edentulous Lower ?  ?Pulmonary ?shortness of breath and with exertion, pneumonia, resolved,  ?  ?Pulmonary exam normal ?breath sounds clear to auscultation ? ? ? ? ? ? Cardiovascular ?hypertension, Pt. on medications ?+ Peripheral Vascular Disease and +CHF  ?Normal cardiovascular exam ?Rhythm:Regular Rate:Normal ? ?EKG 06/15/21 ?NSR, LAFB, low voltage, non specific T wave inversion lateral leads ? ?Echo 11/26/20 ?1. Left ventricular ejection fraction by 3D volume is 66 %. The left ventricle has normal function. The left ventricle has no regional wall motion abnormalities. There is moderate left ventricular hypertrophy. Left ventricular diastolic parameters are consistent with Grade I diastolic dysfunction (impaired relaxation).  ??2. Right ventricular systolic function is normal. The right ventricular size is normal. There is mildly elevated pulmonary artery systolic pressure. The estimated right ventricular systolic pressure is 15.1 mmHg.  ??3. There are calcified mitral valve chordae. The mitral valve is normal in structure. Trivial mitral valve regurgitation. No evidence of mitral stenosis. The mean mitral valve gradient is 4.0 mmHg.  ??4. The aortic valve is normal in structure. Aortic valve regurgitation is not visualized. No aortic stenosis is present.  ??5. The inferior vena cava is normal in size with greater than 50% respiratory variability, suggesting right atrial pressure of 3 mmHg.  ?  ?Neuro/Psych ?PSYCHIATRIC DISORDERS Depression Diabetic peripheral neuropathy ?Glaucoma od ?  ? GI/Hepatic ?negative GI ROS, Neg liver ROS,    ?Endo/Other  ?diabetes, Poorly Controlled, Type 2, Insulin DependentMorbid obesity ? Renal/GU ?ESRF and DialysisRenal diseaseDialysis T,Th, Sat  ?negative genitourinary ?  ?Musculoskeletal ? ?(+) Arthritis , Osteoarthritis,   ? Abdominal ?(+) + obese,   ?Peds ? Hematology ? ?(+) Blood dyscrasia, anemia ,   ?Anesthesia Other Findings ? ? Reproductive/Obstetrics ? ?  ? ? ? ? ? ? ? ? ? ? ? ? ? ?  ?  ? ? ? ? ? ?Please refer to Progress note on 08/13/21 for Anesthesia Chart Review. Junie Bame, DNP, CRNA, NP-C ?Short Stay/Anesthesia ? ?Anesthesia Physical ?Anesthesia Plan ? ?ASA: 4 ? ?Anesthesia Plan: MAC and Regional  ? ?Post-op Pain Management: Regional block* and Minimal or no pain anticipated  ? ?Induction: Intravenous ? ?PONV Risk Score and Plan: 2 and Treatment may vary due to age or medical condition, Propofol infusion and Ondansetron ? ?Airway Management Planned: Natural Airway and Simple Face Mask ? ?Additional Equipment:  ? ?Intra-op Plan:  ? ?Post-operative Plan:  ? ?Informed Consent: I have reviewed the patients History and Physical, chart, labs and discussed the procedure including the risks, benefits and alternatives for the proposed anesthesia with the patient or authorized representative who has indicated his/her understanding and acceptance.  ? ? ? ?Dental advisory given ? ?Plan Discussed with: CRNA and Anesthesiologist ? ?Anesthesia Plan Comments:   ? ? ? ? ? ?Anesthesia Quick Evaluation ? ?

## 2021-08-13 NOTE — Progress Notes (Addendum)
? ?  Anesthesia review: yes ? ?------------- ? ?SDW INSTRUCTIONS: voicemail left on pt phone ? ?Your procedure is scheduled on Friday 4/28. Please report to [facility] Main Entrance "A" at 0530 A.M., and check in at the Admitting office. Call this number if you have problems the morning of surgery: [phone] ? ? ?Remember: Do not eat or drink after midnight the night before your surgery ?  ?Medications to take morning of surgery with a sip of water include: ?Tylenol if needed ?Aspirin ?Please bring all inhalers with you the day of surgery.  ? ?** PLEASE check your blood sugar the morning of your surgery when you wake up and every 2 hours until you get to the Short Stay unit. ? ?If your blood sugar is less than 70 mg/dL, you will need to treat for low blood sugar: ?Do not take insulin. ?Treat a low blood sugar (less than 70 mg/dL) with ? cup of clear juice (cranberry or apple), 4 glucose tablets, OR glucose gel. ?Recheck blood sugar in 15 minutes after treatment (to make sure it is greater than 70 mg/dL). If your blood sugar is not greater than 70 mg/dL on recheck, call [phone] for further instructions. ? ? ?As of today, STOP taking any Aleve, Naproxen, Ibuprofen, Motrin, Advil, Goody's, BC's, all herbal medications, fish oil, and all vitamins. ? ?  ?The Morning of Surgery ?Do not wear jewelry, make-up or nail polish. ?Do not wear lotions, powders, or perfumes/colognes, or deodorant ?Do not bring valuables to the hospital. ?[facility] is not responsible for any belongings or valuables. ? ?If you are a smoker, DO NOT Smoke 24 hours prior to surgery ? ?If you wear a CPAP at night please bring your mask the morning of surgery  ? ?Remember that you must have someone to transport you home after your surgery, and remain with you for 24 hours if you are discharged the same day. ? ?Please bring cases for contacts, glasses, hearing aids, dentures or bridgework because it cannot be worn into surgery.  ? ?Patients  discharged the day of surgery will not be allowed to drive home.  ? ?Please shower the NIGHT BEFORE/MORNING OF SURGERY (use antibacterial soap like DIAL soap if possible). Wear comfortable clothes the morning of surgery. Oral Hygiene is also important to reduce your risk of infection.  Remember - BRUSH YOUR TEETH THE MORNING OF SURGERY WITH YOUR REGULAR TOOTHPASTE ? ?Patient denies shortness of breath, fever, cough and chest pain.  ? ? ?   ? ?

## 2021-08-13 NOTE — Progress Notes (Addendum)
Anesthesia Chart Review: ? ? ? Case: [id number] Date/Time: 08/14/21 0715  ? Procedure: LEFT ARM ARTERIOVENOUS (AV) FISTULA CREATION VERSUS ARTERIOVENOUS GRAFT (Left)  ? Anesthesia type: Choice  ? Pre-op diagnosis: END STAGE RENAL DISEASE  ? [facility] ROOM [facility] / [facility]  ? Surgeons: [name], MD  ? ?  ? ? ?DISCUSSION: ?Ms. Weesner is a 81 yo female with PMHx of ESRD on dialysis Tue/Thur/Sat, HTN, DM2, diabetic retinopathy and PVD. She was 2 previous fistulas in the LUE that failed and has had a thrombosed brachial to axillary VG.  ? ?VS:  ? ?  06/15/2021  ?  8:45 AM 06/15/2021  ?  8:30 AM 06/15/2021  ?  8:14 AM  ?Vitals with BMI  ?Systolic [phone]  ?Diastolic 67 59 65  ?Pulse 80  82  ? ? ? ?PROVIDERS: ?Simmons-Robinson, Riki Sheer, MD ? ? ?LABS: No recent labs  ? ? ?IMAGES: ? ? ?EKG: ?06/15/21:  ?NSR 83 BPM, low voltage QRS  ?no changes compared to previous ECG ? ?CV: ?TTE (11/26/20) ? 1. Left ventricular ejection fraction by 3D volume is 66 %. The left  ?ventricle has normal function. The left ventricle has no regional wall  ?motion abnormalities. There is moderate left ventricular hypertrophy. Left  ?ventricular diastolic parameters are  ?consistent with Grade I diastolic dysfunction (impaired relaxation).  ? 2. Right ventricular systolic function is normal. The right ventricular  ?size is normal. There is mildly elevated pulmonary artery systolic  ?pressure. The estimated right ventricular systolic pressure is 19.6 mmHg.  ? 3. There are calcified mitral valve chordae. The mitral valve is normal  ?in structure. Trivial mitral valve regurgitation. No evidence of mitral  ?stenosis. The mean mitral valve gradient is 4.0 mmHg.  ? 4. The aortic valve is normal in structure. Aortic valve regurgitation is  ?not visualized. No aortic stenosis is present.  ? 5. The inferior vena cava is normal in size with greater than 50%  ?respiratory variability, suggesting right atrial pressure of 3 mmHg. ? ?Past Medical  History:  ?Diagnosis Date  ? Anemia of renal disease 06/16/2006  ? Qualifier: Diagnosis of  By: Marinell Blight, Dawn    ? Arthritis   ? CHF (congestive heart failure) ([place])   ? Depression   ? Diabetes mellitus   ? type 2  ? Hernia   ? Hyperlipidemia   ? Hypertension   ? Iron deficiency anemia   ? Low iron   ? Nodule of soft tissue 06/22/2012  ? Peripheral vascular disease ([place])   ? in legs  ? Pneumonia   ? teenager  ? Renal disorder   ? CKD - dialysis T/TH/Sa  ? Shortness of breath dyspnea   ? with exertion  ? SMALL BOWEL OBSTRUCTION, HX OF 02/15/2007  ? Qualifier: Diagnosis of  By: [name], [place]    ? ? ?Past Surgical History:  ?Procedure Laterality Date  ? ABDOMINAL HYSTERECTOMY    ? in the 70's  ? AV FISTULA PLACEMENT Left 12/05/2015  ? Procedure: RADIOCEPHALIC VERSUS BRACHIOCEPHALIC ARTERIOVENOUS (AV) FISTULA CREATION;  Surgeon: [name], MD;  Location: [facility] OR;  Service: Vascular;  Laterality: Left;  ? AV FISTULA PLACEMENT Left 05/20/2017  ? Procedure: INSERTION OF ARTERIOVENOUS (AV) GORE-TEX VASCULAR STRETCH 4-7 GRAFT ARM LEFT UPPER ARM;  Surgeon: [name], MD;  Location: [place];  Service: Vascular;  Laterality: Left;  ? BASCILIC VEIN TRANSPOSITION Left 06/04/2016  ? Procedure: FIRST STAGE BASILIC  VEIN TRANSPOSITION;  Surgeon: [name], MD;  Location: [place];  Service: Vascular;  Laterality: Left;  ? BASCILIC VEIN TRANSPOSITION Left 08/04/2016  ? Procedure: LEFT UPPER ARM [place], SECOND STAGE;  Surgeon: [name], MD;  Location: [place];  Service: Vascular;  Laterality: Left;  ? HERNIA REPAIR    ? umbilical in the 24'E  ? INSERTION OF DIALYSIS CATHETER Right 12/05/2015  ? Procedure: INSERTION OF DIALYSIS CATHETER;  Surgeon: [name], MD;  Location: [facility] OR;  Service: Vascular;  Laterality: Right;  ? IR THROMBECTOMY AV FISTULA W/THROMBOLYSIS/PTA INC/SHUNT/IMG LEFT Left 05/09/2019  ? IR US GUIDE VASC ACCESS LEFT  05/09/2019  ? UPPER EXTREMITY VENOGRAPHY Bilateral 06/15/2021   ? Procedure: UPPER EXTREMITY VENOGRAPHY;  Surgeon: [name], MD;  Location: [place] [facility];  Service: Cardiovascular;  Laterality: Bilateral;  ? ? ?MEDICATIONS: ? 0.9 %  sodium chloride infusion  ? oxymetazoline (AFRIN) 0.05 % nasal spray 1 spray  ? sodium chloride flush (NS) 0.9 % injection 3 mL  ? sodium chloride flush (NS) 0.9 % injection 3 mL  ? ? acetaminophen (TYLENOL) 500 MG tablet  ? aspirin 81 MG tablet  ? atorvastatin (LIPITOR) 40 MG tablet  ? bisacodyl (DULCOLAX) 5 MG EC tablet  ? Blood Glucose Monitoring Suppl (BLOOD GLUCOSE METER) kit  ? Blood Glucose Monitoring Suppl (ONETOUCH VERIO) w/Device KIT  ? calcium acetate (PHOSLO) 667 MG capsule  ? gabapentin (NEURONTIN) 100 MG capsule  ? glucose blood (ONETOUCH VERIO) test strip  ? Insulin Pen Needle 31G X 8 MM MISC  ? isosorbide mononitrate (IMDUR) 30 MG 24 hr tablet  ? Lancets (ONETOUCH ULTRASOFT) lancets  ? LANTUS SOLOSTAR 100 UNIT/ML Solostar Pen  ? NEEDLE, DISP, 30 G (B-D DISP NEEDLE 30GX1") 30G X 1" MISC  ? ONE TOUCH ULTRA TEST test strip  ? ONETOUCH DELICA LANCETS 69F MISC  ? pregabalin (LYRICA) 75 MG capsule  ? ? ? ? ?Junie Bame, DNP, CRNA, NP-C ?Short Stay/Anesthesia ? ? ? ? ?

## 2021-08-14 ENCOUNTER — Other Ambulatory Visit (HOSPITAL_COMMUNITY): Payer: Self-pay

## 2021-08-14 ENCOUNTER — Other Ambulatory Visit: Payer: Self-pay

## 2021-08-14 ENCOUNTER — Encounter (HOSPITAL_COMMUNITY)
Admission: RE | Disposition: A | Discharge status: Home / Self Care | Payer: Self-pay | Source: Home / Self Care | Attending: Vascular Surgery

## 2021-08-14 ENCOUNTER — Encounter (HOSPITAL_COMMUNITY): Payer: Self-pay | Admitting: Vascular Surgery

## 2021-08-14 ENCOUNTER — Ambulatory Visit (HOSPITAL_COMMUNITY)
Admission: RE | Admit: 2021-08-14 | Discharge: 2021-08-14 | Disposition: A | Discharge status: Home / Self Care | Payer: Medicare Other | Attending: Vascular Surgery | Admitting: Vascular Surgery

## 2021-08-14 ENCOUNTER — Ambulatory Visit (HOSPITAL_BASED_OUTPATIENT_CLINIC_OR_DEPARTMENT_OTHER): Payer: Medicare Other | Admitting: Anesthesiology

## 2021-08-14 ENCOUNTER — Ambulatory Visit (HOSPITAL_COMMUNITY): Payer: Medicare Other | Admitting: Anesthesiology

## 2021-08-14 ENCOUNTER — Encounter (HOSPITAL_COMMUNITY): Payer: Self-pay

## 2021-08-14 DIAGNOSIS — N186 End stage renal disease: Secondary | ICD-10-CM

## 2021-08-14 DIAGNOSIS — I132 Hypertensive heart and chronic kidney disease with heart failure and with stage 5 chronic kidney disease, or end stage renal disease: Secondary | ICD-10-CM

## 2021-08-14 DIAGNOSIS — Z794 Long term (current) use of insulin: Secondary | ICD-10-CM

## 2021-08-14 DIAGNOSIS — I509 Heart failure, unspecified: Secondary | ICD-10-CM | POA: Diagnosis not present

## 2021-08-14 DIAGNOSIS — Z992 Dependence on renal dialysis: Secondary | ICD-10-CM | POA: Diagnosis not present

## 2021-08-14 DIAGNOSIS — N185 Chronic kidney disease, stage 5: Secondary | ICD-10-CM | POA: Diagnosis not present

## 2021-08-14 DIAGNOSIS — E1122 Type 2 diabetes mellitus with diabetic chronic kidney disease: Secondary | ICD-10-CM

## 2021-08-14 HISTORY — PX: AV FISTULA PLACEMENT: SHX1204

## 2021-08-14 LAB — POCT I-STAT, CHEM 8
BUN: 19 mg/dL (ref 8–23)
Calcium, Ion: 1.13 mmol/L — ABNORMAL LOW (ref 1.15–1.40)
Chloride: 95 mmol/L — ABNORMAL LOW (ref 98–111)
Creatinine, Ser: 6.3 mg/dL — ABNORMAL HIGH (ref 0.44–1.00)
Glucose, Bld: 152 mg/dL — ABNORMAL HIGH (ref 70–99)
HCT: 33 % — ABNORMAL LOW (ref 36.0–46.0)
Hemoglobin: 11.2 g/dL — ABNORMAL LOW (ref 12.0–15.0)
Potassium: 3.8 mmol/L (ref 3.5–5.1)
Sodium: 135 mmol/L (ref 135–145)
TCO2: 30 mmol/L (ref 22–32)

## 2021-08-14 LAB — GLUCOSE, CAPILLARY
Glucose-Capillary: 161 mg/dL — ABNORMAL HIGH (ref 70–99)
Glucose-Capillary: 137 mg/dL — ABNORMAL HIGH (ref 70–99)

## 2021-08-14 SURGERY — ARTERIOVENOUS (AV) FISTULA CREATION
Anesthesia: Monitor Anesthesia Care | Laterality: Left

## 2021-08-14 MED ORDER — PAPAVERINE HCL 30 MG/ML IJ SOLN
INTRAMUSCULAR | Status: AC
  Filled 2021-08-14: qty 2

## 2021-08-14 MED ORDER — HEPARIN SODIUM (PORCINE) 1000 UNIT/ML IJ SOLN
INTRAMUSCULAR | Status: AC
  Filled 2021-08-14: qty 10

## 2021-08-14 MED ORDER — OXYCODONE HCL 5 MG/5ML PO SOLN: 5.0000 mg | Freq: Once | ORAL | Status: DC | PRN

## 2021-08-14 MED ORDER — 0.9 % SODIUM CHLORIDE (POUR BTL) OPTIME
TOPICAL | Status: DC | PRN
  Administered 2021-08-14: 1000 mL

## 2021-08-14 MED ORDER — FENTANYL CITRATE (PF) 250 MCG/5ML IJ SOLN
INTRAMUSCULAR | Status: AC
  Filled 2021-08-14: qty 5

## 2021-08-14 MED ORDER — ONDANSETRON HCL 4 MG/2ML IJ SOLN
INTRAMUSCULAR | Status: AC
  Filled 2021-08-14: qty 2

## 2021-08-14 MED ORDER — OXYCODONE-ACETAMINOPHEN 5-325 MG PO TABS
1.0000 | ORAL_TABLET | Freq: Four times a day (QID) | ORAL | 0 refills | Status: DC | PRN
  Filled 2021-08-14: qty 12, 3d supply, fill #0

## 2021-08-14 MED ORDER — CHLORHEXIDINE GLUCONATE 4 % EX LIQD: 60.0000 mL | Freq: Once | CUTANEOUS | Status: DC

## 2021-08-14 MED ORDER — LIDOCAINE-EPINEPHRINE (PF) 1 %-1:200000 IJ SOLN
INTRAMUSCULAR | Status: AC
  Filled 2021-08-14: qty 30

## 2021-08-14 MED ORDER — ORAL CARE MOUTH RINSE: 15.0000 mL | Freq: Once | OROMUCOSAL | Status: AC

## 2021-08-14 MED ORDER — ONDANSETRON HCL 4 MG/2ML IJ SOLN
INTRAMUSCULAR | Status: DC | PRN
  Administered 2021-08-14: 4 mg via INTRAVENOUS

## 2021-08-14 MED ORDER — LACTATED RINGERS IV SOLN: INTRAVENOUS | Status: DC

## 2021-08-14 MED ORDER — PROPOFOL 500 MG/50ML IV EMUL
INTRAVENOUS | Status: DC | PRN
  Administered 2021-08-14: 25 ug/kg/min via INTRAVENOUS

## 2021-08-14 MED ORDER — CEFAZOLIN SODIUM-DEXTROSE 2-4 GM/100ML-% IV SOLN
2.0000 g | INTRAVENOUS | Status: AC
  Administered 2021-08-14: 2 g via INTRAVENOUS
  Filled 2021-08-14: qty 100

## 2021-08-14 MED ORDER — FENTANYL CITRATE (PF) 100 MCG/2ML IJ SOLN: 25.0000 ug | INTRAMUSCULAR | Status: DC | PRN

## 2021-08-14 MED ORDER — INSULIN ASPART 100 UNIT/ML IJ SOLN: 0.0000 [IU] | INTRAMUSCULAR | Status: DC | PRN

## 2021-08-14 MED ORDER — FENTANYL CITRATE (PF) 250 MCG/5ML IJ SOLN
INTRAMUSCULAR | Status: DC | PRN
  Administered 2021-08-14: 50 ug via INTRAVENOUS

## 2021-08-14 MED ORDER — OXYCODONE HCL 5 MG PO TABS: 5.0000 mg | ORAL_TABLET | Freq: Once | ORAL | Status: DC | PRN

## 2021-08-14 MED ORDER — HEPARIN 6000 UNIT IRRIGATION SOLUTION
Status: AC
  Filled 2021-08-14: qty 500

## 2021-08-14 MED ORDER — CHLORHEXIDINE GLUCONATE 0.12 % MT SOLN: 15.0000 mL | Freq: Once | OROMUCOSAL | Status: AC

## 2021-08-14 MED ORDER — PHENYLEPHRINE HCL-NACL 20-0.9 MG/250ML-% IV SOLN
INTRAVENOUS | Status: DC | PRN
  Administered 2021-08-14: 50 ug/min via INTRAVENOUS

## 2021-08-14 MED ORDER — SODIUM CHLORIDE 0.9 % IV SOLN: INTRAVENOUS | Status: DC

## 2021-08-14 MED ORDER — CHLORHEXIDINE GLUCONATE 0.12 % MT SOLN
OROMUCOSAL | Status: AC
  Administered 2021-08-14: 15 mL via OROMUCOSAL
  Filled 2021-08-14: qty 15

## 2021-08-14 MED ORDER — ONDANSETRON HCL 4 MG/2ML IJ SOLN: 4.0000 mg | Freq: Once | INTRAMUSCULAR | Status: DC | PRN

## 2021-08-14 MED ORDER — HEPARIN 6000 UNIT IRRIGATION SOLUTION
Status: DC | PRN
  Administered 2021-08-14: 1

## 2021-08-14 MED ORDER — ROPIVACAINE HCL 5 MG/ML IJ SOLN
INTRAMUSCULAR | Status: DC | PRN
  Administered 2021-08-14: 30 mL via PERINEURAL

## 2021-08-14 SURGICAL SUPPLY — 33 items
ARMBAND PINK RESTRICT EXTREMIT (MISCELLANEOUS) ×2 IMPLANT
BAG COUNTER SPONGE SURGICOUNT (BAG) ×1 IMPLANT
CANISTER SUCT 3000ML PPV (MISCELLANEOUS) ×2 IMPLANT
CLIP LIGATING EXTRA MED SLVR (CLIP) ×2 IMPLANT
CLIP LIGATING EXTRA SM BLUE (MISCELLANEOUS) ×2 IMPLANT
COVER PROBE W GEL 5X96 (DRAPES) ×1 IMPLANT
DERMABOND ADVANCED (GAUZE/BANDAGES/DRESSINGS) ×1
DERMABOND ADVANCED .7 DNX12 (GAUZE/BANDAGES/DRESSINGS) ×1 IMPLANT
ELECT REM PT RETURN 9FT ADLT (ELECTROSURGICAL) ×2
ELECTRODE REM PT RTRN 9FT ADLT (ELECTROSURGICAL) ×1 IMPLANT
GLOVE BIO SURGEON STRL SZ7.5 (GLOVE) ×2 IMPLANT
GLOVE BIOGEL PI IND STRL 7.5 (GLOVE) IMPLANT
GLOVE BIOGEL PI INDICATOR 7.5 (GLOVE) ×1
GLOVE ECLIPSE 7.0 STRL STRAW (GLOVE) ×1 IMPLANT
GOWN STRL REUS W/ TWL LRG LVL3 (GOWN DISPOSABLE) ×2 IMPLANT
GOWN STRL REUS W/ TWL XL LVL3 (GOWN DISPOSABLE) ×1 IMPLANT
GOWN STRL REUS W/TWL LRG LVL3 (GOWN DISPOSABLE) ×4
GOWN STRL REUS W/TWL XL LVL3 (GOWN DISPOSABLE) ×2
INSERT FOGARTY SM (MISCELLANEOUS) ×1 IMPLANT
KIT BASIN OR (CUSTOM PROCEDURE TRAY) ×2 IMPLANT
KIT TURNOVER KIT B (KITS) ×2 IMPLANT
NS IRRIG 1000ML POUR BTL (IV SOLUTION) ×2 IMPLANT
PACK CV ACCESS (CUSTOM PROCEDURE TRAY) ×2 IMPLANT
PAD ARMBOARD 7.5X6 YLW CONV (MISCELLANEOUS) ×4 IMPLANT
SLING ARM FOAM STRAP LRG (SOFTGOODS) ×1 IMPLANT
SLING ARM FOAM STRAP MED (SOFTGOODS) IMPLANT
SUT MNCRL AB 4-0 PS2 18 (SUTURE) ×2 IMPLANT
SUT PROLENE 6 0 BV (SUTURE) ×3 IMPLANT
SUT VIC AB 3-0 SH 27 (SUTURE) ×2
SUT VIC AB 3-0 SH 27X BRD (SUTURE) ×1 IMPLANT
TOWEL GREEN STERILE (TOWEL DISPOSABLE) ×2 IMPLANT
UNDERPAD 30X36 HEAVY ABSORB (UNDERPADS AND DIAPERS) ×2 IMPLANT
WATER STERILE IRR 1000ML POUR (IV SOLUTION) ×2 IMPLANT

## 2021-08-14 NOTE — H&P (Signed)
?  ? ?HPI: ?  ?Rachel Hobbs is a 81 y.o. female end-stage renal disease currently dialyzes Tuesdays, Thursdays and Saturdays via right IJ tunnel catheter.  She has 2 previous fistulas in the left upper extremity failed and more recently a thrombosed brachial to axillary vein graft.  She is right-hand dominant.  She does have pain in the left hand that has been present since her most recent dialysis access she is hoping to keep access in the left hand.  No previous pacemakers. ?  ?    ?Past Medical History:  ?Diagnosis Date  ? Anemia of renal disease 06/16/2006  ?  Qualifier: Diagnosis of  By: Marinell Blight, Dawn    ? Arthritis    ? CHF (congestive heart failure) ([place])    ? Depression    ? Diabetes mellitus    ?  type 2  ? Hernia    ? Hyperlipidemia    ? Hypertension    ? Iron deficiency anemia    ? Low iron    ? Nodule of soft tissue 06/22/2012  ? Peripheral vascular disease ([place])    ?  in legs  ? Pneumonia    ?  teenager  ? Renal disorder    ?  CKD - dialysis T/TH/Sa  ? Shortness of breath dyspnea    ?  with exertion  ? SMALL BOWEL OBSTRUCTION, HX OF 02/15/2007  ?  Qualifier: Diagnosis of  By: [name], [place]    ?  ?     ?Family History  ?Problem Relation Age of Onset  ? Kidney disease Mother    ? Hypertension Mother    ? COPD Father    ?      smoke  ? Cancer Father    ?      Lung  ? Hypertension Father    ? Kidney disease Sister    ? Diabetes Daughter    ?  ?     ?Past Surgical History:  ?Procedure Laterality Date  ? ABDOMINAL HYSTERECTOMY      ?  in the 70's  ? AV FISTULA PLACEMENT Left 12/05/2015  ?  Procedure: RADIOCEPHALIC VERSUS BRACHIOCEPHALIC ARTERIOVENOUS (AV) FISTULA CREATION;  Surgeon: [name], MD;  Location: [facility] OR;  Service: Vascular;  Laterality: Left;  ? AV FISTULA PLACEMENT Left 05/20/2017  ?  Procedure: INSERTION OF ARTERIOVENOUS (AV) GORE-TEX VASCULAR STRETCH 4-7 GRAFT ARM LEFT UPPER ARM;  Surgeon: [name], MD;  Location: [place];  Service: Vascular;  Laterality: Left;  ? BASCILIC VEIN  TRANSPOSITION Left 06/04/2016  ?  Procedure: FIRST STAGE BASILIC VEIN TRANSPOSITION;  Surgeon: [name], MD;  Location: [place];  Service: Vascular;  Laterality: Left;  ? BASCILIC VEIN TRANSPOSITION Left 08/04/2016  ?  Procedure: LEFT UPPER ARM [place], SECOND STAGE;  Surgeon: [name], MD;  Location: [place];  Service: Vascular;  Laterality: Left;  ? HERNIA REPAIR      ?  umbilical in the 32'D  ? INSERTION OF DIALYSIS CATHETER Right 12/05/2015  ?  Procedure: INSERTION OF DIALYSIS CATHETER;  Surgeon: [name], MD;  Location: [facility] OR;  Service: Vascular;  Laterality: Right;  ? IR THROMBECTOMY AV FISTULA W/THROMBOLYSIS/PTA INC/SHUNT/IMG LEFT Left 05/09/2019  ? IR US GUIDE VASC ACCESS LEFT   05/09/2019  ?  ?  ?Short Social History:  ?Social History  ?  ?    ?Tobacco Use  ? Smoking status: Never  ? Smokeless  tobacco: Never  ?Substance Use Topics  ? Alcohol use: No  ?  ?  ?No Known Allergies ?  ?      ?Current Outpatient Medications  ?Medication Sig Dispense Refill  ? acetaminophen (TYLENOL) 500 MG tablet Take 500 mg by mouth every 6 (six) hours as needed for mild pain or headache.      ? aspirin 81 MG tablet Take 81 mg by mouth daily.      ? Aspirin-Calcium Carbonate 81-777 MG TABS Take by mouth.      ? atorvastatin (LIPITOR) 40 MG tablet TAKE 1 TABLET(40 MG) BY MOUTH DAILY AT 6 PM Strength: 40 mg 90 tablet 3  ? B Complex-C-Folic Acid (NEPHRO VITAMINS) 0.8 MG TABS Take 1 tablet by mouth daily.      ? bisacodyl (DULCOLAX) 5 MG EC tablet Take 5 mg by mouth daily as needed for moderate constipation.      ? Blood Glucose Monitoring Suppl (BLOOD GLUCOSE METER) kit Use as instructed 1 each 0  ? Blood Glucose Monitoring Suppl (ONETOUCH VERIO) w/Device KIT Use daily as indicated 1 kit 0  ? calcium acetate (PHOSLO) 667 MG capsule Take 667-1,334 mg by mouth See admin instructions. Take 1334 mg with each meal and 667 mg with each snack      ? ethyl chloride spray     0  ? gabapentin (NEURONTIN) 100  MG capsule Take 1 capsule (100 mg total) by mouth 3 (three) times daily. Take 100 mg in morning, and 200 mg at night 90 capsule 3  ? glucose blood (ONETOUCH VERIO) test strip Use as instructed 100 each 12  ? Insulin Pen Needle 31G X 8 MM MISC BD UltraFine III Pen Needles. For use with insulin pen device. Inject insulin 2X daily 100 each 3  ? Lancets (ONETOUCH ULTRASOFT) lancets Once daily testing plus prn for hypoglycemia 100 each 9  ? LANTUS SOLOSTAR 100 UNIT/ML Solostar Pen Inject 50 Units into the skin at bedtime. 15 mL 3  ? Multiple Vitamins-Minerals (QUIN B STRONG) TABS Take by mouth.      ? NEEDLE, DISP, 30 G (B-D DISP NEEDLE 30GX1") 30G X 1" MISC 1 each by Does not apply route daily. 100 each 2  ? ONE TOUCH ULTRA TEST test strip TEST BLOOD SUGAR EVERY DAY AND AS NEEDED FOR SYMPTOMS OF HYPOGLYCEMIA 100 each 0  ? ONETOUCH DELICA LANCETS 16X MISC Use daily as indicated 100 each 0  ? pregabalin (LYRICA) 75 MG capsule Take 1 capsule (75 mg total) by mouth 2 (two) times daily. 30 capsule 0  ?  ?         ?Current Facility-Administered Medications  ?Medication Dose Route Frequency Provider Last Rate Last Admin  ? oxymetazoline (AFRIN) 0.05 % nasal spray 1 spray  1 spray Each Nare Once Orvis Brill, DO      ?  ?  ?Review of Systems  ?Constitutional:  Constitutional negative. ?HENT: HENT negative.  ?Eyes: Eyes negative.  ?Respiratory: Respiratory negative.  ?Cardiovascular: Cardiovascular negative.  ?GI: Gastrointestinal negative.  ?Musculoskeletal:  ?     Left hand pain ?Neurological: Neurological negative. ?Hematologic: Hematologic/lymphatic negative.  ?Psychiatric: Psychiatric negative.   ?  ?   ?Objective:  ? ?Vitals:  ? 08/14/21 0605  ?BP: 137/64  ?Pulse: 91  ?Resp: 18  ?Temp: 97.7 ?F (36.5 ?C)  ?SpO2: 99%  ? ? ?Physical Exam ?HENT:  ?   Head: Normocephalic.  ?   Nose:  ?   Comments: Wearing a  mask ?Eyes:  ?   Pupils: Pupils are equal, round, and reactive to light.  ?Neck:  ?   Comments: Right IJ catheter in  place with dressing clean dry intact ?Cardiovascular:  ?   Pulses:     ?     Radial pulses are 2+ on the right side and 1+ on the left side.  ?Abdominal:  ?   General: Abdomen is flat.  ?   Palpations: Abdomen is soft.  ?Neurological:  ?   Mental Status: She is alert.  ?  ?  ?Data: ?No studies today ?    ?Assessment/Plan:  ?  ?81 year old female with end-stage renal disease currently dialyzing via catheter.  plan for left access today. ?  ?  ?Arriona Prest C. Donzetta Matters, MD ?[facility] ?Office: [phone] ?Pager: [phone] ? ?  ?

## 2021-08-14 NOTE — Anesthesia Postprocedure Evaluation (Signed)
Anesthesia Post Note ? ?Patient: Rachel Hobbs ? ?Procedure(s) Performed: LEFT ARM ARTERIOVENOUS (AV) FISTULA CREATION (Left) ? ?  ? ?Patient location during evaluation: PACU ?Anesthesia Type: Regional and MAC ?Level of consciousness: awake and alert and oriented ?Pain management: pain level controlled ?Vital Signs Assessment: post-procedure vital signs reviewed and stable ?Respiratory status: spontaneous breathing, nonlabored ventilation and respiratory function stable ?Cardiovascular status: stable and blood pressure returned to baseline ?Postop Assessment: no apparent nausea or vomiting ?Anesthetic complications: no ? ? ?No notable events documented. ? ?Last Vitals:  ?Vitals:  ? 08/14/21 0941 08/14/21 0956  ?BP: (!) 112/40 (!) 99/39  ?Pulse: 90 89  ?Resp: 15 16  ?Temp:    ?SpO2: 96% 96%  ?  ?Last Pain:  ?Vitals:  ? 08/14/21 0610  ?TempSrc:   ?PainSc: 0-No pain  ? ? ?  ?  ?  ?  ?  ?  ? ?Nicolette Gieske A. ? ? ? ? ?

## 2021-08-14 NOTE — Transfer of Care (Signed)
Immediate Anesthesia Transfer of Care Note ? ?Patient: Rachel Hobbs ? ?Procedure(s) Performed: LEFT ARM ARTERIOVENOUS (AV) FISTULA CREATION (Left) ? ?Patient Location: PACU ? ?Anesthesia Type:MAC combined with regional for post-op pain ? ?Level of Consciousness: awake, alert  and oriented ? ?Airway & Oxygen Therapy: Patient Spontanous Breathing ? ?Post-op Assessment: Report given to RN, Post -op Vital signs reviewed and stable and Patient able to stick tongue midline ? ?Post vital signs: Reviewed ? ?Last Vitals:  ?Vitals Value Taken Time  ?BP 109/43 08/14/21 0911  ?Temp 98.0   ?Pulse 87 08/14/21 0912  ?Resp 8 08/14/21 0912  ?SpO2 97 % 08/14/21 0912  ?Vitals shown include unvalidated device data. ? ?Last Pain:  ?Vitals:  ? 08/14/21 0610  ?TempSrc:   ?PainSc: 0-No pain  ?   ? ?  ? ?Complications: No notable events documented. ?

## 2021-08-14 NOTE — Anesthesia Procedure Notes (Signed)
Anesthesia Regional Block: Supraclavicular block  ? ?Pre-Anesthetic Checklist: , timeout performed,  Correct Patient, Correct Site, Correct Laterality,  Correct Procedure, Correct Position, site marked,  Risks and benefits discussed,  Surgical consent,  Pre-op evaluation,  At surgeon's request ? ?Laterality: Left ? ?Prep: chloraprep     ?  ?Needles:  ?Injection technique: Single-shot ? ?Needle Type: Echogenic Stimulator Needle   ? ? ?Needle Length: 10cm  ?Needle Gauge: 21  ? ?Needle insertion depth: 7 cm ? ? ?Additional Needles: ? ? ?Procedures:,,,, ultrasound used (permanent image in chart),,   ?Motor weakness within 10 minutes.  ?Narrative:  ?Start time: 08/14/2021 7:25 AM ?End time: 08/14/2021 7:30 AM ?Injection made incrementally with aspirations every 5 mL. ? ?Performed by: Personally  ?Anesthesiologist: Josephine Igo, MD ? ?Additional Notes: ?Timeout performed. Patient sedated. Relevant anatomy ID'd using [place]. Incremental 2-38ml injection of LA with frequent aspiration. Patient tolerated procedure well. ? ? ? ?Left Supraclavicular Block ? ?

## 2021-08-14 NOTE — Op Note (Signed)
? ? ?  Patient name: Rachel Hobbs MRN: [id number] DOB: 11-23-1940 Sex: female ? ?08/14/2021 ?Pre-operative Diagnosis: esrd ?Post-operative diagnosis:  Same ?Surgeon:  Eda Paschal. Donzetta Matters, MD ?Assistant: Laurence Slate, PA ?Procedure Performed:  Left brachial artery to cephalic vein AV fistula creation ? ?Indications: 81 year old female with history of end-stage renal disease.  She is on dialysis via catheter.  She has previously failed left upper extremity access.  She is now indicated for fistula versus graft creation after having undergone venogram. ? ?Findings: There is a very large cephalic vein running down the lateral aspect of her upper arm into the forearm.  I was able to mobilize this down in the forearm and create a fistula with out undue tension.  At completion there was a very strong thrill throughout the upper arm confirmed with Doppler. ?  ?Procedure:  The patient was identified in the holding area and taken to the operating room where she was placed supine operative table.  MAC anesthesia was induced.  A preoperative block of been placed.  She was sterilely prepped and draped in the left upper extremity in usual fashion, antibiotics were administered timeout was called.  Ultrasound was used to identify what appeared to be a suitable cephalic vein and brachial artery at the antecubital.  Transverse incision which was longer than usual was made between the vein and the artery.  First dissected down to the artery identified this and placed a vessel loop around this.  There was significant scar tissue around the artery there was 1 area that was injured and repaired with 6-0 Prolene suture.  I then dissected laterally identified the cephalic vein and mobilized this for several centimeters distally.  I then doubly clamped it and transected it.  I did have to remove part of it as it was too small.  Multiple branches were divided between clips and ties.  I then mobilized the fistula for several centimeters cephalad to  allow to reach to the artery.  I then clamped the artery distally proximally opened longitudinally and flushed with heparinized saline in distal direction.  The vein was then sewn end to side with 6-0 Prolene suture.  Upon completion we allowed flushing all directions.  We then had a very strong thrill although there was tension on the vein.  We freed this up for several centimeters dividing further branches more cephalad until there was no real tension on the fistula.  We then confirmed with Doppler that there was good flow this was traced up the upper arm.  We then irrigated the wound and obtain hemostasis and closed in layers of Vicryl and Monocryl.  She was awakened from anesthesia having tolerated procedure without any complication.  All counts were correct at completion ? ?Given the complexity of the case,  the assistant was necessary in order to expedient the procedure and safely perform the technical aspects of the operation.  The assistant provided traction and countertraction to assist with exposure of the artery and vein.  They also assisted with suture ligation of multiple venous branches.  They played a critical role in the anastomosis. These skills, especially following the Prolene suture for the anastomosis, could not have been adequately performed by a scrub tech assistant.  ? ? ?EBL: 50cc ? ? ?Rachel Sofranko C. Donzetta Matters, MD ?[facility] ?Office: [phone] ?Pager: [phone] ? ? ?

## 2021-08-14 NOTE — Discharge Instructions (Signed)
° °  [facility] [place] ° °Discharge Instructions ° °AV Fistula or Graft Surgery for Dialysis Access ° °Please refer to the following instructions for your post-procedure care. Your surgeon or physician assistant will discuss any changes with you. ° °Activity ° °You may drive the day following your surgery, if you are comfortable and no longer taking prescription pain medication. Resume full activity as the soreness in your incision resolves. ° °Bathing/Showering ° °You may shower after you go home. Keep your incision dry for 48 hours. Do not soak in a bathtub, hot tub, or swim until the incision heals completely. You may not shower if you have a hemodialysis catheter. ° °Incision Care ° °Clean your incision with mild soap and water after 48 hours. Pat the area dry with a clean towel. You do not need a bandage unless otherwise instructed. Do not apply any ointments or creams to your incision. You may have skin glue on your incision. Do not peel it off. It will come off on its own in about one week. Your arm may swell a bit after surgery. To reduce swelling use pillows to elevate your arm so it is above your heart. Your doctor will tell you if you need to lightly wrap your arm with an ACE bandage. ° °Diet ° °Resume your normal diet. There are not special food restrictions following this procedure. In order to heal from your surgery, it is CRITICAL to get adequate nutrition. Your body requires vitamins, minerals, and protein. Vegetables are the best source of vitamins and minerals. Vegetables also provide the perfect balance of protein. Processed food has little nutritional value, so try to avoid this. ° °Medications ° °Resume taking all of your medications. If your incision is causing pain, you may take over-the counter pain relievers such as acetaminophen (Tylenol). If you were prescribed a stronger pain medication, please be aware these medications can cause nausea and constipation. Prevent  nausea by taking the medication with a snack or meal. Avoid constipation by drinking plenty of fluids and eating foods with high amount of fiber, such as fruits, vegetables, and grains. Do not take Tylenol if you are taking prescription pain medications. ° ° ° ° °Follow up °Your surgeon may want to see you in the office following your access surgery. If so, this will be arranged at the time of your surgery. ° °Please call us immediately for any of the following conditions: ° °Increased pain, redness, drainage (pus) from your incision site °Fever of 101 degrees or higher °Severe or worsening pain at your incision site °Hand pain or numbness. ° °Reduce your risk of vascular disease: ° °Stop smoking. If you would like help, call [facility] at 1-800-QUIT-NOW (1-800-784-8669) or [place] at 336-586-4000 ° °Manage your cholesterol °Maintain a desired weight °Control your diabetes °Keep your blood pressure down ° °Dialysis ° °It will take several weeks to several months for your new dialysis access to be ready for use. Your surgeon will determine when it is OK to use it. Your nephrologist will continue to direct your dialysis. You can continue to use your Permcath until your new access is ready for use. ° °If you have any questions, please call the office at [phone]. ° °

## 2021-08-15 ENCOUNTER — Encounter (HOSPITAL_COMMUNITY): Payer: Self-pay | Admitting: Vascular Surgery

## 2021-08-27 ENCOUNTER — Telehealth: Payer: Self-pay

## 2021-08-27 NOTE — Telephone Encounter (Signed)
Pt called with c/o L hand/fingers feeling numb and pain since surgery. She was offered an appt early next week but refused it due to early time of day. She has been scheduled for mid week with APP and is aware to call us if anything changes/worsens.  ?

## 2021-09-01 ENCOUNTER — Telehealth: Payer: Self-pay

## 2021-09-01 NOTE — Telephone Encounter (Signed)
Pt called and left message on triage asking for a return call. No answer and VM not set up. Pt has an appt with APP in office tomorrow.  ?

## 2021-09-02 ENCOUNTER — Ambulatory Visit (INDEPENDENT_AMBULATORY_CARE_PROVIDER_SITE_OTHER): Payer: Medicare Other | Admitting: Physician Assistant

## 2021-09-02 ENCOUNTER — Other Ambulatory Visit: Payer: Self-pay

## 2021-09-02 VITALS — BP 132/77 | HR 82 | Temp 97.0°F | Resp 20 | Ht 62.0 in | Wt 225.0 lb

## 2021-09-02 DIAGNOSIS — N186 End stage renal disease: Secondary | ICD-10-CM

## 2021-09-02 NOTE — Progress Notes (Signed)
?Office Note  ? ? ? ?CC:  follow up ?Requesting Provider:  Donnal Moat* ? ?HPI: Rachel Hobbs is a 81 y.o. ([birth year]) female who presents status post left brachiocephalic fistula creation by Dr. Donzetta Matters on 08/14/2021.  Left arm incision has healed well.  Patient is complaining of cold feeling in left hand with numbness as well as pain.  She also has a weakened grip strength.  All symptoms have been present since surgery and do not appear to be improving.  She continues to dialyze via right IJ [facility] on a Tuesday Thursday Saturday schedule managed by Dr. [name]. ? ? ?Past Medical History:  ?Diagnosis Date  ? Anemia of renal disease 06/16/2006  ? Qualifier: Diagnosis of  By: Marinell Blight, Dawn    ? Arthritis   ? CHF (congestive heart failure) ([place])   ? Depression   ? Diabetes mellitus   ? type 2  ? Hernia   ? Hyperlipidemia   ? Hypertension   ? Iron deficiency anemia   ? Low iron   ? Nodule of soft tissue 06/22/2012  ? Peripheral vascular disease ([place])   ? in legs  ? Pneumonia   ? teenager  ? Renal disorder   ? CKD - dialysis T/TH/Sa  ? Shortness of breath dyspnea   ? with exertion  ? SMALL BOWEL OBSTRUCTION, HX OF 02/15/2007  ? Qualifier: Diagnosis of  By: [name], [place]    ? ? ?Past Surgical History:  ?Procedure Laterality Date  ? ABDOMINAL HYSTERECTOMY    ? in the 70's  ? AV FISTULA PLACEMENT Left 12/05/2015  ? Procedure: RADIOCEPHALIC VERSUS BRACHIOCEPHALIC ARTERIOVENOUS (AV) FISTULA CREATION;  Surgeon: [name], MD;  Location: [facility] OR;  Service: Vascular;  Laterality: Left;  ? AV FISTULA PLACEMENT Left 05/20/2017  ? Procedure: INSERTION OF ARTERIOVENOUS (AV) GORE-TEX VASCULAR STRETCH 4-7 GRAFT ARM LEFT UPPER ARM;  Surgeon: [name], MD;  Location: [place];  Service: Vascular;  Laterality: Left;  ? AV FISTULA PLACEMENT Left 08/14/2021  ? Procedure: LEFT ARM ARTERIOVENOUS (AV) FISTULA CREATION;  Surgeon: [name], MD;  Location: [place];  Service: Vascular;  Laterality: Left;  ?  BASCILIC VEIN TRANSPOSITION Left 06/04/2016  ? Procedure: FIRST STAGE BASILIC VEIN TRANSPOSITION;  Surgeon: [name], MD;  Location: [place];  Service: Vascular;  Laterality: Left;  ? BASCILIC VEIN TRANSPOSITION Left 08/04/2016  ? Procedure: LEFT UPPER ARM [place], SECOND STAGE;  Surgeon: [name], MD;  Location: [place];  Service: Vascular;  Laterality: Left;  ? HERNIA REPAIR    ? umbilical in the 33'K  ? INSERTION OF DIALYSIS CATHETER Right 12/05/2015  ? Procedure: INSERTION OF DIALYSIS CATHETER;  Surgeon: [name], MD;  Location: [facility] OR;  Service: Vascular;  Laterality: Right;  ? IR THROMBECTOMY AV FISTULA W/THROMBOLYSIS/PTA INC/SHUNT/IMG LEFT Left 05/09/2019  ? IR US GUIDE VASC ACCESS LEFT  05/09/2019  ? UPPER EXTREMITY VENOGRAPHY Bilateral 06/15/2021  ? Procedure: UPPER EXTREMITY VENOGRAPHY;  Surgeon: [name], MD;  Location: [place] [facility];  Service: Cardiovascular;  Laterality: Bilateral;  ? ? ?Social History  ? ?Socioeconomic History  ? Marital status: Divorced  ?  Spouse name: Not on file  ? Number of children: 1  ? Years of education: 82  ? Highest education level: 12th grade  ?Occupational History  ? Occupation: Retired- [employer]   ?  Employer: RETIRED  ?Tobacco Use  ? Smoking status: Never  ?  Passive exposure: Never  ? Smokeless tobacco: Never  ?Vaping Use  ? Vaping Use: Never used  ?Substance and Sexual Activity  ? Alcohol use: No  ? Drug use: No  ? Sexual activity: Not Currently  ?Other Topics Concern  ? Not on file  ?Social History Narrative  ? Patient lives with daughter Rachel Hobbs)  ? Important Relationships Daughter  ? 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 Interests / Fun: Sings in choir at [employer], going to [employer], and going to [employer]' games and school events   ? Current Stressors: Denies  ? Religious / Personal Beliefs: "I believe in God."   ?     ? *Updated 03/2020*  ?                                                                                               ?   ?   ?   ? ?Social Determinants of Health  ? ?Financial Resource Strain: Not on file  ?Food Insecurity: Not on file  ?Transportation Needs: Not on file  ?Physical Activity: Not on file  ?Stress: Not on file  ?Social Connections: Not on file  ?Intimate Partner Violence: Not on file  ? ? ?Family History  ?Problem Relation Age of Onset  ? Kidney disease Mother   ? Hypertension Mother   ? COPD Father   ?     smoke  ? Cancer Father   ?     Lung  ? Hypertension Father   ? Kidney disease Sister   ? Diabetes Daughter   ? ? ?Current Outpatient Medications  ?Medication Sig Dispense Refill  ? acetaminophen (TYLENOL) 500 MG tablet Take 500 mg by mouth every 6 (six) hours as needed for mild pain or headache.    ? aspirin 81 MG tablet Take 81 mg by mouth every other day.    ? atorvastatin (LIPITOR) 40 MG tablet TAKE 1 TABLET(40 MG) BY MOUTH DAILY AT 6 PM Strength: 40 mg 90 tablet 3  ? bisacodyl (DULCOLAX) 5 MG EC tablet Take 5 mg by mouth daily as needed for moderate constipation.    ? Blood Glucose Monitoring Suppl (BLOOD GLUCOSE METER) kit Use as instructed 1 each 0  ? Blood Glucose Monitoring Suppl (ONETOUCH VERIO) w/Device KIT Use daily as indicated 1 kit 0  ? calcium acetate (PHOSLO) 667 MG capsule Take 667-1,334 mg by mouth See admin instructions. Take 1334 mg with each meal and 667 mg with each snack    ? gabapentin (NEURONTIN) 100 MG capsule Take 1 capsule (100 mg total) by mouth 3 (three) times daily. Take 100 mg in morning, and 200 mg at night (Patient not taking: Reported on 06/12/2021) 90 capsule 3  ? glucose blood (ONETOUCH VERIO) test strip Use as instructed 100 each 12  ? Insulin Pen Needle 31G X 8 MM MISC BD UltraFine III Pen Needles. For use with insulin pen device. Inject insulin 2X daily 100 each 3  ? isosorbide mononitrate (IMDUR) 30 MG 24 hr tablet Take 30 mg by mouth  daily.    ? Lancets (ONETOUCH ULTRASOFT) lancets Once daily testing plus  prn for hypoglycemia 100 each 9  ? LANTUS SOLOSTAR 100 UNIT/ML Solostar Pen Inject 50 Units into the skin at bedtime. (Patient taking differently: Inject 40 Units into the skin every evening.) 15 mL 3  ? NEEDLE, DISP, 30 G (B-D DISP NEEDLE 30GX1") 30G X 1" MISC 1 each by Does not apply route daily. 100 each 2  ? ONE TOUCH ULTRA TEST test strip TEST BLOOD SUGAR EVERY DAY AND AS NEEDED FOR SYMPTOMS OF HYPOGLYCEMIA 100 each 0  ? ONETOUCH DELICA LANCETS 59E MISC Use daily as indicated 100 each 0  ? oxyCODONE-acetaminophen (PERCOCET/ROXICET) 5-325 MG tablet Take 1 tablet by mouth every 6 (six) hours as needed. 12 tablet 0  ? pregabalin (LYRICA) 75 MG capsule Take 1 capsule (75 mg total) by mouth 2 (two) times daily. (Patient not taking: Reported on 06/12/2021) 30 capsule 0  ? ?Current Facility-Administered Medications  ?Medication Dose Route Frequency Provider Last Rate Last Admin  ? 0.9 %  sodium chloride infusion  250 mL Intravenous PRN [name], MD      ? oxymetazoline (AFRIN) 0.05 % nasal spray 1 spray  1 spray Each Nare Once Dameron, Marisa, DO      ? sodium chloride flush (NS) 0.9 % injection 3 mL  3 mL Intravenous Q12H [name], MD      ? sodium chloride flush (NS) 0.9 % injection 3 mL  3 mL Intravenous PRN [name], MD      ? ? ?No Known Allergies ? ? ?REVIEW OF SYSTEMS:  ? ?_0  denotes positive finding, _1  denotes negative finding ?Cardiac  Comments:  ?Chest pain or chest pressure:    ?Shortness of breath upon exertion:    ?Short of breath when lying flat:    ?Irregular heart rhythm:    ?    ?Vascular    ?Pain in calf, thigh, or hip brought on by ambulation:    ?Pain in feet at night that wakes you up from your sleep:     ?Blood clot in your veins:    ?Leg swelling:     ?    ?Pulmonary    ?Oxygen at home:    ?Productive cough:     ?Wheezing:     ?    ?Neurologic    ?Sudden weakness in arms or legs:     ?Sudden numbness in arms or legs:     ?Sudden onset of  difficulty speaking or slurred speech:    ?Temporary loss of vision in one eye:     ?Problems with dizziness:     ?    ?Gastrointestinal    ?Blood in stool:     ?Vomited blood:     ?    ?Genitourinary    ?Burn

## 2021-09-02 NOTE — H&P (View-Only) (Signed)
Office Note     CC:  follow up Requesting Provider:  Donnal Hobbs*  HPI: Rachel Hobbs is a 81 y.o. (02-25-1941) female who presents status post left brachiocephalic fistula creation by Dr. Donzetta Hobbs on 08/14/2021.  Left arm incision has healed well.  Patient is complaining of cold feeling in left hand with numbness as well as pain.  She also has a weakened grip strength.  All symptoms have been present since surgery and do not appear to be improving.  She continues to dialyze via right IJ [facility] on a Tuesday Thursday Saturday schedule managed by Rachel Hobbs.   Past Medical History:  Diagnosis Date   Anemia of renal disease 06/16/2006   Qualifier: Diagnosis of  By: Rachel Hobbs, Rachel Hobbs     Arthritis    CHF (congestive heart failure) ([place])    Depression    Diabetes mellitus    type 2   Hernia    Hyperlipidemia    Hypertension    Iron deficiency anemia    Low iron    Nodule of soft tissue 06/22/2012   Peripheral vascular disease (HCC)    in legs   Pneumonia    teenager   Renal disorder    CKD - dialysis T/TH/Sa   Shortness of breath dyspnea    with exertion   SMALL BOWEL OBSTRUCTION, HX OF 02/15/2007   Qualifier: Diagnosis of  By: Rachel Morn Rachel Hobbs, Rachel Hobbs      Past Surgical History:  Procedure Laterality Date   ABDOMINAL HYSTERECTOMY     in the 70's   AV FISTULA PLACEMENT Left 12/05/2015   Procedure: RADIOCEPHALIC VERSUS BRACHIOCEPHALIC ARTERIOVENOUS (AV) FISTULA CREATION;  Surgeon: Rachel Dutch, Rachel Hobbs;  Location: [place];  Service: Vascular;  Laterality: Left;   AV FISTULA PLACEMENT Left 05/20/2017   Procedure: INSERTION OF ARTERIOVENOUS (AV) GORE-TEX VASCULAR STRETCH 4-7 GRAFT ARM LEFT UPPER ARM;  Surgeon: Rachel Posner, Rachel Hobbs;  Location: [place];  Service: Vascular;  Laterality: Left;   AV FISTULA PLACEMENT Left 08/14/2021   Procedure: LEFT ARM ARTERIOVENOUS (AV) FISTULA CREATION;  Surgeon: Rachel Sandy, Rachel Hobbs;  Location: [place];  Service: Vascular;  Laterality: Left;    [place] Left 06/04/2016   Procedure: FIRST STAGE BASILIC VEIN TRANSPOSITION;  Surgeon: [name], Rachel Hobbs;  Location: [place];  Service: Vascular;  Laterality: Left;   [place] Left 08/04/2016   Procedure: LEFT UPPER ARM [place], SECOND STAGE;  Surgeon: [name], Rachel Hobbs;  Location: [place];  Service: Vascular;  Laterality: Left;   HERNIA REPAIR     umbilical in the 02'R   INSERTION OF DIALYSIS CATHETER Right 12/05/2015   Procedure: INSERTION OF DIALYSIS CATHETER;  Surgeon: Rachel Dutch, Rachel Hobbs;  Location: [place];  Service: Vascular;  Laterality: Right;   IR THROMBECTOMY AV FISTULA W/THROMBOLYSIS/PTA INC/SHUNT/IMG LEFT Left 05/09/2019   IR US GUIDE VASC ACCESS LEFT  05/09/2019   UPPER EXTREMITY VENOGRAPHY Bilateral 06/15/2021   Procedure: UPPER EXTREMITY VENOGRAPHY;  Surgeon: Rachel Sandy, Rachel Hobbs;  Location: [place] [facility];  Service: Cardiovascular;  Laterality: Bilateral;    Social History   Socioeconomic History   Marital status: Divorced    Spouse name: Not on file   Number of children: 1   Years of education: 12   Highest education level: 12th grade  Occupational History   Occupation: Retired- [employer]     Employer: RETIRED  Tobacco Use   Smoking status: Never  Passive exposure: Never   Smokeless tobacco: Never  Vaping Use   Vaping Use: Never used  Substance and Sexual Activity   Alcohol use: No   Drug use: No   Sexual activity: Not Currently  Other Topics Concern   Not on file  Social History Narrative   Patient lives with daughter Rachel Hobbs)   Important Relationships Daughter   Education / Work:  74 th grade/ 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)    Interests / Fun: Sings in choir at [place], going to [employer], and going to Rachel Hobbs' games and school events    Current Stressors: Denies   Religious / Personal Beliefs: "I believe in God."         *Updated 03/2020*                                                                                                           Social Determinants of Health   Financial Resource Strain: Not on file  Food Insecurity: Not on file  Transportation Needs: Not on file  Physical Activity: Not on file  Stress: Not on file  Social Connections: Not on file  Intimate Partner Violence: Not on file    Family History  Problem Relation Age of Onset   Kidney disease Mother    Hypertension Mother    COPD Father        smoke   Cancer Father        Lung   Hypertension Father    Kidney disease Sister    Diabetes Daughter     Current Outpatient Medications  Medication Sig Dispense Refill   acetaminophen (TYLENOL) 500 MG tablet Take 500 mg by mouth every 6 (six) hours as needed for mild pain or headache.     aspirin 81 MG tablet Take 81 mg by mouth every other day.     atorvastatin (LIPITOR) 40 MG tablet TAKE 1 TABLET(40 MG) BY MOUTH DAILY AT 6 PM Strength: 40 mg 90 tablet 3   bisacodyl (DULCOLAX) 5 MG EC tablet Take 5 mg by mouth daily as needed for moderate constipation.     Blood Glucose Monitoring Suppl (BLOOD GLUCOSE METER) kit Use as instructed 1 each 0   Blood Glucose Monitoring Suppl (ONETOUCH VERIO) w/Device KIT Use daily as indicated 1 kit 0   calcium acetate (PHOSLO) 667 MG capsule Take 667-1,334 mg by mouth See admin instructions. Take 1334 mg with each meal and 667 mg with each snack     gabapentin (NEURONTIN) 100 MG capsule Take 1 capsule (100 mg total) by mouth 3 (three) times daily. Take 100 mg in morning, and 200 mg at night (Patient not taking: Reported on 06/12/2021) 90 capsule 3   glucose blood (ONETOUCH VERIO) test strip Use as instructed 100 each 12   Insulin Pen Needle 31G X 8 MM MISC BD UltraFine III Pen Needles. For use with insulin pen device. Inject insulin 2X daily 100 each 3   isosorbide mononitrate (IMDUR) 30 MG 24 hr tablet Take 30 mg by mouth  daily.     Lancets (ONETOUCH ULTRASOFT) lancets Once daily testing plus  prn for hypoglycemia 100 each 9   LANTUS SOLOSTAR 100 UNIT/ML Solostar Pen Inject 50 Units into the skin at bedtime. (Patient taking differently: Inject 40 Units into the skin every evening.) 15 mL 3   NEEDLE, DISP, 30 G (B-D DISP NEEDLE 30GX1") 30G X 1" MISC 1 each by Does not apply route daily. 100 each 2   ONE TOUCH ULTRA TEST test strip TEST BLOOD SUGAR EVERY DAY AND AS NEEDED FOR SYMPTOMS OF HYPOGLYCEMIA 100 each 0   ONETOUCH DELICA LANCETS 94W MISC Use daily as indicated 100 each 0   oxyCODONE-acetaminophen (PERCOCET/ROXICET) 5-325 MG tablet Take 1 tablet by mouth every 6 (six) hours as needed. 12 tablet 0   pregabalin (LYRICA) 75 MG capsule Take 1 capsule (75 mg total) by mouth 2 (two) times daily. (Patient not taking: Reported on 06/12/2021) 30 capsule 0   Current Facility-Administered Medications  Medication Dose Route Frequency Provider Last Rate Last Admin   0.9 %  sodium chloride infusion  250 mL Intravenous PRN Rachel Sandy, Rachel Hobbs       oxymetazoline (AFRIN) 0.05 % nasal spray 1 spray  1 spray Each Nare Once Dameron, Marisa, DO       sodium chloride flush (NS) 0.9 % injection 3 mL  3 mL Intravenous Q12H Rachel Sandy, Rachel Hobbs       sodium chloride flush (NS) 0.9 % injection 3 mL  3 mL Intravenous PRN Rachel Sandy, Rachel Hobbs        No Known Allergies   REVIEW OF SYSTEMS:   [X]  denotes positive finding, [ ]  denotes negative finding Cardiac  Comments:  Chest pain or chest pressure:    Shortness of breath upon exertion:    Short of breath when lying flat:    Irregular heart rhythm:        Vascular    Pain in calf, thigh, or hip brought on by ambulation:    Pain in feet at night that wakes you up from your sleep:     Blood clot in your veins:    Leg swelling:         Pulmonary    Oxygen at home:    Productive cough:     Wheezing:         Neurologic    Sudden weakness in arms or legs:     Sudden numbness in arms or legs:     Sudden onset of  difficulty speaking or slurred speech:    Temporary loss of vision in one eye:     Problems with dizziness:         Gastrointestinal    Blood in stool:     Vomited blood:         Genitourinary    Burning when urinating:     Blood in urine:        Psychiatric    Major depression:         Hematologic    Bleeding problems:    Problems with blood clotting too easily:        Skin    Rashes or ulcers:        Constitutional    Fever or chills:      PHYSICAL EXAMINATION:  Vitals:   09/02/21 0959  BP: 132/77  Pulse: 82  Resp: 20  Temp: (!) 97 F (36.1 C)  TempSrc: Temporal  SpO2: 100%  Weight: 225  lb (102.1 kg)  Height: 5' 2"  (1.575 m)    General:  WDWN in NAD; vital signs documented above Gait: Not observed HENT: WNL, normocephalic Pulmonary: normal non-labored breathing , without Rales, rhonchi,  wheezing Cardiac: regular HR Abdomen: soft, NT, no masses Skin: without rashes Vascular Exam/Pulses: Palpable thrill throughout left cephalic vein fistula; monophasic left radial signal by Doppler augmented with compression of the fistula; absent left ulnar and palmar arch signal.  Ulnar and palmar arch are monophasic with compression of the fistula Extremities: Grip strength deficit left hand; left hand cold to touch with mild motor and sensory loss Musculoskeletal: no muscle wasting or atrophy  Neurologic: A&O X 3;  No focal weakness or paresthesias are detected Psychiatric:  The pt has Normal affect.   ASSESSMENT/PLAN:: 81 y.o. female here for evaluation of steal syndrome of left hand status post left brachiocephalic fistula creation  -Patient has a steal syndrome of the left hand status post left brachiocephalic fistula creation.  Patient was also evaluated by Dr. Donzetta Hobbs during office visit today.  Plan will be for ligation of left arm AV fistula in the OR on Friday, 09/04/2021.  Case was discussed with the patient and her daughter who agreed to proceed.   Dagoberto Ligas, PA-C [facility] [phone]  Clinic Rachel Hobbs:   Donzetta Hobbs

## 2021-09-03 ENCOUNTER — Other Ambulatory Visit: Payer: Self-pay

## 2021-09-03 ENCOUNTER — Encounter (HOSPITAL_COMMUNITY): Payer: Self-pay | Admitting: Vascular Surgery

## 2021-09-03 NOTE — Progress Notes (Signed)
Spoke with pt for pre-op call. Pt has history of HTN, Diabetes and CKD. Pt states her last A1C was 8.0 about 3 months ago. States she has run out of supplies for her CBG meter. Instructed pt to take 1/2 of her regular dose of Lantus tonight, she will take 20 units.   Dr. Claretha Cooper office ok'ed pt to eat breakfast prior to 6:15 AM day of surgery since her surgery is in the afternoon. Pt voiced understanding.  Shower/bath instructions given to pt.

## 2021-09-04 ENCOUNTER — Encounter (HOSPITAL_COMMUNITY): Payer: Self-pay | Admitting: Vascular Surgery

## 2021-09-04 ENCOUNTER — Ambulatory Visit (HOSPITAL_COMMUNITY)
Admission: RE | Admit: 2021-09-04 | Discharge: 2021-09-04 | Disposition: A | Discharge status: Home / Self Care | Payer: Medicare Other | Source: Ambulatory Visit | Attending: Vascular Surgery | Admitting: Vascular Surgery

## 2021-09-04 ENCOUNTER — Ambulatory Visit (HOSPITAL_COMMUNITY): Payer: Medicare Other | Admitting: Certified Registered Nurse Anesthetist

## 2021-09-04 ENCOUNTER — Other Ambulatory Visit: Payer: Self-pay

## 2021-09-04 ENCOUNTER — Encounter (HOSPITAL_COMMUNITY)
Admission: RE | Disposition: A | Discharge status: Home / Self Care | Payer: Self-pay | Source: Ambulatory Visit | Attending: Vascular Surgery

## 2021-09-04 ENCOUNTER — Ambulatory Visit (HOSPITAL_BASED_OUTPATIENT_CLINIC_OR_DEPARTMENT_OTHER): Payer: Medicare Other | Admitting: Certified Registered Nurse Anesthetist

## 2021-09-04 DIAGNOSIS — I509 Heart failure, unspecified: Secondary | ICD-10-CM | POA: Diagnosis not present

## 2021-09-04 DIAGNOSIS — N186 End stage renal disease: Secondary | ICD-10-CM

## 2021-09-04 DIAGNOSIS — T82898A Other specified complication of vascular prosthetic devices, implants and grafts, initial encounter: Secondary | ICD-10-CM | POA: Insufficient documentation

## 2021-09-04 DIAGNOSIS — Z6841 Body Mass Index (BMI) 40.0 and over, adult: Secondary | ICD-10-CM | POA: Insufficient documentation

## 2021-09-04 DIAGNOSIS — Z95828 Presence of other vascular implants and grafts: Secondary | ICD-10-CM | POA: Insufficient documentation

## 2021-09-04 DIAGNOSIS — E1122 Type 2 diabetes mellitus with diabetic chronic kidney disease: Secondary | ICD-10-CM

## 2021-09-04 DIAGNOSIS — Y832 Surgical operation with anastomosis, bypass or graft as the cause of abnormal reaction of the patient, or of later complication, without mention of misadventure at the time of the procedure: Secondary | ICD-10-CM | POA: Insufficient documentation

## 2021-09-04 DIAGNOSIS — E669 Obesity, unspecified: Secondary | ICD-10-CM | POA: Insufficient documentation

## 2021-09-04 DIAGNOSIS — I132 Hypertensive heart and chronic kidney disease with heart failure and with stage 5 chronic kidney disease, or end stage renal disease: Secondary | ICD-10-CM | POA: Insufficient documentation

## 2021-09-04 DIAGNOSIS — Z992 Dependence on renal dialysis: Secondary | ICD-10-CM | POA: Insufficient documentation

## 2021-09-04 DIAGNOSIS — Z794 Long term (current) use of insulin: Secondary | ICD-10-CM | POA: Diagnosis not present

## 2021-09-04 DIAGNOSIS — N185 Chronic kidney disease, stage 5: Secondary | ICD-10-CM | POA: Diagnosis not present

## 2021-09-04 HISTORY — PX: LIGATION OF COMPETING BRANCHES OF ARTERIOVENOUS FISTULA: SHX5949

## 2021-09-04 LAB — POCT I-STAT, CHEM 8
BUN: 25 mg/dL — ABNORMAL HIGH (ref 8–23)
Calcium, Ion: 1.03 mmol/L — ABNORMAL LOW (ref 1.15–1.40)
Chloride: 96 mmol/L — ABNORMAL LOW (ref 98–111)
Creatinine, Ser: 7.4 mg/dL — ABNORMAL HIGH (ref 0.44–1.00)
Glucose, Bld: 203 mg/dL — ABNORMAL HIGH (ref 70–99)
HCT: 35 % — ABNORMAL LOW (ref 36.0–46.0)
Hemoglobin: 11.9 g/dL — ABNORMAL LOW (ref 12.0–15.0)
Potassium: 3.7 mmol/L (ref 3.5–5.1)
Sodium: 134 mmol/L — ABNORMAL LOW (ref 135–145)
TCO2: 28 mmol/L (ref 22–32)

## 2021-09-04 LAB — GLUCOSE, CAPILLARY
Glucose-Capillary: 197 mg/dL — ABNORMAL HIGH (ref 70–99)
Glucose-Capillary: 162 mg/dL — ABNORMAL HIGH (ref 70–99)
Glucose-Capillary: 177 mg/dL — ABNORMAL HIGH (ref 70–99)

## 2021-09-04 SURGERY — LIGATION OF COMPETING BRANCHES OF ARTERIOVENOUS FISTULA
Anesthesia: Monitor Anesthesia Care | Site: Arm Lower | Laterality: Left

## 2021-09-04 MED ORDER — INSULIN ASPART 100 UNIT/ML IJ SOLN: 0.0000 [IU] | INTRAMUSCULAR | Status: DC | PRN

## 2021-09-04 MED ORDER — PROPOFOL 500 MG/50ML IV EMUL
INTRAVENOUS | Status: DC | PRN
Start: 2021-09-04 — End: 2021-09-04
  Administered 2021-09-04: 50 ug/kg/min via INTRAVENOUS

## 2021-09-04 MED ORDER — ONDANSETRON HCL 4 MG/2ML IJ SOLN
INTRAMUSCULAR | Status: DC | PRN
Start: 2021-09-04 — End: 2021-09-04
  Administered 2021-09-04: 4 mg via INTRAVENOUS

## 2021-09-04 MED ORDER — LIDOCAINE-EPINEPHRINE (PF) 1 %-1:200000 IJ SOLN
INTRAMUSCULAR | Status: DC | PRN
  Administered 2021-09-04: 8 mL

## 2021-09-04 MED ORDER — 0.9 % SODIUM CHLORIDE (POUR BTL) OPTIME
TOPICAL | Status: DC | PRN
  Administered 2021-09-04: 1000 mL

## 2021-09-04 MED ORDER — FENTANYL CITRATE (PF) 250 MCG/5ML IJ SOLN
INTRAMUSCULAR | Status: AC
  Filled 2021-09-04: qty 5

## 2021-09-04 MED ORDER — CEFAZOLIN SODIUM-DEXTROSE 2-4 GM/100ML-% IV SOLN
2.0000 g | INTRAVENOUS | Status: AC
  Administered 2021-09-04: 2 g via INTRAVENOUS
  Filled 2021-09-04: qty 100

## 2021-09-04 MED ORDER — FENTANYL CITRATE (PF) 100 MCG/2ML IJ SOLN
25.0000 ug | INTRAMUSCULAR | Status: DC | PRN
  Administered 2021-09-04 (×2): 25 ug via INTRAVENOUS

## 2021-09-04 MED ORDER — MEPERIDINE HCL 25 MG/ML IJ SOLN: 6.2500 mg | INTRAMUSCULAR | Status: DC | PRN

## 2021-09-04 MED ORDER — ORAL CARE MOUTH RINSE: 15.0000 mL | Freq: Once | OROMUCOSAL | Status: AC

## 2021-09-04 MED ORDER — ONDANSETRON HCL 4 MG/2ML IJ SOLN
INTRAMUSCULAR | Status: AC
  Filled 2021-09-04: qty 4

## 2021-09-04 MED ORDER — OXYCODONE-ACETAMINOPHEN 5-325 MG PO TABS: 1.0000 | ORAL_TABLET | Freq: Four times a day (QID) | ORAL | 0 refills | Status: DC | PRN

## 2021-09-04 MED ORDER — LIDOCAINE 2% (20 MG/ML) 5 ML SYRINGE
INTRAMUSCULAR | Status: DC | PRN
  Administered 2021-09-04: 60 mg via INTRAVENOUS

## 2021-09-04 MED ORDER — CHLORHEXIDINE GLUCONATE 4 % EX LIQD: 60.0000 mL | Freq: Once | CUTANEOUS | Status: DC

## 2021-09-04 MED ORDER — SODIUM CHLORIDE 0.9 % IV SOLN: INTRAVENOUS | Status: DC

## 2021-09-04 MED ORDER — FENTANYL CITRATE (PF) 250 MCG/5ML IJ SOLN
INTRAMUSCULAR | Status: DC | PRN
  Administered 2021-09-04: 50 ug via INTRAVENOUS

## 2021-09-04 MED ORDER — PHENYLEPHRINE 80 MCG/ML (10ML) SYRINGE FOR IV PUSH (FOR BLOOD PRESSURE SUPPORT)
PREFILLED_SYRINGE | INTRAVENOUS | Status: AC
  Filled 2021-09-04: qty 10

## 2021-09-04 MED ORDER — LIDOCAINE-EPINEPHRINE (PF) 1 %-1:200000 IJ SOLN
INTRAMUSCULAR | Status: AC
  Filled 2021-09-04: qty 30

## 2021-09-04 MED ORDER — OXYCODONE HCL 5 MG PO TABS: 5.0000 mg | ORAL_TABLET | Freq: Once | ORAL | Status: DC | PRN

## 2021-09-04 MED ORDER — FENTANYL CITRATE (PF) 100 MCG/2ML IJ SOLN
INTRAMUSCULAR | Status: AC
  Filled 2021-09-04: qty 2

## 2021-09-04 MED ORDER — CHLORHEXIDINE GLUCONATE 0.12 % MT SOLN
15.0000 mL | Freq: Once | OROMUCOSAL | Status: AC
  Administered 2021-09-04: 15 mL via OROMUCOSAL
  Filled 2021-09-04: qty 15

## 2021-09-04 MED ORDER — OXYCODONE HCL 5 MG/5ML PO SOLN: 5.0000 mg | Freq: Once | ORAL | Status: DC | PRN

## 2021-09-04 MED ORDER — MIDAZOLAM HCL 2 MG/2ML IJ SOLN: 0.5000 mg | Freq: Once | INTRAMUSCULAR | Status: DC | PRN

## 2021-09-04 SURGICAL SUPPLY — 33 items
ADH SKN CLS APL DERMABOND .7 (GAUZE/BANDAGES/DRESSINGS) ×1
ADH SKN CLS LQ APL DERMABOND (GAUZE/BANDAGES/DRESSINGS) ×1
BAG COUNTER SPONGE SURGICOUNT (BAG) ×2 IMPLANT
BAG SPNG CNTER NS LX DISP (BAG) ×1
CANISTER SUCT 3000ML PPV (MISCELLANEOUS) ×2 IMPLANT
CLIP LIGATING EXTRA MED SLVR (CLIP) ×2 IMPLANT
CLIP LIGATING EXTRA SM BLUE (MISCELLANEOUS) ×2 IMPLANT
COVER PROBE W GEL 5X96 (DRAPES) IMPLANT
COVER PROBE W/GEL STERILE (IV SETS) ×1 IMPLANT
DERMABOND ADHESIVE PROPEN (GAUZE/BANDAGES/DRESSINGS) ×1
DERMABOND ADVANCED (GAUZE/BANDAGES/DRESSINGS) ×1
DERMABOND ADVANCED .7 DNX12 (GAUZE/BANDAGES/DRESSINGS) ×1 IMPLANT
DERMABOND ADVANCED .7 DNX6 (GAUZE/BANDAGES/DRESSINGS) IMPLANT
ELECT REM PT RETURN 9FT ADLT (ELECTROSURGICAL) ×2
ELECTRODE REM PT RTRN 9FT ADLT (ELECTROSURGICAL) ×1 IMPLANT
GLOVE BIO SURGEON STRL SZ7.5 (GLOVE) ×2 IMPLANT
GOWN STRL REUS W/ TWL LRG LVL3 (GOWN DISPOSABLE) ×2 IMPLANT
GOWN STRL REUS W/ TWL XL LVL3 (GOWN DISPOSABLE) ×1 IMPLANT
GOWN STRL REUS W/TWL LRG LVL3 (GOWN DISPOSABLE) ×4
GOWN STRL REUS W/TWL XL LVL3 (GOWN DISPOSABLE) ×2
KIT BASIN OR (CUSTOM PROCEDURE TRAY) ×2 IMPLANT
KIT TURNOVER KIT B (KITS) IMPLANT
NS IRRIG 1000ML POUR BTL (IV SOLUTION) ×2 IMPLANT
PACK CV ACCESS (CUSTOM PROCEDURE TRAY) ×2 IMPLANT
PAD ARMBOARD 7.5X6 YLW CONV (MISCELLANEOUS) ×4 IMPLANT
SUT MNCRL AB 4-0 PS2 18 (SUTURE) ×2 IMPLANT
SUT PROLENE 6 0 BV (SUTURE) ×1 IMPLANT
SUT SILK 0 TIES 10X30 (SUTURE) ×2 IMPLANT
SUT VIC AB 3-0 SH 27 (SUTURE) ×2
SUT VIC AB 3-0 SH 27X BRD (SUTURE) ×1 IMPLANT
TOWEL GREEN STERILE (TOWEL DISPOSABLE) ×2 IMPLANT
UNDERPAD 30X36 HEAVY ABSORB (UNDERPADS AND DIAPERS) ×2 IMPLANT
WATER STERILE IRR 1000ML POUR (IV SOLUTION) ×2 IMPLANT

## 2021-09-04 NOTE — Interval H&P Note (Signed)
History and Physical Interval Note:  09/04/2021 1:38 PM  Rachel Hobbs  has presented today for surgery, with the diagnosis of ESRD N18.6.  The various methods of treatment have been discussed with the patient and family. After consideration of risks, benefits and other options for treatment, the patient has consented to  Procedure(s): LEFT ARM FISTULA LIGATION (Left) as a surgical intervention.  The patient's history has been reviewed, patient examined, no change in status, stable for surgery.  I have reviewed the patient's chart and labs.  Questions were answered to the patient's satisfaction.     Servando Snare

## 2021-09-04 NOTE — Discharge Instructions (Signed)
   [facility]  Discharge Instructions  AV Fistula or Graft Surgery for Dialysis Access  Please refer to the following instructions for your post-procedure care. Your surgeon or physician assistant will discuss any changes with you.  Activity  You may drive the day following your surgery, if you are comfortable and no longer taking prescription pain medication. Resume full activity as the soreness in your incision resolves.  Bathing/Showering  You may shower after you go home. Keep your incision dry for 48 hours. Do not soak in a bathtub, hot tub, or swim until the incision heals completely. You may not shower if you have a hemodialysis catheter.  Incision Care  Clean your incision with mild soap and water after 48 hours. Pat the area dry with a clean towel. You do not need a bandage unless otherwise instructed. Do not apply any ointments or creams to your incision. You may have skin glue on your incision. Do not peel it off. It will come off on its own in about one week. Your arm may swell a bit after surgery. To reduce swelling use pillows to elevate your arm so it is above your heart. Your doctor will tell you if you need to lightly wrap your arm with an ACE bandage.  Diet  Resume your normal diet. There are not special food restrictions following this procedure. In order to heal from your surgery, it is CRITICAL to get adequate nutrition. Your body requires vitamins, minerals, and protein. Vegetables are the best source of vitamins and minerals. Vegetables also provide the perfect balance of protein. Processed food has little nutritional value, so try to avoid this.  Medications  Resume taking all of your medications. If your incision is causing pain, you may take over-the counter pain relievers such as acetaminophen (Tylenol). If you were prescribed a stronger pain medication, please be aware these medications can cause nausea and constipation. Prevent  nausea by taking the medication with a snack or meal. Avoid constipation by drinking plenty of fluids and eating foods with high amount of fiber, such as fruits, vegetables, and grains.   Do not take Tylenol if you are taking prescription pain medications.  Follow up Your surgeon may want to see you in the office following your access surgery. If so, this will be arranged at the time of your surgery.  Please call us immediately for any of the following conditions:  Increased pain, redness, drainage (pus) from your incision site Fever of 101 degrees or higher Severe or worsening pain at your incision site Hand pain or numbness.  Reduce your risk of vascular disease:  Stop smoking. If you would like help, call [facility] at 1-800-QUIT-NOW 407-718-8694) or [place] at [place] your cholesterol Maintain a desired weight Control your diabetes Keep your blood pressure down  Dialysis  It will take several weeks to several months for your new dialysis access to be ready for use. Your surgeon will determine when it is okay to use it. Your nephrologist will continue to direct your dialysis. You can continue to use your Permcath until your new access is ready for use.   09/04/2021 Rachel Hobbs [id number] 09-Sep-1940  Surgeon(s): [name], MD  Procedure(s): LIGATION OF LEFT ARM CEPHALIC VEIN   If you have any questions, please call the office at [phone].

## 2021-09-04 NOTE — Anesthesia Preprocedure Evaluation (Addendum)
Anesthesia Evaluation  Patient identified by MRN, date of birth, ID band Patient awake    Reviewed: Allergy & Precautions, NPO status , Patient's Chart, lab work & pertinent test results  History of Anesthesia Complications Negative for: history of anesthetic complications  Airway Mallampati: I  TM Distance: >3 FB Neck ROM: Full    Dental  (+) Edentulous Upper, Edentulous Lower   Pulmonary shortness of breath (uses O2 with dialysis sometimes),    breath sounds clear to auscultation       Cardiovascular hypertension, +CHF   Rhythm:Regular Rate:Normal  '22 ECHO: EF 66%, normal LVF, mod LVH, Grade 1 DD, normal RVF, no significant valvular abnormalities   Neuro/Psych Depression negative neurological ROS     GI/Hepatic negative GI ROS, Neg liver ROS,   Endo/Other  diabetes (glu 162), Insulin DependentMorbid obesity  Renal/GU ESRF and DialysisRenal disease (K+ 3.7)     Musculoskeletal  (+) Arthritis ,   Abdominal (+) + obese,   Peds  Hematology  (+) Blood dyscrasia (Hb 11.9), anemia ,   Anesthesia Other Findings   Reproductive/Obstetrics                            Anesthesia Physical Anesthesia Plan  ASA: 3  Anesthesia Plan: MAC   Post-op Pain Management: Minimal or no pain anticipated   Induction:   PONV Risk Score and Plan: 2 and Ondansetron and Treatment may vary due to age or medical condition  Airway Management Planned: Simple Face Mask and Natural Airway  Additional Equipment: None  Intra-op Plan:   Post-operative Plan:   Informed Consent: I have reviewed the patients History and Physical, chart, labs and discussed the procedure including the risks, benefits and alternatives for the proposed anesthesia with the patient or authorized representative who has indicated his/her understanding and acceptance.     Dental advisory given  Plan Discussed with: CRNA and  Surgeon  Anesthesia Plan Comments:        Anesthesia Quick Evaluation

## 2021-09-04 NOTE — Transfer of Care (Signed)
Immediate Anesthesia Transfer of Care Note  Patient: Rachel Hobbs  Procedure(s) Performed: LIGATION OF LEFT ARM CEPHALIC VEIN (Left: Arm Lower)  Patient Location: PACU  Anesthesia Type:MAC  Level of Consciousness: awake, alert  and oriented  Airway & Oxygen Therapy: Patient Spontanous Breathing  Post-op Assessment: Report given to RN and Post -op Vital signs reviewed and stable  Post vital signs: Reviewed and stable  Last Vitals:  Vitals Value Taken Time  BP 177/78 09/04/21 1529  Temp    Pulse 82 09/04/21 1532  Resp 7 09/04/21 1532  SpO2 96 % 09/04/21 1532  Vitals shown include unvalidated device data.  Last Pain:  Vitals:   09/04/21 1318  PainSc: 8       Patients Stated Pain Goal: 0 (98/92/11 9417)  Complications: No notable events documented.

## 2021-09-04 NOTE — Progress Notes (Signed)
Patients CBG 197. Blood sugars are hard to control. CBG could bottom out, so no diabetic protocol was started in pre-op. Dr. Glennon Mac aware and agrees.

## 2021-09-04 NOTE — Op Note (Signed)
    Patient name: Rachel Hobbs MRN: [id number] DOB: March 22, 1941 Sex: female  09/04/2021 Pre-operative Diagnosis: end stage renal disease, left upper extremity steal syndrom Post-operative diagnosis:  Same Surgeon:  Eda Paschal. Donzetta Matters, MD Assistant: Paulo Fruit, PA Procedure Performed:  ligation left cephalic vein AV fistula  Indications: 81 year old female with recent left arm AV fistula creation.  She now has severe steal and is indicated for ligation.  Findings: Fistula was very strong at completion there was a strong radial artery signal after the fistula was ligated.   Procedure:  The patient was identified in the holding area and taken to the operating room where she was placed upon the operative table and MAC anesthesia was induced.  She was sterilely prepped draped in left upper extremity usual fashion, antibiotics were minister timeout was called.  The previous incision was anesthetized with 1% lidocaine.  The incision was opened the fistula was identified and clamped there was a good radial artery signal.  This was tied off distally and divided.  I then oversewed the proximal aspect with 5-0 Prolene suture in a mattress fashion.  Upon completion there was a good radial artery signal again.  The wound was irrigated closed in layers of Vicryl and Monocryl.  Dermabond is placed at the skin level.  She was awakened from anesthesia having tolerated procedure without immediate complication.  Given the complexity of the case,  the assistant was necessary in order to expedite the procedure.  Specifically she assisted with exposing the fistula with tension and counter tension and by following the Prolene suture as I ligated the fistula.  EBL: 10 cc    [name] C. Donzetta Matters, MD [facility] [place] Office: [phone] Pager: [phone]

## 2021-09-06 ENCOUNTER — Encounter (HOSPITAL_COMMUNITY): Payer: Self-pay | Admitting: Vascular Surgery

## 2021-09-07 ENCOUNTER — Other Ambulatory Visit: Payer: Self-pay

## 2021-09-07 ENCOUNTER — Encounter (HOSPITAL_COMMUNITY): Payer: Self-pay | Admitting: Emergency Medicine

## 2021-09-07 ENCOUNTER — Emergency Department (HOSPITAL_BASED_OUTPATIENT_CLINIC_OR_DEPARTMENT_OTHER): Payer: Medicare Other

## 2021-09-07 ENCOUNTER — Emergency Department (HOSPITAL_COMMUNITY)
Admission: EM | Admit: 2021-09-07 | Discharge: 2021-09-07 | Disposition: A | Discharge status: Home / Self Care | Payer: Medicare Other | Attending: Student | Admitting: Student

## 2021-09-07 DIAGNOSIS — N186 End stage renal disease: Secondary | ICD-10-CM | POA: Insufficient documentation

## 2021-09-07 DIAGNOSIS — I132 Hypertensive heart and chronic kidney disease with heart failure and with stage 5 chronic kidney disease, or end stage renal disease: Secondary | ICD-10-CM | POA: Insufficient documentation

## 2021-09-07 DIAGNOSIS — Z794 Long term (current) use of insulin: Secondary | ICD-10-CM | POA: Diagnosis not present

## 2021-09-07 DIAGNOSIS — Z992 Dependence on renal dialysis: Secondary | ICD-10-CM | POA: Insufficient documentation

## 2021-09-07 DIAGNOSIS — I5030 Unspecified diastolic (congestive) heart failure: Secondary | ICD-10-CM | POA: Diagnosis not present

## 2021-09-07 DIAGNOSIS — R609 Edema, unspecified: Secondary | ICD-10-CM | POA: Diagnosis present

## 2021-09-07 DIAGNOSIS — M7989 Other specified soft tissue disorders: Secondary | ICD-10-CM | POA: Diagnosis not present

## 2021-09-07 DIAGNOSIS — M79602 Pain in left arm: Secondary | ICD-10-CM | POA: Diagnosis not present

## 2021-09-07 DIAGNOSIS — Z8616 Personal history of COVID-19: Secondary | ICD-10-CM | POA: Diagnosis not present

## 2021-09-07 DIAGNOSIS — M79622 Pain in left upper arm: Secondary | ICD-10-CM | POA: Insufficient documentation

## 2021-09-07 DIAGNOSIS — E1122 Type 2 diabetes mellitus with diabetic chronic kidney disease: Secondary | ICD-10-CM | POA: Insufficient documentation

## 2021-09-07 DIAGNOSIS — Z7982 Long term (current) use of aspirin: Secondary | ICD-10-CM | POA: Insufficient documentation

## 2021-09-07 DIAGNOSIS — E1140 Type 2 diabetes mellitus with diabetic neuropathy, unspecified: Secondary | ICD-10-CM | POA: Diagnosis not present

## 2021-09-07 DIAGNOSIS — Z79899 Other long term (current) drug therapy: Secondary | ICD-10-CM | POA: Insufficient documentation

## 2021-09-07 LAB — CBC WITH DIFFERENTIAL/PLATELET
Abs Immature Granulocytes: 0.02 10*3/uL (ref 0.00–0.07)
Basophils Absolute: 0 10*3/uL (ref 0.0–0.1)
Basophils Relative: 1 %
Eosinophils Absolute: 0.4 10*3/uL (ref 0.0–0.5)
Eosinophils Relative: 9 %
HCT: 31.8 % — ABNORMAL LOW (ref 36.0–46.0)
Hemoglobin: 10.2 g/dL — ABNORMAL LOW (ref 12.0–15.0)
Immature Granulocytes: 1 %
Lymphocytes Relative: 22 %
Lymphs Abs: 0.9 10*3/uL (ref 0.7–4.0)
MCH: 34.2 pg — ABNORMAL HIGH (ref 26.0–34.0)
MCHC: 32.1 g/dL (ref 30.0–36.0)
MCV: 106.7 fL — ABNORMAL HIGH (ref 80.0–100.0)
Monocytes Absolute: 0.4 10*3/uL (ref 0.1–1.0)
Monocytes Relative: 11 %
Neutro Abs: 2.3 10*3/uL (ref 1.7–7.7)
Neutrophils Relative %: 56 %
Platelets: 163 10*3/uL (ref 150–400)
RBC: 2.98 MIL/uL — ABNORMAL LOW (ref 3.87–5.11)
RDW: 13.9 % (ref 11.5–15.5)
WBC: 4 10*3/uL (ref 4.0–10.5)
nRBC: 0 % (ref 0.0–0.2)

## 2021-09-07 LAB — BASIC METABOLIC PANEL
Anion gap: 11 (ref 5–15)
BUN: 35 mg/dL — ABNORMAL HIGH (ref 8–23)
CO2: 28 mmol/L (ref 22–32)
Calcium: 9.1 mg/dL (ref 8.9–10.3)
Chloride: 93 mmol/L — ABNORMAL LOW (ref 98–111)
Creatinine, Ser: 9.32 mg/dL — ABNORMAL HIGH (ref 0.44–1.00)
GFR, Estimated: 4 mL/min — ABNORMAL LOW (ref >60)
Glucose, Bld: 177 mg/dL — ABNORMAL HIGH (ref 70–99)
Potassium: 4.9 mmol/L (ref 3.5–5.1)
Sodium: 132 mmol/L — ABNORMAL LOW (ref 135–145)

## 2021-09-07 MED ORDER — OXYCODONE-ACETAMINOPHEN 5-325 MG PO TABS
2.0000 | ORAL_TABLET | Freq: Once | ORAL | Status: AC
  Administered 2021-09-07: 2 via ORAL
  Filled 2021-09-07: qty 2

## 2021-09-07 MED ORDER — NALOXONE HCL 4 MG/0.1ML NA LIQD
NASAL | 2 refills | Status: AC
Start: 2021-09-07 — End: ?

## 2021-09-07 MED ORDER — OXYCODONE-ACETAMINOPHEN 5-325 MG PO TABS: 2.0000 | ORAL_TABLET | Freq: Four times a day (QID) | ORAL | 0 refills | Status: DC | PRN

## 2021-09-07 NOTE — Progress Notes (Signed)
Upper extremity venous left study completed.  Preliminary results relayed to [place], MD.  See CV Proc for preliminary results report.   Darlin Coco, RDMS, RVT

## 2021-09-07 NOTE — ED Provider Triage Note (Signed)
Emergency Medicine Provider Triage Evaluation Note  Rachel Hobbs , a 81 y.o. female  was evaluated in triage.  Pt complains of left arm pain and swelling.  Patient had a procedure done by vascular surgery on 09/04/2021, chart review shows that it was ligation of left cephalic vein.  She noticed over the past few days that she is having pain and swelling to her left arm.  Despite taking pain medicine at home.  She has been completing her dialysis sessions as she is supposed to every Tuesday, Thursday and Saturday.  Denies any chest pain or shortness of breath.  Review of Systems  Positive: Left arm pain and swelling Negative: Chest pain, shortness of breath  Physical Exam  BP (!) 159/68   Pulse 77   Temp 98.6 F (37 C)   Resp 16   SpO2 100%  Gen:   Awake, no distress   Resp:  Normal effort  MSK:   Moves extremities without difficulty  Other:  Diffuse tenderness of the left arm.  Doppler noted radial pulse on left  Medical Decision Making  Medically screening exam initiated at 6:02 PM.  Appropriate orders placed.  Rachel Hobbs was informed that the remainder of the evaluation will be completed by another provider, this initial triage assessment does not replace that evaluation, and the importance of remaining in the ED until their evaluation is complete.  Labs and vascular study ordered   Delia Heady, PA-C 09/07/21 0737

## 2021-09-07 NOTE — ED Provider Notes (Signed)
[facility] EMERGENCY DEPARTMENT Provider Note  CSN: [id number] Arrival date & time: 09/07/21 1715  Chief Complaint(s) Edema (Right hand)  HPI EMILYN RUBLE is a 81 y.o. female with PMH CHF, ESRD on hemodialysis Tuesday Thursday Saturday, HTN, HLD, recent fistula ligation in the left upper extremity who presents for evaluation of left upper extremity swelling and pain.  Patient states that it is not uncommon for her to have left upper extremity swelling but she feels like it has worsened over the last few days and her home Percocet are not controlling her pain.  She is concerned that she has a blood clot in the arm.  She is currently not using any compression devices for her swelling.  She denies chest pain, shortness of breath, abdominal pain, nausea, vomiting or other systemic symptoms.   Past Medical History Past Medical History:  Diagnosis Date   Anemia of renal disease 06/16/2006   Qualifier: Diagnosis of  By: Marinell Blight, Dawn     Arthritis    CHF (congestive heart failure) ([place])    COVID 2021   Depression    Diabetes mellitus    type 2   Hernia    Hyperlipidemia    Hypertension    Iron deficiency anemia    Low iron    Nodule of soft tissue 06/22/2012   Peripheral vascular disease (HCC)    in legs   Pneumonia    teenager   Renal disorder    CKD - dialysis T/TH/Sa   Shortness of breath dyspnea    with exertion   SMALL BOWEL OBSTRUCTION, HX OF 02/15/2007   Qualifier: Diagnosis of  ByHoy Morn MD, HEIDI     Patient Active Problem List   Diagnosis Date Noted   Open-angle glaucoma of right eye, indeterminate stage 03/30/2021   Callus 03/11/2021   Anaphylactic shock, unspecified, initial encounter 03/10/2021   Acute non-recurrent sinusitis 01/30/2021   Acrochordon 03/31/2020   Head mass 11/12/2019   Nuclear sclerotic cataract of both eyes 07/23/2019   Cortical age-related cataract of both eyes 07/23/2019   Posterior vitreous detachment of left  eye 07/23/2019   Posterior vitreous detachment of right eye 07/23/2019   Moderate nonproliferative diabetic retinopathy of both eyes without macular edema associated with type 2 diabetes mellitus ([place]) 07/23/2019   Moderate protein-calorie malnutrition ([place]) 05/20/2019   COVID-19 04/22/2019   Allergy, unspecified, initial encounter 02/05/2019   Impacted cerumen of left ear 11/03/2018   Seasonal allergic rhinitis 11/03/2018   Trigger ring finger of right hand 07/10/2018   Painful diabetic neuropathy ([place]) 02/24/2018    Class: Chronic   Lichen planus 54/56/2563   Hypercalcemia 02/03/2017   Pain, unspecified 09/22/2016   Healthcare maintenance 08/23/2016   Thrombocytopenia, unspecified ([place]) 05/10/2016   Hyperglycemia, unspecified 12/17/2015   Anemia in chronic kidney disease 12/16/2015   Diarrhea, unspecified 12/16/2015   Fever, unspecified 12/16/2015   Iron deficiency anemia, unspecified 12/16/2015   Other specified coagulation defects ([place]) 12/16/2015   Pruritus, unspecified 12/16/2015   Secondary hyperparathyroidism of renal origin ([place]) 12/16/2015   Type 2 diabetes mellitus with diabetic peripheral angiopathy without gangrene ([place]) 12/16/2015   Type II diabetes mellitus with end-stage renal disease ([place])    Chronic constipation 89/37/3428   Diastolic CHF ([place]) 76/81/1572   End stage renal disease ([place]) 06/14/2011   GOUT, ACUTE 11/03/2009   FOOT PAIN, RIGHT 12/11/2008   LEG EDEMA, BILATERAL 12/03/2008   Other specified abdominal hernia without obstruction or gangrene 07/10/2007  OSTEOARTHROSIS, LOCAL NOS, OTHER SPEC SITE 12/08/2006   GLAUCOMA 08/17/2006   HAIR LOSS 08/17/2006   DM type 2, controlled, with complication ([place]) 16/01/9603   HLD (hyperlipidemia) 06/16/2006   Morbid obesity ([place]) 06/16/2006   Anemia of renal disease 06/16/2006    Class: Chronic   HYPERTENSION, BENIGN SYSTEMIC 06/16/2006   EDEMA-LEGS,DUE TO VENOUS OBSTRUCT. 06/16/2006   Home  Medication(s) Prior to Admission medications   Medication Sig Start Date End Date Taking? Authorizing Provider  naloxone [facility]) nasal spray 4 mg/0.1 mL Use in both nostrils if signs of opioid OD 09/07/21  Yes Demetria Lightsey, MD  oxyCODONE-acetaminophen (PERCOCET/ROXICET) 5-325 MG tablet Take 2 tablets by mouth every 6 (six) hours as needed for severe pain. 09/07/21  Yes Torris House, MD  acetaminophen (TYLENOL) 500 MG tablet Take 500 mg by mouth every 6 (six) hours as needed for mild pain or headache.    [provider]  aspirin 81 MG tablet Take 81 mg by mouth every other day.    [provider]  atorvastatin (LIPITOR) 40 MG tablet TAKE 1 TABLET(40 MG) BY MOUTH DAILY AT 6 PM Strength: 40 mg 05/28/21   Lurline Del, DO  bisacodyl (DULCOLAX) 5 MG EC tablet Take 5 mg by mouth daily as needed for moderate constipation.    [provider]  Blood Glucose Monitoring Suppl (BLOOD GLUCOSE METER) kit Use as instructed 05/09/13   Olam Idler, MD  Blood Glucose Monitoring Suppl [facility] VERIO) w/Device KIT Use daily as indicated 02/24/18   Diallo, Earna Coder, MD  calcium acetate (PHOSLO) 667 MG capsule Take 667-1,334 mg by mouth See admin instructions. Take 1334 mg with each meal and 667 mg with each snack 05/10/16   [provider]  gabapentin (NEURONTIN) 100 MG capsule Take 1 capsule (100 mg total) by mouth 3 (three) times daily. Take 100 mg in morning, and 200 mg at night Patient not taking: Reported on 06/12/2021 03/31/20   Delora Fuel, MD  glucose blood (ONETOUCH VERIO) test strip Use as instructed 02/24/18   Diallo, Earna Coder, MD  Insulin Pen Needle 31G X 8 MM MISC BD UltraFine III Pen Needles. For use with insulin pen device. Inject insulin 2X daily 05/05/18   Zenia Resides, MD  isosorbide mononitrate (IMDUR) 30 MG 24 hr tablet Take 30 mg by mouth daily. 03/29/21   [provider]  Lancets Glory Rosebush ULTRASOFT) lancets Once daily testing plus prn for  hypoglycemia 05/09/13   Olam Idler, MD  LANTUS SOLOSTAR 100 UNIT/ML Solostar Pen Inject 50 Units into the skin at bedtime. Patient taking differently: Inject 40 Units into the skin every evening. 11/03/20   Autry-Lott, Naaman Plummer, DO  NEEDLE, DISP, 30 G (B-D DISP NEEDLE 30GX1") 30G X 1" MISC 1 each by Does not apply route daily. 05/03/14   Olam Idler, MD  ONE TOUCH ULTRA TEST test strip TEST BLOOD SUGAR EVERY DAY AND AS NEEDED FOR SYMPTOMS OF HYPOGLYCEMIA 08/12/16   Vivi Barrack, MD  [facility] DELICA LANCETS 54U MISC Use daily as indicated 02/24/18   Diallo, Earna Coder, MD  oxyCODONE-acetaminophen (PERCOCET) 5-325 MG tablet Take 1 tablet by mouth every 6 (six) hours as needed for severe pain. 09/04/21 09/04/22  Baglia, Corrina, PA-C  pregabalin (LYRICA) 75 MG capsule Take 1 capsule (75 mg total) by mouth 2 (two) times daily. Patient not taking: Reported on 06/12/2021 12/10/20   Felipa Furnace, DPM  Past Surgical History Past Surgical History:  Procedure Laterality Date   ABDOMINAL HYSTERECTOMY     in the 55's   AV FISTULA PLACEMENT Left 12/05/2015   Procedure: RADIOCEPHALIC VERSUS BRACHIOCEPHALIC ARTERIOVENOUS (AV) FISTULA CREATION;  Surgeon: [name], MD;  Location: [place];  Service: Vascular;  Laterality: Left;   AV FISTULA PLACEMENT Left 05/20/2017   Procedure: INSERTION OF ARTERIOVENOUS (AV) GORE-TEX VASCULAR STRETCH 4-7 GRAFT ARM LEFT UPPER ARM;  Surgeon: [name], MD;  Location: [place];  Service: Vascular;  Laterality: Left;   AV FISTULA PLACEMENT Left 08/14/2021   Procedure: LEFT ARM ARTERIOVENOUS (AV) FISTULA CREATION;  Surgeon: [name], MD;  Location: [place];  Service: Vascular;  Laterality: Left;   [place] Left 06/04/2016   Procedure: FIRST STAGE BASILIC VEIN TRANSPOSITION;  Surgeon: [name], MD;  Location:  [place];  Service: Vascular;  Laterality: Left;   [place] Left 08/04/2016   Procedure: LEFT UPPER ARM [place], SECOND STAGE;  Surgeon: [name], MD;  Location: [place];  Service: Vascular;  Laterality: Left;   HERNIA REPAIR     umbilical in the 38'V   INSERTION OF DIALYSIS CATHETER Right 12/05/2015   Procedure: INSERTION OF DIALYSIS CATHETER;  Surgeon: [name], MD;  Location: [place];  Service: Vascular;  Laterality: Right;   IR THROMBECTOMY AV FISTULA W/THROMBOLYSIS/PTA INC/SHUNT/IMG LEFT Left 05/09/2019   IR US GUIDE VASC ACCESS LEFT  05/09/2019   LIGATION OF COMPETING BRANCHES OF ARTERIOVENOUS FISTULA Left 09/04/2021   Procedure: LIGATION OF LEFT ARM CEPHALIC VEIN;  Surgeon: [name], MD;  Location: [place];  Service: Vascular;  Laterality: Left;   UPPER EXTREMITY VENOGRAPHY Bilateral 06/15/2021   Procedure: UPPER EXTREMITY VENOGRAPHY;  Surgeon: [name], MD;  Location: [place] [facility];  Service: Cardiovascular;  Laterality: Bilateral;   Family History Family History  Problem Relation Age of Onset   Kidney disease Mother    Hypertension Mother    COPD Father        smoke   Cancer Father        Lung   Hypertension Father    Kidney disease Sister    Diabetes Daughter     Social History Social History   Tobacco Use   Smoking status: Never    Passive exposure: Never   Smokeless tobacco: Never  Vaping Use   Vaping Use: Never used  Substance Use Topics   Alcohol use: No   Drug use: No   Allergies Patient has no known allergies.  Review of Systems Review of Systems  Musculoskeletal:        Arm swelling   Physical Exam Vital Signs  I have reviewed the triage vital signs BP (!) 197/82   Pulse 80   Temp 98.8 F (37.1 C) (Oral)   Resp 18   SpO2 100%   Physical Exam Vitals and nursing note reviewed.  Constitutional:      General: She is not in acute distress.    Appearance: She is  well-developed.  HENT:     Head: Normocephalic and atraumatic.  Eyes:     Conjunctiva/sclera: Conjunctivae normal.  Cardiovascular:     Rate and Rhythm: Normal rate and regular rhythm.     Heart sounds: No murmur heard. Pulmonary:     Effort: Pulmonary effort is normal. No respiratory distress.     Breath sounds: Normal breath sounds.  Abdominal:     Palpations:  Abdomen is soft.     Tenderness: There is no abdominal tenderness.  Musculoskeletal:        General: Swelling and tenderness present.     Cervical back: Neck supple.  Skin:    General: Skin is warm and dry.     Capillary Refill: Capillary refill takes less than 2 seconds.  Neurological:     Mental Status: She is alert.  Psychiatric:        Mood and Affect: Mood normal.    ED Results and Treatments Labs (all labs ordered are listed, but only abnormal results are displayed) Labs Reviewed  BASIC METABOLIC PANEL - Abnormal; Notable for the following components:      Result Value   Sodium 132 (*)    Chloride 93 (*)    Glucose, Bld 177 (*)    BUN 35 (*)    Creatinine, Ser 9.32 (*)    GFR, Estimated 4 (*)    All other components within normal limits  CBC WITH DIFFERENTIAL/PLATELET - Abnormal; Notable for the following components:   RBC 2.98 (*)    Hemoglobin 10.2 (*)    HCT 31.8 (*)    MCV 106.7 (*)    MCH 34.2 (*)    All other components within normal limits                                                                                                                          Radiology UE Venous Duplex ([facility] and [facility] ONLY)  Result Date: 09/07/2021 UPPER VENOUS STUDY  Patient Name:  MALGORZATA ALBERT  Date of Exam:   09/07/2021 Medical Rec #: [id number]      Accession #:    [id number] Date of Birth: 09-Mar-1941       Patient Gender: F Patient Age:   35 years Exam Location:  [facility] Procedure:      VAS [place] UPPER EXTREMITY VENOUS DUPLEX Referring Phys: HINA KHATRI  --------------------------------------------------------------------------------  Indications: Pain and swelling LT arm, s/p cephalic vein ligation Risk Factors: Surgery 08-14-2021 Left brachiocephalic fistula creation. 09-04-2021 Left arm AV fistula ligation. History of left AVG. Comparison Study: 06-15-2021 Upper extremity venography Performing Technologist: Darlin Coco RDMS, RVT  Examination Guidelines: A complete evaluation includes B-mode imaging, spectral Doppler, color Doppler, and power Doppler as needed of all accessible portions of each vessel. Bilateral testing is considered an integral part of a complete examination. Limited examinations for reoccurring indications may be performed as noted.  Right Findings: +----------+------------+---------+-----------+----------+-------+ RIGHT     CompressiblePhasicitySpontaneousPropertiesSummary +----------+------------+---------+-----------+----------+-------+ Subclavian               Yes       Yes                      +----------+------------+---------+-----------+----------+-------+  Left Findings: +----------+------------+---------+-----------+----------+---------------------+ LEFT      CompressiblePhasicitySpontaneousProperties       Summary        +----------+------------+---------+-----------+----------+---------------------+ IJV  Full       Yes       Yes                                    +----------+------------+---------+-----------+----------+---------------------+ Subclavian               Yes       Yes                                    +----------+------------+---------+-----------+----------+---------------------+ Axillary      Full       Yes       Yes                                    +----------+------------+---------+-----------+----------+---------------------+ Brachial      Full                                                         +----------+------------+---------+-----------+----------+---------------------+ Radial        Full                                                        +----------+------------+---------+-----------+----------+---------------------+ Ulnar         Full                                                        +----------+------------+---------+-----------+----------+---------------------+ Cephalic      Full                                  Fully compressible at                                                      upper arm, not well                                                      visualized beyond the                                                        level of the [facility]    +----------+------------+---------+-----------+----------+---------------------+ Basilic  Not visualized     +----------+------------+---------+-----------+----------+---------------------+  Summary:  Right: No evidence of thrombosis in the subclavian.  Left: No evidence of deep vein thrombosis in the upper extremity. However, unable to visualize the basilic vein.  *See table(s) above for measurements and observations.     Preliminary     Pertinent labs & imaging results that were available during my care of the patient were reviewed by me and considered in my medical decision making (see MDM for details).  Medications Ordered in ED Medications  oxyCODONE-acetaminophen (PERCOCET/ROXICET) 5-325 MG per tablet 2 tablet (2 tablets Oral Given 09/07/21 1843)                                                                                                                                     Procedures Procedures  (including critical care time)  Medical Decision Making / ED Course   This patient presents to the ED for concern of arm swelling and pain, this involves an extensive number of treatment options, and is a complaint that carries with  it a high risk of complications and morbidity.  The differential diagnosis includes dependent edema, DVT, lymphedema, fistula complication  MDM: Patient seen in the emergency department for evaluation of left upper extremity swelling.  Physical exam with asymmetric left upper extremity swelling with mild tenderness to palpation over the entire extremity.  Pulses intact.  Initial laboratory evaluation with a hemoglobin of 10.2, no significant leukocytosis, BUN 35, creatinine 9.32.  I consulted vascular surgery to ensure that I am obtaining the appropriate ultrasound method and they are recommending standard upper extremity DVT ultrasound and if negative, discharged with compression and elevation.  This DVT study was reassuringly negative for DVT and an Ace wrap was applied.  The patient's pain regimen was increased for short period and she will follow-up outpatient.  Patient to discharge.  Narcan also provided to family in the setting of increasing opioid doses.   Additional history obtained: -Additional history obtained from daughter -External records from outside source obtained and reviewed including: Chart review including previous notes, labs, imaging, consultation notes   Lab Tests: -I ordered, reviewed, and interpreted labs.   The pertinent results include:   Labs Reviewed  BASIC METABOLIC PANEL - Abnormal; Notable for the following components:      Result Value   Sodium 132 (*)    Chloride 93 (*)    Glucose, Bld 177 (*)    BUN 35 (*)    Creatinine, Ser 9.32 (*)    GFR, Estimated 4 (*)    All other components within normal limits  CBC WITH DIFFERENTIAL/PLATELET - Abnormal; Notable for the following components:   RBC 2.98 (*)    Hemoglobin 10.2 (*)    HCT 31.8 (*)    MCV 106.7 (*)    MCH 34.2 (*)    All other components within normal limits       Imaging  Studies ordered: I ordered imaging studies including DVT ultrasound I independently visualized and interpreted imaging. I  agree with the radiologist interpretation   Medicines ordered and prescription drug management: Meds ordered this encounter  Medications   oxyCODONE-acetaminophen (PERCOCET/ROXICET) 5-325 MG per tablet 2 tablet   oxyCODONE-acetaminophen (PERCOCET/ROXICET) 5-325 MG tablet    Sig: Take 2 tablets by mouth every 6 (six) hours as needed for severe pain.    Dispense:  16 tablet    Refill:  0   naloxone (NARCAN) nasal spray 4 mg/0.1 mL    Sig: Use in both nostrils if signs of opioid OD    Dispense:  1 each    Refill:  2    -I have reviewed the patients home medicines and have made adjustments as needed  Critical interventions none  Consultations Obtained: I requested consultation with the vascular surgery,  and discussed lab and imaging findings as well as pertinent plan - they recommend: Upper extremity DVT ultrasound, compression and discharge   Cardiac Monitoring: The patient was maintained on a cardiac monitor.  I personally viewed and interpreted the cardiac monitored which showed an underlying rhythm of: NSR  Social Determinants of Health:  Factors impacting patients care include: none   Reevaluation: After the interventions noted above, I reevaluated the patient and found that they have :improved  Co morbidities that complicate the patient evaluation  Past Medical History:  Diagnosis Date   Anemia of renal disease 06/16/2006   Qualifier: Diagnosis of  By: Marinell Blight, Dawn     Arthritis    CHF (congestive heart failure) ([place])    COVID 2021   Depression    Diabetes mellitus    type 2   Hernia    Hyperlipidemia    Hypertension    Iron deficiency anemia    Low iron    Nodule of soft tissue 06/22/2012   Peripheral vascular disease ([place])    in legs   Pneumonia    teenager   Renal disorder    CKD - dialysis T/TH/Sa   Shortness of breath dyspnea    with exertion   SMALL BOWEL OBSTRUCTION, HX OF 02/15/2007   Qualifier: Diagnosis of  By: [name], HEIDI         Dispostion: I considered admission for this patient, but given negative work-up, patient safe for discharge with outpatient follow-up     Final Clinical Impression(s) / ED Diagnoses Final diagnoses:  Dependent edema     _0 @    Teressa Lower, MD 09/07/21 [phone]

## 2021-09-07 NOTE — Anesthesia Postprocedure Evaluation (Signed)
Anesthesia Post Note  Patient: Rachel Hobbs  Procedure(s) Performed: LIGATION OF LEFT ARM CEPHALIC VEIN (Left: Arm Lower)     Patient location during evaluation: PACU Anesthesia Type: MAC Level of consciousness: awake and alert Pain management: pain level controlled Vital Signs Assessment: post-procedure vital signs reviewed and stable Respiratory status: spontaneous breathing, nonlabored ventilation, respiratory function stable and patient connected to nasal cannula oxygen Cardiovascular status: stable and blood pressure returned to baseline Postop Assessment: no apparent nausea or vomiting Anesthetic complications: no   No notable events documented.  Last Vitals:  Vitals:   09/04/21 1600 09/04/21 1615  BP: (!) 189/90 (!) 158/77  Pulse: 83 83  Resp: 12 14  Temp:  (!) 36.4 C  SpO2: 94% 96%    Last Pain:  Vitals:   09/04/21 1557  PainSc: 5                  [name]

## 2021-09-07 NOTE — ED Triage Notes (Signed)
Patient coming from home, complaint of pain and swelling of the left hand. Per pt had procedure to restore blood flow in the hand, per pt swelling has been there since the procedure but the pain is new.

## 2021-09-07 NOTE — ED Notes (Signed)
Discharge instructions reviewed with patient and family member . Patient and family member verbalized understanding of instructions. Follow-up care and medications were reviewed. Patient used wheelchair to leave ED. VSS upon discharge.

## 2021-09-12 ENCOUNTER — Emergency Department (HOSPITAL_COMMUNITY): Payer: Medicare Other

## 2021-09-12 ENCOUNTER — Other Ambulatory Visit: Payer: Self-pay

## 2021-09-12 ENCOUNTER — Emergency Department (HOSPITAL_COMMUNITY)
Admission: EM | Admit: 2021-09-12 | Discharge: 2021-09-12 | Disposition: A | Discharge status: Home / Self Care | Payer: Medicare Other | Attending: Emergency Medicine | Admitting: Emergency Medicine

## 2021-09-12 ENCOUNTER — Encounter (HOSPITAL_COMMUNITY): Payer: Self-pay | Admitting: Emergency Medicine

## 2021-09-12 DIAGNOSIS — Z794 Long term (current) use of insulin: Secondary | ICD-10-CM | POA: Diagnosis not present

## 2021-09-12 DIAGNOSIS — Z7982 Long term (current) use of aspirin: Secondary | ICD-10-CM | POA: Diagnosis not present

## 2021-09-12 DIAGNOSIS — N186 End stage renal disease: Secondary | ICD-10-CM | POA: Diagnosis not present

## 2021-09-12 DIAGNOSIS — R600 Localized edema: Secondary | ICD-10-CM | POA: Diagnosis not present

## 2021-09-12 DIAGNOSIS — R059 Cough, unspecified: Secondary | ICD-10-CM | POA: Insufficient documentation

## 2021-09-12 DIAGNOSIS — Z992 Dependence on renal dialysis: Secondary | ICD-10-CM | POA: Insufficient documentation

## 2021-09-12 DIAGNOSIS — I509 Heart failure, unspecified: Secondary | ICD-10-CM | POA: Diagnosis not present

## 2021-09-12 DIAGNOSIS — R0602 Shortness of breath: Secondary | ICD-10-CM | POA: Diagnosis present

## 2021-09-12 LAB — CBC WITH DIFFERENTIAL/PLATELET
Abs Immature Granulocytes: 0.07 10*3/uL (ref 0.00–0.07)
Basophils Absolute: 0.1 10*3/uL (ref 0.0–0.1)
Basophils Relative: 1 %
Eosinophils Absolute: 0.4 10*3/uL (ref 0.0–0.5)
Eosinophils Relative: 7 %
HCT: 33 % — ABNORMAL LOW (ref 36.0–46.0)
Hemoglobin: 10.6 g/dL — ABNORMAL LOW (ref 12.0–15.0)
Immature Granulocytes: 1 %
Lymphocytes Relative: 17 %
Lymphs Abs: 0.9 10*3/uL (ref 0.7–4.0)
MCH: 34.1 pg — ABNORMAL HIGH (ref 26.0–34.0)
MCHC: 32.1 g/dL (ref 30.0–36.0)
MCV: 106.1 fL — ABNORMAL HIGH (ref 80.0–100.0)
Monocytes Absolute: 0.5 10*3/uL (ref 0.1–1.0)
Monocytes Relative: 9 %
Neutro Abs: 3.7 10*3/uL (ref 1.7–7.7)
Neutrophils Relative %: 65 %
Platelets: 131 10*3/uL — ABNORMAL LOW (ref 150–400)
RBC: 3.11 MIL/uL — ABNORMAL LOW (ref 3.87–5.11)
RDW: 14.2 % (ref 11.5–15.5)
WBC: 5.7 10*3/uL (ref 4.0–10.5)
nRBC: 0 % (ref 0.0–0.2)

## 2021-09-12 LAB — BASIC METABOLIC PANEL
Anion gap: 10 (ref 5–15)
BUN: 13 mg/dL (ref 8–23)
CO2: 30 mmol/L (ref 22–32)
Calcium: 8.7 mg/dL — ABNORMAL LOW (ref 8.9–10.3)
Chloride: 95 mmol/L — ABNORMAL LOW (ref 98–111)
Creatinine, Ser: 4.83 mg/dL — ABNORMAL HIGH (ref 0.44–1.00)
GFR, Estimated: 9 mL/min — ABNORMAL LOW (ref >60)
Glucose, Bld: 144 mg/dL — ABNORMAL HIGH (ref 70–99)
Potassium: 3.6 mmol/L (ref 3.5–5.1)
Sodium: 135 mmol/L (ref 135–145)

## 2021-09-12 LAB — TROPONIN I (HIGH SENSITIVITY)
Troponin I (High Sensitivity): 24 ng/L — ABNORMAL HIGH (ref <18)
Troponin I (High Sensitivity): 25 ng/L — ABNORMAL HIGH (ref <18)

## 2021-09-12 LAB — BRAIN NATRIURETIC PEPTIDE: B Natriuretic Peptide: 69.6 pg/mL (ref 0.0–100.0)

## 2021-09-12 NOTE — Discharge Instructions (Signed)
You were seen in the emergency department for evaluation of shortness of breath after dialysis.  You had blood work EKG and a chest x-ray that did not show any explanation for your symptoms.  You were feeling better and comfortable with plan for discharge.  Please follow-up with your primary care doctor and your nephrologist.  Return to the emergency department if any worsening or concerning symptoms

## 2021-09-12 NOTE — ED Notes (Signed)
Pt refused covid swab. °

## 2021-09-12 NOTE — ED Triage Notes (Signed)
Pt BIB [facility] from home, pt went to HD today, normal HD day, became SOB after getting back home from treatment. Pt received full treatment. Pt also having fatigue but says that is baseline. BP 447 systolic.

## 2021-09-12 NOTE — ED Provider Notes (Signed)
[facility] EMERGENCY DEPARTMENT Provider Note   CSN: [id number] Arrival date & time: 09/12/21  1651     History {Add pertinent medical, surgical, social history, OB history to HPI:1} Chief Complaint  Patient presents with   Shortness of Breath    Rachel Hobbs is a 81 y.o. female.  She has a history of diabetes CHF end-stage renal disease gets dialysis Tuesday Thursday Saturday.  She had a full run of dialysis today and she said when she got home she was feeling short of breath.  She denies any chest pain.  She has had a cough its been nonproductive.  No fevers or chills.  No abdominal pain vomiting or diarrhea.  She recently had her fistula in her left arm ligated and that is caused some swelling in her arm.  She was here recently and they did a duplex that was negative for DVT and she has been using an Ace wrap with minimal improvement.  She also says she has had an abdominal hernia that she needs to see somebody for.   The history is provided by the patient.  Shortness of Breath Severity:  Moderate Onset quality:  Gradual Duration:  2 hours Timing:  Constant Progression:  Improving Chronicity:  New Relieved by:  None tried Worsened by:  Nothing Ineffective treatments:  None tried Associated symptoms: cough   Associated symptoms: no abdominal pain, no chest pain, no fever, no headaches, no hemoptysis, no sore throat, no sputum production and no vomiting   Risk factors: no tobacco use       Home Medications Prior to Admission medications   Medication Sig Start Date End Date Taking? Authorizing Provider  acetaminophen (TYLENOL) 500 MG tablet Take 500 mg by mouth every 6 (six) hours as needed for mild pain or headache.    [provider]  aspirin 81 MG tablet Take 81 mg by mouth every other day.    [provider]  atorvastatin (LIPITOR) 40 MG tablet TAKE 1 TABLET(40 MG) BY MOUTH DAILY AT 6 PM Strength: 40 mg 05/28/21   Lurline Del, DO   bisacodyl (DULCOLAX) 5 MG EC tablet Take 5 mg by mouth daily as needed for moderate constipation.    [provider]  Blood Glucose Monitoring Suppl (BLOOD GLUCOSE METER) kit Use as instructed 05/09/13   Olam Idler, MD  Blood Glucose Monitoring Suppl [facility] VERIO) w/Device KIT Use daily as indicated 02/24/18   Diallo, Earna Coder, MD  calcium acetate (PHOSLO) 667 MG capsule Take 667-1,334 mg by mouth See admin instructions. Take 1334 mg with each meal and 667 mg with each snack 05/10/16   [provider]  gabapentin (NEURONTIN) 100 MG capsule Take 1 capsule (100 mg total) by mouth 3 (three) times daily. Take 100 mg in morning, and 200 mg at night Patient not taking: Reported on 06/12/2021 03/31/20   Delora Fuel, MD  glucose blood (ONETOUCH VERIO) test strip Use as instructed 02/24/18   Diallo, Earna Coder, MD  Insulin Pen Needle 31G X 8 MM MISC BD UltraFine III Pen Needles. For use with insulin pen device. Inject insulin 2X daily 05/05/18   Zenia Resides, MD  isosorbide mononitrate (IMDUR) 30 MG 24 hr tablet Take 30 mg by mouth daily. 03/29/21   [provider]  Lancets Glory Rosebush ULTRASOFT) lancets Once daily testing plus prn for hypoglycemia 05/09/13   Olam Idler, MD  LANTUS SOLOSTAR 100 UNIT/ML Solostar Pen Inject 50 Units into the skin at bedtime. Patient  taking differently: Inject 40 Units into the skin every evening. 11/03/20   Autry-Lott, Naaman Plummer, DO  naloxone [facility]) nasal spray 4 mg/0.1 mL Use in both nostrils if signs of opioid OD 09/07/21   Kommor, Madison, MD  NEEDLE, DISP, 30 G (B-D DISP NEEDLE 30GX1") 30G X 1" MISC 1 each by Does not apply route daily. 05/03/14   Olam Idler, MD  ONE TOUCH ULTRA TEST test strip TEST BLOOD SUGAR EVERY DAY AND AS NEEDED FOR SYMPTOMS OF HYPOGLYCEMIA 08/12/16   Vivi Barrack, MD  [facility] DELICA LANCETS 09T MISC Use daily as indicated 02/24/18   Diallo, Earna Coder, MD  oxyCODONE-acetaminophen (PERCOCET) 5-325 MG tablet  Take 1 tablet by mouth every 6 (six) hours as needed for severe pain. 09/04/21 09/04/22  Baglia, Corrina, PA-C  oxyCODONE-acetaminophen (PERCOCET/ROXICET) 5-325 MG tablet Take 2 tablets by mouth every 6 (six) hours as needed for severe pain. 09/07/21   Kommor, Madison, MD  pregabalin (LYRICA) 75 MG capsule Take 1 capsule (75 mg total) by mouth 2 (two) times daily. Patient not taking: Reported on 06/12/2021 12/10/20   Felipa Furnace, DPM      Allergies    Patient has no known allergies.    Review of Systems   Review of Systems  Constitutional:  Negative for fever.  HENT:  Positive for rhinorrhea. Negative for sore throat.   Respiratory:  Positive for cough and shortness of breath. Negative for hemoptysis and sputum production.   Cardiovascular:  Negative for chest pain.  Gastrointestinal:  Negative for abdominal pain and vomiting.  Neurological:  Negative for headaches.   Physical Exam Updated Vital Signs BP (!) 162/64 (BP Location: Right Arm)   Pulse 91   Temp 98 F (36.7 C) (Oral)   Resp 20   Ht 5' 2"  (1.575 m)   Wt 103 kg   SpO2 100%   BMI 41.52 kg/m  Physical Exam Vitals and nursing note reviewed.  Constitutional:      General: She is not in acute distress.    Appearance: She is well-developed.  HENT:     Head: Normocephalic and atraumatic.  Eyes:     Conjunctiva/sclera: Conjunctivae normal.  Cardiovascular:     Rate and Rhythm: Normal rate and regular rhythm.     Heart sounds: No murmur heard. Pulmonary:     Effort: Pulmonary effort is normal. No respiratory distress.     Breath sounds: Normal breath sounds.  Abdominal:     Palpations: Abdomen is soft.     Tenderness: There is no abdominal tenderness. There is no guarding.  Musculoskeletal:        General: No swelling.     Cervical back: Neck supple.     Right lower leg: No tenderness. Edema present.     Left lower leg: No tenderness. Edema present.  Skin:    General: Skin is warm and dry.     Capillary Refill:  Capillary refill takes less than 2 seconds.  Neurological:     General: No focal deficit present.     Mental Status: She is alert.    ED Results / Procedures / Treatments   Labs (all labs ordered are listed, but only abnormal results are displayed) Labs Reviewed  SARS CORONAVIRUS 2 BY RT PCR  BASIC METABOLIC PANEL  CBC WITH DIFFERENTIAL/PLATELET  BRAIN NATRIURETIC PEPTIDE  TROPONIN I (HIGH SENSITIVITY)    EKG EKG Interpretation  Date/Time:  Saturday Sep 12 2021 17:00:58 EDT Ventricular Rate:  88 PR Interval:  198 QRS Duration: 82 QT Interval:  409 QTC Calculation: 495 R Axis:   -18 Text Interpretation: Sinus rhythm Borderline left axis deviation Low voltage, precordial leads Nonspecific T abnormalities, lateral leads Borderline prolonged QT interval No significant change since prior 2/23 Confirmed by Aletta Edouard [phone]) on 09/12/2021 5:08:03 PM  Radiology No results found.  Procedures Procedures  {Document cardiac monitor, telemetry assessment procedure when appropriate:1}  Medications Ordered in ED Medications - No data to display  ED Course/ Medical Decision Making/ A&P Clinical Course as of 09/12/21 1715  Sat Sep 12, 2021  1708 Last cardiac echo 8/22 showed EF of 65% with grade 1 diastolic dysfunction. [MB]    Clinical Course User Index [MB] Hayden Rasmussen, MD                           Medical Decision Making Amount and/or Complexity of Data Reviewed Labs: ordered. Radiology: ordered.  This patient complains of ***; this involves an extensive number of treatment Options and is a complaint that carries with it a high risk of complications and morbidity. The differential includes ***  I ordered, reviewed and interpreted labs, which included *** I ordered medication *** and reviewed PMP when indicated. I ordered imaging studies which included *** and I independently    visualized and interpreted imaging which showed *** Additional history obtained  from *** Previous records obtained and reviewed *** I consulted *** and discussed lab and imaging findings and discussed disposition.  Cardiac monitoring reviewed, *** Social determinants considered, *** Critical Interventions: ***  After the interventions stated above, I reevaluated the patient and found *** Admission and further testing considered, ***    {Document critical care time when appropriate:1} {Document review of labs and clinical decision tools ie heart score, Chads2Vasc2 etc:1}  {Document your independent review of radiology images, and any outside records:1} {Document your discussion with family members, caretakers, and with consultants:1} {Document social determinants of health affecting pt's care:1} {Document your decision making why or why not admission, treatments were needed:1} Final Clinical Impression(s) / ED Diagnoses Final diagnoses:  None    Rx / DC Orders ED Discharge Orders     None

## 2021-09-12 NOTE — ED Notes (Signed)
Pt discharged by this RN via wheelchair with family member at bedside. Pt AOX4. Respirations even and unlabored with no distress noted. Discharge instructions reviewed and pt/family member verbalized understanding.

## 2021-09-18 ENCOUNTER — Telehealth: Payer: Self-pay | Admitting: *Deleted

## 2021-09-18 NOTE — Telephone Encounter (Signed)
Patient called and left a message on Triage Line stating she is having numbness in her fingers. Called patient back got voice mail. Advised patient to call our office and let her know our office hours. Also advised if this was an emergency to go to the ER.

## 2021-09-21 ENCOUNTER — Ambulatory Visit: Payer: Medicare Other | Admitting: Podiatry

## 2021-09-22 ENCOUNTER — Other Ambulatory Visit (HOSPITAL_COMMUNITY): Payer: Self-pay | Admitting: Vascular Surgery

## 2021-09-22 DIAGNOSIS — N186 End stage renal disease: Secondary | ICD-10-CM

## 2021-09-23 ENCOUNTER — Ambulatory Visit (INDEPENDENT_AMBULATORY_CARE_PROVIDER_SITE_OTHER)
Admission: RE | Admit: 2021-09-23 | Discharge: 2021-09-23 | Disposition: A | Discharge status: Home / Self Care | Payer: Medicare Other | Source: Ambulatory Visit | Attending: Vascular Surgery | Admitting: Vascular Surgery

## 2021-09-23 ENCOUNTER — Ambulatory Visit (INDEPENDENT_AMBULATORY_CARE_PROVIDER_SITE_OTHER): Payer: Medicare Other | Admitting: Physician Assistant

## 2021-09-23 ENCOUNTER — Ambulatory Visit (HOSPITAL_COMMUNITY)
Admission: RE | Admit: 2021-09-23 | Discharge: 2021-09-23 | Disposition: A | Discharge status: Home / Self Care | Payer: Medicare Other | Source: Ambulatory Visit | Attending: Vascular Surgery | Admitting: Vascular Surgery

## 2021-09-23 VITALS — BP 166/76 | HR 78 | Temp 97.1°F | Resp 18 | Ht 62.0 in | Wt 216.1 lb

## 2021-09-23 DIAGNOSIS — N186 End stage renal disease: Secondary | ICD-10-CM | POA: Insufficient documentation

## 2021-09-24 ENCOUNTER — Encounter: Payer: Self-pay | Admitting: Physician Assistant

## 2021-09-24 NOTE — Progress Notes (Signed)
POST OPERATIVE OFFICE NOTE    CC:  F/u for surgery  HPI:  This is a 81 y.o. female who is s/p ligation left cephalic vein AV fistula due to sever steal symptoms.  She has had multiple accesses in the left UE.  She states her hand is still stiff and painful with moderate edema.  She has a working right [place].         No Known Allergies  Current Outpatient Medications  Medication Sig Dispense Refill   acetaminophen (TYLENOL) 500 MG tablet Take 500 mg by mouth every 6 (six) hours as needed for mild pain or headache.     aspirin 81 MG tablet Take 81 mg by mouth every other day.     atorvastatin (LIPITOR) 40 MG tablet TAKE 1 TABLET(40 MG) BY MOUTH DAILY AT 6 PM Strength: 40 mg 90 tablet 3   bisacodyl (DULCOLAX) 5 MG EC tablet Take 5 mg by mouth daily as needed for moderate constipation.     Blood Glucose Monitoring Suppl (BLOOD GLUCOSE METER) kit Use as instructed 1 each 0   Blood Glucose Monitoring Suppl (ONETOUCH VERIO) w/Device KIT Use daily as indicated 1 kit 0   calcium acetate (PHOSLO) 667 MG capsule Take 667-1,334 mg by mouth See admin instructions. Take 1334 mg with each meal and 667 mg with each snack     glucose blood (ONETOUCH VERIO) test strip Use as instructed 100 each 12   Insulin Pen Needle 31G X 8 MM MISC BD UltraFine III Pen Needles. For use with insulin pen device. Inject insulin 2X daily 100 each 3   isosorbide mononitrate (IMDUR) 30 MG 24 hr tablet Take 30 mg by mouth daily.     Lancets (ONETOUCH ULTRASOFT) lancets Once daily testing plus prn for hypoglycemia 100 each 9   LANTUS SOLOSTAR 100 UNIT/ML Solostar Pen Inject 50 Units into the skin at bedtime. (Patient taking differently: Inject 40 Units into the skin every evening.) 15 mL 3   naloxone (NARCAN) nasal spray 4 mg/0.1 mL Use in both nostrils if signs of opioid OD 1 each 2   NEEDLE, DISP, 30 G (B-D DISP NEEDLE 30GX1") 30G X 1" MISC 1 each by Does not apply route daily. 100 each 2   ONE TOUCH ULTRA TEST test strip  TEST BLOOD SUGAR EVERY DAY AND AS NEEDED FOR SYMPTOMS OF HYPOGLYCEMIA 100 each 0   ONETOUCH DELICA LANCETS 54O MISC Use daily as indicated 100 each 0   oxyCODONE-acetaminophen (PERCOCET) 5-325 MG tablet Take 1 tablet by mouth every 6 (six) hours as needed for severe pain. 10 tablet 0   oxyCODONE-acetaminophen (PERCOCET/ROXICET) 5-325 MG tablet Take 2 tablets by mouth every 6 (six) hours as needed for severe pain. 16 tablet 0   gabapentin (NEURONTIN) 100 MG capsule Take 1 capsule (100 mg total) by mouth 3 (three) times daily. Take 100 mg in morning, and 200 mg at night (Patient not taking: Reported on 06/12/2021) 90 capsule 3   pregabalin (LYRICA) 75 MG capsule Take 1 capsule (75 mg total) by mouth 2 (two) times daily. (Patient not taking: Reported on 06/12/2021) 30 capsule 0   Current Facility-Administered Medications  Medication Dose Route Frequency Provider Last Rate Last Admin   0.9 %  sodium chloride infusion  250 mL Intravenous PRN [name], MD       oxymetazoline (AFRIN) 0.05 % nasal spray 1 spray  1 spray Each Nare Once Dameron, Marisa, DO       sodium chloride  flush (NS) 0.9 % injection 3 mL  3 mL Intravenous Q12H [name], MD       sodium chloride flush (NS) 0.9 % injection 3 mL  3 mL Intravenous PRN [name], MD         ROS:  See HPI  Physical Exam:    Incision:  well healed Extremities:  left hand edema moderate with minimal, but intact motor.  She has good signals via doppler radial, ulnar and palmer.  Sensation intact but decreased compared to the right UE.     +-----------------+-------------+----------+---------+  Right Cephalic   Diameter (cm)Depth (cm)Findings   +-----------------+-------------+----------+---------+  Shoulder             0.24        0.43   tortuous   +-----------------+-------------+----------+---------+  Prox upper arm       0.31        0.53               +-----------------+-------------+----------+---------+  Mid upper arm        0.37        0.57              +-----------------+-------------+----------+---------+  Dist upper arm       0.35        0.57              +-----------------+-------------+----------+---------+  Antecubital fossa    0.32        0.44   branching  +-----------------+-------------+----------+---------+  Prox forearm         0.35        0.68              +-----------------+-------------+----------+---------+  Mid forearm          0.29        0.41   branching  +-----------------+-------------+----------+---------+  Dist forearm         0.19        0.23              +-----------------+-------------+----------+---------+   +-----------------+-------------+----------+---------+  Right Basilic    Diameter (cm)Depth (cm)Findings   +-----------------+-------------+----------+---------+  Mid upper arm        0.24        2.54              +-----------------+-------------+----------+---------+  Dist upper arm       0.26        1.84              +-----------------+-------------+----------+---------+  Antecubital fossa    0.36        2.19   branching  +-----------------+-------------+----------+---------+  Prox forearm         0.19        0.23   branching  +-----------------+-------------+----------+---------+   +-----------------+-------------+----------+--------------+  Left Cephalic    Diameter (cm)Depth (cm)   Findings     +-----------------+-------------+----------+--------------+  Shoulder             0.42        0.39                   +-----------------+-------------+----------+--------------+  Prox upper arm       0.46        0.38                   +-----------------+-------------+----------+--------------+  Mid upper arm        0.33  0.64     branching     +-----------------+-------------+----------+--------------+  Dist upper arm        0.40        0.34                   +-----------------+-------------+----------+--------------+  Antecubital fossa    0.27        0.55                   +-----------------+-------------+----------+--------------+  Prox forearm                            not visualized  +-----------------+-------------+----------+--------------+  Mid forearm                             not visualized  +-----------------+-------------+----------+--------------+  Dist forearm                            not visualized  +-----------------+-------------+----------+--------------+   Left basilic vein was not visualized.    Assessment/Plan:  This is a 81 y.o. female who is s/p:left UE ligation of fistula for sever steal symptoms  She still has symptoms of weakness, pain and edema.  Her steal symptoms are improving and she has good inflow to the left hand via doppler signals. I gave her instructions to do active motion with her hand, gave her a stress ball to work with and elevation of the hand and arm above her heart.   I discussed her exam and concerns for new access with Dr. Donzetta Matters.  We will wait 2-3 months before continued discussion for possible right UE access.  Her vein mapping demonstrates she has acceptable Basilic and cephalic on the right UE.  The bottom line is that it is her's to make whether or not  to move forward with new access once she has healed/recovered from sever steal on the left UE.  She will call for a f/u appointment.   Roxy Horseman  [facility] [phone]   Clinic MD:  Donzetta Matters

## 2021-09-30 ENCOUNTER — Ambulatory Visit (INDEPENDENT_AMBULATORY_CARE_PROVIDER_SITE_OTHER): Payer: Medicare Other | Admitting: Family Medicine

## 2021-09-30 ENCOUNTER — Other Ambulatory Visit: Payer: Self-pay

## 2021-09-30 VITALS — BP 147/63 | HR 85 | Wt 216.2 lb

## 2021-09-30 DIAGNOSIS — E1140 Type 2 diabetes mellitus with diabetic neuropathy, unspecified: Secondary | ICD-10-CM | POA: Diagnosis not present

## 2021-09-30 DIAGNOSIS — N186 End stage renal disease: Secondary | ICD-10-CM | POA: Diagnosis not present

## 2021-09-30 DIAGNOSIS — R197 Diarrhea, unspecified: Secondary | ICD-10-CM

## 2021-09-30 DIAGNOSIS — E1122 Type 2 diabetes mellitus with diabetic chronic kidney disease: Secondary | ICD-10-CM | POA: Diagnosis not present

## 2021-09-30 DIAGNOSIS — K458 Other specified abdominal hernia without obstruction or gangrene: Secondary | ICD-10-CM | POA: Diagnosis not present

## 2021-09-30 LAB — POCT GLYCOSYLATED HEMOGLOBIN (HGB A1C): HbA1c, POC (controlled diabetic range): 7.1 % — AB (ref 0.0–7.0)

## 2021-09-30 MED ORDER — ONETOUCH ULTRASOFT LANCETS MISC: 9 refills | Status: DC

## 2021-09-30 MED ORDER — ONETOUCH VERIO VI STRP: ORAL_STRIP | 12 refills | Status: DC

## 2021-09-30 MED ORDER — ONETOUCH VERIO W/DEVICE KIT: PACK | 0 refills | Status: DC

## 2021-09-30 NOTE — Patient Instructions (Signed)
It was wonderful to see you today.  Please bring ALL of your medications with you to every visit.   Today we talked about:  -Diarrhea for 1 week.  At this time I would avoid peanuts.  If you start to develop increased abdominal pain with nausea or vomiting please follow-up.  Continue to stay hydrated. - Diabetes continue 46 units, I would encourage you to eat on a regular basis to take your insulin. - Follow-up in 1 week with diabetes log  Please be sure to schedule follow up at the front  desk before you leave today.   If you haven't already, sign up for My Chart to have easy access to your labs results, and communication with your primary care physician.  Please call the clinic at [phone] if your symptoms worsen or you have any concerns. It was our pleasure to serve you.  Dr. Janus Molder

## 2021-09-30 NOTE — Progress Notes (Signed)
SUBJECTIVE:   CHIEF COMPLAINT / HPI:   Diabetes Current Regimen: Lantus 46-48 units nightly, endorses skipping meals and will occasionally not take insulin when she skips her meals. CBGs: 90-120 fasting Last A1c: 8.5 on 11/03/2020 Denies polyuria, polydipsia, hypoglycemia Last Eye Exam: Needs to schedule Statin: Lipitor 40 mg nightly ACE/ARB: N/A  Diarrhea X1 week. Described as soft stool and not runny. Having abdominal pain before bowel movement that is improved after. Possible association to eating peanuts. She thinks this started shortly after eating peanuts. She denies blood stool, fever, nausea, and vomiting. Denies change in medications.   PERTINENT  PMH / PSH: ESRD (TThS-denies missing any HD sessions), HTN  OBJECTIVE:   BP (!) 147/63   Pulse 85   Wt 216 lb 3.2 oz (98.1 kg)   SpO2 100%   BMI 39.54 kg/m   Physical Exam Vitals reviewed.  Constitutional:      General: She is not in acute distress.    Appearance: She is not ill-appearing, toxic-appearing or diaphoretic.  Cardiovascular:     Rate and Rhythm: Normal rate and regular rhythm.     Heart sounds: Normal heart sounds.  Pulmonary:     Effort: Pulmonary effort is normal.     Breath sounds: Normal breath sounds.  Abdominal:     General: Bowel sounds are normal. There is distension.     Palpations: Abdomen is soft. There is no mass.     Tenderness: There is no right CVA tenderness, left CVA tenderness or guarding.     Hernia: A hernia is present.  Musculoskeletal:     Right lower leg: No edema.     Left lower leg: No edema.  Neurological:     Mental Status: She is alert and oriented to person, place, and time.  Psychiatric:        Mood and Affect: Mood normal.        Behavior: Behavior normal.     ASSESSMENT/PLAN:   1. Diarrhea, unspecified type X1 week characterized as soft stools, question true diarrhea at this time.  Associated with eating peanuts.  Improving.  Abdominal exam is remarkable for  left abdominal wall hernia.  Otherwise she is not having abdominal pain and no signs of hernia incarceration.  No other infectious or systemic symptoms.  To rule out infections or electrolyte derangements with ESRD status will obtain the labs below.  Patient encouraged to follow-up if symptoms worsen or fail to improve. - CBC - Comprehensive metabolic panel  2. Type II diabetes mellitus with end-stage renal disease ([place]) Improved. At goal. Discussed eating on a regular basis to be able to take insulin. There is high probability we should be able to go down on her insulin now that she has approached goal.  -Repeat A1c in 3 months - Follow-up in 1 week with CBG log - Consider decreasing Lantus at that time - HgB A1c - Blood Glucose Monitoring Suppl (ONETOUCH VERIO) w/Device KIT; Use daily as indicated  Dispense: 1 kit; Refill: 0 - glucose blood (ONETOUCH VERIO) test strip; Use as instructed  Dispense: 100 each; Refill: 12 - Lancets (ONETOUCH ULTRASOFT) lancets; Once daily testing plus prn for hypoglycemia  Dispense: 100 each; Refill: 9  3. Painful diabetic neuropathy ([place]) - For home use only DME 4 wheeled rolling walker with seat ([id number])  4. Other specified abdominal hernia without obstruction or gangrene Encouraged to follow-up with [place] surgery as previously referred.  Patient states she has number and plans to  call.  Gerlene Fee, DO [facility]

## 2021-10-01 LAB — COMPREHENSIVE METABOLIC PANEL
ALT: 7 IU/L (ref 0–32)
AST: 12 IU/L (ref 0–40)
Albumin/Globulin Ratio: 1.5 (ref 1.2–2.2)
Albumin: 4 g/dL (ref 3.7–4.7)
Alkaline Phosphatase: 97 IU/L (ref 44–121)
BUN/Creatinine Ratio: 4 — ABNORMAL LOW (ref 12–28)
BUN: 25 mg/dL (ref 8–27)
Bilirubin Total: 0.3 mg/dL (ref 0.0–1.2)
CO2: 27 mmol/L (ref 20–29)
Calcium: 9.7 mg/dL (ref 8.7–10.3)
Chloride: 95 mmol/L — ABNORMAL LOW (ref 96–106)
Creatinine, Ser: 7.13 mg/dL — ABNORMAL HIGH (ref 0.57–1.00)
Globulin, Total: 2.7 g/dL (ref 1.5–4.5)
Glucose: 137 mg/dL — ABNORMAL HIGH (ref 70–99)
Potassium: 4.6 mmol/L (ref 3.5–5.2)
Sodium: 138 mmol/L (ref 134–144)
Total Protein: 6.7 g/dL (ref 6.0–8.5)
eGFR: 5 mL/min/{1.73_m2} — ABNORMAL LOW (ref >59)

## 2021-10-01 LAB — CBC
Hematocrit: 30 % — ABNORMAL LOW (ref 34.0–46.6)
Hemoglobin: 10.6 g/dL — ABNORMAL LOW (ref 11.1–15.9)
MCH: 34.1 pg — ABNORMAL HIGH (ref 26.6–33.0)
MCHC: 35.3 g/dL (ref 31.5–35.7)
MCV: 97 fL (ref 79–97)
Platelets: 242 10*3/uL (ref 150–450)
RBC: 3.11 x10E6/uL — ABNORMAL LOW (ref 3.77–5.28)
RDW: 13 % (ref 11.7–15.4)
WBC: 4.4 10*3/uL (ref 3.4–10.8)

## 2021-10-02 ENCOUNTER — Encounter: Payer: Self-pay | Admitting: Family Medicine

## 2021-10-02 NOTE — Assessment & Plan Note (Addendum)
Improved. At goal. Discussed eating on a regular basis to be able to take insulin. There is high probability we should be able to go down on her insulin now that she has approached goal.  -Repeat A1c in 3 months - Consider decreasing Lantus at that time

## 2021-10-26 ENCOUNTER — Telehealth: Payer: Self-pay

## 2021-10-26 NOTE — Telephone Encounter (Signed)
Pt called stating that her fingers and hand are still swollen, despite elevation, her hand is sore and hurting, her fingers are peeling, she has numbness, and some fingers she can barely touch. She is still using the ball, but it hurts to do so. Her hand is warm. She was encouraged to take OTC meds and continue elevating. Msg sent to Dr Donzetta Matters per [place] PA to determine if pt should be seen in office soon.

## 2021-10-27 ENCOUNTER — Telehealth: Payer: Self-pay

## 2021-10-27 DIAGNOSIS — N186 End stage renal disease: Secondary | ICD-10-CM

## 2021-10-27 NOTE — Telephone Encounter (Signed)
Per staff msg from Dr Donzetta Matters, pt called to schedule [place] and provider appts. Confirmed understanding.

## 2021-10-28 ENCOUNTER — Ambulatory Visit (INDEPENDENT_AMBULATORY_CARE_PROVIDER_SITE_OTHER): Payer: Medicare Other | Admitting: Vascular Surgery

## 2021-10-28 ENCOUNTER — Inpatient Hospital Stay (HOSPITAL_COMMUNITY): Admission: RE | Admit: 2021-10-28 | Payer: Medicare Other | Source: Ambulatory Visit

## 2021-10-28 ENCOUNTER — Encounter: Payer: Self-pay | Admitting: Vascular Surgery

## 2021-10-28 ENCOUNTER — Ambulatory Visit (HOSPITAL_COMMUNITY)
Admission: RE | Admit: 2021-10-28 | Discharge: 2021-10-28 | Disposition: A | Discharge status: Home / Self Care | Payer: Medicare Other | Source: Ambulatory Visit | Attending: Vascular Surgery | Admitting: Vascular Surgery

## 2021-10-28 VITALS — BP 151/49 | HR 76 | Temp 97.7°F | Resp 20 | Ht 62.0 in | Wt 216.0 lb

## 2021-10-28 DIAGNOSIS — N186 End stage renal disease: Secondary | ICD-10-CM

## 2021-10-28 NOTE — Progress Notes (Signed)
     Subjective:     Patient ID: Rachel Hobbs, female   DOB: [birth year], 81 y.o.   MRN: [id number]  HPI 81 year old female with end-stage renal disease currently dialyzing via catheter.  She recent underwent fistula placement in the left upper extremity unfortunate developed severe steal after this and the fistula was ligated in May of this year.  She now has persistent hand weakness and pain.  She follows up today with no studies for further evaluation.   Review of Systems Hand weakness on the left with pain    Objective:   Physical Exam Vitals:   10/28/21 1100  BP: (!) 151/49  Pulse: 76  Resp: 20  Temp: 97.7 F (36.5 C)  SpO2: 94%   Awake alert oriented Left upper extremity incision well-healed Palpable left brachial pulse without palpable radial or ulnar pulses. There is a strong left radial artery signal but the ulnar signal at the wrist is monophasic There is a monophasic arch signal Left fingers are dark relative to right Grip strength 1 out of 5 on the left relative to 5 out of 5 on the right  Left Doppler Findings:  +-------------+----------+--------+--------+--------+  Site         PSV (cm/s)WaveformStenosisComments  +-------------+----------+--------+--------+--------+  Brachial Dist86                                  +-------------+----------+--------+--------+--------+  Radial Prox  51                                  +-------------+----------+--------+--------+--------+  Radial Mid   76                                  +-------------+----------+--------+--------+--------+  Radial Dist  106                                 +-------------+----------+--------+--------+--------+  Ulnar Prox   15                                  +-------------+----------+--------+--------+--------+   Patent distal left brachial artery, radial artery, and flow is followed to  the proximal phalanx of the left second finger. Occluded proximal  through  distal ulnar artery, with collaterals noted.  Normal PPG flow in all five fingers on the left.      Summary:     Left: Total occlusion visualized in the left upper extremity.        Obstruction noted in the ulnar artery.     Assessment:     81 year old female status post left arm AV fistula creation with ligation for severe steal and persistent symptoms on the left    Plan:   Refer to outpatient Occupational Therapy  Duplex reviewed demonstrates occlusive disease in the ulnar artery possibly has an incomplete arch.  We will plan for arch angiogram from right common femoral approach to evaluate the left upper extremity with possible intervention on a nondialysis day in the near future.  Latanya Hemmer C. Donzetta Matters, MD [facility] Office: [phone] Pager: [phone]

## 2021-10-29 ENCOUNTER — Other Ambulatory Visit: Payer: Self-pay | Admitting: *Deleted

## 2021-10-29 DIAGNOSIS — E1151 Type 2 diabetes mellitus with diabetic peripheral angiopathy without gangrene: Secondary | ICD-10-CM

## 2021-10-29 MED ORDER — LANTUS SOLOSTAR 100 UNIT/ML ~~LOC~~ SOPN: 50.0000 [IU] | PEN_INJECTOR | Freq: Every day | SUBCUTANEOUS | 0 refills | Status: DC

## 2021-11-05 ENCOUNTER — Other Ambulatory Visit: Payer: Self-pay

## 2021-11-05 DIAGNOSIS — T82898A Other specified complication of vascular prosthetic devices, implants and grafts, initial encounter: Secondary | ICD-10-CM

## 2021-11-05 DIAGNOSIS — N186 End stage renal disease: Secondary | ICD-10-CM

## 2021-11-25 ENCOUNTER — Encounter: Payer: Self-pay | Admitting: Podiatry

## 2021-11-25 ENCOUNTER — Ambulatory Visit (INDEPENDENT_AMBULATORY_CARE_PROVIDER_SITE_OTHER): Payer: Medicare Other | Admitting: Podiatry

## 2021-11-25 DIAGNOSIS — B351 Tinea unguium: Secondary | ICD-10-CM

## 2021-11-25 DIAGNOSIS — M79609 Pain in unspecified limb: Secondary | ICD-10-CM

## 2021-11-25 DIAGNOSIS — E1151 Type 2 diabetes mellitus with diabetic peripheral angiopathy without gangrene: Secondary | ICD-10-CM | POA: Diagnosis not present

## 2021-11-25 DIAGNOSIS — Z794 Long term (current) use of insulin: Secondary | ICD-10-CM

## 2021-11-25 NOTE — Progress Notes (Signed)
This patient returns to my office for at risk foot care.  This patient requires this care by a professional since this patient will be at risk due to having chronic kidney disease and diabetes type 2.    This patient is unable to cut nails herself since the patient cannot reach her nails.These nails are painful walking and wearing shoes. Patient has painful callus on her bunion left foot. This patient presents for at risk foot care today.   General Appearance  Alert, conversant and in no acute stress.  Vascular  Dorsalis pedis and posterior tibial  pulses are not  palpable  bilaterally.  Capillary return is within normal limits  bilaterally. Temperature is within normal limits  Bilaterally.  Swelling bilaterally.  Neurologic  Senn-Weinstein monofilament wire test diminished  bilaterally. Muscle power within normal limits bilaterally.  Nails Thick disfigured discolored nails with subungual debris  from hallux to fifth toes bilaterally. No evidence of bacterial infection or drainage bilaterally.  Orthopedic  No limitations of motion  feet .  No crepitus or effusions noted.  No bony pathology or digital deformities noted. HAV  B/L.  Skin  normotropic skin with no porokeratosis noted bilaterally.  No signs of infections or ulcers noted.   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  Consent was obtained for treatment procedures.   Mechanical debridement of nails 1-5  bilaterally performed with a nail nipper.  Filed with dremel without incident. Debride callus with # 15 blade.   Return office visit    12  weeks                  Told patient to return for periodic foot care and evaluation due to potential at risk complications.   Gardiner Barefoot DPM

## 2021-11-30 ENCOUNTER — Encounter (HOSPITAL_COMMUNITY): Admission: RE | Payer: Self-pay | Source: Home / Self Care

## 2021-11-30 ENCOUNTER — Ambulatory Visit (HOSPITAL_COMMUNITY): Admission: RE | Admit: 2021-11-30 | Payer: Medicare Other | Source: Home / Self Care | Admitting: Vascular Surgery

## 2021-11-30 SURGERY — AORTIC ARCH ANGIOGRAPHY
Anesthesia: LOCAL

## 2021-12-03 ENCOUNTER — Telehealth: Payer: Self-pay

## 2021-12-03 NOTE — Telephone Encounter (Signed)
Patient contacted office requesting to reschedule her missed appointment for arch angiogram procedure on 8/14 due to she was sick. Patient rescheduled for 12/14/21- instructions reviewed. Patient verbalized understanding.

## 2021-12-07 ENCOUNTER — Ambulatory Visit: Payer: Medicare Other | Admitting: Occupational Therapy

## 2021-12-14 ENCOUNTER — Ambulatory Visit (HOSPITAL_COMMUNITY)
Admission: RE | Admit: 2021-12-14 | Discharge: 2021-12-14 | Disposition: A | Discharge status: Home / Self Care | Payer: Medicare Other | Attending: Vascular Surgery | Admitting: Vascular Surgery

## 2021-12-14 ENCOUNTER — Telehealth: Payer: Self-pay | Admitting: Vascular Surgery

## 2021-12-14 ENCOUNTER — Encounter (HOSPITAL_COMMUNITY)
Admission: RE | Disposition: A | Discharge status: Home / Self Care | Payer: Self-pay | Source: Home / Self Care | Attending: Vascular Surgery

## 2021-12-14 DIAGNOSIS — M79622 Pain in left upper arm: Secondary | ICD-10-CM | POA: Diagnosis present

## 2021-12-14 DIAGNOSIS — N186 End stage renal disease: Secondary | ICD-10-CM | POA: Diagnosis not present

## 2021-12-14 DIAGNOSIS — Z992 Dependence on renal dialysis: Secondary | ICD-10-CM | POA: Insufficient documentation

## 2021-12-14 DIAGNOSIS — T82898A Other specified complication of vascular prosthetic devices, implants and grafts, initial encounter: Secondary | ICD-10-CM

## 2021-12-14 HISTORY — PX: UPPER EXTREMITY ANGIOGRAPHY: CATH118270

## 2021-12-14 HISTORY — PX: PERIPHERAL VASCULAR BALLOON ANGIOPLASTY: CATH118281

## 2021-12-14 HISTORY — PX: AORTIC ARCH ANGIOGRAPHY: CATH118224

## 2021-12-14 LAB — POCT I-STAT, CHEM 8
BUN: 31 mg/dL — ABNORMAL HIGH (ref 8–23)
Calcium, Ion: 1.16 mmol/L (ref 1.15–1.40)
Chloride: 96 mmol/L — ABNORMAL LOW (ref 98–111)
Creatinine, Ser: 8 mg/dL — ABNORMAL HIGH (ref 0.44–1.00)
Glucose, Bld: 129 mg/dL — ABNORMAL HIGH (ref 70–99)
HCT: 31 % — ABNORMAL LOW (ref 36.0–46.0)
Hemoglobin: 10.5 g/dL — ABNORMAL LOW (ref 12.0–15.0)
Potassium: 3.6 mmol/L (ref 3.5–5.1)
Sodium: 135 mmol/L (ref 135–145)
TCO2: 29 mmol/L (ref 22–32)

## 2021-12-14 LAB — GLUCOSE, CAPILLARY: Glucose-Capillary: 119 mg/dL — ABNORMAL HIGH (ref 70–99)

## 2021-12-14 SURGERY — AORTIC ARCH ANGIOGRAPHY
Anesthesia: LOCAL

## 2021-12-14 MED ORDER — LIDOCAINE HCL (PF) 1 % IJ SOLN
INTRAMUSCULAR | Status: AC
  Filled 2021-12-14: qty 30

## 2021-12-14 MED ORDER — MORPHINE SULFATE (PF) 2 MG/ML IV SOLN: 2.0000 mg | INTRAVENOUS | Status: DC | PRN

## 2021-12-14 MED ORDER — OXYCODONE HCL 5 MG PO TABS: 5.0000 mg | ORAL_TABLET | ORAL | Status: DC | PRN

## 2021-12-14 MED ORDER — ACETAMINOPHEN 325 MG PO TABS: 650.0000 mg | ORAL_TABLET | ORAL | Status: DC | PRN

## 2021-12-14 MED ORDER — HEPARIN SODIUM (PORCINE) 1000 UNIT/ML IJ SOLN
INTRAMUSCULAR | Status: DC | PRN
  Administered 2021-12-14 (×2): 3000 [IU] via INTRAVENOUS

## 2021-12-14 MED ORDER — CLOPIDOGREL BISULFATE 300 MG PO TABS
ORAL_TABLET | ORAL | Status: AC
  Filled 2021-12-14: qty 1

## 2021-12-14 MED ORDER — HYDRALAZINE HCL 20 MG/ML IJ SOLN: 5.0000 mg | INTRAMUSCULAR | Status: DC | PRN

## 2021-12-14 MED ORDER — LABETALOL HCL 5 MG/ML IV SOLN: 10.0000 mg | INTRAVENOUS | Status: DC | PRN

## 2021-12-14 MED ORDER — SODIUM CHLORIDE 0.9% FLUSH: 3.0000 mL | Freq: Two times a day (BID) | INTRAVENOUS | Status: DC

## 2021-12-14 MED ORDER — MIDAZOLAM HCL 2 MG/2ML IJ SOLN
INTRAMUSCULAR | Status: DC | PRN
  Administered 2021-12-14: 1 mg via INTRAVENOUS

## 2021-12-14 MED ORDER — FENTANYL CITRATE (PF) 100 MCG/2ML IJ SOLN
INTRAMUSCULAR | Status: AC
  Filled 2021-12-14: qty 2

## 2021-12-14 MED ORDER — SODIUM CHLORIDE 0.9% FLUSH
3.0000 mL | INTRAVENOUS | Status: DC | PRN
Start: 2021-12-14 — End: 2021-12-14

## 2021-12-14 MED ORDER — SODIUM CHLORIDE 0.9 % IV SOLN: 250.0000 mL | INTRAVENOUS | Status: DC | PRN

## 2021-12-14 MED ORDER — CLOPIDOGREL BISULFATE 75 MG PO TABS: 75.0000 mg | ORAL_TABLET | Freq: Every day | ORAL | Status: DC

## 2021-12-14 MED ORDER — SODIUM CHLORIDE 0.9 % IV SOLN
250.0000 mL | INTRAVENOUS | Status: DC | PRN
Start: 2021-12-14 — End: 2021-12-14

## 2021-12-14 MED ORDER — HEPARIN (PORCINE) IN NACL 1000-0.9 UT/500ML-% IV SOLN
INTRAVENOUS | Status: DC | PRN
  Administered 2021-12-14 (×2): 500 mL

## 2021-12-14 MED ORDER — SODIUM CHLORIDE 0.9% FLUSH: 3.0000 mL | INTRAVENOUS | Status: DC | PRN

## 2021-12-14 MED ORDER — CLOPIDOGREL BISULFATE 300 MG PO TABS
ORAL_TABLET | ORAL | Status: DC | PRN
  Administered 2021-12-14: 300 mg via ORAL

## 2021-12-14 MED ORDER — HEPARIN (PORCINE) IN NACL 1000-0.9 UT/500ML-% IV SOLN
INTRAVENOUS | Status: AC
  Filled 2021-12-14: qty 1000

## 2021-12-14 MED ORDER — CLOPIDOGREL BISULFATE 75 MG PO TABS: 300.0000 mg | ORAL_TABLET | Freq: Once | ORAL | Status: DC

## 2021-12-14 MED ORDER — ONDANSETRON HCL 4 MG/2ML IJ SOLN: 4.0000 mg | Freq: Four times a day (QID) | INTRAMUSCULAR | Status: DC | PRN

## 2021-12-14 MED ORDER — MIDAZOLAM HCL 2 MG/2ML IJ SOLN
INTRAMUSCULAR | Status: AC
  Filled 2021-12-14: qty 2

## 2021-12-14 MED ORDER — LIDOCAINE HCL (PF) 1 % IJ SOLN
INTRAMUSCULAR | Status: DC | PRN
  Administered 2021-12-14: 15 mL

## 2021-12-14 MED ORDER — HEPARIN SODIUM (PORCINE) 1000 UNIT/ML IJ SOLN
INTRAMUSCULAR | Status: AC
Start: 2021-12-14 — End: ?
  Filled 2021-12-14: qty 10

## 2021-12-14 MED ORDER — IODIXANOL 320 MG/ML IV SOLN
INTRAVENOUS | Status: DC | PRN
  Administered 2021-12-14: 100 mL

## 2021-12-14 MED ORDER — FENTANYL CITRATE (PF) 100 MCG/2ML IJ SOLN
INTRAMUSCULAR | Status: DC | PRN
Start: 2021-12-14 — End: 2021-12-14
  Administered 2021-12-14: 50 ug via INTRAVENOUS

## 2021-12-14 MED ORDER — CLOPIDOGREL BISULFATE 75 MG PO TABS: 75.0000 mg | ORAL_TABLET | Freq: Every day | ORAL | 11 refills | Status: AC

## 2021-12-14 SURGICAL SUPPLY — 21 items
BALLN MUSTANG 4X60X135 (BALLOONS) ×3
BALLOON MUSTANG 4X60X135 (BALLOONS) IMPLANT
CATH ANGIO 5F PIGTAIL 100CM (CATHETERS) IMPLANT
CATH INFINITI VERT 5FR 125CM (CATHETERS) IMPLANT
CLOSURE MYNX CONTROL 6F/7F (Vascular Products) IMPLANT
DCB RANGER 4.0X40 135 (BALLOONS) IMPLANT
KIT ENCORE 26 ADVANTAGE (KITS) IMPLANT
KIT MICROPUNCTURE NIT STIFF (SHEATH) IMPLANT
KIT PV (KITS) ×3 IMPLANT
RANGER DCB 4.0X40 135 (BALLOONS) ×3
SHEATH PINNACLE 5F 10CM (SHEATH) IMPLANT
SHEATH PINNACLE 6F 10CM (SHEATH) IMPLANT
SHEATH PROBE COVER 6X72 (BAG) IMPLANT
SHEATH SHUTTLE SELECT 6F (SHEATH) IMPLANT
SYR MEDRAD MARK V 150ML (SYRINGE) IMPLANT
TRANSDUCER W/STOPCOCK (MISCELLANEOUS) ×3 IMPLANT
TRAY PV CATH (CUSTOM PROCEDURE TRAY) ×3 IMPLANT
TUBING CIL FLEX 10 FLL-RA (TUBING) IMPLANT
WIRE G V18X300CM (WIRE) IMPLANT
WIRE HITORQ VERSACORE ST 145CM (WIRE) IMPLANT
WIRE VERSACORE LOC 115CM (WIRE) IMPLANT

## 2021-12-14 NOTE — Telephone Encounter (Signed)
-----   Message from [name], MD sent at 12/14/2021 11:38 AM EDT ----- Rachel Hobbs [id number] 04/04/1941  12/14/2021 Pre-operative Diagnosis: Left upper extremity pain status post ligation of AV fistula  Surgeon:  Eda Paschal. Donzetta Matters, MD  Procedure Performed: 1.  Ultrasound-guided cannulation right common femoral artery 2.  Arch aortogram 3.  Left upper extremity angiogram and pullback gradient across brachial artery blockage 4.  Drug-coated balloon angioplasty left brachial artery with 4 mm Ranger 5.  Moderate sedation with fentanyl and Versed for 49 minutes 6.  Mynx device closure right common femoral artery  F/u with me or pa in 4-6 weeks.   Erlene Quan

## 2021-12-14 NOTE — H&P (Addendum)
      Subjective:    HPI   81 year old female with end-stage renal disease currently dialyzing via catheter.  She recent underwent fistula placement in the left upper extremity unfortunate developed severe steal after this and the fistula was ligated in May of this year.  She now has persistent hand weakness and pain.  She follows up today with no studies for further evaluation.     Review of Systems Hand weakness on the left with pain     Objective:    Vitals:   12/14/21 0908  BP: (!) 144/76  Pulse: 88  Resp: 18  Temp: 97.7 F (36.5 C)  SpO2: 98%     Awake alert oriented Left upper extremity incision well-healed Palpable left brachial pulse without palpable radial or ulnar pulses. There is a strong left radial artery signal but the ulnar signal at the wrist is monophasic There is a monophasic arch signal Left fingers are dark relative to right Grip strength 1 out of 5 on the left relative to 5 out of 5 on the right   Left Doppler Findings:  +-------------+----------+--------+--------+--------+  Site         PSV (cm/s)WaveformStenosisComments  +-------------+----------+--------+--------+--------+  Brachial Dist86                                  +-------------+----------+--------+--------+--------+  Radial Prox  51                                  +-------------+----------+--------+--------+--------+  Radial Mid   76                                  +-------------+----------+--------+--------+--------+  Radial Dist  106                                 +-------------+----------+--------+--------+--------+  Ulnar Prox   15                                  +-------------+----------+--------+--------+--------+   Patent distal left brachial artery, radial artery, and flow is followed to  the proximal phalanx of the left second finger. Occluded proximal through  distal ulnar artery, with collaterals noted.  Normal PPG flow in all five  fingers on the left.      Summary:     Left: Total occlusion visualized in the left upper extremity.        Obstruction noted in the ulnar artery.      Assessment:    81 year old female status post left arm AV fistula creation with ligation for severe steal and persistent symptoms on the left     Plan:    Refer to outpatient Occupational Therapy   Duplex reviewed demonstrates occlusive disease in the ulnar artery possibly has an incomplete arch.  We will plan for arch angiogram from right common femoral approach to evaluate the left upper extremity with possible intervention on a nondialysis day in the near future.   Jenalee Trevizo C. Donzetta Matters, MD [facility] Office: [phone] Pager: [phone]

## 2021-12-14 NOTE — Op Note (Signed)
    Patient name: TUNISIA LANDGREBE MRN: [id number] DOB: 1940-06-30 Sex: female  12/14/2021 Pre-operative Diagnosis: Left upper extremity pain status post ligation of AV fistula Post-operative diagnosis:  Same Surgeon:  Eda Paschal. Donzetta Matters, MD Procedure Performed: 1.  Ultrasound-guided cannulation right common femoral artery 2.  Arch aortogram 3.  Left upper extremity angiogram and pullback gradient across brachial artery blockage 4.  Drug-coated balloon angioplasty left brachial artery with 4 mm Ranger 5.  Moderate sedation with fentanyl and Versed for 49 minutes 6.  Mynx device closure right common femoral artery  Indications: 81 year old female with history of end-stage renal disease previous upper extremity accesses.  Most recently we ligated the cephalic vein fistula for severe steal and she has persistent issues with pain in the left upper extremity and numbness.  She is now indicated for left upper extremity angiography given that she does not have any palpable pulses at the wrist and a monophasic radial artery signal only.  Findings: Patient's arch was a type II arch without any proximal disease.  Left subclavian and axillary arteries were patent.  The brachial artery there was an area of abnormality which did not initially appear to be flow-limiting but there was a 30 mmHg gradient across the abnormality right at the elbow.  There is single-vessel runoff to the wrist via the radial artery the ulnar artery is occluded proximally the arch does appear intact the interosseous artery is also patent to the level of just above the wrist.  After intervention we had a 5 mmHg gradient and much better filling of all of the vessels in the forearm and a weakly palpable radial artery pulse at the wrist.   Procedure:  The patient was identified in the holding area and taken to room 8.  The patient was then placed supine on the table and prepped and draped in the usual sterile fashion.  A time out was called.   Ultrasound was used to evaluate the right common femoral artery.  This was patent.  The area was anesthetized with 1% lidocaine cannulated micropuncture needle followed by wire and sheath.  Images saved the permanent record.  Concomitantly we administered fentanyl and Versed and her vital signs were monitored throughout the case.  We placed a versa core wire followed by 5 French sheath and a pigtail into the arch of the aorta and 3000 units of heparin was administered at this time.  We performed arch aortogram.  We then cannulated the subclavian artery using a vertebral catheter and versa core wire perform left upper extremity angiography.  With the above findings we placed a long 6 French sheath and patient was given additional 3000 units of heparin.  We done a pullback gradient across the brachial artery which was 30 mmHg.  We primarily dilated this with a 4 mm balloon followed by drug-coated balloon angioplasty with 4 mm.  Completion there was no residual stenosis or dissection and the gradient was down to 5 mmHg.  Satisfied with this we elected for exchange of 6 French sheath deployed a minx device.  She tolerated this all well without immediate complication.  There is a palpable radial artery pulse at completion.   Contrast: 100 cc  Felcia Huebert C. Donzetta Matters, MD [facility] Office: [phone] Pager: [phone]

## 2021-12-15 ENCOUNTER — Encounter (HOSPITAL_COMMUNITY): Payer: Self-pay | Admitting: Vascular Surgery

## 2021-12-15 NOTE — Telephone Encounter (Signed)
Appt has been scheduled.

## 2021-12-16 ENCOUNTER — Other Ambulatory Visit: Payer: Self-pay | Admitting: General Surgery

## 2021-12-16 DIAGNOSIS — K432 Incisional hernia without obstruction or gangrene: Secondary | ICD-10-CM

## 2021-12-25 ENCOUNTER — Encounter: Payer: Self-pay | Admitting: Student

## 2021-12-25 ENCOUNTER — Ambulatory Visit (INDEPENDENT_AMBULATORY_CARE_PROVIDER_SITE_OTHER): Payer: Medicare Other | Admitting: Student

## 2021-12-25 VITALS — BP 127/54 | HR 91 | Ht 62.0 in | Wt 212.0 lb

## 2021-12-25 DIAGNOSIS — I739 Peripheral vascular disease, unspecified: Secondary | ICD-10-CM | POA: Diagnosis not present

## 2021-12-25 DIAGNOSIS — T82898A Other specified complication of vascular prosthetic devices, implants and grafts, initial encounter: Secondary | ICD-10-CM | POA: Insufficient documentation

## 2021-12-25 DIAGNOSIS — E1151 Type 2 diabetes mellitus with diabetic peripheral angiopathy without gangrene: Secondary | ICD-10-CM | POA: Diagnosis not present

## 2021-12-25 DIAGNOSIS — E118 Type 2 diabetes mellitus with unspecified complications: Secondary | ICD-10-CM | POA: Diagnosis not present

## 2021-12-25 DIAGNOSIS — E1122 Type 2 diabetes mellitus with diabetic chronic kidney disease: Secondary | ICD-10-CM | POA: Diagnosis not present

## 2021-12-25 DIAGNOSIS — N186 End stage renal disease: Secondary | ICD-10-CM

## 2021-12-25 DIAGNOSIS — E1140 Type 2 diabetes mellitus with diabetic neuropathy, unspecified: Secondary | ICD-10-CM

## 2021-12-25 DIAGNOSIS — T82898S Other specified complication of vascular prosthetic devices, implants and grafts, sequela: Secondary | ICD-10-CM

## 2021-12-25 LAB — POCT GLYCOSYLATED HEMOGLOBIN (HGB A1C): HbA1c, POC (controlled diabetic range): 7.5 % — AB (ref 0.0–7.0)

## 2021-12-25 MED ORDER — OXYCODONE-ACETAMINOPHEN 5-325 MG PO TABS: 1.0000 | ORAL_TABLET | Freq: Four times a day (QID) | ORAL | 0 refills | Status: DC | PRN

## 2021-12-25 MED ORDER — ONETOUCH VERIO VI STRP: ORAL_STRIP | 12 refills | Status: AC

## 2021-12-25 MED ORDER — INSULIN PEN NEEDLE 31G X 8 MM MISC: 3 refills | Status: AC

## 2021-12-25 MED ORDER — ONETOUCH ULTRASOFT LANCETS MISC: 9 refills | Status: AC

## 2021-12-25 MED ORDER — PREGABALIN 25 MG PO CAPS: 25.0000 mg | ORAL_CAPSULE | Freq: Every day | ORAL | 1 refills | Status: DC

## 2021-12-25 MED ORDER — LANTUS SOLOSTAR 100 UNIT/ML ~~LOC~~ SOPN: 42.0000 [IU] | PEN_INJECTOR | Freq: Every day | SUBCUTANEOUS | 3 refills | Status: AC

## 2021-12-25 MED ORDER — ONETOUCH VERIO W/DEVICE KIT: PACK | 0 refills | Status: AC

## 2021-12-25 MED ORDER — "BD DISP NEEDLE 30G X 1"" MISC": 1.0000 | Freq: Every day | 2 refills | Status: AC

## 2021-12-25 MED ORDER — ONETOUCH DELICA LANCETS 33G MISC: 0 refills | Status: AC

## 2021-12-25 NOTE — Progress Notes (Signed)
    SUBJECTIVE:   CHIEF COMPLAINT / HPI:   Rachel Hobbs is a 81 y.o. female presenting to follow up on her bilateral foot tingling and restlessness.  Past medical history includes diabetic neuropathy and peripheral vascular disease for which she takes gabapentin, ASA, and Plavix.  She was prescribed 100 mg gabapentin for her neuropathy. This medication causes her to have bad dreams at night and does not provide good symptom relief. She does not recall if she is ever taken Lyrica, although this is listed on her med list.   PERTINENT  PMH / PSH:  Type 2 diabetes: Lantus 42 units subcu daily at bedtime, last A1c 7.1 09/30/2021 Diabetic neuropathy: Lyrica 75 mg, 2 times a day; gabapentin 100 mg in the morning and 200 mg at night. Chronic pain: Percocet 5-456 mg ESRD: Complicated by steal syndrome, Percocet 5-325 mg, receives dialysis Tuesday, Thursday, Saturday. Peripheral vascular disease: ASA and Plavix Anemia of chronic disease: Last hemoglobin 10.6 09/30/2021, managed by nephrology  OBJECTIVE:   BP (!) 127/54   Pulse 91   Ht 5\' 2"  (1.575 m)   Wt 212 lb (96.2 kg)   SpO2 100%   BMI 38.78 kg/m   Chronically ill-appearing, no acute distress Cardio: Regular rate, regular rhythm, fistula murmur on exam Pulm: Clear, no wheezing, no crackles, no consolidations.  No increased work of breathing Abdominal: Soft, nontender Extremities: Bilateral lower extremity vascular edema, no injuries to the bilateral feet.  Toenails short and well maintained.  ASSESSMENT/PLAN:   DM type 2, controlled, with complication (HCC) Y5W today 7.5.  Continue 42 units subcu Lantus daily. - Recheck A1c in 3 months - Refilled Lantus, test strips, needles - Spoke with patient about getting diabetic eye exam up-to-date  Painful diabetic neuropathy ([place]) Suspect her leg tingling and restlessness is due to her diabetic neuropathy.  Also in the differential could be worsening PAD and vitamin B12 deficiency. - Check  vitamin B12 - Continue to follow with vascular surgery - Prescribe Lyrica 25 mg daily - Discontinue gabapentin - Follow-up in 4 weeks or sooner if symptoms do not improve - Sent in DME request for walker for home use  Steal syndrome as complication of dialysis access [facility]) Patient is still having severe pain from her procedure with vascular surgery to correct her fistula. - Refill Percocet 5-325 mg tablets 1 tablet every 6 hours as needed - Follow-up with vascular surgery and nephrology   Darci Current, [place]

## 2021-12-25 NOTE — Assessment & Plan Note (Signed)
Suspect her leg tingling and restlessness is due to her diabetic neuropathy.  Also in the differential could be worsening PAD and vitamin B12 deficiency. - Check vitamin B12 - Continue to follow with vascular surgery - Prescribe Lyrica 25 mg daily - Discontinue gabapentin - Follow-up in 4 weeks or sooner if symptoms do not improve - Sent in DME request for walker for home use

## 2021-12-25 NOTE — Assessment & Plan Note (Signed)
Patient is still having severe pain from her procedure with vascular surgery to correct her fistula. - Refill Percocet 5-325 mg tablets 1 tablet every 6 hours as needed - Follow-up with vascular surgery and nephrology

## 2021-12-25 NOTE — Assessment & Plan Note (Addendum)
A1c today 7.5.  Continue 42 units subcu Lantus daily. - Recheck A1c in 3 months - Refilled Lantus, test strips, needles - Spoke with patient about getting diabetic eye exam up-to-date

## 2021-12-25 NOTE — Patient Instructions (Signed)
It was great to see you today! Thank you for choosing [facility] for your primary care.   Today we addressed: Your diabetic neuropathy.  I sent in a prescription for 25 mg Lyrica that you can take daily at bedtime.  I sent in 30 pills with 1 refill.  If you find this medication works and you would like to continue it let me know and I can send in another prescription. You are due for your diabetic eye exam.  If you would like referral to an eye doctor please let me know we can set that up. I refilled your insulin, insulin needles, and glucose test strips.  Your A1c today was 7.5.  Which is about the same as it was last time.  Continue your 42 units insulin daily.  If you haven't already, sign up for My Chart to have easy access to your labs results, and communication with your primary care physician.  We are checking some labs today. If they are abnormal, I will call you. If they are normal, I will send you a MyChart message (if it is active) or a letter in the mail. If you do not hear about your labs in the next 2 weeks, please call the office.   You should return to our clinic Return in about 4 weeks (around 01/22/2022).  I recommend that you always bring your medications to each appointment as this makes it easy to ensure you are on the correct medications and helps [place] not miss refills when you need them.  Please arrive 15 minutes before your appointment to ensure smooth check in process.  We appreciate your efforts in making this happen.  Please call the clinic at [phone] if your symptoms worsen or you have any concerns.  Thank you for allowing me to participate in your care, Dr. Sabra Heck

## 2022-01-05 ENCOUNTER — Telehealth: Payer: Self-pay

## 2022-01-05 NOTE — Telephone Encounter (Signed)
Community message sent to [employer] Team for processing.   Will await response.

## 2022-01-05 NOTE — Telephone Encounter (Signed)
[employer] confirmed order.

## 2022-01-12 ENCOUNTER — Ambulatory Visit: Payer: Medicare Other | Admitting: Podiatry

## 2022-01-13 ENCOUNTER — Other Ambulatory Visit: Payer: Medicare Other

## 2022-01-15 ENCOUNTER — Ambulatory Visit
Admission: RE | Admit: 2022-01-15 | Discharge: 2022-01-15 | Disposition: A | Discharge status: Home / Self Care | Payer: Medicare Other | Source: Ambulatory Visit | Attending: General Surgery | Admitting: General Surgery

## 2022-01-15 DIAGNOSIS — K432 Incisional hernia without obstruction or gangrene: Secondary | ICD-10-CM

## 2022-01-27 ENCOUNTER — Ambulatory Visit (INDEPENDENT_AMBULATORY_CARE_PROVIDER_SITE_OTHER): Payer: Medicare Other | Admitting: Physician Assistant

## 2022-01-27 VITALS — BP 148/74 | HR 86 | Temp 97.2°F | Resp 18 | Ht 62.0 in | Wt 212.3 lb

## 2022-01-27 DIAGNOSIS — N186 End stage renal disease: Secondary | ICD-10-CM | POA: Diagnosis not present

## 2022-01-27 DIAGNOSIS — Z992 Dependence on renal dialysis: Secondary | ICD-10-CM

## 2022-01-27 NOTE — Progress Notes (Signed)
Established Dialysis Access   History of Present Illness   Rachel Hobbs is a 81 y.o. (28-Sep-1940) female who presents for follow-up of previous dialysis access.  She has a history of left brachiocephalic fistula creation on 08/14/2021 by Dr. Donzetta Matters.  She then developed severe steal symptoms and required ligation of her fistula on 09/04/2021.  She continued to have persistent left hand weakness and pain with a nonpalpable left radial pulse, so she had a arch angiogram with angioplasty of the left brachial artery on 12/14/2021 by Dr. Donzetta Matters.  She presents today for follow-up.  She states that she still experiences some numbness and pain in her left pointer, middle, and ring finger, however it is much better than it used to be.  She has regained most of her left hand strength and sensation as well.  She believes she is progressing well.   She is currently dialyzing through a right IJ TDC on T,Th,Sat.  Current Outpatient Medications  Medication Sig Dispense Refill   aspirin 81 MG tablet Take 81 mg by mouth every other day.     atorvastatin (LIPITOR) 40 MG tablet TAKE 1 TABLET(40 MG) BY MOUTH DAILY AT 6 PM Strength: 40 mg 90 tablet 3   Blood Glucose Monitoring Suppl (ONETOUCH VERIO) w/Device KIT Use daily as indicated 1 kit 0   calcium acetate (PHOSLO) 667 MG capsule Take 667-1,334 mg by mouth See admin instructions. Take 1334 mg with each meal and 667 mg with each snack     clopidogrel (PLAVIX) 75 MG tablet Take 1 tablet (75 mg total) by mouth daily. 30 tablet 11   glucose blood (ONETOUCH VERIO) test strip Use as instructed 100 each 12   Insulin Pen Needle 31G X 8 MM MISC BD UltraFine III Pen Needles. For use with insulin pen device. Inject insulin 2X daily 100 each 3   isosorbide mononitrate (IMDUR) 30 MG 24 hr tablet Take 30 mg by mouth daily.     Lancets (ONETOUCH ULTRASOFT) lancets Once daily testing plus prn for hypoglycemia 100 each 9   LANTUS SOLOSTAR 100 UNIT/ML Solostar Pen Inject 42 Units  into the skin at bedtime. PLEASE MAKE AN APPOINTMENT TO SEE PCP 15 mL 3   naloxone (NARCAN) nasal spray 4 mg/0.1 mL Use in both nostrils if signs of opioid OD 1 each 2   NEEDLE, DISP, 30 G (B-D DISP NEEDLE 30GX1") 30G X 1" MISC 1 each by Does not apply route daily. 100 each 2   OneTouch Delica Lancets 40X MISC Use daily as indicated 100 each 0   oxyCODONE-acetaminophen (PERCOCET) 5-325 MG tablet Take 1 tablet by mouth every 6 (six) hours as needed for severe pain. 10 tablet 0   pregabalin (LYRICA) 25 MG capsule Take 1 capsule (25 mg total) by mouth at bedtime. 30 capsule 1   Current Facility-Administered Medications  Medication Dose Route Frequency Provider Last Rate Last Admin   oxymetazoline (AFRIN) 0.05 % nasal spray 1 spray  1 spray Each Nare Once Dameron, Marisa, DO        REVIEW OF SYSTEMS (negative unless checked):   Cardiac:  _0  Chest pain or chest pressure? _1  Shortness of breath upon activity? _2  Shortness of breath when lying flat? _3  Irregular heart rhythm?  Vascular:  _4  Pain in calf, thigh, or hip brought on by walking? _5  Pain in feet at night that wakes you up from your sleep? _6  Blood clot in your veins? _7  Leg swelling?  Pulmonary:  _8  Oxygen  at home? _0  Productive cough? _1  Wheezing?  Neurologic:  _2  Sudden weakness in arms or legs? _3  Sudden numbness in arms or legs? _4  Sudden onset of difficult speaking or slurred speech? _5  Temporary loss of vision in one eye? _6  Problems with dizziness?  Gastrointestinal:  _7  Blood in stool? _8  Vomited blood?  Genitourinary:  _9  Burning when urinating? _10  Blood in urine?  Psychiatric:  _11  Major depression  Hematologic:  _12  Bleeding problems? _13  Problems with blood clotting?  Dermatologic:  _14  Rashes or ulcers?  Constitutional:  _15  Fever or chills?  Ear/Nose/Throat:  _16  Change in hearing? _17  Nose bleeds? _18  Sore throat?  Musculoskeletal:  _19  Back pain? _20  Joint pain? _21  Muscle  pain?   Physical Examination   Vitals:   01/27/22 1511  BP: (!) 148/74  Pulse: 86  Resp: 18  Temp: (!) 97.2 F (36.2 C)  TempSrc: Temporal  SpO2: 100%  Weight: 212 lb 4.8 oz (96.3 kg)  Height: _22  (1.575 m)   Body mass index is 38.83 kg/m.  General:  WDWN in NAD; vital signs documented above Gait: Not observed HENT: WNL, normocephalic Pulmonary: normal non-labored breathing , without Rales, rhonchi,  wheezing Cardiac: regular HR, without murmurs without carotid bruit Abdomen: soft, NT, no masses Skin: without rashes Vascular Exam/Pulses: Faintly palpable left radial pulse.  Brisk L radial and ulnar doppler signals Extremities: without ischemic changes, without gangrene , without cellulitis; without open wounds; some tenderness to palpation of left pointer, middle, and ring fingers. Musculoskeletal: no muscle wasting or atrophy  Neurologic: A&O X 3;  No focal weakness or paresthesias are detected Psychiatric:  The pt has Normal affect.   Medical Decision Making   Rachel Hobbs is a 81 y.o. female who presents for follow up s/p ligation of left AV fistula due to steal. She also required left brachial artery angioplasty on 12/14/2021  Her steal symptoms have greatly improved.  She has regained most of the motor and sensation in her left hand.  She is still experiencing some pain in her fingers, however this is much better than before. She has a faintly palpable left radial pulse.  She has a brisk L radial and ulnar Doppler signal She has no signs of ischemic changes in her left hand or fingers I believe her symptoms will continue to improve. I have encouraged her to continue to exercise her left hand. She can follow up with [place] as needed. She is aware to call us if she would like new permanent dialysis access in the future   Vicente Serene PA-C [facility] [place] Office: [facility] MD: Donzetta Matters

## 2022-02-01 ENCOUNTER — Encounter (INDEPENDENT_AMBULATORY_CARE_PROVIDER_SITE_OTHER): Payer: Medicare Other | Admitting: Ophthalmology

## 2022-02-24 ENCOUNTER — Ambulatory Visit: Payer: Medicare Other | Admitting: Podiatry

## 2022-04-16 ENCOUNTER — Encounter: Payer: Self-pay | Admitting: Family Medicine

## 2022-04-16 ENCOUNTER — Ambulatory Visit (INDEPENDENT_AMBULATORY_CARE_PROVIDER_SITE_OTHER): Payer: Medicare Other | Admitting: Family Medicine

## 2022-04-16 VITALS — BP 114/58 | HR 81 | Ht 62.0 in | Wt 209.5 lb

## 2022-04-16 DIAGNOSIS — R0609 Other forms of dyspnea: Secondary | ICD-10-CM

## 2022-04-16 NOTE — Progress Notes (Signed)
SUBJECTIVE:   CHIEF COMPLAINT / HPI:  Chief Complaint  Patient presents with   Shortness of Breath    Patient reports she has been feeling SOB with exertion for the past 2 weeks. She reports she feels SOB when walking to her living room. Denies chest pain, orthopnea, PND, leg swelling, fever, chills, cough, congestion. She has not missed any dialysis sessions. She had a recent cold which is now resolved. No recent travel.  Patient also has a large incisional hernia on the left side of her abdomen which she states has been getting larger.  She has seen general surgery but it sounds like they do not want to operate on the hernia due to its size.  On most recent CT abdomen back in September 2023 it appears the hernia measured 17.3 x 10.3 cm  PERTINENT  PMH / PSH: T2DM, CHF, HTN, morbid obesity, ESRD Most recent echocardiogram 11/2020 with EF 84%, grade 1 diastolic dysfunction  Patient Care Team: Arlyce Dice, MD as PCP - General (Family Medicine) Clent Jacks, MD as Consulting Physician (Ophthalmology) Corliss Parish, MD (Nephrology) Ralene Bathe, MD (Ophthalmology) Bensimhon, Shaune Pascal, MD (Cardiology) Center, [facility] Gardiner Barefoot, DPM as Consulting Physician (Podiatry)   OBJECTIVE:   BP (!) 114/58   Pulse 81   Ht 5\' 2"  (1.575 m)   Wt 209 lb 8 oz (95 kg)   SpO2 100%   BMI 38.32 kg/m   Physical Exam Constitutional:      General: She is not in acute distress. HENT:     Head: Normocephalic and atraumatic.  Cardiovascular:     Rate and Rhythm: Normal rate and regular rhythm.     Heart sounds: Normal heart sounds.  Pulmonary:     Effort: Pulmonary effort is normal. No respiratory distress.     Breath sounds: Normal breath sounds.     Comments: Breath sounds somewhat diminished but otherwise clear Abdominal:     Palpations: Abdomen is soft.     Comments: Fairly large protrusion in her abdomen on the left side  Musculoskeletal:     Cervical back:  Neck supple.  Neurological:     Mental Status: She is alert.         09/30/2021    2:59 PM  Depression screen PHQ 2/9  Decreased Interest 2  Down, Depressed, Hopeless 0  PHQ - 2 Score 2  Altered sleeping 2  Tired, decreased energy 2  Change in appetite 0  Feeling bad or failure about yourself  0  Trouble concentrating 0  Moving slowly or fidgety/restless 0  Suicidal thoughts 0  PHQ-9 Score 6     Wt Readings from Last 3 Encounters:  04/16/22 209 lb 8 oz (95 kg)  01/27/22 212 lb 4.8 oz (96.3 kg)  12/25/21 212 lb (96.2 kg)        ASSESSMENT/PLAN:   1. Dyspnea on exertion Dyspnea on exertion for the past 2 weeks with unclear trigger.  Maintaining adequate oxygen saturation with ambulation, does not appear volume overloaded on exam.  I do wonder if large abdominal ventral wall hernia could be compressing on her diaphragm and causing symptoms.  Will evaluate further with labs and imaging.  Will place referral to cardiology, may benefit from stress test of some sort. - CBC with Differential - Comprehensive metabolic panel - DG Chest 2 View; Future - Ambulatory referral to Cardiology   - return precautions discussed  Return in about 2 weeks (around 04/30/2022) for f/u diabetes.  Zola Button, MD [place]

## 2022-04-16 NOTE — Patient Instructions (Addendum)
It was nice seeing you today!  You could try capsaicin cream for your neuropathy.  I am referring you to cardiology.  Get in touch with your surgeon about your hernia.  Get your x-ray here, no appointment needed. [facility] Address: [address], [place], [place] [address] Phone: [phone]   If your shortness of breath is getting worse or you develop severe chest pain, please give our office a call or proceed to the ED.  Stay well, Zola Button, MD [place] [phone]  --  Make sure to check out at the front desk before you leave today.  Please arrive at least 15 minutes prior to your scheduled appointments.  If you had blood work today, I will send you a MyChart message or a letter if results are normal. Otherwise, I will give you a call.  If you had a referral placed, they will call you to set up an appointment. Please give [place] a call if you don't hear back in the next 2 weeks.  If you need additional refills before your next appointment, please call your pharmacy first.

## 2022-04-17 LAB — COMPREHENSIVE METABOLIC PANEL
ALT: 17 IU/L (ref 0–32)
AST: 15 IU/L (ref 0–40)
Albumin/Globulin Ratio: 1.6 (ref 1.2–2.2)
Albumin: 4.3 g/dL (ref 3.8–4.8)
Alkaline Phosphatase: 119 IU/L (ref 44–121)
BUN/Creatinine Ratio: 4 — ABNORMAL LOW (ref 12–28)
BUN: 26 mg/dL (ref 8–27)
Bilirubin Total: 0.3 mg/dL (ref 0.0–1.2)
CO2: 28 mmol/L (ref 20–29)
Calcium: 9.6 mg/dL (ref 8.7–10.3)
Chloride: 91 mmol/L — ABNORMAL LOW (ref 96–106)
Creatinine, Ser: 6.12 mg/dL — ABNORMAL HIGH (ref 0.57–1.00)
Globulin, Total: 2.7 g/dL (ref 1.5–4.5)
Glucose: 184 mg/dL — ABNORMAL HIGH (ref 70–99)
Potassium: 4.6 mmol/L (ref 3.5–5.2)
Sodium: 137 mmol/L (ref 134–144)
Total Protein: 7 g/dL (ref 6.0–8.5)
eGFR: 6 mL/min/{1.73_m2} — ABNORMAL LOW (ref >59)

## 2022-04-17 LAB — CBC WITH DIFFERENTIAL/PLATELET
Basophils Absolute: 0 10*3/uL (ref 0.0–0.2)
Basos: 1 %
EOS (ABSOLUTE): 0.2 10*3/uL (ref 0.0–0.4)
Eos: 3 %
Hematocrit: 36 % (ref 34.0–46.6)
Hemoglobin: 12.5 g/dL (ref 11.1–15.9)
Immature Grans (Abs): 0 10*3/uL (ref 0.0–0.1)
Immature Granulocytes: 1 %
Lymphocytes Absolute: 0.7 10*3/uL (ref 0.7–3.1)
Lymphs: 14 %
MCH: 34.1 pg — ABNORMAL HIGH (ref 26.6–33.0)
MCHC: 34.7 g/dL (ref 31.5–35.7)
MCV: 98 fL — ABNORMAL HIGH (ref 79–97)
Monocytes Absolute: 0.5 10*3/uL (ref 0.1–0.9)
Monocytes: 9 %
Neutrophils Absolute: 3.8 10*3/uL (ref 1.4–7.0)
Neutrophils: 72 %
Platelets: 215 10*3/uL (ref 150–450)
RBC: 3.67 x10E6/uL — ABNORMAL LOW (ref 3.77–5.28)
RDW: 13.4 % (ref 11.7–15.4)
WBC: 5.2 10*3/uL (ref 3.4–10.8)

## 2022-04-20 ENCOUNTER — Ambulatory Visit
Admission: RE | Admit: 2022-04-20 | Discharge: 2022-04-20 | Disposition: A | Discharge status: Home / Self Care | Payer: Medicare Other | Source: Ambulatory Visit | Attending: Family Medicine | Admitting: Family Medicine

## 2022-04-20 DIAGNOSIS — R0609 Other forms of dyspnea: Secondary | ICD-10-CM

## 2022-04-22 ENCOUNTER — Telehealth: Payer: Self-pay | Admitting: Family Medicine

## 2022-04-22 ENCOUNTER — Encounter: Payer: Self-pay | Admitting: Family Medicine

## 2022-04-22 NOTE — Telephone Encounter (Signed)
Patient called x2 (first attempt yesterday afternoon). VM left requesting patient call for results. Labs are stable with no acute concerns and the previous chest x-ray was normal.  Luvina Poirier, DO

## 2022-04-23 ENCOUNTER — Ambulatory Visit: Payer: Medicare Other | Admitting: Podiatry

## 2022-04-23 NOTE — Telephone Encounter (Signed)
Patient returns call to nurse line. Provided with message per Dr. Oleh Genin.   Talbot Grumbling, RN

## 2022-04-30 ENCOUNTER — Encounter: Payer: Self-pay | Admitting: Podiatry

## 2022-04-30 ENCOUNTER — Ambulatory Visit (INDEPENDENT_AMBULATORY_CARE_PROVIDER_SITE_OTHER): Payer: Medicare Other | Admitting: Podiatry

## 2022-04-30 DIAGNOSIS — M79609 Pain in unspecified limb: Secondary | ICD-10-CM

## 2022-04-30 DIAGNOSIS — M792 Neuralgia and neuritis, unspecified: Secondary | ICD-10-CM

## 2022-04-30 DIAGNOSIS — B351 Tinea unguium: Secondary | ICD-10-CM | POA: Diagnosis not present

## 2022-04-30 DIAGNOSIS — Z794 Long term (current) use of insulin: Secondary | ICD-10-CM

## 2022-04-30 DIAGNOSIS — L84 Corns and callosities: Secondary | ICD-10-CM

## 2022-04-30 DIAGNOSIS — E1151 Type 2 diabetes mellitus with diabetic peripheral angiopathy without gangrene: Secondary | ICD-10-CM | POA: Diagnosis not present

## 2022-04-30 NOTE — Progress Notes (Signed)
This patient returns to my office for at risk foot care.  This patient requires this care by a professional since this patient will be at risk due to having chronic kidney disease and diabetes type 2.    This patient is unable to cut nails herself since the patient cannot reach her nails.These nails are painful walking and wearing shoes. Patient has painful callus on her bunion left foot. This patient presents for at risk foot care today.   General Appearance  Alert, conversant and in no acute stress.  Vascular  Dorsalis pedis and posterior tibial  pulses are not  palpable  bilaterally.  Capillary return is within normal limits  bilaterally. Temperature is within normal limits  Bilaterally.  Swelling bilaterally.  Neurologic  Senn-Weinstein monofilament wire test diminished  bilaterally. Muscle power within normal limits bilaterally.  Nails Thick disfigured discolored nails with subungual debris  from hallux to fifth toes bilaterally. No evidence of bacterial infection or drainage bilaterally.  Orthopedic  No limitations of motion  feet .  No crepitus or effusions noted.  No bony pathology or digital deformities noted. HAV  B/L.  Skin  normotropic skin with no porokeratosis noted bilaterally.  No signs of infections or ulcers noted.   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  Consent was obtained for treatment procedures.   Mechanical debridement of nails 1-5  bilaterally performed with a nail nipper.  Filed with dremel without incident. Debride callus with dremel tool..   Return office visit    12  weeks                  Told patient to return for periodic foot care and evaluation due to potential at risk complications.   Gardiner Barefoot DPM

## 2022-05-04 ENCOUNTER — Ambulatory Visit: Payer: Medicare Other | Admitting: Podiatry

## 2022-05-19 ENCOUNTER — Encounter: Payer: Self-pay | Admitting: Internal Medicine

## 2022-05-19 ENCOUNTER — Ambulatory Visit: Payer: Medicare Other | Attending: Internal Medicine | Admitting: Internal Medicine

## 2022-05-19 VITALS — BP 115/69 | HR 96 | Ht 62.0 in | Wt 210.0 lb

## 2022-05-19 DIAGNOSIS — R0600 Dyspnea, unspecified: Secondary | ICD-10-CM | POA: Diagnosis not present

## 2022-05-19 NOTE — Progress Notes (Signed)
Cardiology Office Note:    Date:  05/19/2022   ID:  Rachel Hobbs, DOB Sep 17, 1940, MRN [id number]  PCP:  Arlyce Dice, MD   [place] Providers Cardiologist:  None     Referring MD: Arlyce Dice, MD   Chief Complaint  Patient presents with   New Patient (Initial Visit)   Shortness of Breath  DOE  History of Present Illness:    Rachel Hobbs is a 82 y.o. female with a hx of T2DM A1c 7.1, ESRD Tue, Thurs Saturday, s/p balloon angioplasty of the L brachial artery 12/14/2021, using IHD catheter, large incisional hernia on the left side of her abdomen which she states has been getting larger.  referral for [place] for the last month. She cannot walk far until she gets SOB. Her O2 sat has been fine. No chest pain or pressure.  Her dry wt is 209/210. No chest pressure. No PND/orthopnea. No significant LE edema. Blood pressures have been good. She walks with a cain.   She had decompensated CHF at ~73 with worsening renal function, then stared IHD.  No hx of CHF. BNP 08/2021 69. Hgb 10  Cardiology Studies 11/26/2020:TTE- EF nl, moderate LVH, RVSP 37, no sig mr  Past Medical History:  Diagnosis Date   Anemia of renal disease 06/16/2006   Qualifier: Diagnosis of  By: Marinell Blight, Dawn     Arthritis    CHF (congestive heart failure) ([place])    COVID 2021   Depression    Diabetes mellitus    type 2   Hernia    Hyperlipidemia    Hypertension    Iron deficiency anemia    LEG EDEMA, BILATERAL 12/03/2008   Qualifier: Diagnosis of  By: Carlena Sax  MD, Colletta [place]     Low iron    Nodule of soft tissue 06/22/2012   Peripheral vascular disease ([place])    in legs   Pneumonia    teenager   Renal disorder    CKD - dialysis T/TH/Sa   Shortness of breath dyspnea    with exertion   SMALL BOWEL OBSTRUCTION, HX OF 02/15/2007   Qualifier: Diagnosis of  By: [name], HEIDI     Type 2 diabetes mellitus with diabetic peripheral angiopathy without gangrene ([place]) 12/16/2015    Past  Surgical History:  Procedure Laterality Date   ABDOMINAL HYSTERECTOMY     in the 17's   AORTIC ARCH ANGIOGRAPHY N/A 12/14/2021   Procedure: AORTIC ARCH ANGIOGRAPHY;  Surgeon: [name], MD;  Location: [place] [facility];  Service: Cardiovascular;  Laterality: N/A;   AV FISTULA PLACEMENT Left 12/05/2015   Procedure: RADIOCEPHALIC VERSUS BRACHIOCEPHALIC ARTERIOVENOUS (AV) FISTULA CREATION;  Surgeon: [name], MD;  Location: [place];  Service: Vascular;  Laterality: Left;   AV FISTULA PLACEMENT Left 05/20/2017   Procedure: INSERTION OF ARTERIOVENOUS (AV) GORE-TEX VASCULAR STRETCH 4-7 GRAFT ARM LEFT UPPER ARM;  Surgeon: [name], MD;  Location: [place];  Service: Vascular;  Laterality: Left;   AV FISTULA PLACEMENT Left 08/14/2021   Procedure: LEFT ARM ARTERIOVENOUS (AV) FISTULA CREATION;  Surgeon: [name], MD;  Location: [place];  Service: Vascular;  Laterality: Left;   [place] Left 06/04/2016   Procedure: FIRST STAGE BASILIC VEIN TRANSPOSITION;  Surgeon: [name], MD;  Location: [place];  Service: Vascular;  Laterality: Left;   [place] Left 08/04/2016   Procedure: LEFT UPPER ARM [place], SECOND STAGE;  Surgeon: [name], MD;  Location: [place];  Service: Vascular;  Laterality: Left;   HERNIA REPAIR     umbilical in the 25'D   INSERTION OF DIALYSIS CATHETER Right 12/05/2015   Procedure: INSERTION OF DIALYSIS CATHETER;  Surgeon: [name], MD;  Location: [place];  Service: Vascular;  Laterality: Right;   IR THROMBECTOMY AV FISTULA W/THROMBOLYSIS/PTA INC/SHUNT/IMG LEFT Left 05/09/2019   IR US GUIDE VASC ACCESS LEFT  05/09/2019   LIGATION OF COMPETING BRANCHES OF ARTERIOVENOUS FISTULA Left 09/04/2021   Procedure: LIGATION OF LEFT ARM CEPHALIC VEIN;  Surgeon: [name], MD;  Location: [facility];  Service: Vascular;  Laterality: Left;   PERIPHERAL VASCULAR BALLOON ANGIOPLASTY  12/14/2021    Procedure: PERIPHERAL VASCULAR BALLOON ANGIOPLASTY;  Surgeon: [name], MD;  Location: [place];  Service: Cardiovascular;;   UPPER EXTREMITY ANGIOGRAPHY Left 12/14/2021   Procedure: Upper Extremity Angiography;  Surgeon: [name], MD;  Location: [place] [facility];  Service: Cardiovascular;  Laterality: Left;   UPPER EXTREMITY VENOGRAPHY Bilateral 06/15/2021   Procedure: UPPER EXTREMITY VENOGRAPHY;  Surgeon: [name], MD;  Location: [place] [facility];  Service: Cardiovascular;  Laterality: Bilateral;    Current Medications: Current Meds  Medication Sig   aspirin 81 MG tablet Take 81 mg by mouth every other day.   atorvastatin (LIPITOR) 40 MG tablet TAKE 1 TABLET(40 MG) BY MOUTH DAILY AT 6 PM Strength: 40 mg   Blood Glucose Monitoring Suppl (ONETOUCH VERIO) w/Device KIT Use daily as indicated   calcium acetate (PHOSLO) 667 MG capsule Take 667-1,334 mg by mouth See admin instructions. Take 1334 mg with each meal and 667 mg with each snack   clopidogrel (PLAVIX) 75 MG tablet Take 1 tablet (75 mg total) by mouth daily.   gabapentin (NEURONTIN) 100 MG capsule Take 100 mg by mouth 3 (three) times daily.   glucose blood (ONETOUCH VERIO) test strip Use as instructed   Insulin Pen Needle 31G X 8 MM MISC BD UltraFine III Pen Needles. For use with insulin pen device. Inject insulin 2X daily   isosorbide mononitrate (IMDUR) 30 MG 24 hr tablet Take 30 mg by mouth daily.   Lancets (ONETOUCH ULTRASOFT) lancets Once daily testing plus prn for hypoglycemia   LANTUS SOLOSTAR 100 UNIT/ML Solostar Pen Inject 42 Units into the skin at bedtime. PLEASE MAKE AN APPOINTMENT TO SEE PCP   naloxone (NARCAN) nasal spray 4 mg/0.1 mL Use in both nostrils if signs of opioid OD   NEEDLE, DISP, 30 G (B-D DISP NEEDLE 30GX1") 30G X 1" MISC 1 each by Does not apply route daily.   OneTouch Delica Lancets 66Y MISC Use daily as indicated   oxyCODONE-acetaminophen  (PERCOCET) 5-325 MG tablet Take 1 tablet by mouth every 6 (six) hours as needed for severe pain.   [DISCONTINUED] pregabalin (LYRICA) 25 MG capsule Take 1 capsule (25 mg total) by mouth at bedtime.   Current Facility-Administered Medications for the 05/19/22 encounter (Office Visit) with Janina Mayo, MD  Medication   oxymetazoline (AFRIN) 0.05 % nasal spray 1 spray     Allergies:   Patient has no known allergies.   Social History   Socioeconomic History   Marital status: Divorced    Spouse name: Not on file   Number of children: 1   Years of education: 12   Highest education level: 12th grade  Occupational History   Occupation: Retired- Chief Financial Officer: RETIRED  Tobacco Use   Smoking status: Never    Passive exposure: Never   Smokeless tobacco: Never  Vaping Use   Vaping Use: Never used  Substance and Sexual Activity   Alcohol use: No   Drug use: No   Sexual activity: Not Currently  Other Topics Concern   Not on file  Social History Narrative   Patient lives with daughter [name])   Important Relationships Daughter   Education / Work:  12 th grade/ 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)    Interests / Fun: Sings in choir at [place], going to [employer], and going to [employer]' games and school events    Current Stressors: Denies   Religious / Personal Beliefs: "I believe in God."         *Updated 03/2020*                                                                                                          Social Determinants of Health   Financial Resource Strain: [place]  (04/16/2020)   Overall Financial Resource Strain (CARDIA)    Difficulty of Paying Living Expenses: Not hard at all  Food Insecurity: No Food Insecurity (04/16/2020)   Hunger Vital Sign    Worried About Running Out of Food in the Last Year: Never true    [place] in the Last Year: Never true  Transportation Needs: No Transportation Needs (04/16/2020)   PRAPARE -  Hydrologist (Medical): No    Lack of Transportation (Non-Medical): No  Physical Activity: Inactive (04/16/2020)   Exercise Vital Sign    Days of Exercise per Week: 0 days    Minutes of Exercise per Session: 0 min  Stress: No Stress Concern Present (04/16/2020)   [place]    Feeling of Stress : Not at all  Social Connections: Moderately Integrated (04/16/2020)   Social Connection and Isolation Panel [NHANES]    Frequency of Communication with Friends and Family: More than three times a week    Frequency of Social Gatherings with Friends and Family: More than three times a week    Attends Religious Services: More than 4 times per year    Active Member of [employer] or Organizations: Yes    Attends Music therapist: More than 4 times per year    Marital Status: Divorced     Family History: The patient's family history includes COPD in her father; Cancer in her father; Diabetes in her daughter; Hypertension in her father and mother; Kidney disease in her mother and sister.  ROS:   Please see the history of present illness.     All other systems reviewed and are negative.  EKGs/Labs/Other Studies Reviewed:    The following studies were reviewed today:   EKG:  EKG is  ordered today.  The ekg ordered today demonstrates   05/19/2022- NSR, LAD, LVH, poor R wave progression  Recent Labs: 09/12/2021: B Natriuretic Peptide 69.6 04/16/2022: ALT 17;  BUN 26; Creatinine, Ser 6.12; Hemoglobin 12.5; Platelets 215; Potassium 4.6; Sodium 137   Recent Lipid Panel    Component Value Date/Time   CHOL 132 08/23/2016 1158   TRIG 134 08/23/2016 1158   TRIG 188 04/22/2008 0000   HDL 54 08/23/2016 1158   CHOLHDL 2.4 08/23/2016 1158   CHOLHDL 2.8 06/27/2013 1508   VLDL 20 06/27/2013 1508   LDLCALC 51 08/23/2016 1158     Risk Assessment/Calculations:    Physical Exam:    VS:  Vitals:    05/19/22 1544  BP: 115/69  Pulse: 96    Wt Readings from Last 3 Encounters:  05/19/22 210 lb (95.3 kg)  04/16/22 209 lb 8 oz (95 kg)  01/27/22 212 lb 4.8 oz (96.3 kg)     GEN: Sitting in a wheel chair, Well nourished, well developed in no acute distress HEENT: Normal, tunneled IHD catheter NECK: No JVD; No carotid bruits LYMPHATICS: No lymphadenopathy CARDIAC: RRR, no murmurs, rubs, gallops RESPIRATORY:  Clear to auscultation without rales, wheezing or rhonchi  ABDOMEN: Soft, non-tender, non-distended MUSCULOSKELETAL:  trace to 1+ edema LE SKIN: Warm and dry NEUROLOGIC:  Alert and oriented x 3 PSYCHIATRIC:  Normal affect   ASSESSMENT:    DOE: Ddx includes ischemic dx (EKG suggestive of anterolateral ischemia) vs. may need more volume removed from IHD. No significant signs of decompensated CHF. Challenging at 52. Her hernia contributes as well. - will get a TTE, if WMA or EF decline will consider LHC with higher risk for ischemia - UF per IHD; can consider adjusting if echo is normal ;however her lungs were clear on recent xray - recommend management for anemia/sending FE/TIBC PLAN:    In order of problems listed above:  Iron panel, BNP TTE Follow up 6 months        Medication Adjustments/Labs and Tests Ordered: Current medicines are reviewed at length with the patient today.  Concerns regarding medicines are outlined above.  Orders Placed This Encounter  Procedures   Fe+TIBC+Fer   Brain natriuretic peptide   EKG 12-Lead   ECHOCARDIOGRAM COMPLETE   No orders of the defined types were placed in this encounter.   Patient Instructions  Medication Instructions:  No changes *If you need a refill on your cardiac medications before your next appointment, please call your pharmacy*   Lab Work: IRON panel and BNP If you have labs (blood work) drawn today and your tests are completely normal, you will receive your results only by: [place] (if you have  MyChart) OR A paper copy in the mail If you have any lab test that is abnormal or we need to change your treatment, we will call you to review the results.   Testing/Procedures: Your physician has requested that you have an echocardiogram. Echocardiography is a painless test that uses sound waves to create images of your heart. It provides your doctor with information about the size and shape of your heart and how well your heart's chambers and valves are working. This procedure takes approximately one hour. There are no restrictions for this procedure. Please do NOT wear cologne, perfume, aftershave, or lotions (deodorant is allowed). Please arrive 15 minutes prior to your appointment time.    Follow-Up: At [facility], you and your health needs are our priority.  As part of our continuing mission to provide you with exceptional heart care, we have created designated Provider Care Teams.  These Care Teams include your primary Cardiologist (physician) and Advanced Practice Providers (  APPs -  Physician Assistants and Nurse Practitioners) who all work together to provide you with the care you need, when you need it.  We recommend signing up for the patient portal called "MyChart".  Sign up information is provided on this After Visit Summary.  MyChart is used to connect with patients for Virtual Visits (Telemedicine).  Patients are able to view lab/test results, encounter notes, upcoming appointments, etc.  Non-urgent messages can be sent to your provider as well.   To learn more about what you can do with MyChart, go to [email or web address].    Your next appointment:   6 month(s)  Provider:   Dr Harl Bowie      Signed, Janina Mayo, MD  05/19/2022 4:20 PM    [place]

## 2022-05-19 NOTE — Patient Instructions (Signed)
Medication Instructions:  No changes *If you need a refill on your cardiac medications before your next appointment, please call your pharmacy*   Lab Work: IRON panel and BNP If you have labs (blood work) drawn today and your tests are completely normal, you will receive your results only by: [place] (if you have MyChart) OR A paper copy in the mail If you have any lab test that is abnormal or we need to change your treatment, we will call you to review the results.   Testing/Procedures: Your physician has requested that you have an echocardiogram. Echocardiography is a painless test that uses sound waves to create images of your heart. It provides your doctor with information about the size and shape of your heart and how well your heart's chambers and valves are working. This procedure takes approximately one hour. There are no restrictions for this procedure. Please do NOT wear cologne, perfume, aftershave, or lotions (deodorant is allowed). Please arrive 15 minutes prior to your appointment time.    Follow-Up: At [facility], you and your health needs are our priority.  As part of our continuing mission to provide you with exceptional heart care, we have created designated Provider Care Teams.  These Care Teams include your primary Cardiologist (physician) and Advanced Practice Providers (APPs -  Physician Assistants and Nurse Practitioners) who all work together to provide you with the care you need, when you need it.  We recommend signing up for the patient portal called "MyChart".  Sign up information is provided on this After Visit Summary.  MyChart is used to connect with patients for Virtual Visits (Telemedicine).  Patients are able to view lab/test results, encounter notes, upcoming appointments, etc.  Non-urgent messages can be sent to your provider as well.   To learn more about what you can do with MyChart, go to [email or web address].    Your next  appointment:   6 month(s)  Provider:   Dr Harl Bowie

## 2022-05-19 NOTE — Progress Notes (Addendum)
[facility] Note  05/24/2022     CHIEF COMPLAINT Patient presents for Retina Evaluation   HISTORY OF PRESENT ILLNESS: Rachel Hobbs is a 82 y.o. female who presents to the clinic today for:   HPI     Retina Evaluation   In both eyes.  This started 10 months ago.  Duration of 10 months.  Associated Symptoms Flashes and Floaters.  Context:  distance vision.  I, the attending physician,  performed the HPI with the patient and updated documentation appropriately.        Comments   Retina eval NPDR OU pt has been seeing Dr Zadie Rhine with her last appointment in April 2023 she is reporting her vision is not as sharp and she has noticed a light in both eyes at times  she has also had floaters her last reading was 130 range last night A1C 25 April 2022      Last edited by Bernarda Caffey, MD on 05/24/2022  5:45 PM.    Pt is a Dr. Zadie Rhine pt here due to insurance reasons, pt last saw him in April 2023, she was not receiving any treatment from him, Dr. Zadie Rhine recommend pt see Dr. Katy Fitch, but she does not have an appt yet, pts last A1c was 7.5 on 09.20.23  Referring physician: [facility] [address] [place],  [place] [address]  HISTORICAL INFORMATION:   Selected notes from the MEDICAL RECORD NUMBER Former Dr. Zadie Rhine pt -- transferring care due to insurance LEE:  Ocular Hx- PMH-    CURRENT MEDICATIONS: No current outpatient medications on file. (Ophthalmic Drugs)   No current facility-administered medications for this visit. (Ophthalmic Drugs)   Current Outpatient Medications (Other)  Medication Sig   aspirin 81 MG tablet Take 81 mg by mouth every other day.   atorvastatin (LIPITOR) 40 MG tablet TAKE 1 TABLET(40 MG) BY MOUTH DAILY AT 6 PM Strength: 40 mg   Blood Glucose Monitoring Suppl (ONETOUCH VERIO) w/Device KIT Use daily as indicated   calcium acetate (PHOSLO) 667 MG capsule Take 667-1,334 mg by mouth See admin instructions.  Take 1334 mg with each meal and 667 mg with each snack   clopidogrel (PLAVIX) 75 MG tablet Take 1 tablet (75 mg total) by mouth daily.   gabapentin (NEURONTIN) 100 MG capsule Take 100 mg by mouth 3 (three) times daily.   glucose blood (ONETOUCH VERIO) test strip Use as instructed   Insulin Pen Needle 31G X 8 MM MISC BD UltraFine III Pen Needles. For use with insulin pen device. Inject insulin 2X daily   isosorbide mononitrate (IMDUR) 30 MG 24 hr tablet Take 30 mg by mouth daily.   Lancets (ONETOUCH ULTRASOFT) lancets Once daily testing plus prn for hypoglycemia   LANTUS SOLOSTAR 100 UNIT/ML Solostar Pen Inject 42 Units into the skin at bedtime. PLEASE MAKE AN APPOINTMENT TO SEE PCP   naloxone (NARCAN) nasal spray 4 mg/0.1 mL Use in both nostrils if signs of opioid OD   NEEDLE, DISP, 30 G (B-D DISP NEEDLE 30GX1") 30G X 1" MISC 1 each by Does not apply route daily.   OneTouch Delica Lancets 51V MISC Use daily as indicated   oxyCODONE-acetaminophen (PERCOCET) 5-325 MG tablet Take 1 tablet by mouth every 6 (six) hours as needed for severe pain.   Current Facility-Administered Medications (Other)  Medication Route   oxymetazoline (AFRIN) 0.05 % nasal spray 1 spray Each Nare   REVIEW OF SYSTEMS: ROS  Positive for: Eyes Last edited by Bernarda Caffey, MD on 05/24/2022  5:46 PM.     ALLERGIES No Known Allergies  PAST MEDICAL HISTORY Past Medical History:  Diagnosis Date   Anemia of renal disease 06/16/2006   Qualifier: Diagnosis of  By: Marinell Blight, Dawn     Arthritis    CHF (congestive heart failure) ([place])    COVID 2021   Depression    Diabetes mellitus    type 2   Hernia    Hyperlipidemia    Hypertension    Iron deficiency anemia    LEG EDEMA, BILATERAL 12/03/2008   Qualifier: Diagnosis of  By: Carlena Sax  MD, Colletta [place]     Low iron    Nodule of soft tissue 06/22/2012   Peripheral vascular disease ([place])    in legs   Pneumonia    teenager   Renal disorder    CKD - dialysis T/TH/Sa    Shortness of breath dyspnea    with exertion   SMALL BOWEL OBSTRUCTION, HX OF 02/15/2007   Qualifier: Diagnosis of  By: [name], HEIDI     Type 2 diabetes mellitus with diabetic peripheral angiopathy without gangrene ([place]) 12/16/2015   Past Surgical History:  Procedure Laterality Date   ABDOMINAL HYSTERECTOMY     in the 43's   AORTIC ARCH ANGIOGRAPHY N/A 12/14/2021   Procedure: AORTIC ARCH ANGIOGRAPHY;  Surgeon: [name], MD;  Location: [facility];  Service: Cardiovascular;  Laterality: N/A;   AV FISTULA PLACEMENT Left 12/05/2015   Procedure: RADIOCEPHALIC VERSUS BRACHIOCEPHALIC ARTERIOVENOUS (AV) FISTULA CREATION;  Surgeon: [name], MD;  Location: [place] [facility];  Service: Vascular;  Laterality: Left;   AV FISTULA PLACEMENT Left 05/20/2017   Procedure: INSERTION OF ARTERIOVENOUS (AV) GORE-TEX VASCULAR STRETCH 4-7 GRAFT ARM LEFT UPPER ARM;  Surgeon: [name], MD;  Location: [place];  Service: Vascular;  Laterality: Left;   AV FISTULA PLACEMENT Left 08/14/2021   Procedure: LEFT ARM ARTERIOVENOUS (AV) FISTULA CREATION;  Surgeon: [name], MD;  Location: [facility];  Service: Vascular;  Laterality: Left;   [place] Left 06/04/2016   Procedure: FIRST STAGE BASILIC VEIN TRANSPOSITION;  Surgeon: [name], MD;  Location: [facility];  Service: Vascular;  Laterality: Left;   [place] Left 08/04/2016   Procedure: LEFT UPPER ARM [place], SECOND STAGE;  Surgeon: [name], MD;  Location: [place];  Service: Vascular;  Laterality: Left;   HERNIA REPAIR     umbilical in the 57'Q   INSERTION OF DIALYSIS CATHETER Right 12/05/2015   Procedure: INSERTION OF DIALYSIS CATHETER;  Surgeon: [name], MD;  Location: [facility];  Service: Vascular;  Laterality: Right;   IR THROMBECTOMY AV FISTULA W/THROMBOLYSIS/PTA INC/SHUNT/IMG LEFT Left 05/09/2019   IR US GUIDE VASC ACCESS LEFT  05/09/2019   LIGATION OF  COMPETING BRANCHES OF ARTERIOVENOUS FISTULA Left 09/04/2021   Procedure: LIGATION OF LEFT ARM CEPHALIC VEIN;  Surgeon: [name], MD;  Location: [facility];  Service: Vascular;  Laterality: Left;   PERIPHERAL VASCULAR BALLOON ANGIOPLASTY  12/14/2021   Procedure: PERIPHERAL VASCULAR BALLOON ANGIOPLASTY;  Surgeon: [name], MD;  Location: [facility];  Service: Cardiovascular;;   UPPER EXTREMITY ANGIOGRAPHY Left 12/14/2021   Procedure: Upper Extremity Angiography;  Surgeon: [name], MD;  Location: [facility];  Service: Cardiovascular;  Laterality: Left;   UPPER EXTREMITY VENOGRAPHY Bilateral 06/15/2021   Procedure: UPPER EXTREMITY  VENOGRAPHY;  Surgeon: [name], MD;  Location: [facility];  Service: Cardiovascular;  Laterality: Bilateral;   FAMILY HISTORY Family History  Problem Relation Age of Onset   Kidney disease Mother    Hypertension Mother    COPD Father        smoke   Cancer Father        Lung   Hypertension Father    Kidney disease Sister    Diabetes Daughter    SOCIAL HISTORY Social History   Tobacco Use   Smoking status: Never    Passive exposure: Never   Smokeless tobacco: Never  Vaping Use   Vaping Use: Never used  Substance Use Topics   Alcohol use: No   Drug use: No       OPHTHALMIC EXAM:  Base Eye Exam     Visual Acuity (Snellen - Linear)       Right Left   Dist [place] 20/50 20/50 -1   Dist ph [place] 20/30 -3 20/40 -1         Tonometry (Tonopen, 2:14 PM)       Right Left   Pressure 16 18         Pupils       Pupils Dark Light Shape React APD   Right PERRL 3 2 Round Sluggish None   Left PERRL 3 2 Round Sluggish None         Extraocular Movement       Right Left    Full, Ortho Full, Ortho         Neuro/Psych     Oriented x3: Yes   Mood/Affect: Normal         Dilation     Both eyes: 2.5% Phenylephrine @ 2:14 PM           Slit Lamp and Fundus  Exam     Slit Lamp Exam       Right Left   Lids/Lashes Dermatochalasis - upper lid Dermatochalasis - upper lid   Conjunctiva/Sclera Melanosis, nasal pingeucula Melanosis, temporal pinguecula   Cornea mild arcus, mild tear film debris mild arcus, mild tear film debris   Anterior Chamber deep, clear, narrow temporal angle deep, clear, narrow temporal angle   Iris Round and dilated, No NVI Round and dilated, No NVI   Lens 3+ Nuclear sclerosis, 3+ Cortical cataract 3+ Nuclear sclerosis, 3+ Cortical cataract   Anterior Vitreous mild syneresis mild syneresis         Fundus Exam       Right Left   Disc Pink and Sharp, temporal PPA Pink and Sharp, temporal PPA   C/D Ratio 0.3 0.3   Macula Flat, Good foveal reflex, RPE mottling, No heme or edema, no exudates Flat, Good foveal reflex, scattered MA, no exudates, no edema   Vessels attenuated, Tortuous attenuated, Tortuous   Periphery Attached, rare MA Attached, rare MA           IMAGING AND PROCEDURES  Imaging and Procedures for 05/24/2022  OCT, Retina - OU - Both Eyes       Right Eye Quality was good. Scan locations included subfoveal. Central Foveal Thickness: 268. Progression has no prior data. Findings include normal foveal contour, no IRF, no SRF.   Left Eye Quality was good. Scan locations included subfoveal. Central Foveal Thickness: 290. Progression has no prior data. Findings include normal foveal contour, no IRF, no SRF, intraretinal hyper-reflective material.   Notes *Images captured and stored on drive  Diagnosis / Impression:  NFP; no IRF/SRF OU No DME OU  Clinical management:  See below  Abbreviations: NFP - Normal foveal profile. CME - cystoid macular edema. PED - pigment epithelial detachment. IRF - intraretinal fluid. SRF - subretinal fluid. EZ - ellipsoid zone. ERM - epiretinal membrane. ORA - outer retinal atrophy. ORT - outer retinal tubulation. SRHM - subretinal hyper-reflective material. IRHM -  intraretinal hyper-reflective material            ASSESSMENT/PLAN:    ICD-10-CM   1. Mild nonproliferative diabetic retinopathy of both eyes without macular edema associated with type 2 diabetes mellitus (HCC)  B28.4132 OCT, Retina - OU - Both Eyes    2. Essential hypertension  I10     3. Hypertensive retinopathy of both eyes  H35.033     4. Combined forms of age-related cataract of both eyes  H25.813       Mild nonproliferative diabetic retinopathy w/o DME, both eyes  - last A1c 7.5 on 09.08.23 - The incidence, risk factors for progression, natural history and treatment options for diabetic retinopathy were discussed with patient.   - The need for close monitoring of blood glucose, blood pressure, and serum lipids, avoiding cigarette or any type of tobacco, and the need for long term follow up was also discussed with patient  - exam shows scattered MA OU (OS > OD) - OCT without diabetic macular edema, both eyes  - f/u in 6-9 mos -- DFE/OCT  2,3. Hypertensive retinopathy OU - discussed importance of tight BP control - monitor  4. Mixed Cataract OU - The symptoms of cataract, surgical options, and treatments and risks were discussed with patient. - discussed diagnosis and progression - likely visually significant - recommend referral back to [facility] for cataract follow up   Ophthalmic Meds Ordered this visit:  No orders of the defined types were placed in this encounter.    Return for f/u 6-9 months, NPDR OU, DFE, OCT.  There are no Patient Instructions on file for this visit.   Explained the diagnoses, plan, and follow up with the patient and they expressed understanding.  Patient expressed understanding of the importance of proper follow up care.   This document serves as a record of services personally performed by Gardiner Sleeper, MD, PhD. It was created on their behalf by Orvan Falconer, an ophthalmic technician. The creation of this record is the  provider's dictation and/or activities during the visit.    Electronically signed by: Orvan Falconer, OA, 05/24/22  6:09 PM  This document serves as a record of services personally performed by Gardiner Sleeper, MD, PhD. It was created on their behalf by San Jetty. Owens Shark, OA an ophthalmic technician. The creation of this record is the provider's dictation and/or activities during the visit.    Electronically signed by: San Jetty. Owens Shark, [place] 02.05.2024 6:09 PM  Gardiner Sleeper, M.D., Ph.D. Diseases & Surgery of the Retina and Vitreous [facility]  I have reviewed the above documentation for accuracy and completeness, and I agree with the above. Gardiner Sleeper, M.D., Ph.D. 05/24/22 6:09 PM  Abbreviations: M myopia (nearsighted); A astigmatism; H hyperopia (farsighted); P presbyopia; Mrx spectacle prescription;  CTL contact lenses; OD right eye; OS left eye; OU both eyes  XT exotropia; ET esotropia; PEK punctate epithelial keratitis; PEE punctate epithelial erosions; DES dry eye syndrome; MGD meibomian gland dysfunction; ATs artificial tears; PFAT's preservative free artificial tears; [place] nuclear sclerotic cataract;  PSC posterior subcapsular cataract; ERM epi-retinal membrane; PVD posterior vitreous detachment; RD retinal detachment; DM diabetes mellitus; DR diabetic retinopathy; NPDR non-proliferative diabetic retinopathy; PDR proliferative diabetic retinopathy; CSME clinically significant macular edema; DME diabetic macular edema; dbh dot blot hemorrhages; CWS cotton wool spot; POAG primary open angle glaucoma; C/D cup-to-disc ratio; HVF humphrey visual field; GVF goldmann visual field; OCT optical coherence tomography; IOP intraocular pressure; BRVO Branch retinal vein occlusion; CRVO central retinal vein occlusion; CRAO central retinal artery occlusion; BRAO branch retinal artery occlusion; RT retinal tear; SB scleral buckle; PPV pars plana vitrectomy; VH Vitreous hemorrhage;  PRP panretinal laser photocoagulation; IVK intravitreal kenalog; VMT vitreomacular traction; MH Macular hole;  NVD neovascularization of the disc; NVE neovascularization elsewhere; AREDS age related eye disease study; ARMD age related macular degeneration; POAG primary open angle glaucoma; EBMD epithelial/anterior basement membrane dystrophy; ACIOL anterior chamber intraocular lens; IOL intraocular lens; PCIOL posterior chamber intraocular lens; Phaco/IOL phacoemulsification with intraocular lens placement; [place] photorefractive keratectomy; LASIK laser assisted in situ keratomileusis; HTN hypertension; DM diabetes mellitus; COPD chronic obstructive pulmonary disease

## 2022-05-20 ENCOUNTER — Encounter: Payer: Self-pay | Admitting: *Deleted

## 2022-05-20 DIAGNOSIS — E1129 Type 2 diabetes mellitus with other diabetic kidney complication: Secondary | ICD-10-CM | POA: Diagnosis not present

## 2022-05-20 DIAGNOSIS — N2581 Secondary hyperparathyroidism of renal origin: Secondary | ICD-10-CM | POA: Diagnosis not present

## 2022-05-20 DIAGNOSIS — R52 Pain, unspecified: Secondary | ICD-10-CM | POA: Diagnosis not present

## 2022-05-20 DIAGNOSIS — R519 Headache, unspecified: Secondary | ICD-10-CM | POA: Diagnosis not present

## 2022-05-20 DIAGNOSIS — D688 Other specified coagulation defects: Secondary | ICD-10-CM | POA: Diagnosis not present

## 2022-05-20 DIAGNOSIS — Z992 Dependence on renal dialysis: Secondary | ICD-10-CM | POA: Diagnosis not present

## 2022-05-20 DIAGNOSIS — N186 End stage renal disease: Secondary | ICD-10-CM | POA: Diagnosis not present

## 2022-05-20 LAB — IRON,TIBC AND FERRITIN PANEL
Ferritin: 1009 ng/mL — ABNORMAL HIGH (ref 15–150)
Iron Saturation: 35 % (ref 15–55)
Iron: 60 ug/dL (ref 27–139)
Total Iron Binding Capacity: 171 ug/dL — ABNORMAL LOW (ref 250–450)
UIBC: 111 ug/dL — ABNORMAL LOW (ref 118–369)

## 2022-05-20 LAB — BRAIN NATRIURETIC PEPTIDE: BNP: 372.1 pg/mL — ABNORMAL HIGH (ref 0.0–100.0)

## 2022-05-22 DIAGNOSIS — Z992 Dependence on renal dialysis: Secondary | ICD-10-CM | POA: Diagnosis not present

## 2022-05-22 DIAGNOSIS — N2581 Secondary hyperparathyroidism of renal origin: Secondary | ICD-10-CM | POA: Diagnosis not present

## 2022-05-22 DIAGNOSIS — N186 End stage renal disease: Secondary | ICD-10-CM | POA: Diagnosis not present

## 2022-05-22 DIAGNOSIS — R519 Headache, unspecified: Secondary | ICD-10-CM | POA: Diagnosis not present

## 2022-05-22 DIAGNOSIS — R52 Pain, unspecified: Secondary | ICD-10-CM | POA: Diagnosis not present

## 2022-05-22 DIAGNOSIS — D688 Other specified coagulation defects: Secondary | ICD-10-CM | POA: Diagnosis not present

## 2022-05-24 ENCOUNTER — Encounter (INDEPENDENT_AMBULATORY_CARE_PROVIDER_SITE_OTHER): Payer: Self-pay | Admitting: Ophthalmology

## 2022-05-24 ENCOUNTER — Ambulatory Visit (INDEPENDENT_AMBULATORY_CARE_PROVIDER_SITE_OTHER): Payer: Medicare Other | Admitting: Ophthalmology

## 2022-05-24 DIAGNOSIS — H35033 Hypertensive retinopathy, bilateral: Secondary | ICD-10-CM

## 2022-05-24 DIAGNOSIS — I10 Essential (primary) hypertension: Secondary | ICD-10-CM

## 2022-05-24 DIAGNOSIS — E113293 Type 2 diabetes mellitus with mild nonproliferative diabetic retinopathy without macular edema, bilateral: Secondary | ICD-10-CM | POA: Diagnosis not present

## 2022-05-24 DIAGNOSIS — H25813 Combined forms of age-related cataract, bilateral: Secondary | ICD-10-CM | POA: Diagnosis not present

## 2022-05-24 DIAGNOSIS — E113393 Type 2 diabetes mellitus with moderate nonproliferative diabetic retinopathy without macular edema, bilateral: Secondary | ICD-10-CM

## 2022-05-25 DIAGNOSIS — R519 Headache, unspecified: Secondary | ICD-10-CM | POA: Diagnosis not present

## 2022-05-25 DIAGNOSIS — D688 Other specified coagulation defects: Secondary | ICD-10-CM | POA: Diagnosis not present

## 2022-05-25 DIAGNOSIS — N2581 Secondary hyperparathyroidism of renal origin: Secondary | ICD-10-CM | POA: Diagnosis not present

## 2022-05-25 DIAGNOSIS — Z992 Dependence on renal dialysis: Secondary | ICD-10-CM | POA: Diagnosis not present

## 2022-05-25 DIAGNOSIS — N186 End stage renal disease: Secondary | ICD-10-CM | POA: Diagnosis not present

## 2022-05-25 DIAGNOSIS — R52 Pain, unspecified: Secondary | ICD-10-CM | POA: Diagnosis not present

## 2022-05-27 DIAGNOSIS — R52 Pain, unspecified: Secondary | ICD-10-CM | POA: Diagnosis not present

## 2022-05-27 DIAGNOSIS — N2581 Secondary hyperparathyroidism of renal origin: Secondary | ICD-10-CM | POA: Diagnosis not present

## 2022-05-27 DIAGNOSIS — R519 Headache, unspecified: Secondary | ICD-10-CM | POA: Diagnosis not present

## 2022-05-27 DIAGNOSIS — D688 Other specified coagulation defects: Secondary | ICD-10-CM | POA: Diagnosis not present

## 2022-05-27 DIAGNOSIS — Z992 Dependence on renal dialysis: Secondary | ICD-10-CM | POA: Diagnosis not present

## 2022-05-27 DIAGNOSIS — N186 End stage renal disease: Secondary | ICD-10-CM | POA: Diagnosis not present

## 2022-05-29 DIAGNOSIS — R52 Pain, unspecified: Secondary | ICD-10-CM | POA: Diagnosis not present

## 2022-05-29 DIAGNOSIS — R519 Headache, unspecified: Secondary | ICD-10-CM | POA: Diagnosis not present

## 2022-05-29 DIAGNOSIS — N2581 Secondary hyperparathyroidism of renal origin: Secondary | ICD-10-CM | POA: Diagnosis not present

## 2022-05-29 DIAGNOSIS — N186 End stage renal disease: Secondary | ICD-10-CM | POA: Diagnosis not present

## 2022-05-29 DIAGNOSIS — Z992 Dependence on renal dialysis: Secondary | ICD-10-CM | POA: Diagnosis not present

## 2022-05-29 DIAGNOSIS — D688 Other specified coagulation defects: Secondary | ICD-10-CM | POA: Diagnosis not present

## 2022-06-01 DIAGNOSIS — N186 End stage renal disease: Secondary | ICD-10-CM | POA: Diagnosis not present

## 2022-06-01 DIAGNOSIS — D688 Other specified coagulation defects: Secondary | ICD-10-CM | POA: Diagnosis not present

## 2022-06-01 DIAGNOSIS — N2581 Secondary hyperparathyroidism of renal origin: Secondary | ICD-10-CM | POA: Diagnosis not present

## 2022-06-01 DIAGNOSIS — R519 Headache, unspecified: Secondary | ICD-10-CM | POA: Diagnosis not present

## 2022-06-01 DIAGNOSIS — Z992 Dependence on renal dialysis: Secondary | ICD-10-CM | POA: Diagnosis not present

## 2022-06-01 DIAGNOSIS — R52 Pain, unspecified: Secondary | ICD-10-CM | POA: Diagnosis not present

## 2022-06-02 ENCOUNTER — Encounter (HOSPITAL_COMMUNITY): Payer: Self-pay

## 2022-06-02 ENCOUNTER — Other Ambulatory Visit: Payer: Self-pay | Admitting: *Deleted

## 2022-06-02 DIAGNOSIS — M79643 Pain in unspecified hand: Secondary | ICD-10-CM

## 2022-06-02 DIAGNOSIS — N186 End stage renal disease: Secondary | ICD-10-CM

## 2022-06-03 DIAGNOSIS — N186 End stage renal disease: Secondary | ICD-10-CM | POA: Diagnosis not present

## 2022-06-03 DIAGNOSIS — D688 Other specified coagulation defects: Secondary | ICD-10-CM | POA: Diagnosis not present

## 2022-06-03 DIAGNOSIS — R519 Headache, unspecified: Secondary | ICD-10-CM | POA: Diagnosis not present

## 2022-06-03 DIAGNOSIS — Z992 Dependence on renal dialysis: Secondary | ICD-10-CM | POA: Diagnosis not present

## 2022-06-03 DIAGNOSIS — R52 Pain, unspecified: Secondary | ICD-10-CM | POA: Diagnosis not present

## 2022-06-03 DIAGNOSIS — N2581 Secondary hyperparathyroidism of renal origin: Secondary | ICD-10-CM | POA: Diagnosis not present

## 2022-06-05 DIAGNOSIS — R52 Pain, unspecified: Secondary | ICD-10-CM | POA: Diagnosis not present

## 2022-06-05 DIAGNOSIS — D688 Other specified coagulation defects: Secondary | ICD-10-CM | POA: Diagnosis not present

## 2022-06-05 DIAGNOSIS — Z992 Dependence on renal dialysis: Secondary | ICD-10-CM | POA: Diagnosis not present

## 2022-06-05 DIAGNOSIS — N186 End stage renal disease: Secondary | ICD-10-CM | POA: Diagnosis not present

## 2022-06-05 DIAGNOSIS — R519 Headache, unspecified: Secondary | ICD-10-CM | POA: Diagnosis not present

## 2022-06-05 DIAGNOSIS — N2581 Secondary hyperparathyroidism of renal origin: Secondary | ICD-10-CM | POA: Diagnosis not present

## 2022-06-07 ENCOUNTER — Telehealth: Payer: Self-pay

## 2022-06-07 NOTE — Telephone Encounter (Signed)
Pt called to request a change in doctors. She has an appt with Dr. Donzetta Matters on 2/21, but stated that it was her right to change if she wanted. She mentioned Dr. Scot Dock.  Reviewed pt's chart, returned call for clarification, no answer, lf vm.

## 2022-06-08 ENCOUNTER — Inpatient Hospital Stay (HOSPITAL_COMMUNITY)
Admission: EM | Admit: 2022-06-08 | Discharge: 2022-06-18 | DRG: 325 | Disposition: E | Payer: Medicare HMO | Attending: Internal Medicine | Admitting: Internal Medicine

## 2022-06-08 ENCOUNTER — Encounter (HOSPITAL_COMMUNITY): Payer: Self-pay

## 2022-06-08 ENCOUNTER — Emergency Department (HOSPITAL_COMMUNITY): Payer: Medicare HMO

## 2022-06-08 DIAGNOSIS — M542 Cervicalgia: Secondary | ICD-10-CM | POA: Diagnosis not present

## 2022-06-08 DIAGNOSIS — E871 Hypo-osmolality and hyponatremia: Secondary | ICD-10-CM | POA: Diagnosis present

## 2022-06-08 DIAGNOSIS — Z809 Family history of malignant neoplasm, unspecified: Secondary | ICD-10-CM

## 2022-06-08 DIAGNOSIS — I10 Essential (primary) hypertension: Secondary | ICD-10-CM | POA: Diagnosis not present

## 2022-06-08 DIAGNOSIS — I132 Hypertensive heart and chronic kidney disease with heart failure and with stage 5 chronic kidney disease, or end stage renal disease: Secondary | ICD-10-CM | POA: Diagnosis present

## 2022-06-08 DIAGNOSIS — K439 Ventral hernia without obstruction or gangrene: Secondary | ICD-10-CM | POA: Diagnosis present

## 2022-06-08 DIAGNOSIS — Z7982 Long term (current) use of aspirin: Secondary | ICD-10-CM

## 2022-06-08 DIAGNOSIS — R778 Other specified abnormalities of plasma proteins: Secondary | ICD-10-CM | POA: Diagnosis not present

## 2022-06-08 DIAGNOSIS — Z992 Dependence on renal dialysis: Secondary | ICD-10-CM | POA: Diagnosis not present

## 2022-06-08 DIAGNOSIS — R519 Headache, unspecified: Secondary | ICD-10-CM | POA: Diagnosis not present

## 2022-06-08 DIAGNOSIS — N186 End stage renal disease: Secondary | ICD-10-CM | POA: Diagnosis not present

## 2022-06-08 DIAGNOSIS — E782 Mixed hyperlipidemia: Secondary | ICD-10-CM | POA: Diagnosis not present

## 2022-06-08 DIAGNOSIS — I4901 Ventricular fibrillation: Principal | ICD-10-CM | POA: Diagnosis present

## 2022-06-08 DIAGNOSIS — M19012 Primary osteoarthritis, left shoulder: Secondary | ICD-10-CM | POA: Diagnosis not present

## 2022-06-08 DIAGNOSIS — J9601 Acute respiratory failure with hypoxia: Secondary | ICD-10-CM | POA: Diagnosis present

## 2022-06-08 DIAGNOSIS — I462 Cardiac arrest due to underlying cardiac condition: Secondary | ICD-10-CM | POA: Diagnosis not present

## 2022-06-08 DIAGNOSIS — I2510 Atherosclerotic heart disease of native coronary artery without angina pectoris: Secondary | ICD-10-CM | POA: Diagnosis present

## 2022-06-08 DIAGNOSIS — Z7902 Long term (current) use of antithrombotics/antiplatelets: Secondary | ICD-10-CM

## 2022-06-08 DIAGNOSIS — R570 Cardiogenic shock: Secondary | ICD-10-CM | POA: Diagnosis not present

## 2022-06-08 DIAGNOSIS — R7989 Other specified abnormal findings of blood chemistry: Secondary | ICD-10-CM | POA: Diagnosis not present

## 2022-06-08 DIAGNOSIS — I440 Atrioventricular block, first degree: Secondary | ICD-10-CM | POA: Diagnosis not present

## 2022-06-08 DIAGNOSIS — E1151 Type 2 diabetes mellitus with diabetic peripheral angiopathy without gangrene: Secondary | ICD-10-CM | POA: Diagnosis present

## 2022-06-08 DIAGNOSIS — Z841 Family history of disorders of kidney and ureter: Secondary | ICD-10-CM

## 2022-06-08 DIAGNOSIS — I2584 Coronary atherosclerosis due to calcified coronary lesion: Secondary | ICD-10-CM

## 2022-06-08 DIAGNOSIS — M25512 Pain in left shoulder: Secondary | ICD-10-CM | POA: Diagnosis present

## 2022-06-08 DIAGNOSIS — M810 Age-related osteoporosis without current pathological fracture: Secondary | ICD-10-CM | POA: Diagnosis present

## 2022-06-08 DIAGNOSIS — M1990 Unspecified osteoarthritis, unspecified site: Secondary | ICD-10-CM | POA: Diagnosis present

## 2022-06-08 DIAGNOSIS — R079 Chest pain, unspecified: Secondary | ICD-10-CM | POA: Diagnosis not present

## 2022-06-08 DIAGNOSIS — D688 Other specified coagulation defects: Secondary | ICD-10-CM | POA: Diagnosis not present

## 2022-06-08 DIAGNOSIS — E1140 Type 2 diabetes mellitus with diabetic neuropathy, unspecified: Secondary | ICD-10-CM | POA: Diagnosis present

## 2022-06-08 DIAGNOSIS — Z825 Family history of asthma and other chronic lower respiratory diseases: Secondary | ICD-10-CM

## 2022-06-08 DIAGNOSIS — R34 Anuria and oliguria: Secondary | ICD-10-CM | POA: Diagnosis present

## 2022-06-08 DIAGNOSIS — I152 Hypertension secondary to endocrine disorders: Secondary | ICD-10-CM | POA: Diagnosis not present

## 2022-06-08 DIAGNOSIS — Z66 Do not resuscitate: Secondary | ICD-10-CM | POA: Diagnosis present

## 2022-06-08 DIAGNOSIS — E1159 Type 2 diabetes mellitus with other circulatory complications: Secondary | ICD-10-CM | POA: Diagnosis not present

## 2022-06-08 DIAGNOSIS — G8929 Other chronic pain: Secondary | ICD-10-CM | POA: Diagnosis present

## 2022-06-08 DIAGNOSIS — Z833 Family history of diabetes mellitus: Secondary | ICD-10-CM

## 2022-06-08 DIAGNOSIS — Z79899 Other long term (current) drug therapy: Secondary | ICD-10-CM

## 2022-06-08 DIAGNOSIS — E8721 Acute metabolic acidosis: Secondary | ICD-10-CM | POA: Diagnosis present

## 2022-06-08 DIAGNOSIS — Z8616 Personal history of COVID-19: Secondary | ICD-10-CM

## 2022-06-08 DIAGNOSIS — E875 Hyperkalemia: Secondary | ICD-10-CM | POA: Diagnosis present

## 2022-06-08 DIAGNOSIS — Z794 Long term (current) use of insulin: Secondary | ICD-10-CM

## 2022-06-08 DIAGNOSIS — Z6838 Body mass index (BMI) 38.0-38.9, adult: Secondary | ICD-10-CM

## 2022-06-08 DIAGNOSIS — J9602 Acute respiratory failure with hypercapnia: Secondary | ICD-10-CM | POA: Diagnosis present

## 2022-06-08 DIAGNOSIS — I700 Atherosclerosis of aorta: Secondary | ICD-10-CM | POA: Diagnosis not present

## 2022-06-08 DIAGNOSIS — E118 Type 2 diabetes mellitus with unspecified complications: Secondary | ICD-10-CM | POA: Diagnosis present

## 2022-06-08 DIAGNOSIS — F419 Anxiety disorder, unspecified: Secondary | ICD-10-CM | POA: Diagnosis present

## 2022-06-08 DIAGNOSIS — N2581 Secondary hyperparathyroidism of renal origin: Secondary | ICD-10-CM | POA: Diagnosis not present

## 2022-06-08 DIAGNOSIS — Z1152 Encounter for screening for COVID-19: Secondary | ICD-10-CM

## 2022-06-08 DIAGNOSIS — I739 Peripheral vascular disease, unspecified: Secondary | ICD-10-CM

## 2022-06-08 DIAGNOSIS — E1122 Type 2 diabetes mellitus with diabetic chronic kidney disease: Secondary | ICD-10-CM | POA: Diagnosis present

## 2022-06-08 DIAGNOSIS — I469 Cardiac arrest, cause unspecified: Secondary | ICD-10-CM

## 2022-06-08 DIAGNOSIS — R0602 Shortness of breath: Secondary | ICD-10-CM | POA: Diagnosis present

## 2022-06-08 DIAGNOSIS — I493 Ventricular premature depolarization: Secondary | ICD-10-CM | POA: Diagnosis not present

## 2022-06-08 DIAGNOSIS — R52 Pain, unspecified: Secondary | ICD-10-CM | POA: Diagnosis not present

## 2022-06-08 DIAGNOSIS — I5023 Acute on chronic systolic (congestive) heart failure: Secondary | ICD-10-CM | POA: Diagnosis present

## 2022-06-08 DIAGNOSIS — E785 Hyperlipidemia, unspecified: Secondary | ICD-10-CM | POA: Diagnosis present

## 2022-06-08 DIAGNOSIS — Z8249 Family history of ischemic heart disease and other diseases of the circulatory system: Secondary | ICD-10-CM

## 2022-06-08 DIAGNOSIS — I447 Left bundle-branch block, unspecified: Secondary | ICD-10-CM | POA: Diagnosis present

## 2022-06-08 DIAGNOSIS — R680 Hypothermia, not associated with low environmental temperature: Secondary | ICD-10-CM | POA: Diagnosis not present

## 2022-06-08 DIAGNOSIS — Z743 Need for continuous supervision: Secondary | ICD-10-CM | POA: Diagnosis not present

## 2022-06-08 DIAGNOSIS — M25519 Pain in unspecified shoulder: Secondary | ICD-10-CM | POA: Diagnosis not present

## 2022-06-08 DIAGNOSIS — E1165 Type 2 diabetes mellitus with hyperglycemia: Secondary | ICD-10-CM | POA: Diagnosis not present

## 2022-06-08 DIAGNOSIS — D72829 Elevated white blood cell count, unspecified: Secondary | ICD-10-CM | POA: Diagnosis present

## 2022-06-08 LAB — BASIC METABOLIC PANEL
Anion gap: 13 (ref 5–15)
BUN: 28 mg/dL — ABNORMAL HIGH (ref 8–23)
CO2: 28 mmol/L (ref 22–32)
Calcium: 8.7 mg/dL — ABNORMAL LOW (ref 8.9–10.3)
Chloride: 93 mmol/L — ABNORMAL LOW (ref 98–111)
Creatinine, Ser: 6.29 mg/dL — ABNORMAL HIGH (ref 0.44–1.00)
GFR, Estimated: 6 mL/min — ABNORMAL LOW (ref >60)
Glucose, Bld: 98 mg/dL (ref 70–99)
Potassium: 4.5 mmol/L (ref 3.5–5.1)
Sodium: 134 mmol/L — ABNORMAL LOW (ref 135–145)

## 2022-06-08 LAB — CBC
HCT: 38.9 % (ref 36.0–46.0)
Hemoglobin: 12.9 g/dL (ref 12.0–15.0)
MCH: 35.1 pg — ABNORMAL HIGH (ref 26.0–34.0)
MCHC: 33.2 g/dL (ref 30.0–36.0)
MCV: 106 fL — ABNORMAL HIGH (ref 80.0–100.0)
Platelets: 157 10*3/uL (ref 150–400)
RBC: 3.67 MIL/uL — ABNORMAL LOW (ref 3.87–5.11)
RDW: 15 % (ref 11.5–15.5)
WBC: 6.4 10*3/uL (ref 4.0–10.5)
nRBC: 0 % (ref 0.0–0.2)

## 2022-06-08 LAB — TROPONIN I (HIGH SENSITIVITY)
Troponin I (High Sensitivity): 373 ng/L (ref <18)
Troponin I (High Sensitivity): 388 ng/L (ref <18)

## 2022-06-08 LAB — CBG MONITORING, ED: Glucose-Capillary: 100 mg/dL — ABNORMAL HIGH (ref 70–99)

## 2022-06-08 MED ORDER — HYDROMORPHONE HCL 1 MG/ML IJ SOLN
0.5000 mg | INTRAMUSCULAR | Status: AC | PRN
  Administered 2022-06-08 (×2): 0.5 mg via INTRAVENOUS
  Filled 2022-06-08 (×2): qty 1

## 2022-06-08 MED ORDER — DICLOFENAC SODIUM 1 % EX GEL
2.0000 g | Freq: Four times a day (QID) | CUTANEOUS | Status: DC
  Filled 2022-06-08 (×2): qty 100

## 2022-06-08 MED ORDER — LIDOCAINE 5 % EX PTCH
1.0000 | MEDICATED_PATCH | CUTANEOUS | Status: DC
  Administered 2022-06-08: 1 via TRANSDERMAL
  Filled 2022-06-08 (×2): qty 1

## 2022-06-08 MED ORDER — ISOSORBIDE MONONITRATE ER 30 MG PO TB24: 60.0000 mg | ORAL_TABLET | Freq: Every day | ORAL | Status: DC

## 2022-06-08 MED ORDER — ASPIRIN 81 MG PO CHEW: 81.0000 mg | CHEWABLE_TABLET | ORAL | Status: DC

## 2022-06-08 MED ORDER — HYDROMORPHONE HCL 1 MG/ML IJ SOLN: 1.0000 mg | Freq: Once | INTRAMUSCULAR | Status: DC

## 2022-06-08 MED ORDER — ATORVASTATIN CALCIUM 40 MG PO TABS
40.0000 mg | ORAL_TABLET | Freq: Every day | ORAL | Status: DC
  Administered 2022-06-09: 40 mg via ORAL
  Filled 2022-06-08: qty 1

## 2022-06-08 MED ORDER — CLOPIDOGREL BISULFATE 75 MG PO TABS
75.0000 mg | ORAL_TABLET | Freq: Every day | ORAL | Status: DC
  Administered 2022-06-09: 75 mg via ORAL
  Filled 2022-06-08: qty 1

## 2022-06-08 MED ORDER — HEPARIN SODIUM (PORCINE) 5000 UNIT/ML IJ SOLN
5000.0000 [IU] | Freq: Three times a day (TID) | INTRAMUSCULAR | Status: DC
  Administered 2022-06-08 – 2022-06-09 (×2): 5000 [IU] via SUBCUTANEOUS
  Filled 2022-06-08 (×2): qty 1

## 2022-06-08 MED ORDER — ACETAMINOPHEN 325 MG PO TABS
650.0000 mg | ORAL_TABLET | Freq: Four times a day (QID) | ORAL | Status: DC
  Administered 2022-06-08 – 2022-06-09 (×3): 650 mg via ORAL
  Filled 2022-06-08 (×3): qty 2

## 2022-06-08 NOTE — ED Provider Notes (Signed)
Seen after prior ED provider.  Notable results include initial troponin of 373.  Patient already known to Dr. Harl Bowie with cardiology.  Dr. Gasper Sells is aware of case and will consult.  Cardiology agrees with plan for medicine admission.  No indication for heparinization at this time.  Patient with known history of ESRD, hyperlipidemia, hypertension, CHF.  Hospitalist service made aware of case.      Valarie Merino, MD 06/16/2022 [phone]

## 2022-06-08 NOTE — Assessment & Plan Note (Addendum)
Differentials as above.  Denies trauma. 4/5 str with flexion/abduction of left shoulder. Pain currently controlled from Dilaudid 1 mg dose @1600$ . Recommend tylenol, voltaren gel, lidocain patch for pain.  -PT/OT eval and treat

## 2022-06-08 NOTE — ED Provider Notes (Signed)
[facility] Provider Note   CSN: [id number] Arrival date & time: 05/29/2022  1445     History  Chief Complaint  Patient presents with   Shoulder Pain    Rachel Hobbs is a 82 y.o. female.   Shoulder Pain    Patient has history of multiple medical problems including depression hyperlipidemia hypertension, CHF, chronic kidney disease on dialysis.  Patient has history of left AV fistula placement.  Patient also has prior surgical history of ligation of an AV fistula.  Patient presents to the ED today with complaints of worsening arm and shoulder pain.  Patient states the symptoms have been ongoing for a while ever since she had her surgical procedure back in August of last year but she is felt it worsening in the past week.  Patient states her fingers feel cold and tingling.  She is also having more severe very intense pain in her shoulder and neck.  She denies any chest pain.  No shortness of breath. Home Medications Prior to Admission medications   Medication Sig Start Date End Date Taking? Authorizing Provider  aspirin 81 MG tablet Take 81 mg by mouth every other day.    [provider]  atorvastatin (LIPITOR) 40 MG tablet TAKE 1 TABLET(40 MG) BY MOUTH DAILY AT 6 PM Strength: 40 mg 05/28/21   Lurline Del, DO  Blood Glucose Monitoring Suppl [facility] VERIO) w/Device KIT Use daily as indicated 12/25/21   Darci Current, DO  calcium acetate (PHOSLO) 667 MG capsule Take 667-1,334 mg by mouth See admin instructions. Take 1334 mg with each meal and 667 mg with each snack 05/10/16   [provider]  clopidogrel (PLAVIX) 75 MG tablet Take 1 tablet (75 mg total) by mouth daily. 12/14/21 12/14/22  [name], MD  gabapentin (NEURONTIN) 100 MG capsule Take 100 mg by mouth 3 (three) times daily.    [provider]  glucose blood (ONETOUCH VERIO) test strip Use as instructed 12/25/21   Darci Current, DO  Insulin Pen  Needle 31G X 8 MM MISC BD UltraFine III Pen Needles. For use with insulin pen device. Inject insulin 2X daily 12/25/21   Darci Current, DO  isosorbide mononitrate (IMDUR) 30 MG 24 hr tablet Take 30 mg by mouth daily. 03/29/21   [provider]  Lancets Glory Rosebush ULTRASOFT) lancets Once daily testing plus prn for hypoglycemia 12/25/21   Darci Current, DO  LANTUS SOLOSTAR 100 UNIT/ML Solostar Pen Inject 42 Units into the skin at bedtime. PLEASE MAKE AN APPOINTMENT TO SEE PCP 12/25/21   Darci Current, DO  naloxone [facility]) nasal spray 4 mg/0.1 mL Use in both nostrils if signs of opioid OD 09/07/21   Kommor, [place], MD  NEEDLE, DISP, 30 G (B-D DISP NEEDLE 30GX1") 30G X 1" MISC 1 each by Does not apply route daily. 12/25/21   Darci Current, DO  OneTouch Delica Lancets [id number] MISC Use daily as indicated 12/25/21   Darci Current, DO  oxyCODONE-acetaminophen (PERCOCET) 5-325 MG tablet Take 1 tablet by mouth every 6 (six) hours as needed for severe pain. 12/25/21 12/25/22  Darci Current, DO      Allergies    Patient has no known allergies.    Review of Systems   Review of Systems  Physical Exam Updated Vital Signs BP 112/68   Pulse 81   Temp (!) 97.5 F (36.4 C)   Resp (!) 23   Ht 1.575 m (5' 2"$ )   Wt  94.8 kg   SpO2 93%   BMI 38.23 kg/m  Physical Exam Vitals and nursing note reviewed.  Constitutional:      Appearance: She is well-developed. She is ill-appearing.  HENT:     Head: Normocephalic and atraumatic.     Right Ear: External ear normal.     Left Ear: External ear normal.     Nose:     Comments: Possible subcutaneous emphysema palpated in the neck region Eyes:     General: No scleral icterus.       Right eye: No discharge.        Left eye: No discharge.     Conjunctiva/sclera: Conjunctivae normal.  Neck:     Trachea: No tracheal deviation.  Cardiovascular:     Rate and Rhythm: Normal rate and regular rhythm.  Pulmonary:     Effort: Pulmonary effort is normal. No respiratory  distress.     Breath sounds: Normal breath sounds. No stridor. No wheezing or rales.  Abdominal:     General: Bowel sounds are normal. There is no distension.     Palpations: Abdomen is soft.     Tenderness: There is no abdominal tenderness. There is no guarding or rebound.  Musculoskeletal:        General: No tenderness or deformity.     Cervical back: Neck supple.     Comments: Pain with range of motion of the left shoulder, weak pulses palpated left radial, fingertips are cool to the touch  Skin:    General: Skin is warm and dry.     Findings: No rash.  Neurological:     General: No focal deficit present.     Mental Status: She is alert.     Cranial Nerves: No cranial nerve deficit, dysarthria or facial asymmetry.     Sensory: Sensory deficit present.     Motor: No abnormal muscle tone or seizure activity.     Coordination: Coordination normal.     ED Results / Procedures / Treatments   Labs (all labs ordered are listed, but only abnormal results are displayed) Labs Reviewed - No data to display  EKG EKG Interpretation  Date/Time:  Tuesday June 08 2022 14:51:57 EST Ventricular Rate:  95 PR Interval:  206 QRS Duration: 99 QT Interval:  393 QTC Calculation: 495 R Axis:   -51 Text Interpretation: Sinus rhythm Left anterior fascicular block Anterior infarct, old Nonspecific T abnormalities, lateral leads No significant change since last tracing Confirmed by [name] [phone]) on 05/29/2022 3:03:46 PM  Radiology No results found.  Procedures Procedures    Medications Ordered in ED Medications - No data to display  ED Course/ Medical Decision Making/ A&P Clinical Course as of 05/26/2022 1642  Tue Jun 08, 2022  1609 Case was discussed with Dr. Doren Custard.  He evaluated the patient in the ER.  Does not feel that she has an acute vascular occlusion in her left upper extremity.  We will continue with evaluation in the ED [JK]  1641 Chest x-ray shows stable cardiomegaly.  No  acute lung process. [id number] Shoulder x-ray shows degenerative changes in the [facility] joint and rotator cuff pathology, no acute bony abnormality [JK]    Clinical Course User Index [JK] [name], MD                             Medical Decision Making Amount and/or Complexity of Data Reviewed Labs: ordered. Radiology: ordered  and independent interpretation performed. Discussion of management or test interpretation with external provider(s): Case discussed with Dr Scot Dock who evaluate pt in the ED.  No acute vascular process.  Risk Prescription drug management.   Pt presented with acute shoulder pain.  Pt also having numbness in fingers.  Pt with chronic vacular disease but no acute issue noted by vascular surgery today.  Xray does show arthritic changes.  Possibly etiology for her symptoms.  Will check labs to evaluate for cardiac etiology.  Care turned over to Dr Francia Greaves at shift change        Final Clinical Impression(s) / ED Diagnoses Final diagnoses:  None    Rx / DC Orders ED Discharge Orders     None         [name], MD 05/31/2022 1644

## 2022-06-08 NOTE — Assessment & Plan Note (Signed)
Admitted for ACS r/o and observation. Cardiology initially believed troponinemia at 300s due to demand ischemia unlikely ACS as troponin elevations are normal in ESRD. However now troponin increased [phone]  with some mild hypotension, now with more concern for ACS. New LBBB on EKG. Lipid panel very normal. Last echo in 11/2020 LVEF of 66% and grade 1 diastolic dysfunction.  - Cardiology following, appreciate recs - Continue Hepatin gtt  - Pending echocardiogram - F/u ABG, lactic acid  - Cardiac telemetry - Continue aspirin 81 mg daily, plavix 75 mg, atorvastatin 40 mg daily - Hold imdur in the setting of soft blood pressures  - Try one dose of 0.5 mg dilaudid

## 2022-06-08 NOTE — ED Notes (Signed)
Pt remains sleeping

## 2022-06-08 NOTE — ED Triage Notes (Signed)
Pt arrives via EMS from home. Pt reports left shoulder pain that has been ongoing for the last few months. States the pain has worsened over the past couple of weeks. She states the pain is likely due to where they hit a nerve in her left arm when they attempted to gain fistula access last June. Reports chronic numbness as well to her left forearm. Pt is AxOx4. Reports completing a full session of dialysis today. Denies cp or sob. Wob is labored in triage.

## 2022-06-08 NOTE — ED Notes (Signed)
Sandwich and water provided to  the pt.  

## 2022-06-08 NOTE — Assessment & Plan Note (Signed)
A1C 7.1 six months ago. Takes 42 unites Lantus at night. Will adjust insulin as appropriate during admission. -20 Units lantus -CBG with meals

## 2022-06-08 NOTE — Consult Note (Signed)
ASSESSMENT & PLAN   LEFT SHOULDER PAIN: This patient has had a long history of left shoulder pain which has gotten worse.  Based on her x-ray findings below it sounds like she has some rotator cuff issues that might be contributing to her pain.  She also states that she has had chronic pain since her nerve block was placed in April of last year prior to her fistula.  I do not think her pain can be attributed to ischemia.  I have reviewed her arteriogram from August of last year.  She has a brisk radial and palmar arch signal with the Doppler on the left.  REASON FOR CONSULT:    Possible ischemic left upper extremity.  The consult is requested by Dr. Tomi Bamberger.  HPI:   Rachel Hobbs is a 82 y.o. female who had a left brachiocephalic fistula created on 08/14/2021.  She subsequently presented with severe steal symptoms and on 09/04/2021 underwent ligation of her left brachiocephalic fistula.  She presented on 10/28/2021 with persistent hand weakness and pain.  On 12/14/2021 she underwent an arteriogram with drug-coated balloon angioplasty of the left brachial artery at the elbow.  At the time of her arteriogram she had single-vessel runoff on the left via the radial artery.  The ulnar artery was occluded.  The palmar arch was patent.  The interosseous artery was patent.  On my history, the patient states that she has had significant pain in the left shoulder since she underwent a block prior to her fistula in April.  Today she is complaining of significant shoulder pain which she has had but has gotten worse.  The pain seems to be mostly in the shoulder and not in the hand or forearm.  She dialyzes on Tuesdays Thursdays and Saturdays.  She has a functioning catheter.  She is scheduled to be seen in office tomorrow to evaluate her arm pain.  Past Medical History:  Diagnosis Date   Anemia of renal disease 06/16/2006   Qualifier: Diagnosis of  By: Marinell Blight, Dawn     Arthritis    CHF (congestive  heart failure) ([place])    COVID 2021   Depression    Diabetes mellitus    type 2   Hernia    Hyperlipidemia    Hypertension    Iron deficiency anemia    LEG EDEMA, BILATERAL 12/03/2008   Qualifier: Diagnosis of  By: Carlena Sax  MD, Colletta [place]     Low iron    Nodule of soft tissue 06/22/2012   Peripheral vascular disease ([place])    in legs   Pneumonia    teenager   Renal disorder    CKD - dialysis T/TH/Sa   Shortness of breath dyspnea    with exertion   SMALL BOWEL OBSTRUCTION, HX OF 02/15/2007   Qualifier: Diagnosis of  By: [name], HEIDI     Type 2 diabetes mellitus with diabetic peripheral angiopathy without gangrene ([place]) 12/16/2015    Family History  Problem Relation Age of Onset   Kidney disease Mother    Hypertension Mother    COPD Father        smoke   Cancer Father        Lung   Hypertension Father    Kidney disease Sister    Diabetes Daughter     SOCIAL HISTORY: Social History   Tobacco Use   Smoking status: Never    Passive exposure: Never   Smokeless tobacco: Never  Substance Use  Topics   Alcohol use: No    No Known Allergies  Current Facility-Administered Medications  Medication Dose Route Frequency Provider Last Rate Last Admin   oxymetazoline (AFRIN) 0.05 % nasal spray 1 spray  1 spray Each Nare Once Orvis Brill, DO       Current Outpatient Medications  Medication Sig Dispense Refill   aspirin 81 MG tablet Take 81 mg by mouth every other day.     atorvastatin (LIPITOR) 40 MG tablet TAKE 1 TABLET(40 MG) BY MOUTH DAILY AT 6 PM Strength: 40 mg 90 tablet 3   Blood Glucose Monitoring Suppl (ONETOUCH VERIO) w/Device KIT Use daily as indicated 1 kit 0   calcium acetate (PHOSLO) 667 MG capsule Take 667-1,334 mg by mouth See admin instructions. Take 1334 mg with each meal and 667 mg with each snack     clopidogrel (PLAVIX) 75 MG tablet Take 1 tablet (75 mg total) by mouth daily. 30 tablet 11   gabapentin (NEURONTIN) 100 MG capsule Take 100 mg by mouth 3  (three) times daily.     glucose blood (ONETOUCH VERIO) test strip Use as instructed 100 each 12   Insulin Pen Needle 31G X 8 MM MISC BD UltraFine III Pen Needles. For use with insulin pen device. Inject insulin 2X daily 100 each 3   isosorbide mononitrate (IMDUR) 30 MG 24 hr tablet Take 30 mg by mouth daily.     Lancets (ONETOUCH ULTRASOFT) lancets Once daily testing plus prn for hypoglycemia 100 each 9   LANTUS SOLOSTAR 100 UNIT/ML Solostar Pen Inject 42 Units into the skin at bedtime. PLEASE MAKE AN APPOINTMENT TO SEE PCP 15 mL 3   naloxone (NARCAN) nasal spray 4 mg/0.1 mL Use in both nostrils if signs of opioid OD 1 each 2   NEEDLE, DISP, 30 G (B-D DISP NEEDLE 30GX1") 30G X 1" MISC 1 each by Does not apply route daily. 100 each 2   OneTouch Delica Lancets 99991111 MISC Use daily as indicated 100 each 0   oxyCODONE-acetaminophen (PERCOCET) 5-325 MG tablet Take 1 tablet by mouth every 6 (six) hours as needed for severe pain. 10 tablet 0    REVIEW OF SYSTEMS:  [X]$  denotes positive finding, [ ]$  denotes negative finding Cardiac  Comments:  Chest pain or chest pressure:    Shortness of breath upon exertion: x   Short of breath when lying flat:    Irregular heart rhythm:        Vascular    Pain in calf, thigh, or hip brought on by ambulation:    Pain in feet at night that wakes you up from your sleep:     Blood clot in your veins:    Leg swelling:         Pulmonary    Oxygen at home:    Productive cough:     Wheezing:         Neurologic    Sudden weakness in arms or legs:     Sudden numbness in arms or legs:     Sudden onset of difficulty speaking or slurred speech:    Temporary loss of vision in one eye:     Problems with dizziness:         Gastrointestinal    Blood in stool:     Vomited blood:         Genitourinary    Burning when urinating:     Blood in urine:        Psychiatric  Major depression:         Hematologic    Bleeding problems:    Problems with blood  clotting too easily:        Skin    Rashes or ulcers:        Constitutional    Fever or chills:    -  PHYSICAL EXAM:   Vitals:   06/11/2022 1455  BP: 112/68  Pulse: 81  Resp: (!) 23  Temp: (!) 97.5 F (36.4 C)  SpO2: 93%  Weight: 94.8 kg  Height: 5' 2"$  (1.575 m)   Body mass index is 38.23 kg/m. GENERAL: The patient is a well-nourished female, in no acute distress. The vital signs are documented above. CARDIAC: There is a regular rate and rhythm.  VASCULAR: I do not detect carotid bruits. On the left side, which is the symptomatic side, she has a biphasic radial signal with the Doppler and a brisk palmar arch signal with the Doppler.  I do not get an ulnar signal. On the right side she has a radial ulnar and palmar arch signal with the Doppler. She has no significant upper extremity swelling. She has a right IJ tunneled dialysis catheter. PULMONARY: There is good air exchange bilaterally without wheezing or rales. ABDOMEN: Soft and non-tender with normal pitched bowel sounds.  MUSCULOSKELETAL: There are no major deformities. NEUROLOGIC: No focal weakness or paresthesias are detected. SKIN: There are no ulcers or rashes noted. PSYCHIATRIC: The patient has a normal affect.  DATA:    CHEST X-RAY: Unremarkable.  LEFT SHOULDER X-RAY: She has degenerative changes of the before meals and glenohumeral joints.  There is a narrowed subacromial space suggesting rotator cuff pathology.  There is no acute osseous abnormality.  Rachel Hobbs [facility]

## 2022-06-08 NOTE — Hospital Course (Signed)
82 y.o Left shoulder pain. ESRD on HD. Arthritis. On and off for a long time, worse today? Elevated trop.  Got 2 dialysis hrs  Cardiology consulted -admit and trend troponin -no heparization at this time  Vascular involved, said no ischemia in arm.

## 2022-06-08 NOTE — H&P (Cosign Needed)
Hospital Admission History and Physical Service Pager: [phone]  Patient name: Rachel Hobbs Medical record number: [id number] Date of Birth: [birth year] Age: 82 y.o. Gender: female  Primary Care Provider: Arlyce Dice, MD Consultants: Cardiology Code Status: Full code Preferred Emergency Contact: Rachel Hobbs Information     Name Relation Home Work Mobile   Rachel Hobbs Daughter [phone]  [phone]   [facility] Daughter   [phone]       Chief Complaint: Acute on chronic left shoulder pain.  Assessment and Plan: Rachel Hobbs is a 82 y.o. female presenting with acute on chronic left shoulder pain.  Pertinent past medical history of diastolic CHF, ESRD on HD T/TH/sat, HLD, type 2 diabetes with neuropathy, morbid obesity and osteoarthritis.  Leading differential for left shoulder pain includes: Degenerative changes 2/2 standing osteoporosis (as evidenced on x-ray), rotator cuff pathology (4/5 str with flexion/abduction of shoulder), thoracic outlet syndrome (cool extremity, subjective pins and needles, pulse present), and most concerning ACS (troponin 373>> 388, no ST elevation on EKG).  Other differentials considered and less likely: Ischemia (radial pulse present, VVS does not believe this is ischemic), fracture (no evidence on x-ray).  Will admit patient for ACS workup as advised by Cardiology.  Additional differential and plan as below.  * Elevated troponin Admitted for ACS r/o and observation. Agree with cardiology her clinical picture is more consistent with demand ischemia. Trop 373>>388 (will not trend further unless clinically worsens or changes). Fortunately no STEMI on EKG. No chest pain, back pain, nor NV. Last echo in 11/2020 LVEF of 66% and grade 1 diastolic dysfunction.  -Admit to FMTS, med-tele, attending Dr. Gwendlyn Hobbs -Cardiology following, appreciate recs -Pending echocardiogram -Cardiac telemetry -Continue aspirin 81 mg daily, atorvastatin  40 mg daily; plan for increase Imdur to 60 mg on 2/21 -A.m. lipid panel  Left shoulder pain Differentials as above.  Denies trauma. 4/5 str with flexion/abduction of left shoulder. Pain currently controlled from Dilaudid 1 mg dose @1600$ . -PT/OT eval and treat -Pain management with Tylenol, Voltaren gel, lidocaine patch  ESRD (end stage renal disease) on dialysis [facility]) Schedule of T/TH/SA. Had 2 hours of dialysis today.  -Day team to consult nephrology in a.m. to resume dialysis  DM type 2, controlled, with complication ([place]) [id number] 7.1 six months ago.  Glucose 98.  Takes 42 unites Lantus at night. Will adjust insulin as appropriate during admission. -20 Units lantus -CBG with meals   FEN/GI: Renal diet, fluid restriction 1200 VTE Prophylaxis: Heparin subcu  Disposition: Med telemetry  History of Present Illness:  Rachel Hobbs is a 82 y.o. female presenting with acute on chronic left shoulder pain  Patient notes her left shoulder has been hurting since dialysis this morning. She has pain most of the time, but today was worse. Denies chest pain, back pain, nausea/vomiting. Moving her shoulder does not make the pain worse. Denies shooting sensation of pain.   Daughter present in room notes that her mother is normally very talkative. She normally can get up on her own, move around with her cane. She is able to mostly dress herself. She used to get dialysis through her left arm, but now has central access. She was complaining of shortness of breath.   In the ED, received 1 mg Dilaudid (which improved pain), CXR/x-ray left shoulder unremarkable aside from osteoarthritic changes in shoulder.  Troponin trended relatively flat.  EKG without ischemic changes.  Vascular surgery consulted, evaluated and believe this is ischemia.  Cardiology consulted.  Family medicine paged for admission.  Review Of Systems: Per HPI.  Pertinent Past Medical History: Diastolic CHF 123456 w/ neuropathy ESRD on HD  T/TH/Sat HLD Morbid Obesity Osteoarthritis  Remainder reviewed in history tab.   Pertinent Past Surgical History: Left arm AV fistula (08/06/2021) Ligation of left arm cephalic vein ([id number]) Aortic arch angiography (11/2021)  Remainder reviewed in history tab.   Pertinent Social History: Tobacco use: never Alcohol use: none Other Substance use: none Lives with daughter  Pertinent Family History: Mother: kidney disease, HTN Father: COPD, cancer, HTN Sister: kidney disease Reviewed in history tab.   Important Outpatient Medications: Aspirin 81 mg daily Atorvastatin 40 mg daily Calcium acetate (taken with meals) Clopidogrel 75 mg daily Gabapentin 100 mg 3 times daily Imdur 30 mg daily Lantus 42 units at bedtime Percocet 5-325 mg every 6 hours  Unable to confirm with patient, unfortunately daughter does not know medications.  Remainder reviewed in medication history.   Objective: BP 96/66   Pulse 77   Temp 97.7 F (36.5 C) (Oral)   Resp (!) 22   Ht 5' 2"$  (1.575 m)   Wt 94.8 kg   SpO2 94%   BMI 38.23 kg/m  Exam: General: NAD, chronically ill-appearing, resting comfortably in bed Eyes: EOM intact bilaterally, normal conjunctiva ENTM: Normocephalic, atraumatic head.  Moist mucous membranes, no erythema. Cardiovascular: RRR, no murmurs Respiratory: Coarse breath sounds anteriorly, normal work of breathing on room air Gastrointestinal: Soft, not distended, nontender.  Bowel sounds present Extremities: Well-perfused upper and lower extremities, moves all 4 Neuro: Alert and oriented x 4.  Sleepy but arousable, answers questions appropriately.  Labs:  CBC BMET  Recent Labs  Lab 06/07/2022 1605  WBC 6.4  HGB 12.9  HCT 38.9  PLT 157   Recent Labs  Lab 06/11/2022 1605  NA 134*  K 4.5  CL 93*  CO2 28  BUN 28*  CREATININE 6.29*  GLUCOSE 98  CALCIUM 8.7*    Troponin: 373>>388  EKG: Sinus rhythm, no STEMI  Imaging Studies Performed:  DG Chest 2  View Result Date: 06/16/2022 IMPRESSION: Stable cardiomegaly.  No acute lung process.  DG Shoulder Left Result Date: 06/13/2022 IMPRESSION: Degenerative changes of the [facility] and glenohumeral joints. Narrowed subacromial space suggesting rotator cuff pathology. No acute osseous abnormality   Leslie Dales DO 06/05/2022, 12:50 AM PGY-1, [place] Intern pager: [phone], text pages welcome Secure chat group [place] Upper-Level Resident Addendum   I have independently interviewed and examined the patient. I have discussed the above with Dr. Nelda Bucks and agree with the documented plan. My edits for correction/addition/clarification are included above. Please see any attending notes.   Wells Guiles, DO PGY-2, [facility] 06/06/2022 12:50 AM  FPTS Service pager: [phone] (text pages welcome through [facility])

## 2022-06-08 NOTE — ED Notes (Signed)
Family notified staff that pt was "making weird noise". This RN assess the pt at the bedside. Pt denies being in new pain, denies SOB, and does not appear to be in distress. Pt states " I just feel weird" and moaning. Provider Messick made aware.

## 2022-06-08 NOTE — Consult Note (Signed)
Cardiology Consult:   Patient ID: Rachel Hobbs; MRN: [id number]; DOB: [birth year]   Admission date: 05/20/2022  Primary Care Provider: Arlyce Dice, MD Primary Cardiologist: Dr. Beckie Busing Primary Electrophysiologist:  NA  Chief Complaint:  left arm pain  Patient Profile:   Rachel Hobbs is a 82 y.o. female with a history of Type II DM with ESRD, Diabetic neuropathy, PAD; Pain after ligation of AV fistula with PAD intervention 2023 (seen by vascular this admission), HTN, HfpEF Morbid obesity who was seen for left arm pain. Found to have elevated troponin  History of Present Illness:   [name] is feeling well presently.  She received IV Diludid at 1600 and 18 00, at 1900 evaluation she was somnolent but rousable.  Her granddaugther was at bedside and is able to provider history.  Noted that patient has had left arm pain for some time.  It improved, but did not resolve, with VVS intervention 11/2021.  Saw Dr Harl Bowie in early 2024 for [place].  At that point she was ambulatory with a cain, had DOE, but no resting SOB.  Never complained of syncope; no palpitations or funny heart beats. She had a large adominal hernia that was getting larger.  No PND or orthopnea.  Was doing better. Dr. Harl Bowie had planned to assess for anemia (has some history) and get an echocardiogram (had been ordered for 06/14/22)   Arm pain began to worsen over the past week leaded to ED evaluation.  Felt cold and tingling in the hand.  She has received IV narcotics for this and is somnolent but now pain free.  Prior cardiac testing including  - hs troponin 373-> 388 (in may 2023 was 24-> 25) - RCA and LAD CAC and aortic atherosclerosis on 01/12/22 CT Abdomen pelvis - Echo with preserved EF from 2022 reporting  Cardiology called for troponin elevation.  Past Medical History:  Diagnosis Date   Anemia of renal disease 06/16/2006   Qualifier: Diagnosis of  By: Marinell Blight, Dawn     Arthritis    CHF (congestive  heart failure) ([place])    COVID 2021   Depression    Diabetes mellitus    type 2   Hernia    Hyperlipidemia    Hypertension    Iron deficiency anemia    LEG EDEMA, BILATERAL 12/03/2008   Qualifier: Diagnosis of  By: Carlena Sax  MD, Colletta [place]     Low iron    Nodule of soft tissue 06/22/2012   Peripheral vascular disease ([place])    in legs   Pneumonia    teenager   Renal disorder    CKD - dialysis T/TH/Sa   Shortness of breath dyspnea    with exertion   SMALL BOWEL OBSTRUCTION, HX OF 02/15/2007   Qualifier: Diagnosis of  By: [name], HEIDI     Type 2 diabetes mellitus with diabetic peripheral angiopathy without gangrene ([place]) 12/16/2015    Past Surgical History:  Procedure Laterality Date   ABDOMINAL HYSTERECTOMY     in the 60's   AORTIC ARCH ANGIOGRAPHY N/A 12/14/2021   Procedure: AORTIC ARCH ANGIOGRAPHY;  Surgeon: [name], MD;  Location: [facility];  Service: Cardiovascular;  Laterality: N/A;   AV FISTULA PLACEMENT Left 12/05/2015   Procedure: RADIOCEPHALIC VERSUS BRACHIOCEPHALIC ARTERIOVENOUS (AV) FISTULA CREATION;  Surgeon: [name], MD;  Location: [facility] [place];  Service: Vascular;  Laterality: Left;   AV FISTULA PLACEMENT Left 05/20/2017   Procedure: INSERTION OF  ARTERIOVENOUS (AV) GORE-TEX VASCULAR STRETCH 4-7 GRAFT ARM LEFT UPPER ARM;  Surgeon: [name], MD;  Location: [facility];  Service: Vascular;  Laterality: Left;   AV FISTULA PLACEMENT Left 08/14/2021   Procedure: LEFT ARM ARTERIOVENOUS (AV) FISTULA CREATION;  Surgeon: [name], MD;  Location: [place] [facility];  Service: Vascular;  Laterality: Left;   [place] Left 06/04/2016   Procedure: FIRST STAGE BASILIC VEIN TRANSPOSITION;  Surgeon: [name], MD;  Location: [facility];  Service: Vascular;  Laterality: Left;   [place] Left 08/04/2016   Procedure: LEFT UPPER ARM [place], SECOND STAGE;  Surgeon: [name], MD;  Location: [facility];   Service: Vascular;  Laterality: Left;   HERNIA REPAIR     umbilical in the [id number]   INSERTION OF DIALYSIS CATHETER Right 12/05/2015   Procedure: INSERTION OF DIALYSIS CATHETER;  Surgeon: [name], MD;  Location: [facility];  Service: Vascular;  Laterality: Right;   IR THROMBECTOMY AV FISTULA W/THROMBOLYSIS/PTA INC/SHUNT/IMG LEFT Left 05/09/2019   IR US GUIDE VASC ACCESS LEFT  05/09/2019   LIGATION OF COMPETING BRANCHES OF ARTERIOVENOUS FISTULA Left 09/04/2021   Procedure: LIGATION OF LEFT ARM CEPHALIC VEIN;  Surgeon: [name], MD;  Location: [facility];  Service: Vascular;  Laterality: Left;   PERIPHERAL VASCULAR BALLOON ANGIOPLASTY  12/14/2021   Procedure: PERIPHERAL VASCULAR BALLOON ANGIOPLASTY;  Surgeon: [name], MD;  Location: [facility];  Service: Cardiovascular;;   UPPER EXTREMITY ANGIOGRAPHY Left 12/14/2021   Procedure: Upper Extremity Angiography;  Surgeon: [name], MD;  Location: [facility];  Service: Cardiovascular;  Laterality: Left;   UPPER EXTREMITY VENOGRAPHY Bilateral 06/15/2021   Procedure: UPPER EXTREMITY VENOGRAPHY;  Surgeon: [name], MD;  Location: [facility];  Service: Cardiovascular;  Laterality: Bilateral;     Medications Prior to Admission: Prior to Admission medications   Medication Sig Start Date End Date Taking? Authorizing Provider  aspirin 81 MG tablet Take 81 mg by mouth every other day.    [provider]  atorvastatin (LIPITOR) 40 MG tablet TAKE 1 TABLET(40 MG) BY MOUTH DAILY AT 6 PM Strength: 40 mg 05/28/21   Lurline Del, DO  Blood Glucose Monitoring Suppl [facility] VERIO) w/Device KIT Use daily as indicated 12/25/21   Darci Current, DO  calcium acetate (PHOSLO) 667 MG capsule Take 667-1,334 mg by mouth See admin instructions. Take 1334 mg with each meal and 667 mg with each snack 05/10/16   [provider]  clopidogrel (PLAVIX) 75 MG tablet Take 1 tablet (75 mg  total) by mouth daily. 12/14/21 12/14/22  [name], MD  gabapentin (NEURONTIN) 100 MG capsule Take 100 mg by mouth 3 (three) times daily.    [provider]  glucose blood (ONETOUCH VERIO) test strip Use as instructed 12/25/21   Darci Current, DO  Insulin Pen Needle 31G X 8 MM MISC BD UltraFine III Pen Needles. For use with insulin pen device. Inject insulin 2X daily 12/25/21   Darci Current, DO  isosorbide mononitrate (IMDUR) 30 MG 24 hr tablet Take 30 mg by mouth daily. 03/29/21   [provider]  Lancets Glory Rosebush ULTRASOFT) lancets Once daily testing plus prn for hypoglycemia 12/25/21   Darci Current, DO  LANTUS SOLOSTAR 100 UNIT/ML Solostar Pen Inject 42 Units into the skin at bedtime. PLEASE MAKE AN APPOINTMENT TO SEE PCP 12/25/21   Darci Current, DO  naloxone [facility]) nasal spray 4 mg/0.1  mL Use in both nostrils if signs of opioid OD 09/07/21   Kommor, Madison, MD  NEEDLE, DISP, 30 G (B-D DISP NEEDLE 30GX1") 30G X 1" MISC 1 each by Does not apply route daily. 12/25/21   Darci Current, DO  OneTouch Delica Lancets [id number] MISC Use daily as indicated 12/25/21   Darci Current, DO  oxyCODONE-acetaminophen (PERCOCET) 5-325 MG tablet Take 1 tablet by mouth every 6 (six) hours as needed for severe pain. 12/25/21 12/25/22  Darci Current, DO     Allergies:   No Known Allergies  Social History:   Social History   Socioeconomic History   Marital status: Divorced    Spouse name: Not on file   Number of children: 1   Years of education: 12   Highest education level: 12th grade  Occupational History   Occupation: Retired- [employer]     Employer: RETIRED  Tobacco Use   Smoking status: Never    Passive exposure: Never   Smokeless tobacco: Never  Vaping Use   Vaping Use: Never used  Substance and Sexual Activity   Alcohol use: No   Drug use: No   Sexual activity: Not Currently  Other Topics Concern   Not on file  Social History Narrative   Patient lives with daughter [name])    Important Relationships Daughter   Education / Work:  76 th grade/ 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)    Interests / Fun: Sings in choir at [place], going to [employer], and going to grandkids' games and school events    Current Stressors: Denies   Religious / Personal Beliefs: "I believe in God."         *Updated 03/2020*                                                                                                          Social Determinants of Health   Financial Resource Strain: Low Risk  (04/16/2020)   Overall Financial Resource Strain (CARDIA)    Difficulty of Paying Living Expenses: Not hard at all  Food Insecurity: No Food Insecurity (04/16/2020)   Hunger Vital Sign    Worried About Running Out of Food in the Last Year: Never true    [place] in the Last Year: Never true  Transportation Needs: No Transportation Needs (04/16/2020)   PRAPARE - Hydrologist (Medical): No    Lack of Transportation (Non-Medical): No  Physical Activity: Inactive (04/16/2020)   Exercise Vital Sign    Days of Exercise per Week: 0 days    Minutes of Exercise per Session: 0 min  Stress: No Stress Concern Present (04/16/2020)   [place]    Feeling of Stress : Not at all  Social Connections: Moderately Integrated (04/16/2020)   Social Connection and Isolation Panel [NHANES]    Frequency of Communication with Friends and Family: More than three times a week    Frequency of Social Gatherings with Friends and Family: More than  three times a week    Attends Religious Services: More than 4 times per year    Active Member of Clubs or Organizations: Yes    Attends Archivist Meetings: More than 4 times per year    Marital Status: Divorced  Intimate Partner Violence: Not At Risk (04/16/2020)   Humiliation, Afraid, Rape, and Kick questionnaire    Fear of Current or  Ex-Partner: No    Emotionally Abused: No    Physically Abused: No    Sexually Abused: No    Family History:   The patient's family history includes COPD in her father; Cancer in her father; Diabetes in her daughter; Hypertension in her father and mother; Kidney disease in her mother and sister.    ROS:  Please see the history of present illness.     Physical Exam/Data:   Vitals:   05/26/2022 1844 06/04/2022 1900 06/04/2022 1930 06/16/2022 2000  BP:  125/71 124/80 102/77  Pulse:  (!) 104    Resp:  15 16 (!) 25  Temp: 98.9 F (37.2 C)     TempSrc: Oral     SpO2:  94%    Weight:      Height:       No intake or output data in the 24 hours ending 05/24/2022 2018 Filed Weights   05/30/2022 1455  Weight: 94.8 kg   Body mass index is 38.23 kg/m.   Gen: no distress,  morbid obesity Neck: No JVD Cardiac: No Rubs or Gallops, soft diastolic murmur, RRR Respiratory: Clear to auscultation bilaterally, diminished effort, normal  respiratory rate GI: Soft, nontender Integument: Chronic skin changes without pitting edema Neuro:  At time of evaluation, alert and oriented to person/place/situation  to some degree  EKG:  The ECG that was done  was personally reviewed and demonstrates  06/07/2022: Sinus rhythm, rate 93 with 1st HB and anterior infarct pattern, low voltage QRS (precordial)  Relevant CV Studies: Cardiac Studies & Procedures       ECHOCARDIOGRAM  ECHOCARDIOGRAM COMPLETE 11/26/2020  Narrative ECHOCARDIOGRAM REPORT    Patient Name:   ORIANE WIELGUS Date of Exam: 11/26/2020 Medical Rec #:  [id number]     Height:       62.0 in Accession #:    [id number]    Weight:       226.4 lb Date of Birth:  08/30/1940      BSA:          2.015 m Patient Age:    82 years      BP:           190/67 mmHg Patient Gender: F             HR:           88 bpm. Exam Location:  Outpatient  Procedure: 2D Echo, 3D Echo, Cardiac Doppler, Color Doppler and Strain Analysis  Indications:    R06.02  SOB  History:        Patient has prior history of Echocardiogram examinations, most recent 07/01/2015. CHF; Risk Factors:Hypertension, Diabetes and Dyslipidemia. LVH. ESRD.  Sonographer:    Roseanna Rainbow Referring Phys: [id number] Kinnie Feil   Sonographer Comments: Technically difficult study due to poor echo windows and patient is morbidly obese. Image acquisition challenging due to patient body habitus. Global longitudinal strain was attempted. Patient has large mass in left abdomenal region. IMPRESSIONS   1. Left ventricular ejection fraction by 3D volume is 66 %. The left ventricle has normal  function. The left ventricle has no regional wall motion abnormalities. There is moderate left ventricular hypertrophy. Left ventricular diastolic parameters are consistent with Grade I diastolic dysfunction (impaired relaxation). 2. Right ventricular systolic function is normal. The right ventricular size is normal. There is mildly elevated pulmonary artery systolic pressure. The estimated right ventricular systolic pressure is [id number] mmHg. 3. There are calcified mitral valve chordae. The mitral valve is normal in structure. Trivial mitral valve regurgitation. No evidence of mitral stenosis. The mean mitral valve gradient is 4.0 mmHg. 4. The aortic valve is normal in structure. Aortic valve regurgitation is not visualized. No aortic stenosis is present. 5. The inferior vena cava is normal in size with greater than 50% respiratory variability, suggesting right atrial pressure of 3 mmHg.  FINDINGS Left Ventricle: Left ventricular ejection fraction by 3D volume is 66 %. The left ventricle has normal function. The left ventricle has no regional wall motion abnormalities. The left ventricular internal cavity size was normal in size. There is moderate left ventricular hypertrophy. Left ventricular diastolic parameters are consistent with Grade I diastolic dysfunction (impaired relaxation).  Right Ventricle:  The right ventricular size is normal. No increase in right ventricular wall thickness. Right ventricular systolic function is normal. There is mildly elevated pulmonary artery systolic pressure. The tricuspid regurgitant velocity is 2.73 m/s, and with an assumed right atrial pressure of 8 mmHg, the estimated right ventricular systolic pressure is [id number] mmHg.  Left Atrium: Left atrial size was normal in size.  Right Atrium: Right atrial size was normal in size.  Pericardium: There is no evidence of pericardial effusion.  Mitral Valve: There are calcified mitral valve chordae. The mitral valve is normal in structure. There is mild thickening of the mitral valve leaflet(s). There is mild calcification of the mitral valve leaflet(s). Trivial mitral valve regurgitation. No evidence of mitral valve stenosis. MV peak gradient, 8.6 mmHg. The mean mitral valve gradient is 4.0 mmHg.  Tricuspid Valve: The tricuspid valve is normal in structure. Tricuspid valve regurgitation is mild . No evidence of tricuspid stenosis.  Aortic Valve: The aortic valve is normal in structure. Aortic valve regurgitation is not visualized. No aortic stenosis is present.  Pulmonic Valve: The pulmonic valve was normal in structure. Pulmonic valve regurgitation is not visualized. No evidence of pulmonic stenosis.  Aorta: The aortic root is normal in size and structure.  Venous: The inferior vena cava is normal in size with greater than 50% respiratory variability, suggesting right atrial pressure of 3 mmHg.  IAS/Shunts: No atrial level shunt detected by color flow Doppler.   LEFT VENTRICLE PLAX 2D LVIDd:         3.30 cm         Diastology LVIDs:         2.40 cm         LV e' medial:    5.66 cm/s LV PW:         1.40 cm         LV E/e' medial:  19.3 LV IVS:        1.40 cm         LV e' lateral:   5.33 cm/s LVOT diam:     1.70 cm         LV E/e' lateral: 20.5 LV SV:         56 LV SV Index:   28              2D LVOT Area:  2.27 cm        Longitudinal Strain 2D Strain GLS  -25.7 % LV Volumes (MOD)               Avg: LV vol d, MOD    57.7 ml A2C:                           3D Volume EF LV vol d, MOD    43.1 ml       LV 3D EF:    Left A4C:                                        ventricul LV vol s, MOD    20.9 ml                    ar A2C:                                        ejection LV vol s, MOD    13.7 ml                    fraction A4C:                                        by 3D LV SV MOD A2C:   36.8 ml                    volume is LV SV MOD A4C:   43.1 ml                    66 %. LV SV MOD BP:    36.3 ml  3D Volume EF: 3D EF:        66 % LV EDV:       66 ml LV ESV:       22 ml LV SV:        43 ml  RIGHT VENTRICLE             IVC RV S prime:     11.50 cm/s  IVC diam: 1.40 cm TAPSE (M-mode): 1.6 cm  LEFT ATRIUM             Index       RIGHT ATRIUM           Index LA diam:        3.20 cm 1.59 cm/m  RA Area:     10.20 cm LA Vol (A2C):   29.2 ml 14.49 ml/m RA Volume:   19.10 ml  9.48 ml/m LA Vol (A4C):   20.3 ml 10.07 ml/m LA Biplane Vol: 24.9 ml 12.36 ml/m AORTIC VALVE LVOT Vmax:   131.00 cm/s LVOT Vmean:  89.400 cm/s LVOT VTI:    0.247 m  AORTA Ao Root diam: 2.70 cm Ao Asc diam:  3.00 cm  MITRAL VALVE                TRICUSPID VALVE MV Area (PHT): 4.15 cm     TR Peak grad:   29.8 mmHg MV Area VTI:   1.84 cm     TR Vmax:  273.00 cm/s MV Peak grad:  8.6 mmHg MV Mean grad:  4.0 mmHg     SHUNTS MV Vmax:       1.47 m/s     Systemic VTI:  0.25 m MV Vmean:      92.1 cm/s    Systemic Diam: 1.70 cm MV Decel Time: 183 msec MV E velocity: 109.00 cm/s MV A velocity: 141.00 cm/s MV E/A ratio:  0.77  Candee Furbish MD Electronically signed by Candee Furbish MD Signature Date/Time: 11/26/2020/4:57:37 PM    Final               Laboratory Data:  Chemistry Recent Labs  Lab 06/12/2022 1605  NA 134*  K 4.5  CL 93*  CO2 28  GLUCOSE 98  BUN 28*  CREATININE 6.29*  CALCIUM  8.7*  GFRNONAA 6*  ANIONGAP 13    No results for input(s): "PROT", "ALBUMIN", "AST", "ALT", "ALKPHOS", "BILITOT" in the last 168 hours. Hematology Recent Labs  Lab 05/27/2022 1605  WBC 6.4  RBC 3.67*  HGB 12.9  HCT 38.9  MCV 106.0*  MCH 35.1*  MCHC 33.2  RDW 15.0  PLT 157   Cardiac EnzymesNo results for input(s): "TROPONINI" in the last 168 hours. No results for input(s): "TROPIPOC" in the last 168 hours.  BNPNo results for input(s): "BNP", "PROBNP" in the last 168 hours.  DDimer No results for input(s): "DDIMER" in the last 168 hours.  Radiology/Studies:  DG Chest 2 View  Result Date: 06/02/2022 CLINICAL DATA:  Left shoulder pain.  Neck crepitus. EXAM: CHEST - 2 VIEW COMPARISON:  Chest radiographs 04/20/2022 and 09/12/2021 FINDINGS: Right internal jugular dual-lumen central venous catheter tip again overlies the superior vena cava/right atrial junction. Cardiac silhouette is moderately enlarged. Mediastinal contours within normal limits. Moderate calcification within the aortic arch. The lungs appear clear. No pleural effusion or pneumothorax is seen. Moderate multilevel degenerative disc changes of the thoracic spine. There are two sets of vascular stents partially visualized within the left upper arm. IMPRESSION: Stable cardiomegaly.  No acute lung process. Electronically Signed   By: Yvonne Kendall M.D.   On: 05/31/2022 16:03   DG Shoulder Left  Result Date: 06/11/2022 CLINICAL DATA:  Shoulder pain with neck crepitus EXAM: LEFT SHOULDER - 2+ VIEW COMPARISON:  None Available. FINDINGS: Moderate AC joint degenerative change. Mild to moderate glenohumeral degenerative change. No fracture or malalignment. Narrowed appearing subacromial space. Stents in the left upper extremity IMPRESSION: Degenerative changes of the [facility] and glenohumeral joints. Narrowed subacromial space suggesting rotator cuff pathology. No acute osseous abnormality Electronically Signed   By: Donavan Foil M.D.   On:  06/04/2022 15:58    Assessment and Plan:   Elevated troponin - with known risk factors (PAD, CAC, HLD, DM with ESRD, and HTN) - clinically more consistent with demand ischemia complicated by ESRD than plaque rupture event - continue ASA and plavix (indication is for PAD intervention 11/2021) - continue atorvastatin 40 mg (lipids in AM wih Lp(A) - increase imdur to 60 mg for tomorrow - will get echocardiogram - if pain resolvea and no WMA, we will likely plan for a conservative approach  Left arm pain - ddx included ischemia (see above), steal syndrome (vascular consulted and does not think this is the case), or mechanical issue related to her rotator cuff  1st HB - monitor; deferring BB add at this time   For questions or updates, please contact [facility] Please consult [email or web address] for contact info under Cardiology/STEMI.  Rudean Haskell, MD [facility] [address] [place], [place] [address] [phone]  8:18 PM

## 2022-06-08 NOTE — Assessment & Plan Note (Signed)
Schedule of T/TH/SA. Had 2 hours of dialysis yesterday.  - Consult nephrology to resume dialysis

## 2022-06-09 ENCOUNTER — Observation Stay (HOSPITAL_COMMUNITY): Payer: Medicare HMO

## 2022-06-09 ENCOUNTER — Inpatient Hospital Stay (HOSPITAL_COMMUNITY): Admission: EM | Disposition: E | Payer: Self-pay | Source: Home / Self Care | Attending: Internal Medicine

## 2022-06-09 ENCOUNTER — Inpatient Hospital Stay (HOSPITAL_COMMUNITY): Payer: Medicare HMO

## 2022-06-09 ENCOUNTER — Ambulatory Visit (HOSPITAL_COMMUNITY): Payer: Medicare HMO

## 2022-06-09 ENCOUNTER — Ambulatory Visit: Payer: Medicare HMO | Admitting: Vascular Surgery

## 2022-06-09 DIAGNOSIS — R7989 Other specified abnormal findings of blood chemistry: Secondary | ICD-10-CM

## 2022-06-09 DIAGNOSIS — D72829 Elevated white blood cell count, unspecified: Secondary | ICD-10-CM | POA: Diagnosis present

## 2022-06-09 DIAGNOSIS — I132 Hypertensive heart and chronic kidney disease with heart failure and with stage 5 chronic kidney disease, or end stage renal disease: Secondary | ICD-10-CM | POA: Diagnosis not present

## 2022-06-09 DIAGNOSIS — I214 Non-ST elevation (NSTEMI) myocardial infarction: Secondary | ICD-10-CM | POA: Diagnosis not present

## 2022-06-09 DIAGNOSIS — I2510 Atherosclerotic heart disease of native coronary artery without angina pectoris: Secondary | ICD-10-CM | POA: Diagnosis not present

## 2022-06-09 DIAGNOSIS — Z992 Dependence on renal dialysis: Secondary | ICD-10-CM | POA: Diagnosis not present

## 2022-06-09 DIAGNOSIS — I469 Cardiac arrest, cause unspecified: Secondary | ICD-10-CM

## 2022-06-09 DIAGNOSIS — R9431 Abnormal electrocardiogram [ECG] [EKG]: Secondary | ICD-10-CM

## 2022-06-09 DIAGNOSIS — Z1152 Encounter for screening for COVID-19: Secondary | ICD-10-CM | POA: Diagnosis not present

## 2022-06-09 DIAGNOSIS — J9602 Acute respiratory failure with hypercapnia: Secondary | ICD-10-CM | POA: Diagnosis not present

## 2022-06-09 DIAGNOSIS — R570 Cardiogenic shock: Secondary | ICD-10-CM

## 2022-06-09 DIAGNOSIS — R0602 Shortness of breath: Secondary | ICD-10-CM | POA: Diagnosis not present

## 2022-06-09 DIAGNOSIS — D631 Anemia in chronic kidney disease: Secondary | ICD-10-CM | POA: Diagnosis not present

## 2022-06-09 DIAGNOSIS — J9601 Acute respiratory failure with hypoxia: Secondary | ICD-10-CM

## 2022-06-09 DIAGNOSIS — N186 End stage renal disease: Secondary | ICD-10-CM | POA: Diagnosis not present

## 2022-06-09 DIAGNOSIS — E1165 Type 2 diabetes mellitus with hyperglycemia: Secondary | ICD-10-CM | POA: Diagnosis not present

## 2022-06-09 DIAGNOSIS — I4901 Ventricular fibrillation: Secondary | ICD-10-CM | POA: Diagnosis not present

## 2022-06-09 DIAGNOSIS — Z8616 Personal history of COVID-19: Secondary | ICD-10-CM | POA: Diagnosis not present

## 2022-06-09 DIAGNOSIS — I700 Atherosclerosis of aorta: Secondary | ICD-10-CM | POA: Diagnosis not present

## 2022-06-09 DIAGNOSIS — Z6838 Body mass index (BMI) 38.0-38.9, adult: Secondary | ICD-10-CM | POA: Diagnosis not present

## 2022-06-09 DIAGNOSIS — M25512 Pain in left shoulder: Secondary | ICD-10-CM | POA: Diagnosis not present

## 2022-06-09 DIAGNOSIS — E1140 Type 2 diabetes mellitus with diabetic neuropathy, unspecified: Secondary | ICD-10-CM | POA: Diagnosis not present

## 2022-06-09 DIAGNOSIS — I959 Hypotension, unspecified: Secondary | ICD-10-CM | POA: Diagnosis not present

## 2022-06-09 DIAGNOSIS — E118 Type 2 diabetes mellitus with unspecified complications: Secondary | ICD-10-CM | POA: Diagnosis not present

## 2022-06-09 DIAGNOSIS — I472 Ventricular tachycardia, unspecified: Secondary | ICD-10-CM

## 2022-06-09 DIAGNOSIS — E1151 Type 2 diabetes mellitus with diabetic peripheral angiopathy without gangrene: Secondary | ICD-10-CM | POA: Diagnosis not present

## 2022-06-09 DIAGNOSIS — I462 Cardiac arrest due to underlying cardiac condition: Secondary | ICD-10-CM | POA: Diagnosis not present

## 2022-06-09 DIAGNOSIS — J90 Pleural effusion, not elsewhere classified: Secondary | ICD-10-CM | POA: Diagnosis not present

## 2022-06-09 DIAGNOSIS — Z4682 Encounter for fitting and adjustment of non-vascular catheter: Secondary | ICD-10-CM | POA: Diagnosis not present

## 2022-06-09 DIAGNOSIS — Z66 Do not resuscitate: Secondary | ICD-10-CM | POA: Diagnosis not present

## 2022-06-09 DIAGNOSIS — Z794 Long term (current) use of insulin: Secondary | ICD-10-CM | POA: Diagnosis not present

## 2022-06-09 DIAGNOSIS — E871 Hypo-osmolality and hyponatremia: Secondary | ICD-10-CM | POA: Diagnosis not present

## 2022-06-09 DIAGNOSIS — E8721 Acute metabolic acidosis: Secondary | ICD-10-CM | POA: Diagnosis not present

## 2022-06-09 DIAGNOSIS — R079 Chest pain, unspecified: Secondary | ICD-10-CM | POA: Diagnosis not present

## 2022-06-09 DIAGNOSIS — J9811 Atelectasis: Secondary | ICD-10-CM | POA: Diagnosis not present

## 2022-06-09 DIAGNOSIS — E875 Hyperkalemia: Secondary | ICD-10-CM | POA: Diagnosis not present

## 2022-06-09 DIAGNOSIS — E1122 Type 2 diabetes mellitus with diabetic chronic kidney disease: Secondary | ICD-10-CM | POA: Diagnosis not present

## 2022-06-09 DIAGNOSIS — I5023 Acute on chronic systolic (congestive) heart failure: Secondary | ICD-10-CM | POA: Diagnosis not present

## 2022-06-09 HISTORY — PX: CORONARY LITHOTRIPSY: CATH118330

## 2022-06-09 HISTORY — PX: LEFT HEART CATH AND CORONARY ANGIOGRAPHY: CATH118249

## 2022-06-09 HISTORY — PX: CORONARY BALLOON ANGIOPLASTY: CATH118233

## 2022-06-09 LAB — BASIC METABOLIC PANEL
Anion gap: 26 — ABNORMAL HIGH (ref 5–15)
Anion gap: 30 — ABNORMAL HIGH (ref 5–15)
BUN: 38 mg/dL — ABNORMAL HIGH (ref 8–23)
BUN: 42 mg/dL — ABNORMAL HIGH (ref 8–23)
CO2: 16 mmol/L — ABNORMAL LOW (ref 22–32)
CO2: 22 mmol/L (ref 22–32)
Calcium: 8.9 mg/dL (ref 8.9–10.3)
Calcium: 7.8 mg/dL — ABNORMAL LOW (ref 8.9–10.3)
Chloride: 91 mmol/L — ABNORMAL LOW (ref 98–111)
Chloride: 89 mmol/L — ABNORMAL LOW (ref 98–111)
Creatinine, Ser: 8.1 mg/dL — ABNORMAL HIGH (ref 0.44–1.00)
Creatinine, Ser: 8.19 mg/dL — ABNORMAL HIGH (ref 0.44–1.00)
GFR, Estimated: 5 mL/min — ABNORMAL LOW (ref >60)
GFR, Estimated: 5 mL/min — ABNORMAL LOW (ref >60)
Glucose, Bld: 252 mg/dL — ABNORMAL HIGH (ref 70–99)
Glucose, Bld: 221 mg/dL — ABNORMAL HIGH (ref 70–99)
Potassium: 5.5 mmol/L — ABNORMAL HIGH (ref 3.5–5.1)
Potassium: 5.6 mmol/L — ABNORMAL HIGH (ref 3.5–5.1)
Sodium: 133 mmol/L — ABNORMAL LOW (ref 135–145)
Sodium: 141 mmol/L (ref 135–145)

## 2022-06-09 LAB — POCT I-STAT 7, (LYTES, BLD GAS, ICA,H+H)
Acid-base deficit: 21 mmol/L — ABNORMAL HIGH (ref 0.0–2.0)
Acid-base deficit: 18 mmol/L — ABNORMAL HIGH (ref 0.0–2.0)
Bicarbonate: 8.5 mmol/L — ABNORMAL LOW (ref 20.0–28.0)
Bicarbonate: 10.7 mmol/L — ABNORMAL LOW (ref 20.0–28.0)
Calcium, Ion: 0.97 mmol/L — ABNORMAL LOW (ref 1.15–1.40)
Calcium, Ion: 0.99 mmol/L — ABNORMAL LOW (ref 1.15–1.40)
HCT: 41 % (ref 36.0–46.0)
HCT: 40 % (ref 36.0–46.0)
Hemoglobin: 13.9 g/dL (ref 12.0–15.0)
Hemoglobin: 13.6 g/dL (ref 12.0–15.0)
O2 Saturation: 100 %
O2 Saturation: 99 %
Potassium: 4.6 mmol/L (ref 3.5–5.1)
Potassium: 5.7 mmol/L — ABNORMAL HIGH (ref 3.5–5.1)
Sodium: 130 mmol/L — ABNORMAL LOW (ref 135–145)
Sodium: 127 mmol/L — ABNORMAL LOW (ref 135–145)
TCO2: 9 mmol/L — ABNORMAL LOW (ref 22–32)
TCO2: 12 mmol/L — ABNORMAL LOW (ref 22–32)
pCO2 arterial: 31.1 mmHg — ABNORMAL LOW (ref 32–48)
pCO2 arterial: 33.8 mmHg (ref 32–48)
pH, Arterial: 7.046 — CL (ref 7.35–7.45)
pH, Arterial: 7.11 — CL (ref 7.35–7.45)
pO2, Arterial: 431 mmHg — ABNORMAL HIGH (ref 83–108)
pO2, Arterial: 215 mmHg — ABNORMAL HIGH (ref 83–108)

## 2022-06-09 LAB — I-STAT ARTERIAL BLOOD GAS, ED
Acid-base deficit: 18 mmol/L — ABNORMAL HIGH (ref 0.0–2.0)
Bicarbonate: 7.9 mmol/L — ABNORMAL LOW (ref 20.0–28.0)
Calcium, Ion: 1.01 mmol/L — ABNORMAL LOW (ref 1.15–1.40)
HCT: 40 % (ref 36.0–46.0)
Hemoglobin: 13.6 g/dL (ref 12.0–15.0)
O2 Saturation: 98 %
Patient temperature: 97.5
Potassium: 5.5 mmol/L — ABNORMAL HIGH (ref 3.5–5.1)
Sodium: 127 mmol/L — ABNORMAL LOW (ref 135–145)
TCO2: 8 mmol/L — ABNORMAL LOW (ref 22–32)
pCO2 arterial: 19 mmHg — CL (ref 32–48)
pH, Arterial: 7.221 — ABNORMAL LOW (ref 7.35–7.45)
pO2, Arterial: 127 mmHg — ABNORMAL HIGH (ref 83–108)

## 2022-06-09 LAB — HEMOGLOBIN A1C
Hgb A1c MFr Bld: 7.8 % — ABNORMAL HIGH (ref 4.8–5.6)
Mean Plasma Glucose: 177.16 mg/dL

## 2022-06-09 LAB — RESP PANEL BY RT-PCR (RSV, FLU A&B, COVID)  RVPGX2
Influenza A by PCR: NEGATIVE
Influenza B by PCR: NEGATIVE
Resp Syncytial Virus by PCR: NEGATIVE
SARS Coronavirus 2 by RT PCR: NEGATIVE

## 2022-06-09 LAB — TROPONIN I (HIGH SENSITIVITY)
Troponin I (High Sensitivity): 863 ng/L (ref <18)
Troponin I (High Sensitivity): 1390 ng/L (ref <18)
Troponin I (High Sensitivity): 2685 ng/L (ref <18)

## 2022-06-09 LAB — I-STAT VENOUS BLOOD GAS, ED
Acid-base deficit: 5 mmol/L — ABNORMAL HIGH (ref 0.0–2.0)
Bicarbonate: 20.4 mmol/L (ref 20.0–28.0)
Calcium, Ion: 0.77 mmol/L — CL (ref 1.15–1.40)
HCT: 43 % (ref 36.0–46.0)
Hemoglobin: 14.6 g/dL (ref 12.0–15.0)
O2 Saturation: 55 %
Potassium: >8.5 mmol/L (ref 3.5–5.1)
Sodium: 124 mmol/L — ABNORMAL LOW (ref 135–145)
TCO2: 22 mmol/L (ref 22–32)
pCO2, Ven: 36.9 mmHg — ABNORMAL LOW (ref 44–60)
pH, Ven: 7.351 (ref 7.25–7.43)
pO2, Ven: 30 mmHg — CL (ref 32–45)

## 2022-06-09 LAB — LIPID PANEL
Cholesterol: 151 mg/dL (ref 0–200)
HDL: 61 mg/dL (ref >40)
LDL Cholesterol: 65 mg/dL (ref 0–99)
Total CHOL/HDL Ratio: 2.5 RATIO
Triglycerides: 123 mg/dL (ref <150)
VLDL: 25 mg/dL (ref 0–40)

## 2022-06-09 LAB — CBC
HCT: 42.4 % (ref 36.0–46.0)
HCT: 39.5 % (ref 36.0–46.0)
Hemoglobin: 13.7 g/dL (ref 12.0–15.0)
Hemoglobin: 12.2 g/dL (ref 12.0–15.0)
MCH: 35.5 pg — ABNORMAL HIGH (ref 26.0–34.0)
MCH: 35.1 pg — ABNORMAL HIGH (ref 26.0–34.0)
MCHC: 32.3 g/dL (ref 30.0–36.0)
MCHC: 30.9 g/dL (ref 30.0–36.0)
MCV: 109.8 fL — ABNORMAL HIGH (ref 80.0–100.0)
MCV: 113.5 fL — ABNORMAL HIGH (ref 80.0–100.0)
Platelets: 158 10*3/uL (ref 150–400)
Platelets: 131 10*3/uL — ABNORMAL LOW (ref 150–400)
RBC: 3.86 MIL/uL — ABNORMAL LOW (ref 3.87–5.11)
RBC: 3.48 MIL/uL — ABNORMAL LOW (ref 3.87–5.11)
RDW: 16.6 % — ABNORMAL HIGH (ref 11.5–15.5)
RDW: 15.5 % (ref 11.5–15.5)
WBC: 16.4 10*3/uL — ABNORMAL HIGH (ref 4.0–10.5)
WBC: 22.1 10*3/uL — ABNORMAL HIGH (ref 4.0–10.5)
nRBC: 0.4 % — ABNORMAL HIGH (ref 0.0–0.2)
nRBC: 0.5 % — ABNORMAL HIGH (ref 0.0–0.2)

## 2022-06-09 LAB — ECHOCARDIOGRAM COMPLETE
Area-P 1/2: 2.87 cm2
Height: 62 in
S' Lateral: 3.4 cm
Weight: 3344 oz

## 2022-06-09 LAB — LACTIC ACID, PLASMA
Lactic Acid, Venous: 8.8 mmol/L (ref 0.5–1.9)
Lactic Acid, Venous: >9 mmol/L (ref 0.5–1.9)
Lactic Acid, Venous: >9 mmol/L (ref 0.5–1.9)

## 2022-06-09 LAB — RENAL FUNCTION PANEL
Albumin: 2.6 g/dL — ABNORMAL LOW (ref 3.5–5.0)
Anion gap: 31 — ABNORMAL HIGH (ref 5–15)
BUN: 41 mg/dL — ABNORMAL HIGH (ref 8–23)
CO2: 21 mmol/L — ABNORMAL LOW (ref 22–32)
Calcium: 7.8 mg/dL — ABNORMAL LOW (ref 8.9–10.3)
Chloride: 88 mmol/L — ABNORMAL LOW (ref 98–111)
Creatinine, Ser: 8.18 mg/dL — ABNORMAL HIGH (ref 0.44–1.00)
GFR, Estimated: 5 mL/min — ABNORMAL LOW (ref >60)
Glucose, Bld: 218 mg/dL — ABNORMAL HIGH (ref 70–99)
Phosphorus: 9.1 mg/dL — ABNORMAL HIGH (ref 2.5–4.6)
Potassium: 5.6 mmol/L — ABNORMAL HIGH (ref 3.5–5.1)
Sodium: 140 mmol/L (ref 135–145)

## 2022-06-09 LAB — POCT ACTIVATED CLOTTING TIME
Activated Clotting Time: 347 seconds
Activated Clotting Time: 488 seconds

## 2022-06-09 LAB — D-DIMER, QUANTITATIVE: D-Dimer, Quant: 4.47 ug/mL-FEU — ABNORMAL HIGH (ref 0.00–0.50)

## 2022-06-09 LAB — GLUCOSE, CAPILLARY: Glucose-Capillary: 238 mg/dL — ABNORMAL HIGH (ref 70–99)

## 2022-06-09 LAB — CBG MONITORING, ED: Glucose-Capillary: 211 mg/dL — ABNORMAL HIGH (ref 70–99)

## 2022-06-09 LAB — TSH: TSH: 3.609 u[IU]/mL (ref 0.350–4.500)

## 2022-06-09 SURGERY — LEFT HEART CATH AND CORONARY ANGIOGRAPHY
Anesthesia: LOCAL

## 2022-06-09 MED ORDER — VASOPRESSIN 20 UNITS/100 ML INFUSION FOR SHOCK
0.0000 [IU]/min | INTRAVENOUS | Status: DC
  Administered 2022-06-09: 0.04 [IU]/min via INTRAVENOUS
  Filled 2022-06-09: qty 100

## 2022-06-09 MED ORDER — PROPOFOL 1000 MG/100ML IV EMUL: 0.0000 ug/kg/min | INTRAVENOUS | Status: DC

## 2022-06-09 MED ORDER — ROCURONIUM BROMIDE 10 MG/ML (PF) SYRINGE
PREFILLED_SYRINGE | INTRAVENOUS | Status: AC
  Administered 2022-06-09: 100 mg
  Filled 2022-06-09: qty 10

## 2022-06-09 MED ORDER — CALCIUM ACETATE (PHOS BINDER) 667 MG PO CAPS: 667.0000 mg | ORAL_CAPSULE | ORAL | Status: DC | PRN

## 2022-06-09 MED ORDER — VANCOMYCIN HCL 2000 MG/400ML IV SOLN
2000.0000 mg | Freq: Once | INTRAVENOUS | Status: DC
  Filled 2022-06-09: qty 400

## 2022-06-09 MED ORDER — STERILE WATER FOR INJECTION IV SOLN
INTRAVENOUS | Status: DC
  Filled 2022-06-09 (×2): qty 150

## 2022-06-09 MED ORDER — INSULIN ASPART 100 UNIT/ML IJ SOLN
0.0000 [IU] | Freq: Three times a day (TID) | INTRAMUSCULAR | Status: DC
  Administered 2022-06-09: 3 [IU] via SUBCUTANEOUS

## 2022-06-09 MED ORDER — SODIUM BICARBONATE 8.4 % IV SOLN
INTRAVENOUS | Status: DC | PRN
  Administered 2022-06-09: 50 meq via INTRAVENOUS

## 2022-06-09 MED ORDER — PROPOFOL 1000 MG/100ML IV EMUL
INTRAVENOUS | Status: AC
  Administered 2022-06-09: 5 ug/kg/min via INTRAVENOUS
  Filled 2022-06-09: qty 100

## 2022-06-09 MED ORDER — SODIUM BICARBONATE 8.4 % IV SOLN
INTRAVENOUS | Status: AC
  Administered 2022-06-09: 100 meq
  Filled 2022-06-09: qty 100

## 2022-06-09 MED ORDER — VANCOMYCIN HCL 2000 MG/400ML IV SOLN
2000.0000 mg | Freq: Once | INTRAVENOUS | Status: AC
  Administered 2022-06-09: 2000 mg via INTRAVENOUS
  Filled 2022-06-09: qty 400

## 2022-06-09 MED ORDER — HEPARIN SODIUM (PORCINE) 1000 UNIT/ML IJ SOLN
INTRAMUSCULAR | Status: AC
  Filled 2022-06-09: qty 10

## 2022-06-09 MED ORDER — VANCOMYCIN VARIABLE DOSE PER UNSTABLE RENAL FUNCTION (PHARMACIST DOSING): Status: DC

## 2022-06-09 MED ORDER — SODIUM CHLORIDE 0.9 % IV SOLN: 250.0000 mL | INTRAVENOUS | Status: DC

## 2022-06-09 MED ORDER — FAMOTIDINE 20 MG PO TABS: 20.0000 mg | ORAL_TABLET | Freq: Two times a day (BID) | ORAL | Status: DC

## 2022-06-09 MED ORDER — CALCITRIOL 0.5 MCG PO CAPS: 1.5000 ug | ORAL_CAPSULE | ORAL | Status: DC

## 2022-06-09 MED ORDER — LIDOCAINE HCL (PF) 1 % IJ SOLN
INTRAMUSCULAR | Status: AC
  Filled 2022-06-09: qty 30

## 2022-06-09 MED ORDER — PHENYLEPHRINE 80 MCG/ML (10ML) SYRINGE FOR IV PUSH (FOR BLOOD PRESSURE SUPPORT)
PREFILLED_SYRINGE | INTRAVENOUS | Status: AC
  Filled 2022-06-09: qty 10

## 2022-06-09 MED ORDER — ATORVASTATIN CALCIUM 40 MG PO TABS: 40.0000 mg | ORAL_TABLET | Freq: Every day | ORAL | Status: DC

## 2022-06-09 MED ORDER — SODIUM BICARBONATE 8.4 % IV SOLN: 100.0000 meq | Freq: Once | INTRAVENOUS | Status: AC

## 2022-06-09 MED ORDER — AMIODARONE HCL IN DEXTROSE 360-4.14 MG/200ML-% IV SOLN
60.0000 mg/h | INTRAVENOUS | Status: AC
  Filled 2022-06-09: qty 200

## 2022-06-09 MED ORDER — NOREPINEPHRINE 4 MG/250ML-% IV SOLN: 2.0000 ug/min | INTRAVENOUS | Status: DC

## 2022-06-09 MED ORDER — ETOMIDATE 2 MG/ML IV SOLN
INTRAVENOUS | Status: AC
  Administered 2022-06-09: 10 mg
  Filled 2022-06-09: qty 20

## 2022-06-09 MED ORDER — POLYETHYLENE GLYCOL 3350 17 G PO PACK: 17.0000 g | PACK | Freq: Every day | ORAL | Status: DC | PRN

## 2022-06-09 MED ORDER — SODIUM CHLORIDE 0.9 % IV SOLN: 250.0000 mL | INTRAVENOUS | Status: DC | PRN

## 2022-06-09 MED ORDER — ONDANSETRON 4 MG PO TBDP
8.0000 mg | ORAL_TABLET | Freq: Once | ORAL | Status: AC
  Administered 2022-06-09: 8 mg via ORAL
  Filled 2022-06-09: qty 2

## 2022-06-09 MED ORDER — HEPARIN (PORCINE) 25000 UT/250ML-% IV SOLN
1000.0000 [IU]/h | INTRAVENOUS | Status: DC
  Administered 2022-06-09: 1000 [IU]/h via INTRAVENOUS
  Filled 2022-06-09: qty 250

## 2022-06-09 MED ORDER — CANGRELOR BOLUS VIA INFUSION
INTRAVENOUS | Status: DC | PRN
  Administered 2022-06-09: 2844 ug via INTRAVENOUS

## 2022-06-09 MED ORDER — SODIUM CHLORIDE 0.9 % IV BOLUS
2850.0000 mL | Freq: Once | INTRAVENOUS | Status: AC
  Administered 2022-06-09: 2850 mL via INTRAVENOUS

## 2022-06-09 MED ORDER — NITROGLYCERIN 1 MG/10 ML FOR IR/CATH LAB
INTRA_ARTERIAL | Status: AC
  Filled 2022-06-09: qty 10

## 2022-06-09 MED ORDER — ASPIRIN 81 MG PO CHEW
81.0000 mg | CHEWABLE_TABLET | Freq: Every day | ORAL | Status: DC
  Administered 2022-06-09: 81 mg via ORAL
  Filled 2022-06-09: qty 1

## 2022-06-09 MED ORDER — SUCCINYLCHOLINE CHLORIDE 200 MG/10ML IV SOSY
PREFILLED_SYRINGE | INTRAVENOUS | Status: AC
  Filled 2022-06-09: qty 10

## 2022-06-09 MED ORDER — PANTOPRAZOLE SODIUM 40 MG IV SOLR: 40.0000 mg | INTRAVENOUS | Status: DC

## 2022-06-09 MED ORDER — ATORVASTATIN CALCIUM 80 MG PO TABS
80.0000 mg | ORAL_TABLET | Freq: Every day | ORAL | Status: DC
  Filled 2022-06-09: qty 1

## 2022-06-09 MED ORDER — FENTANYL CITRATE PF 50 MCG/ML IJ SOSY
PREFILLED_SYRINGE | INTRAMUSCULAR | Status: AC
  Filled 2022-06-09: qty 2

## 2022-06-09 MED ORDER — DOCUSATE SODIUM 50 MG/5ML PO LIQD: 100.0000 mg | Freq: Two times a day (BID) | ORAL | Status: DC | PRN

## 2022-06-09 MED ORDER — LIDOCAINE HCL (PF) 1 % IJ SOLN
INTRAMUSCULAR | Status: DC | PRN
  Administered 2022-06-09: 15 mL

## 2022-06-09 MED ORDER — CANGRELOR TETRASODIUM 50 MG IV SOLR
INTRAVENOUS | Status: AC
  Filled 2022-06-09: qty 50

## 2022-06-09 MED ORDER — SODIUM CHLORIDE 0.9% FLUSH: 3.0000 mL | INTRAVENOUS | Status: DC | PRN

## 2022-06-09 MED ORDER — ASPIRIN 81 MG PO CHEW: 81.0000 mg | CHEWABLE_TABLET | Freq: Every day | ORAL | Status: DC

## 2022-06-09 MED ORDER — HEPARIN BOLUS VIA INFUSION
2000.0000 [IU] | Freq: Once | INTRAVENOUS | Status: AC
  Administered 2022-06-09: 2000 [IU] via INTRAVENOUS
  Filled 2022-06-09: qty 2000

## 2022-06-09 MED ORDER — ATORVASTATIN CALCIUM 80 MG PO TABS: 80.0000 mg | ORAL_TABLET | Freq: Every day | ORAL | Status: DC

## 2022-06-09 MED ORDER — MAGNESIUM SULFATE 2 GM/50ML IV SOLN
INTRAVENOUS | Status: AC
  Filled 2022-06-09: qty 50

## 2022-06-09 MED ORDER — SODIUM CHLORIDE 0.9 % IV SOLN
INTRAVENOUS | Status: AC | PRN
  Administered 2022-06-09: 4 ug/kg/min via INTRAVENOUS

## 2022-06-09 MED ORDER — IOHEXOL 350 MG/ML SOLN
75.0000 mL | Freq: Once | INTRAVENOUS | Status: AC | PRN
  Administered 2022-06-09: 75 mL via INTRAVENOUS

## 2022-06-09 MED ORDER — HEPARIN SODIUM (PORCINE) 1000 UNIT/ML IJ SOLN
INTRAMUSCULAR | Status: DC | PRN
  Administered 2022-06-09: 8000 [IU] via INTRAVENOUS

## 2022-06-09 MED ORDER — SODIUM CHLORIDE 0.9% FLUSH: 3.0000 mL | Freq: Two times a day (BID) | INTRAVENOUS | Status: DC

## 2022-06-09 MED ORDER — TICAGRELOR 90 MG PO TABS
90.0000 mg | ORAL_TABLET | Freq: Two times a day (BID) | ORAL | Status: DC
  Filled 2022-06-09: qty 1

## 2022-06-09 MED ORDER — FENTANYL CITRATE PF 50 MCG/ML IJ SOSY: 25.0000 ug | PREFILLED_SYRINGE | INTRAMUSCULAR | Status: DC | PRN

## 2022-06-09 MED ORDER — CLOPIDOGREL BISULFATE 75 MG PO TABS: 75.0000 mg | ORAL_TABLET | Freq: Every day | ORAL | Status: DC

## 2022-06-09 MED ORDER — HEPARIN SODIUM (PORCINE) 1000 UNIT/ML DIALYSIS
1000.0000 [IU] | INTRAMUSCULAR | Status: DC | PRN
  Filled 2022-06-09: qty 6

## 2022-06-09 MED ORDER — INSULIN GLARGINE-YFGN 100 UNIT/ML ~~LOC~~ SOLN
5.0000 [IU] | Freq: Every day | SUBCUTANEOUS | Status: DC
  Filled 2022-06-09 (×2): qty 0.05

## 2022-06-09 MED ORDER — ACETAMINOPHEN 325 MG PO TABS
650.0000 mg | ORAL_TABLET | Freq: Four times a day (QID) | ORAL | Status: DC
  Administered 2022-06-09: 650 mg
  Filled 2022-06-09 (×2): qty 2

## 2022-06-09 MED ORDER — SODIUM BICARBONATE 8.4 % IV SOLN
INTRAVENOUS | Status: AC
  Filled 2022-06-09: qty 50

## 2022-06-09 MED ORDER — EPINEPHRINE 1 MG/10ML IJ SOSY
PREFILLED_SYRINGE | INTRAMUSCULAR | Status: DC | PRN
  Administered 2022-06-09: .5 mg via INTRAVENOUS
  Administered 2022-06-09: .3 mg via INTRAVENOUS

## 2022-06-09 MED ORDER — HEPARIN (PORCINE) IN NACL 1000-0.9 UT/500ML-% IV SOLN
INTRAVENOUS | Status: DC | PRN
  Administered 2022-06-09: 1000 mL

## 2022-06-09 MED ORDER — SODIUM CHLORIDE 0.9 % IV SOLN
4.0000 ug/kg/min | INTRAVENOUS | Status: DC
  Filled 2022-06-09: qty 50

## 2022-06-09 MED ORDER — PROPOFOL 1000 MG/100ML IV EMUL: 5.0000 ug/kg/min | INTRAVENOUS | Status: DC

## 2022-06-09 MED ORDER — POLYETHYLENE GLYCOL 3350 17 G PO PACK
17.0000 g | PACK | Freq: Every day | ORAL | Status: DC
  Administered 2022-06-09: 17 g
  Filled 2022-06-09: qty 1

## 2022-06-09 MED ORDER — TICAGRELOR 90 MG PO TABS
180.0000 mg | ORAL_TABLET | Freq: Once | ORAL | Status: AC
  Administered 2022-06-09: 180 mg
  Filled 2022-06-09: qty 2

## 2022-06-09 MED ORDER — NOREPINEPHRINE 4 MG/250ML-% IV SOLN
0.0000 ug/min | INTRAVENOUS | Status: DC
  Administered 2022-06-09: 40 ug/min via INTRAVENOUS
  Administered 2022-06-09: 10 ug/min via INTRAVENOUS
  Administered 2022-06-09: 40 ug/min via INTRAVENOUS
  Filled 2022-06-09 (×3): qty 250

## 2022-06-09 MED ORDER — AMIODARONE LOAD VIA INFUSION
150.0000 mg | Freq: Once | INTRAVENOUS | Status: AC
  Filled 2022-06-09: qty 83.34

## 2022-06-09 MED ORDER — DOCUSATE SODIUM 50 MG/5ML PO LIQD
100.0000 mg | Freq: Two times a day (BID) | ORAL | Status: DC
  Administered 2022-06-09: 100 mg
  Filled 2022-06-09 (×2): qty 10

## 2022-06-09 MED ORDER — AMIODARONE HCL IN DEXTROSE 360-4.14 MG/200ML-% IV SOLN
INTRAVENOUS | Status: AC
  Administered 2022-06-09: 150 mg via INTRAVENOUS
  Filled 2022-06-09: qty 200

## 2022-06-09 MED ORDER — CALCIUM CHLORIDE 10 % IV SOLN
INTRAVENOUS | Status: AC
  Administered 2022-06-09: 2000 mg
  Filled 2022-06-09: qty 20

## 2022-06-09 MED ORDER — CHLORHEXIDINE GLUCONATE CLOTH 2 % EX PADS
6.0000 | MEDICATED_PAD | Freq: Every day | CUTANEOUS | Status: DC
  Administered 2022-06-09: 6 via TOPICAL

## 2022-06-09 MED ORDER — HEPARIN SODIUM (PORCINE) 5000 UNIT/ML IJ SOLN: 5000.0000 [IU] | Freq: Three times a day (TID) | INTRAMUSCULAR | Status: DC

## 2022-06-09 MED ORDER — FAMOTIDINE 20 MG PO TABS
20.0000 mg | ORAL_TABLET | Freq: Every day | ORAL | Status: DC
  Filled 2022-06-09: qty 1

## 2022-06-09 MED ORDER — AMIODARONE HCL IN DEXTROSE 360-4.14 MG/200ML-% IV SOLN: 30.0000 mg/h | INTRAVENOUS | Status: DC

## 2022-06-09 MED ORDER — IOHEXOL 350 MG/ML SOLN
INTRAVENOUS | Status: DC | PRN
  Administered 2022-06-09: 195 mL

## 2022-06-09 MED ORDER — MIDAZOLAM HCL 2 MG/2ML IJ SOLN
INTRAMUSCULAR | Status: AC
  Filled 2022-06-09: qty 2

## 2022-06-09 MED ORDER — PIPERACILLIN-TAZOBACTAM 3.375 G IVPB
3.3750 g | Freq: Four times a day (QID) | INTRAVENOUS | Status: DC
  Administered 2022-06-09: 3.375 g via INTRAVENOUS
  Filled 2022-06-09 (×3): qty 50

## 2022-06-09 MED ORDER — PRISMASOL BGK 4/2.5 32-4-2.5 MEQ/L EC SOLN: Status: DC

## 2022-06-09 MED ORDER — LIDOCAINE IN D5W 4-5 MG/ML-% IV SOLN
INTRAVENOUS | Status: AC
  Filled 2022-06-09: qty 500

## 2022-06-09 MED ORDER — PIPERACILLIN-TAZOBACTAM IN DEX 2-0.25 GM/50ML IV SOLN
2.2500 g | Freq: Three times a day (TID) | INTRAVENOUS | Status: DC
  Filled 2022-06-09 (×4): qty 50

## 2022-06-09 MED ORDER — CALCIUM ACETATE (PHOS BINDER) 667 MG PO CAPS
1334.0000 mg | ORAL_CAPSULE | Freq: Three times a day (TID) | ORAL | Status: DC
  Administered 2022-06-09: 1334 mg via ORAL
  Filled 2022-06-09: qty 2

## 2022-06-09 MED ORDER — HYDROMORPHONE HCL 1 MG/ML IJ SOLN: 0.5000 mg | Freq: Once | INTRAMUSCULAR | Status: DC

## 2022-06-09 MED ORDER — INSULIN ASPART 100 UNIT/ML IJ SOLN
0.0000 [IU] | INTRAMUSCULAR | Status: DC
  Administered 2022-06-09: 5 [IU] via SUBCUTANEOUS

## 2022-06-09 MED ORDER — EPINEPHRINE HCL 5 MG/250ML IV SOLN IN NS
0.5000 ug/min | INTRAVENOUS | Status: DC
  Administered 2022-06-09: 0.5 ug/min via INTRAVENOUS
  Filled 2022-06-09: qty 250

## 2022-06-09 MED ORDER — LIDOCAINE IN D5W 4-5 MG/ML-% IV SOLN: 1.0000 mg/min | INTRAVENOUS | Status: DC

## 2022-06-09 SURGICAL SUPPLY — 21 items
BALLN SAPPHIRE 2.5X15 (BALLOONS) ×1
BALLN SCOREFLEX 2.50X15 (BALLOONS) ×2
BALLN ~~LOC~~ EMERGE MR 2.5X20 (BALLOONS) ×1
BALLOON SAPPHIRE 2.5X15 (BALLOONS) IMPLANT
BALLOON SCOREFLEX 2.50X15 (BALLOONS) IMPLANT
BALLOON ~~LOC~~ EMERGE MR 2.5X20 (BALLOONS) IMPLANT
CATH INFINITI 5FR MULTPACK ANG (CATHETERS) IMPLANT
CATH SHOCKWAVE C2 2.5X12 (CATHETERS) IMPLANT
CATH VISTA GUIDE 6FR XBLAD3.5 (CATHETERS) IMPLANT
KIT ENCORE 26 ADVANTAGE (KITS) IMPLANT
KIT HEART LEFT (KITS) ×1 IMPLANT
KIT MICROPUNCTURE NIT STIFF (SHEATH) IMPLANT
PACK CARDIAC CATHETERIZATION (CUSTOM PROCEDURE TRAY) ×1 IMPLANT
SHEATH PINNACLE 4F 10CM (SHEATH) IMPLANT
SHEATH PINNACLE 6F 10CM (SHEATH) IMPLANT
SHEATH PROBE COVER 6X72 (BAG) IMPLANT
TRANSDUCER W/STOPCOCK (MISCELLANEOUS) ×1 IMPLANT
TUBING CIL FLEX 10 FLL-RA (TUBING) ×1 IMPLANT
VALVE GUARDIAN II ~~LOC~~ HEMO (MISCELLANEOUS) IMPLANT
WIRE EMERALD 3MM-J .035X150CM (WIRE) IMPLANT
WIRE RUNTHROUGH .014X180CM (WIRE) IMPLANT

## 2022-06-10 ENCOUNTER — Encounter (HOSPITAL_COMMUNITY): Payer: Self-pay | Admitting: Cardiovascular Disease

## 2022-06-10 MED FILL — Nitroglycerin IV Soln 100 MCG/ML in D5W: INTRA_ARTERIAL | Qty: 10 | Status: AC

## 2022-06-14 ENCOUNTER — Other Ambulatory Visit (HOSPITAL_COMMUNITY): Payer: Medicare Other

## 2022-06-14 LAB — CULTURE, BLOOD (ROUTINE X 2)
Culture: NO GROWTH
Culture: NO GROWTH
Special Requests: ADEQUATE

## 2022-06-18 NOTE — Procedures (Addendum)
Intubation Procedure Note  Rachel Hobbs  [id number]  04/15/1941  Date:06/14/2022  Time:1:11 PM   Provider Performing: Nurse practitioner student's   Procedure: Intubation (31500)  Indication(s) Respiratory Failure  Consent Risks of the procedure as well as the alternatives and risks of each were explained to the patient and/or caregiver.  Consent for the procedure was obtained and is signed in the bedside chart   Anesthesia Etomidate and Rocuronium   Time Out Verified patient identification, verified procedure, site/side was marked, verified correct patient position, special equipment/implants available, medications/allergies/relevant history reviewed, required imaging and test results available.   Sterile Technique Usual hand hygeine, masks, and gloves were used   Procedure Description Patient positioned in bed supine.  Sedation given as noted above.  Patient was intubated with endotracheal tube using Glidescope.  View was Grade 1 full glottis .  Number of attempts was 1.  Colorimetric CO2 detector was consistent with tracheal placement.   Complications/Tolerance None; patient tolerated the procedure well. Chest X-ray is ordered to verify placement.   EBL Minimal   Specimen(s) None   I directly supervised nurse practitioner student performing endotracheal intubation, I assisted directly at bedside    Jacky Kindle, MD [place] for pager If no response to pager, please call [phone] until 7pm After 7pm, Please call E-link [phone]

## 2022-06-18 NOTE — Progress Notes (Signed)
PT Cancellation Note  Patient Details Name: Rachel Hobbs MRN: [id number] DOB: [birth year]   Cancelled Treatment:    Reason Eval/Treat Not Completed: Medical issues which prohibited therapy. Pt requiring intubation, in cardiogenic shock. PT will sign off at this time. Please re-consult when medically appropriate.   Zenaida Niece 06/03/2022, 1:27 PM

## 2022-06-18 NOTE — Procedures (Signed)
Cardiopulmonary Resuscitation Note  Rachel Hobbs  [id number]  1941/03/24  Date:05/28/2022  Time:7:28 PM   Provider Performing:Sandip Power   Procedure: Cardiopulmonary Resuscitation ([id number])  Indication(s) Loss of Pulse  Consent N/A  Anesthesia N/A   Time Out N/A   Sterile Technique Hand hygiene, gloves   Procedure Description Called to patient's room for CODE BLUE. Initial rhythm was Vfib/Vtach. Patient received high quality chest compressions for 2 minutes with defibrillation or cardioversion when appropriate. Epinephrine was administered every 3 minutes as directed by time Therapist, nutritional. Additional pharmacologic interventions included amiodarone, calcium chloride, magnesium, sodium bicarbonate, vasopressin, and lidocaine. Additional procedural interventions include intra-osseus line.  Return of spontaneous circulation was achieved.  Family called and notified.   Complications/Tolerance N/A   EBL N/A   Specimen(s) N/A  Estimated time to ROSC: 6mnutes

## 2022-06-18 NOTE — Progress Notes (Signed)
  Patient asystole at 2116/07/18. Auscultated heart and lungs sounds for 2 minutes, no sounds noted. Second RN verified: York Cerise RN. And Fransisco Beau RN.Marland Kitchen Pt pronounced at Jul 19, 2119 on 06-30-22.

## 2022-06-18 NOTE — ED Notes (Signed)
Dr. Madison Hickman notified of latest troponin and lactic values

## 2022-06-18 NOTE — Progress Notes (Signed)
FMTS Interim Progress Note  S: Received page by RN approximately 4:30 AM regarding patient experiencing some nausea and anxiety.  Zofran ODT 8 mg given.  Approximately 1 hour later, paged by same RN stating patient had been experiencing bradycardia events on telemetry and is still appearing quite anxious although no further nausea.  I went to see patient with Dr. Nelda Bucks.  She denies any pain but states she is having difficulty breathing.  She wanted to sit up and then lay down several times.  She would not voice distinctly what she was feeling although upon significant questioning, continued to decline pain or discomfort in any location.  O: BP (!) 101/56   Pulse 64   Temp 98 F (36.7 C)   Resp (!) 21   Ht 5' 2"$  (1.575 m)   Wt 94.8 kg   SpO2 96%   BMI 38.23 kg/m   General: Diaphoretic, uncomfortable appearing, conversationally appropriate CV: Difficult to appreciate, no murmurs auscultated, 2+ radial pulse Pulm: Clear to auscultation bilaterally, shallow tachypneic breaths, no increased work of breathing on 2 L [place] Abdomen: Soft, nontender, enlarged left side of abdomen with known hernia  A/P: Dyspnea Differential includes but is not limited to ACS (this is most likely given elevated troponin), anxiety/panic attack, PE (has received DVT prophylaxis since admission, unlikely to have developed acutely), pneumothorax (no acute event in hospital), acute bleed (no active signs of bleeding).  Patient did have several episodes of sinus bradycardia as low as heart rate in the 30s lasting between 10 to 30 seconds.  Repeat EKG shows junctional rhythm.  Discussed with on-call cardiology fellow Dr. Humphrey Rolls in addition to ordering troponin, VBG, chest and abdominal XR.  Advised to add lactic acid.  Patient will also have her morning labs of BMP and CBC drawn at this time.  He notes that she will be seen earlier than originally planned but no further interventional measures at this time.  Will continue to  monitor and make changes depending upon lab and imaging results.  Wells Guiles, DO 05/29/2022, 5:38 AM PGY-2, [facility] pager [phone]

## 2022-06-18 NOTE — Progress Notes (Signed)
ANTICOAGULATION CONSULT NOTE - Initial Consult  Pharmacy Consult for Heparin Indication: chest pain/ACS  No Known Allergies  Patient Measurements: Height: 5' 2"$  (157.5 cm) Weight: 94.8 kg (209 lb) IBW/kg (Calculated) : 50.1 Heparin Dosing Weight: HEPARIN DW (KG): 72.3   Vital Signs: Temp: 97.5 F (36.4 C) (02/21 0740) Temp Source: Oral (02/21 0740) BP: 108/90 (02/21 0825) Pulse Rate: 62 (02/21 0545)  Labs: Recent Labs    05/23/2022 1605 05/31/2022 1755 05/23/2022 0548 05/29/2022 0549 06/11/2022 0630  HGB 12.9  --  13.7 14.6  --   HCT 38.9  --  42.4 43.0  --   PLT 157  --  158  --   --   CREATININE 6.29*  --   --   --  8.10*  TROPONINIHS 373* 388* 863*  --  1,390*    Estimated Creatinine Clearance: 5.8 mL/min (A) (by C-G formula based on SCr of 8.1 mg/dL (H)).   Medical History: Past Medical History:  Diagnosis Date   Anemia of renal disease 06/16/2006   Qualifier: Diagnosis of  By: Marinell Blight, Dawn     Arthritis    CHF (congestive heart failure) ([place])    COVID 2021   Depression    Diabetes mellitus    type 2   Hernia    Hyperlipidemia    Hypertension    Iron deficiency anemia    LEG EDEMA, BILATERAL 12/03/2008   Qualifier: Diagnosis of  By: Carlena Sax  MD, [place]     Low iron    Nodule of soft tissue 06/22/2012   Peripheral vascular disease ([place])    in legs   Pneumonia    teenager   Renal disorder    CKD - dialysis T/TH/Sa   Shortness of breath dyspnea    with exertion   SMALL BOWEL OBSTRUCTION, HX OF 02/15/2007   Qualifier: Diagnosis of  By: [name], HEIDI     Type 2 diabetes mellitus with diabetic peripheral angiopathy without gangrene ([place]) 12/16/2015    Medications:  APAP, ASA81, Atorva40, PhosLo, Imdur30, Lantus 40 units qhs  **not taking Plavix PTA  Assessment: 82 yo F admitted with worsening L shoulder pain.  Pt is ESRD with last HD session 2/20 (only partial - signed off early due to pain).  While in ED noted to have hypotension and  bradycardia with troponin 300s > 800s.  CT neg for PE.  To start heparin for r/o ACS.  Of note, patient received SQ heparin ~ 0600.  Goal of Therapy:  Heparin level 0.3-0.7 units/ml Monitor platelets by anticoagulation protocol: Yes   Plan:  Heparin 2000 units IV x 1 (1/2 bolus with recent Hep SQ admin) Heparin infusion at 1000 units/hr Heparin level in 8hrs  Heparin level and CBC daily  [employer], Pharm.D., BCPS Clinical Pharmacist Clinical phone for 06/15/2022 from 7:30-3:00 is [phone].  **Pharmacist phone directory can be found on [email or web address] listed under [place].  06/13/2022 9:03 AM

## 2022-06-18 NOTE — Assessment & Plan Note (Addendum)
WBC elevated to 16.4 today. Patient is afebrile. Could be due to acute stress from ACS. Does not seem to have any concerning sites of infection. Left arm fistula not warm or tender, no focal lung sounds, though she does have increased oxygen requirement. Ventral hernia not painful; however, difficult to reduce.  - AM CBC  - Monitor fever curve  - Consult gen surg for evaluation of ventral hernia

## 2022-06-18 NOTE — Progress Notes (Addendum)
Daily Progress Note Intern Pager: [phone]  Patient name: Rachel Hobbs Medical record number: [id number] Date of birth: Mar 07, 1941 Age: 82 y.o. Gender: female  Primary Care Provider: Arlyce Dice, MD Consultants: Cardiology  Code Status: Full   Pt Overview and Major Events to Date:  2/20 - admitted  2/21 - started heparin gtt   Assessment and Plan: Rachel Hobbs is a 82 y.o. female who presented with acute on chronic left shoulder pain and shortness of breath. Now with 200% elevation in troponin and concern for ACS.   * Elevated troponin Admitted for ACS r/o and observation. Cardiology initially believed troponinemia at 300s due to demand ischemia unlikely ACS as troponin elevations are normal in ESRD. However now troponin increased [phone]  with some mild hypotension, now with more concern for ACS. Lipid panel very normal. Last echo in 11/2020 LVEF of 66% and grade 1 diastolic dysfunction.  - Cardiology following, appreciate recs - Continue Hepatin gtt  - Pending echocardiogram - F/u ABG, lactic acid  - Cardiac telemetry - Continue aspirin 81 mg daily, plavix 75 mg, atorvastatin 40 mg daily - Hold imdur in the setting of soft blood pressures  - Try one dose of 0.5 mg dilaudid   Leukocytosis WBC elevated to 16.4 today. Patient is afebrile. Could be due to acute stress from ACS. Does not seem to have any concerning sites of infection. Left arm fistula not warm or tender, no focal lung sounds, though she does have increased oxygen requirement. Ventral hernia not painful; however, difficult to reduce.  - AM CBC  - Monitor fever curve  - Consult gen surg for evaluation of ventral hernia   Left shoulder pain Could have referred pain from heart or similar to chronic MSK pain.  -PT/OT eval and treat -Pain management with Tylenol, Voltaren gel, lidocaine patch  ESRD (end stage renal disease) ([place]) Schedule of T/TH/SA. Had 2 hours of dialysis yesterday.  - Consult nephrology to  resume dialysis   DM type 2, controlled, with complication (HCC) [id number] 7.1 six months ago.  Glucose 211.  Takes 42 unites Lantus at night.  - Start 5 u long acting. - SSI sensitive for ESRD  - CBG with meals   FEN/GI: renal with fluid restriction PPx: heparin  Dispo: pending clinical improvement   Subjective:  Patient endorses significant shortness of breath. Denies chest pain, abdominal pain, nausea and vomiting.   Objective: Temp:  [97.5 F (36.4 C)-98.9 F (37.2 C)] 97.5 F (36.4 C) (02/21 0740) Pulse Rate:  [62-105] 84 (02/21 0815) Resp:  [13-34] 27 (02/21 0945) BP: (77-149)/(33-99) 95/33 (02/21 0945) SpO2:  [93 %-99 %] 98 % (02/21 0815) Weight:  [94.8 kg] 94.8 kg (02/20 1455) Physical Exam: General: Appears very uncomfortable Cardiovascular: Distant heart sounds, soft pulses, slightly colder hands and feet, trace BLE edema  Respiratory: diaphoretic, increased work of breathing, tachypnea, CTAB though difficult to auscultate due to positioning and pain  Abdomen: Soft, protuberant with large left sided ventral hernia not painful  Left arm: fistula without warmth, or pain   Laboratory: Most recent CBC Lab Results  Component Value Date   WBC 16.4 (H) 06/15/2022   HGB 14.6 06/02/2022   HCT 43.0 06/02/2022   MCV 109.8 (H) 05/22/2022   PLT 158 06/13/2022   Most recent BMP    Latest Ref Rng & Units 05/24/2022    6:30 AM  BMP  Glucose 70 - 99 mg/dL 252   BUN 8 - 23 mg/dL 38  Creatinine 0.44 - 1.00 mg/dL 8.10   Sodium 135 - 145 mmol/L 133   Potassium 3.5 - 5.1 mmol/L 5.5   Chloride 98 - 111 mmol/L 91   CO2 22 - 32 mmol/L 16   Calcium 8.9 - 10.3 mg/dL 8.9     Troponin 863   DG Abdomen Acute 2 + V W 1V Chest  1. No evidence of bowel obstruction or free intraperitoneal air. 2. Large bowel containing left ventral abdominal wall hernia. 3. Small left pleural effusion developing with overlying left basilar opacity which could be atelectasis or consolidation. 4.  Mild cardiomegaly without evidence of CHF. 5. Aortic atherosclerosis.   Lowry Ram, MD 06/16/2022, 10:29 AM  PGY-1, [place] Intern pager: [phone], text pages welcome Secure chat group [place]

## 2022-06-18 NOTE — Death Summary Note (Signed)
DEATH SUMMARY   Patient Details  Name: Rachel Hobbs MRN: [id number] DOB: June 18, 1940  Admission/Discharge Information   Admit Date:  2022/06/25  Date of Death: Date of Death: 06/26/2022  Time of Death: Time of Death: 07/15/19  Length of Stay: 1  Referring Physician: Arlyce Dice, MD   Reason(s) for Hospitalization  Acute systolic CHF with refractory cardiogenic shock Acute anterior wall NSTEMI s/p PCI of LAD Acute hypoxic/hypercapnic respiratory failure End-stage renal disease on hemodialysis Acute metabolic acidosis/lactic acidosis Diabetes type 2 with hyperglycemia Hyponatremia/hyperkalemia/hypocalcemia/hyperphosphatemia Recurrent and V-fib s/p defibrillation x 5  Diagnoses  Preliminary cause of death: Refractory cardiogenic shock and recurrent V-fib Secondary Diagnoses (including complications and co-morbidities):  Principal Problem:   Elevated troponin Active Problems:   DM type 2, controlled, with complication (HCC)   Shortness of breath   ESRD (end stage renal disease) (HCC)   Left shoulder pain   Leukocytosis   Cardiogenic shock (HCC)   Cardiac arrest, cause unspecified [facility])   Brief Hospital Course (including significant findings, care, treatment, and services provided and events leading to death)  Rachel Hobbs is a 82 y.o. year old female with significant pmhx of HFrEF, DM2, Hyperlipidemia, HTN, PVD, end-stage disease on hemodialysis presented with acute left shoulder pain with SOB on 06/25/2022 after receiving HD for approximately 2 hrs. Initial troponin's were within the high 300s and thought to be demand ischemia per cardiology. However, troponin's increased from 388 to 863 to 1390 with EKG changes confirming ACS. Patient began to have increased WOB, SOB, and hypotension. PCCM was then consulted to assist with decompensating hemodynamic status.   Patient remained hypotensive, he was started on IV vasopressor support, cardiology was consulted they recommend proceeding with  cardiac cath, patient continued to complain of shortness of breath, she became hypoxic, she was intubated and placed on mechanical ventilation, vent setting was adjusted to clear hypercapnia On cardiac cath patient was noted to have complete occlusion of the LAD, PCI was performed with improvement flow in proximal LAD.  Post PCI patient was admitted to ICU, she went into V-fib cardiac arrest, ROSC was achieved after 2 minutes, then she was going in and out of V-fib, received shock x 5.  She received multiple boluses of bicarbonate, calcium chloride and amiodarone.  Cardiology was following, they recommend starting lidocaine but she continued to have V-fib despite everything was done.  Her lactate remain > 9 she was started on CRRT but she could not tolerate due to profound shock Goals of care discussions were carried with family, patient's family decided to keep her DNR and continue current care, due to profound shock she went into asystole on 2022-06-27 at 9:21 AM and was declared dead.  Patient family was called and notified  Pertinent Labs and Studies  Significant Diagnostic Studies DG Chest Port 1 View  Result Date: 06/26/2022 CLINICAL DATA:  Endotracheal tube placement EXAM: PORTABLE CHEST 1 VIEW COMPARISON:  06/26/22 at 6:28 a.m. FINDINGS: An endotracheal tube has been placed with tip measuring 1.2 cm above the carina. Right central venous catheter is unchanged with tip positioned over the lower SVC. An enteric tube is been placed. Tip is off the field of view but below the left hemidiaphragm. Heart size and pulmonary vascularity are normal for technique. Shallow inspiration. Probable atelectasis in the left lung base. Probable small bilateral pleural effusions. No pneumothorax. Calcification of the aorta. Degenerative changes in the spine and shoulders. IMPRESSION: Endotracheal tube positioned with tip measuring 1.2 cm above the carina. Shallow  inspiration with atelectasis in the left base and small  bilateral pleural effusions. Electronically Signed   By: Rachel Hobbs M.D.   On: 05/24/2022 15:32   CARDIAC CATHETERIZATION  Result Date: 06/06/2022   Dist LAD lesion is 100% stenosed.   Dist LM lesion is 20% stenosed.   Ramus lesion is 40% stenosed.   Ost LAD to Mid LAD lesion is 99% stenosed.   Scoring balloon angioplasty was performed using a BALLN SCOREFLEX 2.50X15.   Post intervention, there is a 40% residual stenosis. 1.  Heavily calcified codominant coronary arteries.  Occluded distal LAD after third diagonal seems to be chronic with collaterals from septal perforators as well as the right coronary artery.  Subtotal occlusion of the ostial LAD is likely acute on chronic and most likely the culprit.  No obstructive disease involving the left circumflex, ramus and right coronary arteries. 2.  Severely reduced LV systolic function by echo.  Left ventricular angiography was not performed.  Severely elevated left ventricular end-diastolic pressure at 31 mmHg. 3.  Successful balloon angioplasty and intravascular lithotripsy of the proximal/ostial LAD.  The location was not optimal for stent placement and I was also concerned about degree of calcifications and poor outflow. Recommendations: The patient was started on cangrelor to be continued until a loading dose of Brilinta is given. Continue supportive care. Both arterial and venous sheaths were sutured in the right groin given the lack of vascular access. The patient is not a candidate for mechanical support devices given age, comorbidities and calcified peripheral arteries.   ECHOCARDIOGRAM COMPLETE  Result Date: 05/20/2022    ECHOCARDIOGRAM REPORT   Patient Name:   Rachel Hobbs Date of Exam: 05/22/2022 Medical Rec #:  [id number]     Height:       62.0 in Accession #:    [id number]    Weight:       209.0 lb Date of Birth:  April 28, 1940      BSA:          1.948 m Patient Age:    27 years      BP:           91/54 mmHg Patient Gender: F             HR:            57 bpm. Exam Location:  Inpatient Procedure: 2D Echo, Cardiac Doppler and Color Doppler Indications:     Elevated Troponin  History:         Patient has prior history of Echocardiogram examinations.                  Signs/Symptoms:Dyspnea; Risk Factors:Diabetes, Hypertension and                  Dyslipidemia.  Sonographer:     Danne Baxter RDCS, FE, PE Referring Phys:  [place] Diagnosing Phys: Skeet Latch MD IMPRESSIONS  1. Global hypokinesis with mid-apical anterior and apical akinesis. Left ventricular ejection fraction, by estimation, is 20 to 25%. The left ventricle has severely decreased function. The left ventricle demonstrates global hypokinesis. There is mild concentric left ventricular hypertrophy. Left ventricular diastolic parameters are consistent with Grade I diastolic dysfunction (impaired relaxation).  2. Right ventricular systolic function is normal. The right ventricular size is normal. There is normal pulmonary artery systolic pressure.  3. The mitral valve is normal in structure. No evidence of mitral valve regurgitation. No evidence of mitral stenosis.  4. The  aortic valve is normal in structure. Aortic valve regurgitation is not visualized. No aortic stenosis is present.  5. The inferior vena cava is dilated in size with <50% respiratory variability, suggesting right atrial pressure of 15 mmHg. FINDINGS  Left Ventricle: Global hypokinesis with mid-apical anterior and apical akinesis. Left ventricular ejection fraction, by estimation, is 20 to 25%. The left ventricle has severely decreased function. The left ventricle demonstrates global hypokinesis. The  left ventricular internal cavity size was normal in size. There is mild concentric left ventricular hypertrophy. Left ventricular diastolic parameters are consistent with Grade I diastolic dysfunction (impaired relaxation). Right Ventricle: The right ventricular size is normal. No increase in right ventricular wall  thickness. Right ventricular systolic function is normal. There is normal pulmonary artery systolic pressure. The tricuspid regurgitant velocity is 1.95 m/s, and  with an assumed right atrial pressure of 15 mmHg, the estimated right ventricular systolic pressure is 123XX123 mmHg. Left Atrium: Left atrial size was normal in size. Right Atrium: Right atrial size was normal in size. Pericardium: There is no evidence of pericardial effusion. Mitral Valve: The mitral valve is normal in structure. No evidence of mitral valve regurgitation. No evidence of mitral valve stenosis. Tricuspid Valve: The tricuspid valve is normal in structure. Tricuspid valve regurgitation is not demonstrated. No evidence of tricuspid stenosis. Aortic Valve: The aortic valve is normal in structure. Aortic valve regurgitation is not visualized. No aortic stenosis is present. Pulmonic Valve: The pulmonic valve was normal in structure. Pulmonic valve regurgitation is not visualized. No evidence of pulmonic stenosis. Aorta: The aortic root is normal in size and structure. Venous: The inferior vena cava is dilated in size with less than 50% respiratory variability, suggesting right atrial pressure of 15 mmHg. IAS/Shunts: No atrial level shunt detected by color flow Doppler.  LEFT VENTRICLE PLAX 2D LVIDd:         3.90 cm   Diastology LVIDs:         3.40 cm   LV e' medial:    3.60 cm/s LV PW:         1.10 cm   LV E/e' medial:  18.8 LV IVS:        1.10 cm   LV e' lateral:   2.33 cm/s LVOT diam:     1.80 cm   LV E/e' lateral: 29.0 LVOT Area:     2.54 cm  RIGHT VENTRICLE RV S prime:     7.48 cm/s TAPSE (M-mode): 1.0 cm  AORTA Ao Root diam: 2.50 cm Ao Asc diam:  2.80 cm MITRAL VALVE               TRICUSPID VALVE MV Area (PHT): 2.87 cm    TR Peak grad:   15.2 mmHg MV Decel Time: 264 msec    TR Vmax:        195.00 cm/s MV E velocity: 67.65 cm/s MV A velocity: 75.20 cm/s  SHUNTS MV E/A ratio:  0.90        Systemic Diam: 1.80 cm Skeet Latch MD  Electronically signed by Skeet Latch MD Signature Date/Time: 05/25/2022/1:58:26 PM    Final (Updated)    DG ABD ACUTE 2+V W 1V CHEST  Result Date: 06/05/2022 CLINICAL DATA:  [id number] dyspnea. Shortness of breath. CHF. Large abdominal hernia. EXAM: DG ABDOMEN ACUTE WITH 1 VIEW CHEST COMPARISON:  CT with oral contrast 01/15/2022, AP Lat chest yesterday at 3:26 p.m. FINDINGS: AP chest and supine and left lateral decubitus abdomen films, 6:35 a.m.: There  is no evidence of dilated bowel loops or free intraperitoneal air. Large bowel containing left ventral abdominal wall hernia is again noted. There is a 9 mm gallstone again seen in the right upper abdomen. No radiopaque urinary calculi or other acute radiographic abnormality is seen, with again noted extensive vascular calcifications. There is mild cardiomegaly without evidence of CHF. There is aortic atherosclerosis and slight tortuosity with stable mediastinum. Right IJ dialysis catheter terminates at about the superior cavoatrial junction. There is a small left pleural effusion developing with overlying left basilar opacity which could be atelectasis or consolidation. The remaining lungs remain clear. There are vascular stents in the left axilla. IMPRESSION: 1. No evidence of bowel obstruction or free intraperitoneal air. 2. Large bowel containing left ventral abdominal wall hernia. 3. Small left pleural effusion developing with overlying left basilar opacity which could be atelectasis or consolidation. 4. Mild cardiomegaly without evidence of CHF. 5. Aortic atherosclerosis. Electronically Signed   By: Telford Nab M.D.   On: 06/11/2022 07:15   CT Angio Chest Pulmonary Embolism (PE) W or WO Contrast  Result Date: 06/07/2022 CLINICAL DATA:  82 year old female with persistent left shoulder pain. EXAM: CT ANGIOGRAPHY CHEST WITH CONTRAST TECHNIQUE: Multidetector CT imaging of the chest was performed using the standard protocol during bolus administration of  intravenous contrast. Multiplanar CT image reconstructions and MIPs were obtained to evaluate the vascular anatomy. RADIATION DOSE REDUCTION: This exam was performed according to the departmental dose-optimization program which includes automated exposure control, adjustment of the mA and/or kV according to patient size and/or use of iterative reconstruction technique. CONTRAST:  64m OMNIPAQUE IOHEXOL 350 MG/ML SOLN COMPARISON:  Chest and left shoulder radiographs yesterday. CT Abdomen and Pelvis 01/15/2022. FINDINGS: Cardiovascular: Excellent contrast bolus timing in the pulmonary arterial tree. Mild respiratory motion. No pulmonary artery filling defect. Calcified coronary artery and Calcified aortic atherosclerosis. With little to no contrast in the aorta. Mild contrast reflux into the hepatic veins and hepatic IVC. But only mild cardiomegaly. No pericardial effusion. Right IJ approach dual lumen dialysis type catheter in place with evidence of right innominate venous stenosis and numerous enhancing right chest wall venous collaterals. Superimposed left axillary vascular grafts and/or stents, not evaluated as there is no left upper extremity vascular contrast present. Mediastinum/Nodes: Negative. No mediastinal mass or lymphadenopathy. Lungs/Pleura: Major airways are patent with mild atelectatic changes. Fairly symmetric dependent mild pulmonary opacity most resembles atelectasis. No convincing pleural effusion. No pneumothorax, pulmonary edema, or convincing pulmonary inflammation. Upper Abdomen: Negative visible noncontrast upper abdomen except for some contrast reflux into the liver. Musculoskeletal: Partially visible bulky lower cervical spine endplate degeneration. T1 spina bifida occulta, normal variant. Preserved upper thoracic disc spaces. Lower thoracic vacuum disc degeneration. Left ribs appear intact. Visible left shoulder osseous structures are intact. Contralateral right shoulder more pronounced  glenohumeral joint space loss and degeneration. No acute osseous abnormality identified. Review of the MIP images confirms the above findings. IMPRESSION: 1. Negative for acute pulmonary embolus. 2. Mild pulmonary atelectasis. No other acute finding in the Chest. 3. Right side central venous stenosis related to dialysis catheter. Vascular stents or grafts in the left upper extremity. Extensive coronary artery and Aortic Atherosclerosis (ICD10-I70.0). Electronically Signed   By: HGenevie AnnM.D.   On: 06/14/2022 06:42   DG Chest 2 View  Result Date: 06/05/2022 CLINICAL DATA:  Left shoulder pain.  Neck crepitus. EXAM: CHEST - 2 VIEW COMPARISON:  Chest radiographs 04/20/2022 and 09/12/2021 FINDINGS: Right internal jugular dual-lumen central venous catheter  tip again overlies the superior vena cava/right atrial junction. Cardiac silhouette is moderately enlarged. Mediastinal contours within normal limits. Moderate calcification within the aortic arch. The lungs appear clear. No pleural effusion or pneumothorax is seen. Moderate multilevel degenerative disc changes of the thoracic spine. There are two sets of vascular stents partially visualized within the left upper arm. IMPRESSION: Stable cardiomegaly.  No acute lung process. Electronically Signed   By: Yvonne Kendall M.D.   On: 05/25/2022 16:03   DG Shoulder Left  Result Date: 06/05/2022 CLINICAL DATA:  Shoulder pain with neck crepitus EXAM: LEFT SHOULDER - 2+ VIEW COMPARISON:  None Available. FINDINGS: Moderate AC joint degenerative change. Mild to moderate glenohumeral degenerative change. No fracture or malalignment. Narrowed appearing subacromial space. Stents in the left upper extremity IMPRESSION: Degenerative changes of the [facility] and glenohumeral joints. Narrowed subacromial space suggesting rotator cuff pathology. No acute osseous abnormality Electronically Signed   By: Donavan Foil M.D.   On: 05/26/2022 15:58   OCT, Retina - OU - Both Eyes  Result Date:  05/24/2022 Right Eye Quality was good. Scan locations included subfoveal. Central Foveal Thickness: 268. Progression has no prior data. Findings include normal foveal contour, no IRF, no SRF. Left Eye Quality was good. Scan locations included subfoveal. Central Foveal Thickness: 290. Progression has no prior data. Findings include normal foveal contour, no IRF, no SRF, intraretinal hyper-reflective material. Notes *Images captured and stored on drive Diagnosis / Impression: NFP; no IRF/SRF OU No DME OU Clinical management: See below Abbreviations: NFP - Normal foveal profile. CME - cystoid macular edema. PED - pigment epithelial detachment. IRF - intraretinal fluid. SRF - subretinal fluid. EZ - ellipsoid zone. ERM - epiretinal membrane. ORA - outer retinal atrophy. ORT - outer retinal tubulation. SRHM - subretinal hyper-reflective material. IRHM - intraretinal hyper-reflective material    Microbiology Recent Results (from the past 240 hour(s))  Resp panel by RT-PCR (RSV, Flu A&B, Covid) Anterior Nasal Swab     Status: None   Collection Time: 06/11/2022  6:26 AM   Specimen: Anterior Nasal Swab  Result Value Ref Range Status   SARS Coronavirus 2 by RT PCR NEGATIVE NEGATIVE Final   Influenza A by PCR NEGATIVE NEGATIVE Final   Influenza B by PCR NEGATIVE NEGATIVE Final    Comment: (NOTE) The Xpert Xpress SARS-CoV-2/FLU/RSV plus assay is intended as an aid in the diagnosis of influenza from Nasopharyngeal swab specimens and should not be used as a sole basis for treatment. Nasal washings and aspirates are unacceptable for Xpert Xpress SARS-CoV-2/FLU/RSV testing.  Fact Sheet for Patients: EntrepreneurPulse.com.au  Fact Sheet for Healthcare Providers: IncredibleEmployment.be  This test is not yet approved or cleared by the [place] FDA and has been authorized for detection and/or diagnosis of SARS-CoV-2 by FDA under an Emergency Use Authorization (EUA). This  EUA will remain in effect (meaning this test can be used) for the duration of the COVID-19 declaration under Section 564(b)(1) of the Act, 21 U.S.C. section 360bbb-3(b)(1), unless the authorization is terminated or revoked.     Resp Syncytial Virus by PCR NEGATIVE NEGATIVE Final    Comment: (NOTE) Fact Sheet for Patients: EntrepreneurPulse.com.au  Fact Sheet for Healthcare Providers: IncredibleEmployment.be  This test is not yet approved or cleared by the [place] FDA and has been authorized for detection and/or diagnosis of SARS-CoV-2 by FDA under an Emergency Use Authorization (EUA). This EUA will remain in effect (meaning this test can be used) for the duration of the COVID-19 declaration under  Section 564(b)(1) of the Act, 21 U.S.C. section 360bbb-3(b)(1), unless the authorization is terminated or revoked.  Performed at [facility], [place] [address] [place], [place] [address]   Blood culture (routine x 2)     Status: None (Preliminary result)   Collection Time: 05/29/2022 11:43 AM   Specimen: BLOOD  Result Value Ref Range Status   Specimen Description BLOOD SITE NOT SPECIFIED  Final   Special Requests   Final    BOTTLES DRAWN AEROBIC AND ANAEROBIC Blood Culture adequate volume   Culture   Final    NO GROWTH < 24 HOURS Performed at [facility], [place] [address] [place], [place] [address]    Report Status PENDING  Incomplete  Blood culture (routine x 2)     Status: None (Preliminary result)   Collection Time: 06/13/2022  3:15 PM   Specimen: BLOOD  Result Value Ref Range Status   Specimen Description BLOOD SITE NOT SPECIFIED  Final   Special Requests   Final    BOTTLES DRAWN AEROBIC ONLY Blood Culture results may not be optimal due to an inadequate volume of blood received in culture bottles   Culture   Final    NO GROWTH < 24 HOURS Performed at [facility], [place] [address], [place], [place] [address]    Report  Status PENDING  Incomplete    Lab Basic Metabolic Panel: Recent Labs  Lab 06/12/2022 1605 06/05/2022 0549 05/29/2022 0630 05/23/2022 1038 06/05/2022 1341 06/01/2022 1537 05/24/2022 1609  NA 134*   < > 133* 127* 130* 127* 140  141  K 4.5   < > 5.5* 5.5* 4.6 5.7* 5.6*  5.6*  CL 93*  --  91*  --   --   --  88*  89*  CO2 28  --  16*  --   --   --  21*  22  GLUCOSE 98  --  252*  --   --   --  218*  221*  BUN 28*  --  38*  --   --   --  41*  42*  CREATININE 6.29*  --  8.10*  --   --   --  8.18*  8.19*  CALCIUM 8.7*  --  8.9  --   --   --  7.8*  7.8*  PHOS  --   --   --   --   --   --  9.1*   < > = values in this interval not displayed.   Liver Function Tests: Recent Labs  Lab 06/03/2022 1609  ALBUMIN 2.6*   No results for input(s): "LIPASE", "AMYLASE" in the last 168 hours. No results for input(s): "AMMONIA" in the last 168 hours. CBC: Recent Labs  Lab 06/05/2022 1605 05/30/2022 0548 06/15/2022 0549 05/30/2022 1038 05/31/2022 1341 05/29/2022 1537 06/16/2022 1609  WBC 6.4 16.4*  --   --   --   --  22.1*  HGB 12.9 13.7 14.6 13.6 13.9 13.6 12.2  HCT 38.9 42.4 43.0 40.0 41.0 40.0 39.5  MCV 106.0* 109.8*  --   --   --   --  113.5*  PLT 157 158  --   --   --   --  131*   Cardiac Enzymes: No results for input(s): "CKTOTAL", "CKMB", "CKMBINDEX", "TROPONINI" in the last 168 hours. Sepsis Labs: Recent Labs  Lab 05/25/2022 1605 05/27/2022 0548 06/15/2022 0859 06/07/2022 1609  WBC 6.4 16.4*  --  22.1*  LATICACIDVEN  --  8.8* >9.0* >9.0*    Procedures/Operations     Rayland Hamed 06-19-2022, 7:28 AM

## 2022-06-18 NOTE — Consult Note (Signed)
Renal Service Consult Note [facility]  Rachel Hobbs 06/06/2022 [name], MD Requesting Physician: Dr. Tacy Learn  Reason for Consult: ESRD pt w/ shoulder pain and hypotension HPI: The patient is a 82 y.o. year-old w/ PMH as below who presented w/ SOB and L shoulder pain. In ED SpO2 87% w/ O2. Not on home O2. CXR neg and EKG w/o acute changes. BP's 70s- 100s in ED, HR 60, RR 20-28.  Trop was 380 --> 1390. K+ 8.5--> repeat 5.5. ECHO showed EF < 20%.  WBC 16.4. LA 8.8 > 9. CTA neg for PE. Pt intubated by CCM in ED. Cardiology consulted, likely cardiogenic shock and recent MI. We are asked to see patient for ESRD.   Pt seen in ICU, pt sedated on the vent, no hx obtained      ROS - n/a   Past Medical History  Past Medical History:  Diagnosis Date   Anemia of renal disease 06/16/2006   Qualifier: Diagnosis of  By: Marinell Blight, Dawn     Arthritis    CHF (congestive heart failure) ([place])    COVID 2021   Depression    Diabetes mellitus    type 2   Hernia    Hyperlipidemia    Hypertension    Iron deficiency anemia    LEG EDEMA, BILATERAL 12/03/2008   Qualifier: Diagnosis of  By: Carlena Sax  MD, [place]     Low iron    Nodule of soft tissue 06/22/2012   Peripheral vascular disease ([place])    in legs   Pneumonia    teenager   Renal disorder    CKD - dialysis T/TH/Sa   Shortness of breath dyspnea    with exertion   SMALL BOWEL OBSTRUCTION, HX OF 02/15/2007   Qualifier: Diagnosis of  By: [name], HEIDI     Type 2 diabetes mellitus with diabetic peripheral angiopathy without gangrene ([place]) 12/16/2015   Past Surgical History  Past Surgical History:  Procedure Laterality Date   ABDOMINAL HYSTERECTOMY     in the 21's   AORTIC ARCH ANGIOGRAPHY N/A 12/14/2021   Procedure: AORTIC ARCH ANGIOGRAPHY;  Surgeon: [name], MD;  Location: [place] [facility];  Service: Cardiovascular;  Laterality: N/A;   AV FISTULA PLACEMENT Left 12/05/2015   Procedure:  RADIOCEPHALIC VERSUS BRACHIOCEPHALIC ARTERIOVENOUS (AV) FISTULA CREATION;  Surgeon: [name], MD;  Location: [place];  Service: Vascular;  Laterality: Left;   AV FISTULA PLACEMENT Left 05/20/2017   Procedure: INSERTION OF ARTERIOVENOUS (AV) GORE-TEX VASCULAR STRETCH 4-7 GRAFT ARM LEFT UPPER ARM;  Surgeon: [name], MD;  Location: [place];  Service: Vascular;  Laterality: Left;   AV FISTULA PLACEMENT Left 08/14/2021   Procedure: LEFT ARM ARTERIOVENOUS (AV) FISTULA CREATION;  Surgeon: [name], MD;  Location: [place];  Service: Vascular;  Laterality: Left;   [place] Left 06/04/2016   Procedure: FIRST STAGE BASILIC VEIN TRANSPOSITION;  Surgeon: [name], MD;  Location: [place];  Service: Vascular;  Laterality: Left;   BASCILIC VEIN TRANSPOSITION Left 08/04/2016   Procedure: LEFT UPPER ARM [place], SECOND STAGE;  Surgeon: [name], MD;  Location: [facility];  Service: Vascular;  Laterality: Left;   HERNIA REPAIR     umbilical in the [id number]   INSERTION OF DIALYSIS CATHETER Right 12/05/2015   Procedure: INSERTION OF DIALYSIS CATHETER;  Surgeon: [name], MD;  Location: [place];  Service: Vascular;  Laterality: Right;  IR THROMBECTOMY AV FISTULA W/THROMBOLYSIS/PTA INC/SHUNT/IMG LEFT Left 05/09/2019   IR US GUIDE VASC ACCESS LEFT  05/09/2019   LIGATION OF COMPETING BRANCHES OF ARTERIOVENOUS FISTULA Left 09/04/2021   Procedure: LIGATION OF LEFT ARM CEPHALIC VEIN;  Surgeon: [name], MD;  Location: [place];  Service: Vascular;  Laterality: Left;   PERIPHERAL VASCULAR BALLOON ANGIOPLASTY  12/14/2021   Procedure: PERIPHERAL VASCULAR BALLOON ANGIOPLASTY;  Surgeon: [name], MD;  Location: [place] [facility];  Service: Cardiovascular;;   UPPER EXTREMITY ANGIOGRAPHY Left 12/14/2021   Procedure: Upper Extremity Angiography;  Surgeon: [name], MD;  Location: [place] [facility];  Service:  Cardiovascular;  Laterality: Left;   UPPER EXTREMITY VENOGRAPHY Bilateral 06/15/2021   Procedure: UPPER EXTREMITY VENOGRAPHY;  Surgeon: [name], MD;  Location: [place] [facility];  Service: Cardiovascular;  Laterality: Bilateral;   Family History  Family History  Problem Relation Age of Onset   Kidney disease Mother    Hypertension Mother    COPD Father        smoke   Cancer Father        Lung   Hypertension Father    Kidney disease Sister    Diabetes Daughter    Social History  reports that she has never smoked. She has never been exposed to tobacco smoke. She has never used smokeless tobacco. She reports that she does not drink alcohol and does not use drugs. Allergies No Known Allergies Home medications Prior to Admission medications   Medication Sig Start Date End Date Taking? Authorizing Provider  acetaminophen (TYLENOL) 500 MG tablet Take 1,000 mg by mouth daily as needed for moderate pain.   Yes [provider]  aspirin 81 MG tablet Take 81 mg by mouth every other day.   Yes [provider]  atorvastatin (LIPITOR) 40 MG tablet TAKE 1 TABLET(40 MG) BY MOUTH DAILY AT 6 PM Strength: 40 mg 05/28/21  Yes Lurline Del, DO  calcium acetate (PHOSLO) 667 MG capsule Take 667-1,334 mg by mouth See admin instructions. Take 1334 mg with each meal and 667 mg with each snack 05/10/16  Yes [provider]  gabapentin (NEURONTIN) 100 MG capsule Take 100 mg by mouth 2 (two) times daily.   Yes [provider]  isosorbide mononitrate (IMDUR) 30 MG 24 hr tablet Take 30 mg by mouth daily. 03/29/21  Yes [provider]  LANTUS SOLOSTAR 100 UNIT/ML Solostar Pen Inject 42 Units into the skin at bedtime. PLEASE MAKE AN APPOINTMENT TO SEE PCP Patient taking differently: Inject 40 Units into the skin at bedtime. PLEASE MAKE AN APPOINTMENT TO SEE PCP 12/25/21  Yes Darci Current, DO  Blood Glucose Monitoring Suppl [facility] VERIO) w/Device KIT Use  daily as indicated 12/25/21   Darci Current, DO  clopidogrel (PLAVIX) 75 MG tablet Take 1 tablet (75 mg total) by mouth daily. Patient not taking: Reported on 05/31/2022 12/14/21 12/14/22  [name], MD  glucose blood [facility] VERIO) test strip Use as instructed 12/25/21   Darci Current, DO  Insulin Pen Needle 31G X 8 MM MISC BD UltraFine III Pen Needles. For use with insulin pen device. Inject insulin 2X daily 12/25/21   Darci Current, DO  Lancets [facility] ULTRASOFT) lancets Once daily testing plus prn for hypoglycemia 12/25/21   Darci Current, DO  naloxone [facility]) nasal spray 4 mg/0.1 mL Use in both nostrils if signs of opioid OD Patient not taking: Reported on 06/16/2022 09/07/21   Teressa Lower, MD  NEEDLE, DISP, 30 G (B-D DISP NEEDLE 30GX1") 30G X 1" MISC 1 each by Does not apply route daily. 12/25/21   Darci Current, DO  OneTouch Delica Lancets [id number] MISC Use daily as indicated 12/25/21   Darci Current, DO     Vitals:   06/15/2022 0945 05/29/2022 1105 05/25/2022 1106 06/11/2022 1145  BP: (!) 95/33 (!) 80/30 (!) 89/21 (!) 91/54  Pulse:      Resp: (!) 27 (!) 29 (!) 25 (!) 22  Temp:      TempSrc:      SpO2:      Weight:      Height:       Exam Gen on vent, sedated No rash, cyanosis or gangrene Sclera anicteric, throat w/ ETT No jvd or bruits Chest clear anterior/ lateral RRR no MRG Abd soft ntnd no mass or ascites +bs, large L sided hernia GU defer MS no joint effusions or deformity Ext trace LE edema, no wounds or ulcers Neuro is on vent, sedated    RIJ TDC in place     Home meds include - gabapentin 100 bid, imdur 30, lantus insulin, narcan prn, aspirin, lipitor, phoslo 2 ac tid, plavix, prns/ vits/ supps     OP HD: TTS [facility]  3h 61mn  400/1.5  94.7kg  2/2 bath TDC  Heparin 7000 - last HD 2/20, post wt 96.2kg   - rocaltrol 1.50 mcg po tiw - last Hb 12.2 on 2/08, no on esa's  CXR 2/21- stable CM, no infiltrates or edema.   Assessment/ Plan: NSTEMI/ cardiogenic shock  - seen by cardiology, EF <20%, +trops. Intubated, started on pressors and IV heparin. [facility] occluded ostial LAD.  Acute resp failure - intubated Hyperkalemia - K+ 8.5, repeat was 5.5.  ESRD - on HD TTS. Had HD as OP yesterday. Severe acidemia/ shock, will need CRRT.  Met acidosis - lactic acidemia and renal failure contributing. Will use pre/ post filter as additional bicarb source.  Hypotension - in shock, pressors as above Anemia esrd - Hb good, no esa needs MBD ckd - Ca in range, add on phos/ albumin. Cont po vdra via tube   RKelly Splinter MD [facility] 05/25/2022, 12:36 PM  Recent Labs  Lab 06/02/2022 1605 06/11/2022 0548 06/13/2022 0549 06/13/2022 0630 06/05/2022 1038  HGB 12.9   < > 14.6  --  13.6  CALCIUM 8.7*  --   --  8.9  --   CREATININE 6.29*  --   --  8.10*  --   K 4.5  --  >8.5* 5.5* 5.5*   < > = values in this interval not displayed.   Inpatient medications:  acetaminophen  650 mg Oral Q6H   aspirin  81 mg Oral Daily   atorvastatin  40 mg Oral Daily   calcium acetate  1,334 mg Oral TID WC   clopidogrel  75 mg Oral Daily   diclofenac Sodium  2 g Topical QID   etomidate       fentaNYL        HYDROmorphone (DILAUDID) injection  0.5 mg Intravenous Once   insulin aspart  0-9 Units Subcutaneous TID WC   insulin glargine-yfgn  5 Units Subcutaneous QHS   lidocaine  1 patch Transdermal Q24H   midazolam       oxymetazoline  1 spray Each Nare Once   phenylephrine       rocuronium bromide       succinylcholine  sodium chloride     heparin 1,000 Units/hr (06/05/2022 1007)   norepinephrine (LEVOPHED) Adult infusion     piperacillin-tazobactam (ZOSYN)  IV     vancomycin     calcium acetate, etomidate, fentaNYL, midazolam, phenylephrine, rocuronium bromide, succinylcholine

## 2022-06-18 NOTE — Procedures (Signed)
Intraosseous Needle Insertion Procedure Note    Date:06/04/2022  Time:7:29 PM   Provider Performing:Jori Thrall   Procedure: Insertion Intraosseous [id number])  Indication(s) Medication administration  Consent Unable to obtain consent due to emergent nature of procedure.  Anesthesia Topical only with 1% lidocaine   Timeout Verified patient identification, verified procedure, site/side was marked, verified correct patient position, special equipment/implants available, medications/allergies/relevant history reviewed, required imaging and test results available.  Procedure Description Area of needle insertion was cleaned with chlorhexidine. Intraosseous needle was placed into the left tibia. Bone marrow was aspirated and site easily flushed. The needle was secured in place and dressing applied.  Complications/Tolerance None; patient tolerated the procedure well.  EBL Minimal

## 2022-06-18 NOTE — ED Notes (Signed)
Pt informed no pain medication has been ordered and we are still waiting for a bed upstairs.

## 2022-06-18 NOTE — Evaluation (Signed)
Occupational Therapy Evaluation Patient Details Name: Rachel Hobbs MRN: [id number] DOB: 1940-06-19 Today's Date: 06/07/2022   History of Present Illness Pt is an 82 yo female admitted from HD with SOB.  Pt possibly with demand ishemia. Work up in progress. PMH: ESRD, PAD, Type IIDM   Clinical Impression   Pt admitted with the above diagnosis and has the deficits outlined below. Pt would benefit from cont OT when more stable from breathing standpoint to increase independence with basic adls and adl transfers so she can d/c home with her daughter. Pt is usually fairly independent with basic adls with use of cane. Pt would benefit from energy conservation education as well.  Will continue to see with focus on above.        Recommendations for follow up therapy are one component of a multi-disciplinary discharge planning process, led by the attending physician.  Recommendations may be updated based on patient status, additional functional criteria and insurance authorization.   Follow Up Recommendations  Home health OT     Assistance Recommended at Discharge Intermittent Supervision/Assistance  Patient can return home with the following A little help with walking and/or transfers;A little help with bathing/dressing/bathroom;Assistance with cooking/housework;Assist for transportation;Help with stairs or ramp for entrance    Functional Status Assessment  Patient has had a recent decline in their functional status and demonstrates the ability to make significant improvements in function in a reasonable and predictable amount of time.  Equipment Recommendations  Other (comment) (tbd)    Recommendations for Other Services       Precautions / Restrictions Precautions Precautions: Fall Precaution Comments: walks with cane normally. Has rollator Restrictions Weight Bearing Restrictions: No      Mobility Bed Mobility Overal bed mobility: Needs Assistance Bed Mobility: Supine to Sit, Sit  to Supine     Supine to sit: Mod assist Sit to supine: Mod assist   General bed mobility comments: Pt pulled up on therapist wanting to sit thinking she would breath better but pt quickly returned to supine    Transfers                   General transfer comment: no transfers attempted.      Balance Overall balance assessment: Needs assistance Sitting-balance support: Feet supported Sitting balance-Leahy Scale: Fair         Standing balance comment: declined                           ADL either performed or assessed with clinical judgement   ADL Overall ADL's : Needs assistance/impaired Eating/Feeding: Set up;Sitting   Grooming: Minimal assistance;Sitting   Upper Body Bathing: Minimal assistance;Sitting   Lower Body Bathing: Maximal assistance;Sit to/from stand   Upper Body Dressing : Minimal assistance;Sitting   Lower Body Dressing: Maximal assistance;Sit to/from stand       Toileting- Water quality scientist and Hygiene: Maximal assistance;Sit to/from stand       Functional mobility during ADLs: Moderate assistance General ADL Comments: Pt limited with all adls due to severe SOB and anxiety during session. MD in during session and asked to not get OOB     Vision Baseline Vision/History: 0 No visual deficits Ability to See in Adequate Light: 0 Adequate Patient Visual Report: No change from baseline Vision Assessment?: No apparent visual deficits     Perception Perception Perception Tested?: No   Praxis Praxis Praxis tested?: Within functional limits    Pertinent Vitals/Pain Pain  Assessment Pain Assessment: No/denies pain     Hand Dominance Right   Extremity/Trunk Assessment Upper Extremity Assessment Upper Extremity Assessment: Overall WFL for tasks assessed   Lower Extremity Assessment Lower Extremity Assessment: Defer to PT evaluation   Cervical / Trunk Assessment Cervical / Trunk Assessment: Other exceptions (hernia in  abdomen)   Communication Communication Communication: No difficulties   Cognition Arousal/Alertness: Awake/alert Behavior During Therapy: Anxious Overall Cognitive Status: Within Functional Limits for tasks assessed                                       General Comments  Pt eval limited due to SOB and anxiety.  Pt with 4 different O2 sat monitors on and none of them reading. Nursing stated O2 sats early in am were in 28s.    Exercises     Shoulder Instructions      Home Living Family/patient expects to be discharged to:: Private residence Living Arrangements: Children Available Help at Discharge: Available 24 hours/day Type of Home: House Home Access: Stairs to enter;Ramped entrance Entrance Stairs-Number of Steps: 4 Entrance Stairs-Rails: Can reach both Home Layout: One level     Bathroom Shower/Tub: Walk-in Hydrologist: Handicapped height     Home Equipment: Rollator (4 wheels);Cane - single point;Shower seat          Prior Functioning/Environment Prior Level of Function : Independent/Modified Independent             Mobility Comments: walks with cane most of the time.  Just got a rollator to have a seat when walking. ADLs Comments: Pt mod I with all  basic adls. Daughter assists with all IADS        OT Problem List: Decreased activity tolerance;Impaired balance (sitting and/or standing);Decreased safety awareness;Obesity;Cardiopulmonary status limiting activity      OT Treatment/Interventions: Self-care/ADL training;Therapeutic activities;Energy conservation    OT Goals(Current goals can be found in the care plan section) Acute Rehab OT Goals Patient Stated Goal: to breathe OT Goal Formulation: With patient Time For Goal Achievement: 06/23/22 Potential to Achieve Goals: Good ADL Goals Pt Will Perform Lower Body Bathing: with supervision;sit to/from stand Pt Will Perform Lower Body Dressing: with supervision Pt  Will Perform Tub/Shower Transfer: Shower transfer;with supervision;ambulating;shower seat Additional ADL Goal #1: Pt will walk to bathroom with cane and complete all toileting with supervision. Additional ADL Goal #2: Pt will state two things she can do at home during adls to conserve energy without cues.  OT Frequency: Min 2X/week    Co-evaluation              AM-PAC OT "6 Clicks" Daily Activity     Outcome Measure Help from another person eating meals?: A Little Help from another person taking care of personal grooming?: A Little Help from another person toileting, which includes using toliet, bedpan, or urinal?: A Lot Help from another person bathing (including washing, rinsing, drying)?: A Little Help from another person to put on and taking off regular upper body clothing?: A Lot Help from another person to put on and taking off regular lower body clothing?: A Lot 6 Click Score: 15   End of Session Equipment Utilized During Treatment: Oxygen Nurse Communication: Mobility status  Activity Tolerance: Patient limited by fatigue Patient left: in bed;with call bell/phone within reach;with family/visitor present  OT Visit Diagnosis: Unsteadiness on feet (R26.81)  Time: [id number] OT Time Calculation (min): 26 min Charges:  OT General Charges $OT Visit: 1 Visit OT Evaluation $OT Eval Moderate Complexity: 1 Mod OT Treatments $Self Care/Home Management : 8-22 mins  Fabienne, [place]roski 05/30/2022, 9:58 AM

## 2022-06-18 NOTE — Progress Notes (Addendum)
FMTS Brief Interim Progress Note  Patient's ABG was significant for pH of 7.221. Indicative of metabolic acidosis with respiratory compensation. At this time also concerned for sepsis of unknown origin.  Patient continues to have worsening hypotension.   Paged CCM who said they would come evaluate patient and recommended 30 cc/kg (2.850 L) NS to resuscitate.  Nephrology agreed with fluid plan in the setting of worsening blood pressures.   - Will order fluids as bolus and monitor respiratory, volume status, and blood pressure - F/u CCM and Nephro recommendations   Lowry Ram, MD  PGY-1 [place]

## 2022-06-18 NOTE — Progress Notes (Signed)
  Patient remains intubated/sedated.   Over the past few hours with increasing pressor requirements and hypothermia. Rhythm unstable with frequent polymorphic PVCs. Now on NE 40.  She is at high risk for ongoing clinical deterioration and cardiac arrest.  Will add VP.   I updated her daughter by phone and suggested consideration of changing code status given progressive decline and severe underlying disease.   She would like to continue full code for now.   CCT 45 mins.   Glori Bickers, MD  5:21 PM

## 2022-06-18 NOTE — Progress Notes (Signed)
I responded to a Code Blue alert for the patient's room. I was available to provide spiritual support for the patient's family and medical team as needed. No family members were present.     05/25/2022 1856  Spiritual Encounters  Type of Visit Initial  Care provided to: Pt not available  Conversation partners present during encounter Nurse  Referral source Code page  Reason for visit Urgent spiritual support  OnCall Visit Yes  Interventions  Spiritual Care Interventions Made Compassionate presence     Chaplain Dr Redgie Grayer

## 2022-06-18 NOTE — Procedures (Signed)
Extubation Procedure Note  Patient Details:   Name: Rachel Hobbs DOB: [birth year] MRN: [id number]   Airway Documentation:    Vent end date: 06/06/2022 Vent end time: 2239   Evaluation  O2 sats: stable throughout and currently acceptable Complications: No apparent complications Patient did tolerate procedure well. Bilateral Breath Sounds: Diminished   No  Pt compassionately extubated per order  Quentin Ore 06/16/2022, 10:39 PM

## 2022-06-18 NOTE — Progress Notes (Signed)
Pharmacy Antibiotic Note  Rachel Hobbs is a 82 y.o. female admitted on 06/11/2022 with worsening L shoulder pain.  Pt now with increased work of breathing in ED, rising troponins, elevated LA.  Pharmacy has been consulted for Vancomycin and Zosyn dosing for empiric sepsis.  Pt now on CRRT.  Plan: Vanc 2gm IV load being given right now then will do 1067m IV q24h Change Zosyn to 3.375gm IV q6h (over 30 min) - CRRT dosing - pt has not received first dose yet Will f/u CRRT tolerance, micro data, and pt's clinical condition Vanc trough prn  Height: 5' 2"$  (157.5 cm) Weight: 94.8 kg (209 lb) IBW/kg (Calculated) : 50.1  Temp (24hrs), Avg:96.9 F (36.1 C), Min:92.3 F (33.5 C), Max:98.9 F (37.2 C)  Recent Labs  Lab 06/16/2022 1605 06/02/2022 0548 06/01/2022 0630 05/27/2022 0859 05/21/2022 1609  WBC 6.4 16.4*  --   --  22.1*  CREATININE 6.29*  --  8.10*  --  8.18*  8.19*  LATICACIDVEN  --  8.8*  --  >9.0* >9.0*     Estimated Creatinine Clearance: 5.8 mL/min (A) (by C-G formula based on SCr of 8.18 mg/dL (H)).    No Known Allergies  Antimicrobials this admission: Vanc 2/21 >>  Zosyn 2/21 >>   Microbiology results: 2/21 BCx: ordered  2/21: COVID/Flu/RSV neg  Thank you for allowing pharmacy to be a part of this patient's care.  CSherlon Handing PharmD, BCPS Please see amion for complete clinical pharmacist phone list 05/23/2022 6:37 PM

## 2022-06-18 NOTE — Progress Notes (Signed)
   Patient with VT arrest x 3 over past 30 minutes. Had shock x 5 and several minutes od CPR>   Both CCM and myself at bedside.   We gave amio, lidocaine, Ca2+ and Mg. Now stabilized. Will decrease NE to keep SBP > 100 (MAPs have been low due to low diastolic pressure)  I d/w her daughter again and will now change code status to DNR (no CPR or shocks. Meds ok).   Call witnessed by [facility] RN and CCM team.   Additional CCT 45 mins  Glori Bickers, MD  5:59 PM

## 2022-06-18 NOTE — Consult Note (Signed)
NAME:  Rachel Hobbs, MRN:  [id number], DOB:  06/21/1940, LOS: 0 ADMISSION DATE:  06/05/2022, CONSULTATION DATE:  2/21  REFERRING MD:  Family Medicine  CHIEF COMPLAINT:  SOB and Left should pain   History of Present Illness:  82 year old female with significant pmhx of CHF, DM2, Hyperlipidemia, HTN, PVD, CKD with utilization of dialysis  presented with acute left shoulder pain with SOB on 2/20 after receiving HD for approximately 2 hrs. Initial troponin's were within the high 300s and thought to be demand ischemia per cardiology. However, troponin's increased from 388 to 863 to 1390 with EKG changes confirming ACS. Patient began to have increased WOB, SOB, and hypotension. PCCM was then consulted to assist with decompensating hemodynamic status.   Pertinent  Medical History   Past Medical History:  Diagnosis Date   Anemia of renal disease 06/16/2006   Qualifier: Diagnosis of  By: Marinell Blight, Dawn     Arthritis    CHF (congestive heart failure) ([place])    COVID 2021   Depression    Diabetes mellitus    type 2   Hernia    Hyperlipidemia    Hypertension    Iron deficiency anemia    LEG EDEMA, BILATERAL 12/03/2008   Qualifier: Diagnosis of  By: Carlena Sax  MD, Colletta [place]     Low iron    Nodule of soft tissue 06/22/2012   Peripheral vascular disease ([place])    in legs   Pneumonia    teenager   Renal disorder    CKD - dialysis T/TH/Sa   Shortness of breath dyspnea    with exertion   SMALL BOWEL OBSTRUCTION, HX OF 02/15/2007   Qualifier: Diagnosis of  By: [name], HEIDI     Type 2 diabetes mellitus with diabetic peripheral angiopathy without gangrene ([place]) 12/16/2015     Significant Hospital Events: Including procedures, antibiotic start and stop dates in addition to other pertinent events   2/20 Admit with SOB, elevated troponin's 2/21 PCCM consult, intubation, heart cath  Interim History / Subjective:  Patient restless, increased SOB and WOB, hypotensive   Objective   Blood  pressure (!) 90/39, pulse 84, temperature (!) 97.5 F (36.4 C), temperature source Oral, resp. rate (!) 26, height 5' 2"$  (1.575 m), weight 94.8 kg, SpO2 98 %.    Vent Mode: PRVC FiO2 (%):  [100 %] 100 % Set Rate:  [26 bmp] 26 bmp Vt Set:  [400 mL] 400 mL PEEP:  [5 cmH20] 5 cmH20 Plateau Pressure:  [22 cmH20] 22 cmH20   Intake/Output Summary (Last 24 hours) at 05/20/2022 1314 Last data filed at 06/14/2022 1245 Gross per 24 hour  Intake 3.32 ml  Output --  Net 3.32 ml   Filed Weights   06/07/2022 1455  Weight: 94.8 kg    Examination: General: Chronically ill appearing female lying on ED stretcher  HENT: Pink Moist, MM, upper and bottom dentures, PERRLA intact  Lungs: Crackles upper lung fields, lower lung fields diminished, increase WOB Cardiovascular: s1 and s2 auscultated but distant, no apparent murmurs/gallops/rubs Abdomen: Obese, distended, abdomen soft, BS active all quadrants  Extremities: able to move all extremities, weak, generalized edema, left upper arm fistula (+ bruit and thrill)  Neuro: alert and oriented x 4, follow commands GU: Intact, oliguric   Resolved Hospital Problem list   N/a   Assessment & Plan:   Cardiogenic Shock in likely setting of ACS Cannot r/o septic shock at this time Hx of CHF High  Sensitivity Troponin  2/20 373 >>388 2/21 863 >> 1390  2/21 D-dimer 4.47  2/21 WBC 16.4, lactic acid 8.8 to >9.0  EKG changes sinus rhythm with left anterior fascicular block on 2/20 and 2/21 Junctional rhythm  With New Hypotension: SBP mid 80s to low 90s  ECHO 11/26/20 EF 66 %. The left ventricle has normal function. The left ventricle has no regional wall  motion abnormalities.  Repeat ECHO completed in ED suspecting EF <20%-formal read pending P:  Echo ordered STAT-results pending  Trend Lactic Acids, troponins  Cards following Intubate for worsening hemodynamic status and STAT cath  Peripheral levophed ordered, MAP Goal >65  Monitor VS continuously with  Cardiac tele  Will obtain CVC, Art line  STAT ABG, COOX May consider utilization of milrinone  Continue Heparin gtt, hold oral plavix for now   ESRD on dialysis  Metabolic Acidosis  Electrolyte derangement  Last outpatient HD received on 2/20  Cr 2/21 8.10 from 6.29  Tunneled R HD cath ABG pH 7.221, Pco2 19.0, Po2 127, bicarb 7.9  P:  Will need CRRT, nephro consult Place Foley catheter Monitor Strict I/Os  Avoid nephrotoxic medications  MAP Goal >65 to maintain renal perfusion  Monitor daily renal function panel with electrolytes  May need utilization of bicarb   HTN Currently Hypotensive  P: Hold home antihypertensives for now   HLD hx P: Continue home statin tomorrow  DM2 05/27/2022 Hgb A1c 7.8  Glucose on BMP 252  P: Continue SSI switch from sensitive scale to moderate  Q4 CBGs   At risk for malnutrition  P: Start tube feeds/dietitian consult tomorrow   Best Practice (right click and "Reselect all SmartList Selections" daily)   Diet/type: NPO DVT prophylaxis: systemic heparin GI prophylaxis: PPI Lines: Dialysis Catheter Foley:  Yes, and it is still needed Code Status:  full code Last date of multidisciplinary goals of care discussion (daughter updated at bedside)   Labs   CBC: Recent Labs  Lab 05/24/2022 1605 05/27/2022 0548 06/16/2022 0549 06/02/2022 1038  WBC 6.4 16.4*  --   --   HGB 12.9 13.7 14.6 13.6  HCT 38.9 42.4 43.0 40.0  MCV 106.0* 109.8*  --   --   PLT 157 158  --   --     Basic Metabolic Panel: Recent Labs  Lab 05/22/2022 1605 06/06/2022 0549 05/26/2022 0630 06/15/2022 1038  NA 134* 124* 133* 127*  K 4.5 >8.5* 5.5* 5.5*  CL 93*  --  91*  --   CO2 28  --  16*  --   GLUCOSE 98  --  252*  --   BUN 28*  --  38*  --   CREATININE 6.29*  --  8.10*  --   CALCIUM 8.7*  --  8.9  --    GFR: Estimated Creatinine Clearance: 5.8 mL/min (A) (by C-G formula based on SCr of 8.1 mg/dL (H)). Recent Labs  Lab 05/30/2022 1605 06/06/2022 0548 06/02/2022 0859   WBC 6.4 16.4*  --   LATICACIDVEN  --  8.8* >9.0*    Liver Function Tests: No results for input(s): "AST", "ALT", "ALKPHOS", "BILITOT", "PROT", "ALBUMIN" in the last 168 hours. No results for input(s): "LIPASE", "AMYLASE" in the last 168 hours. No results for input(s): "AMMONIA" in the last 168 hours.  ABG    Component Value Date/Time   PHART 7.221 (L) 06/11/2022 1038   PCO2ART 19.0 (LL) 06/07/2022 1038   PO2ART 127 (H) 06/06/2022 1038   HCO3 7.9 (L)  05/21/2022 1038   TCO2 8 (L) 06/16/2022 1038   ACIDBASEDEF 18.0 (H) 06/06/2022 1038   O2SAT 98 05/25/2022 1038     Coagulation Profile: No results for input(s): "INR", "PROTIME" in the last 168 hours.  Cardiac Enzymes: No results for input(s): "CKTOTAL", "CKMB", "CKMBINDEX", "TROPONINI" in the last 168 hours.  HbA1C: HbA1c, POC (controlled diabetic range)  Date/Time Value Ref Range Status  12/25/2021 01:50 PM 7.5 (A) 0.0 - 7.0 % Final  09/30/2021 03:30 PM 7.1 (A) 0.0 - 7.0 % Final   Hgb A1c MFr Bld  Date/Time Value Ref Range Status  05/31/2022 06:30 AM 7.8 (H) 4.8 - 5.6 % Final    Comment:    (NOTE) Pre diabetes:          5.7%-6.4%  Diabetes:              >6.4%  Glycemic control for   <7.0% adults with diabetes     CBG: Recent Labs  Lab 05/25/2022 2021 05/22/2022 0736  GLUCAP 100* 211*    Review of Systems:   Please see the history of present illness. All other systems reviewed and are negative    Past Medical History:  She,  has a past medical history of Anemia of renal disease (06/16/2006), Arthritis, CHF (congestive heart failure) ([place]), COVID (2021), Depression, Diabetes mellitus, Hernia, Hyperlipidemia, Hypertension, Iron deficiency anemia, LEG EDEMA, BILATERAL (12/03/2008), Low iron, Nodule of soft tissue (06/22/2012), Peripheral vascular disease ([place]), Pneumonia, Renal disorder, Shortness of breath dyspnea, SMALL BOWEL OBSTRUCTION, HX OF (02/15/2007), and Type 2 diabetes mellitus with diabetic peripheral  angiopathy without gangrene ([place]) (12/16/2015).   Surgical History:   Past Surgical History:  Procedure Laterality Date   ABDOMINAL HYSTERECTOMY     in the 69's   AORTIC ARCH ANGIOGRAPHY N/A 12/14/2021   Procedure: AORTIC ARCH ANGIOGRAPHY;  Surgeon: [name], MD;  Location: [place] [facility];  Service: Cardiovascular;  Laterality: N/A;   AV FISTULA PLACEMENT Left 12/05/2015   Procedure: RADIOCEPHALIC VERSUS BRACHIOCEPHALIC ARTERIOVENOUS (AV) FISTULA CREATION;  Surgeon: [name], MD;  Location: [place];  Service: Vascular;  Laterality: Left;   AV FISTULA PLACEMENT Left 05/20/2017   Procedure: INSERTION OF ARTERIOVENOUS (AV) GORE-TEX VASCULAR STRETCH 4-7 GRAFT ARM LEFT UPPER ARM;  Surgeon: [name], MD;  Location: [place];  Service: Vascular;  Laterality: Left;   AV FISTULA PLACEMENT Left 08/14/2021   Procedure: LEFT ARM ARTERIOVENOUS (AV) FISTULA CREATION;  Surgeon: [name], MD;  Location: [place];  Service: Vascular;  Laterality: Left;   [place] Left 06/04/2016   Procedure: FIRST STAGE BASILIC VEIN TRANSPOSITION;  Surgeon: [name], MD;  Location: [place];  Service: Vascular;  Laterality: Left;   [place] Left 08/04/2016   Procedure: LEFT UPPER ARM [place], SECOND STAGE;  Surgeon: [name], MD;  Location: [place];  Service: Vascular;  Laterality: Left;   HERNIA REPAIR     umbilical in the [id number]   INSERTION OF DIALYSIS CATHETER Right 12/05/2015   Procedure: INSERTION OF DIALYSIS CATHETER;  Surgeon: [name], MD;  Location: [place];  Service: Vascular;  Laterality: Right;   IR THROMBECTOMY AV FISTULA W/THROMBOLYSIS/PTA INC/SHUNT/IMG LEFT Left 05/09/2019   IR US GUIDE VASC ACCESS LEFT  05/09/2019   LIGATION OF COMPETING BRANCHES OF ARTERIOVENOUS FISTULA Left 09/04/2021   Procedure: LIGATION OF LEFT ARM CEPHALIC VEIN;  Surgeon: [name], MD;  Location: [place];  Service:  Vascular;  Laterality: Left;   PERIPHERAL VASCULAR BALLOON ANGIOPLASTY  12/14/2021   Procedure: PERIPHERAL VASCULAR BALLOON ANGIOPLASTY;  Surgeon: [name], MD;  Location: [place] [facility];  Service: Cardiovascular;;   UPPER EXTREMITY ANGIOGRAPHY Left 12/14/2021   Procedure: Upper Extremity Angiography;  Surgeon: [name], MD;  Location: [place] [facility];  Service: Cardiovascular;  Laterality: Left;   UPPER EXTREMITY VENOGRAPHY Bilateral 06/15/2021   Procedure: UPPER EXTREMITY VENOGRAPHY;  Surgeon: [name], MD;  Location: [place] [facility];  Service: Cardiovascular;  Laterality: Bilateral;     Social History:   reports that she has never smoked. She has never been exposed to tobacco smoke. She has never used smokeless tobacco. She reports that she does not drink alcohol and does not use drugs.   Family History:  Her family history includes COPD in her father; Cancer in her father; Diabetes in her daughter; Hypertension in her father and mother; Kidney disease in her mother and sister.   Allergies No Known Allergies   Home Medications  Prior to Admission medications   Medication Sig Start Date End Date Taking? Authorizing Provider  acetaminophen (TYLENOL) 500 MG tablet Take 1,000 mg by mouth daily as needed for moderate pain.   Yes [provider]  aspirin 81 MG tablet Take 81 mg by mouth every other day.   Yes [provider]  atorvastatin (LIPITOR) 40 MG tablet TAKE 1 TABLET(40 MG) BY MOUTH DAILY AT 6 PM Strength: 40 mg 05/28/21  Yes Lurline Del, DO  calcium acetate (PHOSLO) 667 MG capsule Take 667-1,334 mg by mouth See admin instructions. Take 1334 mg with each meal and 667 mg with each snack 05/10/16  Yes [provider]  gabapentin (NEURONTIN) 100 MG capsule Take 100 mg by mouth 2 (two) times daily.   Yes [provider]  isosorbide mononitrate (IMDUR) 30 MG 24 hr tablet Take 30 mg by mouth daily.  03/29/21  Yes [provider]  LANTUS SOLOSTAR 100 UNIT/ML Solostar Pen Inject 42 Units into the skin at bedtime. PLEASE MAKE AN APPOINTMENT TO SEE PCP Patient taking differently: Inject 40 Units into the skin at bedtime. PLEASE MAKE AN APPOINTMENT TO SEE PCP 12/25/21  Yes Darci Current, DO  Blood Glucose Monitoring [facility] VERIO) w/Device KIT Use daily as indicated 12/25/21   Darci Current, DO  clopidogrel (PLAVIX) 75 MG tablet Take 1 tablet (75 mg total) by mouth daily. Patient not taking: Reported on 05/29/2022 12/14/21 12/14/22  [name], MD  glucose blood [facility] VERIO) test strip Use as instructed 12/25/21   Darci Current, DO  Insulin Pen Needle 31G X 8 MM MISC BD UltraFine III Pen Needles. For use with insulin pen device. Inject insulin 2X daily 12/25/21   Darci Current, DO  Lancets [facility] ULTRASOFT) lancets Once daily testing plus prn for hypoglycemia 12/25/21   Darci Current, DO  naloxone [facility]) nasal spray 4 mg/0.1 mL Use in both nostrils if signs of opioid OD Patient not taking: Reported on 06/12/2022 09/07/21   Kommor, Debe Coder, MD  NEEDLE, DISP, 30 G (B-D DISP NEEDLE 30GX1") 30G X 1" MISC 1 each by Does not apply route daily. 12/25/21   Darci Current, DO  OneTouch Delica Lancets [id number] MISC Use daily as indicated 12/25/21   Darci Current, DO     Critical care time: 1   CRITICAL CARE Performed by: Waldo Laine BSN, RN, S-ACNP    Total critical care time: 45 minutes  Critical care time was  exclusive of separately billable procedures and treating other patients.  Critical care was necessary to treat or prevent imminent or life-threatening deterioration.  Critical care was time spent personally by me on the following activities: development of treatment plan with patient and/or surrogate as well as nursing, discussions with consultants, evaluation of patient's response to treatment, examination of patient, obtaining history from patient or surrogate, ordering  and performing treatments and interventions, ordering and review of laboratory studies, ordering and review of radiographic studies, pulse oximetry and re-evaluation of patient's condition.   Waldo Laine BSN, RN, S-ACNP

## 2022-06-18 NOTE — IPAL (Signed)
Interdisciplinary Goals of Care Family Meeting   Date carried out:: 05/21/2022  Location of the meeting: Phone conference  Member's involved: Physician, Bedside Registered Nurse, and Family Member or next of kin  Durable Power of Attorney or acting medical decision maker: Marthann Schiller    Discussion: We discussed goals of care for [employer] .    The Clinical status was relayed to daughter at bedside  in detail.   Updated and notified of patients medical condition.     Patient remains unresponsive and will not open eyes to command.   Patient underwent cardiac arrest multiple times and was shocked x 5, she is on high-dose vasopressor support to maintain and support her life   Patient with Progressive multiorgan failure with a very high probablity of a very minimal chance of meaningful recovery despite all aggressive and optimal medical therapy.  Code status: Full DNR  Disposition: Continue current acute care    Family are satisfied with Plan of action and management. All questions answered   Jacky Kindle MD [facility] See Amion for pager If no response to pager, please call [phone] until 7pm After 7pm, Please call E-link [phone]

## 2022-06-18 NOTE — Progress Notes (Addendum)
Rounding Note    Patient Name: Rachel Hobbs Date of Encounter: 06/11/2022  [place] Cardiologist: None   Subjective   Patient is very anxious appearing and tachypneic. She is restless and almost constantly attempting to change positions citing shortness of breath. Denies chest pain, palpitations.  Inpatient Medications    Scheduled Meds:  acetaminophen  650 mg Oral Q6H   aspirin  81 mg Oral Daily   atorvastatin  40 mg Oral Daily   calcium acetate  1,334 mg Oral TID WC   clopidogrel  75 mg Oral Daily   diclofenac Sodium  2 g Topical QID   heparin  5,000 Units Subcutaneous Q8H   insulin aspart  0-9 Units Subcutaneous TID WC   isosorbide mononitrate  60 mg Oral Daily   lidocaine  1 patch Transdermal Q24H   oxymetazoline  1 spray Each Nare Once   Continuous Infusions:  PRN Meds: calcium acetate   Vital Signs    Vitals:   05/26/2022 0400 06/11/2022 0500 06/04/2022 0545 05/21/2022 0740  BP: (!) 98/54 (!) 101/56 126/74   Pulse:   62   Resp: 20 (!) 21 18   Temp:    (!) 97.5 F (36.4 C)  TempSrc:    Oral  SpO2:   96%   Weight:      Height:       No intake or output data in the 24 hours ending 05/29/2022 0834    06/11/2022    2:55 PM 05/19/2022    3:44 PM 04/16/2022    2:20 PM  Last 3 Weights  Weight (lbs) 209 lb 210 lb 209 lb 8 oz  Weight (kg) 94.802 kg 95.255 kg 95.029 kg      Telemetry    First degree HB, QRS widened. Intermittent bradycardia with rates in the 40s - Personally Reviewed  ECG    Significant baseline artifact. Appears to have new LBBB morphology - Personally Reviewed  Physical Exam   GEN: Short of breath and anxious appearing Neck: difficult to appreciate with body habitus Cardiac: RRR, no murmurs, rubs, or gallops.  Respiratory: Clear to auscultation bilaterally. GI: significant abdominal distension secondary to hernia MS: No edema; No deformity. Upper extremities are mildly cool to touch Neuro:  Nonfocal  Psych: Normal affect    Labs    High Sensitivity Troponin:   Recent Labs  Lab 05/21/2022 1605 05/27/2022 1755 05/30/2022 0548 05/25/2022 0630  TROPONINIHS 373* 388* 863* 1,390*     Chemistry Recent Labs  Lab 05/20/2022 1605 06/13/2022 0549 05/21/2022 0630  NA 134* 124* 133*  K 4.5 >8.5* 5.5*  CL 93*  --  91*  CO2 28  --  16*  GLUCOSE 98  --  252*  BUN 28*  --  38*  CREATININE 6.29*  --  8.10*  CALCIUM 8.7*  --  8.9  GFRNONAA 6*  --  5*  ANIONGAP 13  --  26*    Lipids  Recent Labs  Lab 06/04/2022 0630  CHOL 151  TRIG 123  HDL 61  LDLCALC 65  CHOLHDL 2.5    Hematology Recent Labs  Lab 05/29/2022 1605 06/01/2022 0548 06/02/2022 0549  WBC 6.4 16.4*  --   RBC 3.67* 3.86*  --   HGB 12.9 13.7 14.6  HCT 38.9 42.4 43.0  MCV 106.0* 109.8*  --   MCH 35.1* 35.5*  --   MCHC 33.2 32.3  --   RDW 15.0 16.6*  --   PLT 157 158  --  Thyroid  Recent Labs  Lab 05/27/2022 0630  TSH 3.609    BNPNo results for input(s): "BNP", "PROBNP" in the last 168 hours.  DDimer  Recent Labs  Lab 06/06/2022 0630  DDIMER 4.47*     Radiology    DG ABD ACUTE 2+V W 1V CHEST  Result Date: 05/30/2022 CLINICAL DATA:  [id number] dyspnea. Shortness of breath. CHF. Large abdominal hernia. EXAM: DG ABDOMEN ACUTE WITH 1 VIEW CHEST COMPARISON:  CT with oral contrast 01/15/2022, AP Lat chest yesterday at 3:26 p.m. FINDINGS: AP chest and supine and left lateral decubitus abdomen films, 6:35 a.m.: There is no evidence of dilated bowel loops or free intraperitoneal air. Large bowel containing left ventral abdominal wall hernia is again noted. There is a 9 mm gallstone again seen in the right upper abdomen. No radiopaque urinary calculi or other acute radiographic abnormality is seen, with again noted extensive vascular calcifications. There is mild cardiomegaly without evidence of CHF. There is aortic atherosclerosis and slight tortuosity with stable mediastinum. Right IJ dialysis catheter terminates at about the superior cavoatrial junction.  There is a small left pleural effusion developing with overlying left basilar opacity which could be atelectasis or consolidation. The remaining lungs remain clear. There are vascular stents in the left axilla. IMPRESSION: 1. No evidence of bowel obstruction or free intraperitoneal air. 2. Large bowel containing left ventral abdominal wall hernia. 3. Small left pleural effusion developing with overlying left basilar opacity which could be atelectasis or consolidation. 4. Mild cardiomegaly without evidence of CHF. 5. Aortic atherosclerosis. Electronically Signed   By: Telford Nab M.D.   On: 06/01/2022 07:15   CT Angio Chest Pulmonary Embolism (PE) W or WO Contrast  Result Date: 06/12/2022 CLINICAL DATA:  82 year old female with persistent left shoulder pain. EXAM: CT ANGIOGRAPHY CHEST WITH CONTRAST TECHNIQUE: Multidetector CT imaging of the chest was performed using the standard protocol during bolus administration of intravenous contrast. Multiplanar CT image reconstructions and MIPs were obtained to evaluate the vascular anatomy. RADIATION DOSE REDUCTION: This exam was performed according to the departmental dose-optimization program which includes automated exposure control, adjustment of the mA and/or kV according to patient size and/or use of iterative reconstruction technique. CONTRAST:  54m OMNIPAQUE IOHEXOL 350 MG/ML SOLN COMPARISON:  Chest and left shoulder radiographs yesterday. CT Abdomen and Pelvis 01/15/2022. FINDINGS: Cardiovascular: Excellent contrast bolus timing in the pulmonary arterial tree. Mild respiratory motion. No pulmonary artery filling defect. Calcified coronary artery and Calcified aortic atherosclerosis. With little to no contrast in the aorta. Mild contrast reflux into the hepatic veins and hepatic IVC. But only mild cardiomegaly. No pericardial effusion. Right IJ approach dual lumen dialysis type catheter in place with evidence of right innominate venous stenosis and numerous  enhancing right chest wall venous collaterals. Superimposed left axillary vascular grafts and/or stents, not evaluated as there is no left upper extremity vascular contrast present. Mediastinum/Nodes: Negative. No mediastinal mass or lymphadenopathy. Lungs/Pleura: Major airways are patent with mild atelectatic changes. Fairly symmetric dependent mild pulmonary opacity most resembles atelectasis. No convincing pleural effusion. No pneumothorax, pulmonary edema, or convincing pulmonary inflammation. Upper Abdomen: Negative visible noncontrast upper abdomen except for some contrast reflux into the liver. Musculoskeletal: Partially visible bulky lower cervical spine endplate degeneration. T1 spina bifida occulta, normal variant. Preserved upper thoracic disc spaces. Lower thoracic vacuum disc degeneration. Left ribs appear intact. Visible left shoulder osseous structures are intact. Contralateral right shoulder more pronounced glenohumeral joint space loss and degeneration. No acute osseous abnormality identified. Review of  the MIP images confirms the above findings. IMPRESSION: 1. Negative for acute pulmonary embolus. 2. Mild pulmonary atelectasis. No other acute finding in the Chest. 3. Right side central venous stenosis related to dialysis catheter. Vascular stents or grafts in the left upper extremity. Extensive coronary artery and Aortic Atherosclerosis (ICD10-I70.0). Electronically Signed   By: Genevie Ann M.D.   On: 06/13/2022 06:42   DG Chest 2 View  Result Date: 05/20/2022 CLINICAL DATA:  Left shoulder pain.  Neck crepitus. EXAM: CHEST - 2 VIEW COMPARISON:  Chest radiographs 04/20/2022 and 09/12/2021 FINDINGS: Right internal jugular dual-lumen central venous catheter tip again overlies the superior vena cava/right atrial junction. Cardiac silhouette is moderately enlarged. Mediastinal contours within normal limits. Moderate calcification within the aortic arch. The lungs appear clear. No pleural effusion or  pneumothorax is seen. Moderate multilevel degenerative disc changes of the thoracic spine. There are two sets of vascular stents partially visualized within the left upper arm. IMPRESSION: Stable cardiomegaly.  No acute lung process. Electronically Signed   By: Yvonne Kendall M.D.   On: 05/25/2022 16:03   DG Shoulder Left  Result Date: 05/25/2022 CLINICAL DATA:  Shoulder pain with neck crepitus EXAM: LEFT SHOULDER - 2+ VIEW COMPARISON:  None Available. FINDINGS: Moderate AC joint degenerative change. Mild to moderate glenohumeral degenerative change. No fracture or malalignment. Narrowed appearing subacromial space. Stents in the left upper extremity IMPRESSION: Degenerative changes of the [facility] and glenohumeral joints. Narrowed subacromial space suggesting rotator cuff pathology. No acute osseous abnormality Electronically Signed   By: Donavan Foil M.D.   On: 06/04/2022 15:58    Cardiac Studies   11/26/2020 TTE  IMPRESSIONS     1. Left ventricular ejection fraction by 3D volume is 66 %. The left  ventricle has normal function. The left ventricle has no regional wall  motion abnormalities. There is moderate left ventricular hypertrophy. Left  ventricular diastolic parameters are  consistent with Grade I diastolic dysfunction (impaired relaxation).   2. Right ventricular systolic function is normal. The right ventricular  size is normal. There is mildly elevated pulmonary artery systolic  pressure. The estimated right ventricular systolic pressure is [id number] mmHg.   3. There are calcified mitral valve chordae. The mitral valve is normal  in structure. Trivial mitral valve regurgitation. No evidence of mitral  stenosis. The mean mitral valve gradient is 4.0 mmHg.   4. The aortic valve is normal in structure. Aortic valve regurgitation is  not visualized. No aortic stenosis is present.   5. The inferior vena cava is normal in size with greater than 50%  respiratory variability, suggesting right  atrial pressure of 3 mmHg.   FINDINGS   Left Ventricle: Left ventricular ejection fraction by 3D volume is 66 %.  The left ventricle has normal function. The left ventricle has no regional  wall motion abnormalities. The left ventricular internal cavity size was  normal in size. There is  moderate left ventricular hypertrophy. Left ventricular diastolic  parameters are consistent with Grade I diastolic dysfunction (impaired  relaxation).   Right Ventricle: The right ventricular size is normal. No increase in  right ventricular wall thickness. Right ventricular systolic function is  normal. There is mildly elevated pulmonary artery systolic pressure. The  tricuspid regurgitant velocity is 2.73   m/s, and with an assumed right atrial pressure of 8 mmHg, the estimated  right ventricular systolic pressure is [id number] mmHg.   Left Atrium: Left atrial size was normal in size.   Right Atrium: Right  atrial size was normal in size.   Pericardium: There is no evidence of pericardial effusion.   Mitral Valve: There are calcified mitral valve chordae. The mitral valve  is normal in structure. There is mild thickening of the mitral valve  leaflet(s). There is mild calcification of the mitral valve leaflet(s).  Trivial mitral valve regurgitation. No  evidence of mitral valve stenosis. MV peak gradient, 8.6 mmHg. The mean  mitral valve gradient is 4.0 mmHg.   Tricuspid Valve: The tricuspid valve is normal in structure. Tricuspid  valve regurgitation is mild . No evidence of tricuspid stenosis.   Aortic Valve: The aortic valve is normal in structure. Aortic valve  regurgitation is not visualized. No aortic stenosis is present.   Pulmonic Valve: The pulmonic valve was normal in structure. Pulmonic valve  regurgitation is not visualized. No evidence of pulmonic stenosis.   Aorta: The aortic root is normal in size and structure.   Venous: The inferior vena cava is normal in size with greater than  50%  respiratory variability, suggesting right atrial pressure of 3 mmHg.   IAS/Shunts: No atrial level shunt detected by color flow Doppler.    Patient Profile     82 y.o. female with a history of Type II DM with ESRD, Diabetic neuropathy, PAD; Pain after ligation of AV fistula with PAD intervention 2023 (seen by vascular this admission), HTN, HfpEF Morbid obesity.  Assessment & Plan    Elevated troponin Abnormal ECG  Patient presented to the ED from dialysis due to worsening shortness of breath and left shoulder/arm pain. Known cardiac risk factors of ESRD, PAD, CAC, HLD, DM, HTN. Troponin elevated: 373->388->863->1390. Coronary calcifications on chest CT.  Patient's initial clinical picture appeared be more consistent with demand ischemia. However, progressing dyspnea with increasing troponin and ECG changes now more concerning for ACS.  Start ACS dose heparin Stat echocardiogram requested Even if significant WMA appreciated, it's unclear whether patient could tolerate LHC due to anxiety/dyspnea. Significant abdominal hernia causes notable baseline dyspnea for patient.  Continue Atorvastatin 26m  Dyspnea  Patient remains tachypneic, appears short of breath. Large abdominal hernia certainly worsens baseline breathing.  CTA without embolism or other acute pulmonary findings. Chest x-ray with small left pleural effusion.   Patient with cool extremities and O2 saturation not available due to poor pleth wave. Stat ABG ordered with repeat lactic acid.  Would strongly consider involving critical care team given patient's worsening clinical picture.   HFpEF  Patient's volume status is difficult to assess due to obesity and ventral hernia. Little benefit to evaluating BNP given ESRD. Lactic acid 8.8 overnight.    Volume management per nephrology/dialysis Concern for low output HF. Repeat lactic acid.        For questions or updates, please contact [place]  consult [email or web address] for contact info under        Signed, ELily Kocher PA-C  06/15/2022, 8:34 AM    History and all data above reviewed.  Patient examined.  I agree with the findings as above.  Patient seen this morning on rounds.  Not complaining of back/shoulder or neck pain.  She reports that she is SOB.  Unable to get O2 sats.  She does not want to answer questions as she seems uncomfortable.  Her family member is not happy about answering questions but reports that her mother was OK when she went to dialysis.  I see previous cardiology records describing SOB.  Lactic acid was elevated but there were  some spuriously abnormal labs.  Trop is trending up.  EKG with development of IVCD.   The patient exam reveals COR:RRR  ,  Lungs: Decreased breath sounds  ,  Abd: Positive bowel sounds, no rebound no guarding, Ext Decreased upper extremity pulses  .  All available labs, radiology testing, previous records reviewed. Agree with documented assessment and plan. SOB:  Repeat trop.  Echo this morning.  Check ABG for O2 sats.  I sent a message to the primary team.   Elevated troponin:  Difficult to sort out but given the QRS morphology change and the rising troponin we will start heparin.  Continue ASA and Plavix.  She reports no pain currently.  She wants to leave the hospital and wants to be repositioned frequently in the bed.  She is high risk for cath at the moment.   I would suggest ICU bed.  Renal is planning dialysis today.   Minus Breeding  9:37 AM  05/29/2022

## 2022-06-18 NOTE — ED Notes (Signed)
Pt refusing covid/flu swab; Dr. Madison Hickman notified

## 2022-06-18 NOTE — ED Notes (Signed)
Dr. Madison Hickman at bedside

## 2022-06-18 NOTE — ED Notes (Signed)
Pt anxious, c/o sob; pt repositioned; brief runs of bradycardia 30's noted; pt denies pain; repeat EKG done; Dr. Madison Hickman notifed

## 2022-06-18 NOTE — Procedures (Signed)
Arterial Catheter Insertion Procedure Note  Rachel Hobbs  [id number]  01/19/1941  Date:05/28/2022  Time:1:44 PM    Provider Performing: Jacky Kindle    Procedure: Insertion of Arterial Line [phone]) with US guidance [id number])   Indication(s) Blood pressure monitoring and/or need for frequent ABGs  Consent Risks of the procedure as well as the alternatives and risks of each were explained to the patient and/or caregiver.  Consent for the procedure was obtained and is signed in the bedside chart  Anesthesia None   Time Out Verified patient identification, verified procedure, site/side was marked, verified correct patient position, special equipment/implants available, medications/allergies/relevant history reviewed, required imaging and test results available.   Sterile Technique Maximal sterile technique including full sterile barrier drape, hand hygiene, sterile gown, sterile gloves, mask, hair covering, sterile ultrasound probe cover (if used).   Procedure Description Area of catheter insertion was cleaned with chlorhexidine and draped in sterile fashion. With real-time ultrasound guidance an arterial catheter was attempted into the right  Axillary  artery, but couldn't not thread guide wire twice, procedure was aborted as patient had to go cath lab urgently     Complications/Tolerance None; patient tolerated the procedure well.   EBL Minimal   Specimen(s) None

## 2022-06-18 NOTE — Progress Notes (Signed)
Maxed on NE, wide complex bigeminy, rate in 80s. Cardiology added vasopressin. Adding epinephrine if MAP remains <65. On CRRT. AHF discussed with family. No options for MCS. Prognosis poor.   Julian Hy, DO 06/07/2022 5:26 PM [facility]  For contact information, see Amion. If no response to pager, please call PCCM consult pager. After hours, 7PM- 7AM, please call Elink.

## 2022-06-18 NOTE — Progress Notes (Signed)
Pharmacy Antibiotic Note  Rachel Hobbs is a 82 y.o. female admitted on 05/30/2022 with worsening L shoulder pain.  Pt now with increased work of breathing in ED, rising troponins, elevated LA.  Pharmacy has been consulted for Vancomycin and Zosyn dosing for empiric sepsis.  Plan: Vanc 2gm IV x 1 Follow-up HD plan for future doses (was TTS outpt) Zosyn 2.25 gm IV q8h   Height: 5' 2"$  (157.5 cm) Weight: 94.8 kg (209 lb) IBW/kg (Calculated) : 50.1  Temp (24hrs), Avg:97.9 F (36.6 C), Min:97.5 F (36.4 C), Max:98.9 F (37.2 C)  Recent Labs  Lab 05/31/2022 1605 06/03/2022 0548 06/03/2022 0630 05/28/2022 0859  WBC 6.4 16.4*  --   --   CREATININE 6.29*  --  8.10*  --   LATICACIDVEN  --  8.8*  --  >9.0*    Estimated Creatinine Clearance: 5.8 mL/min (A) (by C-G formula based on SCr of 8.1 mg/dL (H)).    No Known Allergies  Antimicrobials this admission: Vanc 2/21 >>  Zosyn 2/21 >>   Dose adjustments this admission:   Microbiology results: 2/21 BCx: ordered  2/21: COVID/Flu/RSV neg  Thank you for allowing pharmacy to be a part of this patient's care.  Elmer Ramp 06/14/2022 10:53 AM

## 2022-06-18 NOTE — Progress Notes (Signed)
[facility] Progress Note Patient Name: Rachel Hobbs DOB: Jun 13, 1940 MRN: [id number]   Date of Service  06/05/2022  HPI/Events of Note  Patient is about to expire, she is a DNR, maxed on pressors.  eICU Interventions  CRRT discontinued.        Frederik Pear 05/23/2022, 8:39 PM

## 2022-06-18 NOTE — ED Notes (Signed)
Dr. Gwendlyn Deutscher at bedside

## 2022-06-18 NOTE — Progress Notes (Signed)
Patient became hypoxic, underwent wide-complex tachycardia followed by V-fib, she was shocked.  Due to severe hypotension she went into cardiac arrest, ROSC was achieved after 2 minutes, still she was in and out of V-fib received multiple shocks. She was given amiodarone bolus followed by amiodarone infusion Cardiology was consulted, she was started on lidocaine infusion after giving lidocaine bolus. She did 4 g of magnesium.  She was continued on vasopressor support.  Continue ongoing goals of care discussion    This patient is critically ill with multiple organ system failure which requires frequent high complexity decision making, assessment, support, evaluation, and titration of therapies. This was completed through the application of advanced monitoring technologies and extensive interpretation of multiple databases.  During this encounter critical care time was devoted to patient care services described in this note for 37 minutes.    Jacky Kindle, MD [facility] See Amion for pager If no response to pager, please call [phone] until 7pm After 7pm, Please call E-link [phone]

## 2022-06-18 NOTE — ED Notes (Signed)
Refusing to stay in bed. Pt displays difficulty sitting up and requires assistance for moving. Encouraged to remain in bed. Pillows adjusted and placed for comfort. Family member at bedside.

## 2022-06-18 NOTE — Consult Note (Addendum)
Advanced Heart Failure Team Consult Note   Primary Physician: Arlyce Dice, MD PCP-Cardiologist:  None  Reason for Consultation: Cardiogenic Shock   HPI:    Rachel Hobbs is seen today for evaluation of cardiogenic shock  at the request of Dr Percival Spanish.   Rachel Hobbs is a 82 year old with a history of ESRD, DMII, PAD, HTN, HFpEF, ballon angioplasty Left brachial artery 2023, and hernia.    Echo 2022 EF 60-65%   Yesterday she was in her USOH went to dialysis and developed acute dyspnea. She was on dialysis for ~ 2 hours. She was sent to the ED from dialysis center with increased shortness of breath. O2 sats 87% on arrival. HS Trop elevated. Cardiology consulted. EKG with no ischemic changes. CTA negative for PE. TSH 3.6, CO2 28, HS Trop [id number],  K 8.5 on arrival. . Echo severely reduced < 20% RV mildly reduced, and WMA anterior LAD territory.   Decompensated earlier today with increased shortness of breath and hypotension. This am CO2 16 and  Lactic acid 8.8>9.  HS Trop trending up 1390. CCM called for intubation + central access.   Nephrology consulted.     Review of Systems: [y] = yes, [ ]$  = no   General: Weight gain [ ]$ ; Weight loss [ ]$ ; Anorexia [ ]$ ; Fatigue [ ]$ ; Fever [ ]$ ; Chills [ ]$ ; Weakness [ ]$   Cardiac: Chest pain/pressure [ ]$ ; Resting SOB [ Y]; Exertional SOB [ ]$ ; Orthopnea [ ]$ ; Pedal Edema [ ]$ ; Palpitations [ ]$ ; Syncope [ ]$ ; Presyncope [ ]$ ; Paroxysmal nocturnal dyspnea[ ]$   Pulmonary: Cough [ ]$ ; Wheezing[ ]$ ; Hemoptysis[ ]$ ; Sputum [ ]$ ; Snoring [ ]$   GI: Vomiting[ ]$ ; Dysphagia[ ]$ ; Melena[ ]$ ; Hematochezia [ ]$ ; Heartburn[ ]$ ; Abdominal pain [ ]$ ; Constipation [ ]$ ; Diarrhea [ ]$ ; BRBPR [ ]$   GU: Hematuria[ ]$ ; Dysuria [ ]$ ; Nocturia[ ]$   Vascular: Pain in legs with walking [ ]$ ; Pain in feet with lying flat [ ]$ ; Non-healing sores [ ]$ ; Stroke [ ]$ ; TIA [ ]$ ; Slurred speech [ ]$ ;  Neuro: Headaches[ ]$ ; Vertigo[ ]$ ; Seizures[ ]$ ; Paresthesias[ ]$ ;Blurred vision [ ]$ ; Diplopia [ ]$ ; Vision changes [  ]  Ortho/Skin: Arthritis [ ]$ ; Joint pain [ Y]; Muscle pain [ ]$ ; Joint swelling [ ]$ ; Back Pain [ ]$ ; Rash [ ]$   Psych: Depression[ ]$ ; Anxiety[ ]$   Heme: Bleeding problems [ ]$ ; Clotting disorders [ ]$ ; Anemia [ ]$   Endocrine: Diabetes [ Y]; Thyroid dysfunction[ ]$   Home Medications Prior to Admission medications   Medication Sig Start Date End Date Taking? Authorizing Provider  acetaminophen (TYLENOL) 500 MG tablet Take 1,000 mg by mouth daily as needed for moderate pain.   Yes [provider]  aspirin 81 MG tablet Take 81 mg by mouth every other day.   Yes [provider]  atorvastatin (LIPITOR) 40 MG tablet TAKE 1 TABLET(40 MG) BY MOUTH DAILY AT 6 PM Strength: 40 mg 05/28/21  Yes Lurline Del, DO  calcium acetate (PHOSLO) 667 MG capsule Take 667-1,334 mg by mouth See admin instructions. Take 1334 mg with each meal and 667 mg with each snack 05/10/16  Yes [provider]  gabapentin (NEURONTIN) 100 MG capsule Take 100 mg by mouth 2 (two) times daily.   Yes [provider]  isosorbide mononitrate (IMDUR) 30 MG 24 hr tablet Take 30 mg by mouth daily. 03/29/21  Yes [provider]  LANTUS SOLOSTAR 100 UNIT/ML Solostar Pen Inject 42 Units into the  skin at bedtime. PLEASE MAKE AN APPOINTMENT TO SEE PCP Patient taking differently: Inject 40 Units into the skin at bedtime. PLEASE MAKE AN APPOINTMENT TO SEE PCP 12/25/21  Yes Darci Current, DO  Blood Glucose Monitoring Suppl [facility] VERIO) w/Device KIT Use daily as indicated 12/25/21   Darci Current, DO  clopidogrel (PLAVIX) 75 MG tablet Take 1 tablet (75 mg total) by mouth daily. Patient not taking: Reported on 06/06/2022 12/14/21 12/14/22  [name], MD  glucose blood [facility] VERIO) test strip Use as instructed 12/25/21   Darci Current, DO  Insulin Pen Needle 31G X 8 MM MISC BD UltraFine III Pen Needles. For use with insulin pen device. Inject insulin 2X daily 12/25/21   Darci Current, DO  Lancets [facility]  ULTRASOFT) lancets Once daily testing plus prn for hypoglycemia 12/25/21   Darci Current, DO  naloxone [facility]) nasal spray 4 mg/0.1 mL Use in both nostrils if signs of opioid OD Patient not taking: Reported on 05/25/2022 09/07/21   Kommor, Debe Coder, MD  NEEDLE, DISP, 30 G (B-D DISP NEEDLE 30GX1") 30G X 1" MISC 1 each by Does not apply route daily. 12/25/21   Darci Current, DO  OneTouch Delica Lancets [id number] MISC Use daily as indicated 12/25/21   Darci Current, DO    Past Medical History: Past Medical History:  Diagnosis Date   Anemia of renal disease 06/16/2006   Qualifier: Diagnosis of  By: Marinell Blight, Dawn     Arthritis    CHF (congestive heart failure) ([place])    COVID 2021   Depression    Diabetes mellitus    type 2   Hernia    Hyperlipidemia    Hypertension    Iron deficiency anemia    LEG EDEMA, BILATERAL 12/03/2008   Qualifier: Diagnosis of  By: Carlena Sax  MD, [place]     Low iron    Nodule of soft tissue 06/22/2012   Peripheral vascular disease ([place])    in legs   Pneumonia    teenager   Renal disorder    CKD - dialysis T/TH/Sa   Shortness of breath dyspnea    with exertion   SMALL BOWEL OBSTRUCTION, HX OF 02/15/2007   Qualifier: Diagnosis of  By: [name], HEIDI     Type 2 diabetes mellitus with diabetic peripheral angiopathy without gangrene ([place]) 12/16/2015    Past Surgical History: Past Surgical History:  Procedure Laterality Date   ABDOMINAL HYSTERECTOMY     in the 68's   AORTIC ARCH ANGIOGRAPHY N/A 12/14/2021   Procedure: AORTIC ARCH ANGIOGRAPHY;  Surgeon: [name], MD;  Location: [place] [facility];  Service: Cardiovascular;  Laterality: N/A;   AV FISTULA PLACEMENT Left 12/05/2015   Procedure: RADIOCEPHALIC VERSUS BRACHIOCEPHALIC ARTERIOVENOUS (AV) FISTULA CREATION;  Surgeon: [name], MD;  Location: [place];  Service: Vascular;  Laterality: Left;   AV FISTULA PLACEMENT Left 05/20/2017   Procedure: INSERTION OF ARTERIOVENOUS (AV) GORE-TEX VASCULAR  STRETCH 4-7 GRAFT ARM LEFT UPPER ARM;  Surgeon: [name], MD;  Location: [place];  Service: Vascular;  Laterality: Left;   AV FISTULA PLACEMENT Left 08/14/2021   Procedure: LEFT ARM ARTERIOVENOUS (AV) FISTULA CREATION;  Surgeon: [name], MD;  Location: [place];  Service: Vascular;  Laterality: Left;   [place] Left 06/04/2016   Procedure: FIRST STAGE BASILIC VEIN TRANSPOSITION;  Surgeon: [name], MD;  Location: [place];  Service: Vascular;  Laterality: Left;   [place] Left  08/04/2016   Procedure: LEFT UPPER ARM [place], SECOND STAGE;  Surgeon: [name], MD;  Location: [place];  Service: Vascular;  Laterality: Left;   HERNIA REPAIR     umbilical in the [id number]   INSERTION OF DIALYSIS CATHETER Right 12/05/2015   Procedure: INSERTION OF DIALYSIS CATHETER;  Surgeon: [name], MD;  Location: [place];  Service: Vascular;  Laterality: Right;   IR THROMBECTOMY AV FISTULA W/THROMBOLYSIS/PTA INC/SHUNT/IMG LEFT Left 05/09/2019   IR US GUIDE VASC ACCESS LEFT  05/09/2019   LIGATION OF COMPETING BRANCHES OF ARTERIOVENOUS FISTULA Left 09/04/2021   Procedure: LIGATION OF LEFT ARM CEPHALIC VEIN;  Surgeon: [name], MD;  Location: [place];  Service: Vascular;  Laterality: Left;   PERIPHERAL VASCULAR BALLOON ANGIOPLASTY  12/14/2021   Procedure: PERIPHERAL VASCULAR BALLOON ANGIOPLASTY;  Surgeon: [name], MD;  Location: [place] [facility];  Service: Cardiovascular;;   UPPER EXTREMITY ANGIOGRAPHY Left 12/14/2021   Procedure: Upper Extremity Angiography;  Surgeon: [name], MD;  Location: [place] [facility];  Service: Cardiovascular;  Laterality: Left;   UPPER EXTREMITY VENOGRAPHY Bilateral 06/15/2021   Procedure: UPPER EXTREMITY VENOGRAPHY;  Surgeon: [name], MD;  Location: [place] [facility];  Service: Cardiovascular;  Laterality: Bilateral;    Family  History: Family History  Problem Relation Age of Onset   Kidney disease Mother    Hypertension Mother    COPD Father        smoke   Cancer Father        Lung   Hypertension Father    Kidney disease Sister    Diabetes Daughter     Social History: Social History   Socioeconomic History   Marital status: Divorced    Spouse name: Not on file   Number of children: 1   Years of education: 12   Highest education level: 12th grade  Occupational History   Occupation: Retired- [employer]     Employer: RETIRED  Tobacco Use   Smoking status: Never    Passive exposure: Never   Smokeless tobacco: Never  Vaping Use   Vaping Use: Never used  Substance and Sexual Activity   Alcohol use: No   Drug use: No   Sexual activity: Not Currently  Other Topics Concern   Not on file  Social History Narrative   Patient lives with daughter [name])   Important Relationships Daughter   Education / Work:  13 th grade/ 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)    Interests / Fun: Sings in choir at [employer], going to [employer], and going to grandkids' games and school events    Current Stressors: Denies   Religious / Personal Beliefs: "I believe in God."         *Updated 03/2020*                                                                                                          Social Determinants of Health   Financial Resource Strain: [place]  (  04/16/2020)   Overall Financial Resource Strain (CARDIA)    Difficulty of Paying Living Expenses: Not hard at all  Food Insecurity: No Food Insecurity (04/16/2020)   Hunger Vital Sign    Worried About Running Out of Food in the Last Year: Never true    Ran Out of Food in the Last Year: Never true  Transportation Needs: No Transportation Needs (04/16/2020)   PRAPARE - Hydrologist (Medical): No    Lack of Transportation (Non-Medical): No  Physical Activity: Inactive (04/16/2020)   Exercise Vital Sign     Days of Exercise per Week: 0 days    Minutes of Exercise per Session: 0 min  Stress: No Stress Concern Present (04/16/2020)   [place]    Feeling of Stress : Not at all  Social Connections: Moderately Integrated (04/16/2020)   Social Connection and Isolation Panel [NHANES]    Frequency of Communication with Friends and Family: More than three times a week    Frequency of Social Gatherings with Friends and Family: More than three times a week    Attends Religious Services: More than 4 times per year    Active Member of Clubs or Organizations: Yes    Attends Music therapist: More than 4 times per year    Marital Status: Divorced    Allergies:  No Known Allergies  Objective:    Vital Signs:   Temp:  [97.5 F (36.4 C)-98.9 F (37.2 C)] 97.5 F (36.4 C) (02/21 0740) Pulse Rate:  [62-105] 84 (02/21 0815) Resp:  [13-34] 25 (02/21 1106) BP: (77-149)/(21-99) 89/21 (02/21 1106) SpO2:  [93 %-99 %] 98 % (02/21 0815) Weight:  [94.8 kg] 94.8 kg (02/20 1455)    Weight change: Filed Weights   06/02/2022 1455  Weight: 94.8 kg    Intake/Output:  No intake or output data in the 24 hours ending 06/07/2022 1154    Physical Exam    General:  Increased WOB/anxious.  TUnnled HD cath on R  HEENT: normal Neck: supple. JVP difficult to assess.  . Carotids 2+ bilat; no bruits. No lymphadenopathy or thyromegaly appreciated. Cor: PMI nondisplaced. Regular rate & rhythm. No rubs, gallops or murmurs. Lungs: clear Abdomen: soft, nontender, nondistended. No hepatosplenomegaly. No bruits or masses. Good bowel sounds. + hernia  Extremities: cool, no cyanosis, clubbing, rash, edema Neuro: alert & orientedx3, cranial nerves grossly intact. moves all 4 extremities w/o difficulty.   Telemetry   SB/SR 1st degree heart block  EKG    SR   Labs   Basic Metabolic Panel: Recent Labs  Lab 05/20/2022 1605 06/11/2022 0549  06/01/2022 0630 06/11/2022 1038  NA 134* 124* 133* 127*  K 4.5 >8.5* 5.5* 5.5*  CL 93*  --  91*  --   CO2 28  --  16*  --   GLUCOSE 98  --  252*  --   BUN 28*  --  38*  --   CREATININE 6.29*  --  8.10*  --   CALCIUM 8.7*  --  8.9  --     Liver Function Tests: No results for input(s): "AST", "ALT", "ALKPHOS", "BILITOT", "PROT", "ALBUMIN" in the last 168 hours. No results for input(s): "LIPASE", "AMYLASE" in the last 168 hours. No results for input(s): "AMMONIA" in the last 168 hours.  CBC: Recent Labs  Lab 06/13/2022 1605 05/23/2022 0548 05/25/2022 0549 06/07/2022 1038  WBC 6.4 16.4*  --   --  HGB 12.9 13.7 14.6 13.6  HCT 38.9 42.4 43.0 40.0  MCV 106.0* 109.8*  --   --   PLT 157 158  --   --     Cardiac Enzymes: No results for input(s): "CKTOTAL", "CKMB", "CKMBINDEX", "TROPONINI" in the last 168 hours.  BNP: BNP (last 3 results) Recent Labs    09/12/21 1700 05/19/22 1620  BNP 69.6 372.1*    ProBNP (last 3 results) No results for input(s): "PROBNP" in the last 8760 hours.   CBG: Recent Labs  Lab 06/11/2022 2021 05/21/2022 0736  GLUCAP 100* 211*    Coagulation Studies: No results for input(s): "LABPROT", "INR" in the last 72 hours.   Imaging   DG ABD ACUTE 2+V W 1V CHEST  Result Date: 06/01/2022 CLINICAL DATA:  [id number] dyspnea. Shortness of breath. CHF. Large abdominal hernia. EXAM: DG ABDOMEN ACUTE WITH 1 VIEW CHEST COMPARISON:  CT with oral contrast 01/15/2022, AP Lat chest yesterday at 3:26 p.m. FINDINGS: AP chest and supine and left lateral decubitus abdomen films, 6:35 a.m.: There is no evidence of dilated bowel loops or free intraperitoneal air. Large bowel containing left ventral abdominal wall hernia is again noted. There is a 9 mm gallstone again seen in the right upper abdomen. No radiopaque urinary calculi or other acute radiographic abnormality is seen, with again noted extensive vascular calcifications. There is mild cardiomegaly without evidence of CHF.  There is aortic atherosclerosis and slight tortuosity with stable mediastinum. Right IJ dialysis catheter terminates at about the superior cavoatrial junction. There is a small left pleural effusion developing with overlying left basilar opacity which could be atelectasis or consolidation. The remaining lungs remain clear. There are vascular stents in the left axilla. IMPRESSION: 1. No evidence of bowel obstruction or free intraperitoneal air. 2. Large bowel containing left ventral abdominal wall hernia. 3. Small left pleural effusion developing with overlying left basilar opacity which could be atelectasis or consolidation. 4. Mild cardiomegaly without evidence of CHF. 5. Aortic atherosclerosis. Electronically Signed   By: Telford Nab M.D.   On: 05/30/2022 07:15   CT Angio Chest Pulmonary Embolism (PE) W or WO Contrast  Result Date: 05/31/2022 CLINICAL DATA:  82 year old female with persistent left shoulder pain. EXAM: CT ANGIOGRAPHY CHEST WITH CONTRAST TECHNIQUE: Multidetector CT imaging of the chest was performed using the standard protocol during bolus administration of intravenous contrast. Multiplanar CT image reconstructions and MIPs were obtained to evaluate the vascular anatomy. RADIATION DOSE REDUCTION: This exam was performed according to the departmental dose-optimization program which includes automated exposure control, adjustment of the mA and/or kV according to patient size and/or use of iterative reconstruction technique. CONTRAST:  71m OMNIPAQUE IOHEXOL 350 MG/ML SOLN COMPARISON:  Chest and left shoulder radiographs yesterday. CT Abdomen and Pelvis 01/15/2022. FINDINGS: Cardiovascular: Excellent contrast bolus timing in the pulmonary arterial tree. Mild respiratory motion. No pulmonary artery filling defect. Calcified coronary artery and Calcified aortic atherosclerosis. With little to no contrast in the aorta. Mild contrast reflux into the hepatic veins and hepatic IVC. But only mild  cardiomegaly. No pericardial effusion. Right IJ approach dual lumen dialysis type catheter in place with evidence of right innominate venous stenosis and numerous enhancing right chest wall venous collaterals. Superimposed left axillary vascular grafts and/or stents, not evaluated as there is no left upper extremity vascular contrast present. Mediastinum/Nodes: Negative. No mediastinal mass or lymphadenopathy. Lungs/Pleura: Major airways are patent with mild atelectatic changes. Fairly symmetric dependent mild pulmonary opacity most resembles atelectasis. No convincing pleural effusion. No  pneumothorax, pulmonary edema, or convincing pulmonary inflammation. Upper Abdomen: Negative visible noncontrast upper abdomen except for some contrast reflux into the liver. Musculoskeletal: Partially visible bulky lower cervical spine endplate degeneration. T1 spina bifida occulta, normal variant. Preserved upper thoracic disc spaces. Lower thoracic vacuum disc degeneration. Left ribs appear intact. Visible left shoulder osseous structures are intact. Contralateral right shoulder more pronounced glenohumeral joint space loss and degeneration. No acute osseous abnormality identified. Review of the MIP images confirms the above findings. IMPRESSION: 1. Negative for acute pulmonary embolus. 2. Mild pulmonary atelectasis. No other acute finding in the Chest. 3. Right side central venous stenosis related to dialysis catheter. Vascular stents or grafts in the left upper extremity. Extensive coronary artery and Aortic Atherosclerosis (ICD10-I70.0). Electronically Signed   By: Genevie Ann M.D.   On: 05/29/2022 06:42   DG Chest 2 View  Result Date: 05/28/2022 CLINICAL DATA:  Left shoulder pain.  Neck crepitus. EXAM: CHEST - 2 VIEW COMPARISON:  Chest radiographs 04/20/2022 and 09/12/2021 FINDINGS: Right internal jugular dual-lumen central venous catheter tip again overlies the superior vena cava/right atrial junction. Cardiac silhouette  is moderately enlarged. Mediastinal contours within normal limits. Moderate calcification within the aortic arch. The lungs appear clear. No pleural effusion or pneumothorax is seen. Moderate multilevel degenerative disc changes of the thoracic spine. There are two sets of vascular stents partially visualized within the left upper arm. IMPRESSION: Stable cardiomegaly.  No acute lung process. Electronically Signed   By: Yvonne Kendall M.D.   On: 06/05/2022 16:03   DG Shoulder Left  Result Date: 06/06/2022 CLINICAL DATA:  Shoulder pain with neck crepitus EXAM: LEFT SHOULDER - 2+ VIEW COMPARISON:  None Available. FINDINGS: Moderate AC joint degenerative change. Mild to moderate glenohumeral degenerative change. No fracture or malalignment. Narrowed appearing subacromial space. Stents in the left upper extremity IMPRESSION: Degenerative changes of the [facility] and glenohumeral joints. Narrowed subacromial space suggesting rotator cuff pathology. No acute osseous abnormality Electronically Signed   By: Donavan Foil M.D.   On: 06/06/2022 15:58     Medications:     Current Medications:  acetaminophen  650 mg Oral Q6H   aspirin  81 mg Oral Daily   atorvastatin  40 mg Oral Daily   calcium acetate  1,334 mg Oral TID WC   clopidogrel  75 mg Oral Daily   diclofenac Sodium  2 g Topical QID    HYDROmorphone (DILAUDID) injection  0.5 mg Intravenous Once   insulin aspart  0-9 Units Subcutaneous TID WC   insulin glargine-yfgn  5 Units Subcutaneous QHS   lidocaine  1 patch Transdermal Q24H   oxymetazoline  1 spray Each Nare Once    Infusions:  sodium chloride     heparin 1,000 Units/hr (05/26/2022 1007)   norepinephrine (LEVOPHED) Adult infusion     piperacillin-tazobactam (ZOSYN)  IV     vancomycin        Patient Profile   Rachel Hobbs is a 82 year old with a history of ESRD, DMII, PAD, HTN, HFpEF, ballon angioplasty Left brachial artery 2023, and hernia.    Admit with cardiogenic  shock-->NSTEMI  Assessment/Plan   1. NSTEMI---> Cardiogenic Shock  -Lactic acid 8.8>9. HS Trop [phone]  - Previously preserved EF. Echo now with severely reduced EF < 20%.  with WMA abnormalities LAD.  - Decompensated this morning --> shock.  - On heparin drip. Add norepi as needed.  -Plan for urgent cath once intubated.   2. Acute Respiratory Failure - CTA negative  for PE.  -CCM consulted. Urgent intubation.   3. Hyperkalemia K 8.8 -->5.5   4. ESRD  Nephrology consulted   5. DMII   6. DJD    Length of Stay: 0  Darrick Grinder, NP  06/11/2022, 11:54 AM  Advanced Heart Failure Team Pager [phone] (M-F; 7a - 5p)  Please contact [facility] for night-coverage after hours (4p -7a ) and weekends on [email or web address]  Patient seen with NP, agree with the above note.    Patient reports dyspnea x 2 days, no chest pain.  She was sent to the ER from HD yesterday due to ongoing dyspnea not improved by HD.  Lactate elevated 8.8 => 9, HS-TnI 388 => 863 => 1390.  CTA chest with no PE.   SBP 90s-100s, was never started on pressors.   ECG showed sinus brady with IVCD, prior ECG with narrow QRS.   Echo was reviewed from today, EF 25% with LAD-territory WMAs, RV looks ok, IVC dilated. Prior echo with normal EF in 2022.   Patient very dyspneic in the ER, intubated in ER.   General: Dyspneic Neck: JVP 14-16, no thyromegaly or thyroid nodule.  Lungs: Clear to auscultation bilaterally with normal respiratory effort. CV: Nondisplaced PMI.  Heart regular S1/S2, no S3/S4, no murmur. Trace ankle edema.  No carotid bruit. Difficult to palpate pedal pulses.  Abdomen: Soft, nontender, no hepatosplenomegaly. There is an abdominal wall hernia.  Skin: Intact without lesions or rashes.  Neurologic: Alert and oriented x 3.  Psych: Normal affect. Extremities: No clubbing or cyanosis.  HEENT: Normal.   Cardiogenic shock with elevated lactate despite relatively preserved BP.  Suspect recent MI based  on echo though HS-TnI not markedly elevated.  ECG is changed with new IVCD.  - Patient intubated.  - Will proceed directly to cath lab for RHC/LHC/angiography possible intervention.  - With age and ESRD, patient is not candidate for advanced therapies.    Patient will need CVVH post-procedure.   Loralie Champagne 06/05/2022 12:59 PM

## 2022-06-18 DEATH — deceased

## 2022-06-24 MED FILL — Medication: Qty: 1 | Status: AC

## 2022-08-06 ENCOUNTER — Ambulatory Visit: Payer: Medicare Other | Admitting: Podiatry

## 2022-11-17 ENCOUNTER — Ambulatory Visit: Payer: Medicare Other | Admitting: Internal Medicine

## 2022-11-22 ENCOUNTER — Encounter (INDEPENDENT_AMBULATORY_CARE_PROVIDER_SITE_OTHER): Payer: Medicare Other | Admitting: Ophthalmology
